# Patient Record
Sex: Female | Born: 1935
Health system: Southern US, Community
[De-identification: ages and names within clinical notes are randomized; demographics above are authoritative.]

## PROBLEM LIST (undated history)

## (undated) DIAGNOSIS — E785 Hyperlipidemia, unspecified: Secondary | ICD-10-CM

## (undated) DIAGNOSIS — N329 Bladder disorder, unspecified: Secondary | ICD-10-CM

## (undated) DIAGNOSIS — F329 Major depressive disorder, single episode, unspecified: Secondary | ICD-10-CM

## (undated) DIAGNOSIS — D649 Anemia, unspecified: Secondary | ICD-10-CM

## (undated) DIAGNOSIS — F418 Other specified anxiety disorders: Secondary | ICD-10-CM

## (undated) DIAGNOSIS — G252 Other specified forms of tremor: Secondary | ICD-10-CM

## (undated) DIAGNOSIS — I8390 Asymptomatic varicose veins of unspecified lower extremity: Secondary | ICD-10-CM

## (undated) DIAGNOSIS — I2699 Other pulmonary embolism without acute cor pulmonale: Secondary | ICD-10-CM

## (undated) DIAGNOSIS — M109 Gout, unspecified: Secondary | ICD-10-CM

## (undated) DIAGNOSIS — Z860100 Personal history of colon polyps, unspecified: Secondary | ICD-10-CM

## (undated) DIAGNOSIS — R0600 Dyspnea, unspecified: Secondary | ICD-10-CM

## (undated) DIAGNOSIS — R079 Chest pain, unspecified: Secondary | ICD-10-CM

## (undated) DIAGNOSIS — E039 Hypothyroidism, unspecified: Secondary | ICD-10-CM

## (undated) DIAGNOSIS — K648 Other hemorrhoids: Secondary | ICD-10-CM

## (undated) DIAGNOSIS — H3530 Unspecified macular degeneration: Secondary | ICD-10-CM

## (undated) DIAGNOSIS — F411 Generalized anxiety disorder: Secondary | ICD-10-CM

## (undated) DIAGNOSIS — R609 Edema, unspecified: Secondary | ICD-10-CM

## (undated) DIAGNOSIS — G250 Essential tremor: Secondary | ICD-10-CM

## (undated) DIAGNOSIS — K219 Gastro-esophageal reflux disease without esophagitis: Secondary | ICD-10-CM

## (undated) DIAGNOSIS — I5022 Chronic systolic (congestive) heart failure: Secondary | ICD-10-CM

## (undated) DIAGNOSIS — K5792 Diverticulitis of intestine, part unspecified, without perforation or abscess without bleeding: Secondary | ICD-10-CM

## (undated) DIAGNOSIS — M1990 Unspecified osteoarthritis, unspecified site: Secondary | ICD-10-CM

## (undated) DIAGNOSIS — F3289 Other specified depressive episodes: Secondary | ICD-10-CM

## (undated) DIAGNOSIS — Z8711 Personal history of peptic ulcer disease: Secondary | ICD-10-CM

## (undated) DIAGNOSIS — F419 Anxiety disorder, unspecified: Secondary | ICD-10-CM

## (undated) DIAGNOSIS — I10 Essential (primary) hypertension: Secondary | ICD-10-CM

## (undated) DIAGNOSIS — I82409 Acute embolism and thrombosis of unspecified deep veins of unspecified lower extremity: Secondary | ICD-10-CM

## (undated) DIAGNOSIS — R7309 Other abnormal glucose: Secondary | ICD-10-CM

## (undated) HISTORY — DX: Other hemorrhoids: K64.8

## (undated) HISTORY — DX: Hyperlipidemia, unspecified: E78.5

## (undated) HISTORY — DX: Other specified depressive episodes: F32.89

## (undated) HISTORY — DX: Other specified forms of tremor: G25.0

## (undated) HISTORY — DX: Generalized anxiety disorder: F41.1

## (undated) HISTORY — DX: Major depressive disorder, single episode, unspecified: F32.9

## (undated) HISTORY — PX: CHOLECYSTECTOMY: SHX55

## (undated) HISTORY — DX: Diverticulitis of intestine, part unspecified, without perforation or abscess without bleeding: K57.92

## (undated) HISTORY — DX: Gastro-esophageal reflux disease without esophagitis: K21.9

## (undated) HISTORY — DX: Chronic systolic (congestive) heart failure: I50.22

## (undated) HISTORY — PX: KNEE ARTHROSCOPY: SHX127

## (undated) HISTORY — PX: NASAL SEPTUM SURGERY: SHX37

## (undated) HISTORY — DX: Unspecified osteoarthritis, unspecified site: M19.90

## (undated) HISTORY — PX: SHOULDER SURGERY: SHX246

## (undated) HISTORY — DX: Other pulmonary embolism without acute cor pulmonale: I26.99

## (undated) HISTORY — DX: Gout, unspecified: M10.9

## (undated) HISTORY — DX: Essential (primary) hypertension: I10

## (undated) HISTORY — DX: Edema, unspecified: R60.9

## (undated) HISTORY — DX: Asymptomatic varicose veins of unspecified lower extremity: I83.90

## (undated) HISTORY — PX: EYE SURGERY: SHX253

## (undated) HISTORY — PX: ABDOMINAL HYSTERECTOMY: SHX81

## (undated) HISTORY — DX: Personal history of colon polyps, unspecified: Z86.0100

## (undated) HISTORY — DX: Chest pain, unspecified: R07.9

## (undated) HISTORY — DX: Hypothyroidism, unspecified: E03.9

## (undated) HISTORY — DX: Essential tremor: G25.2

## (undated) HISTORY — DX: Personal history of peptic ulcer disease: Z87.11

## (undated) HISTORY — DX: Acute embolism and thrombosis of unspecified deep veins of unspecified lower extremity: I82.409

## (undated) HISTORY — DX: Personal history of colonic polyps: Z86.010

## (undated) HISTORY — DX: Anxiety disorder, unspecified: F41.9

## (undated) HISTORY — DX: Anemia, unspecified: D64.9

## (undated) HISTORY — DX: Other abnormal glucose: R73.09

## (undated) HISTORY — DX: Bladder disorder, unspecified: N32.9

## (undated) HISTORY — DX: Unspecified macular degeneration: H35.30

## (undated) HISTORY — DX: Other specified anxiety disorders: F41.8

## (undated) HISTORY — DX: Dyspnea, unspecified: R06.00

---

## 1999-05-24 ENCOUNTER — Encounter: Payer: Self-pay | Admitting: *Deleted

## 1999-05-24 ENCOUNTER — Emergency Department (HOSPITAL_COMMUNITY): Admission: EM | Admit: 1999-05-24 | Discharge: 1999-05-24 | Payer: Self-pay | Admitting: *Deleted

## 2001-02-11 ENCOUNTER — Other Ambulatory Visit: Admission: RE | Admit: 2001-02-11 | Discharge: 2001-02-11 | Payer: Self-pay | Admitting: Obstetrics and Gynecology

## 2003-01-19 ENCOUNTER — Encounter (INDEPENDENT_AMBULATORY_CARE_PROVIDER_SITE_OTHER): Payer: Self-pay | Admitting: Gastroenterology

## 2003-11-12 ENCOUNTER — Encounter: Payer: Self-pay | Admitting: Internal Medicine

## 2004-03-11 ENCOUNTER — Encounter: Admission: RE | Admit: 2004-03-11 | Discharge: 2004-03-11 | Payer: Self-pay | Admitting: Orthopedic Surgery

## 2004-09-01 ENCOUNTER — Ambulatory Visit: Payer: Self-pay | Admitting: Internal Medicine

## 2004-09-21 ENCOUNTER — Ambulatory Visit: Payer: Self-pay | Admitting: Internal Medicine

## 2004-10-10 ENCOUNTER — Ambulatory Visit: Payer: Self-pay | Admitting: Internal Medicine

## 2004-11-14 ENCOUNTER — Ambulatory Visit: Payer: Self-pay | Admitting: Internal Medicine

## 2004-12-01 ENCOUNTER — Other Ambulatory Visit: Admission: RE | Admit: 2004-12-01 | Discharge: 2004-12-01 | Payer: Self-pay | Admitting: Obstetrics and Gynecology

## 2004-12-11 ENCOUNTER — Ambulatory Visit: Payer: Self-pay | Admitting: Internal Medicine

## 2004-12-12 ENCOUNTER — Ambulatory Visit: Payer: Self-pay

## 2005-01-02 ENCOUNTER — Ambulatory Visit: Payer: Self-pay | Admitting: Internal Medicine

## 2005-01-09 ENCOUNTER — Ambulatory Visit: Payer: Self-pay | Admitting: Internal Medicine

## 2005-04-03 ENCOUNTER — Ambulatory Visit: Payer: Self-pay | Admitting: Internal Medicine

## 2005-04-06 ENCOUNTER — Ambulatory Visit: Payer: Self-pay | Admitting: Internal Medicine

## 2005-05-18 ENCOUNTER — Encounter: Admission: RE | Admit: 2005-05-18 | Discharge: 2005-06-28 | Payer: Self-pay | Admitting: Orthopedic Surgery

## 2005-06-27 ENCOUNTER — Ambulatory Visit: Payer: Self-pay | Admitting: Internal Medicine

## 2005-08-30 ENCOUNTER — Ambulatory Visit: Payer: Self-pay | Admitting: Internal Medicine

## 2006-02-25 ENCOUNTER — Ambulatory Visit: Payer: Self-pay | Admitting: Internal Medicine

## 2006-05-09 ENCOUNTER — Ambulatory Visit: Payer: Self-pay | Admitting: Internal Medicine

## 2006-05-31 ENCOUNTER — Ambulatory Visit: Payer: Self-pay | Admitting: Internal Medicine

## 2006-08-08 ENCOUNTER — Ambulatory Visit: Payer: Self-pay | Admitting: Internal Medicine

## 2006-08-29 ENCOUNTER — Ambulatory Visit: Payer: Self-pay | Admitting: Internal Medicine

## 2007-01-14 ENCOUNTER — Ambulatory Visit: Payer: Self-pay | Admitting: Internal Medicine

## 2007-06-02 ENCOUNTER — Encounter: Payer: Self-pay | Admitting: Internal Medicine

## 2007-06-02 DIAGNOSIS — Z860100 Personal history of colon polyps, unspecified: Secondary | ICD-10-CM | POA: Insufficient documentation

## 2007-06-02 DIAGNOSIS — F418 Other specified anxiety disorders: Secondary | ICD-10-CM

## 2007-06-02 DIAGNOSIS — F4323 Adjustment disorder with mixed anxiety and depressed mood: Secondary | ICD-10-CM

## 2007-06-02 DIAGNOSIS — Z8719 Personal history of other diseases of the digestive system: Secondary | ICD-10-CM

## 2007-06-02 DIAGNOSIS — K219 Gastro-esophageal reflux disease without esophagitis: Secondary | ICD-10-CM

## 2007-06-02 DIAGNOSIS — K5730 Diverticulosis of large intestine without perforation or abscess without bleeding: Secondary | ICD-10-CM | POA: Insufficient documentation

## 2007-06-02 DIAGNOSIS — E079 Disorder of thyroid, unspecified: Secondary | ICD-10-CM | POA: Insufficient documentation

## 2007-06-02 DIAGNOSIS — E785 Hyperlipidemia, unspecified: Secondary | ICD-10-CM | POA: Insufficient documentation

## 2007-06-02 DIAGNOSIS — I10 Essential (primary) hypertension: Secondary | ICD-10-CM

## 2007-06-02 DIAGNOSIS — E039 Hypothyroidism, unspecified: Secondary | ICD-10-CM

## 2007-06-02 DIAGNOSIS — Z8711 Personal history of peptic ulcer disease: Secondary | ICD-10-CM

## 2007-06-02 DIAGNOSIS — E782 Mixed hyperlipidemia: Secondary | ICD-10-CM

## 2007-06-02 DIAGNOSIS — M1990 Unspecified osteoarthritis, unspecified site: Secondary | ICD-10-CM | POA: Insufficient documentation

## 2007-06-02 HISTORY — DX: Other specified anxiety disorders: F41.8

## 2007-08-06 ENCOUNTER — Ambulatory Visit: Payer: Self-pay | Admitting: Internal Medicine

## 2007-08-06 LAB — CONVERTED CEMR LAB
ALT: 14 units/L (ref 0–35)
AST: 27 units/L (ref 0–37)
Albumin: 4.1 g/dL (ref 3.5–5.2)
Basophils Absolute: 0 10*3/uL (ref 0.0–0.1)
Cholesterol: 199 mg/dL (ref 0–200)
Eosinophils Absolute: 0.3 10*3/uL (ref 0.0–0.6)
Eosinophils Relative: 4.4 % (ref 0.0–5.0)
GFR calc Af Amer: 63 mL/min
GFR calc non Af Amer: 52 mL/min
Glucose, Bld: 110 mg/dL — ABNORMAL HIGH (ref 70–99)
HCT: 37.5 % (ref 36.0–46.0)
HDL: 42.7 mg/dL (ref >39.0)
Hgb A1c MFr Bld: 6 % (ref 4.6–6.0)
LDL Cholesterol: 129 mg/dL — ABNORMAL HIGH (ref 0–99)
Lymphocytes Relative: 28.6 % (ref 12.0–46.0)
MCHC: 35 g/dL (ref 30.0–36.0)
MCV: 92.3 fL (ref 78.0–100.0)
Neutro Abs: 4.1 10*3/uL (ref 1.4–7.7)
Neutrophils Relative %: 59.7 % (ref 43.0–77.0)
Platelets: 215 10*3/uL (ref 150–400)
Potassium: 4.8 meq/L (ref 3.5–5.1)
RBC: 4.06 M/uL (ref 3.87–5.11)
Sodium: 144 meq/L (ref 135–145)
TSH: 1.79 microintl units/mL (ref 0.35–5.50)
Total Bilirubin: 0.9 mg/dL (ref 0.3–1.2)
Total CHOL/HDL Ratio: 4.7
Triglycerides: 137 mg/dL (ref 0–149)

## 2007-09-11 ENCOUNTER — Emergency Department (HOSPITAL_COMMUNITY): Admission: EM | Admit: 2007-09-11 | Discharge: 2007-09-11 | Payer: Self-pay | Admitting: Family Medicine

## 2007-12-03 ENCOUNTER — Encounter: Payer: Self-pay | Admitting: Internal Medicine

## 2008-04-01 ENCOUNTER — Ambulatory Visit: Payer: Self-pay | Admitting: Internal Medicine

## 2008-04-01 DIAGNOSIS — M109 Gout, unspecified: Secondary | ICD-10-CM

## 2008-04-02 ENCOUNTER — Ambulatory Visit: Payer: Self-pay | Admitting: Internal Medicine

## 2008-04-28 ENCOUNTER — Telehealth: Payer: Self-pay | Admitting: Internal Medicine

## 2008-04-29 ENCOUNTER — Ambulatory Visit: Payer: Self-pay | Admitting: Internal Medicine

## 2008-04-29 LAB — CONVERTED CEMR LAB
ALT: 14 units/L (ref 0–35)
BUN: 16 mg/dL (ref 6–23)
CRP, High Sensitivity: 4 (ref 0.00–5.00)
Chloride: 103 meq/L (ref 96–112)
Cholesterol: 194 mg/dL (ref 0–200)
Glucose, Bld: 109 mg/dL — ABNORMAL HIGH (ref 70–99)
LDL Cholesterol: 124 mg/dL — ABNORMAL HIGH (ref 0–99)
Potassium: 4 meq/L (ref 3.5–5.1)
TSH: 2.39 microintl units/mL (ref 0.35–5.50)
Triglycerides: 118 mg/dL (ref 0–149)
Uric Acid, Serum: 7.3 mg/dL — ABNORMAL HIGH (ref 2.4–7.0)
VLDL: 24 mg/dL (ref 0–40)

## 2008-05-02 ENCOUNTER — Encounter: Payer: Self-pay | Admitting: Internal Medicine

## 2008-05-25 ENCOUNTER — Ambulatory Visit: Payer: Self-pay | Admitting: Internal Medicine

## 2008-07-29 ENCOUNTER — Ambulatory Visit: Payer: Self-pay | Admitting: Internal Medicine

## 2008-08-05 ENCOUNTER — Encounter: Payer: Self-pay | Admitting: Internal Medicine

## 2008-09-16 ENCOUNTER — Telehealth: Payer: Self-pay | Admitting: Internal Medicine

## 2008-09-16 ENCOUNTER — Ambulatory Visit: Payer: Self-pay | Admitting: Internal Medicine

## 2008-09-16 DIAGNOSIS — J018 Other acute sinusitis: Secondary | ICD-10-CM

## 2008-09-30 ENCOUNTER — Encounter: Payer: Self-pay | Admitting: Internal Medicine

## 2008-10-18 ENCOUNTER — Telehealth: Payer: Self-pay | Admitting: Internal Medicine

## 2008-10-26 ENCOUNTER — Ambulatory Visit: Payer: Self-pay | Admitting: Internal Medicine

## 2008-10-26 LAB — CONVERTED CEMR LAB
ALT: 15 units/L (ref 0–35)
LDL Cholesterol: 37 mg/dL (ref 0–99)
Total CHOL/HDL Ratio: 2
Triglycerides: 71 mg/dL (ref 0–149)
Uric Acid, Serum: 5.3 mg/dL (ref 2.4–7.0)
VLDL: 14 mg/dL (ref 0–40)

## 2008-10-29 ENCOUNTER — Ambulatory Visit: Payer: Self-pay | Admitting: Internal Medicine

## 2008-10-29 DIAGNOSIS — G250 Essential tremor: Secondary | ICD-10-CM

## 2008-10-29 DIAGNOSIS — G252 Other specified forms of tremor: Secondary | ICD-10-CM

## 2008-11-09 ENCOUNTER — Telehealth (INDEPENDENT_AMBULATORY_CARE_PROVIDER_SITE_OTHER): Payer: Self-pay | Admitting: *Deleted

## 2008-11-11 ENCOUNTER — Telehealth: Payer: Self-pay | Admitting: Internal Medicine

## 2008-11-29 ENCOUNTER — Ambulatory Visit: Payer: Self-pay | Admitting: Gastroenterology

## 2008-12-24 ENCOUNTER — Ambulatory Visit: Payer: Self-pay | Admitting: Gastroenterology

## 2008-12-24 ENCOUNTER — Encounter: Payer: Self-pay | Admitting: Gastroenterology

## 2008-12-27 ENCOUNTER — Encounter: Payer: Self-pay | Admitting: Gastroenterology

## 2009-02-03 ENCOUNTER — Encounter: Payer: Self-pay | Admitting: Gastroenterology

## 2009-02-04 ENCOUNTER — Encounter: Payer: Self-pay | Admitting: Gastroenterology

## 2009-02-28 ENCOUNTER — Ambulatory Visit: Payer: Self-pay | Admitting: Internal Medicine

## 2009-02-28 DIAGNOSIS — I8310 Varicose veins of unspecified lower extremity with inflammation: Secondary | ICD-10-CM | POA: Insufficient documentation

## 2009-02-28 DIAGNOSIS — E119 Type 2 diabetes mellitus without complications: Secondary | ICD-10-CM

## 2009-02-28 LAB — CONVERTED CEMR LAB
BUN: 15 mg/dL (ref 6–23)
Creatinine, Ser: 1 mg/dL (ref 0.4–1.2)
GFR calc non Af Amer: 57.43 mL/min (ref >60)
Hgb A1c MFr Bld: 6.1 % (ref 4.6–6.5)
TSH: 1.25 microintl units/mL (ref 0.35–5.50)

## 2009-03-01 ENCOUNTER — Encounter: Payer: Self-pay | Admitting: Internal Medicine

## 2009-03-09 ENCOUNTER — Ambulatory Visit: Payer: Self-pay | Admitting: Vascular Surgery

## 2009-04-28 ENCOUNTER — Ambulatory Visit: Payer: Self-pay | Admitting: Internal Medicine

## 2009-04-28 DIAGNOSIS — N951 Menopausal and female climacteric states: Secondary | ICD-10-CM

## 2009-04-28 DIAGNOSIS — H5310 Unspecified subjective visual disturbances: Secondary | ICD-10-CM | POA: Insufficient documentation

## 2009-05-10 ENCOUNTER — Ambulatory Visit: Payer: Self-pay

## 2009-05-10 ENCOUNTER — Encounter: Payer: Self-pay | Admitting: Internal Medicine

## 2009-05-24 DIAGNOSIS — G2581 Restless legs syndrome: Secondary | ICD-10-CM | POA: Insufficient documentation

## 2009-05-25 ENCOUNTER — Ambulatory Visit: Payer: Self-pay | Admitting: Cardiology

## 2009-05-25 ENCOUNTER — Encounter: Payer: Self-pay | Admitting: Cardiology

## 2009-05-31 ENCOUNTER — Telehealth (INDEPENDENT_AMBULATORY_CARE_PROVIDER_SITE_OTHER): Payer: Self-pay

## 2009-06-01 ENCOUNTER — Ambulatory Visit: Payer: Self-pay

## 2009-06-02 ENCOUNTER — Ambulatory Visit: Payer: Self-pay

## 2009-06-09 ENCOUNTER — Ambulatory Visit: Payer: Self-pay | Admitting: Internal Medicine

## 2009-06-09 DIAGNOSIS — K648 Other hemorrhoids: Secondary | ICD-10-CM | POA: Insufficient documentation

## 2009-06-23 ENCOUNTER — Ambulatory Visit: Payer: Self-pay | Admitting: Pulmonary Disease

## 2009-07-12 ENCOUNTER — Encounter: Payer: Self-pay | Admitting: Pulmonary Disease

## 2009-07-12 ENCOUNTER — Ambulatory Visit (HOSPITAL_BASED_OUTPATIENT_CLINIC_OR_DEPARTMENT_OTHER): Admission: RE | Admit: 2009-07-12 | Discharge: 2009-07-12 | Payer: Self-pay | Admitting: Pulmonary Disease

## 2009-07-15 ENCOUNTER — Ambulatory Visit: Payer: Self-pay | Admitting: Pulmonary Disease

## 2009-07-19 ENCOUNTER — Ambulatory Visit: Payer: Self-pay | Admitting: Pulmonary Disease

## 2009-07-26 ENCOUNTER — Inpatient Hospital Stay (HOSPITAL_COMMUNITY): Admission: EM | Admit: 2009-07-26 | Discharge: 2009-07-30 | Payer: Self-pay | Admitting: Internal Medicine

## 2009-07-26 ENCOUNTER — Encounter: Payer: Self-pay | Admitting: Emergency Medicine

## 2009-07-26 ENCOUNTER — Ambulatory Visit: Payer: Self-pay | Admitting: Internal Medicine

## 2009-07-26 ENCOUNTER — Ambulatory Visit (HOSPITAL_BASED_OUTPATIENT_CLINIC_OR_DEPARTMENT_OTHER): Admission: RE | Admit: 2009-07-26 | Discharge: 2009-07-26 | Payer: Self-pay | Admitting: Internal Medicine

## 2009-07-26 ENCOUNTER — Ambulatory Visit: Payer: Self-pay | Admitting: Radiology

## 2009-07-26 DIAGNOSIS — R109 Unspecified abdominal pain: Secondary | ICD-10-CM | POA: Insufficient documentation

## 2009-07-26 LAB — CONVERTED CEMR LAB
Bilirubin Urine: NEGATIVE
Nitrite: NEGATIVE
Protein, U semiquant: >=300
Specific Gravity, Urine: 1.015
WBC Urine, dipstick: NEGATIVE
pH: 7

## 2009-07-28 ENCOUNTER — Ambulatory Visit: Payer: Self-pay | Admitting: Vascular Surgery

## 2009-07-28 ENCOUNTER — Encounter (INDEPENDENT_AMBULATORY_CARE_PROVIDER_SITE_OTHER): Payer: Self-pay | Admitting: Internal Medicine

## 2009-07-28 ENCOUNTER — Encounter: Payer: Self-pay | Admitting: Internal Medicine

## 2009-07-31 ENCOUNTER — Telehealth: Payer: Self-pay | Admitting: Internal Medicine

## 2009-07-31 DIAGNOSIS — I2699 Other pulmonary embolism without acute cor pulmonale: Secondary | ICD-10-CM

## 2009-08-01 ENCOUNTER — Ambulatory Visit: Payer: Self-pay | Admitting: Internal Medicine

## 2009-08-01 DIAGNOSIS — M25569 Pain in unspecified knee: Secondary | ICD-10-CM | POA: Insufficient documentation

## 2009-08-01 LAB — CONVERTED CEMR LAB: INR: 2.7 — ABNORMAL HIGH (ref 0.0–1.5)

## 2009-08-02 ENCOUNTER — Ambulatory Visit: Payer: Self-pay | Admitting: Cardiology

## 2009-08-02 ENCOUNTER — Telehealth: Payer: Self-pay | Admitting: Internal Medicine

## 2009-08-02 LAB — CONVERTED CEMR LAB: POC INR: 4.8

## 2009-08-05 ENCOUNTER — Ambulatory Visit: Payer: Self-pay | Admitting: Pulmonary Disease

## 2009-08-05 DIAGNOSIS — I2699 Other pulmonary embolism without acute cor pulmonale: Secondary | ICD-10-CM | POA: Insufficient documentation

## 2009-08-08 ENCOUNTER — Ambulatory Visit: Payer: Self-pay | Admitting: Cardiology

## 2009-08-08 LAB — CONVERTED CEMR LAB: POC INR: 4.4

## 2009-08-15 ENCOUNTER — Ambulatory Visit: Payer: Self-pay | Admitting: Internal Medicine

## 2009-08-15 LAB — CONVERTED CEMR LAB: POC INR: 5

## 2009-08-22 ENCOUNTER — Ambulatory Visit: Payer: Self-pay | Admitting: Internal Medicine

## 2009-08-29 ENCOUNTER — Ambulatory Visit: Payer: Self-pay | Admitting: Cardiovascular Disease

## 2009-09-01 ENCOUNTER — Telehealth: Payer: Self-pay | Admitting: Internal Medicine

## 2009-09-01 ENCOUNTER — Ambulatory Visit: Payer: Self-pay | Admitting: Cardiovascular Disease

## 2009-09-12 ENCOUNTER — Ambulatory Visit: Payer: Self-pay | Admitting: Cardiology

## 2009-09-12 LAB — CONVERTED CEMR LAB: POC INR: 2.3

## 2009-10-04 ENCOUNTER — Ambulatory Visit: Payer: Self-pay | Admitting: Internal Medicine

## 2009-10-04 DIAGNOSIS — J209 Acute bronchitis, unspecified: Secondary | ICD-10-CM

## 2009-10-05 ENCOUNTER — Encounter: Payer: Self-pay | Admitting: Internal Medicine

## 2009-10-10 ENCOUNTER — Ambulatory Visit: Payer: Self-pay | Admitting: Cardiology

## 2009-10-11 ENCOUNTER — Telehealth: Payer: Self-pay | Admitting: Internal Medicine

## 2009-10-13 ENCOUNTER — Ambulatory Visit: Payer: Self-pay | Admitting: Internal Medicine

## 2009-10-13 ENCOUNTER — Ambulatory Visit: Payer: Self-pay | Admitting: Diagnostic Radiology

## 2009-10-13 ENCOUNTER — Ambulatory Visit (HOSPITAL_BASED_OUTPATIENT_CLINIC_OR_DEPARTMENT_OTHER): Admission: RE | Admit: 2009-10-13 | Discharge: 2009-10-13 | Payer: Self-pay | Admitting: Internal Medicine

## 2009-10-19 ENCOUNTER — Telehealth: Payer: Self-pay | Admitting: Internal Medicine

## 2009-11-09 ENCOUNTER — Ambulatory Visit: Payer: Self-pay | Admitting: Cardiology

## 2009-11-14 ENCOUNTER — Ambulatory Visit: Payer: Self-pay | Admitting: Diagnostic Radiology

## 2009-11-14 ENCOUNTER — Ambulatory Visit: Payer: Self-pay | Admitting: Internal Medicine

## 2009-11-14 ENCOUNTER — Ambulatory Visit (HOSPITAL_BASED_OUTPATIENT_CLINIC_OR_DEPARTMENT_OTHER): Admission: RE | Admit: 2009-11-14 | Discharge: 2009-11-14 | Payer: Self-pay | Admitting: Internal Medicine

## 2009-11-14 DIAGNOSIS — S2020XA Contusion of thorax, unspecified, initial encounter: Secondary | ICD-10-CM | POA: Insufficient documentation

## 2009-11-15 ENCOUNTER — Telehealth: Payer: Self-pay | Admitting: Internal Medicine

## 2009-11-15 ENCOUNTER — Encounter: Payer: Self-pay | Admitting: Internal Medicine

## 2009-12-01 ENCOUNTER — Telehealth: Payer: Self-pay | Admitting: Internal Medicine

## 2009-12-07 ENCOUNTER — Ambulatory Visit: Payer: Self-pay | Admitting: Cardiology

## 2009-12-08 ENCOUNTER — Ambulatory Visit: Payer: Self-pay | Admitting: Diagnostic Radiology

## 2009-12-08 ENCOUNTER — Ambulatory Visit: Payer: Self-pay | Admitting: Internal Medicine

## 2009-12-08 ENCOUNTER — Ambulatory Visit (HOSPITAL_BASED_OUTPATIENT_CLINIC_OR_DEPARTMENT_OTHER): Admission: RE | Admit: 2009-12-08 | Discharge: 2009-12-08 | Payer: Self-pay | Admitting: Internal Medicine

## 2009-12-08 DIAGNOSIS — R4589 Other symptoms and signs involving emotional state: Secondary | ICD-10-CM

## 2009-12-08 DIAGNOSIS — R197 Diarrhea, unspecified: Secondary | ICD-10-CM

## 2009-12-09 ENCOUNTER — Telehealth (INDEPENDENT_AMBULATORY_CARE_PROVIDER_SITE_OTHER): Payer: Self-pay | Admitting: *Deleted

## 2009-12-12 ENCOUNTER — Ambulatory Visit: Payer: Self-pay | Admitting: Cardiology

## 2009-12-12 LAB — CONVERTED CEMR LAB: POC INR: 1.8

## 2009-12-14 ENCOUNTER — Encounter: Payer: Self-pay | Admitting: Internal Medicine

## 2009-12-16 ENCOUNTER — Telehealth: Payer: Self-pay | Admitting: Internal Medicine

## 2009-12-20 ENCOUNTER — Encounter: Payer: Self-pay | Admitting: Internal Medicine

## 2009-12-20 ENCOUNTER — Ambulatory Visit: Payer: Self-pay | Admitting: Psychiatry

## 2009-12-21 ENCOUNTER — Ambulatory Visit: Payer: Self-pay | Admitting: Cardiology

## 2009-12-21 LAB — CONVERTED CEMR LAB: POC INR: 1.7

## 2010-01-04 ENCOUNTER — Ambulatory Visit: Payer: Self-pay | Admitting: Internal Medicine

## 2010-01-19 ENCOUNTER — Ambulatory Visit: Payer: Self-pay | Admitting: Internal Medicine

## 2010-01-19 DIAGNOSIS — G629 Polyneuropathy, unspecified: Secondary | ICD-10-CM

## 2010-01-19 LAB — CONVERTED CEMR LAB
AST: 27 units/L (ref 0–37)
Albumin: 4.1 g/dL (ref 3.5–5.2)
BUN: 19 mg/dL (ref 6–23)
Calcium: 9.8 mg/dL (ref 8.4–10.5)
Creatinine, Ser: 0.92 mg/dL (ref 0.40–1.20)
Glucose, Bld: 87 mg/dL (ref 70–99)
HCT: 33.8 % — ABNORMAL LOW (ref 36.0–46.0)
Hemoglobin: 11.1 g/dL — ABNORMAL LOW (ref 12.0–15.0)
Hgb A1c MFr Bld: 5.9 % (ref 4.6–6.1)
MCHC: 32.8 g/dL (ref 30.0–36.0)
MCV: 96.6 fL (ref 78.0–100.0)
RBC: 3.5 M/uL — ABNORMAL LOW (ref 3.87–5.11)
RDW: 14.9 % (ref 11.5–15.5)
TSH: 1.697 microintl units/mL (ref 0.350–4.500)
Total Bilirubin: 0.4 mg/dL (ref 0.3–1.2)
Vitamin B-12: 884 pg/mL (ref 211–911)

## 2010-01-20 ENCOUNTER — Telehealth: Payer: Self-pay | Admitting: Internal Medicine

## 2010-01-24 ENCOUNTER — Telehealth: Payer: Self-pay | Admitting: Internal Medicine

## 2010-01-25 ENCOUNTER — Ambulatory Visit: Payer: Self-pay | Admitting: Internal Medicine

## 2010-01-25 ENCOUNTER — Telehealth: Payer: Self-pay | Admitting: Internal Medicine

## 2010-01-25 LAB — CONVERTED CEMR LAB: POC INR: 1.4

## 2010-01-31 ENCOUNTER — Ambulatory Visit: Payer: Self-pay | Admitting: Internal Medicine

## 2010-01-31 LAB — CONVERTED CEMR LAB
OCCULT 1: NEGATIVE
OCCULT 4: NEGATIVE

## 2010-02-03 ENCOUNTER — Encounter: Payer: Self-pay | Admitting: Internal Medicine

## 2010-02-09 ENCOUNTER — Ambulatory Visit: Payer: Self-pay | Admitting: Pulmonary Disease

## 2010-02-09 ENCOUNTER — Telehealth: Payer: Self-pay | Admitting: Internal Medicine

## 2010-02-10 ENCOUNTER — Ambulatory Visit: Payer: Self-pay | Admitting: Internal Medicine

## 2010-02-24 ENCOUNTER — Ambulatory Visit: Payer: Self-pay | Admitting: Internal Medicine

## 2010-02-24 LAB — CONVERTED CEMR LAB: POC INR: 1.6

## 2010-03-10 ENCOUNTER — Ambulatory Visit: Payer: Self-pay | Admitting: Cardiology

## 2010-03-24 ENCOUNTER — Ambulatory Visit: Payer: Self-pay | Admitting: Cardiology

## 2010-03-24 LAB — CONVERTED CEMR LAB: POC INR: 1.8

## 2010-04-06 ENCOUNTER — Encounter: Payer: Self-pay | Admitting: Internal Medicine

## 2010-04-07 ENCOUNTER — Ambulatory Visit: Payer: Self-pay | Admitting: Cardiology

## 2010-04-07 LAB — CONVERTED CEMR LAB: POC INR: 2.2

## 2010-05-01 ENCOUNTER — Ambulatory Visit: Payer: Self-pay | Admitting: Cardiology

## 2010-05-01 LAB — CONVERTED CEMR LAB: POC INR: 3.2

## 2010-05-03 ENCOUNTER — Encounter: Payer: Self-pay | Admitting: Internal Medicine

## 2010-05-09 ENCOUNTER — Encounter: Payer: Self-pay | Admitting: Internal Medicine

## 2010-05-09 DIAGNOSIS — I739 Peripheral vascular disease, unspecified: Secondary | ICD-10-CM

## 2010-05-10 ENCOUNTER — Ambulatory Visit: Payer: Self-pay

## 2010-05-10 ENCOUNTER — Encounter: Payer: Self-pay | Admitting: Internal Medicine

## 2010-05-19 ENCOUNTER — Ambulatory Visit: Payer: Self-pay | Admitting: Cardiology

## 2010-06-02 ENCOUNTER — Ambulatory Visit: Payer: Self-pay | Admitting: Cardiology

## 2010-06-02 LAB — CONVERTED CEMR LAB: POC INR: 5.4

## 2010-06-09 ENCOUNTER — Ambulatory Visit: Payer: Self-pay | Admitting: Cardiology

## 2010-06-09 LAB — CONVERTED CEMR LAB: POC INR: 2.1

## 2010-06-21 ENCOUNTER — Encounter: Payer: Self-pay | Admitting: Internal Medicine

## 2010-06-23 ENCOUNTER — Ambulatory Visit: Payer: Self-pay | Admitting: Internal Medicine

## 2010-06-23 LAB — CONVERTED CEMR LAB: POC INR: 2.9

## 2010-07-18 ENCOUNTER — Ambulatory Visit: Payer: Self-pay | Admitting: Internal Medicine

## 2010-07-21 ENCOUNTER — Encounter: Payer: Self-pay | Admitting: Cardiology

## 2010-07-21 ENCOUNTER — Ambulatory Visit: Payer: Self-pay | Admitting: Internal Medicine

## 2010-07-24 LAB — CONVERTED CEMR LAB
BUN: 17 mg/dL
CO2: 26 meq/L
Calcium: 9.5 mg/dL
Chloride: 103 meq/L
Creatinine, Ser: 1.02 mg/dL
Glucose, Bld: 98 mg/dL
Hgb A1c MFr Bld: 6 % — ABNORMAL HIGH
Potassium: 4.2 meq/L
Sodium: 142 meq/L
TSH: 1.451 u[IU]/mL

## 2010-07-31 LAB — CONVERTED CEMR LAB: Pap Smear: NORMAL

## 2010-09-20 ENCOUNTER — Encounter: Payer: Self-pay | Admitting: Internal Medicine

## 2010-10-19 ENCOUNTER — Ambulatory Visit (HOSPITAL_BASED_OUTPATIENT_CLINIC_OR_DEPARTMENT_OTHER)
Admission: RE | Admit: 2010-10-19 | Discharge: 2010-10-19 | Payer: Self-pay | Source: Home / Self Care | Admitting: Internal Medicine

## 2010-10-19 ENCOUNTER — Encounter: Payer: Self-pay | Admitting: Internal Medicine

## 2010-10-19 ENCOUNTER — Telehealth: Payer: Self-pay | Admitting: Internal Medicine

## 2010-10-19 ENCOUNTER — Ambulatory Visit: Payer: Self-pay | Admitting: Internal Medicine

## 2010-10-19 DIAGNOSIS — H8110 Benign paroxysmal vertigo, unspecified ear: Secondary | ICD-10-CM

## 2010-10-19 DIAGNOSIS — D649 Anemia, unspecified: Secondary | ICD-10-CM

## 2010-10-19 DIAGNOSIS — J019 Acute sinusitis, unspecified: Secondary | ICD-10-CM

## 2010-10-19 LAB — CONVERTED CEMR LAB
BUN: 21 mg/dL (ref 6–23)
Calcium: 10.6 mg/dL — ABNORMAL HIGH (ref 8.4–10.5)
Creatinine, Ser: 1.02 mg/dL (ref 0.40–1.20)
HCT: 40.8 % (ref 36.0–46.0)
Hgb A1c MFr Bld: 5.8 % — ABNORMAL HIGH (ref <5.7)
MCV: 95.8 fL (ref 78.0–100.0)
Platelets: 249 10*3/uL (ref 150–400)
RDW: 13.1 % (ref 11.5–15.5)
TSH: 1.987 microintl units/mL (ref 0.350–4.500)

## 2010-10-20 ENCOUNTER — Ambulatory Visit: Payer: Self-pay | Admitting: Internal Medicine

## 2010-10-20 ENCOUNTER — Telehealth: Payer: Self-pay | Admitting: Internal Medicine

## 2010-10-20 ENCOUNTER — Encounter: Payer: Self-pay | Admitting: Family Medicine

## 2010-10-27 ENCOUNTER — Ambulatory Visit: Payer: Self-pay | Admitting: Family Medicine

## 2010-10-28 ENCOUNTER — Ambulatory Visit (HOSPITAL_BASED_OUTPATIENT_CLINIC_OR_DEPARTMENT_OTHER)
Admission: RE | Admit: 2010-10-28 | Discharge: 2010-10-28 | Payer: Self-pay | Source: Home / Self Care | Attending: Family Medicine | Admitting: Family Medicine

## 2010-11-07 ENCOUNTER — Ambulatory Visit
Admission: RE | Admit: 2010-11-07 | Discharge: 2010-11-07 | Payer: Self-pay | Source: Home / Self Care | Attending: Family | Admitting: Family

## 2010-11-07 DIAGNOSIS — N390 Urinary tract infection, site not specified: Secondary | ICD-10-CM | POA: Insufficient documentation

## 2010-11-07 DIAGNOSIS — N329 Bladder disorder, unspecified: Secondary | ICD-10-CM | POA: Insufficient documentation

## 2010-11-07 LAB — CONVERTED CEMR LAB
Bilirubin Urine: NEGATIVE
Glucose, Urine, Semiquant: NEGATIVE
Ketones, urine, test strip: NEGATIVE
Specific Gravity, Urine: 1.01
pH: 6.5

## 2010-11-20 ENCOUNTER — Encounter
Admission: RE | Admit: 2010-11-20 | Discharge: 2010-12-12 | Payer: Self-pay | Source: Home / Self Care | Attending: Internal Medicine | Admitting: Internal Medicine

## 2010-11-29 ENCOUNTER — Telehealth: Payer: Self-pay | Admitting: Internal Medicine

## 2010-11-30 ENCOUNTER — Encounter: Payer: Self-pay | Admitting: Internal Medicine

## 2010-12-12 NOTE — Letter (Signed)
Summary: [facility]  [facility]   Imported By: Lanelle Bal 07/05/2010 10:12:28  _____________________________________________________________________  External Attachment:    Type:   Image     Comment:   External Document

## 2010-12-12 NOTE — Letter (Signed)
Summary: [facility]  [facility]   Imported By: Lanelle Bal 06/09/2010 13:27:47  _____________________________________________________________________  External Attachment:    Type:   Image     Comment:   External Document

## 2010-12-12 NOTE — Assessment & Plan Note (Signed)
Summary: 6 month follow up-jwr   Visit Type:  Follow-up Copy to:  Dr. Artist Pais Primary Provider/Referring Provider:  [name] DO  CC:  Pt here for 6 month follow up.  History of Present Illness: [name], ex smoker  for FU of PE 9/10 & restless legs. PSG >> RERAs are significant but does not meet AHI criteria for intervention. Significant PLM arousal index of 22/h , this corelates with her daytime h/s/o restless legs.  August 05, 2009  Admitted for RLL PE after air trip to [place]. Now on coumadin. Ropinirole helped. RT lower chest pain is impoved. C/o LLE nagging sensation, no swelling, duplex neg. Breathing OK.  18 lb wt gain over last few yr, 2 cups coffee daily.  2010-03-08 3:08 PM  daughter died of aneurysm December 19, 2022, on alprazolam Saw neurologist due to tremors - on primidone c/o dizzy - wonders if due to effexor Has increased energy now Hypercoagulable wu reviewed >> prot C, prot S neg,homocysteine nml, factor V neg, lupus anticoag POS   Current Medications (verified): 1)  Prevacid 30 Mg  Cpdr (Lansoprazole) .... Take 1 Tablet By Mouth Once A Day 2)  Furosemide 40 Mg  Tabs (Furosemide) .... Take 1 Tablet By Mouth Once A Day As Directed 3)  Synthroid 50 Mcg  Tabs (Levothyroxine Sodium) .... Take 1 Tablet By Mouth Once A Day 4)  Toprol Xl 50 Mg  Tb24 (Metoprolol Succinate) .... Take 1 Tablet By Mouth Once A Day 5)  Cozaar 100 Mg  Tabs (Losartan Potassium) .... Take 1 Tablet By Mouth Once A Day 6)  Astelin 137 Mcg/spray  Soln (Azelastine Hcl) .... One Spray Each Nostril Once Daily As Needed 7)  Simvastatin 40 Mg Tabs (Simvastatin) .... One By Mouth Qhs 8)  Uloric 40 Mg Tabs (Febuxostat) .... One By Mouth Once Daily 9)  Effexor Xr 75 Mg Xr24h-Cap (Venlafaxine Hcl) .... Take 1 Capsule By Mouth Once A Day 10)  Calcium Carbonate-Vitamin D 600-400 Mg-Unit  Tabs (Calcium Carbonate-Vitamin D) .... Take 1 Tablet By Mouth Once A Day 11)  Colchicine 0.6 Mg Tabs (Colchicine) .... As  Needed 12)  Vitamin D 2.000 Units .... Once Daily 13)  Pataday 0.2 % Soln (Olopatadine Hcl) .... One Drop Each Eye Once Daily 14)  Mucinex Dm Maximum Strength 60-1200 Mg Xr12h-Tab (Dextromethorphan-Guaifenesin) .... Take 1 Tablet By Mouth Two Times A Day As Needed 15)  Senokot S 8.6-50 Mg Tabs (Sennosides-Docusate Sodium) .... Take 1-2 Tablets By Mouth Two Times A Day As Needed 16)  Warfarin Sodium 5 Mg Tabs (Warfarin Sodium) .... Use As Directed By Anticoagulation Clinic 17)  Dulera 200-5 Mcg/act Aero (Mometasone Furo-Formoterol Fum) .... 2 Puffs Two Times A Day As Needed 18)  Alprazolam 0.25 Mg Tabs (Alprazolam) .... One Tab By Mouth Bid As Needed 19)  Gabapentin 100 Mg Caps (Gabapentin) .... One To Two Caps One Daily At Bedtime 20)  Primidone 50 Mg Tabs (Primidone) .... Take 1 Tablet By Mouth Two Times A Day  Allergies (verified): No Known Drug Allergies  Past History:  Past Medical History: Last updated: 01/19/2010 Current Problems:  HYPERTENSION (ICD-401.9) HYPOTHYROIDISM (ICD-244.9)   HYPERLIPIDEMIA (ICD-272.4) ? H/O CAD (cath in 2001)     HOT FLASHES (ICD-627.2)  DYSPNEA ON EXERTION (ICD-786.09)  UNSPECIFIED SUBJECTIVE VISUAL DISTURBANCE (ICD-368.10) VARICOSE VEINS LOWER EXTREMITIES W/INFLAMMATION (ICD-454.1) TREMOR, ESSENTIAL (ICD-333.1) RHINOSINUSITIS, ACUTE (ICD-461.8) GOUT (ICD-274.9) OSTEOARTHRITIS, HX OF (ICD-V13.5) HX, PERSONAL, PEPTIC ULCER DISEASE (ICD-V12.71) DIVERTICULOSIS, COLON (ICD-562.10) ANXIETY (ICD-300.00) COLONIC POLYPS, HX OF (  ICD-V12.72) GERD (ICD-530.81) DIVERTICULITIS, HX OF (ICD-V12.79) DEPRESSION (ICD-311) DIABETES MELLITUS, TYPE II, BORDERLINE (ICD-790.29) RESTLESS LEG SYNDROME (ICD-333.94) hyperplastic colon polyp, 2004; another colon polyp removed 2009 by Dr. in [place] unclear pathology Pulomonary Embolism- 07/2009  Social History: Last updated: 01/19/2010 Single Widowed     Alcohol use-yes     Tobacco Use - Former, started age  36, quit 1990, max 1 PPD     Review of Systems  The patient denies anorexia, fever, weight loss, weight gain, vision loss, decreased hearing, hoarseness, chest pain, syncope, dyspnea on exertion, peripheral edema, prolonged cough, headaches, hemoptysis, abdominal pain, melena, hematochezia, severe indigestion/heartburn, hematuria, muscle weakness, difficulty walking, depression, unusual weight change, and abnormal bleeding.    Vital Signs:  Patient profile:   77 year old female O2 Sat:      94 % on Room air Temp:     98.2 degrees F oral Pulse rate:   76 / minute BP sitting:   140 / 70  (left arm) Cuff size:   large  Vitals Entered By: Zackery Barefoot CMA (February 09, 2010 2:52 PM)  O2 Flow:  Room air CC: Pt here for 6 month follow up Comments Medications reviewed with patient Verified contact number and pharmacy with patient Zackery Barefoot CMA  February 09, 2010 2:53 PM    Physical Exam  Additional Exam:  Gen. Pleasant, well-nourished, in no distress ENT - no lesions, no post nasal drip Neck: No JVD, no thyromegaly, no carotid bruits Lungs: no use of accessory muscles, no dullness to percussion, clear without rales or rhonchi  Cardiovascular: Rhythm regular, heart sounds  normal, no murmurs or gallops, no peripheral edema Musculoskeletal: No deformities, no cyanosis or clubbing      Impression & Recommendations:  Problem # 1:  PULMONARY EMBOLISM (ICD-415.1)  Unclear what the optimal duration of anticoagulation is for her. Seemed idiopathic at onset except for plane ride from [place] , POS lupus anticoagulant noted. Wil ct coumadin for 12 months & then reassess. one approach would be tot ake her off coumadin x 1 month & chk D-dimer & rechk lupus anticoagulant. Her updated medication list for this problem includes:    Warfarin Sodium 5 Mg Tabs (Warfarin sodium) ..... Use as directed by anticoagulation clinic  Orders: Est. Patient Level III (82956)  Problem # 2:  RESTLESS LEG  SYNDROME (ICD-333.94)  ct requip  Orders: Est. Patient Level III (21308)  Problem # 3:  STRESS REACTION, ACUTE, WITH EMOTIONAL DISTURBANCE (ICD-308.0)  Wonders if effexor could be causing dizziness. have asked her to stop this & d/w Dr Artist Pais.  Orders: Est. Patient Level III (65784)  Medications Added to Medication List This Visit: 1)  Prevacid 30 Mg Cpdr (Lansoprazole) .... Take 1 tablet by mouth once a day 2)  Dulera 200-5 Mcg/act Aero (Mometasone furo-formoterol fum) .... 2 puffs two times a day as needed 3)  Primidone 50 Mg Tabs (Primidone) .... Take 1 tablet by mouth two times a day  Patient Instructions: 1)  Copy sent to: Dr Artist Pais. 2)  Stay on coumadin another 6months 3)  Please schedule a follow-up appointment in 6 months to reassess

## 2010-12-12 NOTE — Medication Information (Signed)
Summary: rov/tm  Anticoagulant Therapy  Managed by: Bethena Midget, RN, BSN Referring MD: Dr. Velta Addison PCP: [name] DO Supervising MD: Graciela Husbands MD, Viviann Spare Indication 1: Pulmonary Embolism Lab Used: [facility]  Site: [address] INR POC 1.6 INR RANGE 2-3  Dietary changes: no    Health status changes: no    Bleeding/hemorrhagic complications: no    Recent/future hospitalizations: no    Any changes in medication regimen? no    Recent/future dental: no  Any missed doses?: no       Is patient compliant with meds? yes       Allergies: No Known Drug Allergies  Anticoagulation Management History:      The patient is taking warfarin and comes in today for a routine follow up visit.  Positive risk factors for bleeding include an age of 74 years or older.  The bleeding index is 'intermediate risk'.  Positive CHADS2 values include History of HTN and Age > 84 years old.  Her last INR was 2.7.  Anticoagulation responsible provider: Graciela Husbands MD, Viviann Spare.  INR POC: 1.6.  Cuvette Lot#: [id number].  Exp: 03/2011.    Anticoagulation Management Assessment/Plan:      The patient's current anticoagulation dose is Warfarin sodium 5 mg tabs: Use as directed by Anticoagulation Clinic.  The target INR is 2.0-3.0.  The next INR is due 03/10/2010.  Anticoagulation instructions were given to patient.  Results were reviewed/authorized by Bethena Midget, RN, BSN.  She was notified by Bethena Midget, RN, BSN.         Prior Anticoagulation Instructions: INR 1.3 Today take 1 1/2 pills then change dose to 1/2 pill everyday except 1 pill on Mondays, Wednesdays and Fridays. Recheck in 2 weeks.  Current Anticoagulation Instructions: INR 1.6 Today take 7.5mg s then change dose to 5mg s daily except 2.5mg s on T, T& Sun. Recheck in 2 weeks.

## 2010-12-12 NOTE — Medication Information (Signed)
Summary: rov/tm  Anticoagulant Therapy  Managed by: Bethena Midget, RN Referring MD: Dr. Velta Addison PCP: [name] DO Supervising MD: Juanda Chance MD,  [place] Indication 1: Pulmonary Embolism Lab Used: [facility] [place] Site: [address] INR POC 4.0 INR RANGE 2-3  Dietary changes: yes       Details: Has been unable to eat regular meals lately due to family circumstances.  Health status changes: no    Bleeding/hemorrhagic complications: no    Recent/future hospitalizations: no    Any changes in medication regimen? no    Recent/future dental: no  Any missed doses?: no       Is patient compliant with meds? yes       Allergies: No Known Drug Allergies  Anticoagulation Management History:      The patient is taking warfarin and comes in today for a routine follow up visit.  Positive risk factors for bleeding include an age of 30 years or older.  The bleeding index is 'intermediate risk'.  Positive CHADS2 values include History of HTN and Age > 46 years old.  Her last INR was 2.7.  Anticoagulation responsible provider: Juanda Chance MD, Smitty Cords.  INR POC: 4.0.  Cuvette Lot#: [id number].  Exp: 02/2011.    Anticoagulation Management Assessment/Plan:      The patient's current anticoagulation dose is Warfarin sodium 5 mg tabs: Use as directed by Anticoagulation Clinic.  The target INR is 2.0-3.0.  The next INR is due 12/21/2009.  Anticoagulation instructions were given to patient.  Results were reviewed/authorized by Bethena Midget, RN.  She was notified by Lew Dawes, PharmD Candidate.         Prior Anticoagulation Instructions: INR 2.5 Continue 1/2 pill everyday except 1 pill on Mondays. Recheck in 4 weeks.   Current Anticoagulation Instructions: INR 4.0  Skip today's dose then continue current dose of 0.5 tablet daily except 1 tablet on Mondays. Recheck in 2 weeks.

## 2010-12-12 NOTE — Progress Notes (Signed)
Summary: order for stool sample card  Phone Note From Other Clinic   Caller: [facility] Call For: Christina Mckinney  Summary of Call: she came to the lab with stool sample card.  they need an order to process please fax them one  Initial call taken by: Roselle Locus,  January 24, 2010 2:46 PM  Follow-up for Phone Call        order printed ant faxed to [facility] Follow-up by: Glendell Docker CMA,  January 24, 2010 3:43 PM

## 2010-12-12 NOTE — Op Note (Signed)
Summary: Office Procedure Consent  Office Procedure Consent   Imported By: Darius Bump 10/20/2010 16:39:03  _____________________________________________________________________  External Attachment:    Type:   Image     Comment:   External Document

## 2010-12-12 NOTE — Medication Information (Signed)
Summary: rov/tm  Anticoagulant Therapy  Managed by: Bethena Midget, RN Referring MD: Dr. Velta Addison PCP: [name] DO Supervising MD: Shirlee Latch MD, Durant Scibilia Indication 1: Pulmonary Embolism Lab Used: [facility] [place] Site: [address] INR POC 1.8 INR RANGE 2-3  Dietary changes: yes       Details: Did eat extra leafy green vegetables  Health status changes: no    Bleeding/hemorrhagic complications: no    Recent/future hospitalizations: no    Any changes in medication regimen? yes       Details: Flagyl  TID until 12/19/09   Recent/future dental: no  Any missed doses?: yes     Details: Did skip Saturdays dose per our instruction  Is patient compliant with meds? yes       Allergies: No Known Drug Allergies  Anticoagulation Management History:      The patient is taking warfarin and comes in today for a routine follow up visit.  Positive risk factors for bleeding include an age of 75 years or older.  The bleeding index is 'intermediate risk'.  Positive CHADS2 values include History of HTN and Age > 75 years old.  Her last INR was 2.7.  Anticoagulation responsible provider: Shirlee Latch MD, Caia Lofaro.  INR POC: 1.8.  Cuvette Lot#: [id number].  Exp: 02//2012.    Anticoagulation Management Assessment/Plan:      The patient's current anticoagulation dose is Warfarin sodium 5 mg tabs: Use as directed by Anticoagulation Clinic.  The target INR is 2.0-3.0.  The next INR is due 12/21/2009.  Anticoagulation instructions were given to patient.  Results were reviewed/authorized by Bethena Midget, RN.  She was notified by Bethena Midget, RN, BSN.         Prior Anticoagulation Instructions: INR 4.0  Skip today's dose then continue current dose of 0.5 tablet daily except 1 tablet on Mondays. Recheck in 2 weeks.  Current Anticoagulation Instructions: INR 1.8 Today take 1/2 pill then take 1/2 pill everyday until next visit due to Antibiotic. Recheck on 12/21/09 at 145pm

## 2010-12-12 NOTE — Medication Information (Signed)
Summary: rov/tm  Anticoagulant Therapy  Managed by: Weston Brass, PharmD Referring MD: Dr. Velta Addison PCP: [name] DO Supervising MD: Antoine Poche MD, Fayrene Fearing Indication 1: Pulmonary Embolism Lab Used: [facility] [place] Site: [address] INR POC 1.8 INR RANGE 2-3  Dietary changes: no    Health status changes: no    Bleeding/hemorrhagic complications: no    Recent/future hospitalizations: no    Any changes in medication regimen? no    Recent/future dental: no  Any missed doses?: no       Is patient compliant with meds? yes       Allergies: No Known Drug Allergies  Anticoagulation Management History:      The patient is taking warfarin and comes in today for a routine follow up visit.  Positive risk factors for bleeding include an age of 22 years or older.  The bleeding index is 'intermediate risk'.  Positive CHADS2 values include History of HTN and Age > 33 years old.  Her last INR was 2.7.  Anticoagulation responsible provider: Antoine Poche MD, Fayrene Fearing.  INR POC: 1.8.  Cuvette Lot#: [id number].  Exp: 06/2011.    Anticoagulation Management Assessment/Plan:      The patient's current anticoagulation dose is Warfarin sodium 5 mg tabs: Use as directed by Anticoagulation Clinic.  The target INR is 2.0-3.0.  The next INR is due 04/07/2010.  Anticoagulation instructions were given to patient.  Results were reviewed/authorized by Weston Brass, PharmD.  She was notified by Weston Brass PharmD.         Prior Anticoagulation Instructions: INR 1.7 Today take 7.5mg s then change dose to 5mg s daily except 2.5mg s on Tuesdays and Thursdays . Recheck in 2 weeks.   Current Anticoagulation Instructions: INR 1.8  Take 1 1/2 tablets today then increase dose to 1 tablet every day except 1/2 tablet on Thursday

## 2010-12-12 NOTE — Progress Notes (Signed)
Summary: Medication Reaction  Phone Note Call from Patient Call back at [facility] Phone [phone] Call back at Work Phone [phone]   Caller: Patient Call For: [name] DO Summary of Call: patient was seen by Dr Vassie Loll today at the [facility] location, and after her visit,  I was approached by her with complaints of dizziness, and being off balance. She states she spoke with Dr Vassie Loll regarding her concerns and she was advised to follow up with Dr Artist Pais. Patient states she believes the  Effexor might be contributing to her being off balance and walking around like the head was in a fog. She was informed that I would create a phone note to inform Dr Artist Pais of her current symptoms. In the meantime she states she sill go ahead and stop taking the Effexor and continue with the Lorazepam. Of not she states her neurologist has her taking prednisone for her shakes which have helped her . Initial call taken by: Glendell Docker CMA,  February 09, 2010 4:52 PM  Follow-up for Phone Call         I would advise against abruptly stopping effexor.  take lower dose.  see rx.    prednisone can raise her blood sugar.  have her come for BS check tomorrow Follow-up by: [name] DO,  February 09, 2010 5:26 PM  Additional Follow-up for Phone Call Additional follow up Details #1::        patient advised per Dr Artist Pais instructions, she state she does not have that feeling that she has yesterday, today, She states she has a friend who has a glucometer and she will check her blood sugar and call back with the reading Additional Follow-up by: Glendell Docker CMA,  February 10, 2010 8:05 AM    Additional Follow-up for Phone Call Additional follow up Details #2::    received a flag from Bethena Midget, RN stating patient was seen in the [facility]madin Clinic today, and her blood sugar was 101 Follow-up by: Glendell Docker CMA,  February 10, 2010 2:36 PM  Additional Follow-up for Phone Call Additional follow up Details #3:: Details for  Additional Follow-up Action Taken: noted  New/Updated Medications: EFFEXOR XR 37.5 MG XR24H-CAP (VENLAFAXINE HCL) one by mouth once daily Prescriptions: EFFEXOR XR 37.5 MG XR24H-CAP (VENLAFAXINE HCL) one by mouth once daily  #30 x 0   Entered and Authorized by:   [name] DO   Signed by:   [name] DO on 02/09/2010   Method used:   Electronically to        [facility]       [address]       [place], [place]  [address]       Ph: [phone]       Fax: [phone]   RxID:   [id number]

## 2010-12-12 NOTE — Assessment & Plan Note (Signed)
Summary: black spot on back of leg hurts/mhf   Vital Signs:  Patient profile:   77 year old female Height:      61 inches O2 Sat:      97 % on Room air Temp:     97.8 degrees F oral Pulse rate:   95 / minute BP sitting:   130 / 70  (right arm)  Vitals Entered By: Lucious Groves (November 14, 2009 3:43 PM)  O2 Flow:  Room air CC: Pt here for evaluation of black spot on Left side of her back and evaluate left knee pain. Pt would like the "vessel area" on left knee looked at./kb   Primary Care Provider:  Dondra Spry DO  CC:  Pt here for evaluation of black spot on Left side of her back and evaluate left knee pain. Pt would like the "vessel area" on left knee looked at./kb.  History of Present Illness: 77 y/o with hx of PE c/o left leg/knee pain.   she recently returned from cruise to [place] she has been taking coumadin as directed her knee hurt like this before her PE she has hx of gout - no redness or significant swelling  she also has small bruise left flank.  bumped against something.   Current Medications (verified): 1)  Prevacid 30 Mg  Cpdr (Lansoprazole) .... Take 1 Tablet By Mouth Two Times A Day 2)  Furosemide 40 Mg  Tabs (Furosemide) .... Take 1 Tablet By Mouth Once A Day As Directed 3)  Synthroid 50 Mcg  Tabs (Levothyroxine Sodium) .... Take 1 Tablet By Mouth Once A Day 4)  Toprol Xl 50 Mg  Tb24 (Metoprolol Succinate) .... Take 1 Tablet By Mouth Once A Day 5)  Cozaar 100 Mg  Tabs (Losartan Potassium) .... Take 1 Tablet By Mouth Once A Day 6)  Astelin 137 Mcg/spray  Soln (Azelastine Hcl) .... One Spray Each Nostril Once Daily As Needed 7)  Zinc 50 Mg  Caps (Zinc) .... Take 1 Tablet By Mouth Once A Day 8)  Simvastatin 40 Mg Tabs (Simvastatin) .... One By Mouth Qhs 9)  Uloric 40 Mg Tabs (Febuxostat) .... One By Mouth Once Daily 10)  Effexor Xr 75 Mg Xr24h-Cap (Venlafaxine Hcl) .... Take 1 Capsule By Mouth Once A Day 11)  Vitamin B-12 Cr 1500 Mcg Cr-Tabs (Cyanocobalamin)  .... Take 1 Tablet By Mouth Once A Day 12)  Citalopram Hydrobromide 20 Mg Tabs (Citalopram Hydrobromide) .... Take 1 Tablet By Mouth Once A Day 13)  Calcium Carbonate-Vitamin D 600-400 Mg-Unit  Tabs (Calcium Carbonate-Vitamin D) .... Take 1 Tablet By Mouth Once A Day 14)  Colchicine 0.6 Mg Tabs (Colchicine) .... As Needed 15)  Vitamin D 2.000 Units .... Once Daily 16)  Pataday 0.2 % Soln (Olopatadine Hcl) .... One Drop Each Eye Once Daily 17)  Ropinirole Hcl 1 Mg Tabs (Ropinirole Hcl) .... One At Bedtime 18)  Mucinex Dm Maximum Strength 60-1200 Mg Xr12h-Tab (Dextromethorphan-Guaifenesin) .... Take 1 Tablet By Mouth Two Times A Day As Needed 19)  Senokot S 8.6-50 Mg Tabs (Sennosides-Docusate Sodium) .... Take 1-2 Tablets By Mouth Two Times A Day As Needed 20)  Warfarin Sodium 5 Mg Tabs (Warfarin Sodium) .... Use As Directed By Anticoagulation Clinic 21)  Avelox 400 Mg Tabs (Moxifloxacin Hcl) .... One By Mouth Once Daily 22)  Dulera 200-5 Mcg/act Aero (Mometasone Furo-Formoterol Fum) .... 2 Puffs Two Times A Day  Allergies (verified): No Known Drug Allergies  Physical Exam  General:  alert, well-developed, and well-nourished.   Lungs:  normal respiratory effort and normal breath sounds.   Heart:  normal rate, regular rhythm, and no gallop.   Neurologic:  cranial nerves II-XII intact and gait normal.   Skin:  small bruise left flank Psych:  normally interactive and slightly anxious.     Impression & Recommendations:  Problem # 1:  KNEE PAIN, LEFT (ICD-719.46) Pt with left knee pain.  I suspect knee arthritis.  she has been taking OTC advil as needed.  she is afraid of DVT.  I doubt.  she has be adequately anticoagulated.  check u/s.  check x ray.  refer to Dr. Cleophas Dunker for further eval and tx.  Orders: T-Knee Left 2 view ([id number]) Ultrasound (Ultrasound) Orthopedic Referral (Ortho)  Problem # 2:  CONTUSION OF UNSPECIFIED PART OF TRUNK (ICD-922.9) Pt with small bruise on left flank.   observe  Complete Medication List: 1)  Prevacid 30 Mg Cpdr (Lansoprazole) .... Take 1 tablet by mouth two times a day 2)  Furosemide 40 Mg Tabs (Furosemide) .... Take 1 tablet by mouth once a day as directed 3)  Synthroid 50 Mcg Tabs (Levothyroxine sodium) .... Take 1 tablet by mouth once a day 4)  Toprol Xl 50 Mg Tb24 (Metoprolol succinate) .... Take 1 tablet by mouth once a day 5)  Cozaar 100 Mg Tabs (Losartan potassium) .... Take 1 tablet by mouth once a day 6)  Astelin 137 Mcg/spray Soln (Azelastine hcl) .... One spray each nostril once daily as needed 7)  Zinc 50 Mg Caps (Zinc) .... Take 1 tablet by mouth once a day 8)  Simvastatin 40 Mg Tabs (Simvastatin) .... One by mouth qhs 9)  Uloric 40 Mg Tabs (Febuxostat) .... One by mouth once daily 10)  Effexor Xr 75 Mg Xr24h-cap (Venlafaxine hcl) .... Take 1 capsule by mouth once a day 11)  Vitamin B-12 Cr 1500 Mcg Cr-tabs (Cyanocobalamin) .... Take 1 tablet by mouth once a day 12)  Citalopram Hydrobromide 20 Mg Tabs (Citalopram hydrobromide) .... Take 1 tablet by mouth once a day 13)  Calcium Carbonate-vitamin D 600-400 Mg-unit Tabs (Calcium carbonate-vitamin d) .... Take 1 tablet by mouth once a day 14)  Colchicine 0.6 Mg Tabs (Colchicine) .... As needed 15)  Vitamin D 2.000 Units  .... Once daily 16)  Pataday 0.2 % Soln (Olopatadine hcl) .... One drop each eye once daily 17)  Ropinirole Hcl 1 Mg Tabs (Ropinirole hcl) .... One at bedtime 18)  Mucinex Dm Maximum Strength 60-1200 Mg Xr12h-tab (Dextromethorphan-guaifenesin) .... Take 1 tablet by mouth two times a day as needed 19)  Senokot S 8.6-50 Mg Tabs (Sennosides-docusate sodium) .... Take 1-2 tablets by mouth two times a day as needed 20)  Warfarin Sodium 5 Mg Tabs (Warfarin sodium) .... Use as directed by anticoagulation clinic 21)  Avelox 400 Mg Tabs (Moxifloxacin hcl) .... One by mouth once daily 22)  Dulera 200-5 Mcg/act Aero (Mometasone furo-formoterol fum) .... 2 puffs two times a  day  Patient Instructions: 1)  Please schedule a follow-up appointment in 2 months.

## 2010-12-12 NOTE — Medication Information (Signed)
Summary: rov/sp  Anticoagulant Therapy  Managed by: Inactive Referring MD: Dr. Velta Addison PCP: [name] DO Supervising MD: Tenny Craw MD, Gunnar Fusi Indication 1: Pulmonary Embolism Lab Used: [facility] [place] Site: [address] INR POC 1.0 INR RANGE 2-3          Comments: Patient was instructed to discontinue taking coumadin by Dr. Shawnee Knapp, vascular specialist at [facility].  Mrs. Fordham discontinued coumadin on Sept 2nd.    Allergies: No Known Drug Allergies  Anticoagulation Management History:      The patient is taking warfarin and comes in today for a routine follow up visit.  Positive risk factors for bleeding include an age of 3 years or older.  The bleeding index is 'intermediate risk'.  Positive CHADS2 values include History of HTN and Age > 54 years old.  Her last INR was 2.7.  Anticoagulation responsible [place]ante [place]: Tenny Craw MD, Gunnar Fusi.  INR POC: 1.0.  Cuvette Lot#: [id number].  Exp: 09/2011.    Anticoagulation Management Assessment/Plan:      The target INR is 2.0-3.0.  The next INR is due 07/21/2010.  Anticoagulation instructions were given to patient.  Results were reviewed/authorized by Inactive.  She was notified by Eda Keys.         Prior Anticoagulation Instructions: INR 2.9  Continue 1 tablet daily except 1/2 tablet Sun, Tue and Thu.  Return to clinic in 4 weeks.

## 2010-12-12 NOTE — Medication Information (Signed)
Summary: rov/sp  Anticoagulant Therapy  Managed by: Weston Brass, PharmD Referring MD: Dr. Velta Addison PCP: [name] DO Supervising MD: Myrtis Ser MD, Tinnie Gens Indication 1: Pulmonary Embolism Lab Used: [facility]  Site: [address] INR POC 2.2 INR RANGE 2-3  Dietary changes: no    Health status changes: yes       Details: having shoulder issues but going to PT   Bleeding/hemorrhagic complications: no    Recent/future hospitalizations: no    Any changes in medication regimen? yes       Details: on Vicodin and valium  Recent/future dental: no  Any missed doses?: no       Is patient compliant with meds? yes       Allergies: No Known Drug Allergies  Anticoagulation Management History:      The patient is taking warfarin and comes in today for a routine follow up visit.  Positive risk factors for bleeding include an age of 91 years or older.  The bleeding index is 'intermediate risk'.  Positive CHADS2 values include History of HTN and Age > 48 years old.  Her last INR was 2.7.  Anticoagulation responsible provider: Myrtis Ser MD, Tinnie Gens.  INR POC: 2.2.  Cuvette Lot#: [id number].  Exp: 06/2011.    Anticoagulation Management Assessment/Plan:      The patient's current anticoagulation dose is Warfarin sodium 5 mg tabs: Use as directed by Anticoagulation Clinic.  The target INR is 2.0-3.0.  The next INR is due 04/28/2010.  Anticoagulation instructions were given to patient.  Results were reviewed/authorized by Weston Brass, PharmD.  She was notified by Weston Brass PharmD.         Prior Anticoagulation Instructions: INR 1.8  Take 1 1/2 tablets today then increase dose to 1 tablet every day except 1/2 tablet on Thursday   Current Anticoagulation Instructions: INR 2.2  Continue same dose of 1 tablet every day except 1/2 tablet on Thursday

## 2010-12-12 NOTE — Medication Information (Signed)
Summary: rov/tm  Anticoagulant Therapy  Managed by: Bethena Midget, RN, BSN Referring MD: Dr. Velta Addison PCP: [name] DO Supervising MD: Tenny Craw MD, Gunnar Fusi Indication 1: Pulmonary Embolism Lab Used: [facility] [place] Site: [address] INR POC 1.3 INR RANGE 2-3  Dietary changes: no    Health status changes: yes       Details: Feeling dizziness  Bleeding/hemorrhagic complications: no    Recent/future hospitalizations: no    Any changes in medication regimen? yes       Details: Gabapentin added  Recent/future dental: no  Any missed doses?: yes     Details: missed 2.5mg s on Wednesday.   Is patient compliant with meds? yes      Comments: Glucose 101 in office  Allergies: No Known Drug Allergies  Anticoagulation Management History:      The patient is taking warfarin and comes in today for a routine follow up visit.  Positive risk factors for bleeding include an age of 65 years or older.  The bleeding index is 'intermediate risk'.  Positive CHADS2 values include History of HTN and Age > 89 years old.  Her last INR was 2.7.  Anticoagulation responsible provider: Tenny Craw MD, Gunnar Fusi.  INR POC: 1.3.  Cuvette Lot#: [id number].  Exp: 03/2011.    Anticoagulation Management Assessment/Plan:      The patient's current anticoagulation dose is Warfarin sodium 5 mg tabs: Use as directed by Anticoagulation Clinic.  The target INR is 2.0-3.0.  The next INR is due 02/24/2010.  Anticoagulation instructions were given to patient.  Results were reviewed/authorized by Bethena Midget, RN, BSN.  She was notified by Bethena Midget, RN, BSN.         Prior Anticoagulation Instructions: INR 1.4 Today take 1 pill then change dose to 1/2 pill everyday except 1 pill on Mondays and Wednesdays. Recheck in 2 weeks.   Current Anticoagulation Instructions: INR 1.3 Today take 1 1/2 pills then change dose to 1/2 pill everyday except 1 pill on Mondays, Wednesdays and Fridays. Recheck in 2 weeks.

## 2010-12-12 NOTE — Medication Information (Signed)
Summary: rov/eh  Anticoagulant Therapy  Managed by: Bethena Midget, RN Referring MD: Dr. Velta Addison PCP: [name] DO Supervising MD: Jens Som MD, Arlys John Indication 1: Pulmonary Embolism Lab Used: [facility] [place] Site: [address] INR POC 1.7 INR RANGE 2-3  Dietary changes: no    Health status changes: no    Bleeding/hemorrhagic complications: no    Recent/future hospitalizations: no    Any changes in medication regimen? no    Recent/future dental: no  Any missed doses?: yes     Details: missed a dose since last week.   Is patient compliant with meds? yes       Allergies: No Known Drug Allergies  Anticoagulation Management History:      The patient is taking warfarin and comes in today for a routine follow up visit.  Positive risk factors for bleeding include an age of 75 years or older.  The bleeding index is 'intermediate risk'.  Positive CHADS2 values include History of HTN and Age > 75 years old.  Her last INR was 2.7.  Anticoagulation responsible provider: Jens Som MD, Arlys John.  INR POC: 1.7.  Cuvette Lot#: [id number].  Exp: 02/2011.    Anticoagulation Management Assessment/Plan:      The patient's current anticoagulation dose is Warfarin sodium 5 mg tabs: Use as directed by Anticoagulation Clinic.  The target INR is 2.0-3.0.  The next INR is due 01/04/2010.  Anticoagulation instructions were given to patient.  Results were reviewed/authorized by Bethena Midget, RN.  She was notified by Bethena Midget, RN, BSN.         Prior Anticoagulation Instructions: INR 1.8 Today take 1/2 pill then take 1/2 pill everyday until next visit due to Antibiotic. Recheck on 12/21/09 at 145pm   Current Anticoagulation Instructions: INR 1.7 Today take 1 pill then change dose back to 1/2 pill everyday except 1 pill on Mondays. Recheck in 2 weeks.

## 2010-12-12 NOTE — Progress Notes (Signed)
  Phone Note Outgoing Call   Summary of Call: call pt - stool negative for C. Diff Initial call taken by: [name] DO,  December 16, 2009 10:20 AM  Follow-up for Phone Call        informed her that C- Diff is negative Follow-up by: Michaelle Copas,  December 16, 2009 11:35 AM

## 2010-12-12 NOTE — Progress Notes (Signed)
Summary: Hemoccult Cards  Phone Note Other Incoming   Caller: [facility] Summary of Call: They received a stool card for fecal occult and they do not use stoll cards.  What should they do   [phone] Initial call taken by: Roselle Locus,  January 25, 2010 11:08 AM  Follow-up for Phone Call        send cards to [facility] lab Follow-up by: [name] DO,  January 26, 2010 1:06 PM  Additional Follow-up for Phone Call Additional follow up Details #1::        Spoke with Tresa Endo at [facility], she forwarded the call to Minerva Areola in [place], who then transferred me to  [place], who then transferred me to [place] who was informed per Dr Artist Pais instructions. Address information provided Additional Follow-up by: Glendell Docker CMA,  January 26, 2010 3:02 PM

## 2010-12-12 NOTE — Progress Notes (Signed)
Summary: Ultrasound Results  Phone Note Outgoing Call   Summary of Call: call pt - lower ext u/s is negative for blood clot Initial call taken by: [name] DO,  November 15, 2009 10:04 AM  Follow-up for Phone Call        attempted to contact patient at [phone], no answer, detailed voice message left informing patient per Dr Artist Pais instructions Follow-up by: Glendell Docker CMA,  November 15, 2009 5:06 PM

## 2010-12-12 NOTE — Medication Information (Signed)
Summary: rov/ewj  Anticoagulant Therapy  Managed by: Weston Brass, PharmD Referring MD: Dr. Velta Addison PCP: [name] DO Supervising MD: Tenny Craw MD, Gunnar Fusi Indication 1: Pulmonary Embolism Lab Used: [facility] [place] Site: [address] INR POC 2.9 INR RANGE 2-3  Dietary changes: no    Health status changes: no    Bleeding/hemorrhagic complications: no    Recent/future hospitalizations: no    Any changes in medication regimen? no    Recent/future dental: no  Any missed doses?: no       Is patient compliant with meds? yes      Comments: Pt. complains of dizziness.  Agrees to notify her PCP.  Allergies: No Known Drug Allergies  Anticoagulation Management History:      The patient is taking warfarin and comes in today for a routine follow up visit.  Positive risk factors for bleeding include an age of 75 years or older.  The bleeding index is 'intermediate risk'.  Positive CHADS2 values include History of HTN and Age > 65 years old.  Her last INR was 2.7.  Anticoagulation responsible provider: Tenny Craw MD, Gunnar Fusi.  INR POC: 2.9.  Cuvette Lot#: [id number].  Exp: 08/2011.    Anticoagulation Management Assessment/Plan:      The patient's current anticoagulation dose is Warfarin sodium 5 mg tabs: Use as directed by Anticoagulation Clinic.  The target INR is 2.0-3.0.  The next INR is due 07/21/2010.  Anticoagulation instructions were given to patient.  Results were reviewed/authorized by Weston Brass, PharmD.  She was notified by Liana Gerold, PharmD Candidate.         Prior Anticoagulation Instructions: INR 2.1  Continue on same dosage 1 tablet daily except 1/2 tablet on Sundays, Tuesdays, and Thursdays.  Recheck in 2 weeks.   Current Anticoagulation Instructions: INR 2.9  Continue 1 tablet daily except 1/2 tablet Sun, Tue and Thu.  Return to clinic in 4 weeks.

## 2010-12-12 NOTE — Assessment & Plan Note (Signed)
Summary: congestion & gas- jr   Vital Signs:  Patient profile:   77 year old female O2 Sat:      98 % on Room air Temp:     97.9 degrees F oral Pulse rate:   83 / minute Pulse rhythm:   regular Resp:     18 per minute BP sitting:   160 / 70  (right arm) Cuff size:   large  Vitals Entered By: Glendell Docker CMA (December 08, 2009 4:47 PM)  O2 Flow:  Room air  Primary Care Provider:  [name] DO  CC:  Chest Congestion.  History of Present Illness: 77 y/o white female c/o chest congestion her previous episode of bronchitis got better but then symptoms got worse while she was visiting her daughter in hospital who died from cerebral aneurysm.   she notes wheezing,productive cough green in color, no temp, sinus drainage  Mucinex taken with some relief  pt also c/o loose stools.  stools are foul smelling. c/o bad gas  . she was treated with avelox in December 2010 for recurrent bronchitis.  Allergies (verified): No Known Drug Allergies  Past History:  Past Medical History: Current Problems:  HYPERTENSION (ICD-401.9) HYPOTHYROIDISM (ICD-244.9)   HYPERLIPIDEMIA (ICD-272.4) ? H/O CAD (cath in 2001)    HOT FLASHES (ICD-627.2) DYSPNEA ON EXERTION (ICD-786.09)  UNSPECIFIED SUBJECTIVE VISUAL DISTURBANCE (ICD-368.10) VARICOSE VEINS LOWER EXTREMITIES W/INFLAMMATION (ICD-454.1) TREMOR, ESSENTIAL (ICD-333.1) RHINOSINUSITIS, ACUTE (ICD-461.8) GOUT (ICD-274.9) OSTEOARTHRITIS, HX OF (ICD-V13.5) HX, PERSONAL, PEPTIC ULCER DISEASE (ICD-V12.71) DIVERTICULOSIS, COLON (ICD-562.10) ANXIETY (ICD-300.00) COLONIC POLYPS, HX OF (ICD-V12.72) GERD (ICD-530.81) DIVERTICULITIS, HX OF (ICD-V12.79) DEPRESSION (ICD-311) DIABETES MELLITUS, TYPE II, BORDERLINE (ICD-790.29) RESTLESS LEG SYNDROME (ICD-333.94) hyperplastic colon polyp, 2004; another colon polyp removed 2009 by Dr. in [place] unclear pathology Pulomonary Embolism- 07/2009  Past Surgical History: Cholecystectomy    Hysterectomy Deviated Septum    History of shoulder surgery        Family History: sister with stomach cancer, treated with surgery   Father died at age 18 with MI       Daughter recently died of cerebral aneurysm  Social History: Single Widowed    Alcohol use-yes     Tobacco Use - Former, started age 26, quit 1990, max 1 PPD    Review of Systems  The patient denies fever, chest pain, and abdominal pain.    Physical Exam  General:  alert, well-developed, and well-nourished.   Ears:  R ear normal and L ear normal.   Mouth:  pharynx pink and moist.   Neck:  No deformities, masses, or tenderness noted. Lungs:  normal respiratory effort.  slight coarse breath sounds left base Heart:  normal rate, regular rhythm, and no gallop.   Abdomen:  mild left sided abdominal tenderness Extremities:  No lower extremity edema  Psych:  tearful and slightly anxious.     Impression & Recommendations:  Problem # 1:  BRONCHITIS, ACUTE (ICD-466.0) Pt with intermittent cough.  cxr negative for acute airspace dz.  I suspect cough from post nasal gtt.  continue mucinex.  Patient advised to call office if symptoms persist or worsen.  The following medications were removed from the medication list:    Avelox 400 Mg Tabs (Moxifloxacin hcl) ..... One by mouth once daily Her updated medication list for this problem includes:    Mucinex Dm Maximum Strength 60-1200 Mg Xr12h-tab (Dextromethorphan-guaifenesin) .Marland Kitchen... Take 1 tablet by mouth two times a day as needed    Dulera 200-5 Mcg/act Aero (Mometasone furo-formoterol  fum) ..... 2 puffs two times a day    Metronidazole 500 Mg Tabs (Metronidazole) ..... One by mouth three times a day  Orders: T-2 View CXR, Same Day (71020.5TC)  Problem # 2:  DIARRHEA (ICD-787.91) start empiric flagyl.  pt advised to notify coumadin clinic of starting flagyl  Orders: T-Culture, C-Diff Toxin A/B ([id number])  Problem # 3:  PE (ICD-415.19) Notify coumadin  clinic of start of warfarin.  Her updated medication list for this problem includes:    Warfarin Sodium 5 Mg Tabs (Warfarin sodium) ..... Use as directed by anticoagulation clinic  Problem # 4:  STRESS REACTION, ACUTE, WITH EMOTIONAL DISTURBANCE (ICD-308.0)  Her daughter recently died from cerebral aneurysm.  significant grief rxn.  consoled pt.   use alprazolam as needed.   refer for grief counseling.  Orders: Misc. Referral (Misc. Ref)  Problem # 5:  HYPERTENSION (ICD-401.9) likely worse with stress rxn.  monitor at home.  we discussed relaxation techniques Her updated medication list for this problem includes:    Furosemide 40 Mg Tabs (Furosemide) .Marland Kitchen... Take 1 tablet by mouth once a day as directed    Toprol Xl 50 Mg Tb24 (Metoprolol succinate) .Marland Kitchen... Take 1 tablet by mouth once a day    Cozaar 100 Mg Tabs (Losartan potassium) .Marland Kitchen... Take 1 tablet by mouth once a day  BP today: 160/70 Prior BP: 130/70 (11/14/2009)  Labs Reviewed: K+: 4.1 (02/28/2009) Creat: : 1.0 (02/28/2009)   Chol: 103 (10/26/2008)   HDL: 52.0 (10/26/2008)   LDL: 37 (10/26/2008)   TG: 71 (10/26/2008)  Complete Medication List: 1)  Prevacid 30 Mg Cpdr (Lansoprazole) .... Take 1 tablet by mouth two times a day 2)  Furosemide 40 Mg Tabs (Furosemide) .... Take 1 tablet by mouth once a day as directed 3)  Synthroid 50 Mcg Tabs (Levothyroxine sodium) .... Take 1 tablet by mouth once a day 4)  Toprol Xl 50 Mg Tb24 (Metoprolol succinate) .... Take 1 tablet by mouth once a day 5)  Cozaar 100 Mg Tabs (Losartan potassium) .... Take 1 tablet by mouth once a day 6)  Astelin 137 Mcg/spray Soln (Azelastine hcl) .... One spray each nostril once daily as needed 7)  Zinc 50 Mg Caps (Zinc) .... Take 1 tablet by mouth once a day 8)  Simvastatin 40 Mg Tabs (Simvastatin) .... One by mouth qhs 9)  Uloric 40 Mg Tabs (Febuxostat) .... One by mouth once daily 10)  Effexor Xr 75 Mg Xr24h-cap (Venlafaxine hcl) .... Take 1 capsule by  mouth once a day 11)  Vitamin B-12 Cr 1500 Mcg Cr-tabs (Cyanocobalamin) .... Take 1 tablet by mouth once a day 12)  Citalopram Hydrobromide 20 Mg Tabs (Citalopram hydrobromide) .... Take 1 tablet by mouth once a day 13)  Calcium Carbonate-vitamin D 600-400 Mg-unit Tabs (Calcium carbonate-vitamin d) .... Take 1 tablet by mouth once a day 14)  Colchicine 0.6 Mg Tabs (Colchicine) .... As needed 15)  Vitamin D 2.000 Units  .... Once daily 16)  Pataday 0.2 % Soln (Olopatadine hcl) .... One drop each eye once daily 17)  Ropinirole Hcl 1 Mg Tabs (Ropinirole hcl) .... One at bedtime 18)  Mucinex Dm Maximum Strength 60-1200 Mg Xr12h-tab (Dextromethorphan-guaifenesin) .... Take 1 tablet by mouth two times a day as needed 19)  Senokot S 8.6-50 Mg Tabs (Sennosides-docusate sodium) .... Take 1-2 tablets by mouth two times a day as needed 20)  Warfarin Sodium 5 Mg Tabs (Warfarin sodium) .... Use as directed by anticoagulation clinic 21)  Dulera 200-5 Mcg/act Aero (Mometasone furo-formoterol fum) .... 2 puffs two times a day 22)  Alprazolam 0.25 Mg Tabs (Alprazolam) .... One tab by mouth three times a day as needed 23)  Metronidazole 500 Mg Tabs (Metronidazole) .... One by mouth three times a day  Patient Instructions: 1)  Call coumadin clinic and notify them re:  start of antibiotics - Flagyl 2)  Please schedule a follow-up appointment in 2 weeks. 3)  Call our office if your symptoms do not  improve or gets worse. Prescriptions: METRONIDAZOLE 500 MG TABS (METRONIDAZOLE) one by mouth three times a day  #30 x 0   Entered and Authorized by:   [name] DO   Signed by:   [name] DO on 12/08/2009   Method used:   Electronically to        [facility] (retail)       [address]       [place], [place]  [address]       Ph: [phone]       Fax: [phone]   RxID:   [id number]   Current Allergies (reviewed today): No known allergies

## 2010-12-12 NOTE — Progress Notes (Signed)
Summary: Issues for this patient   Phone Note Call from Patient   Caller: Patient Summary of Call: Christina Mckinney states that she is at [facility] with her daughter that is in intensive care there. Da is complaining she has chest congestion and yellow and green mucus and her nerves are really bad due to her daughter being in intensive care and not sure if she is going to make it.  She would like for you to call her something in for her cough and congestion and also call her something in for her nerves. She states her hands are just shaking really bad and she is really upset not knowing if her daughter will make it or not. please call this cell number that she has with her at the hospital where she is staying with her daughter. #[phone] Initial call taken by: Michaelle Copas,  December 01, 2009 2:28 PM  Follow-up for Phone Call        Darlene, please call in Rx for alprazolam.   call pt - I am sorry, but  I can not rx antibiotics w/o seeing and examining pt Follow-up by: [name] DO,  December 01, 2009 5:25 PM  Additional Follow-up for Phone Call Additional follow up Details #1::        attempted to contact patient at [phone], "significant other" states patient is not available she is at the hospital with her daughter. Per patient verbal ok and phone message he was informed per Dr Artist Pais instructions that patient will need office visit for antibiotics and anxiety med phone to pharmacy.Phone Call Completed, Rx Called In,  Additional Follow-up by: Glendell Docker CMA,  December 01, 2009 5:41 PM    New/Updated Medications: ALPRAZOLAM 0.25 MG TABS (ALPRAZOLAM) one tab by mouth three times a day as needed Prescriptions: ALPRAZOLAM 0.25 MG TABS (ALPRAZOLAM) one tab by mouth three times a day as needed  #30 x 0   Entered and Authorized by:   [name] DO   Signed by:   [name] DO on 12/01/2009   Method used:   Historical   RxID:   [id number]

## 2010-12-12 NOTE — Consult Note (Signed)
Summary: [facility]  [facility]   Imported By: Lanelle Bal 01/02/2010 10:44:26  _____________________________________________________________________  External Attachment:    Type:   Image     Comment:   External Document

## 2010-12-12 NOTE — Medication Information (Signed)
Summary: rov/sp  Anticoagulant Therapy  Managed by: Eda Keys, PharmD Referring MD: Dr. Velta Addison PCP: [name] DO Supervising MD: Shirlee Latch MD, Freida Busman Indication 1: Pulmonary Embolism Lab Used: [facility] Point of Care [place] Site: [address] INR POC 3.2 INR RANGE 2-3  Dietary changes: no    Health status changes: no    Bleeding/hemorrhagic complications: no    Recent/future hospitalizations: no    Any changes in medication regimen? no    Recent/future dental: no  Any missed doses?: no       Is patient compliant with meds? yes       Current Medications (verified): 1)  Prevacid 30 Mg  Cpdr (Lansoprazole) .... Take 1 Tablet By Mouth Once A Day 2)  Furosemide 40 Mg  Tabs (Furosemide) .... Take 1 Tablet By Mouth Once A Day As Directed 3)  Synthroid 50 Mcg  Tabs (Levothyroxine Sodium) .... Take 1 Tablet By Mouth Once A Day 4)  Toprol Xl 50 Mg  Tb24 (Metoprolol Succinate) .... Take 1 Tablet By Mouth Once A Day 5)  Cozaar 100 Mg  Tabs (Losartan Potassium) .... Take 1 Tablet By Mouth Once A Day 6)  Astelin 137 Mcg/spray  Soln (Azelastine Hcl) .... One Spray Each Nostril Once Daily As Needed 7)  Simvastatin 40 Mg Tabs (Simvastatin) .... One By Mouth Qhs 8)  Uloric 40 Mg Tabs (Febuxostat) .... One By Mouth Once Daily 9)  Effexor Xr 37.5 Mg Xr24h-Cap (Venlafaxine Hcl) .... One By Mouth Once Daily 10)  Calcium Carbonate-Vitamin D 600-400 Mg-Unit  Tabs (Calcium Carbonate-Vitamin D) .... Take 1 Tablet By Mouth Once A Day 11)  Colchicine 0.6 Mg Tabs (Colchicine) .... As Needed 12)  Vitamin D 2.000 Units .... Once Daily 13)  Pataday 0.2 % Soln (Olopatadine Hcl) .... One Drop Each Eye Once Daily 14)  Mucinex Dm Maximum Strength 60-1200 Mg Xr12h-Tab (Dextromethorphan-Guaifenesin) .... Take 1 Tablet By Mouth Two Times A Day As Needed 15)  Senokot S 8.6-50 Mg Tabs (Sennosides-Docusate Sodium) .... Take 1-2 Tablets By Mouth Two Times A Day As Needed 16)  Warfarin Sodium 5 Mg  Tabs (Warfarin Sodium) .... Use As Directed By Anticoagulation Clinic 17)  Dulera 200-5 Mcg/act Aero (Mometasone Furo-Formoterol Fum) .... 2 Puffs Two Times A Day As Needed 18)  Alprazolam 0.25 Mg Tabs (Alprazolam) .... One Tab By Mouth Bid As Needed 19)  Gabapentin 100 Mg Caps (Gabapentin) .... One To Two Caps One Daily At Bedtime 20)  Primidone 50 Mg Tabs (Primidone) .... Take 1 Tablet By Mouth Two Times A Day  Allergies: No Known Drug Allergies  Anticoagulation Management History:      The patient is taking warfarin and comes in today for a routine follow up visit.  Positive risk factors for bleeding include an age of 75 years or older.  The bleeding index is 'intermediate risk'.  Positive CHADS2 values include History of HTN and Age > 66 years old.  Her last INR was 2.7.  Anticoagulation responsible provider: Shirlee Latch MD, Joy Haegele.  INR POC: 3.2.  Cuvette Lot#: [id number].  Exp: 07/2011.    Anticoagulation Management Assessment/Plan:      The patient's current anticoagulation dose is Warfarin sodium 5 mg tabs: Use as directed by Anticoagulation Clinic.  The target INR is 2.0-3.0.  The next INR is due 05/22/2010.  Anticoagulation instructions were given to patient.  Results were reviewed/authorized by Eda Keys, PharmD.  She was notified by Eda Keys.  Prior Anticoagulation Instructions: INR 2.2  Continue same dose of 1 tablet every day except 1/2 tablet on Thursday   Current Anticoagulation Instructions: INR 3.2  Take 1/2 tablet today.  Then return to normal dosing schedule of 1/2 tablet on Thursday and 1 tablet all other days.  Return to clinic in 3 weeks.

## 2010-12-12 NOTE — Progress Notes (Signed)
Summary: Xray Results  Phone Note Outgoing Call   Summary of Call: call pt - x ray shows osteoarthritis / degenerative joint disease Initial call taken by: [name] DO,  October 19, 2010 5:40 PM  Follow-up for Phone Call        call placed to patient at [phone], she has been advised per Dr Artist Pais instructions Follow-up by: Glendell Docker CMA,  October 20, 2010 9:20 AM

## 2010-12-12 NOTE — Progress Notes (Signed)
Summary: med change  Phone Note Call from Patient Call back at [facility] Phone [phone] Call back at Work Phone [phone]   Caller: Patient Reason for Call: Talk to Nurse Summary of Call: PCP put her on METRONIDAZOLE 500 MG TABS (METRONIDAZOLE) one by mouth three times a day and XANAX Initial call taken by: Migdalia Dk,  December 09, 2009 11:41 AM  Follow-up for Phone Call        Spoke with pt. she is aware to skip coumadin Saturday due to start of Flagyl. Follow up scheduled for Monday.  Follow-up by: Bethena Midget, RN, BSN,  December 09, 2009 4:01 PM

## 2010-12-12 NOTE — Medication Information (Signed)
Summary: rov/ln  Anticoagulant Therapy  Managed by: Cloyde Reams, RN, BSN Referring MD: Dr. Velta Addison PCP: [name] DO Supervising MD: Jens Som MD, Arlys John Indication 1: Pulmonary Embolism Lab Used: [facility] [place] Site: [address] INR POC 2.1 INR RANGE 2-3   Health status changes: no    Bleeding/hemorrhagic complications: no    Recent/future hospitalizations: no    Any changes in medication regimen? no    Recent/future dental: no  Any missed doses?: no       Is patient compliant with meds? yes       Allergies: No Known Drug Allergies  Anticoagulation Management History:      The patient is taking warfarin and comes in today for a routine follow up visit.  Positive risk factors for bleeding include an age of 75 years or older.  The bleeding index is 'intermediate risk'.  Positive CHADS2 values include History of HTN and Age > 35 years old.  Her last INR was 2.7.  Anticoagulation responsible provider: Jens Som MD, Arlys John.  INR POC: 2.1.  Cuvette Lot#: [id number].  Exp: 08/2011.    Anticoagulation Management Assessment/Plan:      The patient's current anticoagulation dose is Warfarin sodium 5 mg tabs: Use as directed by Anticoagulation Clinic.  The target INR is 2.0-3.0.  The next INR is due 06/23/2010.  Anticoagulation instructions were given to patient.  Results were reviewed/authorized by Cloyde Reams, RN, BSN.  She was notified by Cloyde Reams RN.         Prior Anticoagulation Instructions: INR 5.4  Hold coumadin today and tomorrow.  Then change to 1 tab daily except for 1/2 tab on Sunday, Tuesday, and Thursday.  Re-check in 1 week.  Current Anticoagulation Instructions: INR 2.1  Continue on same dosage 1 tablet daily except 1/2 tablet on Sundays, Tuesdays, and Thursdays.  Recheck in 2 weeks.

## 2010-12-12 NOTE — Progress Notes (Signed)
Summary: Lab Results  Phone Note Outgoing Call   Summary of Call: call pt - blood test shows mild anemia.  thyroid, kidney function, b12 and electrolytes are normal.   A1c is stable.   I advise pt complete regular hemoccult cards within 1 week. Initial call taken by: [name] DO,  January 20, 2010 8:10 AM  Follow-up for Phone Call        patient advised per Dr Artist Pais instructions, stool cards mailed to patient  Follow-up by: Glendell Docker CMA,  January 20, 2010 9:12 AM

## 2010-12-12 NOTE — Assessment & Plan Note (Signed)
Summary: GOUT EAR ACHE/MHF   Vital Signs:  Patient profile:   77 year old female Temp:     98.1 degrees F oral Pulse rate:   76 / minute Pulse rhythm:   regular Resp:     18 per minute BP sitting:   128 / 60  (left arm) Cuff size:   large  Vitals Entered By: Mervin Kung CMA (January 19, 2010 4:16 PM) CC: room 3  Swelling in right jaw and ear area X 1 week. Pt states she fell on her face around that time.   Primary Care Provider:  DThomos Lemons DO  CC:  room 3  Swelling in right jaw and ear area X 1 week. Pt states she fell on her face around that time.Marland Kitchen  History of Present Illness: 77 y/o white female with multiple complaints.  bilateral lower ext pain.  not sure if gout related.  no redness or swelling.   c/o burning sensation.  symptoms worse at night.    right ear lobe irritated.  hole for ear ring blocked.  mild redness.  no drainage  Anxiety - still stressed over daughter's death.  "I'm trying to keep family together"  htn - stable  Allergies (verified): No Known Drug Allergies  Past History:  Past Medical History: Current Problems:  HYPERTENSION (ICD-401.9) HYPOTHYROIDISM (ICD-244.9)   HYPERLIPIDEMIA (ICD-272.4) ? H/O CAD (cath in 2001)     HOT FLASHES (ICD-627.2)  DYSPNEA ON EXERTION (ICD-786.09)  UNSPECIFIED SUBJECTIVE VISUAL DISTURBANCE (ICD-368.10) VARICOSE VEINS LOWER EXTREMITIES W/INFLAMMATION (ICD-454.1) TREMOR, ESSENTIAL (ICD-333.1) RHINOSINUSITIS, ACUTE (ICD-461.8) GOUT (ICD-274.9) OSTEOARTHRITIS, HX OF (ICD-V13.5) HX, PERSONAL, PEPTIC ULCER DISEASE (ICD-V12.71) DIVERTICULOSIS, COLON (ICD-562.10) ANXIETY (ICD-300.00) COLONIC POLYPS, HX OF (ICD-V12.72) GERD (ICD-530.81) DIVERTICULITIS, HX OF (ICD-V12.79) DEPRESSION (ICD-311) DIABETES MELLITUS, TYPE II, BORDERLINE (ICD-790.29) RESTLESS LEG SYNDROME (ICD-333.94) hyperplastic colon polyp, 2004; another colon polyp removed 2009 by Dr. in [place] unclear pathology Pulomonary Embolism-  07/2009  Past Surgical History: Cholecystectomy  Hysterectomy Deviated Septum     History of shoulder surgery         Family History: sister with stomach cancer, treated with surgery   Father died at age 31 with MI       Daughter recently died of cerebral aneurysm    Social History: Single Widowed     Alcohol use-yes     Tobacco Use - Former, started age 57, quit 1990, max 1 PPD     Review of Systems       she c/o loose stools  Physical Exam  General:  alert, well-developed, and well-nourished.   Head:  normocephalic and atraumatic.   Ears:  L ear normal.  right ear lobe slightly red and swollen,  no drainage Mouth:  pharynx pink and moist.   Neck:  supple and no masses.   Lungs:  normal respiratory effort and normal breath sounds.   Heart:  normal rate, regular rhythm, and no gallop.   Abdomen:  soft, non-tender, and normal bowel sounds.   Msk:  varicose veins of lower ext - left greater than right Extremities:  trace left pedal edema and 1+ right pedal edema.   Neurologic:  decreased sensation to vibration - lower ext Psych:  normally interactive and good eye contact.     Impression & Recommendations:  Problem # 1:  NEUROPATHY (ICD-355.9) Pt with burning sensation of the lower ext .   use gabapentin as directed.  I doubt symptoms related to gout. Orders: T-Vitamin B12 ([id number])  Problem # 2:  STRESS REACTION, ACUTE, WITH EMOTIONAL DISTURBANCE (ICD-308.0) Assessment: Improved continue citalopram.  use alprazolam as needed  Problem # 3:  DIARRHEA (ICD-787.91) C Diff toxin negative.  use fiber supplement and probiotic.  Problem # 4:  HYPOTHYROIDISM (ICD-244.9) repeat TSH Her updated medication list for this problem includes:    Synthroid 50 Mcg Tabs (Levothyroxine sodium) .Marland Kitchen... Take 1 tablet by mouth once a day  Orders: T-TSH ([id number])  Labs Reviewed: TSH: 1.25 (02/28/2009)    HgBA1c: 6.1 (02/28/2009) Chol: 103 (10/26/2008)   HDL: 52.0  (10/26/2008)   LDL: 37 (10/26/2008)   TG: 71 (10/26/2008)  Complete Medication List: 1)  Prevacid 30 Mg Cpdr (Lansoprazole) .... Take 1 tablet by mouth two times a day 2)  Furosemide 40 Mg Tabs (Furosemide) .... Take 1 tablet by mouth once a day as directed 3)  Synthroid 50 Mcg Tabs (Levothyroxine sodium) .... Take 1 tablet by mouth once a day 4)  Toprol Xl 50 Mg Tb24 (Metoprolol succinate) .... Take 1 tablet by mouth once a day 5)  Cozaar 100 Mg Tabs (Losartan potassium) .... Take 1 tablet by mouth once a day 6)  Astelin 137 Mcg/spray Soln (Azelastine hcl) .... One spray each nostril once daily as needed 7)  Zinc 50 Mg Caps (Zinc) .... Take 1 tablet by mouth once a day 8)  Simvastatin 40 Mg Tabs (Simvastatin) .... One by mouth qhs 9)  Uloric 40 Mg Tabs (Febuxostat) .... One by mouth once daily 10)  Effexor Xr 75 Mg Xr24h-cap (Venlafaxine hcl) .... Take 1 capsule by mouth once a day 11)  Vitamin B-12 Cr 1500 Mcg Cr-tabs (Cyanocobalamin) .... Take 1 tablet by mouth once a day 12)  Citalopram Hydrobromide 20 Mg Tabs (Citalopram hydrobromide) .... Take 1 tablet by mouth once a day 13)  Calcium Carbonate-vitamin D 600-400 Mg-unit Tabs (Calcium carbonate-vitamin d) .... Take 1 tablet by mouth once a day 14)  Colchicine 0.6 Mg Tabs (Colchicine) .... As needed 15)  Vitamin D 2.000 Units  .... Once daily 16)  Pataday 0.2 % Soln (Olopatadine hcl) .... One drop each eye once daily 17)  Mucinex Dm Maximum Strength 60-1200 Mg Xr12h-tab (Dextromethorphan-guaifenesin) .... Take 1 tablet by mouth two times a day as needed 18)  Senokot S 8.6-50 Mg Tabs (Sennosides-docusate sodium) .... Take 1-2 tablets by mouth two times a day as needed 19)  Warfarin Sodium 5 Mg Tabs (Warfarin sodium) .... Use as directed by anticoagulation clinic 20)  Dulera 200-5 Mcg/act Aero (Mometasone furo-formoterol fum) .... 2 puffs two times a day 21)  Alprazolam 0.25 Mg Tabs (Alprazolam) .... One tab by mouth bid as needed 22)   Gabapentin 100 Mg Caps (Gabapentin) .... One to two caps one daily at bedtime  Other Orders: T-Basic Metabolic Panel [phone]) T-Hepatic Function [phone]) T-CBC No Diff ([id number]) T- Hemoglobin A1C ([id number])  Patient Instructions: 1)  Use citrucelle powder over the counter twice daily 2)  Take Accu Flora  once daily 3)  Please schedule a follow-up appointment in 2 months. Prescriptions: GABAPENTIN 100 MG CAPS (GABAPENTIN) one to two caps one daily at bedtime  #60 x 3   Entered and Authorized by:   [name] DO   Signed by:   [name] DO on 01/19/2010   Method used:   Electronically to        [facility] (retail[facility]       [address]       [place]  [place], [place]  [address]       Ph: [phone]       Fax: [phone]   RxID:   [id number] ALPRAZOLAM 0.25 MG TABS (ALPRAZOLAM) one tab by mouth bid as needed  #60 x 3   Entered and Authorized by:   [name] DO   Signed by:   [name] DO on 01/19/2010   Method used:   Print then Give to Patient   RxID:   [id number]   Current Allergies (reviewed today): No known allergies

## 2010-12-12 NOTE — Progress Notes (Signed)
Summary: Lab Results  Phone Note Outgoing Call   Summary of Call: call pt - calcium level slightly elevated.  stop caltrate.  ok to continue vit d supplement cbc, TSH, uric acid level - normal A1c 5.8 (improved) Initial call taken by: [name] DO,  October 20, 2010 1:13 PM  Follow-up for Phone Call        called placed to patient at [phone], no answer. A detailed voice message was left informing patient per Dr Artist Pais instructions. Message was left for patient to call back if any questions Follow-up by: Glendell Docker CMA,  October 20, 2010 2:16 PM

## 2010-12-12 NOTE — Miscellaneous (Signed)
Summary: Orders Update  Clinical Lists Changes  Problems: Added new problem of CAROTID ARTERY DISEASE (ICD-433.10) Orders: Added new Test order of Carotid Duplex (Carotid Duplex) - Signed 

## 2010-12-12 NOTE — Medication Information (Signed)
Summary: rov/eac  Anticoagulant Therapy  Managed by: Bethena Midget, RN, BSN Referring MD: Dr. Velta Addison PCP: [name] DO Supervising MD: Antoine Poche MD, Fayrene Fearing Indication 1: Pulmonary Embolism Lab Used: [facility] [place] Site: [address] INR POC 4.3 INR RANGE 2-3  Dietary changes: no    Health status changes: no    Bleeding/hemorrhagic complications: no    Recent/future hospitalizations: no    Any changes in medication regimen? no    Recent/future dental: no  Any missed doses?: no       Is patient compliant with meds? yes       Current Medications (verified): 1)  Prevacid 30 Mg  Cpdr (Lansoprazole) .... Take 1 Tablet By Mouth Once A Day 2)  Furosemide 40 Mg  Tabs (Furosemide) .... Take 1 Tablet By Mouth Once A Day As Directed 3)  Synthroid 50 Mcg  Tabs (Levothyroxine Sodium) .... Take 1 Tablet By Mouth Once A Day 4)  Toprol Xl 50 Mg  Tb24 (Metoprolol Succinate) .... Take 1 Tablet By Mouth Once A Day 5)  Cozaar 100 Mg  Tabs (Losartan Potassium) .... Take 1 Tablet By Mouth Once A Day 6)  Astelin 137 Mcg/spray  Soln (Azelastine Hcl) .... One Spray Each Nostril Once Daily As Needed 7)  Simvastatin 40 Mg Tabs (Simvastatin) .... One By Mouth Qhs 8)  Calcium Carbonate-Vitamin D 600-400 Mg-Unit  Tabs (Calcium Carbonate-Vitamin D) .... Take 1 Tablet By Mouth Once A Day 9)  Colchicine 0.6 Mg Tabs (Colchicine) .... As Needed 10)  Vitamin D 2.000 Units .... Once Daily 11)  Pataday 0.2 % Soln (Olopatadine Hcl) .... One Drop Each Eye Once Daily 12)  Mucinex Dm Maximum Strength 60-1200 Mg Xr12h-Tab (Dextromethorphan-Guaifenesin) .... Take 1 Tablet By Mouth Two Times A Day As Needed 13)  Senokot S 8.6-50 Mg Tabs (Sennosides-Docusate Sodium) .... Take 1-2 Tablets By Mouth Two Times A Day As Needed 14)  Warfarin Sodium 5 Mg Tabs (Warfarin Sodium) .... Use As Directed By Anticoagulation Clinic 15)  Alprazolam 0.25 Mg Tabs (Alprazolam) .... One Tab By Mouth Bid As  Needed 16)  Gabapentin 100 Mg Caps (Gabapentin) .... One To Two Caps One Daily At Bedtime 17)  Primidone 50 Mg Tabs (Primidone) .... Take 1/2 Tablet By Mouth Two Times A Day  Allergies (verified): No Known Drug Allergies  Anticoagulation Management History:      The patient is taking warfarin and comes in today for a routine follow up visit.  Positive risk factors for bleeding include an age of 39 years or older.  The bleeding index is 'intermediate risk'.  Positive CHADS2 values include History of HTN and Age > 64 years old.  Her last INR was 2.7.  Anticoagulation responsible provider: Antoine Poche MD, Fayrene Fearing.  INR POC: 4.3.  Cuvette Lot#: [id number].  Exp: 07/2011.    Anticoagulation Management Assessment/Plan:      The patient's current anticoagulation dose is Warfarin sodium 5 mg tabs: Use as directed by Anticoagulation Clinic.  The target INR is 2.0-3.0.  The next INR is due 06/02/2010.  Anticoagulation instructions were given to patient.  Results were reviewed/authorized by Bethena Midget, RN, BSN.  She was notified by Bethena Midget, RN, BSN.         Prior Anticoagulation Instructions: INR 3.2  Take 1/2 tablet today.  Then return to normal dosing schedule of 1/2 tablet on Thursday and 1 tablet all other days.  Return to clinic in 3 weeks.    Current Anticoagulation  Instructions: INR 4.3 Skip today's dose then change dose to 1 pill everyday except 1/2 on Sundays and Thursdays. Recheck in 2  weeks.

## 2010-12-12 NOTE — Letter (Signed)
   [place] at [facility] [address] [place], [place]  [address]  [place] Phone: [phone]      February 03, 2010   MALENE BLAYDES [address] [place], [place] [address]  RE:  LAB RESULTS  Dear  Ms. Matranga,  The following is an interpretation of your most recent lab tests.  Please take note of any instructions provided or changes to medications that have resulted from your lab work.  Hemoccult Test for blood in stool:  negative          Sincerely Yours,    Dr. [name]

## 2010-12-12 NOTE — Letter (Signed)
Summary: [facility]  [facility]   Imported By: Lanelle Bal 05/30/2010 10:01:24  _____________________________________________________________________  External Attachment:    Type:   Image     Comment:   External Document

## 2010-12-12 NOTE — Medication Information (Signed)
Summary: rov/tm  Anticoagulant Therapy  Managed by: Bethena Midget, RN, BSN Referring MD: Dr. Velta Addison PCP: [name] DO Supervising MD: Juanda Chance MD, Katianne Barre Indication 1: Pulmonary Embolism Lab Used: [facility] [place] Site: [address] INR POC 1.7 INR RANGE 2-3  Dietary changes: no    Health status changes: no    Bleeding/hemorrhagic complications: no    Recent/future hospitalizations: no    Any changes in medication regimen? no    Recent/future dental: no  Any missed doses?: no       Is patient compliant with meds? yes       Allergies: No Known Drug Allergies  Anticoagulation Management History:      The patient is taking warfarin and comes in today for a routine follow up visit.  Positive risk factors for bleeding include an age of 75 years or older.  The bleeding index is 'intermediate risk'.  Positive CHADS2 values include History of HTN and Age > 65 years old.  Her last INR was 2.7.  Anticoagulation responsible provider: Juanda Chance MD, Smitty Cords.  INR POC: 1.7.  Cuvette Lot#: [id number].  Exp: 04/2011.    Anticoagulation Management Assessment/Plan:      The patient's current anticoagulation dose is Warfarin sodium 5 mg tabs: Use as directed by Anticoagulation Clinic.  The target INR is 2.0-3.0.  The next INR is due 03/24/2010.  Anticoagulation instructions were given to patient.  Results were reviewed/authorized by Bethena Midget, RN, BSN.  She was notified by Bethena Midget, RN, BSN.         Prior Anticoagulation Instructions: INR 1.6 Today take 7.5mg s then change dose to 5mg s daily except 2.5mg s on T, T& Sun. Recheck in 2 weeks.   Current Anticoagulation Instructions: INR 1.7 Today take 7.5mg s then change dose to 5mg s daily except 2.5mg s on Tuesdays and Thursdays . Recheck in 2 weeks.

## 2010-12-12 NOTE — Medication Information (Signed)
Summary: rov/tm  Anticoagulant Therapy  Managed by: Bethena Midget, RN, BSN Referring MD: Dr. Velta Addison PCP: Algis Downs [name] DO Supervising MD: Daleen Squibb MD, Maisie Fus Indication 1: Pulmonary Embolism Lab Used: [facility] [place] Site: [address] INR POC 5.4 INR RANGE 2-3  Dietary changes: no    Health status changes: no    Bleeding/hemorrhagic complications: yes       Details: bruising on L arm   Recent/future hospitalizations: no    Any changes in medication regimen? no    Recent/future dental: no  Any missed doses?: no       Is patient compliant with meds? yes       Allergies: No Known Drug Allergies  Anticoagulation Management History:      The patient is taking warfarin and comes in today for a routine follow up visit.  Positive risk factors for bleeding include an age of 75 years or older.  The bleeding index is 'intermediate risk'.  Positive CHADS2 values include History of HTN and Age > 18 years old.  Her last INR was 2.7.  Anticoagulation responsible provider: Daleen Squibb MD, Maisie Fus.  INR POC: 5.4.  Cuvette Lot#: [id number].  Exp: 08/2011.    Anticoagulation Management Assessment/Plan:      The patient's current anticoagulation dose is Warfarin sodium 5 mg tabs: Use as directed by Anticoagulation Clinic.  The target INR is 2.0-3.0.  The next INR is due 06/09/2010.  Anticoagulation instructions were given to patient.  Results were reviewed/authorized by Bethena Midget, RN, BSN.  She was notified by Dillard Cannon.         Prior Anticoagulation Instructions: INR 4.3 Skip today's dose then change dose to 1 pill everyday except 1/2 on Sundays and Thursdays. Recheck in 2  weeks.   Current Anticoagulation Instructions: INR 5.4  Hold coumadin today and tomorrow.  Then change to 1 tab daily except for 1/2 tab on Sunday, Tuesday, and Thursday.  Re-check in 1 week.

## 2010-12-12 NOTE — Assessment & Plan Note (Signed)
Summary: follow up-lb   Vital Signs:  Patient profile:   77 year old female Height:      61 inches Weight:      172 pounds BMI:     32.62 O2 Sat:      97 % on Room air Temp:     98.4 degrees F oral Pulse rate:   81 / minute Pulse rhythm:   regular Resp:     18 per minute BP sitting:   116 / 60  (right arm) Cuff size:   regular  Vitals Entered By: Glendell Docker CMA (July 18, 2010 3:29 PM)  O2 Flow:  Room air CC: follow-up visit Is Patient Diabetic? No Pain Assessment Patient in pain? yes      Comments c/o night sweats, evaluation of left leg, ted hose ordered, rx for Xanax   Primary Care Provider:  Dondra Spry DO  CC:  follow-up visit.  History of Present Illness:  Hypertension Follow-Up      This is a 77 year old woman who presents for Hypertension follow-up.  The patient denies lightheadedness and headaches.  The patient denies the following associated symptoms: chest pain.  Compliance with medications (by patient report) has been near 100%.  The patient reports that dietary compliance has been fair.  she has lost some weight and is feeling better overall  Hx of PE - seen by vasular specialist re:  hx of PE and varicose veins  hot flashes - she could not tolerate effexor  Preventive Screening-Counseling & Management  Alcohol-Tobacco     Smoking Status: quit  Allergies (verified): No Known Drug Allergies  Past History:  Past Medical History: Current Problems:  HYPERTENSION (ICD-401.9) HYPOTHYROIDISM (ICD-244.9)   HYPERLIPIDEMIA (ICD-272.4) ? H/O CAD (cath in 2001)     HOT FLASHES (ICD-627.2)  DYSPNEA ON EXERTION (ICD-786.09)  UNSPECIFIED SUBJECTIVE VISUAL DISTURBANCE (ICD-368.10) VARICOSE VEINS LOWER EXTREMITIES W/INFLAMMATION (ICD-454.1) TREMOR, ESSENTIAL (ICD-333.1) RHINOSINUSITIS, ACUTE (ICD-461.8) GOUT (ICD-274.9) OSTEOARTHRITIS, HX OF (ICD-V13.5)  HX, PERSONAL, PEPTIC ULCER DISEASE (ICD-V12.71) DIVERTICULOSIS, COLON  (ICD-562.10) ANXIETY (ICD-300.00) COLONIC POLYPS, HX OF (ICD-V12.72) GERD (ICD-530.81) DIVERTICULITIS, HX OF (ICD-V12.79) DEPRESSION (ICD-311) DIABETES MELLITUS, TYPE II, BORDERLINE (ICD-790.29) RESTLESS LEG SYNDROME (ICD-333.94) hyperplastic colon polyp, 2004; another colon polyp removed 2009 by Dr. in [place] unclear pathology Pulomonary Embolism- 07/2009  Past Surgical History: Cholecystectomy  Hysterectomy Deviated Septum     History of shoulder surgery          Family History: sister with stomach cancer, treated with surgery   Father died at age 40 with MI       Daughter recently died of cerebral aneurysm     Social History: Single Widowed     Alcohol use-yes     Tobacco Use - Former, started age 12, quit 1990, max 1 PPD      Physical Exam  General:  alert, well-developed, and well-nourished.   Head:  normocephalic and atraumatic.   Neck:  supple and no masses.  no carotid bruits.   Lungs:  normal respiratory effort, normal breath sounds, no crackles, and no wheezes.   Heart:  normal rate, regular rhythm, no murmur, and no gallop.   Extremities:  trace left pedal edema and pedal edema.     Impression & Recommendations:  Problem # 1:  HOT FLASHES (ICD-627.2) she could not tolerate effexor.  It caused vertigo and dizziness use gabapentin  Problem # 2:  HYPERTENSION (ICD-401.9) Assessment: Unchanged  Her updated medication list for this problem includes:  Furosemide 40 Mg Tabs (Furosemide) .Marland Kitchen... Take 1 tablet by mouth once a day as directed    Toprol Xl 50 Mg Tb24 (Metoprolol succinate) .Marland Kitchen... Take 1 tablet by mouth once a day    Cozaar 100 Mg Tabs (Losartan potassium) .Marland Kitchen... Take 1 tablet by mouth once a day  Orders: T-Basic Metabolic Panel [id number])  BP today: 116/60 Prior BP: 140/70 (02/09/2010)  Labs Reviewed: K+: 4.9 (01/19/2010) Creat: : 0.92 (01/19/2010)   Chol: 103 (10/26/2008)   HDL: 52.0 (10/26/2008)   LDL: 37 (10/26/2008)   TG: 71  (10/26/2008)  Problem # 3:  PULMONARY EMBOLISM (ICD-415.1) Assessment: Unchanged pt seen by vascular specialist at [facility].  no change in plan  The following medications were removed from the medication list:    Warfarin Sodium 5 Mg Tabs (Warfarin sodium) ..... Use as directed by anticoagulation clinic  Problem # 4:  VARICOSE VEINS LOWER EXTREMITIES W/INFLAMMATION (ICD-454.1) seen by vascualr specialist - Dr Shawnee Knapp at [facility].  use compression stockings as directed  Complete Medication List: 1)  Prevacid 30 Mg Cpdr (Lansoprazole) .... Take 1 tablet by mouth once a day 2)  Furosemide 40 Mg Tabs (Furosemide) .... Take 1 tablet by mouth once a day as directed 3)  Synthroid 50 Mcg Tabs (Levothyroxine sodium) .... Take 1 tablet by mouth once a day 4)  Toprol Xl 50 Mg Tb24 (Metoprolol succinate) .... Take 1 tablet by mouth once a day 5)  Cozaar 100 Mg Tabs (Losartan potassium) .... Take 1 tablet by mouth once a day 6)  Astelin 137 Mcg/spray Soln (Azelastine hcl) .... One spray each nostril once daily as needed 7)  Simvastatin 40 Mg Tabs (Simvastatin) .... One by mouth qhs 8)  Calcium Carbonate-vitamin D 600-400 Mg-unit Tabs (Calcium carbonate-vitamin d) .... Take 1 tablet by mouth once a day 9)  Colchicine 0.6 Mg Tabs (Colchicine) .... As needed 10)  Vitamin D 2.000 Units  .... Once daily 11)  Pataday 0.2 % Soln (Olopatadine hcl) .... One drop each eye once daily 12)  Mucinex Dm Maximum Strength 60-1200 Mg Xr12h-tab (Dextromethorphan-guaifenesin) .... Take 1 tablet by mouth two times a day as needed 13)  Alprazolam 0.25 Mg Tabs (Alprazolam) .... One tab by mouth bid as needed 14)  Gabapentin 300 Mg Caps (Gabapentin) .... One by mouth at bedtime as needed 15)  Primidone 50 Mg Tabs (Primidone) .... Take 1/2 tablet by mouth two times a day 16)  Triamcinolone Acetonide 0.1 % Crea (Triamcinolone acetonide) .... Apply two times a day as directed  Other Orders: T- Hemoglobin A1C  ([id number]) T-TSH ([id number]) Influenza Vaccine MCR (57846) Flu Vaccine 28yrs + MEDICARE PATIENTS (N6295)  Patient Instructions: 1)  Please schedule a follow-up appointment in 3 months. Prescriptions: SIMVASTATIN 40 MG TABS (SIMVASTATIN) one by mouth qhs  #90 x 3   Entered and Authorized by:   [name] DO   Signed by:   [name] DO on 07/18/2010   Method used:   Faxed to ...       [facility] education [facility])       [address] [address]       [place], [place]  [address]       Ph: [phone]       Fax: [phone]   RxID:   [id number] COZAAR 100 MG  TABS (LOSARTAN POTASSIUM) Take 1 tablet by mouth once a day  #90 x 3   Entered and Authorized by:   [name] DO   Signed by:  Dondra Spry DO on 07/18/2010   Method used:   Faxed to ...       [facility])       [address] [address]       [place], [place]  [address]       Ph: [phone]       Fax: [phone]   RxID:   [id number] TOPROL XL 50 MG  TB24 (METOPROLOL SUCCINATE) Take 1 tablet by mouth once a day  #90 x 3   Entered and Authorized by:   [name] DO   Signed by:   [name] DO on 07/18/2010   Method used:   Faxed to ...       [facility])       [address] [address]       [place], [place]  [address]       Ph: [phone]       Fax: [phone]   RxID:   [id number] SYNTHROID 50 MCG  TABS (LEVOTHYROXINE SODIUM) Take 1 tablet by mouth once a day  #90 x 3   Entered and Authorized by:   [name] DO   Signed by:   [name] DO on 07/18/2010   Method used:   Faxed to ...       [facility] education [facility])       [address] [address]       [place], [place]  [address]       Ph: [phone]       Fax: [phone]   RxID:   [id number] FUROSEMIDE 40 MG  TABS (FUROSEMIDE) Take 1 tablet by mouth once a day as directed  #90 x 3   Entered and Authorized by:   [name] DO   Signed by:   [name] DO on 07/18/2010   Method used:   Faxed to ...       [facility])        [address] [address]       [place], [place]  [address]       Ph: [phone]       Fax: [phone]   RxID:   [id number] PREVACID 30 MG  CPDR (LANSOPRAZOLE) Take 1 tablet by mouth once a day  #90 x 3   Entered and Authorized by:   [name] DO   Signed by:   [name] DO on 07/18/2010   Method used:   Faxed to ...       [facility])       [address] [address]       [place], [place]  [address]       Ph: [phone]       Fax: [phone]   RxID:   [id number] TRIAMCINOLONE ACETONIDE 0.1 % CREA (TRIAMCINOLONE ACETONIDE) apply two times a day as directed  #15 grams x 0   Entered and Authorized by:   [name] DO   Signed by:   [name] DO on 07/18/2010   Method used:   Electronically to        [facility] (retail)       [address]       [place], [place]  [address]       Ph: [phone]       Fax: [phone]   RxID:   [id number] ALPRAZOLAM 0.25 MG TABS (ALPRAZOLAM) one tab by mouth bid as needed  #  60 x 3   Entered and Authorized by:   [name] DO   Signed by:   [name] DO on 07/18/2010   Method used:   Print then Give to Patient   RxID:   [id number] GABAPENTIN 300 MG CAPS (GABAPENTIN) one by mouth at bedtime as needed  #30 x 3   Entered and Authorized by:   [name] DO   Signed by:   [name] DO on 07/18/2010   Method used:   Electronically to        [facility] (retail)       [address]       [place], [place]  [address]       Ph: [phone]       Fax: [phone]   RxID:   [id number]   Current Allergies (reviewed today): No known allergies     Immunizations Administered:  Influenza Vaccine # 1:    Vaccine Type: Fluvax MCR    Site: left deltoid    Mfr: [employer]Smith[employer]    Dose: 0.5 ml    Route: IM    Given by: Glendell Docker CMA    Exp. Date: 05/12/2011    Lot #: [id number]    VIS given: 06/06/10 version given July 18, 2010.  Flu  Vaccine Consent Questions:    Do you have a history of severe allergic reactions to this vaccine? no    Any prior history of allergic reactions to egg and/or gelatin? no    Do you have a sensitivity to the preservative Thimersol? no    Do you have a past history of Guillan-Barre Syndrome? no    Do you currently have an acute febrile illness? no    Have you ever had a severe reaction to latex? no    Vaccine information given and explained to patient? yes    Are you currently pregnant? no

## 2010-12-12 NOTE — Medication Information (Signed)
Summary: rov/eac  Anticoagulant Therapy  Managed by: Bethena Midget, RN, BSN Referring MD: Dr. Velta Addison PCP: [name] DO Supervising MD: Johney Frame MD, Fayrene Fearing Indication 1: Pulmonary Embolism Lab Used: [facility] [place] Site: [address] INR POC 1.4 INR RANGE 2-3  Dietary changes: no    Health status changes: no    Bleeding/hemorrhagic complications: no    Recent/future hospitalizations: no    Any changes in medication regimen? yes       Details: Neurotin added by Dr. Artist Pais and Ropinirole was stopped. Vitamin B12 also discontinued.   Recent/future dental: no  Any missed doses?: no       Is patient compliant with meds? yes       Allergies: No Known Drug Allergies  Anticoagulation Management History:      The patient is taking warfarin and comes in today for a routine follow up visit.  Positive risk factors for bleeding include an age of 106 years or older.  The bleeding index is 'intermediate risk'.  Positive CHADS2 values include History of HTN and Age > 16 years old.  Her last INR was 2.7.  Anticoagulation responsible provider: Persephanie Laatsch MD, Fayrene Fearing.  INR POC: 1.4.  Cuvette Lot#: [id number].  Exp: 03/2011.    Anticoagulation Management Assessment/Plan:      The patient's current anticoagulation dose is Warfarin sodium 5 mg tabs: Use as directed by Anticoagulation Clinic.  The target INR is 2.0-3.0.  The next INR is due 02/10/2010.  Anticoagulation instructions were given to patient.  Results were reviewed/authorized by Bethena Midget, RN, BSN.  She was notified by Bethena Midget, RN, BSN.         Prior Anticoagulation Instructions: INR 2.9  Continue current dosing schedule of 1 tablet on Monday and 1/2 tablet all other days.  Return to clinic in 3 weeks. +  Current Anticoagulation Instructions: INR 1.4 Today take 1 pill then change dose to 1/2 pill everyday except 1 pill on Mondays and Wednesdays. Recheck in 2 weeks.

## 2010-12-12 NOTE — Consult Note (Signed)
Summary: [facility]  [facility]   Imported By: Lanelle Bal 11/28/2009 12:21:24  _____________________________________________________________________  External Attachment:    Type:   Image     Comment:   External Document

## 2010-12-12 NOTE — Letter (Signed)
Summary: [facility] -Vascular & Endovascular Surgery  [facility] -Vascular & Endovascular Surgery   Imported By: Maryln Gottron 10/10/2010 12:51:45  _____________________________________________________________________  External Attachment:    Type:   Image     Comment:   External Document

## 2010-12-12 NOTE — Medication Information (Signed)
Summary: rov/tm  Anticoagulant Therapy  Managed by: Eda Keys, PharmD Referring MD: Dr. Velta Addison PCP: [name] DO Supervising MD: Ladona Ridgel MD, Sharlot Gowda Indication 1: Pulmonary Embolism Lab Used: [facility] [place] Site: [address] INR POC 2.9 INR RANGE 2-3  Dietary changes: yes       Details: Patient is dieting  Health status changes: no    Bleeding/hemorrhagic complications: no    Recent/future hospitalizations: no    Any changes in medication regimen? no    Recent/future dental: no  Any missed doses?: no       Is patient compliant with meds? yes       Allergies: No Known Drug Allergies  Anticoagulation Management History:      The patient is taking warfarin and comes in today for a routine follow up visit.  Positive risk factors for bleeding include an age of 75 years or older.  The bleeding index is 'intermediate risk'.  Positive CHADS2 values include History of HTN and Age > 2 years old.  Her last INR was 2.7.  Anticoagulation responsible provider: Ladona Ridgel MD, Sharlot Gowda.  INR POC: 2.9.  Cuvette Lot#: [id number].  Exp: 02/2011.    Anticoagulation Management Assessment/Plan:      The patient's current anticoagulation dose is Warfarin sodium 5 mg tabs: Use as directed by Anticoagulation Clinic.  The target INR is 2.0-3.0.  The next INR is due 01/25/2010.  Anticoagulation instructions were given to patient.  Results were reviewed/authorized by Eda Keys, PharmD.  She was notified by Eda Keys.         Prior Anticoagulation Instructions: INR 1.7 Today take 1 pill then change dose back to 1/2 pill everyday except 1 pill on Mondays. Recheck in 2 weeks.   Current Anticoagulation Instructions: INR 2.9  Continue current dosing schedule of 1 tablet on Monday and 1/2 tablet all other days.  Return to clinic in 3 weeks. +

## 2010-12-13 ENCOUNTER — Ambulatory Visit (INDEPENDENT_AMBULATORY_CARE_PROVIDER_SITE_OTHER): Payer: Medicare Other | Admitting: Family

## 2010-12-13 ENCOUNTER — Encounter: Payer: Self-pay | Admitting: Family

## 2010-12-13 DIAGNOSIS — J019 Acute sinusitis, unspecified: Secondary | ICD-10-CM

## 2010-12-14 ENCOUNTER — Ambulatory Visit: Payer: Medicare Other | Attending: Internal Medicine | Admitting: Physical Therapy

## 2010-12-14 DIAGNOSIS — IMO0001 Reserved for inherently not codable concepts without codable children: Secondary | ICD-10-CM | POA: Insufficient documentation

## 2010-12-14 DIAGNOSIS — H8110 Benign paroxysmal vertigo, unspecified ear: Secondary | ICD-10-CM | POA: Insufficient documentation

## 2010-12-14 NOTE — Assessment & Plan Note (Signed)
Summary: NP/LP   Vital Signs:  Patient profile:   77 year old female Height:      61 inches (154.94 cm) Weight:      175.2 pounds (79.64 kg) BMI:     33.22 Temp:     98.2 degrees F (36.78 degrees C) oral Pulse rate:   66 / minute BP sitting:   120 / 65  (right arm)  Vitals Entered By: Baxter Hire) (October 20, 2010 2:30 PM) CC: right knee pain Pain Assessment Patient in pain? yes     Location: right knee Intensity: 4 Type: throbbing Nutritional Status BMI of > 30 = obese  Does patient need assistance? Functional Status Self care Ambulation Normal   Primary Care Provider:  DThomos Lemons DO  CC:  right knee pain.  History of Present Illness: 77 y/o F here for R > L knee pain  Patient reports > 1 year history of bilateral knee pain.   Thinks she may have first noticed this in August 2010 when walking a lot in [place] but also reports having had cortisone injection into right knee several years ago. No known injury No catching, locking, giving out. Had doppler ultrasound 1 month ago that was negative for DVTs. Pain worse over past week and especially last night Pain is primarily medial joint line of R > L knee. Hurts to press on this area. Ibuprofen has helped some. Likes to dance for exercise. Given an rx for voltaren gel but has not tried yet.  Problems Prior to Update: 1)  Benign Positional Vertigo  (ICD-386.11) 2)  Anemia  (ICD-285.9) 3)  Knee Pain, Right  (ICD-719.46) 4)  Carotid Artery Disease  (ICD-433.10) 5)  Neuropathy  (ICD-355.9) 6)  Stress Reaction, Acute, With Emotional Disturbance  (ICD-308.0) 7)  Diarrhea  (ICD-787.91) 8)  Contusion of Unspecified Part of Trunk  (ICD-922.9) 9)  Bronchitis, Acute  (ICD-466.0) 10)  Pulmonary Embolism  (ICD-415.1) 11)  Knee Pain, Left  (ICD-719.46) 12)  Pe  (ICD-415.19) 13)  Flank Pain, Right  (ICD-789.09) 14)  Internal Hemorrhoids  (ICD-455.0) 15)  Hypertension  (ICD-401.9) 16)  Hypothyroidism   (ICD-244.9) 17)  Hyperlipidemia  (ICD-272.4) 18)  Hot Flashes  (ICD-627.2) 19)  Unspecified Subjective Visual Disturbance  (ICD-368.10) 20)  Varicose Veins Lower Extremities W/inflammation  (ICD-454.1) 21)  Tremor, Essential  (ICD-333.1) 22)  Rhinosinusitis, Acute  (ICD-461.8) 23)  Gout  (ICD-274.9) 24)  Osteoarthritis, Hx of  (ICD-V13.5) 25)  Hx, Personal, Peptic Ulcer Disease  (ICD-V12.71) 26)  Diverticulosis, Colon  (ICD-562.10) 27)  Anxiety  (ICD-300.00) 28)  Colonic Polyps, Hx of  (ICD-V12.72) 29)  Gerd  (ICD-530.81) 30)  Diverticulitis, Hx of  (ICD-V12.79) 31)  Depression  (ICD-311) 32)  Diabetes Mellitus, Type II, Borderline  (ICD-790.29) 33)  Restless Leg Syndrome  (ICD-333.94)  Medications Prior to Update: 1)  Prevacid 30 Mg  Cpdr (Lansoprazole) .... Take 1 Tablet By Mouth Once A Day 2)  Furosemide 40 Mg  Tabs (Furosemide) .... Take 1 Tablet By Mouth Once A Day As Directed 3)  Synthroid 50 Mcg  Tabs (Levothyroxine Sodium) .... Take 1 Tablet By Mouth Once A Day 4)  Toprol Xl 50 Mg  Tb24 (Metoprolol Succinate) .... Take 1 Tablet By Mouth Once A Day 5)  Cozaar 100 Mg  Tabs (Losartan Potassium) .... Take 1 Tablet By Mouth Once A Day 6)  Astelin 137 Mcg/spray  Soln (Azelastine Hcl) .... One Spray Each Nostril Once Daily As Needed 7)  Simvastatin  40 Mg Tabs (Simvastatin) .... One By Mouth Qhs 8)  Calcium Carbonate-Vitamin D 600-400 Mg-Unit  Tabs (Calcium Carbonate-Vitamin D) .... Take 1 Tablet By Mouth Once A Day 9)  Vitamin D 2000 Unit Tabs (Cholecalciferol) .... Take 1 Tablet By Mouth Once A Day 10)  Pataday 0.2 % Soln (Olopatadine Hcl) .... One Drop Each Eye Once Daily 11)  Mucinex Dm Maximum Strength 60-1200 Mg Xr12h-Tab (Dextromethorphan-Guaifenesin) .... Take 1 Tablet By Mouth Two Times A Day As Needed 12)  Alprazolam 0.25 Mg Tabs (Alprazolam) .... Take 1 Tablet By Mouth Two Times A Day As Needed 13)  Gabapentin 300 Mg Caps (Gabapentin) .... One By Mouth At Bedtime As  Needed 14)  Triamcinolone Acetonide 0.1 % Crea (Triamcinolone Acetonide) .... Apply Two Times A Day As Directed 15)  Uloric 40 Mg Tabs (Febuxostat) .... One By Mouth Qd 16)  Voltaren 1 % Gel (Diclofenac Sodium) .... Apply 2 Grams Qid 17)  Cefuroxime Axetil 500 Mg Tabs (Cefuroxime Axetil) .... One By Mouth Two Times A Day  Allergies (verified): No Known Drug Allergies  Past History:  Family History: Last updated: 07/18/2010 sister with stomach cancer, treated with surgery   Father died at age 71 with MI       Daughter recently died of cerebral aneurysm     Social History: Last updated: 07/18/2010 Single Widowed     Alcohol use-yes     Tobacco Use - Former, started age 82, quit 1990, max 1 PPD      Physical Exam  General:  alert, well-developed, and well-nourished.   Msk:  R knee: Mild swelling.  No gross deformity, bruising otherwise. Mod TTP medial joint line.  No lateral joint line, pes TTP or other TTP about knee. ROM 0-110 degrees. Neg ant/post drawers, Neg lachmanns.  Neg mcmurrays, apleys, patellar grind. NVI distally.  L knee: Mild swelling.  No gross deformity, bruising otherwise. Mild TTP medial joint line.  No lateral joint line, pes TTP or other TTP about knee. ROM 0-110 degrees. Neg ant/post drawers, Neg lachmanns.  Neg mcmurrays, apleys, patellar grind. NVI distally.   Impression & Recommendations:  Problem # 1:  KNEE PAIN, RIGHT (ICD-719.46) Assessment Deteriorated  Reviewed patient's x-rays of right knee (non weight-bearing) which show moderate medial DJD with patellofemoral component as well (no sunrise views though and symptoms more indicative of medial compartment DJD).  Try cortisone injections as she has done well with these before.  Use tylenol and voltaren gel for pain.  Discussed to only use ibuprofen if voltaren gel is not working but not to use both at the same time.  Icing or heat, whichever helps more.  Glucosamine sulfate shown to help for  mod-severe DJD.  see instructions for further.  After informed written consent patient was seated on the exam table and bilateral knees were prepped with alcohol swabs.  Utilizing an anterolateral approach for right and anteromedial approach for left knee, these were each injected with 3:1 marcaine:depomedrol.  Patient tolerated the procedures well without any immediate complications.   Orders: Joint Aspirate / Injection, Large (20610)  Problem # 2:  KNEE PAIN, LEFT (NFA-213.08) Assessment: New Left knee pain not as bad as right and most likely DJD as well.  No injury.  Proceed with injection and measures noted in #1 above for this.  After informed written consent patient was seated on the exam table and bilateral knees were prepped with alcohol swabs.  Utilizing an anterolateral approach for right and anteromedial approach for  left knee, these were each injected with 3:1 marcaine:depomedrol.  Patient tolerated the procedures well without any immediate complications.   Complete Medication List: 1)  Prevacid 30 Mg Cpdr (Lansoprazole) .... Take 1 tablet by mouth once a day 2)  Furosemide 40 Mg Tabs (Furosemide) .... Take 1 tablet by mouth once a day as directed 3)  Synthroid 50 Mcg Tabs (Levothyroxine sodium) .... Take 1 tablet by mouth once a day 4)  Toprol Xl 50 Mg Tb24 (Metoprolol succinate) .... Take 1 tablet by mouth once a day 5)  Cozaar 100 Mg Tabs (Losartan potassium) .... Take 1 tablet by mouth once a day 6)  Astelin 137 Mcg/spray Soln (Azelastine hcl) .... One spray each nostril once daily as needed 7)  Simvastatin 40 Mg Tabs (Simvastatin) .... One by mouth qhs 8)  Calcium Carbonate-vitamin D 600-400 Mg-unit Tabs (Calcium carbonate-vitamin d) .... Take 1 tablet by mouth once a day 9)  Vitamin D 2000 Unit Tabs (Cholecalciferol) .... Take 1 tablet by mouth once a day 10)  Pataday 0.2 % Soln (Olopatadine hcl) .... One drop each eye once daily 11)  Mucinex Dm Maximum Strength 60-1200 Mg  Xr12h-tab (Dextromethorphan-guaifenesin) .... Take 1 tablet by mouth two times a day as needed 12)  Alprazolam 0.25 Mg Tabs (Alprazolam) .... Take 1 tablet by mouth two times a day as needed 13)  Gabapentin 300 Mg Caps (Gabapentin) .... One by mouth at bedtime as needed 14)  Triamcinolone Acetonide 0.1 % Crea (Triamcinolone acetonide) .... Apply two times a day as directed 15)  Uloric 40 Mg Tabs (Febuxostat) .... One by mouth qd 16)  Voltaren 1 % Gel (Diclofenac sodium) .... Apply 2 grams qid 17)  Cefuroxime Axetil 500 Mg Tabs (Cefuroxime axetil) .... One by mouth two times a day  Patient Instructions: 1)  You have moderate arthritis of your knees. 2)  You were given cortisone injections into both knees. 3)  Continue to use the voltaren gel as needed. 4)  Tylenol and ibuprofen as needed also. 5)  Glucosamine sulfate 1500mg  daily has been shown to help pain in arthritis also. 6)  It's important that you continue to stay active. 7)  Water aerobics and low resistance cycling would be good ideas. 8)  Consider physical therapy for muscle strength/ROM exercises 9)  Heat or ice as needed to help with pain. 10)  Wear supportive shoes with good arch support (tennis shoes). 11)  Follow up with me as needed.   Orders Added: 1)  New Patient Level III [04540] 2)  Joint Aspirate / Injection, Large [20610]

## 2010-12-14 NOTE — Assessment & Plan Note (Signed)
Summary: 3 month fu/dt   Vital Signs:  Patient profile:   77 year old female Height:      61 inches Weight:      174.75 pounds BMI:     33.14 O2 Sat:      98 % on Room air Temp:     98.0 degrees F oral Pulse rate:   68 / minute Resp:     20 per minute BP sitting:   100 / 60  (right arm) Cuff size:   large  Vitals Entered By: Glendell Docker CMA (October 19, 2010 3:31 PM)  O2 Flow:  Room air CC: 3 Month Is Patient Diabetic? No Pain Assessment Patient in pain? yes     Location: knees Intensity: 7 Type: Throbbing Onset of pain  Intermittent Comments discuss the propanolol 60mg , she states she is having "foggy brain", bilateral intermittent throbbing knee pain, seen in [place] about one month ago, and did not have any blood clots,    Primary Care Avalynne Diver:  Dondra Spry DO  CC:  3 Month.  History of Present Illness: 77 y/o white female for f/u pt seen by specialist re:  tremor  propranolol stopped due to fatigue,  " feels foggy"  htn - stable  PE - anticoagulated.  no bleeding  also c/o sinus congestion concerned she has sinue congestion intermittent cough  c/o right knee pain - no redness, mild swelling pt thinks pain from gout  Preventive Screening-Counseling & Management  Alcohol-Tobacco     Smoking Status: quit  Allergies (verified): No Known Drug Allergies  Past History:  Past Medical History: Current Problems:  HYPERTENSION (ICD-401.9) HYPOTHYROIDISM (ICD-244.9)   HYPERLIPIDEMIA (ICD-272.4)  ? H/O CAD (cath in 2001)     HOT FLASHES (ICD-627.2)  DYSPNEA ON EXERTION (ICD-786.09)  UNSPECIFIED SUBJECTIVE VISUAL DISTURBANCE (ICD-368.10) VARICOSE VEINS LOWER EXTREMITIES W/INFLAMMATION (ICD-454.1) TREMOR, ESSENTIAL (ICD-333.1) RHINOSINUSITIS, ACUTE (ICD-461.8) GOUT (ICD-274.9) OSTEOARTHRITIS, HX OF (ICD-V13.5)  HX, PERSONAL, PEPTIC ULCER DISEASE (ICD-V12.71) DIVERTICULOSIS, COLON (ICD-562.10) ANXIETY (ICD-300.00) COLONIC POLYPS, HX OF  (ICD-V12.72) GERD (ICD-530.81) DIVERTICULITIS, HX OF (ICD-V12.79) DEPRESSION (ICD-311) DIABETES MELLITUS, TYPE II, BORDERLINE (ICD-790.29) RESTLESS LEG SYNDROME (ICD-333.94) hyperplastic colon polyp, 2004; another colon polyp removed 2009 by Dr. in [place] unclear pathology Pulomonary Embolism- 07/2009  Family History: sister with stomach cancer, treated with surgery   Father died at age 76 with MI       Daughter recently died of cerebral aneurysm      Social History: Single Widowed     Alcohol use-yes     Tobacco Use - Former, started age 7, quit 1990, max 1 PPD       Review of Systems  The patient denies fever and dyspnea on exertion.         intermittent dizziness, worse with head movements  Physical Exam  General:  alert, well-developed, and well-nourished.   Eyes:  pupils equal, pupils round, and pupils reactive to light.   Ears:  R ear normal and L ear normal.   Neck:  supple and no masses.  no carotid bruits.   Lungs:  normal respiratory effort, normal breath sounds, no crackles, and no wheezes.   Heart:  normal rate, regular rhythm, no murmur, and no gallop.   Msk:  right knee - no redness,  no significant effusion,  mild crepitus Extremities:  No lower extremity edema  Neurologic:  cranial nerves II-XII intact and gait normal.   Psych:  normally interactive, good eye contact, not anxious appearing, and not  depressed appearing.     Impression & Recommendations:  Problem # 1:  KNEE PAIN, RIGHT (ICD-719.46)  probable DJD.  refer  to ortho for co Orders: T-Basic Metabolic Panel [phone]) T-Uric Acid (Blood) [phone]) T-Knee Right 2 view (73560TC) Orthopedic Referral (Ortho)  Her updated medication list for this problem includes:    Vicodin 5-500 Mg Tabs (Hydrocodone-acetaminophen) .Marland Kitchen... 1 tab by mouth three times a day as needed severe pain  Problem # 2:  HYPOTHYROIDISM (ICD-244.9) Assessment: Deteriorated  Her updated medication list for  this problem includes:    Synthroid 50 Mcg Tabs (Levothyroxine sodium) .Marland Kitchen... Take 1 tablet by mouth once a day  Orders: T-TSH ([id number])  Problem # 3:  BENIGN POSITIONAL VERTIGO (ICD-386.11)  Orders: Physical Therapy Referral (PT)  Problem # 4:  HYPERTENSION (ICD-401.9) Assessment: Unchanged  Her updated medication list for this problem includes:    Furosemide 40 Mg Tabs (Furosemide) .Marland Kitchen... Take 1 tablet by mouth once a day as directed    Toprol Xl 50 Mg Tb24 (Metoprolol succinate) .Marland Kitchen... Take 1 tablet by mouth once a day    Cozaar 100 Mg Tabs (Losartan potassium) .Marland Kitchen... Take 1 tablet by mouth once a day  BP today: 100/60 Prior BP: 116/60 (07/18/2010)  Labs Reviewed: K+: 4.2 (07/21/2010) Creat: : 1.02 (07/21/2010)   Chol: 103 (10/26/2008)   HDL: 52.0 (10/26/2008)   LDL: 37 (10/26/2008)   TG: 71 (10/26/2008)  Problem # 5:  SINUSITIS, ACUTE (ICD-461.9)  Her updated medication list for this problem includes:    Astelin 137 Mcg/spray Soln (Azelastine hcl) ..... One spray each nostril once daily as needed    Mucinex Dm Maximum Strength 60-1200 Mg Xr12h-tab (Dextromethorphan-guaifenesin) .Marland Kitchen... Take 1 tablet by mouth two times a day as needed    Cefuroxime Axetil 500 Mg Tabs (Cefuroxime axetil) ..... One by mouth two times a day  Instructed on treatment. Call if symptoms persist or worsen.   Complete Medication List: 1)  Prevacid 30 Mg Cpdr (Lansoprazole) .... Take 1 tablet by mouth once a day 2)  Furosemide 40 Mg Tabs (Furosemide) .... Take 1 tablet by mouth once a day as directed 3)  Synthroid 50 Mcg Tabs (Levothyroxine sodium) .... Take 1 tablet by mouth once a day 4)  Toprol Xl 50 Mg Tb24 (Metoprolol succinate) .... Take 1 tablet by mouth once a day 5)  Cozaar 100 Mg Tabs (Losartan potassium) .... Take 1 tablet by mouth once a day 6)  Astelin 137 Mcg/spray Soln (Azelastine hcl) .... One spray each nostril once daily as needed 7)  Simvastatin 40 Mg Tabs (Simvastatin) .... One by  mouth qhs 8)  Calcium Carbonate-vitamin D 600-400 Mg-unit Tabs (Calcium carbonate-vitamin d) .... Take 1 tablet by mouth once a day 9)  Vitamin D 2000 Unit Tabs (Cholecalciferol) .... Take 1 tablet by mouth once a day 10)  Pataday 0.2 % Soln (Olopatadine hcl) .... One drop each eye once daily 11)  Mucinex Dm Maximum Strength 60-1200 Mg Xr12h-tab (Dextromethorphan-guaifenesin) .... Take 1 tablet by mouth two times a day as needed 12)  Alprazolam 0.25 Mg Tabs (Alprazolam) .... Take 1 tablet by mouth two times a day as needed 13)  Gabapentin 300 Mg Caps (Gabapentin) .... One by mouth at bedtime as needed 14)  Triamcinolone Acetonide 0.1 % Crea (Triamcinolone acetonide) .... Apply two times a day as directed 15)  Uloric 40 Mg Tabs (Febuxostat) .... One by mouth qd 16)  Voltaren 1 % Gel (Diclofenac sodium) .... Apply 2 grams qid  17)  Cefuroxime Axetil 500 Mg Tabs (Cefuroxime axetil) .... One by mouth two times a day 18)  Vicodin 5-500 Mg Tabs (Hydrocodone-acetaminophen) .Marland Kitchen.. 1 tab by mouth three times a day as needed severe pain  Other Orders: T- Hemoglobin A1C ([id number]) T-CBC No Diff ([id number])  Patient Instructions: 1)  Please schedule a follow-up appointment in 4 months. Prescriptions: CEFUROXIME AXETIL 500 MG TABS (CEFUROXIME AXETIL) one by mouth two times a day  #14 x 0   Entered and Authorized by:   [name] DO   Signed by:   [name] DO on 10/19/2010   Method used:   Print then Give to Patient   RxID:   [id number] VOLTAREN 1 % GEL (DICLOFENAC SODIUM) apply 2 grams qid  #2 pk x 3   Entered and Authorized by:   [name] DO   Signed by:   [name] DO on 10/19/2010   Method used:   Print then Give to Patient   RxID:   [id number] ULORIC 40 MG TABS (FEBUXOSTAT) one by mouth qd  #90 x 3   Entered and Authorized by:   [name] DO   Signed by:   [name] DO on 10/19/2010   Method used:   Print then Give to Patient   RxID:    [id number]    Orders Added: 1)  T-Basic Metabolic Panel [[id number]] 2)  T-Uric Acid (Blood) [[id number]] 3)  T- Hemoglobin A1C [83036-23375] 4)  T-TSH [[id number]] 5)  T-CBC No Diff [85027-10000] 6)  T-Knee Right 2 view [73560TC] 7)  Orthopedic Referral [Ortho] 8)  Physical Therapy Referral [PT] 9)  Est. Patient Level IV [29518]    Current Allergies (reviewed today): No known allergies     Preventive Care Screening  Mammogram:    Date:  07/31/2010    Results:  normal   Pap Smear:    Date:  07/31/2010    Results:  normal

## 2010-12-14 NOTE — Assessment & Plan Note (Signed)
Summary: UTI/MHF--rm 4   Vital Signs:  Patient profile:   77 year old female Height:      61 inches Weight:      176 pounds BMI:     33.38 O2 Sat:      96 % Temp:     97.5 degrees F oral Pulse rate:   90 / minute Pulse rhythm:   regular Resp:     18 per minute BP sitting:   122 / 64  (right arm) Cuff size:   large  Vitals Entered By: Mervin Kung CMA [facility]) (November 07, 2010 1:39 PM) Is Patient Diabetic? Yes Pain Assessment Patient in pain? no      Comments Pt states it feels like something is getting ready to fall out. Also still having productive cough and chest congestion. Pt has completed Cerfuoxime. Nicki Guadalajara Fergerson CMA [facility])  November 07, 2010 1:50 PM    Primary Care Provider:  Dondra Spry DO   History of Present Illness: Christina Mckinney is a 30 year odl female who presents today with chief complaint of urinary frequency.    1)Urinary Frequency- Feels like "it is going to fall out."  + straining to urinate.  Then able to empty her bladder.  Denies fever.  Denies dyuria.  + bilateral low back pain.    2) Sinus congestion-  was seen on 12/7 and treated with 7 day treatment with cefuroxime.  Notes post-nasal drip.  bringing up yellow phlegm.    3) Hot flashes/ Diaphoresis-  Using gabapentin which is working well for her.      Allergies (verified): No Known Drug Allergies  Past History:  Past Medical History: Last updated: 10/19/2010 Current Problems:  HYPERTENSION (ICD-401.9) HYPOTHYROIDISM (ICD-244.9)   HYPERLIPIDEMIA (ICD-272.4)  ? H/O CAD (cath in 2001)     HOT FLASHES (ICD-627.2)  DYSPNEA ON EXERTION (ICD-786.09)  UNSPECIFIED SUBJECTIVE VISUAL DISTURBANCE (ICD-368.10) VARICOSE VEINS LOWER EXTREMITIES W/INFLAMMATION (ICD-454.1) TREMOR, ESSENTIAL (ICD-333.1) RHINOSINUSITIS, ACUTE (ICD-461.8) GOUT (ICD-274.9) OSTEOARTHRITIS, HX OF (ICD-V13.5)  HX, PERSONAL, PEPTIC ULCER DISEASE (ICD-V12.71) DIVERTICULOSIS, COLON (ICD-562.10) ANXIETY  (ICD-300.00) COLONIC POLYPS, HX OF (ICD-V12.72) GERD (ICD-530.81) DIVERTICULITIS, HX OF (ICD-V12.79) DEPRESSION (ICD-311) DIABETES MELLITUS, TYPE II, BORDERLINE (ICD-790.29) RESTLESS LEG SYNDROME (ICD-333.94) hyperplastic colon polyp, 2004; another colon polyp removed 2009 by Dr. in [place] unclear pathology Pulomonary Embolism- 07/2009  Past Surgical History: Last updated: 07/18/2010 Cholecystectomy  Hysterectomy Deviated Septum     History of shoulder surgery          Review of Systems       see HPI  Physical Exam  General:  Well-developed,well-nourished,in no acute distress; alert,appropriate and cooperative throughout examination Ears:  External ear exam shows no significant lesions or deformities.  Otoscopic examination reveals clear canals, tympanic membranes are intact bilaterally without bulging, retraction, inflammation or discharge. Hearing is grossly normal bilaterally. Lungs:  Normal respiratory effort, chest expands symmetrically. Lungs are clear to auscultation, no crackles or wheezes. Heart:  Normal rate and regular rhythm. S1 and S2 normal without gallop, murmur, click, rub or other extra sounds. Genitalia:  Uterus is surgically absent. + Bladder prolapse is noted on exam with valsalva (grade 1-2)normal introitus, no external lesions, and no vaginal discharge.   Psych:  Cognition and judgment appear intact. Alert and cooperative with normal attention span and concentration. No apparent delusions, illusions, hallucinations   Impression & Recommendations:  Problem # 1:  UTI (ICD-599.0) Assessment New Will plan to treat with levaquin for UTI.  This will also have good  sinus coverage given her complaint of ongoing sinus drainage.  Send urine for culture The following medications were removed from the medication list:    Cefuroxime Axetil 500 Mg Tabs (Cefuroxime axetil) ..... One by mouth two times a day Her updated medication list for this problem includes:     Levaquin 500 Mg Tabs (Levofloxacin) ..... One tablet by mouth daily for 7 days  Orders: T-Culture, Urine (04540-98119) UA Dipstick w/o Micro (manual) (14782) Specimen Handling (99000) Prescription Created Electronically [id number])  Problem # 2:  BLADDER PROLAPSE (ICD-596.9) Assessment: New Offered referral to urology, she declines at this time.  Recommended Kegel exercises several times a day.  I recommended that she schedule a follow up appointment with her GYN  Complete Medication List: 1)  Prevacid 30 Mg Cpdr (Lansoprazole) .... Take 1 tablet by mouth once a day 2)  Furosemide 40 Mg Tabs (Furosemide) .... Take 1 tablet by mouth once a day as directed 3)  Synthroid 50 Mcg Tabs (Levothyroxine sodium) .... Take 1 tablet by mouth once a day 4)  Toprol Xl 50 Mg Tb24 (Metoprolol succinate) .... Take 1 tablet by mouth once a day 5)  Cozaar 100 Mg Tabs (Losartan potassium) .... Take 1 tablet by mouth once a day 6)  Astelin 137 Mcg/spray Soln (Azelastine hcl) .... One spray each nostril once daily as needed 7)  Simvastatin 40 Mg Tabs (Simvastatin) .... One by mouth qhs 8)  Calcium Carbonate-vitamin D 600-400 Mg-unit Tabs (Calcium carbonate-vitamin d) .... Take 1 tablet by mouth once a day 9)  Vitamin D 2000 Unit Tabs (Cholecalciferol) .... Take 1 tablet by mouth once a day 10)  Pataday 0.2 % Soln (Olopatadine hcl) .... One drop each eye once daily 11)  Mucinex Dm Maximum Strength 60-1200 Mg Xr12h-tab (Dextromethorphan-guaifenesin) .... Take 1 tablet by mouth two times a day as needed 12)  Alprazolam 0.25 Mg Tabs (Alprazolam) .... Take 1 tablet by mouth two times a day as needed 13)  Gabapentin 300 Mg Caps (Gabapentin) .... One by mouth at bedtime as needed 14)  Triamcinolone Acetonide 0.1 % Crea (Triamcinolone acetonide) .... Apply two times a day as directed 15)  Uloric 40 Mg Tabs (Febuxostat) .... One by mouth qd 16)  Voltaren 1 % Gel (Diclofenac sodium) .... Apply 2 grams qid 17)  Vicodin  5-500 Mg Tabs (Hydrocodone-acetaminophen) .Marland Kitchen.. 1 tab by mouth three times a day as needed severe pain 18)  Levaquin 500 Mg Tabs (Levofloxacin) .... One tablet by mouth daily for 7 days  Patient Instructions: 1)  Call if your symptoms worsen, or if they do not improve. 2)  Arrange a follow up appointment with Dr. Artist Pais in April. Prescriptions: LEVAQUIN 500 MG TABS (LEVOFLOXACIN) one tablet by mouth daily for 7 days  #7 x 0   Entered and Authorized by:   Lemont Fillers FNP   Signed by:   Lemont Fillers FNP on 11/07/2010   Method used:   Electronically to        [facility] (retail)       [address]       [place], [place]  [address]       Ph: [phone]       Fax: [phone]   RxID:   [id number]    Orders Added: 1)  T-Culture, Urine [[id number]] 2)  UA Dipstick w/o Micro (manual) [81002] 3)  Specimen Handling [99000] 4)  Prescription Created Electronically [phone]  5)  Est. Patient Level III [16109]    Current Allergies (reviewed today): No known allergies       Vital Signs:  Patient Profile:   77 year old female Height:     61 inches (154.94 cm) Weight:      176 pounds BMI:     33.38 O2 Sat:      96 % O2 treatment:    Room Air Temp:     97.5 degrees F oral Pulse rate:   90 / minute Pulse rhythm:   regular Resp:     18 per minute BP sitting:   122 / 64 Cuff size:   large                Laboratory Results   Urine Tests   Date/Time Reported: Mervin Kung CMA (AAMA)  November 07, 2010 2:05 PM   Routine Urinalysis   Color: orange Appearance: Cloudy Glucose: negative   (Normal Range: Negative) Bilirubin: negative   (Normal Range: Negative) Ketone: negative   (Normal Range: Negative) Spec. Gravity: 1.010   (Normal Range: 1.003-1.035) Blood: large   (Normal Range: Negative) pH: 6.5   (Normal Range: 5.0-8.0) Protein: 100   (Normal Range: Negative) Urobilinogen: 0.2   (Normal Range: 0-1) Nitrite:  positive   (Normal Range: Negative) Leukocyte Esterace: large   (Normal Range: Negative)    Comments: Urine cultured. Nicki Guadalajara Fergerson CMA [facility])  November 07, 2010 2:05 PM

## 2010-12-14 NOTE — Progress Notes (Signed)
Summary: Voltaren Gel Back Order  Phone Note From Pharmacy Call back at [phone] ext [phone]   Caller: [facility] Summary of Call: fax  recieved for request fo new rx for Voltaren Gel %1, fax states it is on backorder with the manufacturer until 12/11/2010  Initial call taken by: Glendell Docker CMA,  November 29, 2010 2:26 PM  Follow-up for Phone Call        ok to resend rx Follow-up by: [name] DO,  November 30, 2010 6:14 PM  Additional Follow-up for Phone Call Additional follow up Details #1::        rx re-sent, note to pharmacy to dispense when medication becomes available Additional Follow-up by: Glendell Docker CMA,  December 01, 2010 9:34 AM    Prescriptions: VOLTAREN 1 % GEL (DICLOFENAC SODIUM) apply 2 grams qid  #2 pk x 3   Entered by:   Glendell Docker CMA   Authorized by:   [name] DO   Signed by:   Glendell Docker CMA on 12/01/2010   Method used:   Faxed to ...       [facility] [employer] [facility])       [address] [address]       [place], [place]  [address]       Ph: [phone]       Fax: [phone]   RxID:   [id number]

## 2010-12-14 NOTE — Assessment & Plan Note (Signed)
Summary: PAIN AND FLUID ON KNEE/LP   Vital Signs:  Patient profile:   77 year old female Height:      61 inches (154.94 cm) Weight:      177.0 pounds (80.45 kg) BMI:     33.56  Vitals Entered By: Kathi Simpers [facility]) (October 27, 2010 10:49 AM) CC: pain and fluid on right knee Pain Assessment Patient in pain? yes     Location: right knee Intensity: 8 Type: throbbing Nutritional Status BMI of > 30 = obese  Does patient need assistance? Functional Status Self care Ambulation Normal   Primary Care Provider:  DThomos Lemons DO  CC:  pain and fluid on right knee.  History of Present Illness: 77 y/o F here for R knee pain  Patient was last here 1 week ago with complaints of R>L knee pain X-rays showed moderate medial DJD Given injections into both knees - left one is much better Reports was doing some home exercises and pain has seemed to actually worsen medial right knee Now reports some catching in right knee and worsening of her pain despite injection. Again, no known injury Pain worse at nighttime. Taken ibuprofen but not voltaren gel yet. No true locking or giving out. Had doppler ultrasound 1 month ago that was negative for DVTs. Likes to dance for exercise.  Habits & Providers  Alcohol-Tobacco-Diet     Alcohol drinks/day: occassionally     Tobacco Status: quit  Problems Prior to Update: 1)  Knee Pain, Left  (ICD-719.46) 2)  Benign Positional Vertigo  (ICD-386.11) 3)  Anemia  (ICD-285.9) 4)  Knee Pain, Right  (ICD-719.46) 5)  Carotid Artery Disease  (ICD-433.10) 6)  Neuropathy  (ICD-355.9) 7)  Stress Reaction, Acute, With Emotional Disturbance  (ICD-308.0) 8)  Diarrhea  (ICD-787.91) 9)  Contusion of Unspecified Part of Trunk  (ICD-922.9) 10)  Bronchitis, Acute  (ICD-466.0) 11)  Pulmonary Embolism  (ICD-415.1) 12)  Knee Pain, Left  (ICD-719.46) 13)  Pe  (ICD-415.19) 14)  Flank Pain, Right  (ICD-789.09) 15)  Internal Hemorrhoids  (ICD-455.0) 16)   Hypertension  (ICD-401.9) 17)  Hypothyroidism  (ICD-244.9) 18)  Hyperlipidemia  (ICD-272.4) 19)  Hot Flashes  (ICD-627.2) 20)  Unspecified Subjective Visual Disturbance  (ICD-368.10) 21)  Varicose Veins Lower Extremities W/inflammation  (ICD-454.1) 22)  Tremor, Essential  (ICD-333.1) 23)  Rhinosinusitis, Acute  (ICD-461.8) 24)  Gout  (ICD-274.9) 25)  Osteoarthritis, Hx of  (ICD-V13.5) 26)  Hx, Personal, Peptic Ulcer Disease  (ICD-V12.71) 27)  Diverticulosis, Colon  (ICD-562.10) 28)  Anxiety  (ICD-300.00) 29)  Colonic Polyps, Hx of  (ICD-V12.72) 30)  Gerd  (ICD-530.81) 31)  Diverticulitis, Hx of  (ICD-V12.79) 32)  Depression  (ICD-311) 33)  Diabetes Mellitus, Type II, Borderline  (ICD-790.29) 34)  Restless Leg Syndrome  (ICD-333.94)  Medications Prior to Update: 1)  Prevacid 30 Mg  Cpdr (Lansoprazole) .... Take 1 Tablet By Mouth Once A Day 2)  Furosemide 40 Mg  Tabs (Furosemide) .... Take 1 Tablet By Mouth Once A Day As Directed 3)  Synthroid 50 Mcg  Tabs (Levothyroxine Sodium) .... Take 1 Tablet By Mouth Once A Day 4)  Toprol Xl 50 Mg  Tb24 (Metoprolol Succinate) .... Take 1 Tablet By Mouth Once A Day 5)  Cozaar 100 Mg  Tabs (Losartan Potassium) .... Take 1 Tablet By Mouth Once A Day 6)  Astelin 137 Mcg/spray  Soln (Azelastine Hcl) .... One Spray Each Nostril Once Daily As Needed 7)  Simvastatin 40 Mg Tabs (Simvastatin) .Marland KitchenMarland KitchenMarland Kitchen  One By Mouth Qhs 8)  Calcium Carbonate-Vitamin D 600-400 Mg-Unit  Tabs (Calcium Carbonate-Vitamin D) .... Take 1 Tablet By Mouth Once A Day 9)  Vitamin D 2000 Unit Tabs (Cholecalciferol) .... Take 1 Tablet By Mouth Once A Day 10)  Pataday 0.2 % Soln (Olopatadine Hcl) .... One Drop Each Eye Once Daily 11)  Mucinex Dm Maximum Strength 60-1200 Mg Xr12h-Tab (Dextromethorphan-Guaifenesin) .... Take 1 Tablet By Mouth Two Times A Day As Needed 12)  Alprazolam 0.25 Mg Tabs (Alprazolam) .... Take 1 Tablet By Mouth Two Times A Day As Needed 13)  Gabapentin 300 Mg Caps  (Gabapentin) .... One By Mouth At Bedtime As Needed 14)  Triamcinolone Acetonide 0.1 % Crea (Triamcinolone Acetonide) .... Apply Two Times A Day As Directed 15)  Uloric 40 Mg Tabs (Febuxostat) .... One By Mouth Qd 16)  Voltaren 1 % Gel (Diclofenac Sodium) .... Apply 2 Grams Qid 17)  Cefuroxime Axetil 500 Mg Tabs (Cefuroxime Axetil) .... One By Mouth Two Times A Day  Allergies (verified): No Known Drug Allergies  Physical Exam  General:  alert, well-developed, and well-nourished.   Msk:  R knee: Mild swelling.  No gross deformity, bruising otherwise. Mod TTP medial joint line.  No lateral joint line, pes TTP or other TTP about knee. ROM 0-110 degrees. Neg ant/post drawers, Neg lachmanns.   No click but pain medially with mcmurrays and reproduces her pain. NVI distally.   Impression & Recommendations:  Problem # 1:  KNEE PAIN, RIGHT (ICD-719.46) Assessment Deteriorated Patient does have moderate medial DJD.  Would expect injection to have helped her as she does not have end stage DJD.  Her reports of catching and persistent pain despite injection suggest she may have degenerative medial meniscal tear.  Will proceed with MRI to further assess.  We discussed that if one is present, would refer her to ortho to consider arthroscopy.  However, with her at least moderate DJD it is difficult to project how much improvement she would get with partial meniscectomy with her underlying DJD.  Given rx for vicodin (no driving on this) for severe pain.  Will call her next week with MRI result.  Her updated medication list for this problem includes:    Vicodin 5-500 Mg Tabs (Hydrocodone-acetaminophen) .Marland Kitchen... 1 tab by mouth three times a day as needed severe pain  Orders: MRI without Contrast (MRI w/o Contrast)  Complete Medication List: 1)  Prevacid 30 Mg Cpdr (Lansoprazole) .... Take 1 tablet by mouth once a day 2)  Furosemide 40 Mg Tabs (Furosemide) .... Take 1 tablet by mouth once a day as  directed 3)  Synthroid 50 Mcg Tabs (Levothyroxine sodium) .... Take 1 tablet by mouth once a day 4)  Toprol Xl 50 Mg Tb24 (Metoprolol succinate) .... Take 1 tablet by mouth once a day 5)  Cozaar 100 Mg Tabs (Losartan potassium) .... Take 1 tablet by mouth once a day 6)  Astelin 137 Mcg/spray Soln (Azelastine hcl) .... One spray each nostril once daily as needed 7)  Simvastatin 40 Mg Tabs (Simvastatin) .... One by mouth qhs 8)  Calcium Carbonate-vitamin D 600-400 Mg-unit Tabs (Calcium carbonate-vitamin d) .... Take 1 tablet by mouth once a day 9)  Vitamin D 2000 Unit Tabs (Cholecalciferol) .... Take 1 tablet by mouth once a day 10)  Pataday 0.2 % Soln (Olopatadine hcl) .... One drop each eye once daily 11)  Mucinex Dm Maximum Strength 60-1200 Mg Xr12h-tab (Dextromethorphan-guaifenesin) .... Take 1 tablet by mouth two times a  day as needed 12)  Alprazolam 0.25 Mg Tabs (Alprazolam) .... Take 1 tablet by mouth two times a day as needed 13)  Gabapentin 300 Mg Caps (Gabapentin) .... One by mouth at bedtime as needed 14)  Triamcinolone Acetonide 0.1 % Crea (Triamcinolone acetonide) .... Apply two times a day as directed 15)  Uloric 40 Mg Tabs (Febuxostat) .... One by mouth qd 16)  Voltaren 1 % Gel (Diclofenac sodium) .... Apply 2 grams qid 17)  Cefuroxime Axetil 500 Mg Tabs (Cefuroxime axetil) .... One by mouth two times a day 18)  Vicodin 5-500 Mg Tabs (Hydrocodone-acetaminophen) .Marland Kitchen.. 1 tab by mouth three times a day as needed severe pain Prescriptions: VICODIN 5-500 MG TABS (HYDROCODONE-ACETAMINOPHEN) 1 tab by mouth three times a day as needed severe pain  #30 x 0   Entered and Authorized by:   Norton Blizzard MD   Signed by:   Norton Blizzard MD on 10/27/2010   Method used:   Print then Give to Patient   RxID:   [id number]    Orders Added: 1)  MRI without Contrast [MRI w/o Contrast] 2)  Est. Patient Level III [06269]

## 2010-12-18 ENCOUNTER — Encounter: Payer: Self-pay | Admitting: Internal Medicine

## 2010-12-18 ENCOUNTER — Ambulatory Visit: Payer: Medicare Other | Admitting: Physical Therapy

## 2010-12-20 NOTE — Assessment & Plan Note (Signed)
Summary: sinus infection/ss--rm 4   Vital Signs:  Patient profile:   77 year old female Height:      61 inches Weight:      172 pounds BMI:     32.62 O2 Sat:      97 % on Room air Temp:     97.8 degrees F oral Pulse rate:   88 / minute Pulse rhythm:   regular Resp:     18 per minute BP sitting:   118 / 70  (right arm) Cuff size:   large  Vitals Entered By: Mervin Kung CMA (AAMA) (December 13, 2010 3:10 PM)  O2 Flow:  Room air CC: Pt states she has post nasal drip and productive cough with green mucus. Is Patient Diabetic? Yes Pain Assessment Patient in pain? no      Comments Pt needs refill on Astelin to mail order and local rx for Voltaren. Pt completed Levaquin and was taken off Calcium. Christina Mckinney CMA Christina Mckinney)  December 13, 2010 3:20 PM    Primary Care Provider:  Dondra Spry DO  CC:  Pt states she has post nasal drip and productive cough with green mucus..  History of Present Illness: Ms.  Mckinney is a 77 year old female who presents today with chief complaint of sinus drainage.   Symptoms started over 1 month ago, has turned green 1 week ago.  Has associated cough. Denies associated fever. Has tried mucinex without relief.  Dr. Judith Blonder- neurology gave her rx for propranolol 60mg  by mouth two times a day but she did not start because she was scared of mixing with her other blood pressure medications.   Allergies (verified): No Known Drug Allergies  Past History:  Past Medical History: Last updated: 10/19/2010 Current Problems:  HYPERTENSION (ICD-401.9) HYPOTHYROIDISM (ICD-244.9)   HYPERLIPIDEMIA (ICD-272.4)  ? H/O CAD (cath in 2001)     HOT FLASHES (ICD-627.2)  DYSPNEA ON EXERTION (ICD-786.09)  UNSPECIFIED SUBJECTIVE VISUAL DISTURBANCE (ICD-368.10) VARICOSE VEINS LOWER EXTREMITIES W/INFLAMMATION (ICD-454.1) TREMOR, ESSENTIAL (ICD-333.1) RHINOSINUSITIS, ACUTE (ICD-461.8) GOUT (ICD-274.9) OSTEOARTHRITIS, HX OF (ICD-V13.5)  HX, PERSONAL, PEPTIC  ULCER DISEASE (ICD-V12.71) DIVERTICULOSIS, COLON (ICD-562.10) ANXIETY (ICD-300.00) COLONIC POLYPS, HX OF (ICD-V12.72) GERD (ICD-530.81) DIVERTICULITIS, HX OF (ICD-V12.79) DEPRESSION (ICD-311) DIABETES MELLITUS, TYPE II, BORDERLINE (ICD-790.29) RESTLESS LEG SYNDROME (ICD-333.94) hyperplastic colon polyp, 2004; another colon polyp removed 2009 by Dr. in [place] unclear pathology Pulomonary Embolism- 07/2009  Past Surgical History: Last updated: 07/18/2010 Cholecystectomy  Hysterectomy Deviated Septum     History of shoulder surgery          Review of Systems       see HPI  Physical Exam  General:  Well-developed,well-nourished,in no acute distress; alert,appropriate and cooperative throughout examination Head:  + tenderness to palpation of right cheek.  normocephalic.   Eyes:  No corneal or conjunctival inflammation noted. EOMI. Perrla. Funduscopic exam benign, without hemorrhages, exudates or papilledema. Vision grossly normal. Mouth:  Oral mucosa and oropharynx without lesions or exudates.  Teeth in good repair. Neck:  No deformities, masses, or tenderness noted. Lungs:  Normal respiratory effort, chest expands symmetrically. Lungs are clear to auscultation, no crackles or wheezes. Heart:  Normal rate and regular rhythm. S1 and S2 normal without gallop, murmur, click, rub or other extra sounds. Psych:  Noted to have some difficulty remembering names.   Impression & Recommendations:  Problem # 1:  SINUSITIS, ACUTE (ICD-461.9) Assessment New  Will plan to treat with amoxicillin.  Pt advised to call for f/u as  noted in patient sign out.  The following medications were removed from the medication list:    Levaquin 500 Mg Tabs (Levofloxacin) ..... One tablet by mouth daily for 7 days Her updated medication list for this problem includes:    Astelin 137 Mcg/spray Soln (Azelastine hcl) ..... One spray each nostril once daily as needed    Mucinex Dm Maximum Strength 60-1200  Mg Xr12h-tab (Dextromethorphan-guaifenesin) .Marland Kitchen... Take 1 tablet by mouth two times a day as needed    Amoxicillin 500 Mg Caps (Amoxicillin) .Marland Kitchen... 2 caps by mouth three times a day for 10 days  Orders: Prescription Created Electronically [phone]  Complete Medication List: 1)  Prevacid 30 Mg Cpdr (Lansoprazole) .... Take 1 tablet by mouth once a day 2)  Furosemide 40 Mg Tabs (Furosemide) .... Take 1 tablet by mouth once a day as directed 3)  Synthroid 50 Mcg Tabs (Levothyroxine sodium) .... Take 1 tablet by mouth once a day 4)  Toprol Xl 50 Mg Tb24 (Metoprolol succinate) .... Take 1 tablet by mouth once a day 5)  Cozaar 100 Mg Tabs (Losartan potassium) .... Take 1 tablet by mouth once a day 6)  Astelin 137 Mcg/spray Soln (Azelastine hcl) .... One spray each nostril once daily as needed 7)  Simvastatin 40 Mg Tabs (Simvastatin) .... One by mouth qhs 8)  Calcium Carbonate-vitamin D 600-400 Mg-unit Tabs (Calcium carbonate-vitamin d) .... Take 1 tablet by mouth once a day 9)  Vitamin D 2000 Unit Tabs (Cholecalciferol) .... Take 1 tablet by mouth once a day 10)  Pataday 0.2 % Soln (Olopatadine hcl) .... One drop each eye once daily 11)  Mucinex Dm Maximum Strength 60-1200 Mg Xr12h-tab (Dextromethorphan-guaifenesin) .... Take 1 tablet by mouth two times a day as needed 12)  Alprazolam 0.25 Mg Tabs (Alprazolam) .... Take 1 tablet by mouth two times a day as needed 13)  Gabapentin 300 Mg Caps (Gabapentin) .... One by mouth at bedtime as needed 14)  Triamcinolone Acetonide 0.1 % Crea (Triamcinolone acetonide) .... Apply two times a day as directed 15)  Uloric 40 Mg Tabs (Febuxostat) .... One by mouth qd 16)  Voltaren 1 % Gel (Diclofenac sodium) .... Apply 2 grams qid 17)  Vicodin 5-500 Mg Tabs (Hydrocodone-acetaminophen) .Marland Kitchen.. 1 tab by mouth three times a day as needed severe pain 18)  Glucosamine-chondroitin 500-250 Mg Caps (Glucosamine-chondroitin) .... Take 1 capsule by mouth two times a day. 19)   Amoxicillin 500 Mg Caps (Amoxicillin) .... 2 caps by mouth three times a day for 10 days  Patient Instructions: 1)  Call if you develop fever over 101, increasing sinus pressure, pain with eye movement, increased facial tenderness of swelling, or if you develop visual changes. 2)  Call if your symptoms are not improved in 48-72 hours. Prescriptions: AMOXICILLIN 500 MG CAPS (AMOXICILLIN) 2 caps by mouth three times a day for 10 days  #60 x 0   Entered and Authorized by:   Lemont Fillers FNP   Signed by:   Lemont Fillers FNP on 12/13/2010   Method used:   Electronically to        [facility] (retail)       [address]       [place], [place]  [address]       Ph: [phone]       Fax: [phone]   RxID:   [id number] VOLTAREN 1 % GEL (DICLOFENAC SODIUM) apply 2 grams  qid  #2 pk x 0   Entered and Authorized by:   Lemont Fillers FNP   Signed by:   Lemont Fillers FNP on 12/13/2010   Method used:   Electronically to        [facility] (retail)       [address]       [place], [place]  [address]       Ph: [phone]       Fax: [phone]   RxID:   [id number] ASTELIN 137 MCG/SPRAY  SOLN (AZELASTINE HCL) one spray each nostril once daily as needed  #3 x 0   Entered and Authorized by:   Lemont Fillers FNP   Signed by:   Lemont Fillers FNP on 12/13/2010   Method used:   Faxed to ...       [facility])       [address] [address]       [place], [place]  [address]       Ph: [phone]       Fax: [phone]   RxID:   [id number]    Orders Added: 1)  Est. Patient Level III [50093] 2)  Prescription Created Electronically [phone]    Current Allergies (reviewed today): No known allergies

## 2010-12-21 ENCOUNTER — Encounter: Payer: Self-pay | Admitting: Physical Therapy

## 2010-12-27 ENCOUNTER — Encounter: Payer: Self-pay | Admitting: Internal Medicine

## 2010-12-28 NOTE — Miscellaneous (Signed)
Summary: PT Initial Summary/[facility]  PT Initial [facility]   Imported By: Lanelle Bal 12/19/2010 14:14:13  _____________________________________________________________________  External Attachment:    Type:   Image     Comment:   External Document

## 2011-01-09 NOTE — Miscellaneous (Signed)
Summary: Discharge Summary for PT/[facility]  Discharge Summary for PT/[facility]   Imported By: Maryln Gottron 01/04/2011 12:24:55  _____________________________________________________________________  External Attachment:    Type:   Image     Comment:   External Document

## 2011-01-18 NOTE — Letter (Signed)
Summary: [facility]  [facility]   Imported By: Maryln Gottron 01/10/2011 15:57:58  _____________________________________________________________________  External Attachment:    Type:   Image     Comment:   External Document

## 2011-02-16 LAB — BASIC METABOLIC PANEL WITH GFR
BUN: 20 mg/dL (ref 6–23)
CO2: 28 meq/L (ref 19–32)
Calcium: 8.9 mg/dL (ref 8.4–10.5)
Chloride: 93 meq/L — ABNORMAL LOW (ref 96–112)
Creatinine, Ser: 1.35 mg/dL — ABNORMAL HIGH (ref 0.4–1.2)
GFR calc non Af Amer: 38 mL/min — ABNORMAL LOW
Glucose, Bld: 112 mg/dL — ABNORMAL HIGH (ref 70–99)
Potassium: 3.9 meq/L (ref 3.5–5.1)
Sodium: 131 meq/L — ABNORMAL LOW (ref 135–145)

## 2011-02-16 LAB — ANTIPHOSPHOLIPID SYNDROME EVAL, BLD
Anticardiolipin IgG: <10 GPL U/mL — ABNORMAL LOW (ref <10)
Anticardiolipin IgM: <10 MPL U/mL — ABNORMAL LOW (ref <10)
Antiphosphatidylserine IgA: <20 APS U/mL (ref <20.0)
PTTLA 4:1 Mix: 61.1 secs — ABNORMAL HIGH (ref 36.3–48.8)
PTTLA Confirmation: 17.5 secs — ABNORMAL HIGH (ref <8.0)

## 2011-02-16 LAB — DIFFERENTIAL
Eosinophils Relative: 0 % (ref 0–5)
Lymphocytes Relative: 10 % — ABNORMAL LOW (ref 12–46)
Monocytes Absolute: 1 10*3/uL (ref 0.1–1.0)
Monocytes Relative: 8 % (ref 3–12)
Neutro Abs: 10.3 10*3/uL — ABNORMAL HIGH (ref 1.7–7.7)

## 2011-02-16 LAB — CBC
HCT: 32 % — ABNORMAL LOW (ref 36.0–46.0)
HCT: 32.2 % — ABNORMAL LOW (ref 36.0–46.0)
HCT: 32.4 % — ABNORMAL LOW (ref 36.0–46.0)
HCT: 34.4 % — ABNORMAL LOW (ref 36.0–46.0)
Hemoglobin: 11.1 g/dL — ABNORMAL LOW (ref 12.0–15.0)
Hemoglobin: 11.2 g/dL — ABNORMAL LOW (ref 12.0–15.0)
Hemoglobin: 11.2 g/dL — ABNORMAL LOW (ref 12.0–15.0)
Hemoglobin: 12.1 g/dL (ref 12.0–15.0)
MCHC: 34.3 g/dL (ref 30.0–36.0)
MCHC: 35.3 g/dL (ref 30.0–36.0)
MCV: 96.7 fL (ref 78.0–100.0)
MCV: 96.9 fL (ref 78.0–100.0)
Platelets: 189 10*3/uL (ref 150–400)
Platelets: 239 10*3/uL (ref 150–400)
RBC: 3.33 MIL/uL — ABNORMAL LOW (ref 3.87–5.11)
RBC: 3.34 MIL/uL — ABNORMAL LOW (ref 3.87–5.11)
RBC: 3.38 MIL/uL — ABNORMAL LOW (ref 3.87–5.11)
RBC: 3.36 MIL/uL — ABNORMAL LOW (ref 3.87–5.11)
RBC: 3.62 MIL/uL — ABNORMAL LOW (ref 3.87–5.11)
RDW: 13.1 % (ref 11.5–15.5)
WBC: 10 10*3/uL (ref 4.0–10.5)
WBC: 7.4 10*3/uL (ref 4.0–10.5)
WBC: 8.4 10*3/uL (ref 4.0–10.5)

## 2011-02-16 LAB — FACTOR 5 LEIDEN

## 2011-02-16 LAB — COMPREHENSIVE METABOLIC PANEL
ALT: 23 U/L (ref 0–35)
AST: 54 U/L — ABNORMAL HIGH (ref 0–37)
Albumin: 3.5 g/dL (ref 3.5–5.2)
Alkaline Phosphatase: 95 U/L (ref 39–117)
Chloride: 102 mEq/L (ref 96–112)
Creatinine, Ser: 1.03 mg/dL (ref 0.4–1.2)
GFR calc Af Amer: >60 mL/min (ref >60)
Potassium: 4.2 mEq/L (ref 3.5–5.1)
Sodium: 139 mEq/L (ref 135–145)
Total Bilirubin: 1.1 mg/dL (ref 0.3–1.2)

## 2011-02-16 LAB — BASIC METABOLIC PANEL
CO2: 32 mEq/L (ref 19–32)
Calcium: 9.9 mg/dL (ref 8.4–10.5)
GFR calc Af Amer: >60 mL/min (ref >60)
GFR calc non Af Amer: 54 mL/min — ABNORMAL LOW (ref >60)
Potassium: 4.5 mEq/L (ref 3.5–5.1)
Sodium: 139 mEq/L (ref 135–145)

## 2011-02-16 LAB — PROTIME-INR
INR: 1.3 (ref 0.00–1.49)
INR: 1.7 — ABNORMAL HIGH (ref 0.00–1.49)
Prothrombin Time: 15.9 seconds — ABNORMAL HIGH (ref 11.6–15.2)
Prothrombin Time: 14.5 seconds (ref 11.6–15.2)
Prothrombin Time: 15.3 seconds — ABNORMAL HIGH (ref 11.6–15.2)

## 2011-02-16 LAB — PROTEIN C, TOTAL: Protein C, Total: 90 % (ref 70–140)

## 2011-02-16 LAB — LIPID PANEL
Cholesterol: 128 mg/dL (ref 0–200)
LDL Cholesterol: 52 mg/dL (ref 0–99)
Total CHOL/HDL Ratio: 2.2 RATIO

## 2011-02-16 LAB — CARDIAC PANEL(CRET KIN+CKTOT+MB+TROPI): Relative Index: 2.3 (ref 0.0–2.5)

## 2011-02-16 LAB — PROTEIN S, TOTAL: Protein S Ag, Total: 119 % (ref 70–140)

## 2011-03-27 NOTE — Assessment & Plan Note (Signed)
OFFICE VISIT   Christina Mckinney, Christina Mckinney  DOB:  1932/05/03                                       03/09/2009  [id number]#:[id number]   The patient presents today for evaluation of venous pathology and lower  extremity discomfort.  She is a very pleasant 77 year old female who  reports discomfort in both calves.  She reports that this is worse at  night while in bed.  She has been treated for restless leg syndrome in  the past with not much response.  She does not have any history of DVT  or varicose veins.  She did have a hematoma in her left thigh after a  fall from a ladder in 2000 but no venous pathology.  She does not have  any history of arterial insufficiency.   PAST MEDICAL HISTORY:  Significant for depression, diverticulitis,  gastroesophageal reflux disease, hyperlipidemia, hypertension,  hypothyroidism, anxiety and non-insulin-dependent diabetes.   SOCIAL HISTORY:  She does not smoke having quit in 1990 and has several  alcohol drinks per week.   ALLERGIES:  She has no known drug allergies.   MEDICATIONS:  Her med list is attached to her chart.   PHYSICAL EXAMINATION:  A well-developed, well-nourished white female  appearing her stated age of 42.  Blood pressure is 132/72, pulse 76,  respirations 18.  Her radial pulses are 2+ bilaterally.  She has a 2+  right posterior tibial and 2+ left dorsalis pedis pulse.  She does not  have any evidence of venous varicosities nor any swelling.  She does  have scattered spider vein telangiectasia bilaterally.   She underwent a screening venous duplex by myself and this showed normal  caliber saphenous vein bilaterally with no evidence of reflux.  I  discussed this with the patient.  I explained that I do not see any  etiology either arterial or venous to explain her discomfort.  I  explained that I would not recommend any further specific evaluation of  this since she does not appear to have any arterial or venous  pathology.  She is concerned regarding the appearance of her spider vein  telangiectasia.  I did discuss the option of sclerotherapy for  improvement of this.  She is quite interested in pursuing this.  I  discussed the procedure and expected out of pocket expense and she  wishes to proceed with this.  She will be in contact with Clementeen Hoof, RN  who will be performing the sclerotherapy.   Larina Earthly, M.D.  Electronically Signed   TFE/MEDQ  D:  03/09/2009  T:  03/10/2009  Job:  [id number]   cc:   Barbette Hair. Artist Pais, DO

## 2011-03-27 NOTE — Procedures (Signed)
NAME:  Christina Mckinney, Christina Mckinney NO.:  [id number]   MEDICAL RECORD NO.:  [id number]          PATIENT TYPE:  OUT   LOCATION:  SLEEP CENTER                 FACILITY:  [facility]   PHYSICIAN:  Oretha Milch, MD      DATE OF BIRTH:  Jul 29, 1932   DATE OF STUDY:  07/12/2009                            NOCTURNAL POLYSOMNOGRAM   REFERRING PHYSICIAN:   INDICATION FOR THE STUDY:  Excessive daytime fatigue, restless sleep,  nonrefreshing sleep, frequent napping in this 77 year old woman.  At the  time of the study, she weighed 175 pounds with a height of 5 feet 1  inch, BMI of 33, neck size is 15 inches, Epworth sleepiness score 9.   This overnight polysomnogram was performed with a sleep technologist in  attendance.  EEG, EOG, EMG, EKG and respiratory parameters were  recorded.  Sleep stages arousals, limb movement, and respiratory data  were scored according to criteria laid out by the American Academy of  Sleep Medicine.   SLEEP ARCHITECTURE:  Lights out was at 9:51 p.m., lights on was at 5:04  a.m.  Total sleep time was 349 minutes with a sleep period of 400  minutes and a sleep maintenance efficiency of 92%.  Sleep latency was 33  minutes.  REM sleep was not noted.  Sleep stages of the percentage of  total sleep time was N1 6.4%, N2 89%, and N3 5%.  Supine sleep accounted  for 57.5 minutes.   AROUSAL DATA:  There were total of 367 arousals with an arousal index of  63 events per hour, of these 222 were spontaneous, 134 were associated  with limb movements, and the rest were associated with respiratory  events.   RESPIRATORY DATA:  There were total of 20 obstructive apneas, 2 central  apneas, 0 mixed apneas and 1 hypopnea with an apnea/hypopnea index of  3.9 events per hour, 72 respiratory effort-related arousals were noted  with an RDI of 16.3 events per hour.  Longest apnea was 24 seconds and  longest hypopnea was 18 seconds.   LIMB MOVEMENT DATA:  The periodic limb movement  index was 70 events per  hour.  The PLM arousal index was 22 events per hour.   OXYGEN SATURATION DATA:  The desaturation index was 1.2 events per hour.  The lowest desaturation was 92%.   CARDIAC DATA:  No arrhythmias were noted.  The low heart rate was 31  beats per minute and the high heart rate was 111 beats per minute.   DISCUSSION:  Severely fragmented sleep due to arousals, mostly  spontaneous and associated with limb movements.  Large number of  respiratory effort-related arousals without oxygen desaturation.   IMPRESSION:  1. The main issue here seems to be significant periodic limb movement      causing arousals.  2. Mild sleep disordered breathing in the form off respiratory effort-      related arousals.  I did not believe that this is causing her      symptoms.  No intervention was performed.  3. No evidence of cardiac arrhythmias or behavioral disturbance during      sleep.  4. Lack of REM sleep, may be effect of medications.   RECOMMENDATIONS:  1. Treatment of periodic limb movements during sleep would include      checking iron levels and use of dopamine agonist such as ropinirole      or pramipexole, note that she is already on Klonopin.  2. The degree of sleep disorder breathing is not sufficient to warrant      treatment at this time.  3. Weight loss can be encouraged.      Oretha Milch, MD  Electronically Signed     RVA/MEDQ  D:  07/15/2009 10:40:21  T:  07/16/2009 05:00:04  Job:  [id number]   cc:   Barbette Hair. [place], DO  [address] [place], [place] [address]

## 2011-03-27 NOTE — Assessment & Plan Note (Signed)
OFFICE VISIT   FALLEN, CRISOSTOMO  DOB:  08/10/1932                                       03/09/2009  [id number]#:[id number]   Patient presents today regarding concern for discomfort in her lower  extremity.  DICTATION ENDS HERE.   Larina Earthly, M.D.   TFE/MEDQ  D:  03/09/2009  T:  03/10/2009  Job:  [id number]   cc:   Barbette Hair. Artist Pais, DO

## 2011-04-04 ENCOUNTER — Ambulatory Visit (INDEPENDENT_AMBULATORY_CARE_PROVIDER_SITE_OTHER): Payer: Medicare Other | Admitting: Gastroenterology

## 2011-04-04 ENCOUNTER — Encounter: Payer: Self-pay | Admitting: Gastroenterology

## 2011-04-04 VITALS — BP 132/72 | HR 68 | Ht 65.0 in | Wt 174.0 lb

## 2011-04-04 DIAGNOSIS — Z86010 Personal history of colonic polyps: Secondary | ICD-10-CM

## 2011-04-04 DIAGNOSIS — K219 Gastro-esophageal reflux disease without esophagitis: Secondary | ICD-10-CM

## 2011-04-04 MED ORDER — PEG-KCL-NACL-NASULF-NA ASC-C 100 G PO SOLR: 1.0000 | ORAL | Status: DC

## 2011-04-04 NOTE — Patient Instructions (Signed)
Please start taking citrucel (orange flavored) powder fiber supplement.  This may cause some bloating at first but that usually goes away. Begin with a small spoonful and work your way up to a large, heaping spoonful daily over a week. You will be set up for a colonoscopy for tubulovillous adenoma removed in 2009 by Dr. Jacqulyn Bath in [facility]. You will be set up for an upper endoscopy. A copy of this information will be made available to Dr. Artist Pais.

## 2011-04-04 NOTE — Progress Notes (Signed)
Review of pertinent gastrointestinal problems: 1. GERD: EGD 2010 by Dr. Christella Hartigan found hiatal hernia, mild esophagitis, gastritis which was H. pylori negative on biopsies 2. history of tubular adenomas: Colonoscopy in [place] 2009 I believe that this procedure was incomplete due to tortuous colon, an 8 mm polyp was removed biopsies showed this was a tubulovillous adenoma. Usual interval of surveillance would be 3 years.Marland Kitchen     HPI: This is a 77 year old woman.  Who has had months of pyrosis.  Symptoms are related to certain foods.  She takes prilosec in AM, sometimes before she eats but not usually.  She has a lot of gas problems.  She can have fecal incontinence.  She is absolutely convinced that she has something wrong her upper GI tract. I tried to explain to her that she had an EGD performed by me about 2 years ago that showed minor inflammation and a hiatal hernia only.   I recommended that we simply change the way she is taking her proton pump inhibitor but she insists on having another upper endoscopy.      Physical Exam: BP 132/72  Pulse 68  Ht 5\' 5"  (1.651 m)  Wt 174 lb (78.926 kg)  BMI 28.96 kg/m2 Constitutional: generally well-appearing Psychiatric: alert and oriented x3 Abdomen: soft, nontender, nondistended, no obvious ascites, no peritoneal signs, normal bowel sounds    Assessment and plan: 77 y.o. female with Villous adenoma 2009, chronic GERD  We will proceed with colonoscopy at her cyst convenience and she is insisting on upper endoscopy at the same time. I tried to explain to her that I think it is necessary however she insists.

## 2011-04-10 ENCOUNTER — Encounter: Payer: Medicare Other | Admitting: Gastroenterology

## 2011-04-13 ENCOUNTER — Ambulatory Visit (AMBULATORY_SURGERY_CENTER): Payer: Medicare Other | Admitting: Gastroenterology

## 2011-04-13 ENCOUNTER — Encounter: Payer: Self-pay | Admitting: Gastroenterology

## 2011-04-13 DIAGNOSIS — R1013 Epigastric pain: Secondary | ICD-10-CM

## 2011-04-13 DIAGNOSIS — K219 Gastro-esophageal reflux disease without esophagitis: Secondary | ICD-10-CM

## 2011-04-13 DIAGNOSIS — R198 Other specified symptoms and signs involving the digestive system and abdomen: Secondary | ICD-10-CM

## 2011-04-13 DIAGNOSIS — K3189 Other diseases of stomach and duodenum: Secondary | ICD-10-CM

## 2011-04-13 DIAGNOSIS — Z86010 Personal history of colonic polyps: Secondary | ICD-10-CM

## 2011-04-13 DIAGNOSIS — K5730 Diverticulosis of large intestine without perforation or abscess without bleeding: Secondary | ICD-10-CM

## 2011-04-13 DIAGNOSIS — K2970 Gastritis, unspecified, without bleeding: Secondary | ICD-10-CM

## 2011-04-13 DIAGNOSIS — K2940 Chronic atrophic gastritis without bleeding: Secondary | ICD-10-CM

## 2011-04-13 MED ORDER — DISPOSABLE ENEMA 19-7 GM/118ML RE ENEM: 2.0000 | ENEMA | Freq: Once | RECTAL | Status: DC

## 2011-04-13 MED ORDER — SODIUM CHLORIDE 0.9 % IV SOLN: 500.0000 mL | INTRAVENOUS | Status: DC

## 2011-04-13 NOTE — Patient Instructions (Signed)
See  Blue and green discharge instruction sheets.

## 2011-04-13 NOTE — Progress Notes (Signed)
Patient stating she has a strong history of constipation, followed all directions, still with stool presnt. Informed Dr. Christella Hartigan, 2 fleets enemas given.Resulted with yellow liquid no solid stool present all liquid.

## 2011-04-16 ENCOUNTER — Telehealth: Payer: Self-pay | Admitting: *Deleted

## 2011-04-16 NOTE — Telephone Encounter (Signed)

## 2011-04-17 ENCOUNTER — Other Ambulatory Visit: Payer: Self-pay

## 2011-04-17 MED ORDER — LANSOPRAZOLE 30 MG PO CPDR: 30.0000 mg | DELAYED_RELEASE_CAPSULE | Freq: Two times a day (BID) | ORAL | Status: DC

## 2011-04-17 NOTE — Telephone Encounter (Signed)
Pt aware that med has been sent. 

## 2011-04-18 NOTE — Telephone Encounter (Signed)
rx was faxed to the pharmacy. 

## 2011-05-09 ENCOUNTER — Other Ambulatory Visit: Payer: Self-pay | Admitting: *Deleted

## 2011-05-09 ENCOUNTER — Telehealth: Payer: Self-pay | Admitting: Internal Medicine

## 2011-05-09 MED ORDER — ALPRAZOLAM 0.25 MG PO TABS: 0.2500 mg | ORAL_TABLET | Freq: Two times a day (BID) | ORAL | Status: DC | PRN

## 2011-05-09 NOTE — Telephone Encounter (Signed)
Patient call requesting a rx refill for  Xanax , she stated that she has an increase in anxiety and would like to know if her dose could be increase. Patient was informed that Dr Artist Pais was not in the office and it has been 6 months since she was last seen and her request may require an office visit. She has verbalized understanding and agrees. She has asked that I check with the covering provider for refills.

## 2011-05-09 NOTE — Telephone Encounter (Signed)
So I would be willing to cover a refill at same dosing but not an increase in dosing unless she is seen to document her vitals and what stressors have increased. If she wants a refill with same amount then OK to refill with same sig, same quantity without additional refills

## 2011-05-09 NOTE — Telephone Encounter (Signed)
Rx called to pharmacy for current prescribed 0.25 bid NR. Call place to patient at [phone], she was informed rx called to pharmacy

## 2011-05-09 NOTE — Telephone Encounter (Signed)
Pt states that she is out of xanax and would like refillsent to [facility] on [address]

## 2011-05-09 NOTE — Telephone Encounter (Signed)
Patient informed rx sent to pharmacy

## 2011-05-31 ENCOUNTER — Encounter: Payer: Self-pay | Admitting: Internal Medicine

## 2011-06-01 ENCOUNTER — Ambulatory Visit (INDEPENDENT_AMBULATORY_CARE_PROVIDER_SITE_OTHER): Payer: Medicare Other | Admitting: Internal Medicine

## 2011-06-01 ENCOUNTER — Encounter: Payer: Self-pay | Admitting: Internal Medicine

## 2011-06-01 DIAGNOSIS — R7309 Other abnormal glucose: Secondary | ICD-10-CM

## 2011-06-01 DIAGNOSIS — E785 Hyperlipidemia, unspecified: Secondary | ICD-10-CM

## 2011-06-01 DIAGNOSIS — E039 Hypothyroidism, unspecified: Secondary | ICD-10-CM

## 2011-06-01 DIAGNOSIS — E119 Type 2 diabetes mellitus without complications: Secondary | ICD-10-CM

## 2011-06-01 DIAGNOSIS — F3289 Other specified depressive episodes: Secondary | ICD-10-CM

## 2011-06-01 DIAGNOSIS — F329 Major depressive disorder, single episode, unspecified: Secondary | ICD-10-CM

## 2011-06-01 LAB — CBC WITH DIFFERENTIAL/PLATELET
Basophils Absolute: 0 10*3/uL (ref 0.0–0.1)
Basophils Relative: 0 % (ref 0–1)
HCT: 38.2 % (ref 36.0–46.0)
Lymphocytes Relative: 24 % (ref 12–46)
MCHC: 33 g/dL (ref 30.0–36.0)
Monocytes Absolute: 0.5 10*3/uL (ref 0.1–1.0)
Neutro Abs: 5.1 10*3/uL (ref 1.7–7.7)
Neutrophils Relative %: 67 % (ref 43–77)
RDW: 14.2 % (ref 11.5–15.5)
WBC: 7.5 10*3/uL (ref 4.0–10.5)

## 2011-06-01 LAB — LIPID PANEL
HDL: 47 mg/dL (ref >39)
LDL Cholesterol: 112 mg/dL — ABNORMAL HIGH (ref 0–99)
Total CHOL/HDL Ratio: 4.4 Ratio
Triglycerides: 244 mg/dL — ABNORMAL HIGH (ref <150)

## 2011-06-01 LAB — BASIC METABOLIC PANEL
BUN: 17 mg/dL (ref 6–23)
Calcium: 10.1 mg/dL (ref 8.4–10.5)
Glucose, Bld: 104 mg/dL — ABNORMAL HIGH (ref 70–99)
Potassium: 4.6 mEq/L (ref 3.5–5.3)
Sodium: 140 mEq/L (ref 135–145)

## 2011-06-01 LAB — HEPATIC FUNCTION PANEL
AST: 23 U/L (ref 0–37)
Albumin: 4.5 g/dL (ref 3.5–5.2)
Total Bilirubin: 0.6 mg/dL (ref 0.3–1.2)
Total Protein: 7.4 g/dL (ref 6.0–8.3)

## 2011-06-01 LAB — TSH: TSH: 2.205 u[IU]/mL (ref 0.350–4.500)

## 2011-06-01 MED ORDER — CITALOPRAM HYDROBROMIDE 20 MG PO TABS: 20.0000 mg | ORAL_TABLET | Freq: Every day | ORAL | Status: DC

## 2011-06-02 LAB — HEMOGLOBIN A1C: Hgb A1c MFr Bld: 6.1 % — ABNORMAL HIGH (ref <5.7)

## 2011-06-02 NOTE — Assessment & Plan Note (Signed)
suboptimal control with possible contribution to insomnia. Begin Celexa 20 mg daily. Followup in 4 weeks or sooner if necessary

## 2011-06-02 NOTE — Progress Notes (Signed)
  Subjective:    Patient ID: CYNTHYA YAM, female    DOB: 1932/07/17, 77 y.o.   MRN: [id number]  HPI Patient presents to clinic for evaluation of multiple medical problems. Next insomnia and corresponding depressed mood. No SI. Continues to be bothered by her daughters passing. History of hyperlipidemia tolerating statin therapy without myalgias or abnormal LFTs. History of hypothyroidism on stable dose of Synthroid. Takes Xanax p.r.n. For anxiety which appears to be stable. Also has history of diabetes felt to be mild and not currently requiring medication. No other complaint.  Reviewed past medical history, medications and allergies  Review of Systems see history of present illness     Objective:   Physical Exam    Physical Exam  Vitals reviewed. Constitutional:  appears well-developed and well-nourished. No distress.  HENT:  Head: Normocephalic and atraumatic.  Nose: Nose normal.  Mouth/Throat: Oropharynx is clear and moist. No oropharyngeal exudate.  Eyes: Conjunctivae and EOM are normal. Pupils are equal, round, and reactive to light. Right eye exhibits no discharge. Left eye exhibits no discharge. No scleral icterus.  Neck: Neck supple. No thyromegaly present.  No carotid bruits Cardiovascular: Normal rate, regular rhythm and normal heart sounds.  Exam reveals no gallop and no friction rub.   No murmur heard. Pulmonary/Chest: Effort normal and breath sounds normal. No respiratory distress.  has no wheezes.  has no rales.  Lymphadenopathy:   no cervical adenopathy.  Neurological:  is alert.  Skin: Skin is warm and dry.  not diaphoretic.  Psychiatric: normal mood and affect.      Assessment & Plan:

## 2011-06-02 NOTE — Assessment & Plan Note (Signed)
Mild. Obtain CBC, Chem-7, A1c.

## 2011-06-02 NOTE — Assessment & Plan Note (Signed)
Stable. Obtain TSH

## 2011-06-02 NOTE — Assessment & Plan Note (Signed)
Stable. Obtain fasting lipid profile and liver function test

## 2011-06-04 ENCOUNTER — Ambulatory Visit: Payer: Medicare Other | Admitting: Gastroenterology

## 2011-06-14 ENCOUNTER — Telehealth: Payer: Self-pay | Admitting: Internal Medicine

## 2011-06-14 MED ORDER — LANSOPRAZOLE 30 MG PO CPDR: 30.0000 mg | DELAYED_RELEASE_CAPSULE | Freq: Two times a day (BID) | ORAL | Status: DC

## 2011-06-14 NOTE — Telephone Encounter (Signed)
[facility] fax [phone].  New prescription: lansoprazole dr 30mg  capsule. 90 day supply. 4 refills

## 2011-06-14 NOTE — Telephone Encounter (Signed)
Patient also called in stating that she is out of lansoprazole and would like a 30 day supply sent to [facility] on [address]

## 2011-06-14 NOTE — Telephone Encounter (Signed)
Pt states she did not get supply from authorization in June that went to [facility]. Refill sent to [facility] per 04/17/11 authorization of Dr Christella Hartigan. #180 x 3 refills. 30 day supply also sent to [facility]. Left message on machine that rxs were completed and to call if any questions.

## 2011-06-20 ENCOUNTER — Telehealth: Payer: Self-pay | Admitting: *Deleted

## 2011-06-20 NOTE — Telephone Encounter (Signed)
Pt called stating she was contacted by [facility] and told she needs refills on all her medications. Pt is not able to look at her bottles and tell which medications she needs. Pt also states she was prescribed Inderal by her specialist for tremors but has been afraid to start the medication as she is on 2 other blood pressure medications. Advised pt to contact prescribing provider and discuss her concerns. Notified pt that I will contact [facility] re: refills.

## 2011-06-21 MED ORDER — SIMVASTATIN 40 MG PO TABS: 40.0000 mg | ORAL_TABLET | Freq: Every day | ORAL | Status: DC

## 2011-06-21 MED ORDER — METOPROLOL SUCCINATE ER 50 MG PO TB24: 50.0000 mg | ORAL_TABLET | Freq: Every day | ORAL | Status: DC

## 2011-06-21 MED ORDER — FUROSEMIDE 40 MG PO TABS: 40.0000 mg | ORAL_TABLET | Freq: Every day | ORAL | Status: DC

## 2011-06-21 MED ORDER — LOSARTAN POTASSIUM 100 MG PO TABS: 100.0000 mg | ORAL_TABLET | Freq: Every day | ORAL | Status: DC

## 2011-06-21 NOTE — Telephone Encounter (Signed)
Spoke with Shanda Bumps at [facility] and verified that pt needs new rxs for: Furosemide, Metoprolol, Simvastatin and Losartan. Refills sent to [facility] for 90 day supply and 1 refill. Left detailed message re: refill status and to call if any questions.

## 2011-07-06 ENCOUNTER — Ambulatory Visit (INDEPENDENT_AMBULATORY_CARE_PROVIDER_SITE_OTHER): Payer: Medicare Other | Admitting: Internal Medicine

## 2011-07-06 ENCOUNTER — Ambulatory Visit: Payer: Medicare Other | Admitting: Internal Medicine

## 2011-07-06 DIAGNOSIS — F329 Major depressive disorder, single episode, unspecified: Secondary | ICD-10-CM

## 2011-07-06 DIAGNOSIS — F5104 Psychophysiologic insomnia: Secondary | ICD-10-CM

## 2011-07-06 DIAGNOSIS — G252 Other specified forms of tremor: Secondary | ICD-10-CM

## 2011-07-06 DIAGNOSIS — G250 Essential tremor: Secondary | ICD-10-CM

## 2011-07-06 DIAGNOSIS — G4700 Insomnia, unspecified: Secondary | ICD-10-CM

## 2011-07-06 DIAGNOSIS — M1990 Unspecified osteoarthritis, unspecified site: Secondary | ICD-10-CM

## 2011-07-06 MED ORDER — CLONAZEPAM 0.5 MG PO TABS: 0.5000 mg | ORAL_TABLET | Freq: Two times a day (BID) | ORAL | Status: DC | PRN

## 2011-07-06 MED ORDER — HYDROCODONE-ACETAMINOPHEN 5-500 MG PO TABS: 1.0000 | ORAL_TABLET | Freq: Three times a day (TID) | ORAL | Status: AC | PRN

## 2011-07-06 NOTE — Assessment & Plan Note (Signed)
She has been using alprazolam on occasion for insomnia.  She reports it is not effective.   We already reviewed sleep hygiene in the past.   Trial of clonazepam.  Stop gabapentin as taking both meds may increase risk of excessive sedation.

## 2011-07-06 NOTE — Assessment & Plan Note (Signed)
Pt has OA at multiple sites.  She wanted to try tramadol but I advised against this due to potential interaction with citalopram.  Continue vicodin as needed.  Pt advised to use NSAIDs sparingly.  Lab Results  Component Value Date   CREATININE 1.12* 06/01/2011

## 2011-07-06 NOTE — Assessment & Plan Note (Signed)
Improved on citalopram.  Continue for 6 months to 1 yr.  When she is ready to stop medication, we discussed need for slow taper.

## 2011-07-06 NOTE — Progress Notes (Signed)
Subjective:    Patient ID: Christina Mckinney, female    DOB: 06-05-1932, 77 y.o.   MRN: [id number]  HPI  77 y/o with multiple complaints.  Pt struggling with chronic insomnia and hot flashed.  She has been using .25 of alprazolam for sleep but it is not effective.    She also complains of chronic benign tremor.  She was seen by neurologist in [facility] who prescribed inderal but she has not started.  She is already on metoprolol and she was started on citalopram by Dr. Rodena Medin for depressive symptoms.  Depressive symptoms better since starting citalopram.  She denies adverse effects.     Review of Systems Chronic osteoarthritis symptoms.  She take ibuprofen or vicodin as needed for severe symptoms    Past Medical History  Diagnosis Date  . Depressive disorder, not elsewhere classified   . Diverticulitis   . GERD (gastroesophageal reflux disease)   . Other and unspecified hyperlipidemia   . Unspecified essential hypertension   . Other abnormal glucose   . Unspecified hypothyroidism   . Personal history of colonic polyps   . Anxiety state, unspecified   . Personal history of peptic ulcer disease   . Osteoarthritis   . Gout, unspecified   . Essential and other specified forms of tremor   . Internal hemorrhoids without mention of complication   . Anemia, unspecified   . Unspecified disorder of bladder   . Pulmonary embolism     History   Social History  . Marital Status: Widowed    Spouse Name: N/A    Number of Children: 3  . Years of Education: N/A   Occupational History  . Retired     Social History Main Topics  . Smoking status: Former Games developer  . Smokeless tobacco: Never Used  . Alcohol Use: Yes     occasional wine   . Drug Use: No  . Sexually Active: Not on file   Other Topics Concern  . Not on file   Social History Narrative  . No narrative on file    Past Surgical History  Procedure Date  . Cholecystectomy   . Abdominal hysterectomy   . Nasal septum surgery     . Shoulder surgery   . Knee arthroscopy     right     Family History  Problem Relation Age of Onset  . Colon cancer Neg Hx     Not on File  Current Outpatient Prescriptions on File Prior to Visit  Medication Sig Dispense Refill  . azelastine (ASTELIN) 137 MCG/SPRAY nasal spray 1 spray by Nasal route daily as needed. Use in each nostril as directed       . Cholecalciferol (VITAMIN D) 2000 UNITS tablet Take 2,000 Units by mouth daily.        . citalopram (CELEXA) 20 MG tablet Take 1 tablet (20 mg total) by mouth daily.  90 tablet  6  . co-enzyme Q-10 30 MG capsule Take 30 mg by mouth 3 (three) times daily.        Marland Kitchen Dextromethorphan-Guaifenesin (MUCINEX DM MAXIMUM STRENGTH) 60-1200 MG per 12 hr tablet Take 1 tablet by mouth every 12 (twelve) hours as needed.        . diclofenac sodium (VOLTAREN) 1 % GEL Apply 2 grams qid.       . febuxostat (ULORIC) 40 MG tablet Take 40 mg by mouth daily.        . furosemide (LASIX) 40 MG tablet Take 1 tablet (40  mg total) by mouth daily. As directed.  90 tablet  1  . Glucosamine-Chondroitin 500-250 MG CAPS Take 1 capsule by mouth 2 (two) times daily.        . lansoprazole (PREVACID) 30 MG capsule Take 1 capsule (30 mg total) by mouth 2 (two) times daily.  60 capsule  0  . levothyroxine (SYNTHROID, LEVOTHROID) 50 MCG tablet Take 50 mcg by mouth daily.        Marland Kitchen losartan (COZAAR) 100 MG tablet Take 1 tablet (100 mg total) by mouth daily.  90 tablet  1  . metoprolol (TOPROL-XL) 50 MG 24 hr tablet Take 1 tablet (50 mg total) by mouth daily.  90 tablet  1  . Olopatadine HCl (PATADAY) 0.2 % SOLN Apply 1 drop to eye daily. Each eye.       . simvastatin (ZOCOR) 40 MG tablet Take 1 tablet (40 mg total) by mouth at bedtime.  90 tablet  1   Current Facility-Administered Medications on File Prior to Visit  Medication Dose Route Frequency Provider Last Rate Last Dose  . 0.9 %  sodium chloride infusion  500 mL Intravenous Continuous Rob Bunting, MD        BP  120/74  Temp(Src) 98.2 F (36.8 C) (Oral)  Wt 170 lb (77.111 kg)    Objective:   Physical Exam   Constitutional: Appears well-developed and well-nourished. No distress.  Head: Normocephalic and atraumatic.  Eyes: Conjunctivae are normal. Pupils are equal, round, and reactive to light.  Neck: Normal range of motion. Neck supple. No thyromegaly present. No carotid bruit Cardiovascular: Normal rate, regular rhythm and normal heart sounds.  Exam reveals no gallop and no friction rub.   No murmur heard. Pulmonary/Chest: Effort normal and breath sounds normal.  No wheezes. No rales.  Neurological: Alert. No cranial nerve deficit.  Skin: Skin is warm and dry.  Psychiatric: Normal mood and affect. Behavior is normal.       Assessment & Plan:

## 2011-07-06 NOTE — Assessment & Plan Note (Signed)
Pt advised not to start inderal considering depressive symptoms.  Reassess at next OV.

## 2011-09-10 ENCOUNTER — Ambulatory Visit (INDEPENDENT_AMBULATORY_CARE_PROVIDER_SITE_OTHER): Payer: Medicare Other | Admitting: Internal Medicine

## 2011-09-10 ENCOUNTER — Encounter: Payer: Self-pay | Admitting: Internal Medicine

## 2011-09-10 VITALS — BP 124/60 | HR 76 | Temp 98.3°F

## 2011-09-10 DIAGNOSIS — J329 Chronic sinusitis, unspecified: Secondary | ICD-10-CM | POA: Insufficient documentation

## 2011-09-10 DIAGNOSIS — I10 Essential (primary) hypertension: Secondary | ICD-10-CM

## 2011-09-10 DIAGNOSIS — F329 Major depressive disorder, single episode, unspecified: Secondary | ICD-10-CM

## 2011-09-10 MED ORDER — CITALOPRAM HYDROBROMIDE 20 MG PO TABS: 20.0000 mg | ORAL_TABLET | Freq: Every day | ORAL | Status: DC

## 2011-09-10 MED ORDER — HYDROCODONE-ACETAMINOPHEN 5-500 MG PO TABS: 1.0000 | ORAL_TABLET | Freq: Two times a day (BID) | ORAL | Status: AC | PRN

## 2011-09-10 MED ORDER — LEVOTHYROXINE SODIUM 50 MCG PO TABS: 50.0000 ug | ORAL_TABLET | Freq: Every day | ORAL | Status: DC

## 2011-09-10 MED ORDER — CLONAZEPAM 0.5 MG PO TABS: 0.5000 mg | ORAL_TABLET | Freq: Two times a day (BID) | ORAL | Status: DC | PRN

## 2011-09-10 NOTE — Assessment & Plan Note (Signed)
Improved.   Better sleep quality with clonazepam.  We discussed using clonazepam as needed.  We discussed habit forming nature of BZDs.

## 2011-09-10 NOTE — Progress Notes (Signed)
Subjective:    Patient ID: Christina Mckinney, female    DOB: [birth year], 77 y.o.   MRN: [id number]  HPI 77 y/o female for depression follow up.   Since adding clonazepam pt notes depression is improved. She is sleeping much better.  Her leg pains at night also improved.     She complains of chronic post nasal gtt.  Her phlegm is yellow and green.   She has noticed this over last 1 month.  She denies fevers or chills.  She notes some fatigue.  Mild facial pain.  Htn - stable  Review of Systems    She complains intermittent shoulder pain.  She has had shoulder replacement.  Past Medical History  Diagnosis Date  . Depressive disorder, not elsewhere classified   . Diverticulitis   . GERD (gastroesophageal reflux disease)   . Other and unspecified hyperlipidemia   . Unspecified essential hypertension   . Other abnormal glucose   . Unspecified hypothyroidism   . Personal history of colonic polyps   . Anxiety state, unspecified   . Personal history of peptic ulcer disease   . Osteoarthritis   . Gout, unspecified   . Essential and other specified forms of tremor   . Internal hemorrhoids without mention of complication   . Anemia, unspecified   . Unspecified disorder of bladder   . Pulmonary embolism     History   Social History  . Marital Status: Widowed    Spouse Name: N/A    Number of Children: 3  . Years of Education: N/A   Occupational History  . Retired     Social History Main Topics  . Smoking status: Former Games developer  . Smokeless tobacco: Never Used  . Alcohol Use: Yes     occasional wine   . Drug Use: No  . Sexually Active: Not on file   Other Topics Concern  . Not on file   Social History Narrative  . No narrative on file    Past Surgical History  Procedure Date  . Cholecystectomy   . Abdominal hysterectomy   . Nasal septum surgery   . Shoulder surgery   . Knee arthroscopy     right     Family History  Problem Relation Age of Onset  . Colon cancer  Neg Hx     Not on File  Current Outpatient Prescriptions on File Prior to Visit  Medication Sig Dispense Refill  . azelastine (ASTELIN) 137 MCG/SPRAY nasal spray 1 spray by Nasal route daily as needed. Use in each nostril as directed       . Cholecalciferol (VITAMIN D) 2000 UNITS tablet Take 2,000 Units by mouth daily.        Marland Kitchen co-enzyme Q-10 30 MG capsule Take 30 mg by mouth 3 (three) times daily.        Marland Kitchen Dextromethorphan-Guaifenesin (MUCINEX DM MAXIMUM STRENGTH) 60-1200 MG per 12 hr tablet Take 1 tablet by mouth every 12 (twelve) hours as needed.        . diclofenac sodium (VOLTAREN) 1 % GEL Apply 2 grams qid.       . febuxostat (ULORIC) 40 MG tablet Take 40 mg by mouth daily.        . furosemide (LASIX) 40 MG tablet Take 1 tablet (40 mg total) by mouth daily. As directed.  90 tablet  1  . Glucosamine-Chondroitin 500-250 MG CAPS Take 1 capsule by mouth 2 (two) times daily.        . lansoprazole (  PREVACID) 30 MG capsule Take 1 capsule (30 mg total) by mouth 2 (two) times daily.  60 capsule  0  . losartan (COZAAR) 100 MG tablet Take 1 tablet (100 mg total) by mouth daily.  90 tablet  1  . metoprolol (TOPROL-XL) 50 MG 24 hr tablet Take 1 tablet (50 mg total) by mouth daily.  90 tablet  1  . Olopatadine HCl (PATADAY) 0.2 % SOLN Apply 1 drop to eye daily. Each eye.       . simvastatin (ZOCOR) 40 MG tablet Take 1 tablet (40 mg total) by mouth at bedtime.  90 tablet  1  . DISCONTD: levothyroxine (SYNTHROID, LEVOTHROID) 50 MCG tablet Take 50 mcg by mouth daily.         Current Facility-Administered Medications on File Prior to Visit  Medication Dose Route Frequency Provider Last Rate Last Dose  . DISCONTD: 0.9 %  sodium chloride infusion  500 mL Intravenous Continuous Rob Bunting, MD        BP 124/60  Pulse 76  Temp(Src) 98.3 F (36.8 C) (Oral)    Objective:   Physical Exam   Constitutional: Appears well-developed and well-nourished. No distress.  Ear:  TMs are  normal Mouth/Throat: Oropharynx is clear and moist.  Eyes: Conjunctivae are normal. Pupils are equal, round, and reactive to light.  Neck: Normal range of motion. Neck supple. No thyromegaly present. No carotid bruit Cardiovascular: Normal rate, regular rhythm and normal heart sounds.  Exam reveals no gallop and no friction rub.   No murmur heard. Pulmonary/Chest: Effort normal and breath sounds normal.  No wheezes. No rales.  Abdominal: Soft. Bowel sounds are normal. No mass. There is no tenderness.  Neurological: Alert. No cranial nerve deficit.  Skin: Skin is warm and dry.  Psychiatric: Normal mood and affect. Behavior is normal.        Assessment & Plan:

## 2011-09-10 NOTE — Assessment & Plan Note (Signed)
Pt complains of chronic purulent nasal drainage x 1 month.  Obtain CT of Sinuses.  We will decide on abx and length of tx based upon CT results.

## 2011-09-10 NOTE — Assessment & Plan Note (Signed)
Stable.  Continue current medication regimen.  BP: 124/60 mmHg

## 2011-09-14 ENCOUNTER — Ambulatory Visit (INDEPENDENT_AMBULATORY_CARE_PROVIDER_SITE_OTHER)
Admission: RE | Admit: 2011-09-14 | Discharge: 2011-09-14 | Disposition: A | Discharge status: Home / Self Care | Payer: Medicare Other | Source: Ambulatory Visit | Attending: Internal Medicine | Admitting: Internal Medicine

## 2011-09-14 DIAGNOSIS — J329 Chronic sinusitis, unspecified: Secondary | ICD-10-CM

## 2011-10-12 ENCOUNTER — Encounter: Payer: Self-pay | Admitting: Internal Medicine

## 2011-10-12 ENCOUNTER — Ambulatory Visit (INDEPENDENT_AMBULATORY_CARE_PROVIDER_SITE_OTHER): Payer: Medicare Other | Admitting: Internal Medicine

## 2011-10-12 VITALS — BP 132/66 | Temp 97.9°F | Wt 178.0 lb

## 2011-10-12 DIAGNOSIS — G252 Other specified forms of tremor: Secondary | ICD-10-CM

## 2011-10-12 DIAGNOSIS — F5104 Psychophysiologic insomnia: Secondary | ICD-10-CM

## 2011-10-12 DIAGNOSIS — R300 Dysuria: Secondary | ICD-10-CM

## 2011-10-12 DIAGNOSIS — G250 Essential tremor: Secondary | ICD-10-CM

## 2011-10-12 DIAGNOSIS — R05 Cough: Secondary | ICD-10-CM

## 2011-10-12 DIAGNOSIS — J449 Chronic obstructive pulmonary disease, unspecified: Secondary | ICD-10-CM | POA: Insufficient documentation

## 2011-10-12 DIAGNOSIS — N390 Urinary tract infection, site not specified: Secondary | ICD-10-CM | POA: Insufficient documentation

## 2011-10-12 DIAGNOSIS — G4700 Insomnia, unspecified: Secondary | ICD-10-CM

## 2011-10-12 LAB — POCT URINALYSIS DIPSTICK
Bilirubin, UA: NEGATIVE
Blood, UA: NEGATIVE
Ketones, UA: NEGATIVE
Protein, UA: NEGATIVE
Spec Grav, UA: 1.015
pH, UA: 5.5

## 2011-10-12 MED ORDER — GABAPENTIN 300 MG PO CAPS: 300.0000 mg | ORAL_CAPSULE | Freq: Every day | ORAL | Status: DC

## 2011-10-12 MED ORDER — CEFUROXIME AXETIL 500 MG PO TABS: 500.0000 mg | ORAL_TABLET | Freq: Two times a day (BID) | ORAL | Status: AC

## 2011-10-12 MED ORDER — CITALOPRAM HYDROBROMIDE 20 MG PO TABS: 20.0000 mg | ORAL_TABLET | Freq: Every day | ORAL | Status: DC

## 2011-10-12 NOTE — Patient Instructions (Signed)
If are feeling drowsy during the day, do not take mucinex DM.  Use only plain mucinex for cough. Stop metoprolol when you start propranolol.

## 2011-10-12 NOTE — Assessment & Plan Note (Signed)
Cefuroxime should also cover UTI.  Send urine for culture.

## 2011-10-12 NOTE — Assessment & Plan Note (Signed)
Improved with clonazepam but patient is experiencing some daytime somnolence. Reduce dose to .25 mg at bedtime. Her somnolence may also be due to use of Mucinex DM.

## 2011-10-12 NOTE — Assessment & Plan Note (Signed)
Patient experiencing persistent symptoms.  She was previously seen by neurologist about this. She would like to switch beta blockers from metoprolol to propranolol.  We discussed possibility of worsening pulmonary symptoms with nonselective beta blocker.  She will try switching medication after current episode of bronchitis resolved.

## 2011-10-12 NOTE — Progress Notes (Signed)
Subjective:    Patient ID: Christina Mckinney, female    DOB: [birth year], 77 y.o.   MRN: [id number]  Cough This is a chronic problem. The current episode started more than 1 month ago. The problem has been unchanged. The problem occurs every few hours. The cough is productive of purulent sputum. Associated symptoms include a sore throat. Pertinent negatives include no chest pain, chills, fever, hemoptysis or shortness of breath. Risk factors for lung disease include smoking/tobacco exposure.   Her CT of her sinuses were negative for chronic sinus disease.   Review of Systems  Constitutional: Negative for fever and chills.  HENT: Positive for sore throat.   Respiratory: Positive for cough. Negative for hemoptysis and shortness of breath.   Cardiovascular: Negative for chest pain.      She feels she is sleeping too much Pt also complains her tremors are getting worse Occasional wheeze at home She also complains of strong odor to her urine  Past Medical History  Diagnosis Date  . Depressive disorder, not elsewhere classified   . Diverticulitis   . GERD (gastroesophageal reflux disease)   . Other and unspecified hyperlipidemia   . Unspecified essential hypertension   . Other abnormal glucose   . Unspecified hypothyroidism   . Personal history of colonic polyps   . Anxiety state, unspecified   . Personal history of peptic ulcer disease   . Osteoarthritis   . Gout, unspecified   . Essential and other specified forms of tremor   . Internal hemorrhoids without mention of complication   . Anemia, unspecified   . Unspecified disorder of bladder   . Pulmonary embolism     History   Social History  . Marital Status: Widowed    Spouse Name: N/A    Number of Children: 3  . Years of Education: N/A   Occupational History  . Retired     Social History Main Topics  . Smoking status: Former Games developer  . Smokeless tobacco: Never Used  . Alcohol Use: Yes     occasional wine   . Drug  Use: No  . Sexually Active: Not on file   Other Topics Concern  . Not on file   Social History Narrative  . No narrative on file    Past Surgical History  Procedure Date  . Cholecystectomy   . Abdominal hysterectomy   . Nasal septum surgery   . Shoulder surgery   . Knee arthroscopy     right     Family History  Problem Relation Age of Onset  . Colon cancer Neg Hx     Not on File  Current Outpatient Prescriptions on File Prior to Visit  Medication Sig Dispense Refill  . azelastine (ASTELIN) 137 MCG/SPRAY nasal spray 1 spray by Nasal route daily as needed. Use in each nostril as directed       . Cholecalciferol (VITAMIN D) 2000 UNITS tablet Take 2,000 Units by mouth daily.        . citalopram (CELEXA) 20 MG tablet Take 1 tablet (20 mg total) by mouth daily.  90 tablet  1  . clonazePAM (KLONOPIN) 0.5 MG tablet Take 1 tablet (0.5 mg total) by mouth 2 (two) times daily as needed.  30 tablet  3  . co-enzyme Q-10 30 MG capsule Take 30 mg by mouth 3 (three) times daily.        Marland Kitchen Dextromethorphan-Guaifenesin (MUCINEX DM MAXIMUM STRENGTH) 60-1200 MG per 12 hr tablet Take 1 tablet by mouth  every 12 (twelve) hours as needed.        . diclofenac sodium (VOLTAREN) 1 % GEL Apply 2 grams qid.       . febuxostat (ULORIC) 40 MG tablet Take 40 mg by mouth daily.        . furosemide (LASIX) 40 MG tablet Take 1 tablet (40 mg total) by mouth daily. As directed.  90 tablet  1  . Glucosamine-Chondroitin 500-250 MG CAPS Take 1 capsule by mouth 2 (two) times daily.        . lansoprazole (PREVACID) 30 MG capsule Take 1 capsule (30 mg total) by mouth 2 (two) times daily.  60 capsule  0  . levothyroxine (SYNTHROID, LEVOTHROID) 50 MCG tablet Take 1 tablet (50 mcg total) by mouth daily.  90 tablet  1  . losartan (COZAAR) 100 MG tablet Take 1 tablet (100 mg total) by mouth daily.  90 tablet  1  . metoprolol (TOPROL-XL) 50 MG 24 hr tablet Take 1 tablet (50 mg total) by mouth daily.  90 tablet  1  .  Olopatadine HCl (PATADAY) 0.2 % SOLN Apply 1 drop to eye daily. Each eye.       . simvastatin (ZOCOR) 40 MG tablet Take 1 tablet (40 mg total) by mouth at bedtime.  90 tablet  1    BP 132/66  Temp(Src) 97.9 F (36.6 C) (Oral)  Wt 178 lb (80.74 kg)    Objective:   Physical Exam  Constitutional: She appears well-developed and well-nourished.  HENT:  Head: Normocephalic and atraumatic.  Right Ear: External ear normal.  Left Ear: External ear normal.  Neck: Normal range of motion. Neck supple.  Cardiovascular: Normal rate, regular rhythm and normal heart sounds.   Pulmonary/Chest: Effort normal. She has no wheezes.       Mild coarse breath sounds left mid lung field  Skin: Skin is warm and dry.  Psychiatric: She has a normal mood and affect. Her behavior is normal.       Assessment & Plan:

## 2011-10-12 NOTE — Assessment & Plan Note (Signed)
77 year old female with history of tobacco use complains of chronic cough. Her CT of her sinuses were negative for chronic sinus disease. Her symptoms may be due to chronic bronchitis. Treat with Ceftin 500 mg twice a day x10 days. If persistent symptoms pursue spirometry and inhaled corticosteroids.

## 2011-10-14 LAB — URINE CULTURE

## 2011-11-09 ENCOUNTER — Ambulatory Visit (INDEPENDENT_AMBULATORY_CARE_PROVIDER_SITE_OTHER): Payer: Medicare Other | Admitting: Internal Medicine

## 2011-11-09 ENCOUNTER — Encounter: Payer: Self-pay | Admitting: Internal Medicine

## 2011-11-09 VITALS — BP 132/74 | HR 72

## 2011-11-09 DIAGNOSIS — R05 Cough: Secondary | ICD-10-CM

## 2011-11-09 DIAGNOSIS — G250 Essential tremor: Secondary | ICD-10-CM

## 2011-11-09 DIAGNOSIS — M25569 Pain in unspecified knee: Secondary | ICD-10-CM

## 2011-11-09 DIAGNOSIS — M25561 Pain in right knee: Secondary | ICD-10-CM

## 2011-11-09 MED ORDER — PROPRANOLOL HCL 60 MG PO TABS: 60.0000 mg | ORAL_TABLET | Freq: Two times a day (BID) | ORAL | Status: DC

## 2011-11-09 MED ORDER — LEVOFLOXACIN 500 MG PO TABS: 500.0000 mg | ORAL_TABLET | Freq: Every day | ORAL | Status: AC

## 2011-11-09 MED ORDER — DARIFENACIN HYDROBROMIDE ER 15 MG PO TB24: 15.0000 mg | ORAL_TABLET | Freq: Every day | ORAL | Status: DC

## 2011-11-09 NOTE — Assessment & Plan Note (Signed)
The patient still having refractory cough with purulent sputum despite treatment with Ceftin. Treat with Levaquin 500 mg once daily x10 days.Marland Kitchen

## 2011-11-09 NOTE — Progress Notes (Signed)
Subjective:    Patient ID: Christina Mckinney, female    DOB: 04-30-1932, 77 y.o.   MRN: [id number]  HPI 77 year old white female for followup. Patient previously seen for chronic productive cough. She was prescribed Ceftin 500 mg twice daily. Despite taking antibiotics patient has persistent symptoms.  She is still coughing up discolored phlegm.  She denies fever or shortness of breath.  She also complains of right knee pain. She twisted her knee while getting out of a chair approximately 10 days ago. She has previous history of right knee injury in the past which required arthroscopy with debridement. Right knee pain worse with ambulation. She senses a clicking sensation.  Benign tremor - improved since starting propranolol.  Review of Systems Negative for joint redness,  negative for fever  Past Medical History  Diagnosis Date  . Depressive disorder, not elsewhere classified   . Diverticulitis   . GERD (gastroesophageal reflux disease)   . Other and unspecified hyperlipidemia   . Unspecified essential hypertension   . Other abnormal glucose   . Unspecified hypothyroidism   . Personal history of colonic polyps   . Anxiety state, unspecified   . Personal history of peptic ulcer disease   . Osteoarthritis   . Gout, unspecified   . Essential and other specified forms of tremor   . Internal hemorrhoids without mention of complication   . Anemia, unspecified   . Unspecified disorder of bladder   . Pulmonary embolism     History   Social History  . Marital Status: Widowed    Spouse Name: N/A    Number of Children: 3  . Years of Education: N/A   Occupational History  . Retired     Social History Main Topics  . Smoking status: Former Games developer  . Smokeless tobacco: Never Used  . Alcohol Use: Yes     occasional wine   . Drug Use: No  . Sexually Active: Not on file   Other Topics Concern  . Not on file   Social History Narrative  . No narrative on file    Past Surgical  History  Procedure Date  . Cholecystectomy   . Abdominal hysterectomy   . Nasal septum surgery   . Shoulder surgery   . Knee arthroscopy     right     Family History  Problem Relation Age of Onset  . Colon cancer Neg Hx     Not on File  Current Outpatient Prescriptions on File Prior to Visit  Medication Sig Dispense Refill  . azelastine (ASTELIN) 137 MCG/SPRAY nasal spray 1 spray by Nasal route daily as needed. Use in each nostril as directed       . Cholecalciferol (VITAMIN D) 2000 UNITS tablet Take 2,000 Units by mouth daily.        . citalopram (CELEXA) 20 MG tablet Take 1 tablet (20 mg total) by mouth daily.  90 tablet  1  . clonazePAM (KLONOPIN) 0.5 MG tablet Take 0.5 tablets (0.25 mg total) by mouth 2 (two) times daily as needed.  30 tablet  3  . co-enzyme Q-10 30 MG capsule Take 30 mg by mouth 3 (three) times daily.        . cyclobenzaprine (FLEXERIL) 10 MG tablet Take 10 mg by mouth 3 (three) times daily as needed.        . diclofenac sodium (VOLTAREN) 1 % GEL Apply 2 grams qid.       . febuxostat (ULORIC) 40 MG tablet Take  40 mg by mouth daily.        . furosemide (LASIX) 40 MG tablet Take 1 tablet (40 mg total) by mouth daily. As directed.  90 tablet  1  . gabapentin (NEURONTIN) 300 MG capsule Take 1 capsule (300 mg total) by mouth daily.  90 capsule  1  . Glucosamine-Chondroitin 500-250 MG CAPS Take 1 capsule by mouth 2 (two) times daily.        Marland Kitchen HYDROcodone-acetaminophen (VICODIN) 5-500 MG per tablet Take 1 tablet by mouth every 6 (six) hours as needed.        . lansoprazole (PREVACID) 30 MG capsule Take 1 capsule (30 mg total) by mouth 2 (two) times daily.  60 capsule  0  . levothyroxine (SYNTHROID, LEVOTHROID) 50 MCG tablet Take 1 tablet (50 mcg total) by mouth daily.  90 tablet  1  . losartan (COZAAR) 100 MG tablet Take 1 tablet (100 mg total) by mouth daily.  90 tablet  1  . Olopatadine HCl (PATADAY) 0.2 % SOLN Apply 1 drop to eye daily. Each eye.       .  simvastatin (ZOCOR) 40 MG tablet Take 1 tablet (40 mg total) by mouth at bedtime.  90 tablet  1    BP 132/74  Pulse 72      Objective:   Physical Exam  Constitutional: She is oriented to person, place, and time. She appears well-developed and well-nourished.  HENT:  Head: Normocephalic and atraumatic.  Right Ear: External ear normal.  Left Ear: External ear normal.  Cardiovascular: Normal rate, regular rhythm and normal heart sounds.   Pulmonary/Chest: Effort normal and breath sounds normal. No respiratory distress. She has no wheezes.  Musculoskeletal:       Possible medial collateral ligament laxity, clicking noise with knee extension  Neurological: She is alert and oriented to person, place, and time.  Skin: Skin is warm and dry.  Psychiatric: She has a normal mood and affect. Her behavior is normal.          Assessment & Plan:

## 2011-11-09 NOTE — Assessment & Plan Note (Signed)
Improved with propranolol 

## 2011-11-09 NOTE — Assessment & Plan Note (Signed)
77 year old female twisted her right knee 10 days ago. I suspect ligamentous injury.  She spoke with her orthopedist who suggested MRI of knee.  She already arranged follow up with her orthopedist at [facility] - Dr. Elliot Dally.

## 2011-11-09 NOTE — Patient Instructions (Signed)
Hold citalopram while you are taking levaquin (antibiotic)

## 2011-11-13 LAB — HM DEXA SCAN: HM Dexa Scan: NORMAL

## 2011-11-15 ENCOUNTER — Ambulatory Visit
Admission: RE | Admit: 2011-11-15 | Discharge: 2011-11-15 | Disposition: A | Discharge status: Home / Self Care | Payer: Medicare Other | Source: Ambulatory Visit | Attending: Internal Medicine | Admitting: Internal Medicine

## 2011-11-15 DIAGNOSIS — M659 Synovitis and tenosynovitis, unspecified: Secondary | ICD-10-CM | POA: Diagnosis not present

## 2011-11-15 DIAGNOSIS — M25569 Pain in unspecified knee: Secondary | ICD-10-CM | POA: Diagnosis not present

## 2011-11-15 DIAGNOSIS — M25561 Pain in right knee: Secondary | ICD-10-CM

## 2011-11-15 DIAGNOSIS — S83419A Sprain of medial collateral ligament of unspecified knee, initial encounter: Secondary | ICD-10-CM | POA: Diagnosis not present

## 2011-11-19 DIAGNOSIS — Z961 Presence of intraocular lens: Secondary | ICD-10-CM | POA: Diagnosis not present

## 2011-11-20 ENCOUNTER — Telehealth: Payer: Self-pay | Admitting: *Deleted

## 2011-11-20 NOTE — Telephone Encounter (Signed)
Gave pt MRI results, pt is wanting something for knee pain called in to [facility] [address].  And she would also like a refill on her clonazepam called into [facility].  Please advise

## 2011-11-20 NOTE — Telephone Encounter (Signed)
Pt is calling for MRI results of knee.

## 2011-11-21 MED ORDER — CLONAZEPAM 0.5 MG PO TABS: 0.2500 mg | ORAL_TABLET | Freq: Two times a day (BID) | ORAL | Status: DC | PRN

## 2011-11-21 NOTE — Telephone Encounter (Signed)
Hydrocodone called in [facility], clonazepam faxed to [facility]

## 2011-11-21 NOTE — Telephone Encounter (Signed)
Pt. Notified.

## 2011-11-21 NOTE — Telephone Encounter (Signed)
Ok for rx for clonazepam to [facility].  3 month supply with 1 refill.  Please call in vicodin 5/500 generic  # 30 one po bid prn knee pain.  No refills

## 2011-11-28 DIAGNOSIS — IMO0002 Reserved for concepts with insufficient information to code with codable children: Secondary | ICD-10-CM | POA: Diagnosis not present

## 2011-12-11 ENCOUNTER — Telehealth: Payer: Self-pay | Admitting: Internal Medicine

## 2011-12-11 NOTE — Telephone Encounter (Signed)
[facility] new [facility]. Thanks.

## 2011-12-12 MED ORDER — CLONAZEPAM 0.5 MG PO TABS: 0.2500 mg | ORAL_TABLET | Freq: Two times a day (BID) | ORAL | Status: DC | PRN

## 2011-12-12 NOTE — Telephone Encounter (Signed)
We gave her a written rx for 90 tabs at last OV.  Why is she requesting new rx

## 2011-12-12 NOTE — Telephone Encounter (Signed)
Pt states that [facility] did not receive rx and she does not have it.  Ok per Dr Artist Pais to call [facility] to [facility].  #90 with no refills, rx called in, pt aware

## 2011-12-31 ENCOUNTER — Encounter: Payer: Self-pay | Admitting: Internal Medicine

## 2011-12-31 ENCOUNTER — Ambulatory Visit (INDEPENDENT_AMBULATORY_CARE_PROVIDER_SITE_OTHER): Payer: Medicare Other | Admitting: Internal Medicine

## 2011-12-31 VITALS — BP 108/74 | HR 67 | Temp 98.1°F | Wt 171.0 lb

## 2011-12-31 DIAGNOSIS — I10 Essential (primary) hypertension: Secondary | ICD-10-CM | POA: Diagnosis not present

## 2011-12-31 DIAGNOSIS — G252 Other specified forms of tremor: Secondary | ICD-10-CM

## 2011-12-31 DIAGNOSIS — G250 Essential tremor: Secondary | ICD-10-CM

## 2011-12-31 DIAGNOSIS — R05 Cough: Secondary | ICD-10-CM

## 2011-12-31 MED ORDER — FLUTICASONE PROPIONATE 50 MCG/ACT NA SUSP: 2.0000 | Freq: Every day | NASAL | Status: DC

## 2011-12-31 NOTE — Assessment & Plan Note (Signed)
Chronic persistent cough in former smoker. Question chronic bronchitis. Obtain CT of chest without contrast.  If negative for signs of chronic infection we discussed performing spirometry at next office visit.  Trial of nasal steroids for now.

## 2011-12-31 NOTE — Patient Instructions (Addendum)
Please complete the following lab tests before your next follow up appointment: BMET - 401.9 A1c - 790.29 TSH - 244.9 Urinalysis - 791.9

## 2011-12-31 NOTE — Assessment & Plan Note (Signed)
Improved with switch to propanolol.

## 2011-12-31 NOTE — Progress Notes (Signed)
Subjective:    Patient ID: Christina Mckinney, female    DOB: [birth year], 78 y.o.   MRN: [id number]  HPI  78 year old white female for followup. Patient previously seen for chronic cough. She was treated with levoquin 500 mg once daily x10 days. Patient reports her cough improved for one month then symptoms recurred within the last several weeks. Her cough is productive of thick discolored sputum. She denies any sinus congestion. Her previous CT of sinuses in 09/10/2011 is negative for chronic sinus disease.  Chronic knee pain-she was seen by her orthopedic physician at [facility]. She received cortisone injection and is planning on series of hyaluronic acid injections.  Review of Systems Negative for fever,  Negative for shortness of breath  Past Medical History  Diagnosis Date  . Depressive disorder, not elsewhere classified   . Diverticulitis   . GERD (gastroesophageal reflux disease)   . Other and unspecified hyperlipidemia   . Unspecified essential hypertension   . Other abnormal glucose   . Unspecified hypothyroidism   . Personal history of colonic polyps   . Anxiety state, unspecified   . Personal history of peptic ulcer disease   . Osteoarthritis   . Gout, unspecified   . Essential and other specified forms of tremor   . Internal hemorrhoids without mention of complication   . Anemia, unspecified   . Unspecified disorder of bladder   . Pulmonary embolism     History   Social History  . Marital Status: Widowed    Spouse Name: N/A    Number of Children: 3  . Years of Education: N/A   Occupational History  . Retired     Social History Main Topics  . Smoking status: Former Games developer  . Smokeless tobacco: Never Used  . Alcohol Use: Yes     occasional wine   . Drug Use: No  . Sexually Active: Not on file   Other Topics Concern  . Not on file   Social History Narrative  . No narrative on file    Past Surgical History  Procedure Date  . Cholecystectomy   .  Abdominal hysterectomy   . Nasal septum surgery   . Shoulder surgery   . Knee arthroscopy     right     Family History  Problem Relation Age of Onset  . Colon cancer Neg Hx     Not on File  Current Outpatient Prescriptions on File Prior to Visit  Medication Sig Dispense Refill  . azelastine (ASTELIN) 137 MCG/SPRAY nasal spray 1 spray by Nasal route daily as needed. Use in each nostril as directed       . Cholecalciferol (VITAMIN D) 2000 UNITS tablet Take 2,000 Units by mouth daily.        . citalopram (CELEXA) 20 MG tablet Take 1 tablet (20 mg total) by mouth daily.  90 tablet  1  . clonazePAM (KLONOPIN) 0.5 MG tablet Take 0.5 tablets (0.25 mg total) by mouth 2 (two) times daily as needed.  90 tablet  0  . co-enzyme Q-10 30 MG capsule Take 30 mg by mouth 3 (three) times daily.        . cyclobenzaprine (FLEXERIL) 10 MG tablet Take 10 mg by mouth 3 (three) times daily as needed.        . darifenacin (ENABLEX) 15 MG 24 hr tablet Take 1 tablet (15 mg total) by mouth daily.  90 tablet  1  . diclofenac sodium (VOLTAREN) 1 % GEL Apply 2  grams qid.       . febuxostat (ULORIC) 40 MG tablet Take 40 mg by mouth daily.        . furosemide (LASIX) 40 MG tablet Take 1 tablet (40 mg total) by mouth daily. As directed.  90 tablet  1  . gabapentin (NEURONTIN) 300 MG capsule Take 1 capsule (300 mg total) by mouth daily.  90 capsule  1  . Glucosamine-Chondroitin 500-250 MG CAPS Take 1 capsule by mouth 2 (two) times daily.        Marland Kitchen HYDROcodone-acetaminophen (VICODIN) 5-500 MG per tablet Take 1 tablet by mouth every 6 (six) hours as needed.        . lansoprazole (PREVACID) 30 MG capsule Take 1 capsule (30 mg total) by mouth 2 (two) times daily.  60 capsule  0  . levothyroxine (SYNTHROID, LEVOTHROID) 50 MCG tablet Take 1 tablet (50 mcg total) by mouth daily.  90 tablet  1  . losartan (COZAAR) 100 MG tablet Take 1 tablet (100 mg total) by mouth daily.  90 tablet  1  . Olopatadine HCl (PATADAY) 0.2 % SOLN  Apply 1 drop to eye daily. Each eye.       . propranolol (INDERAL) 60 MG tablet Take 1 tablet (60 mg total) by mouth 2 (two) times daily.  60 tablet  3  . simvastatin (ZOCOR) 40 MG tablet Take 1 tablet (40 mg total) by mouth at bedtime.  90 tablet  1    BP 108/74  Pulse 67  Temp(Src) 98.1 F (36.7 C) (Oral)  Wt 171 lb (77.565 kg)  SpO2 98%     Objective:   Physical Exam  Constitutional: She appears well-developed and well-nourished.  HENT:  Right Ear: External ear normal.  Left Ear: External ear normal.  Mouth/Throat: Oropharynx is clear and moist.  Neck: Neck supple.  Cardiovascular: Normal rate, regular rhythm and normal heart sounds.   Pulmonary/Chest: Effort normal and breath sounds normal. She has no wheezes. She has no rales.  Musculoskeletal: She exhibits no edema.  Lymphadenopathy:    She has no cervical adenopathy.  Skin: Skin is warm and dry.      Assessment & Plan:

## 2011-12-31 NOTE — Assessment & Plan Note (Signed)
Well controlled.  Monitor BMET before next OV. BP: 108/74 mmHg  Lab Results  Component Value Date   CREATININE 1.12* 06/01/2011

## 2012-01-03 ENCOUNTER — Other Ambulatory Visit: Payer: Medicare Other

## 2012-01-04 ENCOUNTER — Ambulatory Visit (INDEPENDENT_AMBULATORY_CARE_PROVIDER_SITE_OTHER)
Admission: RE | Admit: 2012-01-04 | Discharge: 2012-01-04 | Disposition: A | Discharge status: Home / Self Care | Payer: Medicare Other | Source: Ambulatory Visit | Attending: Internal Medicine | Admitting: Internal Medicine

## 2012-01-04 DIAGNOSIS — R0602 Shortness of breath: Secondary | ICD-10-CM | POA: Diagnosis not present

## 2012-01-04 DIAGNOSIS — R05 Cough: Secondary | ICD-10-CM | POA: Diagnosis not present

## 2012-01-04 DIAGNOSIS — J438 Other emphysema: Secondary | ICD-10-CM | POA: Diagnosis not present

## 2012-01-07 ENCOUNTER — Other Ambulatory Visit: Payer: Self-pay | Admitting: Internal Medicine

## 2012-01-08 ENCOUNTER — Other Ambulatory Visit: Payer: Self-pay | Admitting: Internal Medicine

## 2012-01-09 DIAGNOSIS — M1710 Unilateral primary osteoarthritis, unspecified knee: Secondary | ICD-10-CM | POA: Diagnosis not present

## 2012-01-10 ENCOUNTER — Other Ambulatory Visit: Payer: Self-pay | Admitting: Internal Medicine

## 2012-01-14 ENCOUNTER — Ambulatory Visit (INDEPENDENT_AMBULATORY_CARE_PROVIDER_SITE_OTHER): Payer: Medicare Other | Admitting: Internal Medicine

## 2012-01-14 ENCOUNTER — Other Ambulatory Visit (INDEPENDENT_AMBULATORY_CARE_PROVIDER_SITE_OTHER): Payer: Medicare Other

## 2012-01-14 ENCOUNTER — Other Ambulatory Visit: Payer: Self-pay | Admitting: Internal Medicine

## 2012-01-14 DIAGNOSIS — I10 Essential (primary) hypertension: Secondary | ICD-10-CM

## 2012-01-14 DIAGNOSIS — R7309 Other abnormal glucose: Secondary | ICD-10-CM

## 2012-01-14 DIAGNOSIS — E785 Hyperlipidemia, unspecified: Secondary | ICD-10-CM | POA: Diagnosis not present

## 2012-01-14 DIAGNOSIS — J438 Other emphysema: Secondary | ICD-10-CM

## 2012-01-14 DIAGNOSIS — D649 Anemia, unspecified: Secondary | ICD-10-CM

## 2012-01-14 LAB — BASIC METABOLIC PANEL
BUN: 21 mg/dL (ref 6–23)
Calcium: 10.1 mg/dL (ref 8.4–10.5)
Creatinine, Ser: 1.2 mg/dL (ref 0.4–1.2)
GFR: 47.08 mL/min — ABNORMAL LOW (ref >60.00)
Glucose, Bld: 116 mg/dL — ABNORMAL HIGH (ref 70–99)
Potassium: 4 mEq/L (ref 3.5–5.1)

## 2012-01-14 LAB — URINALYSIS
Hgb urine dipstick: NEGATIVE
Total Protein, Urine: NEGATIVE
Urine Glucose: NEGATIVE
Urobilinogen, UA: 0.2 (ref 0.0–1.0)

## 2012-01-14 LAB — PULMONARY FUNCTION TEST

## 2012-01-14 LAB — TSH: TSH: 1.59 u[IU]/mL (ref 0.35–5.50)

## 2012-01-14 NOTE — Progress Notes (Signed)
PFT done today. 

## 2012-01-17 DIAGNOSIS — M25819 Other specified joint disorders, unspecified shoulder: Secondary | ICD-10-CM | POA: Diagnosis not present

## 2012-01-17 DIAGNOSIS — IMO0002 Reserved for concepts with insufficient information to code with codable children: Secondary | ICD-10-CM | POA: Diagnosis not present

## 2012-01-18 ENCOUNTER — Other Ambulatory Visit: Payer: Self-pay | Admitting: Internal Medicine

## 2012-01-18 NOTE — Telephone Encounter (Signed)
Pt need refill on clonazepam 0.5mg  call into [facility] [address]

## 2012-01-21 ENCOUNTER — Other Ambulatory Visit: Payer: Medicare Other

## 2012-01-21 ENCOUNTER — Encounter: Payer: Self-pay | Admitting: Internal Medicine

## 2012-01-21 ENCOUNTER — Ambulatory Visit (INDEPENDENT_AMBULATORY_CARE_PROVIDER_SITE_OTHER): Payer: Medicare Other | Admitting: Internal Medicine

## 2012-01-21 VITALS — BP 112/74 | Temp 98.8°F | Wt 174.0 lb

## 2012-01-21 DIAGNOSIS — G252 Other specified forms of tremor: Secondary | ICD-10-CM | POA: Diagnosis not present

## 2012-01-21 DIAGNOSIS — T148XXA Other injury of unspecified body region, initial encounter: Secondary | ICD-10-CM

## 2012-01-21 DIAGNOSIS — J449 Chronic obstructive pulmonary disease, unspecified: Secondary | ICD-10-CM

## 2012-01-21 DIAGNOSIS — G250 Essential tremor: Secondary | ICD-10-CM | POA: Diagnosis not present

## 2012-01-21 MED ORDER — CLONAZEPAM 0.5 MG PO TABS: 0.2500 mg | ORAL_TABLET | Freq: Two times a day (BID) | ORAL | Status: DC | PRN

## 2012-01-21 MED ORDER — METOPROLOL SUCCINATE ER 50 MG PO TB24: 50.0000 mg | ORAL_TABLET | Freq: Every day | ORAL | Status: DC

## 2012-01-21 MED ORDER — TIOTROPIUM BROMIDE MONOHYDRATE 18 MCG IN CAPS: 18.0000 ug | ORAL_CAPSULE | Freq: Every day | RESPIRATORY_TRACT | Status: DC

## 2012-01-21 MED ORDER — BECLOMETHASONE DIPROPIONATE 80 MCG/ACT IN AERS: 2.0000 | INHALATION_SPRAY | Freq: Two times a day (BID) | RESPIRATORY_TRACT | Status: DC

## 2012-01-21 NOTE — Telephone Encounter (Signed)
Ok to refill x 2  

## 2012-01-21 NOTE — Assessment & Plan Note (Signed)
78 year-old status post fall and subsequent hematoma to her right chin. Her symptoms are resolving. I suggest patient use warm compress over the next one week.  Patient advised to call office if symptoms persist or worsen.

## 2012-01-21 NOTE — Telephone Encounter (Signed)
#  30 called in.  

## 2012-01-21 NOTE — Patient Instructions (Signed)
Use warm compress on your chin Use your inhalers as directed

## 2012-01-21 NOTE — Progress Notes (Signed)
Subjective:    Patient ID: Christina Mckinney, female    DOB: 05/30/1932, 78 y.o.   MRN: [id number]  HPI  A 78 year old white female for evaluation after fall. She reports slipping on her stairs on Friday and fell forward bruising her chin and her lower extremities. She had a larger hematoma over the weekend which has improved in size. She still has persistent bruise of right lower chin. No significant pain.  Interval history-the patient had pulmonary function testing due to complaints of chronic cough and dyspnea. She was a remote smoker. PFT showed mild obstructive airway disease especially in small airways. She had insignificant response to bronchodilator. She had air-trapping with increased residual volume. Diffusion mildly reduced. Her recent CT scan of chest showed mild changes of emphysema.  Benign tremor-patient did not notice significant difference switching from metoprolol to atenolol. Patient reports restart metoprolol on her own.  Review of Systems    negative for chest pain,  Chronic hoarseness  Past Medical History  Diagnosis Date  . Depressive disorder, not elsewhere classified   . Diverticulitis   . GERD (gastroesophageal reflux disease)   . Other and unspecified hyperlipidemia   . Unspecified essential hypertension   . Other abnormal glucose   . Unspecified hypothyroidism   . Personal history of colonic polyps   . Anxiety state, unspecified   . Personal history of peptic ulcer disease   . Osteoarthritis   . Gout, unspecified   . Essential and other specified forms of tremor   . Internal hemorrhoids without mention of complication   . Anemia, unspecified   . Unspecified disorder of bladder   . Pulmonary embolism     History   Social History  . Marital Status: Widowed    Spouse Name: N/A    Number of Children: 3  . Years of Education: N/A   Occupational History  . Retired     Social History Main Topics  . Smoking status: Former Games developer  . Smokeless tobacco:  Never Used  . Alcohol Use: Yes     occasional wine   . Drug Use: No  . Sexually Active: Not on file   Other Topics Concern  . Not on file   Social History Narrative  . No narrative on file    Past Surgical History  Procedure Date  . Cholecystectomy   . Abdominal hysterectomy   . Nasal septum surgery   . Shoulder surgery   . Knee arthroscopy     right     Family History  Problem Relation Age of Onset  . Colon cancer Neg Hx     Not on File  Current Outpatient Prescriptions on File Prior to Visit  Medication Sig Dispense Refill  . azelastine (ASTELIN) 137 MCG/SPRAY nasal spray 1 spray by Nasal route daily as needed. Use in each nostril as directed       . Cholecalciferol (VITAMIN D) 2000 UNITS tablet Take 2,000 Units by mouth daily.        . citalopram (CELEXA) 20 MG tablet Take 1 tablet (20 mg total) by mouth daily.  90 tablet  1  . clonazePAM (KLONOPIN) 0.5 MG tablet Take 0.5 tablets (0.25 mg total) by mouth 2 (two) times daily as needed.  30 tablet  2  . co-enzyme Q-10 30 MG capsule Take 30 mg by mouth 3 (three) times daily.        . cyclobenzaprine (FLEXERIL) 10 MG tablet Take 10 mg by mouth 3 (three) times daily as  needed.        . darifenacin (ENABLEX) 15 MG 24 hr tablet Take 1 tablet (15 mg total) by mouth daily.  90 tablet  1  . diclofenac sodium (VOLTAREN) 1 % GEL Apply 2 grams qid.       . febuxostat (ULORIC) 40 MG tablet Take 40 mg by mouth daily.        . fluticasone (FLONASE) 50 MCG/ACT nasal spray Place 2 sprays into the nose daily.  16 g  3  . furosemide (LASIX) 40 MG tablet Take 1 tablet (40 mg total) by mouth daily. As directed.  90 tablet  1  . gabapentin (NEURONTIN) 300 MG capsule Take 1 capsule (300 mg total) by mouth daily.  90 capsule  1  . Glucosamine-Chondroitin 500-250 MG CAPS Take 1 capsule by mouth 2 (two) times daily.        Marland Kitchen HYDROcodone-acetaminophen (VICODIN) 5-500 MG per tablet Take 1 tablet by mouth every 6 (six) hours as needed.        .  lansoprazole (PREVACID) 30 MG capsule Take 1 capsule (30 mg total) by mouth 2 (two) times daily.  60 capsule  0  . levothyroxine (SYNTHROID, LEVOTHROID) 50 MCG tablet Take 1 tablet (50 mcg total) by mouth daily.  90 tablet  1  . losartan (COZAAR) 100 MG tablet Take 1 tablet (100 mg total) by mouth daily.  90 tablet  1  . Olopatadine HCl (PATADAY) 0.2 % SOLN Apply 1 drop to eye daily. Each eye.       . propranolol (INDERAL) 60 MG tablet Take 1 tablet (60 mg total) by mouth 2 (two) times daily.  60 tablet  3  . simvastatin (ZOCOR) 40 MG tablet Take 1 tablet (40 mg total) by mouth at bedtime.  90 tablet  1    BP 112/74  Temp(Src) 98.8 F (37.1 C) (Oral)  Wt 174 lb (78.926 kg)     Objective:   Physical Exam  Constitutional: She is oriented to person, place, and time. She appears well-developed and well-nourished.  HENT:  Head: Normocephalic and atraumatic.       4-5 oblong bruise of right lower chin  Cardiovascular: Normal rate, regular rhythm and normal heart sounds.   Pulmonary/Chest: Effort normal and breath sounds normal. She has no wheezes. She has no rales.  Musculoskeletal: She exhibits no edema.  Neurological: She is alert and oriented to person, place, and time.       Minimal resting tremor  Skin: Skin is warm and dry.  Psychiatric: She has a normal mood and affect. Her behavior is normal.          Assessment & Plan:

## 2012-01-21 NOTE — Assessment & Plan Note (Addendum)
Patient initially thought there was some improvement with switching from metoprolol to propranolol. However, over time she has not noticed an appreciable difference. Patient has resumed metoprolol 50 mg once daily.

## 2012-01-21 NOTE — Assessment & Plan Note (Signed)
PFTs shows mild obstructive airway disease-small airway. Insignificant response to bronchodilator. Air-trapping with increased residual on him. Diffusion mildly reduced.  Previous CT of the chest suggestive of mild emphysema. I suggest patient start Qvar 2 puffs twice daily and spirva daily.  Reassess in 2 months.

## 2012-01-23 DIAGNOSIS — M1710 Unilateral primary osteoarthritis, unspecified knee: Secondary | ICD-10-CM | POA: Diagnosis not present

## 2012-01-23 DIAGNOSIS — IMO0002 Reserved for concepts with insufficient information to code with codable children: Secondary | ICD-10-CM | POA: Diagnosis not present

## 2012-01-28 ENCOUNTER — Ambulatory Visit: Payer: Medicare Other | Admitting: Internal Medicine

## 2012-02-04 ENCOUNTER — Encounter: Payer: Self-pay | Admitting: Internal Medicine

## 2012-02-04 ENCOUNTER — Ambulatory Visit (INDEPENDENT_AMBULATORY_CARE_PROVIDER_SITE_OTHER): Payer: Medicare Other | Admitting: Internal Medicine

## 2012-02-04 VITALS — BP 110/62 | Temp 98.1°F | Wt 175.0 lb

## 2012-02-04 DIAGNOSIS — J449 Chronic obstructive pulmonary disease, unspecified: Secondary | ICD-10-CM

## 2012-02-04 DIAGNOSIS — J40 Bronchitis, not specified as acute or chronic: Secondary | ICD-10-CM | POA: Diagnosis not present

## 2012-02-04 MED ORDER — TIOTROPIUM BROMIDE MONOHYDRATE 18 MCG IN CAPS: 18.0000 ug | ORAL_CAPSULE | Freq: Every day | RESPIRATORY_TRACT | Status: DC

## 2012-02-04 MED ORDER — DOXYCYCLINE HYCLATE 100 MG PO TABS: 100.0000 mg | ORAL_TABLET | Freq: Two times a day (BID) | ORAL | Status: AC

## 2012-02-04 NOTE — Progress Notes (Signed)
Subjective:    Patient ID: Christina Mckinney, female    DOB: 02/24/1932, 78 y.o.   MRN: [id number]  HPI  78 year old white female with history of COPD complains of new onset cough.  Her symptoms started on Saturday and progressed to laryngitis. She is coughing up yellowish/brownish discolored sputum. She denies fever or chills.  Review of Systems Negative for shortness of breath Positive for mild sore throat  Past Medical History  Diagnosis Date  . Depressive disorder, not elsewhere classified   . Diverticulitis   . GERD (gastroesophageal reflux disease)   . Other and unspecified hyperlipidemia   . Unspecified essential hypertension   . Other abnormal glucose   . Unspecified hypothyroidism   . Personal history of colonic polyps   . Anxiety state, unspecified   . Personal history of peptic ulcer disease   . Osteoarthritis   . Gout, unspecified   . Essential and other specified forms of tremor   . Internal hemorrhoids without mention of complication   . Anemia, unspecified   . Unspecified disorder of bladder   . Pulmonary embolism     History   Social History  . Marital Status: Widowed    Spouse Name: N/A    Number of Children: 3  . Years of Education: N/A   Occupational History  . Retired     Social History Main Topics  . Smoking status: Former Games developer  . Smokeless tobacco: Never Used  . Alcohol Use: Yes     occasional wine   . Drug Use: No  . Sexually Active: Not on file   Other Topics Concern  . Not on file   Social History Narrative  . No narrative on file    Past Surgical History  Procedure Date  . Cholecystectomy   . Abdominal hysterectomy   . Nasal septum surgery   . Shoulder surgery   . Knee arthroscopy     right     Family History  Problem Relation Age of Onset  . Colon cancer Neg Hx     Not on File  Current Outpatient Prescriptions on File Prior to Visit  Medication Sig Dispense Refill  . azelastine (ASTELIN) 137 MCG/SPRAY nasal spray 1  spray by Nasal route daily as needed. Use in each nostril as directed       . beclomethasone (QVAR) 80 MCG/ACT inhaler Inhale 2 puffs into the lungs 2 (two) times daily.  1 Inhaler  1  . Cholecalciferol (VITAMIN D) 2000 UNITS tablet Take 2,000 Units by mouth daily.        . citalopram (CELEXA) 20 MG tablet Take 1 tablet (20 mg total) by mouth daily.  90 tablet  1  . clonazePAM (KLONOPIN) 0.5 MG tablet Take 0.5 tablets (0.25 mg total) by mouth 2 (two) times daily as needed.  30 tablet  2  . co-enzyme Q-10 30 MG capsule Take 30 mg by mouth 3 (three) times daily.        . cyclobenzaprine (FLEXERIL) 10 MG tablet Take 10 mg by mouth 3 (three) times daily as needed.        . darifenacin (ENABLEX) 15 MG 24 hr tablet Take 1 tablet (15 mg total) by mouth daily.  90 tablet  1  . diclofenac sodium (VOLTAREN) 1 % GEL Apply 2 grams qid.       . febuxostat (ULORIC) 40 MG tablet Take 40 mg by mouth daily.        . fluticasone (FLONASE) 50 MCG/ACT nasal  spray Place 2 sprays into the nose daily.  16 g  3  . furosemide (LASIX) 40 MG tablet Take 1 tablet (40 mg total) by mouth daily. As directed.  90 tablet  1  . gabapentin (NEURONTIN) 300 MG capsule Take 1 capsule (300 mg total) by mouth daily.  90 capsule  1  . Glucosamine-Chondroitin 500-250 MG CAPS Take 1 capsule by mouth 2 (two) times daily.        Marland Kitchen HYDROcodone-acetaminophen (VICODIN) 5-500 MG per tablet Take 1 tablet by mouth every 6 (six) hours as needed.        . lansoprazole (PREVACID) 30 MG capsule Take 1 capsule (30 mg total) by mouth 2 (two) times daily.  60 capsule  0  . levothyroxine (SYNTHROID, LEVOTHROID) 50 MCG tablet Take 1 tablet (50 mcg total) by mouth daily.  90 tablet  1  . losartan (COZAAR) 100 MG tablet Take 1 tablet (100 mg total) by mouth daily.  90 tablet  1  . metoprolol succinate (TOPROL-XL) 50 MG 24 hr tablet Take 1 tablet (50 mg total) by mouth daily. Take with or immediately following a meal.  90 tablet  3  . Olopatadine HCl  (PATADAY) 0.2 % SOLN Apply 1 drop to eye daily. Each eye.       . simvastatin (ZOCOR) 40 MG tablet Take 1 tablet (40 mg total) by mouth at bedtime.  90 tablet  1    BP 110/62  Temp(Src) 98.1 F (36.7 C) (Oral)  Wt 175 lb (79.379 kg)       Objective:   Physical Exam  Constitutional: She is oriented to person, place, and time. She appears well-developed and well-nourished.       Mild oropharyngeal erythema  HENT:  Head: Normocephalic and atraumatic.  Right Ear: External ear normal.  Left Ear: External ear normal.  Cardiovascular: Normal rate, regular rhythm and normal heart sounds.   Pulmonary/Chest: Effort normal.       Slight coarse breath sounds at right base  Musculoskeletal: She exhibits no edema.  Neurological: She is alert and oriented to person, place, and time.  Psychiatric: She has a normal mood and affect. Her behavior is normal.          Assessment & Plan:

## 2012-02-04 NOTE — Patient Instructions (Signed)
Please call our office if your symptoms do not improve or gets worse.  

## 2012-02-04 NOTE — Assessment & Plan Note (Signed)
78 year old female with mild COPD exacerbation. Treat with doxycycline 100 mg twice a day x10 days. Continue Qvar maintenance inhaler. Sample of Spiriva provided.  Patient advised to call office if symptoms persist or worsen.

## 2012-02-06 ENCOUNTER — Ambulatory Visit: Payer: Medicare Other | Admitting: Internal Medicine

## 2012-02-19 ENCOUNTER — Other Ambulatory Visit: Payer: Self-pay | Admitting: *Deleted

## 2012-02-19 MED ORDER — BECLOMETHASONE DIPROPIONATE 80 MCG/ACT IN AERS: 2.0000 | INHALATION_SPRAY | Freq: Two times a day (BID) | RESPIRATORY_TRACT | Status: DC

## 2012-02-19 MED ORDER — TIOTROPIUM BROMIDE MONOHYDRATE 18 MCG IN CAPS: 18.0000 ug | ORAL_CAPSULE | Freq: Every day | RESPIRATORY_TRACT | Status: DC

## 2012-02-19 MED ORDER — FLUTICASONE PROPIONATE 50 MCG/ACT NA SUSP: 2.0000 | Freq: Every day | NASAL | Status: DC

## 2012-03-24 ENCOUNTER — Ambulatory Visit (INDEPENDENT_AMBULATORY_CARE_PROVIDER_SITE_OTHER): Payer: Medicare Other | Admitting: Internal Medicine

## 2012-03-24 ENCOUNTER — Encounter: Payer: Self-pay | Admitting: Internal Medicine

## 2012-03-24 VITALS — BP 132/68 | HR 80 | Temp 98.2°F | Wt 176.0 lb

## 2012-03-24 DIAGNOSIS — G250 Essential tremor: Secondary | ICD-10-CM | POA: Diagnosis not present

## 2012-03-24 DIAGNOSIS — G4700 Insomnia, unspecified: Secondary | ICD-10-CM

## 2012-03-24 DIAGNOSIS — G252 Other specified forms of tremor: Secondary | ICD-10-CM | POA: Diagnosis not present

## 2012-03-24 DIAGNOSIS — J449 Chronic obstructive pulmonary disease, unspecified: Secondary | ICD-10-CM

## 2012-03-24 DIAGNOSIS — I8310 Varicose veins of unspecified lower extremity with inflammation: Secondary | ICD-10-CM

## 2012-03-24 DIAGNOSIS — F5104 Psychophysiologic insomnia: Secondary | ICD-10-CM

## 2012-03-24 MED ORDER — CLONAZEPAM 0.5 MG PO TABS: 0.2500 mg | ORAL_TABLET | Freq: Every evening | ORAL | Status: DC | PRN

## 2012-03-24 MED ORDER — FEBUXOSTAT 40 MG PO TABS: 40.0000 mg | ORAL_TABLET | Freq: Every day | ORAL | Status: DC

## 2012-03-24 MED ORDER — DICLOFENAC SODIUM 1 % TD GEL: 1.0000 "application " | Freq: Four times a day (QID) | TRANSDERMAL | Status: DC

## 2012-03-24 MED ORDER — GABAPENTIN 300 MG PO CAPS: 300.0000 mg | ORAL_CAPSULE | Freq: Every day | ORAL | Status: DC

## 2012-03-24 NOTE — Assessment & Plan Note (Signed)
Improved with maintenance inhalers. Continue Qvar and Spiriva.

## 2012-03-24 NOTE — Progress Notes (Signed)
Subjective:    Patient ID: Christina Mckinney, female    DOB: 1932-01-27, 78 y.o.   MRN: [id number]  HPI  78 year old white female for followup regarding COPD. Since starting her maintenance inhalers patient reports her breathing has improved. She has less dyspnea with exertion.  Patient complains of worsening essential tremor. Her propranolol was switched back to metoprolol due to concern for exacerbation of her COPD.  Patient reports suffering mild fall since her previous visit. She occasionally takes clonazepam during the day for anxiety symptoms.  Review of Systems Complains of chronic intermittent burning sensation in her lower extremities. Negative for shortness of breath or chest pain  Past Medical History  Diagnosis Date  . Depressive disorder, not elsewhere classified   . Diverticulitis   . GERD (gastroesophageal reflux disease)   . Other and unspecified hyperlipidemia   . Unspecified essential hypertension   . Other abnormal glucose   . Unspecified hypothyroidism   . Personal history of colonic polyps   . Anxiety state, unspecified   . Personal history of peptic ulcer disease   . Osteoarthritis   . Gout, unspecified   . Essential and other specified forms of tremor   . Internal hemorrhoids without mention of complication   . Anemia, unspecified   . Unspecified disorder of bladder   . Pulmonary embolism     History   Social History  . Marital Status: Widowed    Spouse Name: N/A    Number of Children: 3  . Years of Education: N/A   Occupational History  . Retired     Social History Main Topics  . Smoking status: Former Games developer  . Smokeless tobacco: Never Used  . Alcohol Use: Yes     occasional wine   . Drug Use: No  . Sexually Active: Not on file   Other Topics Concern  . Not on file   Social History Narrative  . No narrative on file    Past Surgical History  Procedure Date  . Cholecystectomy   . Abdominal hysterectomy   . Nasal septum surgery   .  Shoulder surgery   . Knee arthroscopy     right     Family History  Problem Relation Age of Onset  . Colon cancer Neg Hx     Not on File  Current Outpatient Prescriptions on File Prior to Visit  Medication Sig Dispense Refill  . azelastine (ASTELIN) 137 MCG/SPRAY nasal spray 1 spray by Nasal route daily as needed. Use in each nostril as directed       . beclomethasone (QVAR) 80 MCG/ACT inhaler Inhale 2 puffs into the lungs 2 (two) times daily.  1 Inhaler  3  . Cholecalciferol (VITAMIN D) 2000 UNITS tablet Take 2,000 Units by mouth daily.        . citalopram (CELEXA) 20 MG tablet Take 1 tablet (20 mg total) by mouth daily.  90 tablet  1  . co-enzyme Q-10 30 MG capsule Take 30 mg by mouth 3 (three) times daily.        Marland Kitchen darifenacin (ENABLEX) 15 MG 24 hr tablet Take 1 tablet (15 mg total) by mouth daily.  90 tablet  1  . fluticasone (FLONASE) 50 MCG/ACT nasal spray Place 2 sprays into the nose daily.  16 g  3  . furosemide (LASIX) 40 MG tablet Take 1 tablet (40 mg total) by mouth daily. As directed.  90 tablet  1  . Glucosamine-Chondroitin 500-250 MG CAPS Take 1 capsule by  mouth 2 (two) times daily.        Marland Kitchen HYDROcodone-acetaminophen (VICODIN) 5-500 MG per tablet Take 1 tablet by mouth every 6 (six) hours as needed.        . lansoprazole (PREVACID) 30 MG capsule Take 1 capsule (30 mg total) by mouth 2 (two) times daily.  60 capsule  0  . levothyroxine (SYNTHROID, LEVOTHROID) 50 MCG tablet Take 1 tablet (50 mcg total) by mouth daily.  90 tablet  1  . losartan (COZAAR) 100 MG tablet Take 1 tablet (100 mg total) by mouth daily.  90 tablet  1  . metoprolol succinate (TOPROL-XL) 50 MG 24 hr tablet Take 1 tablet (50 mg total) by mouth daily. Take with or immediately following a meal.  90 tablet  3  . Olopatadine HCl (PATADAY) 0.2 % SOLN Apply 1 drop to eye daily. Each eye.       . simvastatin (ZOCOR) 40 MG tablet Take 1 tablet (40 mg total) by mouth at bedtime.  90 tablet  1  . tiotropium  (SPIRIVA HANDIHALER) 18 MCG inhalation capsule Place 1 capsule (18 mcg total) into inhaler and inhale daily.  30 capsule  3  . DISCONTD: clonazePAM (KLONOPIN) 0.5 MG tablet Take 0.5 tablets (0.25 mg total) by mouth 2 (two) times daily as needed.  30 tablet  2  . DISCONTD: diclofenac sodium (VOLTAREN) 1 % GEL Apply 2 grams qid.       Marland Kitchen DISCONTD: febuxostat (ULORIC) 40 MG tablet Take 40 mg by mouth daily.        Marland Kitchen DISCONTD: gabapentin (NEURONTIN) 300 MG capsule Take 1 capsule (300 mg total) by mouth daily.  90 capsule  1  . clonazePAM (KLONOPIN) 0.5 MG tablet Take 1 tablet (0.5 mg total) by mouth 2 (two) times daily as needed for anxiety.  60 tablet  2  . DISCONTD: propranolol (INDERAL) 60 MG tablet Take 1 tablet (60 mg total) by mouth 2 (two) times daily.  60 tablet  3    BP 132/68  Pulse 80  Temp(Src) 98.2 F (36.8 C) (Oral)  Wt 176 lb (79.833 kg)       Objective:   Physical Exam  Constitutional: She is oriented to person, place, and time. She appears well-developed and well-nourished.  HENT:  Head: Normocephalic and atraumatic.  Cardiovascular: Normal rate, regular rhythm and normal heart sounds.   No murmur heard.      Lower extremity varicosities left greater than right  Pulmonary/Chest: Effort normal and breath sounds normal. She has no wheezes.  Musculoskeletal: She exhibits no edema.  Neurological: She is alert and oriented to person, place, and time.       Resting tremor  Psychiatric: She has a normal mood and affect. Her behavior is normal.          Assessment & Plan:

## 2012-03-24 NOTE — Assessment & Plan Note (Signed)
Patient advised to use clonazepam only at bedtime. Daytime doses contributing/exacerbating fall risk.

## 2012-03-24 NOTE — Assessment & Plan Note (Signed)
Patient could not tolerate using compression hose. She is to consider vascular referral with Dr. Arbie Cookey.

## 2012-03-24 NOTE — Assessment & Plan Note (Signed)
Slightly worse since switching from propranolol to metoprolol. I suggest patient continue with the metoprolol as nonselective beta blocker may exacerbate her obstructive airway disease.

## 2012-03-24 NOTE — Patient Instructions (Signed)
Please complete the following lab tests before your next follow up appointment: BMET - 401.9 TSH - 244.9 

## 2012-03-25 MED ORDER — CITALOPRAM HYDROBROMIDE 20 MG PO TABS: 20.0000 mg | ORAL_TABLET | Freq: Every day | ORAL | Status: DC

## 2012-03-25 NOTE — Progress Notes (Signed)
Addended by: Alfred Levins D on: 03/25/2012 07:39 AM   Modules accepted: Orders

## 2012-03-28 ENCOUNTER — Telehealth: Payer: Self-pay

## 2012-03-28 DIAGNOSIS — I8310 Varicose veins of unspecified lower extremity with inflammation: Secondary | ICD-10-CM

## 2012-03-28 NOTE — Telephone Encounter (Signed)
Patient called Triage line indication she called Dr.Early (as recommended by Dr.Yoo) to schedule an appointment. Patient was told we need to send over records before they will set her up for an appointment.  Patient gave fax number to Dr.Early,Todd [phone]

## 2012-03-28 NOTE — Telephone Encounter (Signed)
Please place order for vascular consult re: lower extremity varicose veins

## 2012-03-28 NOTE — Telephone Encounter (Signed)
I called Dr.Early's office to verify what records are needed in order for patient to schedule appointment and I was told patient had not been seen in a long time and she needs a new referral    Please advise

## 2012-04-01 ENCOUNTER — Other Ambulatory Visit: Payer: Self-pay

## 2012-04-01 DIAGNOSIS — I83893 Varicose veins of bilateral lower extremities with other complications: Secondary | ICD-10-CM

## 2012-04-11 NOTE — Telephone Encounter (Signed)
Was order placed?

## 2012-04-11 NOTE — Telephone Encounter (Signed)
Yes, order was placed on 03/28/12.  [facility] will contact pt directly

## 2012-04-15 ENCOUNTER — Ambulatory Visit (INDEPENDENT_AMBULATORY_CARE_PROVIDER_SITE_OTHER): Payer: Medicare Other | Admitting: Internal Medicine

## 2012-04-15 ENCOUNTER — Telehealth: Payer: Self-pay

## 2012-04-15 VITALS — BP 148/82 | HR 73 | Temp 97.9°F | Wt 175.0 lb

## 2012-04-15 DIAGNOSIS — R1013 Epigastric pain: Secondary | ICD-10-CM | POA: Diagnosis not present

## 2012-04-15 DIAGNOSIS — R0602 Shortness of breath: Secondary | ICD-10-CM | POA: Insufficient documentation

## 2012-04-15 DIAGNOSIS — R112 Nausea with vomiting, unspecified: Secondary | ICD-10-CM

## 2012-04-15 DIAGNOSIS — M549 Dorsalgia, unspecified: Secondary | ICD-10-CM

## 2012-04-15 LAB — HEPATIC FUNCTION PANEL
ALT: 11 U/L (ref 0–35)
AST: 26 U/L (ref 0–37)
Alkaline Phosphatase: 73 U/L (ref 39–117)
Bilirubin, Direct: 0.1 mg/dL (ref 0.0–0.3)
Total Protein: 7.7 g/dL (ref 6.0–8.3)

## 2012-04-15 LAB — BASIC METABOLIC PANEL
CO2: 30 mEq/L (ref 19–32)
Glucose, Bld: 96 mg/dL (ref 70–99)
Potassium: 3.9 mEq/L (ref 3.5–5.1)
Sodium: 144 mEq/L (ref 135–145)

## 2012-04-15 LAB — POCT URINALYSIS DIPSTICK
Glucose, UA: NEGATIVE
Ketones, UA: NEGATIVE
Leukocytes, UA: NEGATIVE
Protein, UA: NEGATIVE
Urobilinogen, UA: 0.2

## 2012-04-15 LAB — CBC WITH DIFFERENTIAL/PLATELET
Eosinophils Relative: 1.9 % (ref 0.0–5.0)
HCT: 37.9 % (ref 36.0–46.0)
Monocytes Relative: 5.7 % (ref 3.0–12.0)
Neutrophils Relative %: 70.2 % (ref 43.0–77.0)
Platelets: 211 10*3/uL (ref 150.0–400.0)
RBC: 3.87 Mil/uL (ref 3.87–5.11)
WBC: 8.8 10*3/uL (ref 4.5–10.5)

## 2012-04-15 MED ORDER — LOSARTAN POTASSIUM 100 MG PO TABS: 100.0000 mg | ORAL_TABLET | Freq: Every day | ORAL | Status: DC

## 2012-04-15 MED ORDER — ONDANSETRON HCL 4 MG PO TABS: 4.0000 mg | ORAL_TABLET | Freq: Three times a day (TID) | ORAL | Status: AC | PRN

## 2012-04-15 NOTE — Telephone Encounter (Signed)
Pt c/o of right side pain, n & v, belching, sweating, not being able to get food down.  Appt made with Dr. Artist Pais at 3:30 today.

## 2012-04-15 NOTE — Assessment & Plan Note (Signed)
Patient having intermittent shortness of breath. She describes right lower chest discomfort she had similar symptoms when she had pulmonary embolism. EKG is normal. Check stat d-dimer. If positive consider repeat CT of chest. Her O2 sat and her pulse are normal.

## 2012-04-15 NOTE — Assessment & Plan Note (Addendum)
78 year old with symptoms of nausea and vomiting of unclear etiology. I doubt her symptoms are cardiac related. Question gastroenteritis. Trial of Zofran 4 mg by mouth 3 times a day as needed. The patient had EGD in June, 2012. It showed gastritis. Biopsies were negative for H. pylori. Obtain lipase and LFTs-rule out pancreatitis

## 2012-04-15 NOTE — Patient Instructions (Signed)
Please call our office if your symptoms do not improve or gets worse.  

## 2012-04-15 NOTE — Progress Notes (Signed)
Subjective:    Patient ID: Christina Mckinney, female    DOB: 1932-02-12, 78 y.o.   MRN: [id number]  HPI  78 year old white female with history of hypertension, COPD and history of pulmonary embolism presents with epigastric pain with nausea and vomiting. Her symptoms started last night. She attributed symptoms to eating eggs at local fast food restaurant. She has sense of fullness in the epigastric area despite not eating anything today. She also complains of pain in her right flank. She feels like she needs to burp but is unable. She has previous history of cholecystectomy.  She has also been feeling out of breath.  At previous visits patient complained of feeling groggy/sleepy. She decided to taper off clonazepam and citalopram. Her somnolence has significantly improved.   Review of Systems Shortness of breath, no cough, intermittent dysphagia  Past Medical History  Diagnosis Date  . Chronic depression   . Diverticulitis   . GERD (gastroesophageal reflux disease)   . Other and unspecified hyperlipidemia   . Uncontrolled hypertension as indication for native nephrectomy   . Other abnormal glucose   . Unspecified hypothyroidism   . Personal history of colonic polyps   . Anxiety state, unspecified   . Personal history of peptic ulcer disease   . Osteoarthritis   . Gout, unspecified   . Essential and other specified forms of tremor   . Internal hemorrhoids without mention of complication   . Anemia, unspecified   . Unspecified disorder of bladder   . Pulmonary embolism     History   Social History  . Marital Status: Widowed    Spouse Name: N/A    Number of Children: 3  . Years of Education: N/A   Occupational History  . Retired     Social History Main Topics  . Smoking status: Former Games developer  . Smokeless tobacco: Never Used  . Alcohol Use: Yes     occasional wine   . Drug Use: No  . Sexually Active: Not on file   Other Topics Concern  . Not on file   Social History  Narrative  . No narrative on file    Past Surgical History  Procedure Date  . Cholecystectomy   . Abdominal hysterectomy   . Nasal septum surgery   . Shoulder surgery   . Knee arthroscopy     right     Family History  Problem Relation Age of Onset  . Colon cancer Neg Hx     Not on File  Current Outpatient Prescriptions on File Prior to Visit  Medication Sig Dispense Refill  . azelastine (ASTELIN) 137 MCG/SPRAY nasal spray 1 spray by Nasal route daily as needed. Use in each nostril as directed       . beclomethasone (QVAR) 80 MCG/ACT inhaler Inhale 2 puffs into the lungs 2 (two) times daily.  1 Inhaler  3  . Cholecalciferol (VITAMIN D) 2000 UNITS tablet Take 2,000 Units by mouth daily.        Marland Kitchen co-enzyme Q-10 30 MG capsule Take 30 mg by mouth 3 (three) times daily.        Marland Kitchen darifenacin (ENABLEX) 15 MG 24 hr tablet Take 1 tablet (15 mg total) by mouth daily.  90 tablet  1  . diclofenac sodium (VOLTAREN) 1 % GEL Apply 1 application topically 4 (four) times daily. Apply 2 grams qid.  2 Tube  2  . febuxostat (ULORIC) 40 MG tablet Take 1 tablet (40 mg total) by mouth daily.  90 tablet  1  . fluticasone (FLONASE) 50 MCG/ACT nasal spray Place 2 sprays into the nose daily.  16 g  3  . furosemide (LASIX) 40 MG tablet Take 1 tablet (40 mg total) by mouth daily. As directed.  90 tablet  1  . gabapentin (NEURONTIN) 300 MG capsule Take 1 capsule (300 mg total) by mouth daily.  90 capsule  1  . Glucosamine-Chondroitin 500-250 MG CAPS Take 1 capsule by mouth 2 (two) times daily.        Marland Kitchen HYDROcodone-acetaminophen (VICODIN) 5-500 MG per tablet Take 1 tablet by mouth every 6 (six) hours as needed.        . lansoprazole (PREVACID) 30 MG capsule Take 1 capsule (30 mg total) by mouth 2 (two) times daily.  60 capsule  0  . levothyroxine (SYNTHROID, LEVOTHROID) 50 MCG tablet Take 1 tablet (50 mcg total) by mouth daily.  90 tablet  1  . losartan (COZAAR) 100 MG tablet Take 1 tablet (100 mg total) by  mouth daily.  90 tablet  1  . metoprolol succinate (TOPROL-XL) 50 MG 24 hr tablet Take 1 tablet (50 mg total) by mouth daily. Take with or immediately following a meal.  90 tablet  3  . Olopatadine HCl (PATADAY) 0.2 % SOLN Apply 1 drop to eye daily. Each eye.       . simvastatin (ZOCOR) 40 MG tablet Take 1 tablet (40 mg total) by mouth at bedtime.  90 tablet  1  . tiotropium (SPIRIVA HANDIHALER) 18 MCG inhalation capsule Place 1 capsule (18 mcg total) into inhaler and inhale daily.  30 capsule  3  . DISCONTD: propranolol (INDERAL) 60 MG tablet Take 1 tablet (60 mg total) by mouth 2 (two) times daily.  60 tablet  3    BP 148/82  Pulse 73  Temp(Src) 97.9 F (36.6 C) (Oral)  Wt 175 lb (79.379 kg)  SpO2 97%  EKG shows normal sinus rhythm at 63 beats per minute. Left atrial enlargement.    Objective:   Physical Exam  Constitutional: She is oriented to [place], place, and time. She appears well-developed and well-nourished. No distress.  HENT:  Head: Normocephalic and atraumatic.  Right Ear: External ear normal.  Left Ear: External ear normal.  Mouth/Throat: Oropharynx is clear and moist.  Neck: Neck supple.  Cardiovascular: Normal rate, regular rhythm and normal heart sounds.   No murmur heard. Pulmonary/Chest: Effort normal and breath sounds normal. She has no wheezes. She has no rales.  Abdominal: Soft. Bowel sounds are normal. She exhibits no mass. There is no rebound and no guarding.       Epigastric tenderness  Musculoskeletal: She exhibits no edema.  Lymphadenopathy:    She has no cervical adenopathy.  Neurological: She is alert and oriented to [place]ck[place], place, and time.  Skin: Skin is warm and dry. She is not diaphoretic.  Psychiatric:       Slightly anxious          Assessment & Plan:

## 2012-04-16 ENCOUNTER — Other Ambulatory Visit: Payer: Self-pay | Admitting: Internal Medicine

## 2012-04-16 ENCOUNTER — Ambulatory Visit (INDEPENDENT_AMBULATORY_CARE_PROVIDER_SITE_OTHER)
Admission: RE | Admit: 2012-04-16 | Discharge: 2012-04-16 | Disposition: A | Discharge status: Home / Self Care | Payer: Medicare Other | Source: Ambulatory Visit | Attending: Internal Medicine | Admitting: Internal Medicine

## 2012-04-16 DIAGNOSIS — I2699 Other pulmonary embolism without acute cor pulmonale: Secondary | ICD-10-CM

## 2012-04-16 DIAGNOSIS — R0602 Shortness of breath: Secondary | ICD-10-CM | POA: Diagnosis not present

## 2012-04-16 DIAGNOSIS — R918 Other nonspecific abnormal finding of lung field: Secondary | ICD-10-CM | POA: Diagnosis not present

## 2012-04-16 DIAGNOSIS — J984 Other disorders of lung: Secondary | ICD-10-CM | POA: Diagnosis not present

## 2012-04-16 MED ORDER — IOHEXOL 300 MG/ML  SOLN
80.0000 mL | Freq: Once | INTRAMUSCULAR | Status: AC | PRN
  Administered 2012-04-16: 80 mL via INTRAVENOUS

## 2012-04-17 ENCOUNTER — Other Ambulatory Visit: Payer: Self-pay | Admitting: Cardiology

## 2012-04-17 DIAGNOSIS — I6529 Occlusion and stenosis of unspecified carotid artery: Secondary | ICD-10-CM

## 2012-04-18 ENCOUNTER — Ambulatory Visit (INDEPENDENT_AMBULATORY_CARE_PROVIDER_SITE_OTHER): Payer: Medicare Other | Admitting: Internal Medicine

## 2012-04-18 ENCOUNTER — Encounter: Payer: Self-pay | Admitting: Internal Medicine

## 2012-04-18 VITALS — BP 126/66 | Temp 98.4°F | Wt 175.0 lb

## 2012-04-18 DIAGNOSIS — F411 Generalized anxiety disorder: Secondary | ICD-10-CM

## 2012-04-18 DIAGNOSIS — R911 Solitary pulmonary nodule: Secondary | ICD-10-CM | POA: Diagnosis not present

## 2012-04-18 DIAGNOSIS — K5900 Constipation, unspecified: Secondary | ICD-10-CM | POA: Diagnosis not present

## 2012-04-18 DIAGNOSIS — R0602 Shortness of breath: Secondary | ICD-10-CM

## 2012-04-18 MED ORDER — PEG 3350-KCL-NA BICARB-NACL 420 G PO SOLR: 1000.0000 mL | Freq: Two times a day (BID) | ORAL | Status: AC

## 2012-04-18 MED ORDER — ALPRAZOLAM 0.5 MG PO TBDP: 0.5000 mg | ORAL_TABLET | Freq: Every evening | ORAL | Status: AC | PRN

## 2012-04-18 NOTE — Assessment & Plan Note (Signed)
Small 4 mm nodules in right lung field.  Plan to repeat CT of Chest in 1 year.

## 2012-04-18 NOTE — Assessment & Plan Note (Signed)
Improved.  CT of chest negative for PE.  Continue COPD maintenance inhalers.

## 2012-04-18 NOTE — Patient Instructions (Signed)
Use sitz bath daily for 3-5 days Start metamucil supplement once you have had a good bowel movement Increase your fluid intake.

## 2012-04-18 NOTE — Progress Notes (Signed)
Subjective:    Patient ID: Christina Mckinney, female    DOB: October 29, 1932, 78 y.o.   MRN: [id number]  HPI  78 year old white female recently seen for nausea vomiting, and right chest wall pain or shortness of breath for followup. Fortunately her CT of chest was negative for pulmonary embolism. Small 4 mm pulmonary nodules are noted in her right lung field.  Her nausea and vomiting has resolved. She is struggling with constipation.  Review of Systems Negative for shortness of breath  Past Medical History  Diagnosis Date  . Depressive disorder, not elsewhere classified   . Diverticulitis   . GERD (gastroesophageal reflux disease)   . Other and unspecified hyperlipidemia   . Unspecified essential hypertension   . Other abnormal glucose   . Unspecified hypothyroidism   . Personal history of colonic polyps   . Anxiety state, unspecified   . Personal history of peptic ulcer disease   . Osteoarthritis   . Gout, unspecified   . Essential and other specified forms of tremor   . Internal hemorrhoids without mention of complication   . Anemia, unspecified   . Unspecified disorder of bladder   . Pulmonary embolism     History   Social History  . Marital Status: Widowed    Spouse Name: N/A    Number of Children: 3  . Years of Education: N/A   Occupational History  . Retired     Social History Main Topics  . Smoking status: Former Games developer  . Smokeless tobacco: Never Used  . Alcohol Use: Yes     occasional wine   . Drug Use: No  . Sexually Active: Not on file   Other Topics Concern  . Not on file   Social History Narrative  . No narrative on file    Past Surgical History  Procedure Date  . Cholecystectomy   . Abdominal hysterectomy   . Nasal septum surgery   . Shoulder surgery   . Knee arthroscopy     right     Family History  Problem Relation Age of Onset  . Colon cancer Neg Hx     Not on File  Current Outpatient Prescriptions on File Prior to Visit    Medication Sig Dispense Refill  . azelastine (ASTELIN) 137 MCG/SPRAY nasal spray 1 spray by Nasal route daily as needed. Use in each nostril as directed       . beclomethasone (QVAR) 80 MCG/ACT inhaler Inhale 2 puffs into the lungs 2 (two) times daily.  1 Inhaler  3  . Cholecalciferol (VITAMIN D) 2000 UNITS tablet Take 2,000 Units by mouth daily.        Marland Kitchen co-enzyme Q-10 30 MG capsule Take 30 mg by mouth 3 (three) times daily.        Marland Kitchen darifenacin (ENABLEX) 15 MG 24 hr tablet Take 1 tablet (15 mg total) by mouth daily.  90 tablet  1  . diclofenac sodium (VOLTAREN) 1 % GEL Apply 1 application topically 4 (four) times daily. Apply 2 grams qid.  2 Tube  2  . febuxostat (ULORIC) 40 MG tablet Take 1 tablet (40 mg total) by mouth daily.  90 tablet  1  . fluticasone (FLONASE) 50 MCG/ACT nasal spray Place 2 sprays into the nose daily.  16 g  3  . furosemide (LASIX) 40 MG tablet Take 1 tablet (40 mg total) by mouth daily. As directed.  90 tablet  1  . gabapentin (NEURONTIN) 300 MG capsule Take 1 capsule (  300 mg total) by mouth daily.  90 capsule  1  . Glucosamine-Chondroitin 500-250 MG CAPS Take 1 capsule by mouth 2 (two) times daily.        Marland Kitchen HYDROcodone-acetaminophen (VICODIN) 5-500 MG per tablet Take 1 tablet by mouth every 6 (six) hours as needed.        . lansoprazole (PREVACID) 30 MG capsule Take 1 capsule (30 mg total) by mouth 2 (two) times daily.  60 capsule  0  . levothyroxine (SYNTHROID, LEVOTHROID) 50 MCG tablet Take 1 tablet (50 mcg total) by mouth daily.  90 tablet  1  . losartan (COZAAR) 100 MG tablet Take 1 tablet (100 mg total) by mouth daily.  90 tablet  1  . metoprolol succinate (TOPROL-XL) 50 MG 24 hr tablet Take 1 tablet (50 mg total) by mouth daily. Take with or immediately following a meal.  90 tablet  3  . Olopatadine HCl (PATADAY) 0.2 % SOLN Apply 1 drop to eye daily. Each eye.       . ondansetron (ZOFRAN) 4 MG tablet Take 1 tablet (4 mg total) by mouth every 8 (eight) hours as  needed for nausea.  30 tablet  0  . simvastatin (ZOCOR) 40 MG tablet Take 1 tablet (40 mg total) by mouth at bedtime.  90 tablet  1  . tiotropium (SPIRIVA HANDIHALER) 18 MCG inhalation capsule Place 1 capsule (18 mcg total) into inhaler and inhale daily.  30 capsule  3  . DISCONTD: propranolol (INDERAL) 60 MG tablet Take 1 tablet (60 mg total) by mouth 2 (two) times daily.  60 tablet  3    BP 126/66  Temp(Src) 98.4 F (36.9 C) (Oral)  Wt 175 lb (79.379 kg)       Objective:   Physical Exam  Constitutional: She is oriented to person, place, and time. She appears well-developed and well-nourished.  Cardiovascular: Normal rate, regular rhythm and normal heart sounds.   No murmur heard. Pulmonary/Chest: Effort normal and breath sounds normal. She has no wheezes.  Musculoskeletal: She exhibits no edema.  Neurological: She is alert and oriented to person, place, and time.  Psychiatric: She has a normal mood and affect. Her behavior is normal.          Assessment & Plan:

## 2012-04-18 NOTE — Assessment & Plan Note (Signed)
Use alprazolam as needed (sparingly). 

## 2012-04-18 NOTE — Assessment & Plan Note (Signed)
Use lower dose of golytely as needed.

## 2012-04-21 ENCOUNTER — Encounter (INDEPENDENT_AMBULATORY_CARE_PROVIDER_SITE_OTHER): Payer: Medicare Other

## 2012-04-21 DIAGNOSIS — R42 Dizziness and giddiness: Secondary | ICD-10-CM

## 2012-04-21 DIAGNOSIS — I6529 Occlusion and stenosis of unspecified carotid artery: Secondary | ICD-10-CM

## 2012-04-24 ENCOUNTER — Telehealth: Payer: Self-pay | Admitting: Internal Medicine

## 2012-04-24 NOTE — Telephone Encounter (Signed)
[facility] Called and needs clarification on Golytely . Pls call.

## 2012-04-25 NOTE — Telephone Encounter (Signed)
Left message to call back  

## 2012-04-29 ENCOUNTER — Ambulatory Visit (INDEPENDENT_AMBULATORY_CARE_PROVIDER_SITE_OTHER): Payer: Medicare Other | Admitting: Internal Medicine

## 2012-04-29 ENCOUNTER — Encounter: Payer: Self-pay | Admitting: Internal Medicine

## 2012-04-29 VITALS — BP 132/72 | Wt 175.0 lb

## 2012-04-29 DIAGNOSIS — I6529 Occlusion and stenosis of unspecified carotid artery: Secondary | ICD-10-CM | POA: Diagnosis not present

## 2012-04-29 MED ORDER — ATORVASTATIN CALCIUM 40 MG PO TABS: 40.0000 mg | ORAL_TABLET | Freq: Every day | ORAL | Status: DC

## 2012-04-29 NOTE — Progress Notes (Signed)
Subjective:    Patient ID: Christina Mckinney, female    DOB: 12/13/1931, 78 y.o.   MRN: [id number]  HPI  78 year old white female with history of COPD, hypertension and carotid disease for followup. Patient recently had repeat carotid artery Doppler. It showed worsening stenosis. She has a 40-59% in RICA. and 60-79% in LICA.  She has not always been compliant with her simvastatin.  She takes daily aspirin.  She does not have history of TIA or stroke.  Review of Systems Negative for chest pain  Past Medical History  Diagnosis Date  . Depressive disorder, not elsewhere classified   . Diverticulitis   . GERD (gastroesophageal reflux disease)   . Other and unspecified hyperlipidemia   . Unspecified essential hypertension   . Other abnormal glucose   . Unspecified hypothyroidism   . Personal history of colonic polyps   . Anxiety state, unspecified   . Personal history of peptic ulcer disease   . Osteoarthritis   . Gout, unspecified   . Essential and other specified forms of tremor   . Internal hemorrhoids without mention of complication   . Anemia, unspecified   . Unspecified disorder of bladder   . Pulmonary embolism     History   Social History  . Marital Status: Widowed    Spouse Name: N/A    Number of Children: 3  . Years of Education: N/A   Occupational History  . Retired     Social History Main Topics  . Smoking status: Former Games developer  . Smokeless tobacco: Never Used  . Alcohol Use: Yes     occasional wine   . Drug Use: No  . Sexually Active: Not on file   Other Topics Concern  . Not on file   Social History Narrative  . No narrative on file    Past Surgical History  Procedure Date  . Cholecystectomy   . Abdominal hysterectomy   . Nasal septum surgery   . Shoulder surgery   . Knee arthroscopy     right     Family History  Problem Relation Age of Onset  . Colon cancer Neg Hx     Not on File  Current Outpatient Prescriptions on File Prior to  Visit  Medication Sig Dispense Refill  . ALPRAZolam (NIRAVAM) 0.5 MG dissolvable tablet Take 1 tablet (0.5 mg total) by mouth at bedtime as needed.  30 tablet  1  . azelastine (ASTELIN) 137 MCG/SPRAY nasal spray 1 spray by Nasal route daily as needed. Use in each nostril as directed       . beclomethasone (QVAR) 80 MCG/ACT inhaler Inhale 2 puffs into the lungs 2 (two) times daily.  1 Inhaler  3  . Cholecalciferol (VITAMIN D) 2000 UNITS tablet Take 2,000 Units by mouth daily.        Marland Kitchen co-enzyme Q-10 30 MG capsule Take 30 mg by mouth 3 (three) times daily.        Marland Kitchen darifenacin (ENABLEX) 15 MG 24 hr tablet Take 1 tablet (15 mg total) by mouth daily.  90 tablet  1  . diclofenac sodium (VOLTAREN) 1 % GEL Apply 1 application topically 4 (four) times daily. Apply 2 grams qid.  2 Tube  2  . febuxostat (ULORIC) 40 MG tablet Take 1 tablet (40 mg total) by mouth daily.  90 tablet  1  . fluticasone (FLONASE) 50 MCG/ACT nasal spray Place 2 sprays into the nose daily.  16 g  3  . furosemide (LASIX)  40 MG tablet Take 1 tablet (40 mg total) by mouth daily. As directed.  90 tablet  1  . gabapentin (NEURONTIN) 300 MG capsule Take 1 capsule (300 mg total) by mouth daily.  90 capsule  1  . Glucosamine-Chondroitin 500-250 MG CAPS Take 1 capsule by mouth 2 (two) times daily.        Marland Kitchen HYDROcodone-acetaminophen (VICODIN) 5-500 MG per tablet Take 1 tablet by mouth every 6 (six) hours as needed.        . lansoprazole (PREVACID) 30 MG capsule Take 1 capsule (30 mg total) by mouth 2 (two) times daily.  60 capsule  0  . levothyroxine (SYNTHROID, LEVOTHROID) 50 MCG tablet Take 1 tablet (50 mcg total) by mouth daily.  90 tablet  1  . losartan (COZAAR) 100 MG tablet Take 1 tablet (100 mg total) by mouth daily.  90 tablet  1  . metoprolol succinate (TOPROL-XL) 50 MG 24 hr tablet Take 1 tablet (50 mg total) by mouth daily. Take with or immediately following a meal.  90 tablet  3  . Olopatadine HCl (PATADAY) 0.2 % SOLN Apply 1 drop  to eye daily. Each eye.       . tiotropium (SPIRIVA HANDIHALER) 18 MCG inhalation capsule Place 1 capsule (18 mcg total) into inhaler and inhale daily.  30 capsule  3  . atorvastatin (LIPITOR) 40 MG tablet Take 1 tablet (40 mg total) by mouth daily.  30 tablet  0  . DISCONTD: propranolol (INDERAL) 60 MG tablet Take 1 tablet (60 mg total) by mouth 2 (two) times daily.  60 tablet  3    BP 132/72  Wt 175 lb (79.379 kg)       Objective:   Physical Exam  Constitutional: She appears well-developed and well-nourished.  Neck:       No carotid bruit  Cardiovascular: Normal rate, regular rhythm and normal heart sounds.   Pulmonary/Chest: Effort normal and breath sounds normal. She has no wheezes.  Skin: Skin is warm and dry.      Assessment & Plan:

## 2012-04-29 NOTE — Patient Instructions (Addendum)
Please complete the following lab tests before your next follow up appointment: Fasting lipid panel, LFTs- 272.4 

## 2012-04-29 NOTE — Assessment & Plan Note (Signed)
78 year old white female with worsening carotid artery stenosis. Switch simvastatin to atorvastatin.  Goal LDL < 70.  Continue daily aspirin.  Repeat FLP and LFTs in 2 months.    Repeat carotid doppler in 6 months.

## 2012-05-06 ENCOUNTER — Encounter: Payer: Self-pay | Admitting: Vascular Surgery

## 2012-05-07 ENCOUNTER — Encounter: Payer: Self-pay | Admitting: Vascular Surgery

## 2012-05-07 ENCOUNTER — Ambulatory Visit (INDEPENDENT_AMBULATORY_CARE_PROVIDER_SITE_OTHER): Payer: Medicare Other | Admitting: *Deleted

## 2012-05-07 ENCOUNTER — Ambulatory Visit (INDEPENDENT_AMBULATORY_CARE_PROVIDER_SITE_OTHER): Payer: Medicare Other | Admitting: Vascular Surgery

## 2012-05-07 VITALS — BP 111/75 | HR 67 | Temp 98.5°F | Ht 61.0 in | Wt 176.0 lb

## 2012-05-07 DIAGNOSIS — I83893 Varicose veins of bilateral lower extremities with other complications: Secondary | ICD-10-CM

## 2012-05-07 DIAGNOSIS — I8310 Varicose veins of unspecified lower extremity with inflammation: Secondary | ICD-10-CM

## 2012-05-07 NOTE — Progress Notes (Signed)
[facility]ist of [facility]  Patient name: Christina Mckinney MRN: [id number] DOB: 04-21-1932 Sex: female  REASON FOR CONSULT: Perclose veins. Referred by Dr. Artist Pais  HPI: Christina Mckinney is a 78 y.o. female he states that she has had a long history of varicose veins. Her symptoms have been more significant on the left side where it she experiences aching pain burning and cramps. He symptoms are aggravated by standing and relieved somewhat with elevation. Of note she had a DVT in her left lower extremity and a pulmonary embolus back in September of 2010 and was on Coumadin for some time. She is no longer on Coumadin.  She feels her symptoms in her left leg especially have gradually gotten worse and she wishes to consider further treatment. She states her mother had problems with varicose veins. She's never had any bleeding problems associated with her varicosities. She has had some bilateral lower extremity swelling.  Past Medical History  Diagnosis Date  . Depressive disorder, not elsewhere classified   . Diverticulitis   . GERD (gastroesophageal reflux disease)   . Other and unspecified hyperlipidemia   . Unspecified essential hypertension   . Other abnormal glucose   . Unspecified hypothyroidism   . Personal history of colonic polyps   . Anxiety state, unspecified   . Personal history of peptic ulcer disease   . Osteoarthritis   . Gout, unspecified   . Essential and other specified forms of tremor   . Internal hemorrhoids without mention of complication   . Anemia, unspecified   . Unspecified disorder of bladder   . Pulmonary embolism   . DVT (deep venous thrombosis)     Family History  Problem Relation Age of Onset  . Colon cancer Neg Hx   . Other Mother     varicose veins  . Heart disease Father   . Hyperlipidemia Father   . Heart attack Father   . Diabetes Daughter   . Heart disease Daughter   . Hyperlipidemia Daughter   . Hypertension Daughter     SOCIAL  HISTORY: History  Substance Use Topics  . Smoking status: Former Smoker    Types: Cigarettes    Quit date: 11/12/1988  . Smokeless tobacco: Never Used  . Alcohol Use: 2.0 oz/week    4 drink(s) per week     occasional wine     No Known Allergies  Current Outpatient Prescriptions  Medication Sig Dispense Refill  . ALPRAZolam (NIRAVAM) 0.5 MG dissolvable tablet Take 1 tablet (0.5 mg total) by mouth at bedtime as needed.  30 tablet  1  . aspirin 81 MG tablet Take 1 tablet (81 mg total) by mouth daily.  30 tablet    . atorvastatin (LIPITOR) 40 MG tablet Take 1 tablet (40 mg total) by mouth daily.  30 tablet  0  . azelastine (ASTELIN) 137 MCG/SPRAY nasal spray 1 spray by Nasal route daily as needed. Use in each nostril as directed       . beclomethasone (QVAR) 80 MCG/ACT inhaler Inhale 2 puffs into the lungs 2 (two) times daily.  1 Inhaler  3  . Cholecalciferol (VITAMIN D) 2000 UNITS tablet Take 2,000 Units by mouth daily.        Marland Kitchen co-enzyme Q-10 30 MG capsule Take 30 mg by mouth 3 (three) times daily.        Marland Kitchen darifenacin (ENABLEX) 15 MG 24 hr tablet Take 1 tablet (15 mg total) by mouth daily.  90 tablet  1  . diclofenac sodium (VOLTAREN) 1 % GEL Apply 1 application topically 4 (four) times daily. Apply 2 grams qid.  2 Tube  2  . febuxostat (ULORIC) 40 MG tablet Take 1 tablet (40 mg total) by mouth daily.  90 tablet  1  . fluticasone (FLONASE) 50 MCG/ACT nasal spray Place 2 sprays into the nose daily.  16 g  3  . furosemide (LASIX) 40 MG tablet Take 1 tablet (40 mg total) by mouth daily. As directed.  90 tablet  1  . gabapentin (NEURONTIN) 300 MG capsule Take 1 capsule (300 mg total) by mouth daily.  90 capsule  1  . Glucosamine-Chondroitin 500-250 MG CAPS Take 1 capsule by mouth 2 (two) times daily.        Marland Kitchen HYDROcodone-acetaminophen (VICODIN) 5-500 MG per tablet Take 1 tablet by mouth every 6 (six) hours as needed.        . lansoprazole (PREVACID) 30 MG capsule Take 1 capsule (30 mg  total) by mouth 2 (two) times daily.  60 capsule  0  . levothyroxine (SYNTHROID, LEVOTHROID) 50 MCG tablet Take 1 tablet (50 mcg total) by mouth daily.  90 tablet  1  . losartan (COZAAR) 100 MG tablet Take 1 tablet (100 mg total) by mouth daily.  90 tablet  1  . metoprolol succinate (TOPROL-XL) 50 MG 24 hr tablet Take 1 tablet (50 mg total) by mouth daily. Take with or immediately following a meal.  90 tablet  3  . Olopatadine HCl (PATADAY) 0.2 % SOLN Apply 1 drop to eye daily. Each eye.       . tiotropium (SPIRIVA HANDIHALER) 18 MCG inhalation capsule Place 1 capsule (18 mcg total) into inhaler and inhale daily.  30 capsule  3  . DISCONTD: propranolol (INDERAL) 60 MG tablet Take 1 tablet (60 mg total) by mouth 2 (two) times daily.  60 tablet  3    REVIEW OF SYSTEMS: Arly.Keller ] denotes positive finding; [  ] denotes negative finding  CARDIOVASCULAR:  [ ]  chest pain   [ ]  chest pressure   [ ]  palpitations   [ ]  orthopnea   [ ]  dyspnea on exertion   [ ]  claudication   [ ]  rest pain   [ ]  DVT   [ ]  phlebitis PULMONARY:   [ ]  productive cough   Arly.Keller ] asthma   Arly.Keller ] wheezing NEUROLOGIC:   Arly.Keller ] weakness  [ ]  paresthesias  [ ]  aphasia  [ ]  amaurosis  Arly.Keller ] dizziness HEMATOLOGIC:   [ ]  bleeding problems   [ ]  clotting disorders MUSCULOSKELETAL:  [ ]  joint pain   [ ]  joint swelling [ ]  leg swelling GASTROINTESTINAL: [ ]   blood in stool  [ ]   hematemesis GENITOURINARY:  [ ]   dysuria  [ ]   hematuria PSYCHIATRIC:  Arly.Keller ] history of major depression INTEGUMENTARY:  [ ]  rashes  [ ]  ulcers CONSTITUTIONAL:  [ ]  fever   [ ]  chills  PHYSICAL EXAM: Filed Vitals:   05/07/12 1240  BP: 111/75  Pulse: 67  Temp: 98.5 F (36.9 C)  TempSrc: Oral  Height: 5\' 1"  (1.549 m)  Weight: 176 lb (79.833 kg)  SpO2: 98%   Body mass index is 33.25 kg/(m^2). GENERAL: The patient is a well-nourished female, in no acute distress. The vital signs are documented above. CARDIOVASCULAR: There is a regular rate and rhythm without  significant murmur appreciated. I do not detect carotid bruits. She has palpable dorsalis  pedis and posterior tibial pulses bilaterally. He has mild bilateral lower extremity swelling. PULMONARY: There is good air exchange bilaterally without wheezing or rales. ABDOMEN: Soft and non-tender with normal pitched bowel sounds.  MUSCULOSKELETAL: There are no major deformities or cyanosis. NEUROLOGIC: No focal weakness or paresthesias are detected. SKIN: She has telangiectasias and spider veins bilaterally. She has a larger cluster of varicose veins on the anterior lateral aspect of her left leg. PSYCHIATRIC: The patient has a normal affect.  DATA:  Lab Results  Component Value Date   WBC 8.8 04/15/2012   HGB 12.6 04/15/2012   HCT 37.9 04/15/2012   MCV 97.8 04/15/2012   PLT 211.0 04/15/2012   Lab Results  Component Value Date   CREATININE 1.1 04/15/2012    Lab Results  Component Value Date   HGBA1C 5.8 01/14/2012   I have independently interpreted her venous duplex scan today. This shows no evidence of DVT in either lower extremity. She has reflux in her left rate her saphenous vein in the proximal thigh. She also has some reflux in the right common femoral vein and superficial femoral vein in addition to the left common femoral vein.  I have reviewed her records from Dr. Olegario Messier office. She has a history of COPD which is been stable and her current medications.  MEDICAL ISSUES:  VARICOSE VEINS: This patient has symptomatic their close veins of both lower extremities but more significantly on the left side. I've explained she does have reflux in her left greater saphenous vein in the proximal thigh. This certainly could be can treating to the elevated venous pressure associated with her varicose veins on the anterior lateral left leg. We have discussed the importance of intermittent leg elevation and the importance of wearing compression stockings. She does have a pair of pantyhose style compression  stockings but does not know the exact gradient. She states that they are fairly tight. She will check to see what gradient she has been I have written her a prescription for a thigh high compression stocking with a 20-30 mm of mercury pressure gradient. I've also discussed the importance of avoiding prolonged sitting and standing. I'll have her follow up in 3 months with Dr. Arbie Cookey or Dr. Hart Rochester to consider laser ablation of her left greater saphenous vein if her symptoms have not improved.  BILATERAL CAROTID STENOSES: This patient does have a 40-59% right carotid stenosis and a 60-79% left carotid stenosis by previous duplex scan as per Dr. Olegario Messier notes. Given that she is asymptomatic I explained we would not consider left carotid endarterectomy unless the stenosis progressed to greater than 80%. She is on aspirin. Dr. Artist Pais has been getting her routine carotid duplex scans and following this.   Tersea Aulds S [facility] [place] [facility]: [phone]

## 2012-05-19 ENCOUNTER — Encounter: Payer: Medicare Other | Admitting: Surgery

## 2012-05-21 NOTE — Procedures (Unsigned)
LOWER EXTREMITY VENOUS REFLUX EXAM  INDICATION:  Varicose veins with inflammation.  EXAM:  Using color-flow imaging and pulse Doppler spectral analysis, the bilateral common femoral, femoral, popliteal, posterior tibial, great and small saphenous veins were evaluated.  There is evidence suggesting deep venous Reflux of >577milliseconds noted in the bilateral common femoral and right superficial femoral veins.  The bilateral saphenofemoral junctions demonstrate Reflux of >562milliseconds.  The right great saphenous vein is competent.  The left non-tortuous great saphenous vein demonstrates Reflux of >557milliseconds.  The bilateral proximal small saphenous vein demonstrate competency.  GSV Diameter (used if found to be incompetent only)                                                            Right Left Proximal Greater Saphenous Vein                            cm 0.57 cm Proximal-to-mid-thigh                                      cm cm Mid thigh                                                  cm 0.49 cm Mid-distal thigh                                           cm cm Distal thigh                                               cm 0.36 cm Knee                                                       cm 0.22 cm  IMPRESSION:  Left great saphenous vein and bilateral deep venous system Reflux is noted, as described above.  ___________________________________________ Di Kindle. Edilia Bo, M.D.  CH/MEDQ  D:  05/09/2012  T:  05/09/2012  Job:  [id number]

## 2012-05-23 ENCOUNTER — Other Ambulatory Visit: Payer: Self-pay | Admitting: Internal Medicine

## 2012-06-12 ENCOUNTER — Telehealth: Payer: Self-pay | Admitting: Internal Medicine

## 2012-06-12 MED ORDER — LEVOTHYROXINE SODIUM 50 MCG PO TABS: 50.0000 ug | ORAL_TABLET | Freq: Every day | ORAL | Status: DC

## 2012-06-12 NOTE — Telephone Encounter (Signed)
rx sent in electronically 

## 2012-06-12 NOTE — Telephone Encounter (Signed)
Pt stats that she is out of her thyroid medication for a few days now and is feeling funny and is requesting to get a refill on    levothyroxine (SYNTHROID, LEVOTHROID) 50 MCG tablet    ASAP, please call into [facility] [address]

## 2012-06-16 ENCOUNTER — Telehealth: Payer: Self-pay | Admitting: Internal Medicine

## 2012-06-16 NOTE — Telephone Encounter (Signed)
Patient also states that she need a [facility]ill of [facility]

## 2012-06-16 NOTE — Telephone Encounter (Signed)
Patient called stating that she need a years refills of her levothyroxine 5mg , furosemid, gabapentin [facility]. Please assist.

## 2012-06-17 NOTE — Telephone Encounter (Signed)
Ok for refills.  Refill alprazolam 0.25 mg one po bid prn.  #60 with 5 refills. Or see if they will fill # 180 with 1 RF

## 2012-06-17 NOTE — Telephone Encounter (Signed)
Rx called in as instructed. It was for #180 with 1refill

## 2012-06-23 ENCOUNTER — Other Ambulatory Visit (INDEPENDENT_AMBULATORY_CARE_PROVIDER_SITE_OTHER): Payer: Medicare Other

## 2012-06-23 DIAGNOSIS — E785 Hyperlipidemia, unspecified: Secondary | ICD-10-CM

## 2012-06-23 LAB — LIPID PANEL
Cholesterol: 142 mg/dL (ref 0–200)
LDL Cholesterol: 70 mg/dL (ref 0–99)
Triglycerides: 88 mg/dL (ref 0.0–149.0)
VLDL: 17.6 mg/dL (ref 0.0–40.0)

## 2012-06-23 LAB — HEPATIC FUNCTION PANEL
Albumin: 4 g/dL (ref 3.5–5.2)
Alkaline Phosphatase: 67 U/L (ref 39–117)
Total Protein: 6.9 g/dL (ref 6.0–8.3)

## 2012-06-26 ENCOUNTER — Telehealth: Payer: Self-pay | Admitting: Internal Medicine

## 2012-06-26 MED ORDER — FUROSEMIDE 40 MG PO TABS: 40.0000 mg | ORAL_TABLET | Freq: Every day | ORAL | Status: DC

## 2012-06-26 NOTE — Telephone Encounter (Signed)
Pt requesting refill on furosemide (LASIX) 40 MG tablet   [facility] [address]

## 2012-06-26 NOTE — Telephone Encounter (Signed)
rx sent in electronically 

## 2012-06-30 ENCOUNTER — Ambulatory Visit (INDEPENDENT_AMBULATORY_CARE_PROVIDER_SITE_OTHER): Payer: Medicare Other | Admitting: Internal Medicine

## 2012-06-30 ENCOUNTER — Encounter: Payer: Self-pay | Admitting: Internal Medicine

## 2012-06-30 VITALS — BP 132/70 | HR 76 | Temp 98.6°F

## 2012-06-30 DIAGNOSIS — F411 Generalized anxiety disorder: Secondary | ICD-10-CM

## 2012-06-30 DIAGNOSIS — E785 Hyperlipidemia, unspecified: Secondary | ICD-10-CM | POA: Diagnosis not present

## 2012-06-30 DIAGNOSIS — G2581 Restless legs syndrome: Secondary | ICD-10-CM

## 2012-06-30 MED ORDER — ROSUVASTATIN CALCIUM 10 MG PO TABS: 10.0000 mg | ORAL_TABLET | Freq: Every day | ORAL | Status: DC

## 2012-06-30 MED ORDER — ROPINIROLE HCL 1 MG PO TABS: 1.0000 mg | ORAL_TABLET | Freq: Every day | ORAL | Status: DC

## 2012-06-30 NOTE — Assessment & Plan Note (Signed)
Patient discontinued citalopram.  She notes increase in life stressors.  She is short fused.  Consider restart SSRI.

## 2012-06-30 NOTE — Assessment & Plan Note (Signed)
Patient restarted Requip 1 mg at bedtime.

## 2012-06-30 NOTE — Progress Notes (Signed)
Subjective:    Patient ID: Christina Mckinney, female    DOB: May 12, 1932, 78 y.o.   MRN: [id number]  HPI  78 year old white female with history of carotid artery stenosis and hyperlipidemia for followup. At previous visit patient was switched from simvastatin to atorvastatin. Patient has not been able to continue Lipitor due to increase in muscle aches especially around her hips and low back.  Patient also has history of restless leg syndrome. She had old prescription of Requip 1 mg by mouth each bedtime. Patient reports restarting this medication. However current medication has expired.  Review of Systems No chest Mckinney  Past Medical History  Diagnosis Date  . Depressive disorder, not elsewhere classified   . Diverticulitis   . GERD (gastroesophageal reflux disease)   . Other and unspecified hyperlipidemia   . Unspecified essential hypertension   . Other abnormal glucose   . Unspecified hypothyroidism   . Personal history of colonic polyps   . Anxiety state, unspecified   . Personal history of peptic ulcer disease   . Osteoarthritis   . Gout, unspecified   . Essential and other specified forms of tremor   . Internal hemorrhoids without mention of complication   . Anemia, unspecified   . Unspecified disorder of bladder   . Pulmonary embolism   . DVT (deep venous thrombosis)     History   Social History  . Marital Status: Widowed    Spouse Name: N/A    Number of Children: 3  . Years of Education: N/A   Occupational History  . Retired     Social History Main Topics  . Smoking status: Former Smoker    Types: Cigarettes    Quit date: 11/12/1988  . Smokeless tobacco: Never Used  . Alcohol Use: 2.0 oz/week    4 drink(s) per week     occasional wine   . Drug Use: No  . Sexually Active: Not on file   Other Topics Concern  . Not on file   Social History Narrative  . No narrative on file    Past Surgical History  Procedure Date  . Cholecystectomy   . Abdominal  hysterectomy   . Nasal septum surgery   . Shoulder surgery   . Knee arthroscopy     right   . Eye surgery     Family History  Problem Relation Age of Onset  . Colon cancer Neg Hx   . Other Mother     varicose veins  . Heart disease Father   . Hyperlipidemia Father   . Heart attack Father   . Diabetes Daughter   . Heart disease Daughter   . Hyperlipidemia Daughter   . Hypertension Daughter     No Known Allergies  Current Outpatient Prescriptions on File Prior to Visit  Medication Sig Dispense Refill  . aspirin 81 MG tablet Take 1 tablet (81 mg total) by mouth daily.  30 tablet    . azelastine (ASTELIN) 137 MCG/SPRAY nasal spray 1 spray by Nasal route daily as needed. Use in each nostril as directed       . beclomethasone (QVAR) 80 MCG/ACT inhaler Inhale 2 puffs into the lungs 2 (two) times daily.  1 Inhaler  3  . Cholecalciferol (VITAMIN D) 2000 UNITS tablet Take 2,000 Units by mouth daily.        Marland Kitchen co-enzyme Q-10 30 MG capsule Take 30 mg by mouth 3 (three) times daily.        Marland Kitchen darifenacin (ENABLEX)  15 MG 24 hr tablet Take 1 tablet (15 mg total) by mouth daily.  90 tablet  1  . diclofenac sodium (VOLTAREN) 1 % GEL Apply 1 application topically 4 (four) times daily. Apply 2 grams qid.  2 Tube  2  . febuxostat (ULORIC) 40 MG tablet Take 1 tablet (40 mg total) by mouth daily.  90 tablet  1  . fluticasone (FLONASE) 50 MCG/ACT nasal spray Place 2 sprays into the nose daily.  16 g  3  . furosemide (LASIX) 40 MG tablet Take 1 tablet (40 mg total) by mouth daily. As directed.  90 tablet  1  . gabapentin (NEURONTIN) 300 MG capsule Take 1 capsule (300 mg total) by mouth daily.  90 capsule  1  . Glucosamine-Chondroitin 500-250 MG CAPS Take 1 capsule by mouth 2 (two) times daily.        Marland Kitchen HYDROcodone-acetaminophen (VICODIN) 5-500 MG per tablet Take 1 tablet by mouth every 6 (six) hours as needed.        . lansoprazole (PREVACID) 30 MG capsule Take 1 capsule (30 mg total) by mouth 2 (two)  times daily.  60 capsule  0  . levothyroxine (SYNTHROID, LEVOTHROID) 50 MCG tablet Take 1 tablet (50 mcg total) by mouth daily.  90 tablet  1  . losartan (COZAAR) 100 MG tablet Take 1 tablet (100 mg total) by mouth daily.  90 tablet  1  . metoprolol succinate (TOPROL-XL) 50 MG 24 hr tablet Take 1 tablet (50 mg total) by mouth daily. Take with or immediately following a meal.  90 tablet  3  . Olopatadine HCl (PATADAY) 0.2 % SOLN Apply 1 drop to eye daily. Each eye.       . tiotropium (SPIRIVA HANDIHALER) 18 MCG inhalation capsule Place 1 capsule (18 mcg total) into inhaler and inhale daily.  30 capsule  3  . rOPINIRole (REQUIP) 1 MG tablet Take 1 tablet (1 mg total) by mouth at bedtime.  90 tablet  1  . rosuvastatin (CRESTOR) 10 MG tablet Take 1 tablet (10 mg total) by mouth daily.  90 tablet  1  . DISCONTD: propranolol (INDERAL) 60 MG tablet Take 1 tablet (60 mg total) by mouth 2 (two) times daily.  60 tablet  3    BP 132/70  Pulse 76  Temp 98.6 F (37 C) (Oral)       Objective:   Physical Exam  Constitutional: She is oriented to person, place, and time. She appears well-developed and well-nourished.  Cardiovascular: Normal rate, regular rhythm and normal heart sounds.   Pulmonary/Chest: Effort normal and breath sounds normal. She has no wheezes.  Musculoskeletal: She exhibits no edema.  Neurological: She is alert and oriented to person, place, and time.  Psychiatric: She has a normal mood and affect. Her behavior is normal.          Assessment & Plan:

## 2012-06-30 NOTE — Patient Instructions (Addendum)
Please complete the following lab tests before your next follow up appointment: FLP, LFTs, CPK - 272.4

## 2012-06-30 NOTE — Assessment & Plan Note (Signed)
LDL improved with diet, exercise and atorvastatin. However patient discontinued atorvastatin due to musculoskeletal complaints. Switch to Crestor 10 mg once daily.  LFTs, CPK and lipid panel before next office visit.

## 2012-07-16 ENCOUNTER — Other Ambulatory Visit: Payer: Self-pay | Admitting: Internal Medicine

## 2012-07-22 ENCOUNTER — Telehealth: Payer: Self-pay | Admitting: Family Medicine

## 2012-07-22 NOTE — Telephone Encounter (Signed)
I received a voice mail from [facility] stating that this pt's rx for Enalbex is denied. Per Mardella Layman at [employer], Enablex 15mg  is not covered. Please advise.

## 2012-07-22 NOTE — Telephone Encounter (Signed)
Please call pt or her ins co and find out which overactive bladder medication is preferred

## 2012-07-23 MED ORDER — OXYBUTYNIN CHLORIDE ER 10 MG PO TB24: 10.0000 mg | ORAL_TABLET | Freq: Every day | ORAL | Status: DC

## 2012-07-23 NOTE — Telephone Encounter (Signed)
rx sent in electronically 

## 2012-07-23 NOTE — Telephone Encounter (Signed)
Cindy, please change rx to oxybutynin chloride ER 10 mg #90.  One tab po qd RF x 1

## 2012-07-23 NOTE — Telephone Encounter (Signed)
Dr. Riki Altes,  I checked on her formulary online. Below are the lowest tier meds that are in the same class as Enablex.   Tier 1 OXYBUTYNIN CHLORIDE ER 24HR TAB (5MG ) - limit of 90 per 90 OXYBUTYNIN CHLORIDE ER 24HR TAB (10 or 15mg ) - limit 180 per 90 OXYBUTYNIN CHLORIDE tabs - limit 360 per 90   Tier 2 TAMSULOSIN HCL - 180 per 90  ALFUZOSIN HCL ER - limit 90 per 90 FINASTERIDE 5mg  - limit 90 per 90 FLAVOXATE HCL - no QTY limit listed

## 2012-07-23 NOTE — Telephone Encounter (Signed)
Cindy, don't call pt yet. I may be able to go online and get the preferred med for you - will let you know today. Thanks!

## 2012-07-30 ENCOUNTER — Encounter: Payer: Self-pay | Admitting: Internal Medicine

## 2012-07-30 ENCOUNTER — Ambulatory Visit (INDEPENDENT_AMBULATORY_CARE_PROVIDER_SITE_OTHER): Payer: Medicare Other | Admitting: Internal Medicine

## 2012-07-30 VITALS — BP 124/68 | Temp 98.2°F

## 2012-07-30 DIAGNOSIS — M7072 Other bursitis of hip, left hip: Secondary | ICD-10-CM

## 2012-07-30 DIAGNOSIS — M76899 Other specified enthesopathies of unspecified lower limb, excluding foot: Secondary | ICD-10-CM

## 2012-07-30 MED ORDER — METHOCARBAMOL 500 MG PO TABS: 500.0000 mg | ORAL_TABLET | Freq: Two times a day (BID) | ORAL | Status: DC | PRN

## 2012-07-30 NOTE — Patient Instructions (Signed)
Use cool compress to left hip as directed 2-3 times per day

## 2012-07-31 ENCOUNTER — Telehealth: Payer: Self-pay | Admitting: Internal Medicine

## 2012-07-31 DIAGNOSIS — M7072 Other bursitis of hip, left hip: Secondary | ICD-10-CM | POA: Insufficient documentation

## 2012-07-31 NOTE — Assessment & Plan Note (Addendum)
78 y/o female with signs and symptoms of left hip bursitis.  Area prepped with betadine and alcohol pad.  Left hip bursa injected with 1:1 mixture of 40 mg of depomedrol and 1% lidocaine.  Sterile technique maintained.  No complications.  Patient advised to rest and apply cool compress 2-3 times per day.  Reassess in 2 weeks.  Patient advised to call office if symptoms persist or worsen.

## 2012-07-31 NOTE — Progress Notes (Signed)
Subjective:    Patient ID: Christina Mckinney, female    DOB: 07-Sep-1932, 78 y.o.   MRN: [id number]  HPI  78 y/o white female complains of left hip pain for last 10 days.  No hx of injury or trauma.  She attributed her symptoms to Crestor and stopped her medication.  No change in symptoms.  It is painful especially when she lays on her left side.  Review of Systems No fever or chills  Past Medical History  Diagnosis Date  . Depressive disorder, not elsewhere classified   . Diverticulitis   . GERD (gastroesophageal reflux disease)   . Other and unspecified hyperlipidemia   . Unspecified essential hypertension   . Other abnormal glucose   . Unspecified hypothyroidism   . Personal history of colonic polyps   . Anxiety state, unspecified   . Personal history of peptic ulcer disease   . Osteoarthritis   . Gout, unspecified   . Essential and other specified forms of tremor   . Internal hemorrhoids without mention of complication   . Anemia, unspecified   . Unspecified disorder of bladder   . Pulmonary embolism   . DVT (deep venous thrombosis)     History   Social History  . Marital Status: Widowed    Spouse Name: N/A    Number of Children: 3  . Years of Education: N/A   Occupational History  . Retired     Social History Main Topics  . Smoking status: Former Smoker    Types: Cigarettes    Quit date: 11/12/1988  . Smokeless tobacco: Never Used  . Alcohol Use: 2.0 oz/week    4 drink(s) per week     occasional wine   . Drug Use: No  . Sexually Active: Not on file   Other Topics Concern  . Not on file   Social History Narrative  . No narrative on file    Past Surgical History  Procedure Date  . Cholecystectomy   . Abdominal hysterectomy   . Nasal septum surgery   . Shoulder surgery   . Knee arthroscopy     right   . Eye surgery     Family History  Problem Relation Age of Onset  . Colon cancer Neg Hx   . Other Mother     varicose veins  . Heart disease  Father   . Hyperlipidemia Father   . Heart attack Father   . Diabetes Daughter   . Heart disease Daughter   . Hyperlipidemia Daughter   . Hypertension Daughter     No Known Allergies  Current Outpatient Prescriptions on File Prior to Visit  Medication Sig Dispense Refill  . aspirin 81 MG tablet Take 1 tablet (81 mg total) by mouth daily.  30 tablet    . azelastine (ASTELIN) 137 MCG/SPRAY nasal spray 1 spray by Nasal route daily as needed. Use in each nostril as directed       . Cholecalciferol (VITAMIN D) 2000 UNITS tablet Take 2,000 Units by mouth daily.        . citalopram (CELEXA) 20 MG tablet TAKE 1 TABLET (20MG  TOTAL ) BY MOUTH DAILY  90 tablet  0  . co-enzyme Q-10 30 MG capsule Take 30 mg by mouth 3 (three) times daily.        . diclofenac sodium (VOLTAREN) 1 % GEL Apply 1 application topically 4 (four) times daily. Apply 2 grams qid.  2 Tube  2  . ENABLEX 15 MG 24  hr tablet TAKE 1 TABLET BY MOUTH DAILY  90 tablet  0  . febuxostat (ULORIC) 40 MG tablet Take 1 tablet (40 mg total) by mouth daily.  90 tablet  1  . fluticasone (FLONASE) 50 MCG/ACT nasal spray USE 2 SPRAYS NASALLY DAILY  16 g  5  . furosemide (LASIX) 40 MG tablet Take 1 tablet (40 mg total) by mouth daily. As directed.  90 tablet  1  . gabapentin (NEURONTIN) 300 MG capsule Take 1 capsule (300 mg total) by mouth daily.  90 capsule  1  . Glucosamine-Chondroitin 500-250 MG CAPS Take 1 capsule by mouth 2 (two) times daily.        Marland Kitchen HYDROcodone-acetaminophen (VICODIN) 5-500 MG per tablet Take 1 tablet by mouth every 6 (six) hours as needed.        . lansoprazole (PREVACID) 30 MG capsule Take 1 capsule (30 mg total) by mouth 2 (two) times daily.  60 capsule  0  . levothyroxine (SYNTHROID, LEVOTHROID) 50 MCG tablet Take 1 tablet (50 mcg total) by mouth daily.  90 tablet  1  . losartan (COZAAR) 100 MG tablet Take 1 tablet (100 mg total) by mouth daily.  90 tablet  1  . metoprolol succinate (TOPROL-XL) 50 MG 24 hr tablet Take 1  tablet (50 mg total) by mouth daily. Take with or immediately following a meal.  90 tablet  3  . Olopatadine HCl (PATADAY) 0.2 % SOLN Apply 1 drop to eye daily. Each eye.       Marland Kitchen oxybutynin (DITROPAN XL) 10 MG 24 hr tablet Take 1 tablet (10 mg total) by mouth daily.  90 tablet  1  . QVAR 80 MCG/ACT inhaler INHALE 2 PUFFS INTO THE LUNGS 2 TIMES A DAY  3 g  2  . rOPINIRole (REQUIP) 1 MG tablet Take 1 tablet (1 mg total) by mouth at bedtime.  90 tablet  1  . tiotropium (SPIRIVA HANDIHALER) 18 MCG inhalation capsule Place 1 capsule (18 mcg total) into inhaler and inhale daily.  30 capsule  3  . rosuvastatin (CRESTOR) 10 MG tablet Take 1 tablet (10 mg total) by mouth daily.  90 tablet  1  . DISCONTD: propranolol (INDERAL) 60 MG tablet Take 1 tablet (60 mg total) by mouth 2 (two) times daily.  60 tablet  3    BP 124/68  Temp 98.2 F (36.8 C) (Oral)       Objective:   Physical Exam  Constitutional: She is oriented to person, place, and time. She appears well-developed and well-nourished.  Cardiovascular: Normal rate, regular rhythm and normal heart sounds.   Pulmonary/Chest: Effort normal and breath sounds normal. She has no wheezes.  Musculoskeletal:       Left hip tenderness near trochanter.  Neurological: She is alert and oriented to person, place, and time. No cranial nerve deficit.          Assessment & Plan:

## 2012-07-31 NOTE — Telephone Encounter (Signed)
Caller: Christina Mckinney/Patient; Patient Name: Christina Mckinney; PCP: Artist Pais, Doe-Hyun [name]) (Adults only); Best Callback Phone Number: [phone]. Caller reports she was seen in the office yesterday for pain in her left hip. She was given an injection and MD was to call in a muscle relaxant. She checked with [facility] and [facility] and Rx was not received by either pharmacy. RN reviewed EPIC record and Robaxin was sent to [facility] in [place]. Caller states she does not use [facility] and is not sure why Rx was sent there. She would like medication called to [facility] on [address] [phone].  From EPIC Chart: Methocarbamol (ROBAXIN) 500 MG tablet 30 tablet 0 07/30/2012    Take 1 tablet (500 mg total) by mouth 2 (two) times daily as needed. - Oral   Rx to Kimmer, RPh at [facility]. Caller advised of same.  RN unable to cancel Rx for [facility], no phone # listed in chart.

## 2012-08-04 ENCOUNTER — Encounter: Payer: Self-pay | Admitting: Vascular Surgery

## 2012-08-05 ENCOUNTER — Encounter: Payer: Self-pay | Admitting: Vascular Surgery

## 2012-08-05 ENCOUNTER — Ambulatory Visit (INDEPENDENT_AMBULATORY_CARE_PROVIDER_SITE_OTHER): Payer: Medicare Other | Admitting: Vascular Surgery

## 2012-08-05 VITALS — BP 141/70 | HR 83 | Resp 18 | Ht 61.0 in | Wt 171.8 lb

## 2012-08-05 DIAGNOSIS — I83893 Varicose veins of bilateral lower extremities with other complications: Secondary | ICD-10-CM | POA: Diagnosis not present

## 2012-08-05 NOTE — Progress Notes (Signed)
Problems with Activities of Daily Living Secondary to Leg Pain  1. Mrs. Oglesby states she has difficulty exercising and dancing due to leg pain.  2. Mrs. Lahr has difficulty with cooking, cleaning, or shopping (any activities that require prolonged standing) due to leg pain.     Failure of  Conservative Therapy:  1. Worn 20-30 mm Hg thigh high compression hose >3 months with no relief of symptoms.  2. Frequently elevates legs-no relief of symptoms  3. Taken Ibuprofen 600 Mg TID with no relief of symptoms.  The patient presents today for continued discussion regarding her left leg venous hypertension. She does have pain associated with prolonged standing this is both in her medial thigh and also over her distal lateral calf. She does have marked telangiectasia there quite, and over this area of pain. Prior formal venous duplex was reviewed from June 2013 showing reflux in her left great saphenous vein. I reimaged this area with SonoSite ultrasound as well. I do feel that she has failed conservative treatment and have recommended laser ablation of her great saphenous vein and also sclerotherapy of the telangiectasia over her lateral calf. She understands procedures outpatient under local anesthesia and the slight risk of DVT associated with this. She does have a history of DVT in the past is not currently on Coumadin.

## 2012-08-06 ENCOUNTER — Other Ambulatory Visit: Payer: Self-pay | Admitting: *Deleted

## 2012-08-06 DIAGNOSIS — I83893 Varicose veins of bilateral lower extremities with other complications: Secondary | ICD-10-CM

## 2012-08-11 ENCOUNTER — Ambulatory Visit (INDEPENDENT_AMBULATORY_CARE_PROVIDER_SITE_OTHER): Payer: Medicare Other | Admitting: Internal Medicine

## 2012-08-11 ENCOUNTER — Encounter: Payer: Self-pay | Admitting: Internal Medicine

## 2012-08-11 VITALS — BP 140/92 | Temp 97.9°F | Wt 171.0 lb

## 2012-08-11 DIAGNOSIS — M7072 Other bursitis of hip, left hip: Secondary | ICD-10-CM

## 2012-08-11 DIAGNOSIS — M76899 Other specified enthesopathies of unspecified lower limb, excluding foot: Secondary | ICD-10-CM | POA: Diagnosis not present

## 2012-08-11 DIAGNOSIS — K3189 Other diseases of stomach and duodenum: Secondary | ICD-10-CM

## 2012-08-11 DIAGNOSIS — R1013 Epigastric pain: Secondary | ICD-10-CM

## 2012-08-11 DIAGNOSIS — E785 Hyperlipidemia, unspecified: Secondary | ICD-10-CM | POA: Diagnosis not present

## 2012-08-11 NOTE — Assessment & Plan Note (Signed)
Patient intolerant to atorvastatin and now Crestor due to musculoskeletal complaints. Patient would like to try over-the-counter red yeast rice. Repeat fasting lipid panel and LFTs in 2 months. If LDL is suboptimal we discussed trying Livalo.

## 2012-08-11 NOTE — Assessment & Plan Note (Signed)
78 year old white female with signs and symptoms of intermittent dyspepsia. She is symptomatic despite taking Prevacid 30 mg once daily. She may have component of gastroparesis. Patient advised to try to eat smaller meals for now.

## 2012-08-11 NOTE — Patient Instructions (Addendum)
Please complete the following lab tests before your next follow up appointment: FLP, LFTs - 272.4 BMET - 401.9 Eat smaller meals

## 2012-08-11 NOTE — Assessment & Plan Note (Signed)
Improved after depomedrol injection.  However patient attributes improvement with discontinuing statin.  I agreed to let patient to try using over-the-counter red yeast rice for the next 2 months.

## 2012-08-11 NOTE — Progress Notes (Signed)
Subjective:    Patient ID: Christina Mckinney, female    DOB: Aug 10, 1932, 78 y.o.   MRN: [id number]  HPI  78 year old white female for follow up regarding left hip pain. She was previously seen and diagnosed with left hip bursitis. Her left hip trochanteric bursa was injected with Depo-Medrol. Patient reports a week later, her symptoms improved. However she attributes improvement to discontinuing Crestor.  She would like to try using over-the-counter red yeast rice for next several months.  Patient also complains of burping sour taste intermittently. She has history of GERD. She is taking her Prevacid on a regular basis. Her symptoms are worse with eating large meals. She has had prior cholecystectomy.  Review of Systems No muscle weakness  Past Medical History  Diagnosis Date  . Depressive disorder, not elsewhere classified   . Diverticulitis   . GERD (gastroesophageal reflux disease)   . Other and unspecified hyperlipidemia   . Unspecified essential hypertension   . Other abnormal glucose   . Unspecified hypothyroidism   . Personal history of colonic polyps   . Anxiety state, unspecified   . Personal history of peptic ulcer disease   . Osteoarthritis   . Gout, unspecified   . Essential and other specified forms of tremor   . Internal hemorrhoids without mention of complication   . Anemia, unspecified   . Unspecified disorder of bladder   . Pulmonary embolism   . DVT (deep venous thrombosis)   . Varicose veins     History   Social History  . Marital Status: Widowed    Spouse Name: N/A    Number of Children: 3  . Years of Education: N/A   Occupational History  . Retired     Social History Main Topics  . Smoking status: Former Smoker    Types: Cigarettes    Quit date: 11/12/1988  . Smokeless tobacco: Never Used  . Alcohol Use: 2.0 oz/week    4 drink(s) per week     occasional wine   . Drug Use: No  . Sexually Active: Not on file   Other Topics Concern  . Not on  file   Social History Narrative  . No narrative on file    Past Surgical History  Procedure Date  . Cholecystectomy   . Abdominal hysterectomy   . Nasal septum surgery   . Shoulder surgery   . Knee arthroscopy     right   . Eye surgery     Family History  Problem Relation Age of Onset  . Colon cancer Neg Hx   . Other Mother     varicose veins  . Heart disease Father   . Hyperlipidemia Father   . Heart attack Father   . Diabetes Daughter   . Heart disease Daughter   . Hyperlipidemia Daughter   . Hypertension Daughter     No Known Allergies  Current Outpatient Prescriptions on File Prior to Visit  Medication Sig Dispense Refill  . aspirin 81 MG tablet Take 1 tablet (81 mg total) by mouth daily.  30 tablet    . azelastine (ASTELIN) 137 MCG/SPRAY nasal spray 1 spray by Nasal route daily as needed. Use in each nostril as directed       . Cholecalciferol (VITAMIN D) 2000 UNITS tablet Take 2,000 Units by mouth daily.        Marland Kitchen co-enzyme Q-10 30 MG capsule Take 30 mg by mouth 3 (three) times daily.        Marland Kitchen  diclofenac sodium (VOLTAREN) 1 % GEL Apply 1 application topically 4 (four) times daily. Apply 2 grams qid.  2 Tube  2  . ENABLEX 15 MG 24 hr tablet TAKE 1 TABLET BY MOUTH DAILY  90 tablet  0  . febuxostat (ULORIC) 40 MG tablet Take 1 tablet (40 mg total) by mouth daily.  90 tablet  1  . fluticasone (FLONASE) 50 MCG/ACT nasal spray USE 2 SPRAYS NASALLY DAILY  16 g  5  . furosemide (LASIX) 40 MG tablet Take 1 tablet (40 mg total) by mouth daily. As directed.  90 tablet  1  . gabapentin (NEURONTIN) 300 MG capsule Take 1 capsule (300 mg total) by mouth daily.  90 capsule  1  . Glucosamine-Chondroitin 500-250 MG CAPS Take 1 capsule by mouth 2 (two) times daily.        Marland Kitchen HYDROcodone-acetaminophen (VICODIN) 5-500 MG per tablet Take 1 tablet by mouth every 6 (six) hours as needed.        . lansoprazole (PREVACID) 30 MG capsule Take 1 capsule (30 mg total) by mouth 2 (two) times  daily.  60 capsule  0  . levothyroxine (SYNTHROID, LEVOTHROID) 50 MCG tablet Take 1 tablet (50 mcg total) by mouth daily.  90 tablet  1  . losartan (COZAAR) 100 MG tablet Take 1 tablet (100 mg total) by mouth daily.  90 tablet  1  . methocarbamol (ROBAXIN) 500 MG tablet Take 1 tablet (500 mg total) by mouth 2 (two) times daily as needed.  30 tablet  0  . metoprolol succinate (TOPROL-XL) 50 MG 24 hr tablet Take 1 tablet (50 mg total) by mouth daily. Take with or immediately following a meal.  90 tablet  3  . Olopatadine HCl (PATADAY) 0.2 % SOLN Apply 1 drop to eye daily. Each eye.       Marland Kitchen oxybutynin (DITROPAN XL) 10 MG 24 hr tablet Take 1 tablet (10 mg total) by mouth daily.  90 tablet  1  . QVAR 80 MCG/ACT inhaler INHALE 2 PUFFS INTO THE LUNGS 2 TIMES A DAY  3 g  2  . tiotropium (SPIRIVA HANDIHALER) 18 MCG inhalation capsule Place 1 capsule (18 mcg total) into inhaler and inhale daily.  30 capsule  3  . DISCONTD: propranolol (INDERAL) 60 MG tablet Take 1 tablet (60 mg total) by mouth 2 (two) times daily.  60 tablet  3    BP 140/92  Temp 97.9 F (36.6 C) (Oral)  Wt 171 lb (77.565 kg)        Objective:   Physical Exam  Constitutional: She appears well-developed and well-nourished.  Cardiovascular: Normal rate, regular rhythm and normal heart sounds.   Pulmonary/Chest: Effort normal and breath sounds normal. She has no wheezes.  Abdominal: Soft. Bowel sounds are normal. She exhibits no mass.  Musculoskeletal:       No left hip tenderness          Assessment & Plan:

## 2012-08-12 ENCOUNTER — Ambulatory Visit: Payer: Medicare Other | Admitting: Internal Medicine

## 2012-08-18 DIAGNOSIS — Z1231 Encounter for screening mammogram for malignant neoplasm of breast: Secondary | ICD-10-CM | POA: Diagnosis not present

## 2012-08-26 ENCOUNTER — Telehealth: Payer: Self-pay | Admitting: Internal Medicine

## 2012-08-26 NOTE — Telephone Encounter (Signed)
Patient called stating that she would like to have a carotid doppler done at [facility]. Please advise/assist.

## 2012-08-27 NOTE — Telephone Encounter (Signed)
Pt thought she had to go every 3 months , but q 6 months

## 2012-08-27 NOTE — Telephone Encounter (Signed)
She had one in June, 2013.  She will be due for flu scan in Dec, 2013.  Is there a reason why she wants it done sooner?

## 2012-09-10 ENCOUNTER — Encounter: Payer: Self-pay | Admitting: Vascular Surgery

## 2012-09-11 ENCOUNTER — Ambulatory Visit (INDEPENDENT_AMBULATORY_CARE_PROVIDER_SITE_OTHER): Payer: Medicare Other | Admitting: Vascular Surgery

## 2012-09-11 ENCOUNTER — Encounter: Payer: Self-pay | Admitting: Vascular Surgery

## 2012-09-11 VITALS — BP 128/76 | HR 73 | Resp 16 | Ht 61.0 in | Wt 171.0 lb

## 2012-09-11 DIAGNOSIS — I83893 Varicose veins of bilateral lower extremities with other complications: Secondary | ICD-10-CM | POA: Diagnosis not present

## 2012-09-11 HISTORY — PX: ENDOVENOUS ABLATION SAPHENOUS VEIN W/ LASER: SUR449

## 2012-09-11 NOTE — Progress Notes (Signed)
Laser Ablation Procedure      Date: 09/11/2012    Christina Mckinney DOB:1932-01-23  Consent signed: Yes  Surgeon:T.F. Koran Seabrook  Procedure: Laser Ablation: left Greater Saphenous Vein  BP 128/76  Pulse 73  Resp 16  Ht 5\' 1"  (1.549 m)  Wt 171 lb (77.565 kg)  BMI 32.31 kg/m2  Start time: 10:35AM   End time: 11:50AM  Tumescent Anesthesia: 425 cc 0.9% NaCl with 50 cc Lidocaine HCL with 1% Epi and 15 cc 8.4% NaHCO3  Local Anesthesia: 2 cc Lidocaine HCL and NaHCO3 (ratio 2:1)  Continuous Mode: 15 Watts Total Energy 2301 Joules Total Time2:33   Sclerotherapy: 0.3 %Sotradecol. Patient received a total of 3 cc  Left leg    Patient tolerated procedure well: Yes    Description of Procedure:  After marking the course of the saphenous vein and the secondary varicosities in the standing position, the patient was placed on the operating table in the supine position, and the left leg was prepped and draped in sterile fashion. Local anesthetic was administered, and under ultrasound guidance the saphenous vein was accessed with a micro needle and guide wire; then the micro puncture sheath was placed. A guide wire was inserted to the saphenofemoral junction, followed by a 5 french sheath.  The position of the sheath and then the laser fiber below the junction was confirmed using the ultrasound and visualization of the aiming beam.  Tumescent anesthesia was administered along the course of the saphenous vein using ultrasound guidance. Protective laser glasses were placed on the patient, and the laser was fired at at 15 watt continuous mode.  For a total of 2301 joules.  A steri strip was applied to the puncture site.   Sclerotherapy was performed to 4 perforator vessels using 3  cc .3% Sotradecol foam via a 30 gauge needle.  ABD pads and thigh high compression stockings were applied.  Ace wrap bandages were applied  at the top of the saphenofemoral junction.  Blood loss was less than 15 cc.  The patient  ambulated out of the operating room having tolerated the procedure well.

## 2012-09-12 ENCOUNTER — Encounter: Payer: Self-pay | Admitting: Vascular Surgery

## 2012-09-16 ENCOUNTER — Telehealth: Payer: Self-pay | Admitting: *Deleted

## 2012-09-16 NOTE — Telephone Encounter (Signed)
09/16/2012  Time: 8:42 AM   Patient Name: Christina Mckinney  Patient of: T.F. Early  Procedure:Laser Ablation left  Greater saphenous vein and sclerotherapy left leg  09-11-2012  Reached patient at home and checked  Her status  Yes    Comments/Actions Taken: [name] states she is having no problems with pain or swelling.  She states she is up and walking around the house this morning. Reviewed post procedural instructions and reminded her of post laser ablation duplex and follow up with Dr. Arbie Cookey on 09-18-2012.    @SIGNATURE @

## 2012-09-17 ENCOUNTER — Encounter: Payer: Self-pay | Admitting: Vascular Surgery

## 2012-09-18 ENCOUNTER — Encounter: Payer: Self-pay | Admitting: Vascular Surgery

## 2012-09-18 ENCOUNTER — Ambulatory Visit (INDEPENDENT_AMBULATORY_CARE_PROVIDER_SITE_OTHER): Payer: Medicare Other | Admitting: Vascular Surgery

## 2012-09-18 ENCOUNTER — Encounter (INDEPENDENT_AMBULATORY_CARE_PROVIDER_SITE_OTHER): Payer: Medicare Other | Admitting: *Deleted

## 2012-09-18 VITALS — BP 139/85 | HR 74 | Resp 18 | Ht 61.0 in | Wt 171.0 lb

## 2012-09-18 DIAGNOSIS — Z48812 Encounter for surgical aftercare following surgery on the circulatory system: Secondary | ICD-10-CM | POA: Diagnosis not present

## 2012-09-18 DIAGNOSIS — Z96619 Presence of unspecified artificial shoulder joint: Secondary | ICD-10-CM | POA: Diagnosis not present

## 2012-09-18 DIAGNOSIS — IMO0002 Reserved for concepts with insufficient information to code with codable children: Secondary | ICD-10-CM | POA: Diagnosis not present

## 2012-09-18 DIAGNOSIS — I83893 Varicose veins of bilateral lower extremities with other complications: Secondary | ICD-10-CM | POA: Diagnosis not present

## 2012-09-18 DIAGNOSIS — M19019 Primary osteoarthritis, unspecified shoulder: Secondary | ICD-10-CM | POA: Diagnosis not present

## 2012-09-18 DIAGNOSIS — M1710 Unilateral primary osteoarthritis, unspecified knee: Secondary | ICD-10-CM | POA: Diagnosis not present

## 2012-09-18 NOTE — Progress Notes (Signed)
Patient has today for followup of her left great saphenous vein laser ablation and sclerotherapy of the telangiectasia over her distal calf and ankle. She had mild discomfort associated with this. She has been compliant with her compression garments.  Physical exam she does have significant bruising on the medial aspect of her thigh and does have a palpable great saphenous vein running to the course of her thigh. This is superficial in this location. The sclerotherapy sites bed with typical Matilde Pottenger followup appearance.  Venous duplex today reveals closure of her great saphenous vein from the insertion site up to just below the saphenofemoral junction. She does have no evidence of DVT.  Quite pleased with her results as is the patient. She will continue her stocking for one additional week and then discontinued use. We will see her again on an as-needed basis

## 2012-09-21 ENCOUNTER — Other Ambulatory Visit: Payer: Self-pay | Admitting: Internal Medicine

## 2012-09-24 ENCOUNTER — Ambulatory Visit: Payer: Medicare Other | Admitting: Internal Medicine

## 2012-09-26 ENCOUNTER — Other Ambulatory Visit: Payer: Self-pay | Admitting: Internal Medicine

## 2012-10-01 ENCOUNTER — Encounter (HOSPITAL_COMMUNITY): Payer: Self-pay

## 2012-10-01 ENCOUNTER — Telehealth: Payer: Self-pay | Admitting: Internal Medicine

## 2012-10-01 ENCOUNTER — Emergency Department (HOSPITAL_COMMUNITY): Payer: Medicare Other

## 2012-10-01 ENCOUNTER — Emergency Department (HOSPITAL_COMMUNITY)
Admission: EM | Admit: 2012-10-01 | Discharge: 2012-10-02 | Disposition: A | Discharge status: Home / Self Care | Payer: Medicare Other | Attending: Emergency Medicine | Admitting: Emergency Medicine

## 2012-10-01 DIAGNOSIS — F3289 Other specified depressive episodes: Secondary | ICD-10-CM | POA: Insufficient documentation

## 2012-10-01 DIAGNOSIS — E785 Hyperlipidemia, unspecified: Secondary | ICD-10-CM | POA: Insufficient documentation

## 2012-10-01 DIAGNOSIS — I82409 Acute embolism and thrombosis of unspecified deep veins of unspecified lower extremity: Secondary | ICD-10-CM | POA: Diagnosis not present

## 2012-10-01 DIAGNOSIS — R112 Nausea with vomiting, unspecified: Secondary | ICD-10-CM | POA: Diagnosis not present

## 2012-10-01 DIAGNOSIS — Z8679 Personal history of other diseases of the circulatory system: Secondary | ICD-10-CM | POA: Diagnosis not present

## 2012-10-01 DIAGNOSIS — Z862 Personal history of diseases of the blood and blood-forming organs and certain disorders involving the immune mechanism: Secondary | ICD-10-CM | POA: Diagnosis not present

## 2012-10-01 DIAGNOSIS — F329 Major depressive disorder, single episode, unspecified: Secondary | ICD-10-CM | POA: Diagnosis not present

## 2012-10-01 DIAGNOSIS — Y929 Unspecified place or not applicable: Secondary | ICD-10-CM | POA: Insufficient documentation

## 2012-10-01 DIAGNOSIS — S39012A Strain of muscle, fascia and tendon of lower back, initial encounter: Secondary | ICD-10-CM

## 2012-10-01 DIAGNOSIS — R748 Abnormal levels of other serum enzymes: Secondary | ICD-10-CM | POA: Diagnosis not present

## 2012-10-01 DIAGNOSIS — Z7982 Long term (current) use of aspirin: Secondary | ICD-10-CM | POA: Diagnosis not present

## 2012-10-01 DIAGNOSIS — Z8639 Personal history of other endocrine, nutritional and metabolic disease: Secondary | ICD-10-CM | POA: Insufficient documentation

## 2012-10-01 DIAGNOSIS — IMO0002 Reserved for concepts with insufficient information to code with codable children: Secondary | ICD-10-CM | POA: Diagnosis not present

## 2012-10-01 DIAGNOSIS — F411 Generalized anxiety disorder: Secondary | ICD-10-CM | POA: Insufficient documentation

## 2012-10-01 DIAGNOSIS — Z87891 Personal history of nicotine dependence: Secondary | ICD-10-CM | POA: Insufficient documentation

## 2012-10-01 DIAGNOSIS — I10 Essential (primary) hypertension: Secondary | ICD-10-CM | POA: Insufficient documentation

## 2012-10-01 DIAGNOSIS — X58XXXA Exposure to other specified factors, initial encounter: Secondary | ICD-10-CM | POA: Insufficient documentation

## 2012-10-01 DIAGNOSIS — Z79899 Other long term (current) drug therapy: Secondary | ICD-10-CM | POA: Insufficient documentation

## 2012-10-01 DIAGNOSIS — Z860100 Personal history of colon polyps, unspecified: Secondary | ICD-10-CM | POA: Insufficient documentation

## 2012-10-01 DIAGNOSIS — Z8719 Personal history of other diseases of the digestive system: Secondary | ICD-10-CM | POA: Diagnosis not present

## 2012-10-01 DIAGNOSIS — Z8711 Personal history of peptic ulcer disease: Secondary | ICD-10-CM | POA: Insufficient documentation

## 2012-10-01 DIAGNOSIS — E039 Hypothyroidism, unspecified: Secondary | ICD-10-CM | POA: Diagnosis not present

## 2012-10-01 DIAGNOSIS — K5900 Constipation, unspecified: Secondary | ICD-10-CM | POA: Diagnosis not present

## 2012-10-01 DIAGNOSIS — I2699 Other pulmonary embolism without acute cor pulmonale: Secondary | ICD-10-CM | POA: Diagnosis not present

## 2012-10-01 DIAGNOSIS — Y939 Activity, unspecified: Secondary | ICD-10-CM | POA: Insufficient documentation

## 2012-10-01 DIAGNOSIS — R1011 Right upper quadrant pain: Secondary | ICD-10-CM | POA: Diagnosis not present

## 2012-10-01 DIAGNOSIS — N329 Bladder disorder, unspecified: Secondary | ICD-10-CM | POA: Diagnosis not present

## 2012-10-01 DIAGNOSIS — K219 Gastro-esophageal reflux disease without esophagitis: Secondary | ICD-10-CM | POA: Insufficient documentation

## 2012-10-01 LAB — URINALYSIS, ROUTINE W REFLEX MICROSCOPIC
Bilirubin Urine: NEGATIVE
Ketones, ur: NEGATIVE mg/dL
Nitrite: NEGATIVE
Urobilinogen, UA: 0.2 mg/dL (ref 0.0–1.0)

## 2012-10-01 LAB — COMPREHENSIVE METABOLIC PANEL
AST: 30 U/L (ref 0–37)
Albumin: 4 g/dL (ref 3.5–5.2)
Calcium: 9.9 mg/dL (ref 8.4–10.5)
Chloride: 101 mEq/L (ref 96–112)
Creatinine, Ser: 0.99 mg/dL (ref 0.50–1.10)
Total Bilirubin: 0.4 mg/dL (ref 0.3–1.2)

## 2012-10-01 LAB — CBC WITH DIFFERENTIAL/PLATELET
Basophils Absolute: 0 10*3/uL (ref 0.0–0.1)
Eosinophils Relative: 2 % (ref 0–5)
HCT: 36.4 % (ref 36.0–46.0)
Hemoglobin: 12.2 g/dL (ref 12.0–15.0)
Lymphs Abs: 2 10*3/uL (ref 0.7–4.0)
MCH: 31.3 pg (ref 26.0–34.0)
MCHC: 33.5 g/dL (ref 30.0–36.0)
Neutro Abs: 4.4 10*3/uL (ref 1.7–7.7)
RBC: 3.9 MIL/uL (ref 3.87–5.11)

## 2012-10-01 LAB — LIPASE, BLOOD: Lipase: 60 U/L — ABNORMAL HIGH (ref 11–59)

## 2012-10-01 MED ORDER — IOHEXOL 300 MG/ML  SOLN
100.0000 mL | Freq: Once | INTRAMUSCULAR | Status: AC | PRN
  Administered 2012-10-01: 100 mL via INTRAVENOUS

## 2012-10-01 MED ORDER — SODIUM CHLORIDE 0.9 % IV BOLUS (SEPSIS)
500.0000 mL | Freq: Once | INTRAVENOUS | Status: AC
  Administered 2012-10-01: 500 mL via INTRAVENOUS

## 2012-10-01 MED ORDER — MORPHINE SULFATE 4 MG/ML IJ SOLN
4.0000 mg | Freq: Once | INTRAMUSCULAR | Status: AC
  Administered 2012-10-01: 4 mg via INTRAVENOUS
  Filled 2012-10-01: qty 1

## 2012-10-01 MED ORDER — ONDANSETRON HCL 4 MG/2ML IJ SOLN
4.0000 mg | Freq: Once | INTRAMUSCULAR | Status: AC
  Administered 2012-10-01: 4 mg via INTRAVENOUS
  Filled 2012-10-01: qty 2

## 2012-10-01 NOTE — ED Notes (Signed)
Patient reports that she has not had a normal BM in a week. Patient has been taking Citracal and MOM x 2 days. Patient states she had some watery stool, but no BM. Patient also reports that she has a foul urine odor as well.

## 2012-10-01 NOTE — ED Provider Notes (Signed)
History  This chart was scribed for [name], MD by Bennett Scrape, ED Scribe. This patient was seen in room WA22/WA22 and the patient's care was started at 7:46 PM.  CSN: [id number]  Arrival date & time 10/01/12  1706   First MD Initiated Contact with Patient 10/01/12 1946      Chief Complaint  Patient presents with  . Abdominal Pain  . Flank Pain     Patient is a 78 y.o. female presenting with abdominal pain. The history is provided by the patient. No language interpreter was used.  Abdominal Pain The primary symptoms of the illness include abdominal pain, nausea and vomiting. The primary symptoms of the illness do not include fever, shortness of breath, diarrhea, hematochezia or dysuria. The current episode started more than 2 days ago. The onset of the illness was gradual. The problem has been gradually worsening.  The abdominal pain radiates to the right flank. The abdominal pain is relieved by nothing. The abdominal pain is exacerbated by certain positions.   The patient has had a change in bowel habit (Pt reports constipation). Additional symptoms associated with the illness include constipation. Symptoms associated with the illness do not include chills or hematuria.    Christina Mckinney is a 78 y.o. female who presents to the Emergency Department complaining of one week of gradual onset, gradually worsening, constant abdominal pain that radiates to the back and right flank that she attributes to constipation with associated nausea and emesis. The right flank pain and abdominal pain are worse with pressure. She reports one episode of small hard stool this morning but denies passing gas. Pt reports that she has problems with "narrow" stools since her bowels were "twisted" during her hysterectomy 26 years ago. She reports taking magnesium citrate and philip's tablets with no improvement. She states that she was on Coumadin for a year for a PE but is now on one baby ASA daily. She  denies fever, hematuria and dysuria as associated symptoms. She also has a h/o GERD, anxiety and anemia. She is an occasional alcohol user and is a former smoker.  Past Medical History  Diagnosis Date  . Depressive disorder, not elsewhere classified   . Diverticulitis   . GERD (gastroesophageal reflux disease)   . Other and unspecified hyperlipidemia   . Unspecified essential hypertension   . Other abnormal glucose   . Unspecified hypothyroidism   . Personal history of colonic polyps   . Anxiety state, unspecified   . Personal history of peptic ulcer disease   . Osteoarthritis   . Gout, unspecified   . Essential and other specified forms of tremor   . Internal hemorrhoids without mention of complication   . Anemia, unspecified   . Unspecified disorder of bladder   . Pulmonary embolism   . DVT (deep venous thrombosis)   . Varicose veins     Past Surgical History  Procedure Date  . Cholecystectomy   . Abdominal hysterectomy   . Nasal septum surgery   . Shoulder surgery   . Knee arthroscopy     right   . Eye surgery   . Endovenous ablation saphenous vein w/ laser 09-11-2012    left greater saphenous vein   by Gretta Began MD    Family History  Problem Relation Age of Onset  . Colon cancer Neg Hx   . Other Mother     varicose veins  . Heart disease Father   . Hyperlipidemia Father   . Heart  attack Father   . Diabetes Daughter   . Heart disease Daughter   . Hyperlipidemia Daughter   . Hypertension Daughter     History  Substance Use Topics  . Smoking status: Former Smoker    Types: Cigarettes    Quit date: 11/12/1988  . Smokeless tobacco: Never Used  . Alcohol Use: 2.0 oz/week    4 drink(s) per week     Comment: occasional wine     No OB history provided.  Review of Systems  Constitutional: Negative for fever and chills.  Respiratory: Negative for shortness of breath.   Cardiovascular: Negative for chest pain.  Gastrointestinal: Positive for nausea,  vomiting, abdominal pain and constipation. Negative for diarrhea, blood in stool and hematochezia.  Genitourinary: Positive for flank pain. Negative for dysuria and hematuria.  All other systems reviewed and are negative.    Allergies  Crestor and Lipitor  Home Medications   Current Outpatient Rx  Name  Route  Sig  Dispense  Refill  . ASPIRIN 81 MG PO TABS   Oral   Take 1 tablet (81 mg total) by mouth daily.   30 tablet      . AZELASTINE HCL 137 MCG/SPRAY NA SOLN   Nasal   Place 1 spray into the nose daily as needed. Use in each nostril as directed         . BECLOMETHASONE DIPROPIONATE 80 MCG/ACT IN AERS   Inhalation   Inhale 1 puff into the lungs 2 (two) times daily.         Marland Kitchen VITAMIN D 2000 UNITS PO TABS   Oral   Take 2,000 Units by mouth daily.           Marland Kitchen COENZYME Q10 30 MG PO CAPS   Oral   Take 30 mg by mouth 3 (three) times daily.           Marland Kitchen DARIFENACIN HYDROBROMIDE ER 15 MG PO TB24   Oral   Take 15 mg by mouth daily.         Marland Kitchen DICLOFENAC SODIUM 1 % TD GEL   Topical   Apply 1 application topically 4 (four) times daily. Apply 2 grams qid.   2 Tube   2   . FEBUXOSTAT 40 MG PO TABS   Oral   Take 40 mg by mouth daily.         Marland Kitchen FLUTICASONE PROPIONATE 50 MCG/ACT NA SUSP   Nasal   Place 2 sprays into the nose daily. Allergies         . FUROSEMIDE 40 MG PO TABS   Oral   Take 40 mg by mouth daily. As directed.         Marland Kitchen GABAPENTIN 300 MG PO CAPS   Oral   Take 1 capsule (300 mg total) by mouth daily.   90 capsule   1   . GLUCOSAMINE-CHONDROITIN 500-250 MG PO CAPS   Oral   Take 1 capsule by mouth at bedtime.          Marland Kitchen LANSOPRAZOLE 30 MG PO CPDR   Oral   Take 1 capsule (30 mg total) by mouth 2 (two) times daily.   60 capsule   0   . LEVOTHYROXINE SODIUM 50 MCG PO TABS   Oral   Take 1 tablet (50 mcg total) by mouth daily.   90 tablet   1   . LOSARTAN POTASSIUM 100 MG PO TABS   Oral   Take 100 mg by  mouth daily.           Marland Kitchen METHOCARBAMOL 500 MG PO TABS   Oral   Take 500 mg by mouth 2 (two) times daily as needed. Muscle spasms         . METOPROLOL SUCCINATE ER 50 MG PO TB24   Oral   Take 50 mg by mouth daily. Take with or immediately following a meal.         . RED YEAST RICE 600 MG PO CAPS   Oral   Take 1 capsule by mouth daily.         Marland Kitchen DOCUSATE SODIUM 100 MG PO CAPS   Oral   Take 1 capsule (100 mg total) by mouth every 12 (twelve) hours.   60 capsule   0   . HYDROCODONE-ACETAMINOPHEN 5-325 MG PO TABS   Oral   Take 2 tablets by mouth every 4 (four) hours as needed for pain.   10 tablet   0   . IBUPROFEN 200 MG PO TABS   Oral   Take 2 tablets (400 mg total) by mouth every 8 (eight) hours as needed for pain.   30 tablet   0   . METHOCARBAMOL 500 MG PO TABS   Oral   Take 1 tablet (500 mg total) by mouth 2 (two) times daily as needed (pain).   20 tablet   0     Triage Vitals: BP 129/98  Pulse 82  Temp 98.1 F (36.7 C) (Oral)  Resp 16  SpO2 98%  Physical Exam  Nursing note and vitals reviewed. Constitutional: She is oriented to person, place, and time. She appears well-developed and well-nourished. No distress.  HENT:  Head: Normocephalic and atraumatic.  Mouth/Throat: Oropharynx is clear and moist.  Eyes: Conjunctivae normal and EOM are normal. Pupils are equal, round, and reactive to light.  Neck: Normal range of motion. Neck supple. No tracheal deviation present.  Cardiovascular: Normal rate, regular rhythm and normal heart sounds.  Exam reveals no gallop and no friction rub.   No murmur heard. Pulmonary/Chest: Effort normal and breath sounds normal. No respiratory distress.  Abdominal: Soft. She exhibits no mass. There is tenderness (lateral RUQ). There is no rebound and no guarding.  Musculoskeletal: Normal range of motion. She exhibits no edema (no calf swelling) and no tenderness (no calf tenderness).       No flank tenderness to percussion   Neurological: She is  alert and oriented to person, place, and time.  Skin: Skin is warm and dry.  Psychiatric: She has a normal mood and affect. Her behavior is normal.    ED Course  Procedures (including critical care time)  DIAGNOSTIC STUDIES: Oxygen Saturation is 98% on room air, normal by my interpretation.    COORDINATION OF CARE: 8:09 PM- Discussed treatment plan which includes CBC, UA and CT of abdomen with pt at bedside and pt agreed to plan.  8:15 PM- Ordered 4 mg morphine injection, 4 mg injection of Zofran and 500 mL of Bolus.  Labs Reviewed  URINALYSIS, ROUTINE W REFLEX MICROSCOPIC - Abnormal; Notable for the following:    Leukocytes, UA TRACE (*)     All other components within normal limits  LIPASE, BLOOD - Abnormal; Notable for the following:    Lipase 60 (*)     All other components within normal limits  COMPREHENSIVE METABOLIC PANEL - Abnormal; Notable for the following:    GFR calc non Af Amer 53 (*)  GFR calc Af Amer 62 (*)     All other components within normal limits  URINE MICROSCOPIC-ADD ON  CBC WITH DIFFERENTIAL   Ct Abdomen Pelvis W Contrast  10/02/2012  *RADIOLOGY REPORT*  Clinical Data: Right flank and right upper quadrant pain  CT ABDOMEN AND PELVIS WITH CONTRAST  Technique:  Multidetector CT imaging of the abdomen and pelvis was performed following the standard protocol during bolus administration of intravenous contrast.  Contrast: OMNIPAQUE IOHEXOL 300 MG/ML  SOLN  Comparison: 09/01/2009  Findings: Mild linear opacities at the lung bases.  Small hiatal hernia.  Normal heart size.  No pleural or pericardial effusion.  Unremarkable liver, spleen, pancreas, adrenal glands.  Status post cholecystectomy.  There is mild central intrahepatic and extrahepatic biliary ductal dilatation with smooth tapering to the level of the ampulla.  The renal cortex mildly atrophied.  Symmetric renal enhancement. No hydronephrosis or hydroureter.  No bowel obstruction.  Colonic  diverticulosis.  Appendix not identified.  No right lower quadrant inflammation.  No free intraperitoneal air or fluid.  No lymphadenopathy.  Retroaortic left renal vein.  There is scattered atherosclerotic calcification of the aorta and its branches. No aneurysmal dilatation.  Thin-walled bladder.  Absent uterus.  No adnexal mass.  Multilevel degenerative changes of the imaged spine.  Avulsion of the right greater trochanter is not acute appearing however new from 2010.  IMPRESSION: Low attenuation of the liver is nonspecific post contrast however can be seen with hepatic steatosis.  There is mild biliary ductal dilatation to the level of the ampulla.  Correlate with LFTs and ERCP if warranted.  No CT evidence for pancreatitis despite the elevated lipase.  No hydronephrosis.   Original Report Authenticated By: Jearld Lesch, M.D.      1. Back strain   2. Elevated lipase       MDM  I personally performed the services described in this documentation, which was scribed in my presence. The recorded information has been reviewed and is accurate.   Pt pain is reproduced completely with palpation of lower thoracic paraspinal muscles and worse when she turns torso. Abd is soft, NT, ND, with no rebound or guarding. Will treat for back strain and have pt f/u with her GI md for lab and CT irregularities. Pt agrees with plan.   [name], MD 10/02/12 [phone]

## 2012-10-01 NOTE — Telephone Encounter (Signed)
Patient Information:  Caller Name: Mariany  Phone: [phone]  Patient: Christina, Mckinney  Gender: Female  DOB: 12/11/1931  Age: 78 Years  PCP: Artist Pais Doe-Hyun [name]) (Adults only)   Symptoms  Reason For Call & Symptoms: no bm x 3 days, took a laxative and has only had a small amount of water  Reviewed Health History In EMR: Yes  Reviewed Medications In EMR: Yes  Reviewed Allergies In EMR: Yes  Date of Onset of Symptoms: 08/31/2012  Treatments Tried: Magnesium Citrate on 11/19  Treatments Tried Worked: No  Guideline(s) Used:  Constipation  Abdominal Pain - Female  Disposition Per Guideline:   Go to ED Now (or to Office with PCP Approval)  Reason For Disposition Reached:   Constant abdominal pain lasting > 2 hours  Advice Given:  Fluids:  Sip clear fluids only (e.g., water, flat soft drinks or 1/2 strength fruit juice) until the pain has been gone for over 2 hours. Then slowly return to a regular diet.  Call Back If:  You become worse.  Call Back If:  You become worse.  Office Follow Up:  Does the office need to follow up with this patient?: No  Instructions For The Office: N/A  RN Overrode Recommendation:  Go To ED  Sent to ED, too late in day to make appt.  RN Note:  Ongoing abdominal pain with abd. swelling.  Sent to ED for evaluation.

## 2012-10-02 MED ORDER — METHOCARBAMOL 500 MG PO TABS: 500.0000 mg | ORAL_TABLET | Freq: Two times a day (BID) | ORAL | Status: DC | PRN

## 2012-10-02 MED ORDER — HYDROCODONE-ACETAMINOPHEN 5-325 MG PO TABS: 2.0000 | ORAL_TABLET | ORAL | Status: DC | PRN

## 2012-10-02 MED ORDER — IBUPROFEN 200 MG PO TABS: 400.0000 mg | ORAL_TABLET | Freq: Three times a day (TID) | ORAL | Status: DC | PRN

## 2012-10-02 MED ORDER — DOCUSATE SODIUM 100 MG PO CAPS: 100.0000 mg | ORAL_CAPSULE | Freq: Two times a day (BID) | ORAL | Status: DC

## 2012-10-02 NOTE — Telephone Encounter (Signed)
Pt sent ot ED by [facility]. Closed encounter.

## 2012-10-03 ENCOUNTER — Encounter: Payer: Self-pay | Admitting: Internal Medicine

## 2012-10-03 ENCOUNTER — Ambulatory Visit (INDEPENDENT_AMBULATORY_CARE_PROVIDER_SITE_OTHER): Payer: Medicare Other | Admitting: Internal Medicine

## 2012-10-03 ENCOUNTER — Encounter: Payer: Self-pay | Admitting: Gastroenterology

## 2012-10-03 VITALS — BP 152/80 | HR 80 | Temp 98.0°F | Wt 171.0 lb

## 2012-10-03 DIAGNOSIS — R112 Nausea with vomiting, unspecified: Secondary | ICD-10-CM | POA: Diagnosis not present

## 2012-10-03 DIAGNOSIS — I10 Essential (primary) hypertension: Secondary | ICD-10-CM

## 2012-10-03 DIAGNOSIS — R109 Unspecified abdominal pain: Secondary | ICD-10-CM | POA: Diagnosis not present

## 2012-10-03 MED ORDER — LUBIPROSTONE 8 MCG PO CAPS: 8.0000 ug | ORAL_CAPSULE | Freq: Two times a day (BID) | ORAL | Status: DC

## 2012-10-03 NOTE — Progress Notes (Signed)
Subjective:    Patient ID: Christina Mckinney, female    DOB: [birth year], 78 y.o.   MRN: [id number]  HPI  78 year old white female with history of hypertension, pulmonary embolism, COPD and restless leg syndrome for emergency room followup. She was seen on 10/01/2012 secondary to right-sided back/flank and abdominal discomfort. Patient reports she has been trying to exercise recently. She is unclear whether she may have strained her flank muscles or thoracolumbar spine. Symptoms seem to be worse when she is trying to go from sitting to laying position. Patient also reports constipation and abdominal bloating. She thinks part of her abdominal discomfort is secondary to gas pains.  She denies any fever or chills. Her ER workup included CT of abdomen and pelvis. CT was unremarkable. She has low attenuation of the liver which is nonspecific and can be seen in hepatic steatosis. She has mild biliary ductal dilatation the level of the ampulla. Her LFTs were unremarkable.  Lab Results  Component Value Date   ALT 11 10/01/2012   AST 30 10/01/2012   ALKPHOS 81 10/01/2012   BILITOT 0.4 10/01/2012     Review of Systems Negative for fever or chills,  Mild nausea (chronic), no vomiting Negative for shortness of breath    Past Medical History  Diagnosis Date  . Depressive disorder, not elsewhere classified   . Diverticulitis   . GERD (gastroesophageal reflux disease)   . Other and unspecified hyperlipidemia   . Unspecified essential hypertension   . Other abnormal glucose   . Unspecified hypothyroidism   . Personal history of colonic polyps   . Anxiety state, unspecified   . Personal history of peptic ulcer disease   . Osteoarthritis   . Gout, unspecified   . Essential and other specified forms of tremor   . Internal hemorrhoids without mention of complication   . Anemia, unspecified   . Unspecified disorder of bladder   . Pulmonary embolism   . DVT (deep venous thrombosis)   . Varicose veins      History   Social History  . Marital Status: Widowed    Spouse Name: N/A    Number of Children: 3  . Years of Education: N/A   Occupational History  . Retired     Social History Main Topics  . Smoking status: Former Smoker    Types: Cigarettes    Quit date: 11/12/1988  . Smokeless tobacco: Never Used  . Alcohol Use: 2.0 oz/week    4 drink(s) per week     Comment: occasional wine   . Drug Use: No  . Sexually Active: Not on file   Other Topics Concern  . Not on file   Social History Narrative  . No narrative on file    Past Surgical History  Procedure Date  . Cholecystectomy   . Abdominal hysterectomy   . Nasal septum surgery   . Shoulder surgery   . Knee arthroscopy     right   . Eye surgery   . Endovenous ablation saphenous vein w/ laser 09-11-2012    left greater saphenous vein   by Gretta Began MD    Family History  Problem Relation Age of Onset  . Colon cancer Neg Hx   . Other Mother     varicose veins  . Heart disease Father   . Hyperlipidemia Father   . Heart attack Father   . Diabetes Daughter   . Heart disease Daughter   . Hyperlipidemia Daughter   . Hypertension  Daughter     Allergies  Allergen Reactions  . Crestor (Rosuvastatin)     Left hip pain  . Lipitor (Atorvastatin)     Pain in hips    Current Outpatient Prescriptions on File Prior to Visit  Medication Sig Dispense Refill  . aspirin 81 MG tablet Take 1 tablet (81 mg total) by mouth daily.  30 tablet    . azelastine (ASTELIN) 137 MCG/SPRAY nasal spray Place 1 spray into the nose daily as needed. Use in each nostril as directed      . beclomethasone (QVAR) 80 MCG/ACT inhaler Inhale 1 puff into the lungs 2 (two) times daily.      . Cholecalciferol (VITAMIN D) 2000 UNITS tablet Take 2,000 Units by mouth daily.        Marland Kitchen co-enzyme Q-10 30 MG capsule Take 30 mg by mouth 3 (three) times daily.        Marland Kitchen darifenacin (ENABLEX) 15 MG 24 hr tablet Take 15 mg by mouth daily.      .  diclofenac sodium (VOLTAREN) 1 % GEL Apply 1 application topically 4 (four) times daily. Apply 2 grams qid.  2 Tube  2  . docusate sodium (COLACE) 100 MG capsule Take 1 capsule (100 mg total) by mouth every 12 (twelve) hours.  60 capsule  0  . febuxostat (ULORIC) 40 MG tablet Take 40 mg by mouth daily.      . fluticasone (FLONASE) 50 MCG/ACT nasal spray Place 2 sprays into the nose daily. Allergies      . furosemide (LASIX) 40 MG tablet Take 40 mg by mouth daily. As directed.      . gabapentin (NEURONTIN) 300 MG capsule Take 1 capsule (300 mg total) by mouth daily.  90 capsule  1  . Glucosamine-Chondroitin 500-250 MG CAPS Take 1 capsule by mouth at bedtime.       Marland Kitchen HYDROcodone-acetaminophen (NORCO/VICODIN) 5-325 MG per tablet Take 2 tablets by mouth every 4 (four) hours as needed for pain.  10 tablet  0  . ibuprofen (ADVIL,MOTRIN) 200 MG tablet Take 2 tablets (400 mg total) by mouth every 8 (eight) hours as needed for pain.  30 tablet  0  . lansoprazole (PREVACID) 30 MG capsule Take 1 capsule (30 mg total) by mouth 2 (two) times daily.  60 capsule  0  . levothyroxine (SYNTHROID, LEVOTHROID) 50 MCG tablet Take 1 tablet (50 mcg total) by mouth daily.  90 tablet  1  . losartan (COZAAR) 100 MG tablet Take 100 mg by mouth daily.      . methocarbamol (ROBAXIN) 500 MG tablet Take 1 tablet (500 mg total) by mouth 2 (two) times daily as needed (pain).  20 tablet  0  . metoprolol succinate (TOPROL-XL) 50 MG 24 hr tablet Take 50 mg by mouth daily. Take with or immediately following a meal.      . Red Yeast Rice 600 MG CAPS Take 1 capsule by mouth daily.      . [DISCONTINUED] propranolol (INDERAL) 60 MG tablet Take 1 tablet (60 mg total) by mouth 2 (two) times daily.  60 tablet  3    BP 152/80  Pulse 80  Temp 98 F (36.7 C) (Oral)  Wt 171 lb (77.565 kg)    Objective:   Physical Exam  Constitutional: She is oriented to person, place, and time. She appears well-developed and well-nourished. No  distress.  HENT:  Head: Normocephalic and atraumatic.  Eyes: EOM are normal. Pupils are equal, round, and  reactive to light.  Cardiovascular: Normal rate, regular rhythm and normal heart sounds.   Pulmonary/Chest: Effort normal. She has no wheezes.  Abdominal: Soft. Bowel sounds are normal.       Mild right sided abdominal tenderness  Musculoskeletal: She exhibits no edema.       T11-12 rotated right  Neurological: She is alert and oriented to person, place, and time. No cranial nerve deficit.  Skin: Skin is warm and dry.  Psychiatric: She has a normal mood and affect. Her behavior is normal.          Assessment & Plan:

## 2012-10-03 NOTE — Assessment & Plan Note (Signed)
BP is sporadically elevated.  She took BP medications late this AM.  BP may also be elevated due to pain response.  Continue same medication regimen.  Reassess in 1 month.

## 2012-10-03 NOTE — Assessment & Plan Note (Signed)
78 year old white female was seen in ER for right flank pain. Her symptoms provoked by recent physical activity. Patient has somatic dysfunction at lower thoracic levels T11-12 and right side. Utilizedmyofascial release and HVLA to thoracolumbar spine. Patient tolerated well with immediate improvement. I also suspect her abdominal symptoms may be secondary to gas pains. She has issues with chronic constipation. She was given samples Amitiza 8 mcg to take twice daily.

## 2012-10-08 ENCOUNTER — Ambulatory Visit: Payer: Medicare Other | Admitting: Internal Medicine

## 2012-10-14 ENCOUNTER — Telehealth: Payer: Self-pay | Admitting: Internal Medicine

## 2012-10-14 ENCOUNTER — Telehealth: Payer: Self-pay | Admitting: Family Medicine

## 2012-10-14 MED ORDER — ALLOPURINOL 100 MG PO TABS: 100.0000 mg | ORAL_TABLET | Freq: Every day | ORAL | Status: DC

## 2012-10-14 NOTE — Telephone Encounter (Signed)
rx sent in electronically 

## 2012-10-14 NOTE — Telephone Encounter (Signed)
See previous message re: change to allopurinol.

## 2012-10-14 NOTE — Telephone Encounter (Signed)
Caller: Christina Mckinney/Patient; Phone: [phone]; Reason for Call: Patient calling about insurance company prior authorization for gout medication.  [employer] insists on MD call to approve uloric.  States prior Berkley Harvey was denied.  Would like PA done for [facility] "personally by Dr.  Artist Pais to call [phone], " and they will be happy to approve it.  Info to office for staff review/callback.   May reach patient [phone].

## 2012-10-14 NOTE — Telephone Encounter (Signed)
Pt's prior auth for Uloric was denied. Must try Allopurinol first, 300mg  daily. Please advise.

## 2012-10-14 NOTE — Telephone Encounter (Signed)
Change to allopurinol 100 mg - take 2 tabs once daily  #180 RF x1

## 2012-10-14 NOTE — Telephone Encounter (Signed)
Dr. Artist Pais,  I don't think you calling the insurance will help. I spoke with [employer] last week and today - until pt tries 300mg  Allopurinol daily, it will be denied.

## 2012-10-24 ENCOUNTER — Telehealth: Payer: Self-pay | Admitting: Internal Medicine

## 2012-10-24 NOTE — Telephone Encounter (Signed)
Jeanice Lim, this will need a prior authorization

## 2012-10-24 NOTE — Telephone Encounter (Signed)
Pt can only use uloric if she has adverse effect from allopurinol.  Please reassure pt that her gout will likely be equally well controlled on allopurinol.

## 2012-10-24 NOTE — Telephone Encounter (Signed)
Patient calls about pre-authorization for Uloric 40 mg.  RN pulled record to explain to patient the (2) previous notes.  She stated with Dr. Artist Pais she has tried Allopurinol in 2005 and one other.  When Uloric came out she requested to try it and it was effective.  Prior to this her gout has been so bad at times that was in a wheelchair.  RN told patient she would share this information with office.

## 2012-10-27 ENCOUNTER — Encounter (INDEPENDENT_AMBULATORY_CARE_PROVIDER_SITE_OTHER): Payer: Medicare Other

## 2012-10-27 DIAGNOSIS — I6529 Occlusion and stenosis of unspecified carotid artery: Secondary | ICD-10-CM | POA: Diagnosis not present

## 2012-10-28 DIAGNOSIS — Z01419 Encounter for gynecological examination (general) (routine) without abnormal findings: Secondary | ICD-10-CM | POA: Diagnosis not present

## 2012-10-29 NOTE — Telephone Encounter (Signed)
PA in progress. 

## 2012-10-31 ENCOUNTER — Ambulatory Visit: Payer: Medicare Other | Admitting: Gastroenterology

## 2012-11-03 ENCOUNTER — Other Ambulatory Visit (INDEPENDENT_AMBULATORY_CARE_PROVIDER_SITE_OTHER): Payer: Medicare Other

## 2012-11-03 DIAGNOSIS — I10 Essential (primary) hypertension: Secondary | ICD-10-CM | POA: Diagnosis not present

## 2012-11-03 DIAGNOSIS — E785 Hyperlipidemia, unspecified: Secondary | ICD-10-CM

## 2012-11-03 LAB — HEPATIC FUNCTION PANEL
Bilirubin, Direct: 0.1 mg/dL (ref 0.0–0.3)
Total Bilirubin: 0.6 mg/dL (ref 0.3–1.2)

## 2012-11-03 LAB — LIPID PANEL
HDL: 49.8 mg/dL (ref >39.00)
LDL Cholesterol: 114 mg/dL — ABNORMAL HIGH (ref 0–99)
Total CHOL/HDL Ratio: 4
Triglycerides: 133 mg/dL (ref 0.0–149.0)

## 2012-11-03 LAB — BASIC METABOLIC PANEL
Calcium: 9.6 mg/dL (ref 8.4–10.5)
Creatinine, Ser: 1.2 mg/dL (ref 0.4–1.2)

## 2012-11-10 ENCOUNTER — Encounter: Payer: Self-pay | Admitting: Internal Medicine

## 2012-11-10 ENCOUNTER — Ambulatory Visit (INDEPENDENT_AMBULATORY_CARE_PROVIDER_SITE_OTHER): Payer: Medicare Other | Admitting: Internal Medicine

## 2012-11-10 ENCOUNTER — Other Ambulatory Visit: Payer: Self-pay | Admitting: Internal Medicine

## 2012-11-10 VITALS — BP 130/80 | HR 84 | Temp 98.1°F

## 2012-11-10 DIAGNOSIS — G589 Mononeuropathy, unspecified: Secondary | ICD-10-CM | POA: Diagnosis not present

## 2012-11-10 DIAGNOSIS — I10 Essential (primary) hypertension: Secondary | ICD-10-CM | POA: Diagnosis not present

## 2012-11-10 DIAGNOSIS — M109 Gout, unspecified: Secondary | ICD-10-CM

## 2012-11-10 DIAGNOSIS — E785 Hyperlipidemia, unspecified: Secondary | ICD-10-CM | POA: Diagnosis not present

## 2012-11-10 MED ORDER — GABAPENTIN 300 MG PO CAPS: 300.0000 mg | ORAL_CAPSULE | Freq: Every day | ORAL | Status: DC

## 2012-11-10 MED ORDER — METOPROLOL SUCCINATE ER 50 MG PO TB24: 50.0000 mg | ORAL_TABLET | Freq: Every day | ORAL | Status: DC

## 2012-11-10 MED ORDER — ALPRAZOLAM 0.25 MG PO TABS: 0.2500 mg | ORAL_TABLET | Freq: Two times a day (BID) | ORAL | Status: DC | PRN

## 2012-11-10 MED ORDER — FUROSEMIDE 40 MG PO TABS: 40.0000 mg | ORAL_TABLET | Freq: Every day | ORAL | Status: DC

## 2012-11-10 MED ORDER — LEVOTHYROXINE SODIUM 50 MCG PO TABS: 50.0000 ug | ORAL_TABLET | Freq: Every day | ORAL | Status: DC

## 2012-11-10 MED ORDER — HYDROCODONE-ACETAMINOPHEN 5-325 MG PO TABS: 2.0000 | ORAL_TABLET | Freq: Four times a day (QID) | ORAL | Status: DC | PRN

## 2012-11-10 MED ORDER — METHOCARBAMOL 500 MG PO TABS: 500.0000 mg | ORAL_TABLET | Freq: Two times a day (BID) | ORAL | Status: DC | PRN

## 2012-11-10 NOTE — Assessment & Plan Note (Signed)
Patient has intermittent neuropathic pain especially lateral aspect of left lower leg.  Continue gabapentin 300 mg at bedtime.

## 2012-11-10 NOTE — Assessment & Plan Note (Signed)
Uloric switched to allopurinol due to cost reasons. She thinks she may have had a flare in her left toe. Monitor uric acid levels.

## 2012-11-10 NOTE — Assessment & Plan Note (Signed)
Stable.  No change in medication regimen.  BP: 130/80 mmHg  Lab Results  Component Value Date   CREATININE 1.2 11/03/2012

## 2012-11-10 NOTE — Progress Notes (Signed)
Subjective:    Patient ID: Christina Mckinney, female    DOB: Aug 09, 1932, 78 y.o.   MRN: [id number]  HPI  78 year old white female with history of hypertension and hyperlipidemia for followup. Patient reports back/thoracic pain improved.  Since previous visit patient thinks she may have had mild gout flare. She reports pain in her fourth digit of left foot. She was switched from Uloric to allopurinol due to cost reasons.   Patient had therapy varicose vein therapy in her left leg. Her varicosities have improved. However, she still has intermittent pains lateral aspect of left lower extremity.  Hyperlipidemia-patient reports Crestor caused bilateral hip pain. Symptoms worse on her left side. Since discontinuing Crestor, her leg symptoms are tolerable/improved.  Review of Systems Negative for chest pain or shortness of breath  Past Medical History  Diagnosis Date  . Depressive disorder, not elsewhere classified   . Diverticulitis   . GERD (gastroesophageal reflux disease)   . Other and unspecified hyperlipidemia   . Unspecified essential hypertension   . Other abnormal glucose   . Unspecified hypothyroidism   . Personal history of colonic polyps   . Anxiety state, unspecified   . Personal history of peptic ulcer disease   . Osteoarthritis   . Gout, unspecified   . Essential and other specified forms of tremor   . Internal hemorrhoids without mention of complication   . Anemia, unspecified   . Unspecified disorder of bladder   . Pulmonary embolism   . DVT (deep venous thrombosis)   . Varicose veins     History   Social History  . Marital Status: Widowed    Spouse Name: N/A    Number of Children: 3  . Years of Education: N/A   Occupational History  . Retired     Social History Main Topics  . Smoking status: Former Smoker    Types: Cigarettes    Quit date: 11/12/1988  . Smokeless tobacco: Never Used  . Alcohol Use: 2.0 oz/week    4 drink(s) per week     Comment:  occasional wine   . Drug Use: No  . Sexually Active: Not on file   Other Topics Concern  . Not on file   Social History Narrative  . No narrative on file    Past Surgical History  Procedure Date  . Cholecystectomy   . Abdominal hysterectomy   . Nasal septum surgery   . Shoulder surgery   . Knee arthroscopy     right   . Eye surgery   . Endovenous ablation saphenous vein w/ laser 09-11-2012    left greater saphenous vein   by Gretta Began MD    Family History  Problem Relation Age of Onset  . Colon cancer Neg Hx   . Other Mother     varicose veins  . Heart disease Father   . Hyperlipidemia Father   . Heart attack Father   . Diabetes Daughter   . Heart disease Daughter   . Hyperlipidemia Daughter   . Hypertension Daughter     Allergies  Allergen Reactions  . Crestor (Rosuvastatin)     Left hip pain  . Lipitor (Atorvastatin)     Pain in hips    Current Outpatient Prescriptions on File Prior to Visit  Medication Sig Dispense Refill  . allopurinol (ZYLOPRIM) 100 MG tablet Take 1 tablet (100 mg total) by mouth daily.  180 tablet  1  . aspirin 81 MG tablet Take 1 tablet (81 mg  total) by mouth daily.  30 tablet    . azelastine (ASTELIN) 137 MCG/SPRAY nasal spray Place 1 spray into the nose daily as needed. Use in each nostril as directed      . beclomethasone (QVAR) 80 MCG/ACT inhaler Inhale 1 puff into the lungs 2 (two) times daily.      . Cholecalciferol (VITAMIN D) 2000 UNITS tablet Take 2,000 Units by mouth daily.        Marland Kitchen co-enzyme Q-10 30 MG capsule Take 30 mg by mouth 3 (three) times daily.        Marland Kitchen darifenacin (ENABLEX) 15 MG 24 hr tablet Take 15 mg by mouth daily.      . diclofenac sodium (VOLTAREN) 1 % GEL Apply 1 application topically 4 (four) times daily. Apply 2 grams qid.  2 Tube  2  . docusate sodium (COLACE) 100 MG capsule Take 1 capsule (100 mg total) by mouth every 12 (twelve) hours.  60 capsule  0  . fluticasone (FLONASE) 50 MCG/ACT nasal spray Place  2 sprays into the nose daily. Allergies      . furosemide (LASIX) 40 MG tablet Take 1 tablet (40 mg total) by mouth daily. As directed.  90 tablet  1  . gabapentin (NEURONTIN) 300 MG capsule Take 1 capsule (300 mg total) by mouth daily.  90 capsule  1  . Glucosamine-Chondroitin 500-250 MG CAPS Take 1 capsule by mouth at bedtime.       Marland Kitchen ibuprofen (ADVIL,MOTRIN) 200 MG tablet Take 2 tablets (400 mg total) by mouth every 8 (eight) hours as needed for pain.  30 tablet  0  . lansoprazole (PREVACID) 30 MG capsule Take 1 capsule (30 mg total) by mouth 2 (two) times daily.  60 capsule  0  . levothyroxine (SYNTHROID, LEVOTHROID) 50 MCG tablet Take 1 tablet (50 mcg total) by mouth daily.  90 tablet  1  . losartan (COZAAR) 100 MG tablet Take 100 mg by mouth daily.      Marland Kitchen lubiprostone (AMITIZA) 8 MCG capsule Take 1 capsule (8 mcg total) by mouth 2 (two) times daily with a meal.  60 capsule  5  . [DISCONTINUED] propranolol (INDERAL) 60 MG tablet Take 1 tablet (60 mg total) by mouth 2 (two) times daily.  60 tablet  3    BP 130/80  Pulse 84  Temp 98.1 F (36.7 C) (Oral)       Objective:   Physical Exam  Constitutional: She is oriented to person, place, and time. She appears well-developed and well-nourished.  Cardiovascular: Normal rate and regular rhythm.   Pulmonary/Chest: Effort normal and breath sounds normal. She has no wheezes.  Musculoskeletal: She exhibits no edema.  Neurological: She is alert and oriented to person, place, and time.  Skin: Skin is warm and dry.  Psychiatric: She has a normal mood and affect. Her behavior is normal.          Assessment & Plan:

## 2012-11-10 NOTE — Assessment & Plan Note (Signed)
Crestor discontinued secondary potential musculoskeletal side effects. Patient tolerated simvastatin in the past. However she would like to wait 3-6 months before we be try statin therapy.

## 2012-11-13 ENCOUNTER — Other Ambulatory Visit: Payer: Self-pay | Admitting: *Deleted

## 2012-11-13 NOTE — Progress Notes (Signed)
Pt informed and will take 2 allopurinol

## 2012-11-21 ENCOUNTER — Telehealth: Payer: Self-pay | Admitting: Internal Medicine

## 2012-11-21 NOTE — Telephone Encounter (Signed)
Christina Mckinney,  [name]'s ULORIC 40mg  once daily was finally approved by [employer] (Prior Auth). It is approved for 1 year. Thanks!

## 2012-12-08 ENCOUNTER — Encounter: Payer: Self-pay | Admitting: Gastroenterology

## 2012-12-08 ENCOUNTER — Ambulatory Visit (INDEPENDENT_AMBULATORY_CARE_PROVIDER_SITE_OTHER): Payer: Medicare Other | Admitting: Gastroenterology

## 2012-12-08 VITALS — BP 110/62 | HR 80

## 2012-12-08 DIAGNOSIS — K5900 Constipation, unspecified: Secondary | ICD-10-CM

## 2012-12-08 DIAGNOSIS — K219 Gastro-esophageal reflux disease without esophagitis: Secondary | ICD-10-CM

## 2012-12-08 NOTE — Patient Instructions (Addendum)
Try to take the PPI (antiacid) twice daily.  The evening dose should be 20-30 min before dinner. Continue amitiza twice daily, but add daily fiber supplement (metamucil, citrucel, flax seed) to help your bowels. Call Dr. Christella Hartigan to report on your symptoms in 4-5 weeks after making the above changes.

## 2012-12-08 NOTE — Progress Notes (Signed)
Review of pertinent gastrointestinal problems:  1. GERD: EGD 2010 by Dr. Christella Hartigan found hiatal hernia, mild esophagitis, gastritis which was H. pylori negative on biopsies. Repeat EGD 6/12 at patients insistence: moderate gastritis, HH, H pylori neg.  Was recommended to double her PPI 2. history of tubular adenomas: Colonoscopy in [place] 2009 I believe that this procedure was incomplete due to tortuous colon, an 8 mm polyp was removed biopsies showed this was a tubulovillous adenoma. Usual interval of surveillance would be 3 years.  Colonoscopy 04/2011 found no polyps, + left diverticulosis; recommended no surveillance due to age   HPI: This is a  79 year old woman whom I last saw a year and half ago at the time of upper and lower endoscopies.  In past 1-2 years since EGD/colonoscopy in 2012.  Went to ER with back pains, constipation.  She underwent CT scan which I reviewed.  She also has had rolling sounds in belly.  She had burning in her chest, GERD like. She vomited in late night last night..  She takes PPI twice daily but forgot her evening dose last night.    Normally she takes PPI 30 min before breakfast.  Often forgets the PM PPI dose.  Her constipation improved some. She was put on amitiza, this has helped but not completely.  Milk of magnesia helps better, faster.    Past Medical History  Diagnosis Date  . Depressive disorder, not elsewhere classified   . Diverticulitis   . GERD (gastroesophageal reflux disease)   . Other and unspecified hyperlipidemia   . Unspecified essential hypertension   . Other abnormal glucose   . Unspecified hypothyroidism   . Personal history of colonic polyps   . Anxiety state, unspecified   . Personal history of peptic ulcer disease   . Osteoarthritis   . Gout, unspecified   . Essential and other specified forms of tremor   . Internal hemorrhoids without mention of complication   . Anemia, unspecified   . Unspecified disorder of bladder     . Pulmonary embolism   . DVT (deep venous thrombosis)   . Varicose veins     Past Surgical History  Procedure Date  . Cholecystectomy   . Abdominal hysterectomy   . Nasal septum surgery   . Shoulder surgery   . Knee arthroscopy     right   . Eye surgery   . Endovenous ablation saphenous vein w/ laser 09-11-2012    left greater saphenous vein   by Gretta Began MD    Current Outpatient Prescriptions  Medication Sig Dispense Refill  . allopurinol (ZYLOPRIM) 100 MG tablet Take 200 mg by mouth daily.      Marland Kitchen ALPRAZolam (XANAX) 0.25 MG tablet Take 1 tablet (0.25 mg total) by mouth 2 (two) times daily as needed.  60 tablet  5  . aspirin 81 MG tablet Take 1 tablet (81 mg total) by mouth daily.  30 tablet    . azelastine (ASTELIN) 137 MCG/SPRAY nasal spray Place 1 spray into the nose daily as needed. Use in each nostril as directed      . beclomethasone (QVAR) 80 MCG/ACT inhaler Inhale 1 puff into the lungs 2 (two) times daily.      . Cholecalciferol (VITAMIN D) 2000 UNITS tablet Take 2,000 Units by mouth daily.        Marland Kitchen co-enzyme Q-10 30 MG capsule Take 30 mg by mouth 3 (three) times daily.        Marland Kitchen darifenacin (ENABLEX) 15  MG 24 hr tablet Take 15 mg by mouth daily.      . diclofenac sodium (VOLTAREN) 1 % GEL Apply 1 application topically 4 (four) times daily. Apply 2 grams qid.  2 Tube  2  . docusate sodium (COLACE) 100 MG capsule Take 1 capsule (100 mg total) by mouth every 12 (twelve) hours.  60 capsule  0  . fluticasone (FLONASE) 50 MCG/ACT nasal spray Place 2 sprays into the nose daily. Allergies      . furosemide (LASIX) 40 MG tablet Take 1 tablet (40 mg total) by mouth daily. As directed.  90 tablet  1  . gabapentin (NEURONTIN) 300 MG capsule Take 1 capsule (300 mg total) by mouth daily.  90 capsule  1  . Glucosamine-Chondroitin 500-250 MG CAPS Take 1 capsule by mouth at bedtime.       Marland Kitchen HYDROcodone-acetaminophen (NORCO/VICODIN) 5-325 MG per tablet Take 2 tablets by mouth every 6  (six) hours as needed for pain.  90 tablet  0  . ibuprofen (ADVIL,MOTRIN) 200 MG tablet Take 2 tablets (400 mg total) by mouth every 8 (eight) hours as needed for pain.  30 tablet  0  . lansoprazole (PREVACID) 30 MG capsule Take 1 capsule (30 mg total) by mouth 2 (two) times daily.  60 capsule  0  . levothyroxine (SYNTHROID, LEVOTHROID) 50 MCG tablet Take 1 tablet (50 mcg total) by mouth daily.  90 tablet  1  . losartan (COZAAR) 100 MG tablet Take 100 mg by mouth daily.      Marland Kitchen lubiprostone (AMITIZA) 8 MCG capsule Take 1 capsule (8 mcg total) by mouth 2 (two) times daily with a meal.  60 capsule  5  . methocarbamol (ROBAXIN) 500 MG tablet Take 1 tablet (500 mg total) by mouth 2 (two) times daily as needed (pain).  30 tablet  1  . metoprolol succinate (TOPROL-XL) 50 MG 24 hr tablet Take 1 tablet (50 mg total) by mouth daily. Take with or immediately following a meal.  90 tablet  1  . rOPINIRole (REQUIP) 1 MG tablet TAKE 1 TABLET AT BEDTIME  90 tablet  0  . [DISCONTINUED] propranolol (INDERAL) 60 MG tablet Take 1 tablet (60 mg total) by mouth 2 (two) times daily.  60 tablet  3    Allergies as of 12/08/2012 - Review Complete 12/08/2012  Allergen Reaction Noted  . Crestor (rosuvastatin)  08/11/2012  . Lipitor (atorvastatin)  10/01/2012    Family History  Problem Relation Age of Onset  . Colon cancer Neg Hx   . Other Mother     varicose veins  . Heart disease Father   . Hyperlipidemia Father   . Heart attack Father   . Diabetes Daughter   . Heart disease Daughter   . Hyperlipidemia Daughter   . Hypertension Daughter     History   Social History  . Marital Status: Widowed    Spouse Name: N/A    Number of Children: 3  . Years of Education: N/A   Occupational History  . Retired     Social History Main Topics  . Smoking status: Former Smoker    Types: Cigarettes    Quit date: 11/12/1988  . Smokeless tobacco: Never Used  . Alcohol Use: 2.0 oz/week    4 drink(s) per week      Comment: occasional wine   . Drug Use: No  . Sexually Active: Not on file   Other Topics Concern  . Not on file  Social History Narrative  . No narrative on file      Physical Exam: BP 110/62  Pulse 80 Constitutional: generally well-appearing Psychiatric: alert and oriented x3 Abdomen: soft, nontender, nondistended, no obvious ascites, no peritoneal signs, normal bowel sounds     Assessment and plan: 79 y.o. female with GERD, chronic constipation  She is not taking her proton pump inhibitors at the current time in relation to meals and I recommended she change that. I made the same recommendation to her about 2 years ago at the time of upper endoscopies. She is going to add fiber supplement to her twice daily and Amitiza dosing. She will call to report on her symptoms and 4-5 weeks and sooner if needed.

## 2012-12-09 ENCOUNTER — Telehealth: Payer: Self-pay | Admitting: Internal Medicine

## 2012-12-09 MED ORDER — LUBIPROSTONE 8 MCG PO CAPS: 8.0000 ug | ORAL_CAPSULE | Freq: Two times a day (BID) | ORAL | Status: DC

## 2012-12-09 NOTE — Telephone Encounter (Signed)
rx sent in electronically 

## 2012-12-09 NOTE — Telephone Encounter (Signed)
Prescription request for 90 day supply  amitiza caps. Qty 90 days supply. Refills 4

## 2012-12-14 ENCOUNTER — Other Ambulatory Visit: Payer: Self-pay | Admitting: Internal Medicine

## 2013-01-05 ENCOUNTER — Ambulatory Visit (INDEPENDENT_AMBULATORY_CARE_PROVIDER_SITE_OTHER): Payer: Medicare Other | Admitting: Family Medicine

## 2013-01-05 ENCOUNTER — Telehealth: Payer: Self-pay | Admitting: Internal Medicine

## 2013-01-05 ENCOUNTER — Encounter: Payer: Self-pay | Admitting: Family Medicine

## 2013-01-05 VITALS — BP 124/70 | HR 93 | Temp 98.5°F

## 2013-01-05 DIAGNOSIS — K219 Gastro-esophageal reflux disease without esophagitis: Secondary | ICD-10-CM | POA: Diagnosis not present

## 2013-01-05 MED ORDER — LANSOPRAZOLE 30 MG PO CPDR: 30.0000 mg | DELAYED_RELEASE_CAPSULE | Freq: Two times a day (BID) | ORAL | Status: DC

## 2013-01-05 MED ORDER — PREDNISONE 20 MG PO TABS: ORAL_TABLET | ORAL | Status: DC

## 2013-01-05 MED ORDER — AZITHROMYCIN 250 MG PO TABS: ORAL_TABLET | ORAL | Status: DC

## 2013-01-05 NOTE — Telephone Encounter (Signed)
Patient Information:  Caller Name: Lawrence  Phone: [phone]  Patient: Christina Mckinney, Christina Mckinney  Gender: Female  DOB: Oct 01, 1932  Age: 79 Years  PCP: Artist Pais Doe-Hyun [name]) (Adults only)  Office Follow Up:  Does the office need to follow up with this patient?: No  Instructions For The Office: N/A  RN Note:  All emergent symptoms ruled our per Cough protocol. Next available appt scheduled w/ Dr Selena Batten at 1415 on 2-24.  Dr Artist Pais is not available.  Pt verbalized understanding.  Symptoms  Reason For Call & Symptoms: ER CALL. Cough w/ Green to brown sputum  Reviewed Health History In EMR: N/A  Reviewed Medications In EMR: N/A  Reviewed Allergies In EMR: N/A  Reviewed Surgeries / Procedures: N/A  Date of Onset of Symptoms: 12/29/2012  Treatments Tried: Mucines DM  Treatments Tried Worked: No  Guideline(s) Used:  Cough  Disposition Per Guideline:   See Today in Office  Reason For Disposition Reached:   Sinus pain persists after using nasal washes and pain medicine > 24 hours  Advice Given:  N/A  Appointment Scheduled:  01/05/2013 14:15:42 Appointment Scheduled Provider:  Kriste Basque (Family Practice)

## 2013-01-05 NOTE — Progress Notes (Signed)
Chief Complaint  Patient presents with  . Cough    wheezing, and sob, mucus brownish and green     HPI:  Acute visit for cough and congestion: -started: started about 1 week ago feeling worse -symptoms:nasal congestion, sore throat, cough, hoarse voice, body aches, wheezing, mild SOB with increased sputum production -denies:fever, NVD, tooth pain -has tried: musinex helps some -sick contacts: friend had the flu -Hx of: PNA, on qvar chronically for ? Asthma per her report - reports can take zpak  GERD: -wants refill on PPI -has been taking bid for a long time - almost out -symptoms controlled on medication  ROS: See pertinent positives and negatives per HPI.  Past Medical History  Diagnosis Date  . Depressive disorder, not elsewhere classified   . Diverticulitis   . GERD (gastroesophageal reflux disease)   . Other and unspecified hyperlipidemia   . Unspecified essential hypertension   . Other abnormal glucose   . Unspecified hypothyroidism   . Personal history of colonic polyps   . Anxiety state, unspecified   . Personal history of peptic ulcer disease   . Osteoarthritis   . Gout, unspecified   . Essential and other specified forms of tremor   . Internal hemorrhoids without mention of complication   . Anemia, unspecified   . Unspecified disorder of bladder   . Pulmonary embolism   . DVT (deep venous thrombosis)   . Varicose veins     Family History  Problem Relation Age of Onset  . Colon cancer Neg Hx   . Other Mother     varicose veins  . Heart disease Father   . Hyperlipidemia Father   . Heart attack Father   . Diabetes Daughter   . Heart disease Daughter   . Hyperlipidemia Daughter   . Hypertension Daughter     History   Social History  . Marital Status: Widowed    Spouse Name: N/A    Number of Children: 3  . Years of Education: N/A   Occupational History  . Retired     Social History Main Topics  . Smoking status: Former Smoker    Types:  Cigarettes    Quit date: 11/12/1988  . Smokeless tobacco: Never Used  . Alcohol Use: 2.0 oz/week    4 drink(s) per week     Comment: occasional wine   . Drug Use: No  . Sexually Active: None   Other Topics Concern  . None   Social History Narrative  . None    Current outpatient prescriptions:allopurinol (ZYLOPRIM) 100 MG tablet, Take 200 mg by mouth daily., Disp: , Rfl: ;  ALPRAZolam (XANAX) 0.25 MG tablet, Take 1 tablet (0.25 mg total) by mouth 2 (two) times daily as needed., Disp: 60 tablet, Rfl: 5;  aspirin 81 MG tablet, Take 1 tablet (81 mg total) by mouth daily., Disp: 30 tablet, Rfl:  azelastine (ASTELIN) 137 MCG/SPRAY nasal spray, Place 1 spray into the nose daily as needed. Use in each nostril as directed, Disp: , Rfl: ;  beclomethasone (QVAR) 80 MCG/ACT inhaler, Inhale 1 puff into the lungs 2 (two) times daily., Disp: , Rfl: ;  Cholecalciferol (VITAMIN D) 2000 UNITS tablet, Take 2,000 Units by mouth daily.  , Disp: , Rfl: ;  co-enzyme Q-10 30 MG capsule, Take 30 mg by mouth 3 (three) times daily.  , Disp: , Rfl:  darifenacin (ENABLEX) 15 MG 24 hr tablet, Take 15 mg by mouth daily., Disp: , Rfl: ;  diclofenac sodium (  VOLTAREN) 1 % GEL, Apply 1 application topically 4 (four) times daily. Apply 2 grams qid., Disp: 2 Tube, Rfl: 2;  docusate sodium (COLACE) 100 MG capsule, Take 1 capsule (100 mg total) by mouth every 12 (twelve) hours., Disp: 60 capsule, Rfl: 0 fluticasone (FLONASE) 50 MCG/ACT nasal spray, Place 2 sprays into the nose daily. Allergies, Disp: , Rfl: ;  furosemide (LASIX) 40 MG tablet, Take 1 tablet (40 mg total) by mouth daily. As directed., Disp: 90 tablet, Rfl: 1;  gabapentin (NEURONTIN) 300 MG capsule, Take 1 capsule (300 mg total) by mouth daily., Disp: 90 capsule, Rfl: 1;  Glucosamine-Chondroitin 500-250 MG CAPS, Take 1 capsule by mouth at bedtime. , Disp: , Rfl:  HYDROcodone-acetaminophen (NORCO/VICODIN) 5-325 MG per tablet, Take 2 tablets by mouth every 6 (six) hours as  needed for pain., Disp: 90 tablet, Rfl: 0;  ibuprofen (ADVIL,MOTRIN) 200 MG tablet, Take 2 tablets (400 mg total) by mouth every 8 (eight) hours as needed for pain., Disp: 30 tablet, Rfl: 0;  lansoprazole (PREVACID) 30 MG capsule, Take 1 capsule (30 mg total) by mouth 2 (two) times daily., Disp: 60 capsule, Rfl: 0 levothyroxine (SYNTHROID, LEVOTHROID) 50 MCG tablet, Take 1 tablet (50 mcg total) by mouth daily., Disp: 90 tablet, Rfl: 1;  losartan (COZAAR) 100 MG tablet, Take 100 mg by mouth daily., Disp: , Rfl: ;  lubiprostone (AMITIZA) 8 MCG capsule, Take 1 capsule (8 mcg total) by mouth 2 (two) times daily with a meal., Disp: 180 capsule, Rfl: 1 methocarbamol (ROBAXIN) 500 MG tablet, Take 1 tablet (500 mg total) by mouth 2 (two) times daily as needed (pain)., Disp: 30 tablet, Rfl: 1;  metoprolol succinate (TOPROL-XL) 50 MG 24 hr tablet, Take 1 tablet (50 mg total) by mouth daily. Take with or immediately following a meal., Disp: 90 tablet, Rfl: 1;  oxybutynin (DITROPAN-XL) 10 MG 24 hr tablet, TAKE 1 TABLET DAILY, Disp: 90 tablet, Rfl: 0 rOPINIRole (REQUIP) 1 MG tablet, TAKE 1 TABLET AT BEDTIME, Disp: 90 tablet, Rfl: 0;  azithromycin (ZITHROMAX) 250 MG tablet, 2 tabs on the first day then one tab daily, Disp: 6 tablet, Rfl: 0;  predniSONE (DELTASONE) 20 MG tablet, 40mg  (2 tabs) daily for 5 days, Disp: 10 tablet, Rfl: 0;  [DISCONTINUED] propranolol (INDERAL) 60 MG tablet, Take 1 tablet (60 mg total) by mouth 2 (two) times daily., Disp: 60 tablet, Rfl: 3  EXAM:  Filed Vitals:   01/05/13 1427  BP: 124/70  Pulse: 93  Temp: 98.5 F (36.9 C)    Body mass index is 0.00 kg/(m^2).  GENERAL: vitals reviewed and listed above, alert, oriented, appears well hydrated and in no acute distress  HEENT: atraumatic, conjunttiva clear, no obvious abnormalities on inspection of external nose and ears, normal appearance of ear canals and TMs, clear nasal congestion, mild post oropharyngeal erythema with PND, no  tonsillar edema or exudate, no sinus TTP  NECK: no obvious masses on inspection  LUNGS: scattered exp wheezing with prolonged exp  CV: HRRR, no peripheral edema  MS: moves all extremities without noticeable abnormality  PSYCH: pleasant and cooperative, no obvious depression or anxiety  ASSESSMENT AND PLAN:  Discussed the following assessment and plan:  Acute bronchitis - Plan: predniSONE (DELTASONE) 20 MG tablet, azithromycin (ZITHROMAX) 250 MG tablet  GERD (gastroesophageal reflux disease) - Plan: lansoprazole (PREVACID) 30 MG capsule  -will tx as acute bronchitis per orders. Risks with abx and oral pred discussed. Return precautions discussed. -Patient advised to return or notify a doctor immediately  if symptoms worsen or persist or new concerns arise.  There are no Patient Instructions on file for this visit.   Kriste BasqueKIM, Ricca Melgarejo R.

## 2013-01-13 ENCOUNTER — Encounter: Payer: Self-pay | Admitting: Internal Medicine

## 2013-01-13 ENCOUNTER — Encounter: Payer: Self-pay | Admitting: Family Medicine

## 2013-01-13 ENCOUNTER — Ambulatory Visit (INDEPENDENT_AMBULATORY_CARE_PROVIDER_SITE_OTHER): Payer: Medicare Other | Admitting: Internal Medicine

## 2013-01-13 ENCOUNTER — Telehealth: Payer: Self-pay | Admitting: Internal Medicine

## 2013-01-13 VITALS — BP 138/70 | HR 75 | Temp 98.1°F

## 2013-01-13 DIAGNOSIS — K219 Gastro-esophageal reflux disease without esophagitis: Secondary | ICD-10-CM | POA: Diagnosis not present

## 2013-01-13 DIAGNOSIS — J40 Bronchitis, not specified as acute or chronic: Secondary | ICD-10-CM | POA: Insufficient documentation

## 2013-01-13 MED ORDER — LANSOPRAZOLE 30 MG PO CPDR: 30.0000 mg | DELAYED_RELEASE_CAPSULE | Freq: Two times a day (BID) | ORAL | Status: DC

## 2013-01-13 MED ORDER — LEVOFLOXACIN 500 MG PO TABS: 500.0000 mg | ORAL_TABLET | Freq: Every day | ORAL | Status: DC

## 2013-01-13 NOTE — Assessment & Plan Note (Addendum)
Patient was seen February 24 th  for acute bronchitis. Treatment failure with azithromycin and prednisone. Treat with Levaquin 500 mg once daily for 10 days. Patient given samples of Dulera. Patient understands to discontinue Qvar while taking Dulera -200/5.

## 2013-01-13 NOTE — Progress Notes (Signed)
error    This encounter was created in error - please disregard.

## 2013-01-13 NOTE — Telephone Encounter (Signed)
[facility] Triage Call Report Triage Record Num: [id number] Operator: Merlinda Frederick Patient Name: Christina Mckinney Call Date & Time: 01/12/2013 4:33:04PM Patient Phone: [phone] PCP: [name] Patient Gender: Female PCP Fax : [phone] Patient DOB: 11-Jul-1932 [name] Reason for Call: Caller: [name]/Patient; PCP: [name] [name]) (Adults only); CB#: [id number]; Call regarding Cough/Congestion; Onset 12/29/12, pt was seen in office on 01/05/13 and dx with Bronchitis, prescribed Zpack and Prednisone on 01/09/13. Afebrile, cough persists with occasional wheezing, take Spiriva as prescribed. Triage per Cough-Adult Guideline, see in 24 hr disposition for "new onset or worsening cough and asthma with increasing frequency of flare ups since last scheduled appt." Appt scheduled in [facility] for 01/13/13 at 1400 with Dr. Charlton Amor appt with Dr. Artist Pais and pt saw Dr. Selena Batten last). Advised care advice and call back parameters per guideline, verbalized understanding. Protocol(s) Used: Cough - Adult Recommended Outcome per Protocol: See [name] within 24 hours Reason for Outcome: New onset or worsening cough AND asthma with increasing frequency of flare-ups since last scheduled appointment Care Advice: ~ Call [name] if symptoms worsen or new symptoms develop. ~ Avoid any activity that produces symptoms until evaluated by [name]. Call EMS 911 if sudden worsening of breathing problems, struggling to breathe, high pitched noise when breathing in (stridor), unable to speak, grasping at throat, or panic/anxiety because of breathing problems. ~ ~ Use prescribed rescue medication (inhaler, nebulizer) as directed. 01/12/2013 4:49:19PM Page 1 of 1 [email or web address]

## 2013-01-13 NOTE — Progress Notes (Signed)
Subjective:    Patient ID: Christina Mckinney, female    DOB: 14-Jul-1932, 79 y.o.   MRN: [id number]  HPI  79 year old white female was seen on February 24th by Dr. Selena Batten with symptoms of acute bronchitis. She was treated with course of azithromycin and prednisone. Patient completed both medications and is still experiencing persistent cough and wheezing.  Review of Systems Negative for fever or shortness of breath  Past Medical History  Diagnosis Date  . Depressive disorder, not elsewhere classified   . Diverticulitis   . GERD (gastroesophageal reflux disease)   . Other and unspecified hyperlipidemia   . Unspecified essential hypertension   . Other abnormal glucose   . Unspecified hypothyroidism   . Personal history of colonic polyps   . Anxiety state, unspecified   . Personal history of peptic ulcer disease   . Osteoarthritis   . Gout, unspecified   . Essential and other specified forms of tremor   . Internal hemorrhoids without mention of complication   . Anemia, unspecified   . Unspecified disorder of bladder   . Pulmonary embolism   . DVT (deep venous thrombosis)   . Varicose veins     History   Social History  . Marital Status: Widowed    Spouse Name: N/A    Number of Children: 3  . Years of Education: N/A   Occupational History  . Retired     Social History Main Topics  . Smoking status: Former Smoker    Types: Cigarettes    Quit date: 11/12/1988  . Smokeless tobacco: Never Used  . Alcohol Use: 2.0 oz/week    4 drink(s) per week     Comment: occasional wine   . Drug Use: No  . Sexually Active: Not on file   Other Topics Concern  . Not on file   Social History Narrative  . No narrative on file    Past Surgical History  Procedure Laterality Date  . Cholecystectomy    . Abdominal hysterectomy    . Nasal septum surgery    . Shoulder surgery    . Knee arthroscopy      right   . Eye surgery    . Endovenous ablation saphenous vein w/ laser  09-11-2012     left greater saphenous vein   by Gretta Began MD    Family History  Problem Relation Age of Onset  . Colon cancer Neg Hx   . Other Mother     varicose veins  . Heart disease Father   . Hyperlipidemia Father   . Heart attack Father   . Diabetes Daughter   . Heart disease Daughter   . Hyperlipidemia Daughter   . Hypertension Daughter     Allergies  Allergen Reactions  . Crestor (Rosuvastatin)     Left hip pain  . Lipitor (Atorvastatin)     Pain in hips  . Red Dye     Current Outpatient Prescriptions on File Prior to Visit  Medication Sig Dispense Refill  . allopurinol (ZYLOPRIM) 100 MG tablet Take 200 mg by mouth daily.      Marland Kitchen ALPRAZolam (XANAX) 0.25 MG tablet Take 1 tablet (0.25 mg total) by mouth 2 (two) times daily as needed.  60 tablet  5  . aspirin 81 MG tablet Take 1 tablet (81 mg total) by mouth daily.  30 tablet    . azelastine (ASTELIN) 137 MCG/SPRAY nasal spray Place 1 spray into the nose daily as needed. Use in each  nostril as directed      . beclomethasone (QVAR) 80 MCG/ACT inhaler Inhale 1 puff into the lungs 2 (two) times daily.      . Cholecalciferol (VITAMIN D) 2000 UNITS tablet Take 2,000 Units by mouth daily.        Marland Kitchen co-enzyme Q-10 30 MG capsule Take 30 mg by mouth 3 (three) times daily.        Marland Kitchen darifenacin (ENABLEX) 15 MG 24 hr tablet Take 15 mg by mouth daily.      . diclofenac sodium (VOLTAREN) 1 % GEL Apply 1 application topically 4 (four) times daily. Apply 2 grams qid.  2 Tube  2  . docusate sodium (COLACE) 100 MG capsule Take 1 capsule (100 mg total) by mouth every 12 (twelve) hours.  60 capsule  0  . fluticasone (FLONASE) 50 MCG/ACT nasal spray Place 2 sprays into the nose daily. Allergies      . furosemide (LASIX) 40 MG tablet Take 1 tablet (40 mg total) by mouth daily. As directed.  90 tablet  1  . gabapentin (NEURONTIN) 300 MG capsule Take 1 capsule (300 mg total) by mouth daily.  90 capsule  1  . Glucosamine-Chondroitin 500-250 MG CAPS Take 1  capsule by mouth at bedtime.       Marland Kitchen HYDROcodone-acetaminophen (NORCO/VICODIN) 5-325 MG per tablet Take 2 tablets by mouth every 6 (six) hours as needed for pain.  90 tablet  0  . ibuprofen (ADVIL,MOTRIN) 200 MG tablet Take 2 tablets (400 mg total) by mouth every 8 (eight) hours as needed for pain.  30 tablet  0  . levothyroxine (SYNTHROID, LEVOTHROID) 50 MCG tablet Take 1 tablet (50 mcg total) by mouth daily.  90 tablet  1  . losartan (COZAAR) 100 MG tablet Take 100 mg by mouth daily.      Marland Kitchen lubiprostone (AMITIZA) 8 MCG capsule Take 1 capsule (8 mcg total) by mouth 2 (two) times daily with a meal.  180 capsule  1  . methocarbamol (ROBAXIN) 500 MG tablet Take 1 tablet (500 mg total) by mouth 2 (two) times daily as needed (pain).  30 tablet  1  . metoprolol succinate (TOPROL-XL) 50 MG 24 hr tablet Take 1 tablet (50 mg total) by mouth daily. Take with or immediately following a meal.  90 tablet  1  . oxybutynin (DITROPAN-XL) 10 MG 24 hr tablet TAKE 1 TABLET DAILY  90 tablet  0  . rOPINIRole (REQUIP) 1 MG tablet TAKE 1 TABLET AT BEDTIME  90 tablet  0  . [DISCONTINUED] propranolol (INDERAL) 60 MG tablet Take 1 tablet (60 mg total) by mouth 2 (two) times daily.  60 tablet  3   No current facility-administered medications on file prior to visit.    BP 138/70  Pulse 75  Temp(Src) 98.1 F (36.7 C) (Oral)  SpO2 97%       Objective:   Physical Exam  Constitutional: She is oriented to person, place, and time. She appears well-developed and well-nourished.  Cardiovascular: Normal rate, regular rhythm and normal heart sounds.   Pulmonary/Chest: Effort normal. She has wheezes.  Bilateral expiratory wheeze  Musculoskeletal: She exhibits no edema.  Neurological: She is alert and oriented to person, place, and time. No cranial nerve deficit.  Skin: Skin is warm and dry.  Psychiatric: She has a normal mood and affect. Her behavior is normal.          Assessment & Plan:

## 2013-01-14 ENCOUNTER — Telehealth: Payer: Self-pay | Admitting: Internal Medicine

## 2013-01-14 NOTE — Telephone Encounter (Signed)
[facility] Triage Call Report Triage Record Num: [id number] Operator: Lesli Albee Patient Name: Christina Mckinney Call Date & Time: 01/13/2013 6:54:12PM Patient Phone: [phone] PCP: [name] Patient Gender: Female PCP Fax : [phone] Patient DOB: 07-Jun-1932 [name] Reason for Call: Caller: [name]; PCP: [name] [name]) (Adults only); CB#: [phone]; Call regarding Medication Issue; Medication(s): Levaquin; [name] /pharmacist from [address] has the pt at the pharmacy saying that she saw the Dr. at the office today and had medication ordered. Pharmacist doesn't have the order. RN checked EPIC and saw that abx was ordered but sent to [facility]. RN gave the pharmacist the order: Levaquin 500mg  po QD x 10 days since pt needs to start it tonight. Protocol(s) Used: Medication Questions - Adult Recommended Outcome per Protocol: Speak with Provider or Pharmacist within 24 hours Reason for Outcome: Requests refill of prescribed medication with valid refills; lack of medications does not put patient at clinical risk Care Advice: ~ 03/

## 2013-01-27 ENCOUNTER — Ambulatory Visit (INDEPENDENT_AMBULATORY_CARE_PROVIDER_SITE_OTHER): Payer: Medicare Other | Admitting: Internal Medicine

## 2013-01-27 ENCOUNTER — Encounter: Payer: Self-pay | Admitting: Internal Medicine

## 2013-01-27 VITALS — BP 124/84 | HR 64 | Temp 98.2°F

## 2013-01-27 DIAGNOSIS — J40 Bronchitis, not specified as acute or chronic: Secondary | ICD-10-CM

## 2013-01-27 DIAGNOSIS — K219 Gastro-esophageal reflux disease without esophagitis: Secondary | ICD-10-CM | POA: Diagnosis not present

## 2013-01-27 NOTE — Assessment & Plan Note (Signed)
Resolved with 10 day course of Levaquin. Patient advised to restart Qvar. She understands to rinse her mouth after inhaler use. She is having occasional hoarseness which may be related to inhaled corticosteroid use.

## 2013-01-27 NOTE — Progress Notes (Signed)
Subjective:    Patient ID: Christina Mckinney, female    DOB: 1932-11-12, 79 y.o.   MRN: [id number]  HPI  79 year old white female previously seen for bronchitis for followup.  Patient reports coughing and wheezing resolved with finishing course of Levaquin. She did not have any side effects from Levaquin.  She is still having intermittent issues with epigastric pain. She was seen by Dr. Christella Hartigan in 12/08/2012. He did not feel a repeat EGD was necessary. She often forgets to take her evening dose of Prevacid. Patient somewhat concerned that she had episode where there was some blood with vomiting.  She is concerned she may have ulcers.  Review of Systems Negative for dysphagia.  No weight chanees  Past Medical History  Diagnosis Date  . Depressive disorder, not elsewhere classified   . Diverticulitis   . GERD (gastroesophageal reflux disease)   . Other and unspecified hyperlipidemia   . Unspecified essential hypertension   . Other abnormal glucose   . Unspecified hypothyroidism   . Personal history of colonic polyps   . Anxiety state, unspecified   . Personal history of peptic ulcer disease   . Osteoarthritis   . Gout, unspecified   . Essential and other specified forms of tremor   . Internal hemorrhoids without mention of complication   . Anemia, unspecified   . Unspecified disorder of bladder   . Pulmonary embolism   . DVT (deep venous thrombosis)   . Varicose veins     History   Social History  . Marital Status: Widowed    Spouse Name: N/A    Number of Children: 3  . Years of Education: N/A   Occupational History  . Retired     Social History Main Topics  . Smoking status: Former Smoker    Types: Cigarettes    Quit date: 11/12/1988  . Smokeless tobacco: Never Used  . Alcohol Use: 2.0 oz/week    4 drink(s) per week     Comment: occasional wine   . Drug Use: No  . Sexually Active: Not on file   Other Topics Concern  . Not on file   Social History Narrative  .  No narrative on file    Past Surgical History  Procedure Laterality Date  . Cholecystectomy    . Abdominal hysterectomy    . Nasal septum surgery    . Shoulder surgery    . Knee arthroscopy      right   . Eye surgery    . Endovenous ablation saphenous vein w/ laser  09-11-2012    left greater saphenous vein   by Gretta Began MD    Family History  Problem Relation Age of Onset  . Colon cancer Neg Hx   . Other Mother     varicose veins  . Heart disease Father   . Hyperlipidemia Father   . Heart attack Father   . Diabetes Daughter   . Heart disease Daughter   . Hyperlipidemia Daughter   . Hypertension Daughter     Allergies  Allergen Reactions  . Crestor (Rosuvastatin)     Left hip pain  . Lipitor (Atorvastatin)     Pain in hips  . Red Dye     Current Outpatient Prescriptions on File Prior to Visit  Medication Sig Dispense Refill  . allopurinol (ZYLOPRIM) 100 MG tablet Take 200 mg by mouth daily.      Marland Kitchen ALPRAZolam (XANAX) 0.25 MG tablet Take 1 tablet (0.25 mg total) by  mouth 2 (two) times daily as needed.  60 tablet  5  . aspirin 81 MG tablet Take 1 tablet (81 mg total) by mouth daily.  30 tablet    . azelastine (ASTELIN) 137 MCG/SPRAY nasal spray Place 1 spray into the nose daily as needed. Use in each nostril as directed      . beclomethasone (QVAR) 80 MCG/ACT inhaler Inhale 1 puff into the lungs 2 (two) times daily.      . Cholecalciferol (VITAMIN D) 2000 UNITS tablet Take 2,000 Units by mouth daily.        Marland Kitchen co-enzyme Q-10 30 MG capsule Take 30 mg by mouth 3 (three) times daily.        Marland Kitchen darifenacin (ENABLEX) 15 MG 24 hr tablet Take 15 mg by mouth daily.      . diclofenac sodium (VOLTAREN) 1 % GEL Apply 1 application topically 4 (four) times daily. Apply 2 grams qid.  2 Tube  2  . docusate sodium (COLACE) 100 MG capsule Take 1 capsule (100 mg total) by mouth every 12 (twelve) hours.  60 capsule  0  . fluticasone (FLONASE) 50 MCG/ACT nasal spray Place 2 sprays into  the nose daily. Allergies      . furosemide (LASIX) 40 MG tablet Take 1 tablet (40 mg total) by mouth daily. As directed.  90 tablet  1  . gabapentin (NEURONTIN) 300 MG capsule Take 1 capsule (300 mg total) by mouth daily.  90 capsule  1  . Glucosamine-Chondroitin 500-250 MG CAPS Take 1 capsule by mouth at bedtime.       Marland Kitchen HYDROcodone-acetaminophen (NORCO/VICODIN) 5-325 MG per tablet Take 2 tablets by mouth every 6 (six) hours as needed for pain.  90 tablet  0  . ibuprofen (ADVIL,MOTRIN) 200 MG tablet Take 2 tablets (400 mg total) by mouth every 8 (eight) hours as needed for pain.  30 tablet  0  . lansoprazole (PREVACID) 30 MG capsule Take 1 capsule (30 mg total) by mouth 2 (two) times daily.  180 capsule  1  . levothyroxine (SYNTHROID, LEVOTHROID) 50 MCG tablet Take 1 tablet (50 mcg total) by mouth daily.  90 tablet  1  . losartan (COZAAR) 100 MG tablet Take 100 mg by mouth daily.      Marland Kitchen lubiprostone (AMITIZA) 8 MCG capsule Take 1 capsule (8 mcg total) by mouth 2 (two) times daily with a meal.  180 capsule  1  . methocarbamol (ROBAXIN) 500 MG tablet Take 1 tablet (500 mg total) by mouth 2 (two) times daily as needed (pain).  30 tablet  1  . metoprolol succinate (TOPROL-XL) 50 MG 24 hr tablet Take 1 tablet (50 mg total) by mouth daily. Take with or immediately following a meal.  90 tablet  1  . oxybutynin (DITROPAN-XL) 10 MG 24 hr tablet TAKE 1 TABLET DAILY  90 tablet  0  . rOPINIRole (REQUIP) 1 MG tablet TAKE 1 TABLET AT BEDTIME  90 tablet  0  . [DISCONTINUED] propranolol (INDERAL) 60 MG tablet Take 1 tablet (60 mg total) by mouth 2 (two) times daily.  60 tablet  3   No current facility-administered medications on file prior to visit.    BP 124/84  Pulse 64  Temp(Src) 98.2 F (36.8 C) (Oral)       Objective:   Physical Exam  Constitutional: She is oriented to person, place, and time. She appears well-developed and well-nourished.  HENT:  Head: Normocephalic and atraumatic.   Mouth/Throat: Oropharynx is  clear and moist.  Cardiovascular: Normal rate, regular rhythm and normal heart sounds.   No murmur heard. Pulmonary/Chest: Effort normal and breath sounds normal. She has no wheezes.  Neurological: She is alert and oriented to person, place, and time. No cranial nerve deficit.  Psychiatric: She has a normal mood and affect. Her behavior is normal.          Assessment & Plan:

## 2013-01-27 NOTE — Assessment & Plan Note (Signed)
Patient having intermittent left hip pain. Left hip bursa was injected in the past without any improvement. Patient advised to followup with orthopedic specialists at [facility]-Dr. Daphine Deutscher.

## 2013-01-27 NOTE — Assessment & Plan Note (Addendum)
Patient seen by Christella Hartigan 2 months ago. She Completed EGD 2 years ago. It was negative for H. Pylori. Patient concerned that she may have ulcers due to small amount of blood after vomiting. Check barium swallow.  Continue PPI twice daily.

## 2013-01-28 ENCOUNTER — Other Ambulatory Visit: Payer: Self-pay | Admitting: Internal Medicine

## 2013-02-03 ENCOUNTER — Ambulatory Visit (HOSPITAL_COMMUNITY)
Admission: RE | Admit: 2013-02-03 | Discharge: 2013-02-03 | Disposition: A | Discharge status: Home / Self Care | Payer: Medicare Other | Source: Ambulatory Visit | Attending: Internal Medicine | Admitting: Internal Medicine

## 2013-02-03 DIAGNOSIS — R1310 Dysphagia, unspecified: Secondary | ICD-10-CM | POA: Insufficient documentation

## 2013-02-03 DIAGNOSIS — K224 Dyskinesia of esophagus: Secondary | ICD-10-CM | POA: Diagnosis not present

## 2013-02-03 DIAGNOSIS — K449 Diaphragmatic hernia without obstruction or gangrene: Secondary | ICD-10-CM | POA: Diagnosis not present

## 2013-02-03 DIAGNOSIS — G8929 Other chronic pain: Secondary | ICD-10-CM

## 2013-02-04 ENCOUNTER — Telehealth: Payer: Self-pay | Admitting: Internal Medicine

## 2013-02-04 ENCOUNTER — Encounter: Payer: Self-pay | Admitting: Internal Medicine

## 2013-02-04 ENCOUNTER — Ambulatory Visit (INDEPENDENT_AMBULATORY_CARE_PROVIDER_SITE_OTHER): Payer: Medicare Other | Admitting: Internal Medicine

## 2013-02-04 VITALS — BP 132/68 | HR 68 | Temp 97.8°F

## 2013-02-04 DIAGNOSIS — H8110 Benign paroxysmal vertigo, unspecified ear: Secondary | ICD-10-CM

## 2013-02-04 MED ORDER — DIAZEPAM 2 MG PO TABS: 2.0000 mg | ORAL_TABLET | Freq: Three times a day (TID) | ORAL | Status: DC | PRN

## 2013-02-04 NOTE — Assessment & Plan Note (Signed)
Patient experiencing exacerbation of benign positional vertigo. Performed Dix-Hallpike maneuver with improvement in her symptoms. Reviewed home exercises. Use diazepam 2 mg tid prn for acute symptoms.

## 2013-02-04 NOTE — Telephone Encounter (Signed)
FYI/ appt today

## 2013-02-04 NOTE — Patient Instructions (Signed)
Perform vestibular exercises as directed

## 2013-02-04 NOTE — Progress Notes (Signed)
Subjective:    Patient ID: Christina Mckinney, female    DOB: 11-24-1931, 79 y.o.   MRN: [id number]  HPI  79 year old white female with history of hypertension, anxiety and restless leg syndrome complains of intermittent vertigo. She has had similar symptoms in the past. Her symptoms triggered by getting out of bed in the morning and turning her head to the right. The symptoms also worse with looking up or down. She has associated nausea.  There is question of possible tear discharge. She denies ear pain or fever.  Review of Systems Negative for headaches  Past Medical History  Diagnosis Date  . Depressive disorder, not elsewhere classified   . Diverticulitis   . GERD (gastroesophageal reflux disease)   . Other and unspecified hyperlipidemia   . Unspecified essential hypertension   . Other abnormal glucose   . Unspecified hypothyroidism   . Personal history of colonic polyps   . Anxiety state, unspecified   . Personal history of peptic ulcer disease   . Osteoarthritis   . Gout, unspecified   . Essential and other specified forms of tremor   . Internal hemorrhoids without mention of complication   . Anemia, unspecified   . Unspecified disorder of bladder   . Pulmonary embolism   . DVT (deep venous thrombosis)   . Varicose veins     History   Social History  . Marital Status: Widowed    Spouse Name: N/A    Number of Children: 3  . Years of Education: N/A   Occupational History  . Retired     Social History Main Topics  . Smoking status: Former Smoker    Types: Cigarettes    Quit date: 11/12/1988  . Smokeless tobacco: Never Used  . Alcohol Use: 2.0 oz/week    4 drink(s) per week     Comment: occasional wine   . Drug Use: No  . Sexually Active: Not on file   Other Topics Concern  . Not on file   Social History Narrative  . No narrative on file    Past Surgical History  Procedure Laterality Date  . Cholecystectomy    . Abdominal hysterectomy    . Nasal  septum surgery    . Shoulder surgery    . Knee arthroscopy      right   . Eye surgery    . Endovenous ablation saphenous vein w/ laser  09-11-2012    left greater saphenous vein   by Gretta Began MD    Family History  Problem Relation Age of Onset  . Colon cancer Neg Hx   . Other Mother     varicose veins  . Heart disease Father   . Hyperlipidemia Father   . Heart attack Father   . Diabetes Daughter   . Heart disease Daughter   . Hyperlipidemia Daughter   . Hypertension Daughter     Allergies  Allergen Reactions  . Crestor (Rosuvastatin)     Left hip pain  . Lipitor (Atorvastatin)     Pain in hips  . Red Dye     Current Outpatient Prescriptions on File Prior to Visit  Medication Sig Dispense Refill  . allopurinol (ZYLOPRIM) 100 MG tablet Take 200 mg by mouth daily.      Marland Kitchen ALPRAZolam (XANAX) 0.25 MG tablet Take 1 tablet (0.25 mg total) by mouth 2 (two) times daily as needed.  60 tablet  5  . aspirin 81 MG tablet Take 1 tablet (81 mg total)  by mouth daily.  30 tablet    . azelastine (ASTELIN) 137 MCG/SPRAY nasal spray Place 1 spray into the nose daily as needed. Use in each nostril as directed      . beclomethasone (QVAR) 80 MCG/ACT inhaler Inhale 1 puff into the lungs 2 (two) times daily.      . Cholecalciferol (VITAMIN D) 2000 UNITS tablet Take 2,000 Units by mouth daily.        Marland Kitchen co-enzyme Q-10 30 MG capsule Take 30 mg by mouth 3 (three) times daily.        Marland Kitchen darifenacin (ENABLEX) 15 MG 24 hr tablet Take 15 mg by mouth daily.      . diclofenac sodium (VOLTAREN) 1 % GEL Apply 1 application topically 4 (four) times daily. Apply 2 grams qid.  2 Tube  2  . docusate sodium (COLACE) 100 MG capsule Take 1 capsule (100 mg total) by mouth every 12 (twelve) hours.  60 capsule  0  . fluticasone (FLONASE) 50 MCG/ACT nasal spray Place 2 sprays into the nose daily. Allergies      . furosemide (LASIX) 40 MG tablet Take 1 tablet (40 mg total) by mouth daily. As directed.  90 tablet  1  .  gabapentin (NEURONTIN) 300 MG capsule Take 1 capsule (300 mg total) by mouth daily.  90 capsule  1  . Glucosamine-Chondroitin 500-250 MG CAPS Take 1 capsule by mouth at bedtime.       Marland Kitchen HYDROcodone-acetaminophen (NORCO/VICODIN) 5-325 MG per tablet Take 2 tablets by mouth every 6 (six) hours as needed for pain.  90 tablet  0  . ibuprofen (ADVIL,MOTRIN) 200 MG tablet Take 2 tablets (400 mg total) by mouth every 8 (eight) hours as needed for pain.  30 tablet  0  . lansoprazole (PREVACID) 30 MG capsule Take 1 capsule (30 mg total) by mouth 2 (two) times daily.  180 capsule  1  . levothyroxine (SYNTHROID, LEVOTHROID) 50 MCG tablet Take 1 tablet (50 mcg total) by mouth daily.  90 tablet  1  . losartan (COZAAR) 100 MG tablet Take 100 mg by mouth daily.      Marland Kitchen lubiprostone (AMITIZA) 8 MCG capsule Take 1 capsule (8 mcg total) by mouth 2 (two) times daily with a meal.  180 capsule  1  . methocarbamol (ROBAXIN) 500 MG tablet Take 1 tablet (500 mg total) by mouth 2 (two) times daily as needed (pain).  30 tablet  1  . metoprolol succinate (TOPROL-XL) 50 MG 24 hr tablet Take 1 tablet (50 mg total) by mouth daily. Take with or immediately following a meal.  90 tablet  1  . rOPINIRole (REQUIP) 1 MG tablet TAKE 1 TABLET AT BEDTIME  90 tablet  0  . [DISCONTINUED] propranolol (INDERAL) 60 MG tablet Take 1 tablet (60 mg total) by mouth 2 (two) times daily.  60 tablet  3   No current facility-administered medications on file prior to visit.    BP 132/68  Pulse 68  Temp(Src) 97.8 F (36.6 C) (Oral)  SpO2 96%       Objective:   Physical Exam  Constitutional: She is oriented to person, place, and time. She appears well-developed and well-nourished.  HENT:  Head: Normocephalic and atraumatic.  Right Ear: External ear normal.  Left Ear: External ear normal.  Mouth/Throat: Oropharynx is clear and moist.  Eyes: Conjunctivae and EOM are normal. Pupils are equal, round, and reactive to light.  No nystagmus   Cardiovascular: Normal rate, regular rhythm  and normal heart sounds.   Pulmonary/Chest: Effort normal and breath sounds normal.  Neurological: She is alert and oriented to person, place, and time. No cranial nerve deficit.  Psychiatric: She has a normal mood and affect. Her behavior is normal.          Assessment & Plan:

## 2013-02-04 NOTE — Telephone Encounter (Signed)
Patient Information:  Caller Name: Gerald  Phone: [phone]  Patient: Christina Mckinney, Christina Mckinney  Gender: Female  DOB: [birth year]  Age: 79 Years  PCP: Artist Pais Doe-Hyun [name]) (Adults only)  Office Follow Up:  Does the office need to follow up with this patient?: No  Instructions For The Office: N/A  RN Note:  Requests appointment in the office.  Symptoms  Reason For Call & Symptoms: Started with Vertigo on 01/29/13. Concerned she is going fall. She has been having increased trouble hearing and had drainage from L ear on 02/01/13.  Endoscopy on 02/03/13 d/t frequent vomiting. Stomach symtoms better for past week. Voiding QS. She has had episodes of Vertigo in the past- last time was 2 years ago. Still hoarse and occasional cough from recent Bronchitis. Refusing ER-niece helping with walking and going for appointments.    Reviewed Health History In EMR: Yes  Reviewed Medications In EMR: Yes  Reviewed Allergies In EMR: Yes  Reviewed Surgeries / Procedures: Yes  Date of Onset of Symptoms: 01/29/2013  Guideline(s) Used:  Dizziness  Disposition Per Guideline:   Go to ED Now (or to Office with PCP Approval)  Reason For Disposition Reached:   Severe dizziness (e.g., unable to stand, requires support to walk, feels like passing out now)  Advice Given: Assistance with walking  Drink Fluids:  Drink several glasses of fruit juice, other clear fluids, or water. This will improve hydration and blood glucose. If you have a fever or have had heat exposure, make sure the fluids are cold.  Stand Up Slowly:  In the mornings, sit up for a few minutes before you stand up. That will help your blood flow make the adjustment.  If you have to stand up for long periods of time, contract and relax your leg muscles to help pump the blood back to the heart.  Sit down or lie down if you feel dizzy.  Call Back If:  Still feel dizzy after 2 hours of rest and fluids  Passes out (faints)  You become worse.  Patient Will  Follow Care Advice:  YES  Appointment Scheduled:  02/04/2013 14:00:00 Appointment Scheduled Provider:  Artist Pais, Doe-Hyun [name]) (Adults only)

## 2013-02-05 ENCOUNTER — Other Ambulatory Visit: Payer: Self-pay | Admitting: Internal Medicine

## 2013-02-06 ENCOUNTER — Other Ambulatory Visit: Payer: Self-pay | Admitting: Internal Medicine

## 2013-03-04 ENCOUNTER — Ambulatory Visit: Payer: Medicare Other | Admitting: Internal Medicine

## 2013-03-12 ENCOUNTER — Ambulatory Visit (INDEPENDENT_AMBULATORY_CARE_PROVIDER_SITE_OTHER): Payer: Medicare Other | Admitting: Internal Medicine

## 2013-03-12 VITALS — BP 124/60 | Temp 98.0°F

## 2013-03-12 DIAGNOSIS — H8110 Benign paroxysmal vertigo, unspecified ear: Secondary | ICD-10-CM

## 2013-03-12 DIAGNOSIS — M629 Disorder of muscle, unspecified: Secondary | ICD-10-CM | POA: Diagnosis not present

## 2013-03-12 DIAGNOSIS — IMO0002 Reserved for concepts with insufficient information to code with codable children: Secondary | ICD-10-CM

## 2013-03-12 DIAGNOSIS — M7632 Iliotibial band syndrome, left leg: Secondary | ICD-10-CM | POA: Insufficient documentation

## 2013-03-12 MED ORDER — PREDNISONE 10 MG PO TABS: ORAL_TABLET | ORAL | Status: DC

## 2013-03-12 NOTE — Assessment & Plan Note (Signed)
Patient experiencing persistent symptoms. Refer to vestibular rehabilitation. Diazepam not helping. She is using over-the-counter meclizine.

## 2013-03-12 NOTE — Assessment & Plan Note (Signed)
Patient complains of pain along her iliotibial band on left side. She reports her symptoms started after using statin medication. I reviewed stretching exercises. Trial of short course of prednisone.

## 2013-03-12 NOTE — Progress Notes (Signed)
Subjective:    Patient ID: Christina Mckinney, female    DOB: 1932/02/24, 79 y.o.   MRN: [id number]  HPI  79 year old white female with history of hypertension, anxiety restless leg syndrome for followup regarding vertigo. She continues to have symptoms that are positional. Her symptoms are worse with laying down and turning her head.  She had some success with vestibular rehabilitation in the past. Diazepam is minimally effective.  Patient also complains of left hip pain. She plans to followup with her orthopedic physician. She also has intermittent left-sided back pain. She received cortisone injection in the past for possible hip bursitis. There was no improvement.   Review of Systems Negative for chest pain or shortness of breath  Past Medical History  Diagnosis Date  . Depressive disorder, not elsewhere classified   . Diverticulitis   . GERD (gastroesophageal reflux disease)   . Other and unspecified hyperlipidemia   . Unspecified essential hypertension   . Other abnormal glucose   . Unspecified hypothyroidism   . Personal history of colonic polyps   . Anxiety state, unspecified   . Personal history of peptic ulcer disease   . Osteoarthritis   . Gout, unspecified   . Essential and other specified forms of tremor   . Internal hemorrhoids without mention of complication   . Anemia, unspecified   . Unspecified disorder of bladder   . Pulmonary embolism   . DVT (deep venous thrombosis)   . Varicose veins     History   Social History  . Marital Status: Widowed    Spouse Name: N/A    Number of Children: 3  . Years of Education: N/A   Occupational History  . Retired     Social History Main Topics  . Smoking status: Former Smoker    Types: Cigarettes    Quit date: 11/12/1988  . Smokeless tobacco: Never Used  . Alcohol Use: 2.0 oz/week    4 drink(s) per week     Comment: occasional wine   . Drug Use: No  . Sexually Active: Not on file   Other Topics Concern  . Not  on file   Social History Narrative  . No narrative on file    Past Surgical History  Procedure Laterality Date  . Cholecystectomy    . Abdominal hysterectomy    . Nasal septum surgery    . Shoulder surgery    . Knee arthroscopy      right   . Eye surgery    . Endovenous ablation saphenous vein w/ laser  09-11-2012    left greater saphenous vein   by Gretta Began MD    Family History  Problem Relation Age of Onset  . Colon cancer Neg Hx   . Other Mother     varicose veins  . Heart disease Father   . Hyperlipidemia Father   . Heart attack Father   . Diabetes Daughter   . Heart disease Daughter   . Hyperlipidemia Daughter   . Hypertension Daughter     Allergies  Allergen Reactions  . Crestor (Rosuvastatin)     Left hip pain  . Lipitor (Atorvastatin)     Pain in hips  . Red Dye     Current Outpatient Prescriptions on File Prior to Visit  Medication Sig Dispense Refill  . allopurinol (ZYLOPRIM) 100 MG tablet Take 200 mg by mouth daily.      Marland Kitchen ALPRAZolam (XANAX) 0.25 MG tablet Take 1 tablet (0.25 mg total) by  mouth 2 (two) times daily as needed.  60 tablet  5  . aspirin 81 MG tablet Take 1 tablet (81 mg total) by mouth daily.  30 tablet    . azelastine (ASTELIN) 137 MCG/SPRAY nasal spray Place 1 spray into the nose daily as needed. Use in each nostril as directed      . Cholecalciferol (VITAMIN D) 2000 UNITS tablet Take 2,000 Units by mouth daily.        Marland Kitchen co-enzyme Q-10 30 MG capsule Take 30 mg by mouth 3 (three) times daily.        Marland Kitchen darifenacin (ENABLEX) 15 MG 24 hr tablet Take 15 mg by mouth daily.      . diazepam (VALIUM) 2 MG tablet Take 1 tablet (2 mg total) by mouth every 8 (eight) hours as needed (vertigo).  30 tablet  0  . diclofenac sodium (VOLTAREN) 1 % GEL Apply 1 application topically 4 (four) times daily. Apply 2 grams qid.  2 Tube  2  . docusate sodium (COLACE) 100 MG capsule Take 1 capsule (100 mg total) by mouth every 12 (twelve) hours.  60 capsule  0   . fluticasone (FLONASE) 50 MCG/ACT nasal spray Place 2 sprays into the nose daily. Allergies      . furosemide (LASIX) 40 MG tablet Take 1 tablet (40 mg total) by mouth daily. As directed.  90 tablet  1  . gabapentin (NEURONTIN) 300 MG capsule Take 1 capsule (300 mg total) by mouth daily.  90 capsule  1  . Glucosamine-Chondroitin 500-250 MG CAPS Take 1 capsule by mouth at bedtime.       Marland Kitchen HYDROcodone-acetaminophen (NORCO/VICODIN) 5-325 MG per tablet Take 2 tablets by mouth every 6 (six) hours as needed for pain.  90 tablet  0  . ibuprofen (ADVIL,MOTRIN) 200 MG tablet Take 2 tablets (400 mg total) by mouth every 8 (eight) hours as needed for pain.  30 tablet  0  . lansoprazole (PREVACID) 30 MG capsule Take 1 capsule (30 mg total) by mouth 2 (two) times daily.  180 capsule  1  . levothyroxine (SYNTHROID, LEVOTHROID) 50 MCG tablet Take 1 tablet (50 mcg total) by mouth daily.  90 tablet  1  . losartan (COZAAR) 100 MG tablet TAKE 1 TABLET DAILY  90 tablet  0  . lubiprostone (AMITIZA) 8 MCG capsule Take 1 capsule (8 mcg total) by mouth 2 (two) times daily with a meal.  180 capsule  1  . methocarbamol (ROBAXIN) 500 MG tablet Take 1 tablet (500 mg total) by mouth 2 (two) times daily as needed (pain).  30 tablet  1  . metoprolol succinate (TOPROL-XL) 50 MG 24 hr tablet Take 1 tablet (50 mg total) by mouth daily. Take with or immediately following a meal.  90 tablet  1  . QVAR 80 MCG/ACT inhaler INHALE 2 PUFFS INTO THE LUNGS 2 TIMES A DAY  3 g  1  . rOPINIRole (REQUIP) 1 MG tablet TAKE 1 TABLET AT BEDTIME  90 tablet  0  . [DISCONTINUED] propranolol (INDERAL) 60 MG tablet Take 1 tablet (60 mg total) by mouth 2 (two) times daily.  60 tablet  3   No current facility-administered medications on file prior to visit.    BP 124/60  Temp(Src) 98 F (36.7 C) (Oral)       Objective:   Physical Exam  Constitutional: She appears well-developed and well-nourished.  HENT:  Head: Normocephalic and atraumatic.   Right Ear: External ear normal.  Left Ear: External ear normal.  Mouth/Throat: Oropharynx is clear and moist.  Eyes: EOM are normal. Pupils are equal, round, and reactive to light.  Cardiovascular: Normal rate, regular rhythm and normal heart sounds.   Pulmonary/Chest: Effort normal and breath sounds normal. She has no wheezes.  Musculoskeletal:  Tenderness along left iliotibial band,  No pain with internal or external rotation of left hip  Psychiatric: She has a normal mood and affect. Her behavior is normal.          Assessment & Plan:

## 2013-03-16 ENCOUNTER — Telehealth: Payer: Self-pay | Admitting: Internal Medicine

## 2013-03-16 DIAGNOSIS — H8110 Benign paroxysmal vertigo, unspecified ear: Secondary | ICD-10-CM

## 2013-03-16 NOTE — Telephone Encounter (Signed)
Pt called and stated that she would like a referral to go see Dr. [name] (fax:[phone]). She would like to see him in regards to her Vertigo. Please assist.

## 2013-03-16 NOTE — Telephone Encounter (Signed)
Left message for pt to call back  °

## 2013-03-16 NOTE — Telephone Encounter (Signed)
Ok for referral. I am assuming he is ENT

## 2013-03-17 NOTE — Telephone Encounter (Signed)
Referral order placed.

## 2013-03-17 NOTE — Telephone Encounter (Signed)
Pt called back. States [name] is at [facility] at [place] [employer]? Ph#: [phone]; fax#: [phone].

## 2013-03-17 NOTE — Telephone Encounter (Signed)
Left message for pt to call back  °

## 2013-03-21 ENCOUNTER — Other Ambulatory Visit: Payer: Self-pay | Admitting: Internal Medicine

## 2013-03-24 DIAGNOSIS — R42 Dizziness and giddiness: Secondary | ICD-10-CM | POA: Diagnosis not present

## 2013-03-25 ENCOUNTER — Other Ambulatory Visit: Payer: Self-pay | Admitting: Internal Medicine

## 2013-04-01 DIAGNOSIS — R42 Dizziness and giddiness: Secondary | ICD-10-CM | POA: Diagnosis not present

## 2013-04-01 DIAGNOSIS — I69998 Other sequelae following unspecified cerebrovascular disease: Secondary | ICD-10-CM | POA: Diagnosis not present

## 2013-04-08 DIAGNOSIS — R42 Dizziness and giddiness: Secondary | ICD-10-CM | POA: Diagnosis not present

## 2013-04-08 DIAGNOSIS — I69998 Other sequelae following unspecified cerebrovascular disease: Secondary | ICD-10-CM | POA: Diagnosis not present

## 2013-04-09 ENCOUNTER — Other Ambulatory Visit: Payer: Self-pay | Admitting: Internal Medicine

## 2013-04-13 ENCOUNTER — Encounter: Payer: Self-pay | Admitting: Internal Medicine

## 2013-04-13 ENCOUNTER — Ambulatory Visit (INDEPENDENT_AMBULATORY_CARE_PROVIDER_SITE_OTHER): Payer: Medicare Other | Admitting: Internal Medicine

## 2013-04-13 VITALS — BP 112/62 | HR 76 | Temp 98.1°F

## 2013-04-13 DIAGNOSIS — R079 Chest pain, unspecified: Secondary | ICD-10-CM | POA: Diagnosis not present

## 2013-04-13 DIAGNOSIS — F411 Generalized anxiety disorder: Secondary | ICD-10-CM

## 2013-04-13 DIAGNOSIS — I83893 Varicose veins of bilateral lower extremities with other complications: Secondary | ICD-10-CM

## 2013-04-13 DIAGNOSIS — H8110 Benign paroxysmal vertigo, unspecified ear: Secondary | ICD-10-CM

## 2013-04-13 MED ORDER — ALPRAZOLAM 0.25 MG PO TABS: 0.2500 mg | ORAL_TABLET | Freq: Three times a day (TID) | ORAL | Status: DC | PRN

## 2013-04-13 NOTE — Assessment & Plan Note (Addendum)
Patient reports lower substernal chest pain previous ago while working on her back porch. Refer to cardiology for further evaluation. No change in EKG.  Patient certainly has numerous risk factors.  Continue aaily aspirin and Cozaar. Unfortunately patient is statin intolerant.

## 2013-04-13 NOTE — Patient Instructions (Signed)
Follow up with your vascular specialist re: left leg swelling Our office will contact you re: cardiology referral

## 2013-04-13 NOTE — Progress Notes (Signed)
Subjective:    Patient ID: Christina Mckinney, female    DOB: November 23, 1931, 79 y.o.   MRN: [id number]  HPI  79 year old white female previously seen for benign positional vertigo for followup. Patient seen by ENT-Dr. Excell Seltzer. She has had additional treatments for BPV with some improvement.  She is history of anxiety issues/stress disorder. Patient reports her grandson recently moved in with her due to his own social issues. This is exacerbating her stress.  Patient describes episode of chest pain 3 weeks ago. Patient tends to work around the house when she is stressed. She describes working on her back deck when she leaned forward and experienced substernal chest pain. Symptoms lasted several hours and resolved. She reports taking a nitroglycerin tablet and an aspirin.  Review of Systems No current chest pains,  No shortness of breath    Past Medical History  Diagnosis Date  . Depressive disorder, not elsewhere classified   . Diverticulitis   . GERD (gastroesophageal reflux disease)   . Other and unspecified hyperlipidemia   . Unspecified essential hypertension   . Other abnormal glucose   . Unspecified hypothyroidism   . Personal history of colonic polyps   . Anxiety state, unspecified   . Personal history of peptic ulcer disease   . Osteoarthritis   . Gout, unspecified   . Essential and other specified forms of tremor   . Internal hemorrhoids without mention of complication   . Anemia, unspecified   . Unspecified disorder of bladder   . Pulmonary embolism   . DVT (deep venous thrombosis)   . Varicose veins     History   Social History  . Marital Status: Widowed    Spouse Name: N/A    Number of Children: 3  . Years of Education: N/A   Occupational History  . Retired     Social History Main Topics  . Smoking status: Former Smoker    Types: Cigarettes    Quit date: 11/12/1988  . Smokeless tobacco: Never Used  . Alcohol Use: 2.0 oz/week    4 drink(s) per week     Comment:  occasional wine   . Drug Use: No  . Sexually Active: Not on file   Other Topics Concern  . Not on file   Social History Narrative  . No narrative on file    Past Surgical History  Procedure Laterality Date  . Cholecystectomy    . Abdominal hysterectomy    . Nasal septum surgery    . Shoulder surgery    . Knee arthroscopy      right   . Eye surgery    . Endovenous ablation saphenous vein w/ laser  09-11-2012    left greater saphenous vein   by Gretta Began MD    Family History  Problem Relation Age of Onset  . Colon cancer Neg Hx   . Other Mother     varicose veins  . Heart disease Father   . Hyperlipidemia Father   . Heart attack Father   . Diabetes Daughter   . Heart disease Daughter   . Hyperlipidemia Daughter   . Hypertension Daughter     Allergies  Allergen Reactions  . Crestor (Rosuvastatin)     Left hip pain  . Lipitor (Atorvastatin)     Pain in hips  . Red Dye     Current Outpatient Prescriptions on File Prior to Visit  Medication Sig Dispense Refill  . aspirin 81 MG tablet Take 1 tablet (81  mg total) by mouth daily.  30 tablet    . azelastine (ASTELIN) 137 MCG/SPRAY nasal spray Place 1 spray into the nose daily as needed. Use in each nostril as directed      . Cholecalciferol (VITAMIN D) 2000 UNITS tablet Take 2,000 Units by mouth daily.        Marland Kitchen co-enzyme Q-10 30 MG capsule Take 30 mg by mouth 3 (three) times daily.        Marland Kitchen darifenacin (ENABLEX) 15 MG 24 hr tablet Take 15 mg by mouth daily.      . diazepam (VALIUM) 2 MG tablet Take 1 tablet (2 mg total) by mouth every 8 (eight) hours as needed (vertigo).  30 tablet  0  . diclofenac sodium (VOLTAREN) 1 % GEL Apply 1 application topically 4 (four) times daily. Apply 2 grams qid.  2 Tube  2  . docusate sodium (COLACE) 100 MG capsule Take 1 capsule (100 mg total) by mouth every 12 (twelve) hours.  60 capsule  0  . fluticasone (FLONASE) 50 MCG/ACT nasal spray Place 2 sprays into the nose daily. Allergies       . furosemide (LASIX) 40 MG tablet TAKE 1 TABLET DAILY AS DIRECTED  90 tablet  0  . gabapentin (NEURONTIN) 300 MG capsule TAKE 1 CAPSULE DAILY  90 capsule  0  . Glucosamine-Chondroitin 500-250 MG CAPS Take 1 capsule by mouth at bedtime.       Marland Kitchen HYDROcodone-acetaminophen (NORCO/VICODIN) 5-325 MG per tablet Take 2 tablets by mouth every 6 (six) hours as needed for pain.  90 tablet  0  . ibuprofen (ADVIL,MOTRIN) 200 MG tablet Take 2 tablets (400 mg total) by mouth every 8 (eight) hours as needed for pain.  30 tablet  0  . lansoprazole (PREVACID) 30 MG capsule Take 1 capsule (30 mg total) by mouth 2 (two) times daily.  180 capsule  1  . levothyroxine (SYNTHROID, LEVOTHROID) 50 MCG tablet TAKE 1 TABLET DAILY  90 tablet  0  . losartan (COZAAR) 100 MG tablet TAKE 1 TABLET DAILY  90 tablet  0  . lubiprostone (AMITIZA) 8 MCG capsule Take 1 capsule (8 mcg total) by mouth 2 (two) times daily with a meal.  180 capsule  1  . methocarbamol (ROBAXIN) 500 MG tablet Take 1 tablet (500 mg total) by mouth 2 (two) times daily as needed (pain).  30 tablet  1  . metoprolol succinate (TOPROL-XL) 50 MG 24 hr tablet Take 1 tablet (50 mg total) by mouth daily. Take with or immediately following a meal.  90 tablet  1  . QVAR 80 MCG/ACT inhaler INHALE 2 PUFFS INTO THE LUNGS 2 TIMES A DAY  3 g  1  . rOPINIRole (REQUIP) 1 MG tablet TAKE 1 TABLET AT BEDTIME  90 tablet  0  . [DISCONTINUED] propranolol (INDERAL) 60 MG tablet Take 1 tablet (60 mg total) by mouth 2 (two) times daily.  60 tablet  3   No current facility-administered medications on file prior to visit.    BP 112/62  Pulse 76  Temp(Src) 98.1 F (36.7 C) (Oral)  EKG shows normal sinus rhythm at 72 beats per minute. Occasional PAC.  Objective:   Physical Exam  Constitutional: She is oriented to person, place, and time. She appears well-developed and well-nourished.  HENT:  Head: Normocephalic and atraumatic.  Cardiovascular: Normal rate, regular rhythm and  normal heart sounds.  Exam reveals no gallop.   Pulmonary/Chest: Effort normal and breath sounds normal. She  has no wheezes.  Abdominal: Soft. Bowel sounds are normal. There is no tenderness.  Musculoskeletal:  +1 left lower extremity edema. Left medial thigh tenderness (site of previous vein treatment for varicose veins).  Negative calf tenderness  Neurological: She is alert and oriented to person, place, and time. No cranial nerve deficit.  Skin: Skin is warm and dry.  Psychiatric: She has a normal mood and affect. Her behavior is normal.          Assessment & Plan:

## 2013-04-13 NOTE — Assessment & Plan Note (Signed)
Patient has experienced increased life stressors. She could not tolerate citalopram while in the past. Increase alprazolam to 0.25 mg 3 times a day. Patient understands risk of developing tolerance to benzodiazepines.

## 2013-04-13 NOTE — Assessment & Plan Note (Signed)
Patient has a left lower extremity swelling and tenderness at site of previous treatment for varicose veins. Patient advised to followup with her vascular specialist. Defer venous Doppler to vascular specialist.

## 2013-04-13 NOTE — Assessment & Plan Note (Signed)
Patient seen by ENT-Dr. Excell Seltzer. Continue vestibular rehabilitation.

## 2013-04-14 ENCOUNTER — Ambulatory Visit (INDEPENDENT_AMBULATORY_CARE_PROVIDER_SITE_OTHER): Payer: Medicare Other | Admitting: *Deleted

## 2013-04-14 ENCOUNTER — Other Ambulatory Visit: Payer: Self-pay | Admitting: *Deleted

## 2013-04-14 ENCOUNTER — Encounter (INDEPENDENT_AMBULATORY_CARE_PROVIDER_SITE_OTHER): Payer: Medicare Other | Admitting: *Deleted

## 2013-04-14 ENCOUNTER — Encounter: Payer: Self-pay | Admitting: Vascular Surgery

## 2013-04-14 DIAGNOSIS — I83893 Varicose veins of bilateral lower extremities with other complications: Secondary | ICD-10-CM

## 2013-04-14 DIAGNOSIS — I658 Occlusion and stenosis of other precerebral arteries: Secondary | ICD-10-CM | POA: Diagnosis not present

## 2013-04-14 DIAGNOSIS — I6529 Occlusion and stenosis of unspecified carotid artery: Secondary | ICD-10-CM | POA: Diagnosis not present

## 2013-04-14 DIAGNOSIS — M79609 Pain in unspecified limb: Secondary | ICD-10-CM

## 2013-04-14 DIAGNOSIS — M79605 Pain in left leg: Secondary | ICD-10-CM

## 2013-04-14 NOTE — Progress Notes (Signed)
[name] is s/p endovenous laser ablation left greater saphenous vein on 09-11-2013.  [name] is a work in today.  She is c/o bilateral leg pain with mild swelling top of left foot.  Reviewed venous reflux exam from today with Dr. Arbie Cookey.   Reviewed bilateral reflux exam with [name] which revealed no DVT bilaterally and the left greater saphenous vein is closed with no significant venous reflux bilaterally.  Reassured [name] that no DVT is present and encouraged her to elevate her feet when sitting, wear her thigh high 20-30 mm HG compression stockings, use an ice compress prn, and take Ibuprofen 600 mg as needed for pain. [name] appears reassured and relieved. Requested that [name] call [facility] if symptoms worsened or if she had further questions or concerns.

## 2013-04-15 DIAGNOSIS — I69998 Other sequelae following unspecified cerebrovascular disease: Secondary | ICD-10-CM | POA: Diagnosis not present

## 2013-04-15 DIAGNOSIS — R42 Dizziness and giddiness: Secondary | ICD-10-CM | POA: Diagnosis not present

## 2013-04-16 ENCOUNTER — Encounter: Payer: Self-pay | Admitting: Internal Medicine

## 2013-04-18 ENCOUNTER — Other Ambulatory Visit: Payer: Self-pay | Admitting: Internal Medicine

## 2013-04-20 DIAGNOSIS — I69998 Other sequelae following unspecified cerebrovascular disease: Secondary | ICD-10-CM | POA: Diagnosis not present

## 2013-04-20 DIAGNOSIS — R42 Dizziness and giddiness: Secondary | ICD-10-CM | POA: Diagnosis not present

## 2013-04-27 DIAGNOSIS — I69998 Other sequelae following unspecified cerebrovascular disease: Secondary | ICD-10-CM | POA: Diagnosis not present

## 2013-04-28 ENCOUNTER — Encounter (INDEPENDENT_AMBULATORY_CARE_PROVIDER_SITE_OTHER): Payer: Medicare Other

## 2013-04-28 DIAGNOSIS — I6529 Occlusion and stenosis of unspecified carotid artery: Secondary | ICD-10-CM

## 2013-04-29 ENCOUNTER — Ambulatory Visit: Payer: Medicare Other | Admitting: Internal Medicine

## 2013-04-30 DIAGNOSIS — R42 Dizziness and giddiness: Secondary | ICD-10-CM | POA: Diagnosis not present

## 2013-04-30 DIAGNOSIS — I69998 Other sequelae following unspecified cerebrovascular disease: Secondary | ICD-10-CM | POA: Diagnosis not present

## 2013-05-02 ENCOUNTER — Other Ambulatory Visit: Payer: Self-pay | Admitting: Internal Medicine

## 2013-05-04 ENCOUNTER — Other Ambulatory Visit: Payer: Self-pay | Admitting: Internal Medicine

## 2013-05-07 DIAGNOSIS — M19019 Primary osteoarthritis, unspecified shoulder: Secondary | ICD-10-CM | POA: Diagnosis not present

## 2013-05-07 DIAGNOSIS — M1710 Unilateral primary osteoarthritis, unspecified knee: Secondary | ICD-10-CM | POA: Diagnosis not present

## 2013-05-07 DIAGNOSIS — IMO0002 Reserved for concepts with insufficient information to code with codable children: Secondary | ICD-10-CM | POA: Diagnosis not present

## 2013-05-07 DIAGNOSIS — Z96619 Presence of unspecified artificial shoulder joint: Secondary | ICD-10-CM | POA: Diagnosis not present

## 2013-05-08 ENCOUNTER — Telehealth: Payer: Self-pay | Admitting: Internal Medicine

## 2013-05-08 NOTE — Telephone Encounter (Signed)
Yes, we can perform these injections at [facility]

## 2013-05-08 NOTE — Telephone Encounter (Signed)
Pt has to have knee replacement this winter.  Until then, pt needs synvisg inj in knee cap. To keep from having to travel to [facility], (DR Daphine Deutscher), pt would like to know if you could do these injections. (in both knees)  Pls advise.

## 2013-05-08 NOTE — Telephone Encounter (Signed)
lmom/kh 

## 2013-05-08 NOTE — Telephone Encounter (Signed)
Can you call pt and schedule appt with Dr Artist Pais

## 2013-05-11 ENCOUNTER — Telehealth: Payer: Self-pay | Admitting: Internal Medicine

## 2013-05-11 DIAGNOSIS — I69998 Other sequelae following unspecified cerebrovascular disease: Secondary | ICD-10-CM | POA: Diagnosis not present

## 2013-05-11 DIAGNOSIS — R42 Dizziness and giddiness: Secondary | ICD-10-CM | POA: Diagnosis not present

## 2013-05-11 NOTE — Telephone Encounter (Signed)
Per Dr Lovell Sheehan she needs to go see her orthopedist since Dr Artist Pais is out for 4 wks.  Pt verbalized understanding and had no questions

## 2013-05-11 NOTE — Telephone Encounter (Signed)
PT called and stated that she would like to receive her Simvisq injections here at the office, rather than driving back to her orthopedic in [place]. Please assist.

## 2013-05-13 DIAGNOSIS — R42 Dizziness and giddiness: Secondary | ICD-10-CM | POA: Diagnosis not present

## 2013-05-13 DIAGNOSIS — I69998 Other sequelae following unspecified cerebrovascular disease: Secondary | ICD-10-CM | POA: Diagnosis not present

## 2013-05-18 DIAGNOSIS — R42 Dizziness and giddiness: Secondary | ICD-10-CM | POA: Diagnosis not present

## 2013-05-18 DIAGNOSIS — I69998 Other sequelae following unspecified cerebrovascular disease: Secondary | ICD-10-CM | POA: Diagnosis not present

## 2013-05-20 DIAGNOSIS — I69998 Other sequelae following unspecified cerebrovascular disease: Secondary | ICD-10-CM | POA: Diagnosis not present

## 2013-05-20 DIAGNOSIS — R42 Dizziness and giddiness: Secondary | ICD-10-CM | POA: Diagnosis not present

## 2013-05-26 ENCOUNTER — Other Ambulatory Visit: Payer: Self-pay | Admitting: Internal Medicine

## 2013-05-26 DIAGNOSIS — I69998 Other sequelae following unspecified cerebrovascular disease: Secondary | ICD-10-CM | POA: Diagnosis not present

## 2013-05-26 DIAGNOSIS — R42 Dizziness and giddiness: Secondary | ICD-10-CM | POA: Diagnosis not present

## 2013-05-28 DIAGNOSIS — R42 Dizziness and giddiness: Secondary | ICD-10-CM | POA: Diagnosis not present

## 2013-05-28 DIAGNOSIS — I69998 Other sequelae following unspecified cerebrovascular disease: Secondary | ICD-10-CM | POA: Diagnosis not present

## 2013-05-29 ENCOUNTER — Other Ambulatory Visit: Payer: Self-pay | Admitting: Internal Medicine

## 2013-06-01 ENCOUNTER — Telehealth: Payer: Self-pay | Admitting: *Deleted

## 2013-06-01 NOTE — Telephone Encounter (Signed)
Ok to refill x 3 

## 2013-06-01 NOTE — Telephone Encounter (Signed)
Pt needs a refill on alprazolam 0.25 mg 1 tab bid #60.  Last refill on was 05/04/13 [facility]

## 2013-06-02 ENCOUNTER — Other Ambulatory Visit: Payer: Self-pay | Admitting: Internal Medicine

## 2013-06-02 MED ORDER — ALPRAZOLAM 0.25 MG PO TABS: 0.2500 mg | ORAL_TABLET | Freq: Three times a day (TID) | ORAL | Status: DC | PRN

## 2013-06-02 NOTE — Telephone Encounter (Signed)
rx called into pharmacy

## 2013-06-03 DIAGNOSIS — IMO0002 Reserved for concepts with insufficient information to code with codable children: Secondary | ICD-10-CM | POA: Diagnosis not present

## 2013-06-03 DIAGNOSIS — M1710 Unilateral primary osteoarthritis, unspecified knee: Secondary | ICD-10-CM | POA: Diagnosis not present

## 2013-06-05 ENCOUNTER — Other Ambulatory Visit: Payer: Self-pay | Admitting: Internal Medicine

## 2013-06-08 DIAGNOSIS — Z961 Presence of intraocular lens: Secondary | ICD-10-CM | POA: Diagnosis not present

## 2013-06-08 DIAGNOSIS — H1010 Acute atopic conjunctivitis, unspecified eye: Secondary | ICD-10-CM | POA: Diagnosis not present

## 2013-06-09 DIAGNOSIS — R42 Dizziness and giddiness: Secondary | ICD-10-CM | POA: Diagnosis not present

## 2013-06-10 DIAGNOSIS — IMO0002 Reserved for concepts with insufficient information to code with codable children: Secondary | ICD-10-CM | POA: Diagnosis not present

## 2013-06-10 DIAGNOSIS — M1710 Unilateral primary osteoarthritis, unspecified knee: Secondary | ICD-10-CM | POA: Diagnosis not present

## 2013-06-11 ENCOUNTER — Encounter: Payer: Self-pay | Admitting: Cardiology

## 2013-06-11 ENCOUNTER — Ambulatory Visit (INDEPENDENT_AMBULATORY_CARE_PROVIDER_SITE_OTHER): Payer: Medicare Other | Admitting: Cardiology

## 2013-06-11 VITALS — BP 126/64 | HR 76 | Ht 61.0 in | Wt 166.8 lb

## 2013-06-11 DIAGNOSIS — I6529 Occlusion and stenosis of unspecified carotid artery: Secondary | ICD-10-CM

## 2013-06-11 DIAGNOSIS — R079 Chest pain, unspecified: Secondary | ICD-10-CM | POA: Diagnosis not present

## 2013-06-11 DIAGNOSIS — E785 Hyperlipidemia, unspecified: Secondary | ICD-10-CM | POA: Diagnosis not present

## 2013-06-11 DIAGNOSIS — I10 Essential (primary) hypertension: Secondary | ICD-10-CM

## 2013-06-11 DIAGNOSIS — I658 Occlusion and stenosis of other precerebral arteries: Secondary | ICD-10-CM

## 2013-06-11 DIAGNOSIS — I6523 Occlusion and stenosis of bilateral carotid arteries: Secondary | ICD-10-CM

## 2013-06-11 NOTE — Progress Notes (Signed)
[name] Date of Birth: 11-09-1932 Medical Record #[id number]  History of Present Illness: Christina Mckinney is seen at the request of Dr. Artist Pais for evaluation of chest pain. She is a very pleasant 79 year old white female with history of hypertension, hyperlipidemia, and carotid arterial disease. Recently she was cleaning some outside furniture. She developed a sudden onset of severe mid substernal chest pain. This did not radiate. It was not associated with shortness of breath. She went inside and took an aspirin and nitroglycerin and her pain resolved. She thinks altogether it lasted only 2-3 minutes. She's had no recurrent chest pain. She did have an episode last night where she felt like she might black out. From a cardiac standpoint she has done well. In 2001 she had an exercise stress test and was told that this was abnormal. She subsequently underwent a cardiac catheterization at [facility] and this was apparently normal. She had a stress Myoview study in July of 2010 here which showed ST changes on ECG but a normal Myoview study. She reports that she is going to need a total knee replacement this winter.  Current Outpatient Prescriptions on File Prior to Visit  Medication Sig Dispense Refill  . ALPRAZolam (XANAX) 0.25 MG tablet TAKE ONE TABLET BY MOUTH TWICE DAILY AS NEEDED  60 tablet  3  . aspirin 81 MG tablet Take 1 tablet (81 mg total) by mouth daily.  30 tablet    . azelastine (ASTELIN) 137 MCG/SPRAY nasal spray Place 1 spray into the nose daily as needed. Use in each nostril as directed      . Cholecalciferol (VITAMIN D) 2000 UNITS tablet Take 2,000 Units by mouth daily.        Marland Kitchen co-enzyme Q-10 30 MG capsule Take 30 mg by mouth 3 (three) times daily.        Marland Kitchen darifenacin (ENABLEX) 15 MG 24 hr tablet Take 15 mg by mouth daily.      . diazepam (VALIUM) 2 MG tablet Take 1 tablet (2 mg total) by mouth every 8 (eight) hours as needed (vertigo).  30 tablet  0  . diclofenac  sodium (VOLTAREN) 1 % GEL Apply 1 application topically 4 (four) times daily. Apply 2 grams qid.  2 Tube  2  . docusate sodium (COLACE) 100 MG capsule Take 1 capsule (100 mg total) by mouth every 12 (twelve) hours.  60 capsule  0  . fluticasone (FLONASE) 50 MCG/ACT nasal spray Place 2 sprays into the nose daily. Allergies      . furosemide (LASIX) 40 MG tablet TAKE 1 TABLET DAILY AS DIRECTED  90 tablet  0  . gabapentin (NEURONTIN) 300 MG capsule TAKE 1 CAPSULE DAILY  90 capsule  3  . Glucosamine-Chondroitin 500-250 MG CAPS Take 1 capsule by mouth at bedtime.       Marland Kitchen HYDROcodone-acetaminophen (NORCO/VICODIN) 5-325 MG per tablet Take 2 tablets by mouth every 6 (six) hours as needed for pain.  90 tablet  0  . ibuprofen (ADVIL,MOTRIN) 200 MG tablet Take 2 tablets (400 mg total) by mouth every 8 (eight) hours as needed for pain.  30 tablet  0  . lansoprazole (PREVACID) 30 MG capsule TAKE 1 CAPSULE TWICE A DAY  180 capsule  0  . levothyroxine (SYNTHROID, LEVOTHROID) 50 MCG tablet TAKE 1 TABLET DAILY  90 tablet  1  . losartan (COZAAR) 100 MG tablet TAKE 1 TABLET DAILY  90 tablet  1  . lubiprostone (AMITIZA) 8 MCG capsule  Take 1 capsule (8 mcg total) by mouth 2 (two) times daily with a meal.  180 capsule  1  . methocarbamol (ROBAXIN) 500 MG tablet Take 1 tablet (500 mg total) by mouth 2 (two) times daily as needed (pain).  30 tablet  1  . metoprolol succinate (TOPROL-XL) 50 MG 24 hr tablet Take 1 tablet (50 mg total) by mouth daily. Take with or immediately following a meal.  90 tablet  1  . QVAR 80 MCG/ACT inhaler INHALE 2 PUFFS INTO THE LUNGS 2 TIMES A DAY  3 g  1  . rOPINIRole (REQUIP) 1 MG tablet TAKE 1 TABLET AT BEDTIME  90 tablet  0  . ULORIC 40 MG tablet TAKE 1 TABLET DAILY  90 tablet  1  . [DISCONTINUED] propranolol (INDERAL) 60 MG tablet Take 1 tablet (60 mg total) by mouth 2 (two) times daily.  60 tablet  3   No current facility-administered medications on file prior to visit.    Allergies    Allergen Reactions  . Crestor (Rosuvastatin)     Left hip pain  . Lipitor (Atorvastatin)     Pain in hips  . Red Dye     Past Medical History  Diagnosis Date  . Depressive disorder, not elsewhere classified   . Diverticulitis   . GERD (gastroesophageal reflux disease)   . Other and unspecified hyperlipidemia   . Unspecified essential hypertension   . Other abnormal glucose   . Unspecified hypothyroidism   . Personal history of colonic polyps   . Anxiety state, unspecified   . Personal history of peptic ulcer disease   . Osteoarthritis   . Gout, unspecified   . Essential and other specified forms of tremor   . Internal hemorrhoids without mention of complication   . Anemia, unspecified   . Unspecified disorder of bladder   . Pulmonary embolism   . DVT (deep venous thrombosis)   . Varicose veins     Past Surgical History  Procedure Laterality Date  . Cholecystectomy    . Abdominal hysterectomy    . Nasal septum surgery    . Shoulder surgery    . Knee arthroscopy      right   . Eye surgery    . Endovenous ablation saphenous vein w/ laser  09-11-2012    left greater saphenous vein   by Gretta Began MD    History  Smoking status  . Former Smoker  . Types: Cigarettes  . Quit date: 11/12/1988  Smokeless tobacco  . Never Used    History  Alcohol Use  . 2.0 oz/week  . 4 drink(s) per week    Comment: occasional wine     Family History  Problem Relation Age of Onset  . Colon cancer Neg Hx   . Other Mother     varicose veins  . Heart disease Father   . Hyperlipidemia Father   . Heart attack Father   . Diabetes Daughter   . Heart disease Daughter   . Hyperlipidemia Daughter   . Hypertension Daughter     Review of Systems: The review of systems is positive for chronic arthritis. She is currently receiving injections in her knee. She has been under a great deal of family and emotional stress.  All other systems were reviewed and are negative.  Physical  Exam: BP 126/64  Pulse 76  Ht 5\' 1"  (1.549 m)  Wt 166 lb 12.8 oz (75.66 kg)  BMI 31.53 kg/m2  SpO2 97%  She is a pleasant white female in no acute distress. HEENT: Normocephalic, atraumatic. Pupils equal round and reactive to light and accommodation. Extraocular movements are full. Sclera are clear. Oropharynx is clear. Neck is supple without JVD, adenopathy, thyromegaly, or bruits. Lungs: Clear Cardiovascular: Regular rate and rhythm, normal S1 and S2, no gallop, murmur, or click. Abdomen: Soft and nontender. No masses or hepatosplenomegaly. Bowel sounds are positive. Extremities: Arthritic changes in her hands, knees, and feet. No edema. Pulses are 2+. Skin: Warm and dry Neuro: Alert and oriented x3. Cranial nerves II through XII are intact. LABORATORY DATA: Recent ECG was normal. Lab Results  Component Value Date   WBC 7.0 10/01/2012   HGB 12.2 10/01/2012   HCT 36.4 10/01/2012   PLT 214 10/01/2012   GLUCOSE 97 11/03/2012   CHOL 190 11/03/2012   TRIG 133.0 11/03/2012   HDL 49.80 11/03/2012   LDLCALC 114* 11/03/2012   ALT 12 11/03/2012   AST 25 11/03/2012   NA 140 11/03/2012   K 4.2 11/03/2012   CL 105 11/03/2012   CREATININE 1.2 11/03/2012   BUN 21 11/03/2012   CO2 28 11/03/2012   TSH 1.59 01/14/2012   INR 1.0 07/21/2010   HGBA1C 5.8 01/14/2012   Recent carotid Dopplers demonstrated moderate bilateral carotid disease.  Assessment / Plan: 1. Chest pain. Patient does have multiple cardiac risk factors including hypertension, hyperlipidemia, and carotid arterial disease. Her last evaluation was 4 years ago. We will obtain a Lexiscan Myoview study.  2. Hypertension-controlled  3. Hyperlipidemia. Patient is intolerant to statin therapy.  4. Carotid arterial disease.

## 2013-06-11 NOTE — Patient Instructions (Signed)
We will schedule you for a nuclear stress test  Continue your current therapy   

## 2013-06-15 ENCOUNTER — Encounter: Payer: Self-pay | Admitting: Internal Medicine

## 2013-06-15 ENCOUNTER — Ambulatory Visit (INDEPENDENT_AMBULATORY_CARE_PROVIDER_SITE_OTHER): Payer: Medicare Other | Admitting: Internal Medicine

## 2013-06-15 VITALS — BP 130/80 | HR 98 | Temp 97.7°F | Resp 20 | Wt 166.0 lb

## 2013-06-15 DIAGNOSIS — L259 Unspecified contact dermatitis, unspecified cause: Secondary | ICD-10-CM | POA: Diagnosis not present

## 2013-06-15 DIAGNOSIS — F411 Generalized anxiety disorder: Secondary | ICD-10-CM

## 2013-06-15 DIAGNOSIS — R079 Chest pain, unspecified: Secondary | ICD-10-CM | POA: Diagnosis not present

## 2013-06-15 DIAGNOSIS — L309 Dermatitis, unspecified: Secondary | ICD-10-CM

## 2013-06-15 DIAGNOSIS — L989 Disorder of the skin and subcutaneous tissue, unspecified: Secondary | ICD-10-CM | POA: Diagnosis not present

## 2013-06-15 MED ORDER — TRIAMCINOLONE ACETONIDE 0.1 % EX CREA: TOPICAL_CREAM | Freq: Two times a day (BID) | CUTANEOUS | Status: DC

## 2013-06-15 NOTE — Progress Notes (Signed)
Subjective:    Patient ID: Christina Mckinney, female    DOB: 10-19-1932, 79 y.o.   MRN: [id number]  HPI  79 year old white female with history of anxiety/depression, chest pain for followup. Since previous visit patient was seen by cardiology. Chemical stress test as planned. Patient reports chest pain has improved.  She continues to have anxiety issues. She reports stressors from interpersonal relationships with her family.  Patient complains of several small bumps on left forearm. Patient also requests referral to dermatology for possible "sunspots" of lower extremities.  She is planning for right knee replacement.  Review of Systems Negative for chest pain     Past Medical History  Diagnosis Date  . Depressive disorder, not elsewhere classified   . Diverticulitis   . GERD (gastroesophageal reflux disease)   . Other and unspecified hyperlipidemia   . Unspecified essential hypertension   . Other abnormal glucose   . Unspecified hypothyroidism   . Personal history of colonic polyps   . Anxiety state, unspecified   . Personal history of peptic ulcer disease   . Osteoarthritis   . Gout, unspecified   . Essential and other specified forms of tremor   . Internal hemorrhoids without mention of complication   . Anemia, unspecified   . Unspecified disorder of bladder   . Pulmonary embolism   . DVT (deep venous thrombosis)   . Varicose veins     History   Social History  . Marital Status: Widowed    Spouse Name: N/A    Number of Children: 3  . Years of Education: N/A   Occupational History  . Retired     Social History Main Topics  . Smoking status: Former Smoker    Types: Cigarettes    Quit date: 11/12/1988  . Smokeless tobacco: Never Used  . Alcohol Use: 2.0 oz/week    4 drink(s) per week     Comment: occasional wine   . Drug Use: No  . Sexually Active: Not on file   Other Topics Concern  . Not on file   Social History Narrative  . No narrative on file     Past Surgical History  Procedure Laterality Date  . Cholecystectomy    . Abdominal hysterectomy    . Nasal septum surgery    . Shoulder surgery    . Knee arthroscopy      right   . Eye surgery    . Endovenous ablation saphenous vein w/ laser  09-11-2012    left greater saphenous vein   by Gretta Began MD    Family History  Problem Relation Age of Onset  . Colon cancer Neg Hx   . Other Mother     varicose veins  . Heart disease Father   . Hyperlipidemia Father   . Heart attack Father   . Diabetes Daughter   . Heart disease Daughter   . Hyperlipidemia Daughter   . Hypertension Daughter     Allergies  Allergen Reactions  . Crestor (Rosuvastatin)     Left hip pain  . Lipitor (Atorvastatin)     Pain in hips  . Red Dye     Current Outpatient Prescriptions on File Prior to Visit  Medication Sig Dispense Refill  . ALPRAZolam (XANAX) 0.25 MG tablet TAKE ONE TABLET BY MOUTH TWICE DAILY AS NEEDED  60 tablet  3  . aspirin 81 MG tablet Take 1 tablet (81 mg total) by mouth daily.  30 tablet    . azelastine (  ASTELIN) 137 MCG/SPRAY nasal spray Place 1 spray into the nose daily as needed. Use in each nostril as directed      . Cholecalciferol (VITAMIN D) 2000 UNITS tablet Take 2,000 Units by mouth daily.        Marland Kitchen co-enzyme Q-10 30 MG capsule Take 30 mg by mouth 3 (three) times daily.        Marland Kitchen darifenacin (ENABLEX) 15 MG 24 hr tablet Take 15 mg by mouth daily.      . diazepam (VALIUM) 2 MG tablet Take 1 tablet (2 mg total) by mouth every 8 (eight) hours as needed (vertigo).  30 tablet  0  . diclofenac sodium (VOLTAREN) 1 % GEL Apply 1 application topically 4 (four) times daily. Apply 2 grams qid.  2 Tube  2  . docusate sodium (COLACE) 100 MG capsule Take 1 capsule (100 mg total) by mouth every 12 (twelve) hours.  60 capsule  0  . fluticasone (FLONASE) 50 MCG/ACT nasal spray Place 2 sprays into the nose daily. Allergies      . furosemide (LASIX) 40 MG tablet TAKE 1 TABLET DAILY AS  DIRECTED  90 tablet  0  . gabapentin (NEURONTIN) 300 MG capsule TAKE 1 CAPSULE DAILY  90 capsule  3  . Glucosamine-Chondroitin 500-250 MG CAPS Take 1 capsule by mouth at bedtime.       Marland Kitchen HYDROcodone-acetaminophen (NORCO/VICODIN) 5-325 MG per tablet Take 2 tablets by mouth every 6 (six) hours as needed for pain.  90 tablet  0  . ibuprofen (ADVIL,MOTRIN) 200 MG tablet Take 2 tablets (400 mg total) by mouth every 8 (eight) hours as needed for pain.  30 tablet  0  . lansoprazole (PREVACID) 30 MG capsule TAKE 1 CAPSULE TWICE A DAY  180 capsule  0  . levothyroxine (SYNTHROID, LEVOTHROID) 50 MCG tablet TAKE 1 TABLET DAILY  90 tablet  1  . losartan (COZAAR) 100 MG tablet TAKE 1 TABLET DAILY  90 tablet  1  . lubiprostone (AMITIZA) 8 MCG capsule Take 1 capsule (8 mcg total) by mouth 2 (two) times daily with a meal.  180 capsule  1  . methocarbamol (ROBAXIN) 500 MG tablet Take 1 tablet (500 mg total) by mouth 2 (two) times daily as needed (pain).  30 tablet  1  . metoprolol succinate (TOPROL-XL) 50 MG 24 hr tablet Take 1 tablet (50 mg total) by mouth daily. Take with or immediately following a meal.  90 tablet  1  . QVAR 80 MCG/ACT inhaler INHALE 2 PUFFS INTO THE LUNGS 2 TIMES A DAY  3 g  1  . rOPINIRole (REQUIP) 1 MG tablet TAKE 1 TABLET AT BEDTIME  90 tablet  0  . ULORIC 40 MG tablet TAKE 1 TABLET DAILY  90 tablet  1  . [DISCONTINUED] propranolol (INDERAL) 60 MG tablet Take 1 tablet (60 mg total) by mouth 2 (two) times daily.  60 tablet  3   No current facility-administered medications on file prior to visit.    BP 130/80  Pulse 98  Temp(Src) 97.7 F (36.5 C) (Oral)  Resp 20  Wt 166 lb (75.297 kg)  BMI 31.38 kg/m2  SpO2 95%    Objective:   Physical Exam  Constitutional: She is oriented to person, place, and time. She appears well-developed and well-nourished.  Cardiovascular: Normal rate, regular rhythm and normal heart sounds.   Pulmonary/Chest: Effort normal and breath sounds normal. She  has no wheezes.  Neurological: She is alert and oriented to  person, place, and time. No cranial nerve deficit.  Skin:  Slight roughness of left forearm.  2-3 nickel sized hyperpigmented areas right lower ext  Psychiatric: She has a normal mood and affect. Her behavior is normal.          Assessment & Plan:

## 2013-06-15 NOTE — Assessment & Plan Note (Signed)
Patient seen by cardiology. Awaiting results of chemical stress test. Continue risk factor management. Unfortunately patient unable to tolerate statin therapy.

## 2013-06-15 NOTE — Assessment & Plan Note (Signed)
Patient has scaly area on left forearm. Trial of triamcinolone cream.  She likely has lentigo senilis on right lower ext.  She requests referral to dermatology - (Refer to Dr. Karlyn Agee)

## 2013-06-15 NOTE — Assessment & Plan Note (Signed)
We reviewed stress mgt and deep breathing techniques.  Use alprazolam for acute / panic symptoms.

## 2013-06-16 DIAGNOSIS — I69998 Other sequelae following unspecified cerebrovascular disease: Secondary | ICD-10-CM | POA: Diagnosis not present

## 2013-06-17 DIAGNOSIS — M1710 Unilateral primary osteoarthritis, unspecified knee: Secondary | ICD-10-CM | POA: Diagnosis not present

## 2013-06-17 DIAGNOSIS — IMO0002 Reserved for concepts with insufficient information to code with codable children: Secondary | ICD-10-CM | POA: Diagnosis not present

## 2013-06-18 ENCOUNTER — Ambulatory Visit (HOSPITAL_COMMUNITY): Payer: Medicare Other | Attending: Cardiology | Admitting: Radiology

## 2013-06-18 VITALS — BP 144/68 | HR 62 | Ht 61.0 in | Wt 163.0 lb

## 2013-06-18 DIAGNOSIS — R42 Dizziness and giddiness: Secondary | ICD-10-CM | POA: Diagnosis not present

## 2013-06-18 DIAGNOSIS — R5381 Other malaise: Secondary | ICD-10-CM | POA: Diagnosis not present

## 2013-06-18 DIAGNOSIS — R5383 Other fatigue: Secondary | ICD-10-CM | POA: Insufficient documentation

## 2013-06-18 DIAGNOSIS — R55 Syncope and collapse: Secondary | ICD-10-CM | POA: Insufficient documentation

## 2013-06-18 DIAGNOSIS — I779 Disorder of arteries and arterioles, unspecified: Secondary | ICD-10-CM | POA: Diagnosis not present

## 2013-06-18 DIAGNOSIS — R079 Chest pain, unspecified: Secondary | ICD-10-CM | POA: Diagnosis not present

## 2013-06-18 DIAGNOSIS — R0989 Other specified symptoms and signs involving the circulatory and respiratory systems: Secondary | ICD-10-CM | POA: Insufficient documentation

## 2013-06-18 DIAGNOSIS — E785 Hyperlipidemia, unspecified: Secondary | ICD-10-CM | POA: Insufficient documentation

## 2013-06-18 DIAGNOSIS — Z87891 Personal history of nicotine dependence: Secondary | ICD-10-CM | POA: Diagnosis not present

## 2013-06-18 DIAGNOSIS — I6523 Occlusion and stenosis of bilateral carotid arteries: Secondary | ICD-10-CM

## 2013-06-18 DIAGNOSIS — R0609 Other forms of dyspnea: Secondary | ICD-10-CM | POA: Diagnosis not present

## 2013-06-18 DIAGNOSIS — Z01810 Encounter for preprocedural cardiovascular examination: Secondary | ICD-10-CM | POA: Insufficient documentation

## 2013-06-18 DIAGNOSIS — R0602 Shortness of breath: Secondary | ICD-10-CM

## 2013-06-18 DIAGNOSIS — Z8249 Family history of ischemic heart disease and other diseases of the circulatory system: Secondary | ICD-10-CM | POA: Diagnosis not present

## 2013-06-18 DIAGNOSIS — I10 Essential (primary) hypertension: Secondary | ICD-10-CM | POA: Diagnosis not present

## 2013-06-18 DIAGNOSIS — J45909 Unspecified asthma, uncomplicated: Secondary | ICD-10-CM | POA: Diagnosis not present

## 2013-06-18 MED ORDER — TECHNETIUM TC 99M SESTAMIBI GENERIC - CARDIOLITE
33.0000 | Freq: Once | INTRAVENOUS | Status: AC | PRN
  Administered 2013-06-18: 33 via INTRAVENOUS

## 2013-06-18 MED ORDER — TECHNETIUM TC 99M SESTAMIBI GENERIC - CARDIOLITE
11.0000 | Freq: Once | INTRAVENOUS | Status: AC | PRN
  Administered 2013-06-18: 11 via INTRAVENOUS

## 2013-06-18 MED ORDER — REGADENOSON 0.4 MG/5ML IV SOLN
0.4000 mg | Freq: Once | INTRAVENOUS | Status: AC
  Administered 2013-06-18: 0.4 mg via INTRAVENOUS

## 2013-06-18 NOTE — Progress Notes (Signed)
[facility] SITE 3 NUCLEAR MED [address] [place], [place] [address] [phone]    Cardiology Nuclear Med Study  Christina Mckinney is a 79 y.o. female     MRN : [id number]     DOB: 10/10/1932  Procedure Date: 06/18/2013  Nuclear Med Background Indication for Stress Test:  Evaluation for Ischemia and Surgical Clearance- Pending TKR - Dr. Salvatore Marvel, [facility]  History: Asthma, '01 Heart Catheterization-Normal at [facility];04-2009 Echo: EF= 55-60%; 06-02-09 Myocardial Perfusion Study-normal, EF=86% Cardiac Risk Factors: Carotid Disease, Family History - CAD, History of Smoking, Hypertension and Lipids  Symptoms: Chest pain with/without exertion (last occurrence 04-2013),  Dizziness, DOE, Fatigue, Fatigue with Exertion, Light-Headedness and Near Syncope   Nuclear Pre-Procedure Caffeine/Decaff Intake:  None NPO After: 9:30pm   Lungs:  clear O2 Sat: 97% on room air. IV 0.9% NS with Angio Cath:  22g  IV Site: R Antecubital  IV Started by:  Bonnita Levan, RN  Chest Size (in):  36 Cup Size: D  Height: 5\' 1"  (1.549 m)  Weight:  163 lb (73.936 kg)  BMI:  Body mass index is 30.81 kg/(m^2). Tech Comments:  Toprol held x 12 hrs    Nuclear Med Study 1 or 2 day study: 1 day  Stress Test Type:  Lexiscan  Reading MD: Cassell Clement, MD  Order Authorizing Provider:  Peter Swaziland, MD  Resting Radionuclide: Technetium 74m Sestamibi  Resting Radionuclide Dose: 10.8 mCi   Stress Radionuclide:  Technetium 23m Sestamibi  Stress Radionuclide Dose: 33.0 mCi           Stress Protocol Rest HR: 62 Stress HR: 89  Rest BP: 144/68 Stress BP: 154/61  Exercise Time (min): n/a METS: n/a   Predicted Max HR: 141 bpm % Max HR: 63.12 bpm Rate Pressure Product: 21308   Dose of Adenosine (mg):  n/a Dose of Lexiscan: 0.4 mg  Dose of Atropine (mg): n/a Dose of Dobutamine: n/a mcg/kg/min (at max HR)  Stress Test Technologist: Irean Hong, RN  Nuclear Technologist:  Domenic Polite, CNMT       Rest Procedure:  Myocardial perfusion imaging was performed at rest 45 minutes following the intravenous administration of Technetium 90m Sestamibi. Rest ECG: NSR - Normal EKG  Stress Procedure:  The patient received IV Lexiscan 0.4 mg over 15-seconds.  Technetium 45m Sestamibi injected at 30-seconds. The patient complained of chest tightness, headache,funny sensation all over. Quantitative spect images were obtained after a 45 minute delay. Stress ECG: No significant change from baseline ECG  QPS Raw Data Images:  Normal; no motion artifact; normal heart/lung ratio. Stress Images:  Normal homogeneous uptake in all areas of the myocardium. Rest Images:  Normal homogeneous uptake in all areas of the myocardium. Subtraction (SDS):  No evidence of ischemia. Transient Ischemic Dilatation (Normal <1.22):  n/a Lung/Heart Ratio (Normal <0.45):  0.35  Quantitative Gated Spect Images QGS EDV:  48 ml QGS ESV:  8 ml  Impression Exercise Capacity:  Lexiscan with no exercise. BP Response:  Normal blood pressure response. Clinical Symptoms:  Mild chest pain/dyspnea. ECG Impression:  No significant ST segment change suggestive of ischemia. Comparison with Prior Nuclear Study: No significant change from previous study  Overall Impression:  Normal stress nuclear study.  LV Ejection Fraction: 84%.  LV Wall Motion:  NL LV Function; NL Wall Motion   [employer]

## 2013-06-20 ENCOUNTER — Other Ambulatory Visit: Payer: Self-pay | Admitting: Internal Medicine

## 2013-06-24 DIAGNOSIS — R42 Dizziness and giddiness: Secondary | ICD-10-CM | POA: Diagnosis not present

## 2013-06-24 DIAGNOSIS — I69998 Other sequelae following unspecified cerebrovascular disease: Secondary | ICD-10-CM | POA: Diagnosis not present

## 2013-07-01 DIAGNOSIS — I69998 Other sequelae following unspecified cerebrovascular disease: Secondary | ICD-10-CM | POA: Diagnosis not present

## 2013-07-10 ENCOUNTER — Telehealth: Payer: Self-pay | Admitting: Internal Medicine

## 2013-07-10 MED ORDER — METOPROLOL SUCCINATE ER 50 MG PO TB24: 50.0000 mg | ORAL_TABLET | Freq: Every day | ORAL | Status: DC

## 2013-07-10 NOTE — Telephone Encounter (Signed)
rx sent in electronically 

## 2013-07-10 NOTE — Telephone Encounter (Signed)
PT called to request a 6 month refill of her metoprolol succinate (TOPROL-XL) 50 MG 24 hr tablet be called into [facility]. She would also like a 1 month supply sent to [facility] [address] to last her until her mail order arrives. Please assist.

## 2013-07-19 ENCOUNTER — Other Ambulatory Visit: Payer: Self-pay | Admitting: Internal Medicine

## 2013-07-21 DIAGNOSIS — L821 Other seborrheic keratosis: Secondary | ICD-10-CM | POA: Diagnosis not present

## 2013-07-21 DIAGNOSIS — D485 Neoplasm of uncertain behavior of skin: Secondary | ICD-10-CM | POA: Diagnosis not present

## 2013-07-21 DIAGNOSIS — L819 Disorder of pigmentation, unspecified: Secondary | ICD-10-CM | POA: Diagnosis not present

## 2013-07-21 DIAGNOSIS — D1801 Hemangioma of skin and subcutaneous tissue: Secondary | ICD-10-CM | POA: Diagnosis not present

## 2013-08-25 ENCOUNTER — Encounter: Payer: Self-pay | Admitting: Internal Medicine

## 2013-08-25 ENCOUNTER — Ambulatory Visit (INDEPENDENT_AMBULATORY_CARE_PROVIDER_SITE_OTHER): Payer: Medicare Other | Admitting: Internal Medicine

## 2013-08-25 VITALS — BP 122/64 | Temp 97.9°F | Ht 61.0 in | Wt 166.0 lb

## 2013-08-25 DIAGNOSIS — Z86711 Personal history of pulmonary embolism: Secondary | ICD-10-CM

## 2013-08-25 DIAGNOSIS — R350 Frequency of micturition: Secondary | ICD-10-CM | POA: Diagnosis not present

## 2013-08-25 DIAGNOSIS — R5381 Other malaise: Secondary | ICD-10-CM | POA: Diagnosis not present

## 2013-08-25 DIAGNOSIS — R0789 Other chest pain: Secondary | ICD-10-CM | POA: Diagnosis not present

## 2013-08-25 DIAGNOSIS — E039 Hypothyroidism, unspecified: Secondary | ICD-10-CM | POA: Diagnosis not present

## 2013-08-25 DIAGNOSIS — I10 Essential (primary) hypertension: Secondary | ICD-10-CM

## 2013-08-25 DIAGNOSIS — R079 Chest pain, unspecified: Secondary | ICD-10-CM

## 2013-08-25 LAB — POCT URINALYSIS DIPSTICK
Blood, UA: NEGATIVE
Glucose, UA: NEGATIVE
Nitrite, UA: NEGATIVE
Protein, UA: NEGATIVE
Spec Grav, UA: 1.015
Urobilinogen, UA: 0.2

## 2013-08-25 MED ORDER — CEFUROXIME AXETIL 250 MG PO TABS: 250.0000 mg | ORAL_TABLET | Freq: Two times a day (BID) | ORAL | Status: DC

## 2013-08-25 NOTE — Assessment & Plan Note (Signed)
Patient's blood pressure is low normal. It is 122/64 sitting. It decreases to 110/60 with standing. Patient advised decreased losartan to 50 mg once daily. Check electrolytes and kidney function.

## 2013-08-25 NOTE — Patient Instructions (Signed)
Use alprazolam 0.25 mg at bedtime as needed for sleep for 1-2 weeks

## 2013-08-25 NOTE — Progress Notes (Signed)
Subjective:    Patient ID: Christina Mckinney, female    DOB: January 03, 1932, 79 y.o.   MRN: [id number]  HPI  79 year old white female with history of hypertension, restless leg syndrome and pulmonary embolism complains of significant malaise and fatigue over the last 77 weeks. She denies any exertional chest pain but describes an unusual sensation near left upper chest. "It feels there's water in my chest". She has mild shortness of breath. She has intermittent cough. It is nonproductive.  She is also suffered from insomnia and night sweats. She reports "sense of doom"  Patient complains of left hip discomfort. She has reduced this to taking cholesterol medication in the past.  She was seen by orthopedic specialists at [facility]. Right knee replacement planned for 09/15/2013  Review of Systems Intermittent cough, urinary frequency, no fever, night sweats    Past Medical History  Diagnosis Date  . Depressive disorder, not elsewhere classified   . Diverticulitis   . GERD (gastroesophageal reflux disease)   . Other and unspecified hyperlipidemia   . Unspecified essential hypertension   . Other abnormal glucose   . Unspecified hypothyroidism   . Personal history of colonic polyps   . Anxiety state, unspecified   . Personal history of peptic ulcer disease   . Osteoarthritis   . Gout, unspecified   . Essential and other specified forms of tremor   . Internal hemorrhoids without mention of complication   . Anemia, unspecified   . Unspecified disorder of bladder   . Pulmonary embolism   . DVT (deep venous thrombosis)   . Varicose veins     History   Social History  . Marital Status: Widowed    Spouse Name: N/A    Number of Children: 3  . Years of Education: N/A   Occupational History  . Retired     Social History Main Topics  . Smoking status: Former Smoker    Types: Cigarettes    Quit date: 11/12/1988  . Smokeless tobacco: Never Used  . Alcohol Use: 2.0 oz/week     4 drink(s) per week     Comment: occasional wine   . Drug Use: No  . Sexual Activity: Not on file   Other Topics Concern  . Not on file   Social History Narrative  . No narrative on file    Past Surgical History  Procedure Laterality Date  . Cholecystectomy    . Abdominal hysterectomy    . Nasal septum surgery    . Shoulder surgery    . Knee arthroscopy      right   . Eye surgery    . Endovenous ablation saphenous vein w/ laser  09-11-2012    left greater saphenous vein   by Gretta Began MD    Family History  Problem Relation Age of Onset  . Colon cancer Neg Hx   . Other Mother     varicose veins  . Heart disease Father   . Hyperlipidemia Father   . Heart attack Father   . Diabetes Daughter   . Heart disease Daughter   . Hyperlipidemia Daughter   . Hypertension Daughter     Allergies  Allergen Reactions  . Crestor [Rosuvastatin]     Left hip pain  . Lipitor [Atorvastatin]     Pain in hips  . Red Dye     Current Outpatient Prescriptions on File Prior to Visit  Medication Sig Dispense Refill  . ALPRAZolam (XANAX) 0.25 MG tablet TAKE  ONE TABLET BY MOUTH TWICE DAILY AS NEEDED  60 tablet  3  . aspirin 81 MG tablet Take 1 tablet (81 mg total) by mouth daily.  30 tablet    . azelastine (ASTELIN) 137 MCG/SPRAY nasal spray Place 1 spray into the nose daily as needed. Use in each nostril as directed      . Cholecalciferol (VITAMIN D) 2000 UNITS tablet Take 2,000 Units by mouth daily.        Marland Kitchen co-enzyme Q-10 30 MG capsule Take 30 mg by mouth 3 (three) times daily.        Marland Kitchen darifenacin (ENABLEX) 15 MG 24 hr tablet Take 15 mg by mouth daily.      . diazepam (VALIUM) 2 MG tablet Take 1 tablet (2 mg total) by mouth every 8 (eight) hours as needed (vertigo).  30 tablet  0  . diclofenac sodium (VOLTAREN) 1 % GEL Apply 1 application topically 4 (four) times daily. Apply 2 grams qid.  2 Tube  2  . docusate sodium (COLACE) 100 MG capsule Take 1 capsule (100 mg total) by mouth  every 12 (twelve) hours.  60 capsule  0  . fluticasone (FLONASE) 50 MCG/ACT nasal spray Place 2 sprays into the nose daily. Allergies      . furosemide (LASIX) 40 MG tablet TAKE 1 TABLET DAILY AS DIRECTED  90 tablet  3  . gabapentin (NEURONTIN) 300 MG capsule TAKE 1 CAPSULE DAILY  90 capsule  3  . Glucosamine-Chondroitin 500-250 MG CAPS Take 1 capsule by mouth at bedtime.       Marland Kitchen HYDROcodone-acetaminophen (NORCO/VICODIN) 5-325 MG per tablet Take 2 tablets by mouth every 6 (six) hours as needed for pain.  90 tablet  0  . ibuprofen (ADVIL,MOTRIN) 200 MG tablet Take 2 tablets (400 mg total) by mouth every 8 (eight) hours as needed for pain.  30 tablet  0  . lansoprazole (PREVACID) 30 MG capsule TAKE 1 CAPSULE TWICE A DAY  180 capsule  0  . levothyroxine (SYNTHROID, LEVOTHROID) 50 MCG tablet TAKE 1 TABLET DAILY  90 tablet  1  . losartan (COZAAR) 100 MG tablet TAKE 1 TABLET DAILY  90 tablet  1  . lubiprostone (AMITIZA) 8 MCG capsule Take 1 capsule (8 mcg total) by mouth 2 (two) times daily with a meal.  180 capsule  1  . methocarbamol (ROBAXIN) 500 MG tablet Take 1 tablet (500 mg total) by mouth 2 (two) times daily as needed (pain).  30 tablet  1  . metoprolol succinate (TOPROL-XL) 50 MG 24 hr tablet Take 1 tablet (50 mg total) by mouth daily. Take with or immediately following a meal.  90 tablet  1  . QVAR 80 MCG/ACT inhaler INHALE 2 PUFFS INTO THE LUNGS TWICE A DAY  3 g  5  . rOPINIRole (REQUIP) 1 MG tablet TAKE 1 TABLET AT BEDTIME  90 tablet  0  . triamcinolone cream (KENALOG) 0.1 % Apply topically 2 (two) times daily.  30 g  0  . ULORIC 40 MG tablet TAKE 1 TABLET DAILY  90 tablet  1  . [DISCONTINUED] propranolol (INDERAL) 60 MG tablet Take 1 tablet (60 mg total) by mouth 2 (two) times daily.  60 tablet  3   No current facility-administered medications on file prior to visit.    BP 122/64  Temp(Src) 97.9 F (36.6 C) (Oral)  Ht 5\' 1"  (1.549 m)  Wt 166 lb (75.297 kg)  BMI 31.38  kg/m2    Objective:  Physical Exam  Constitutional: She appears well-developed and well-nourished.  HENT:  Head: Normocephalic and atraumatic.  Right Ear: External ear normal.  Left Ear: External ear normal.  Mouth/Throat: Oropharynx is clear and moist.  Neck: Neck supple.  Cardiovascular: Normal rate, regular rhythm and normal heart sounds.   No murmur heard. Pulmonary/Chest: Effort normal and breath sounds normal. She has no wheezes.  No dullness to percussion  Musculoskeletal:  Trace lower extremity edema bilaterally. No calf tenderness bilaterally  Lymphadenopathy:    She has no cervical adenopathy.  Skin: Skin is warm and dry.  Psychiatric: She has a normal mood and affect. Her behavior is normal.          Assessment & Plan:

## 2013-08-25 NOTE — Assessment & Plan Note (Signed)
Patient has atypical left upper chest discomfort. She has history of pulmonary embolism. Check d-dimer. If positive repeat CT of chest PE protocol.

## 2013-08-25 NOTE — Assessment & Plan Note (Signed)
79 year old with urinary frequency. UA positive for leukocytes. Question UTI. Empiric treatment with cefuroxime 250 mg twice daily for 7 days. Send urine culture.

## 2013-08-26 LAB — HEPATIC FUNCTION PANEL
ALT: 10 U/L (ref 0–35)
AST: 26 U/L (ref 0–37)
Albumin: 4.4 g/dL (ref 3.5–5.2)
Alkaline Phosphatase: 74 U/L (ref 39–117)
Bilirubin, Direct: 0.1 mg/dL (ref 0.0–0.3)
Total Protein: 7.4 g/dL (ref 6.0–8.3)

## 2013-08-26 LAB — CBC WITH DIFFERENTIAL/PLATELET
Basophils Relative: 0.7 % (ref 0.0–3.0)
Eosinophils Relative: 2.4 % (ref 0.0–5.0)
Hemoglobin: 12.8 g/dL (ref 12.0–15.0)
Lymphocytes Relative: 20.1 % (ref 12.0–46.0)
Monocytes Relative: 5 % (ref 3.0–12.0)
Neutro Abs: 6.1 10*3/uL (ref 1.4–7.7)
Neutrophils Relative %: 71.8 % (ref 43.0–77.0)
RBC: 3.99 Mil/uL (ref 3.87–5.11)
WBC: 8.6 10*3/uL (ref 4.5–10.5)

## 2013-08-26 LAB — BASIC METABOLIC PANEL
CO2: 28 mEq/L (ref 19–32)
Calcium: 10.2 mg/dL (ref 8.4–10.5)
Creatinine, Ser: 1.2 mg/dL (ref 0.4–1.2)
Sodium: 140 mEq/L (ref 135–145)

## 2013-08-26 LAB — D-DIMER, QUANTITATIVE: D-Dimer, Quant: 0.72 ug/mL-FEU — ABNORMAL HIGH (ref 0.00–0.48)

## 2013-09-01 ENCOUNTER — Ambulatory Visit: Payer: Medicare Other | Admitting: Internal Medicine

## 2013-09-06 ENCOUNTER — Other Ambulatory Visit: Payer: Self-pay | Admitting: Internal Medicine

## 2013-09-07 DIAGNOSIS — I2699 Other pulmonary embolism without acute cor pulmonale: Secondary | ICD-10-CM | POA: Diagnosis not present

## 2013-09-07 DIAGNOSIS — R7989 Other specified abnormal findings of blood chemistry: Secondary | ICD-10-CM | POA: Diagnosis not present

## 2013-09-07 DIAGNOSIS — E039 Hypothyroidism, unspecified: Secondary | ICD-10-CM | POA: Diagnosis not present

## 2013-09-07 DIAGNOSIS — I10 Essential (primary) hypertension: Secondary | ICD-10-CM | POA: Diagnosis not present

## 2013-09-08 ENCOUNTER — Ambulatory Visit: Payer: Medicare Other | Admitting: Internal Medicine

## 2013-09-09 ENCOUNTER — Encounter: Payer: Self-pay | Admitting: Internal Medicine

## 2013-09-09 ENCOUNTER — Other Ambulatory Visit: Payer: Self-pay | Admitting: Internal Medicine

## 2013-09-09 ENCOUNTER — Ambulatory Visit (INDEPENDENT_AMBULATORY_CARE_PROVIDER_SITE_OTHER)
Admission: RE | Admit: 2013-09-09 | Discharge: 2013-09-09 | Disposition: A | Discharge status: Home / Self Care | Payer: Medicare Other | Source: Ambulatory Visit | Attending: Internal Medicine | Admitting: Internal Medicine

## 2013-09-09 ENCOUNTER — Ambulatory Visit (INDEPENDENT_AMBULATORY_CARE_PROVIDER_SITE_OTHER): Payer: Medicare Other | Admitting: Internal Medicine

## 2013-09-09 VITALS — BP 124/72 | HR 72 | Temp 98.5°F | Ht 61.0 in | Wt 162.0 lb

## 2013-09-09 DIAGNOSIS — M1990 Unspecified osteoarthritis, unspecified site: Secondary | ICD-10-CM

## 2013-09-09 DIAGNOSIS — R0600 Dyspnea, unspecified: Secondary | ICD-10-CM

## 2013-09-09 DIAGNOSIS — I2699 Other pulmonary embolism without acute cor pulmonale: Secondary | ICD-10-CM

## 2013-09-09 DIAGNOSIS — G2581 Restless legs syndrome: Secondary | ICD-10-CM

## 2013-09-09 DIAGNOSIS — R0609 Other forms of dyspnea: Secondary | ICD-10-CM

## 2013-09-09 DIAGNOSIS — R079 Chest pain, unspecified: Secondary | ICD-10-CM

## 2013-09-09 DIAGNOSIS — I10 Essential (primary) hypertension: Secondary | ICD-10-CM

## 2013-09-09 DIAGNOSIS — IMO0002 Reserved for concepts with insufficient information to code with codable children: Secondary | ICD-10-CM

## 2013-09-09 DIAGNOSIS — M79609 Pain in unspecified limb: Secondary | ICD-10-CM

## 2013-09-09 DIAGNOSIS — J449 Chronic obstructive pulmonary disease, unspecified: Secondary | ICD-10-CM | POA: Diagnosis not present

## 2013-09-09 DIAGNOSIS — Z01818 Encounter for other preprocedural examination: Secondary | ICD-10-CM | POA: Diagnosis not present

## 2013-09-09 DIAGNOSIS — H8110 Benign paroxysmal vertigo, unspecified ear: Secondary | ICD-10-CM

## 2013-09-09 DIAGNOSIS — M79604 Pain in right leg: Secondary | ICD-10-CM

## 2013-09-09 MED ORDER — DIAZEPAM 2 MG PO TABS: 2.0000 mg | ORAL_TABLET | Freq: Three times a day (TID) | ORAL | Status: DC | PRN

## 2013-09-09 MED ORDER — LUBIPROSTONE 8 MCG PO CAPS: 8.0000 ug | ORAL_CAPSULE | Freq: Two times a day (BID) | ORAL | Status: DC

## 2013-09-09 MED ORDER — METHOCARBAMOL 500 MG PO TABS: 500.0000 mg | ORAL_TABLET | Freq: Two times a day (BID) | ORAL | Status: DC | PRN

## 2013-09-09 MED ORDER — LOSARTAN POTASSIUM 50 MG PO TABS: 50.0000 mg | ORAL_TABLET | Freq: Every day | ORAL | Status: DC

## 2013-09-09 MED ORDER — ROPINIROLE HCL 1 MG PO TABS: 1.0000 mg | ORAL_TABLET | Freq: Every day | ORAL | Status: DC

## 2013-09-09 MED ORDER — HYDROCODONE-ACETAMINOPHEN 5-325 MG PO TABS: 2.0000 | ORAL_TABLET | Freq: Four times a day (QID) | ORAL | Status: DC | PRN

## 2013-09-09 NOTE — Assessment & Plan Note (Signed)
Patient has history of pulmonary embolism. Considering her plans for right knee replacement, I will discuss potentially extending her anticoagulation after knee surgery with her orthopedic specialist.

## 2013-09-09 NOTE — Progress Notes (Signed)
Subjective:    Patient ID: Christina Mckinney, female    DOB: March 10, 1932, 79 y.o.   MRN: [id number]  HPI  79 year old white female with history of pulmonary embolism, hypertension and osteoarthritis of her knees for followup. Patient seen on 1014/2014. She reported unusual sensation in her chest. She had mildly elevated d-dimer. Since then patient reports her symptoms have improved/resolved. She denies any chest pain or shortness of breath. Patient found to have incidental UTI was treated with antibiotics. Her night sweats have subsequently resolved.  She is scheduled for right knee replacement at [facility] by Dr. Daphine Deutscher in one week. She reports she had screening EKG which was reported normal by patient.  Patient former smoker. She quit in 1990. She has greater than 30 pack year history. She has chronic dyspnea especially when climbing hills.  Hypertension-blood pressure stable with lowering losartan dose to 50 mg.  RLS - stable Review of Systems Negative for chest pain, negative for dizziness    Past Medical History  Diagnosis Date  . Depressive disorder, not elsewhere classified   . Diverticulitis   . GERD (gastroesophageal reflux disease)   . Other and unspecified hyperlipidemia   . Unspecified essential hypertension   . Other abnormal glucose   . Unspecified hypothyroidism   . Personal history of colonic polyps   . Anxiety state, unspecified   . Personal history of peptic ulcer disease   . Osteoarthritis   . Gout, unspecified   . Essential and other specified forms of tremor   . Internal hemorrhoids without mention of complication   . Anemia, unspecified   . Unspecified disorder of bladder   . Pulmonary embolism   . DVT (deep venous thrombosis)   . Varicose veins     History   Social History  . Marital Status: Widowed    Spouse Name: N/A    Number of Children: 3  . Years of Education: N/A   Occupational History  . Retired     Social History Main Topics   . Smoking status: Former Smoker    Types: Cigarettes    Quit date: 11/12/1988  . Smokeless tobacco: Never Used  . Alcohol Use: 2.0 oz/week    4 drink(s) per week     Comment: occasional wine   . Drug Use: No  . Sexual Activity: Not on file   Other Topics Concern  . Not on file   Social History Narrative  . No narrative on file    Past Surgical History  Procedure Laterality Date  . Cholecystectomy    . Abdominal hysterectomy    . Nasal septum surgery    . Shoulder surgery    . Knee arthroscopy      right   . Eye surgery    . Endovenous ablation saphenous vein w/ laser  09-11-2012    left greater saphenous vein   by Gretta Began MD    Family History  Problem Relation Age of Onset  . Colon cancer Neg Hx   . Other Mother     varicose veins  . Heart disease Father   . Hyperlipidemia Father   . Heart attack Father   . Diabetes Daughter   . Heart disease Daughter   . Hyperlipidemia Daughter   . Hypertension Daughter     Allergies  Allergen Reactions  . Crestor [Rosuvastatin]     Left hip pain  . Lipitor [Atorvastatin]     Pain in hips  . Red Dye  Current Outpatient Prescriptions on File Prior to Visit  Medication Sig Dispense Refill  . ALPRAZolam (XANAX) 0.25 MG tablet TAKE ONE TABLET BY MOUTH TWICE DAILY AS NEEDED  60 tablet  3  . aspirin 81 MG tablet Take 1 tablet (81 mg total) by mouth daily.  30 tablet    . azelastine (ASTELIN) 137 MCG/SPRAY nasal spray Place 1 spray into the nose daily as needed. Use in each nostril as directed      . Cholecalciferol (VITAMIN D) 2000 UNITS tablet Take 2,000 Units by mouth daily.        Marland Kitchen co-enzyme Q-10 30 MG capsule Take 30 mg by mouth 3 (three) times daily.        Marland Kitchen darifenacin (ENABLEX) 15 MG 24 hr tablet Take 15 mg by mouth daily.      . diclofenac sodium (VOLTAREN) 1 % GEL Apply 1 application topically 4 (four) times daily. Apply 2 grams qid.  2 Tube  2  . docusate sodium (COLACE) 100 MG capsule Take 1 capsule (100  mg total) by mouth every 12 (twelve) hours.  60 capsule  0  . fluticasone (FLONASE) 50 MCG/ACT nasal spray Place 2 sprays into the nose daily. Allergies      . furosemide (LASIX) 40 MG tablet TAKE 1 TABLET DAILY AS DIRECTED  90 tablet  3  . gabapentin (NEURONTIN) 300 MG capsule TAKE 1 CAPSULE DAILY  90 capsule  3  . Glucosamine-Chondroitin 500-250 MG CAPS Take 1 capsule by mouth at bedtime.       Marland Kitchen ibuprofen (ADVIL,MOTRIN) 200 MG tablet Take 2 tablets (400 mg total) by mouth every 8 (eight) hours as needed for pain.  30 tablet  0  . lansoprazole (PREVACID) 30 MG capsule TAKE 1 CAPSULE TWICE A DAY  180 capsule  0  . levothyroxine (SYNTHROID, LEVOTHROID) 50 MCG tablet TAKE 1 TABLET DAILY  90 tablet  1  . metoprolol succinate (TOPROL-XL) 50 MG 24 hr tablet Take 1 tablet (50 mg total) by mouth daily. Take with or immediately following a meal.  90 tablet  1  . QVAR 80 MCG/ACT inhaler INHALE 2 PUFFS INTO THE LUNGS TWICE A DAY  3 g  5  . triamcinolone cream (KENALOG) 0.1 % Apply topically 2 (two) times daily.  30 g  0  . ULORIC 40 MG tablet TAKE 1 TABLET DAILY  90 tablet  1  . [DISCONTINUED] propranolol (INDERAL) 60 MG tablet Take 1 tablet (60 mg total) by mouth 2 (two) times daily.  60 tablet  3   No current facility-administered medications on file prior to visit.    BP 124/72  Pulse 72  Temp(Src) 98.5 F (36.9 C) (Oral)  Ht 5\' 1"  (1.549 m)  Wt 162 lb (73.483 kg)  BMI 30.63 kg/m2  SpO2 94%    Objective:   Physical Exam  Constitutional: She appears well-developed and well-nourished. No distress.  HENT:  Head: Normocephalic and atraumatic.  Right Ear: External ear normal.  Left Ear: External ear normal.  Neck: Neck supple.  Cardiovascular: Normal rate, regular rhythm and normal heart sounds.   No murmur heard. Pulmonary/Chest: Effort normal and breath sounds normal. She has no wheezes.  Abdominal: Soft. Bowel sounds are normal. There is no tenderness.  Musculoskeletal:  Trace lower  extremity edema, no calf tenderness  Lymphadenopathy:    She has no cervical adenopathy.  Neurological: She is alert. No cranial nerve deficit. Coordination normal.  Skin: Skin is warm and dry.  Psychiatric:  She has a normal mood and affect. Her behavior is normal.          Assessment & Plan:

## 2013-09-09 NOTE — Assessment & Plan Note (Signed)
Stable. Maintain same dose of Requip

## 2013-09-09 NOTE — Patient Instructions (Signed)
Our office will contact you re: chest xray and lower extremity venous doppler results.

## 2013-09-09 NOTE — Assessment & Plan Note (Signed)
I suspect patient's left upper atypical chest discomfort secondary to undiagnosed COPD. Her d-dimer was slightly elevated. I doubt she has new pulmonary embolism. She has upcoming right knee replacement. Obtain lower extremity venous Dopplers to rule out DVT. Obtain chest x-ray. Considering mild renal insufficiency, if she becomes more symptomatic consider obtaining VQ scan.

## 2013-09-09 NOTE — Assessment & Plan Note (Signed)
Mild dizziness improved with lower dose of losartan. Continue 50 mg.  BP: 124/72 mmHg

## 2013-09-10 ENCOUNTER — Other Ambulatory Visit: Payer: Self-pay | Admitting: *Deleted

## 2013-09-10 MED ORDER — METHOCARBAMOL 500 MG PO TABS: 500.0000 mg | ORAL_TABLET | Freq: Two times a day (BID) | ORAL | Status: DC | PRN

## 2013-09-11 ENCOUNTER — Ambulatory Visit (HOSPITAL_COMMUNITY): Payer: Medicare Other | Attending: Internal Medicine

## 2013-09-11 ENCOUNTER — Other Ambulatory Visit: Payer: Self-pay | Admitting: Internal Medicine

## 2013-09-11 DIAGNOSIS — R791 Abnormal coagulation profile: Secondary | ICD-10-CM | POA: Diagnosis not present

## 2013-09-11 DIAGNOSIS — J4489 Other specified chronic obstructive pulmonary disease: Secondary | ICD-10-CM | POA: Insufficient documentation

## 2013-09-11 DIAGNOSIS — R609 Edema, unspecified: Secondary | ICD-10-CM | POA: Insufficient documentation

## 2013-09-11 DIAGNOSIS — Z86718 Personal history of other venous thrombosis and embolism: Secondary | ICD-10-CM | POA: Diagnosis not present

## 2013-09-11 DIAGNOSIS — E119 Type 2 diabetes mellitus without complications: Secondary | ICD-10-CM | POA: Diagnosis not present

## 2013-09-11 DIAGNOSIS — R0602 Shortness of breath: Secondary | ICD-10-CM

## 2013-09-11 DIAGNOSIS — Z86711 Personal history of pulmonary embolism: Secondary | ICD-10-CM | POA: Diagnosis not present

## 2013-09-11 DIAGNOSIS — E785 Hyperlipidemia, unspecified: Secondary | ICD-10-CM | POA: Diagnosis not present

## 2013-09-11 DIAGNOSIS — I10 Essential (primary) hypertension: Secondary | ICD-10-CM | POA: Insufficient documentation

## 2013-09-11 DIAGNOSIS — M79604 Pain in right leg: Secondary | ICD-10-CM

## 2013-09-11 DIAGNOSIS — J449 Chronic obstructive pulmonary disease, unspecified: Secondary | ICD-10-CM | POA: Diagnosis not present

## 2013-09-11 DIAGNOSIS — Z87891 Personal history of nicotine dependence: Secondary | ICD-10-CM | POA: Diagnosis not present

## 2013-09-12 HISTORY — PX: TOTAL KNEE ARTHROPLASTY: SHX125

## 2013-09-15 ENCOUNTER — Telehealth: Payer: Self-pay

## 2013-09-15 ENCOUNTER — Ambulatory Visit: Payer: Medicare Other | Admitting: Internal Medicine

## 2013-09-15 DIAGNOSIS — I9589 Other hypotension: Secondary | ICD-10-CM | POA: Diagnosis not present

## 2013-09-15 DIAGNOSIS — I129 Hypertensive chronic kidney disease with stage 1 through stage 4 chronic kidney disease, or unspecified chronic kidney disease: Secondary | ICD-10-CM | POA: Diagnosis not present

## 2013-09-15 DIAGNOSIS — Z86718 Personal history of other venous thrombosis and embolism: Secondary | ICD-10-CM | POA: Diagnosis not present

## 2013-09-15 DIAGNOSIS — Z96659 Presence of unspecified artificial knee joint: Secondary | ICD-10-CM | POA: Diagnosis not present

## 2013-09-15 DIAGNOSIS — Z86711 Personal history of pulmonary embolism: Secondary | ICD-10-CM | POA: Diagnosis not present

## 2013-09-15 DIAGNOSIS — IMO0002 Reserved for concepts with insufficient information to code with codable children: Secondary | ICD-10-CM | POA: Diagnosis not present

## 2013-09-15 DIAGNOSIS — I10 Essential (primary) hypertension: Secondary | ICD-10-CM | POA: Diagnosis not present

## 2013-09-15 DIAGNOSIS — N183 Chronic kidney disease, stage 3 unspecified: Secondary | ICD-10-CM | POA: Diagnosis not present

## 2013-09-15 DIAGNOSIS — I2510 Atherosclerotic heart disease of native coronary artery without angina pectoris: Secondary | ICD-10-CM | POA: Diagnosis not present

## 2013-09-15 DIAGNOSIS — J449 Chronic obstructive pulmonary disease, unspecified: Secondary | ICD-10-CM | POA: Diagnosis not present

## 2013-09-15 DIAGNOSIS — Z87891 Personal history of nicotine dependence: Secondary | ICD-10-CM | POA: Diagnosis not present

## 2013-09-15 DIAGNOSIS — I2699 Other pulmonary embolism without acute cor pulmonale: Secondary | ICD-10-CM | POA: Diagnosis not present

## 2013-09-15 DIAGNOSIS — K5900 Constipation, unspecified: Secondary | ICD-10-CM | POA: Diagnosis not present

## 2013-09-15 DIAGNOSIS — F411 Generalized anxiety disorder: Secondary | ICD-10-CM | POA: Diagnosis present

## 2013-09-15 DIAGNOSIS — D62 Acute posthemorrhagic anemia: Secondary | ICD-10-CM | POA: Diagnosis not present

## 2013-09-15 DIAGNOSIS — G2581 Restless legs syndrome: Secondary | ICD-10-CM | POA: Diagnosis present

## 2013-09-15 DIAGNOSIS — J438 Other emphysema: Secondary | ICD-10-CM | POA: Diagnosis not present

## 2013-09-15 DIAGNOSIS — M109 Gout, unspecified: Secondary | ICD-10-CM | POA: Diagnosis present

## 2013-09-15 DIAGNOSIS — E785 Hyperlipidemia, unspecified: Secondary | ICD-10-CM | POA: Diagnosis present

## 2013-09-15 DIAGNOSIS — R141 Gas pain: Secondary | ICD-10-CM | POA: Diagnosis not present

## 2013-09-15 DIAGNOSIS — Z7901 Long term (current) use of anticoagulants: Secondary | ICD-10-CM | POA: Diagnosis not present

## 2013-09-15 DIAGNOSIS — Z471 Aftercare following joint replacement surgery: Secondary | ICD-10-CM | POA: Diagnosis not present

## 2013-09-15 DIAGNOSIS — K219 Gastro-esophageal reflux disease without esophagitis: Secondary | ICD-10-CM | POA: Diagnosis not present

## 2013-09-15 DIAGNOSIS — Z5189 Encounter for other specified aftercare: Secondary | ICD-10-CM | POA: Diagnosis not present

## 2013-09-15 DIAGNOSIS — G8918 Other acute postprocedural pain: Secondary | ICD-10-CM | POA: Diagnosis not present

## 2013-09-15 DIAGNOSIS — M1710 Unilateral primary osteoarthritis, unspecified knee: Secondary | ICD-10-CM | POA: Diagnosis present

## 2013-09-15 DIAGNOSIS — M24569 Contracture, unspecified knee: Secondary | ICD-10-CM | POA: Diagnosis present

## 2013-09-15 DIAGNOSIS — R1110 Vomiting, unspecified: Secondary | ICD-10-CM | POA: Diagnosis not present

## 2013-09-15 DIAGNOSIS — E039 Hypothyroidism, unspecified: Secondary | ICD-10-CM | POA: Diagnosis not present

## 2013-09-15 DIAGNOSIS — J4489 Other specified chronic obstructive pulmonary disease: Secondary | ICD-10-CM | POA: Diagnosis not present

## 2013-09-15 DIAGNOSIS — I959 Hypotension, unspecified: Secondary | ICD-10-CM | POA: Diagnosis not present

## 2013-09-15 DIAGNOSIS — I6529 Occlusion and stenosis of unspecified carotid artery: Secondary | ICD-10-CM | POA: Diagnosis present

## 2013-09-15 DIAGNOSIS — S8990XA Unspecified injury of unspecified lower leg, initial encounter: Secondary | ICD-10-CM | POA: Diagnosis not present

## 2013-09-15 NOTE — Telephone Encounter (Signed)
Left message for patient about results of test. Instructed patient to call back if she had any questions or concerns.

## 2013-09-15 NOTE — Telephone Encounter (Signed)
Message copied by Eulis Manly on Tue Sep 15, 2013 11:17 AM ------      Message from: [name] R      Created: Mon Sep 14, 2013  4:57 PM       Call pt - LE venous doppler negative for blood clots ------

## 2013-09-17 ENCOUNTER — Telehealth: Payer: Self-pay

## 2013-09-17 NOTE — Telephone Encounter (Signed)
Patient called received message from [facility] in medical records patient needs clearance for knee surgery.Dr.Jordan already cleared you for knee surgery 06/19/13 when you had a normal myoview.Patient stated she is in [facility] hospital at present, had complete knee replacement 09/15/13 and is doing well.Advised to keep appointment 12/23/13 with Dr.Jordan and call earlier if needed.

## 2013-09-21 DIAGNOSIS — J438 Other emphysema: Secondary | ICD-10-CM | POA: Diagnosis not present

## 2013-09-21 DIAGNOSIS — K219 Gastro-esophageal reflux disease without esophagitis: Secondary | ICD-10-CM | POA: Diagnosis not present

## 2013-09-21 DIAGNOSIS — S8990XA Unspecified injury of unspecified lower leg, initial encounter: Secondary | ICD-10-CM | POA: Diagnosis not present

## 2013-09-21 DIAGNOSIS — Z5189 Encounter for other specified aftercare: Secondary | ICD-10-CM | POA: Diagnosis not present

## 2013-09-21 DIAGNOSIS — D62 Acute posthemorrhagic anemia: Secondary | ICD-10-CM | POA: Diagnosis not present

## 2013-09-21 DIAGNOSIS — M1710 Unilateral primary osteoarthritis, unspecified knee: Secondary | ICD-10-CM | POA: Diagnosis not present

## 2013-09-21 DIAGNOSIS — R279 Unspecified lack of coordination: Secondary | ICD-10-CM | POA: Diagnosis not present

## 2013-09-21 DIAGNOSIS — Z96659 Presence of unspecified artificial knee joint: Secondary | ICD-10-CM | POA: Diagnosis not present

## 2013-09-21 DIAGNOSIS — K5900 Constipation, unspecified: Secondary | ICD-10-CM | POA: Diagnosis not present

## 2013-09-21 DIAGNOSIS — Z471 Aftercare following joint replacement surgery: Secondary | ICD-10-CM | POA: Diagnosis not present

## 2013-09-21 DIAGNOSIS — Z86718 Personal history of other venous thrombosis and embolism: Secondary | ICD-10-CM | POA: Diagnosis not present

## 2013-09-21 DIAGNOSIS — J449 Chronic obstructive pulmonary disease, unspecified: Secondary | ICD-10-CM | POA: Diagnosis not present

## 2013-09-21 DIAGNOSIS — E039 Hypothyroidism, unspecified: Secondary | ICD-10-CM | POA: Diagnosis not present

## 2013-09-21 DIAGNOSIS — I2510 Atherosclerotic heart disease of native coronary artery without angina pectoris: Secondary | ICD-10-CM | POA: Diagnosis not present

## 2013-09-21 DIAGNOSIS — M25469 Effusion, unspecified knee: Secondary | ICD-10-CM | POA: Diagnosis not present

## 2013-09-21 DIAGNOSIS — I10 Essential (primary) hypertension: Secondary | ICD-10-CM | POA: Diagnosis not present

## 2013-09-21 DIAGNOSIS — R110 Nausea: Secondary | ICD-10-CM | POA: Diagnosis not present

## 2013-09-22 DIAGNOSIS — Z96659 Presence of unspecified artificial knee joint: Secondary | ICD-10-CM | POA: Diagnosis not present

## 2013-09-22 DIAGNOSIS — R279 Unspecified lack of coordination: Secondary | ICD-10-CM | POA: Diagnosis not present

## 2013-09-22 DIAGNOSIS — K5900 Constipation, unspecified: Secondary | ICD-10-CM | POA: Diagnosis not present

## 2013-09-22 DIAGNOSIS — I2510 Atherosclerotic heart disease of native coronary artery without angina pectoris: Secondary | ICD-10-CM | POA: Diagnosis not present

## 2013-10-01 DIAGNOSIS — M1710 Unilateral primary osteoarthritis, unspecified knee: Secondary | ICD-10-CM | POA: Diagnosis not present

## 2013-10-01 DIAGNOSIS — Z471 Aftercare following joint replacement surgery: Secondary | ICD-10-CM | POA: Diagnosis not present

## 2013-10-01 DIAGNOSIS — Z96659 Presence of unspecified artificial knee joint: Secondary | ICD-10-CM | POA: Diagnosis not present

## 2013-10-27 ENCOUNTER — Other Ambulatory Visit: Payer: Self-pay | Admitting: Internal Medicine

## 2013-11-06 ENCOUNTER — Telehealth: Payer: Self-pay | Admitting: Internal Medicine

## 2013-11-06 NOTE — Telephone Encounter (Signed)
Patient Information:  Caller Name: Denita  Phone: [phone]  Patient: Christina Mckinney, Christina Mckinney  Gender: Female  DOB: Feb 02, 1932  Age: 79 Years  PCP: Artist Pais Doe-Hyun [name]) (Adults only)  Office Follow Up:  Does the office need to follow up with this patient?: No  Instructions For The Office: N/A   Symptoms  Reason For Call & Symptoms: Pt calling for antibiotic for cough and congestion. States she can barely get up. Explained that antibiotic could not be called in but I was happy to triage pt's sx and get her to the appropriate point of care. Pt declined. Triage offered again and declined. Pt will call back with any other needs.  Reviewed Health History In EMR: N/A  Reviewed Medications In EMR: N/A  Reviewed Allergies In EMR: N/A  Reviewed Surgeries / Procedures: N/A  Date of Onset of Symptoms: 09/25/2013  Guideline(s) Used:  Cough  No Protocol Available - Sick Adult  Disposition Per Guideline:   See Today or Tomorrow in Office  Reason For Disposition Reached:   Nursing judgment  Advice Given:  N/A  Patient Refused Recommendation:  Patient Will Follow Up With Office Later  Pt upset that antibiotic will not be called in w/out triage or seeing pt.

## 2013-11-07 DIAGNOSIS — J329 Chronic sinusitis, unspecified: Secondary | ICD-10-CM | POA: Diagnosis not present

## 2013-11-23 ENCOUNTER — Encounter: Payer: Self-pay | Admitting: Internal Medicine

## 2013-11-23 ENCOUNTER — Ambulatory Visit (INDEPENDENT_AMBULATORY_CARE_PROVIDER_SITE_OTHER): Payer: Medicare Other | Admitting: Internal Medicine

## 2013-11-23 VITALS — BP 112/54 | Temp 98.4°F | Ht 61.0 in | Wt 158.0 lb

## 2013-11-23 DIAGNOSIS — I10 Essential (primary) hypertension: Secondary | ICD-10-CM

## 2013-11-23 DIAGNOSIS — E039 Hypothyroidism, unspecified: Secondary | ICD-10-CM | POA: Diagnosis not present

## 2013-11-23 DIAGNOSIS — Z79899 Other long term (current) drug therapy: Secondary | ICD-10-CM | POA: Diagnosis not present

## 2013-11-23 DIAGNOSIS — F411 Generalized anxiety disorder: Secondary | ICD-10-CM

## 2013-11-23 DIAGNOSIS — D649 Anemia, unspecified: Secondary | ICD-10-CM

## 2013-11-23 LAB — CBC WITH DIFFERENTIAL/PLATELET
BASOS ABS: 0 10*3/uL (ref 0.0–0.1)
BASOS PCT: 0.5 % (ref 0.0–3.0)
EOS PCT: 1.2 % (ref 0.0–5.0)
Eosinophils Absolute: 0.1 10*3/uL (ref 0.0–0.7)
HCT: 36.1 % (ref 36.0–46.0)
Hemoglobin: 12.1 g/dL (ref 12.0–15.0)
LYMPHS ABS: 1.9 10*3/uL (ref 0.7–4.0)
LYMPHS PCT: 25.6 % (ref 12.0–46.0)
MCHC: 33.4 g/dL (ref 30.0–36.0)
MCV: 89 fl (ref 78.0–100.0)
Monocytes Absolute: 0.5 10*3/uL (ref 0.1–1.0)
Monocytes Relative: 5.9 % (ref 3.0–12.0)
Neutro Abs: 5.1 10*3/uL (ref 1.4–7.7)
Neutrophils Relative %: 66.8 % (ref 43.0–77.0)
Platelets: 263 10*3/uL (ref 150.0–400.0)
RBC: 4.06 Mil/uL (ref 3.87–5.11)
RDW: 14.5 % (ref 11.5–14.6)
WBC: 7.6 10*3/uL (ref 4.5–10.5)

## 2013-11-23 LAB — BASIC METABOLIC PANEL
BUN: 17 mg/dL (ref 6–23)
CALCIUM: 10.1 mg/dL (ref 8.4–10.5)
CO2: 31 mEq/L (ref 19–32)
Chloride: 101 mEq/L (ref 96–112)
Creatinine, Ser: 1.2 mg/dL (ref 0.4–1.2)
GFR: 48.27 mL/min — AB (ref >60.00)
GLUCOSE: 105 mg/dL — AB (ref 70–99)
Potassium: 4.1 mEq/L (ref 3.5–5.1)
SODIUM: 138 meq/L (ref 135–145)

## 2013-11-23 MED ORDER — HYDROCODONE-ACETAMINOPHEN 5-325 MG PO TABS: 1.0000 | ORAL_TABLET | Freq: Four times a day (QID) | ORAL | Status: DC | PRN

## 2013-11-23 MED ORDER — METOPROLOL SUCCINATE ER 50 MG PO TB24: 50.0000 mg | ORAL_TABLET | Freq: Every day | ORAL | Status: DC

## 2013-11-23 MED ORDER — ALPRAZOLAM 0.25 MG PO TABS: 0.2500 mg | ORAL_TABLET | Freq: Two times a day (BID) | ORAL | Status: DC | PRN

## 2013-11-23 MED ORDER — LEVOTHYROXINE SODIUM 50 MCG PO TABS
50.0000 ug | ORAL_TABLET | Freq: Every day | ORAL | Status: DC
Start: 2013-11-23 — End: 2014-05-19

## 2013-11-23 NOTE — Progress Notes (Signed)
Subjective:    Patient ID: Christina Mckinney, female    DOB: Apr 04, 1932, 80 y.o.   MRN: [id number]  HPI  80 year old white female with history of hypertension, pulmonary embolism and end-stage osteoarthritis of right knee for followup. Interval medical history-patient underwent right knee replacement on 09/15/2013 at [facility]. She was discharged to rehabilitation center ([facility]) and did well postoperatively. She denies any postoperative complications. She was on warfarin for 6 weeks after surgery. She did not have any bleeding complications.  Patient not aware of significant blood loss during knee replacement. She denies any unusual fatigue or shortness of breath. Patient was able to lose significant amount of weight since previous visit.  Chronic anxiety disorder-stable. She takes alprazolam 0.25 mg twice a day as needed.  Hypertension - patient reports transient hypotension postoperatively. Her losartan dose was reduced to 50 mg.   Review of Systems Negative for chest pain or shortness of breath    Past Medical History  Diagnosis Date  . Depressive disorder, not elsewhere classified   . Diverticulitis   . GERD (gastroesophageal reflux disease)   . Other and unspecified hyperlipidemia   . Unspecified essential hypertension   . Other abnormal glucose   . Unspecified hypothyroidism   . Personal history of colonic polyps   . Anxiety state, unspecified   . Personal history of peptic ulcer disease   . Osteoarthritis   . Gout, unspecified   . Essential and other specified forms of tremor   . Internal hemorrhoids without mention of complication   . Anemia, unspecified   . Unspecified disorder of bladder   . Pulmonary embolism   . DVT (deep venous thrombosis)   . Varicose veins     History   Social History  . Marital Status: Widowed    Spouse Name: N/A    Number of Children: 3  . Years of Education: N/A   Occupational History  . Retired     Social History Main  Topics  . Smoking status: Former Smoker    Types: Cigarettes    Quit date: 11/12/1988  . Smokeless tobacco: Never Used  . Alcohol Use: 2.0 oz/week    4 drink(s) per week     Comment: occasional wine   . Drug Use: No  . Sexual Activity: Not on file   Other Topics Concern  . Not on file   Social History Narrative  . No narrative on file    Past Surgical History  Procedure Laterality Date  . Cholecystectomy    . Abdominal hysterectomy    . Nasal septum surgery    . Shoulder surgery    . Knee arthroscopy      right   . Eye surgery    . Endovenous ablation saphenous vein w/ laser  09-11-2012    left greater saphenous vein   by Curt Jews MD  . Total knee arthroplasty  09/2013    Right    Family History  Problem Relation Age of Onset  . Colon cancer Neg Hx   . Other Mother     varicose veins  . Heart disease Father   . Hyperlipidemia Father   . Heart attack Father   . Diabetes Daughter   . Heart disease Daughter   . Hyperlipidemia Daughter   . Hypertension Daughter     Allergies  Allergen Reactions  . Crestor [Rosuvastatin]     Left hip pain  . Lipitor [Atorvastatin]     Pain in hips  .  Red Dye     Current Outpatient Prescriptions on File Prior to Visit  Medication Sig Dispense Refill  . aspirin 81 MG tablet Take 1 tablet (81 mg total) by mouth daily.  30 tablet    . azelastine (ASTELIN) 137 MCG/SPRAY nasal spray Place 1 spray into the nose daily as needed. Use in each nostril as directed      . Cholecalciferol (VITAMIN D) 2000 UNITS tablet Take 2,000 Units by mouth daily.        Marland Kitchen co-enzyme Q-10 30 MG capsule Take 30 mg by mouth 3 (three) times daily.        Marland Kitchen darifenacin (ENABLEX) 15 MG 24 hr tablet Take 15 mg by mouth daily.      . diazepam (VALIUM) 2 MG tablet Take 1 tablet (2 mg total) by mouth every 8 (eight) hours as needed (vertigo).  30 tablet  0  . diclofenac sodium (VOLTAREN) 1 % GEL Apply 1 application topically 4 (four) times daily. Apply 2 grams  qid.  2 Tube  2  . docusate sodium (COLACE) 100 MG capsule Take 1 capsule (100 mg total) by mouth every 12 (twelve) hours.  60 capsule  0  . fluticasone (FLONASE) 50 MCG/ACT nasal spray Place 2 sprays into the nose daily. Allergies      . furosemide (LASIX) 40 MG tablet TAKE 1 TABLET DAILY AS DIRECTED  90 tablet  3  . gabapentin (NEURONTIN) 300 MG capsule TAKE 1 CAPSULE DAILY  90 capsule  3  . Glucosamine-Chondroitin 500-250 MG CAPS Take 1 capsule by mouth at bedtime.       . lansoprazole (PREVACID) 30 MG capsule TAKE 1 CAPSULE TWICE A DAY  180 capsule  0  . losartan (COZAAR) 50 MG tablet Take 1 tablet (50 mg total) by mouth daily.  90 tablet  1  . lubiprostone (AMITIZA) 8 MCG capsule Take 1 capsule (8 mcg total) by mouth 2 (two) times daily with a meal.  180 capsule  1  . methocarbamol (ROBAXIN) 500 MG tablet Take 1 tablet (500 mg total) by mouth 2 (two) times daily as needed (pain).  90 tablet  0  . QVAR 80 MCG/ACT inhaler INHALE 2 PUFFS INTO THE LUNGS TWICE A DAY  3 g  5  . rOPINIRole (REQUIP) 1 MG tablet Take 1 tablet (1 mg total) by mouth at bedtime.  90 tablet  1  . triamcinolone cream (KENALOG) 0.1 % Apply topically 2 (two) times daily.  30 g  0  . ULORIC 40 MG tablet TAKE 1 TABLET DAILY  90 tablet  1  . [DISCONTINUED] propranolol (INDERAL) 60 MG tablet Take 1 tablet (60 mg total) by mouth 2 (two) times daily.  60 tablet  3   No current facility-administered medications on file prior to visit.    BP 112/54  Temp(Src) 98.4 F (36.9 C) (Oral)  Ht 5\' 1"  (1.549 m)  Wt 158 lb (71.668 kg)  BMI 29.87 kg/m2    Objective:   Physical Exam  Constitutional: She is oriented to person, place, and time. She appears well-developed and well-nourished. No distress.  HENT:  Head: Normocephalic and atraumatic.  Neck: Neck supple.  Cardiovascular: Normal rate, regular rhythm and normal heart sounds.   No murmur heard. Pulmonary/Chest: Effort normal and breath sounds normal. She has no wheezes.    Musculoskeletal: She exhibits no edema.  Lymphadenopathy:    She has no cervical adenopathy.  Neurological: She is alert and oriented to person, place, and  time. No cranial nerve deficit.  Psychiatric: She has a normal mood and affect. Her behavior is normal.       Assessment & Plan:

## 2013-11-23 NOTE — Assessment & Plan Note (Signed)
Monitor thyroid function tests. Adjust levothyroxine dose accordingly. Lab Results  Component Value Date   TSH 1.95 08/25/2013

## 2013-11-23 NOTE — Assessment & Plan Note (Signed)
No change.  Continue same dose of alprazolam as needed.

## 2013-11-23 NOTE — Progress Notes (Signed)
[facility] report did not print

## 2013-11-23 NOTE — Assessment & Plan Note (Addendum)
Patient is status post total right knee replacement in November of 2014. Obtain CBC to monitor for post op anemia.

## 2013-11-23 NOTE — Assessment & Plan Note (Addendum)
BP is stable.  Monitor electrolytes and kidney function. BP: 112/54 mmHg  Patient reports experiencing transient hypotension after her right knee replacement in December of 2014.

## 2013-11-27 DIAGNOSIS — J329 Chronic sinusitis, unspecified: Secondary | ICD-10-CM | POA: Diagnosis not present

## 2013-12-01 ENCOUNTER — Telehealth: Payer: Self-pay | Admitting: Internal Medicine

## 2013-12-01 ENCOUNTER — Telehealth: Payer: Self-pay | Admitting: *Deleted

## 2013-12-01 NOTE — Telephone Encounter (Signed)
Med list updated

## 2013-12-01 NOTE — Telephone Encounter (Signed)
New message           Pt would like for nurse to give her call. Pt would not release any other information.

## 2013-12-14 DIAGNOSIS — M76899 Other specified enthesopathies of unspecified lower limb, excluding foot: Secondary | ICD-10-CM | POA: Diagnosis not present

## 2013-12-14 DIAGNOSIS — M169 Osteoarthritis of hip, unspecified: Secondary | ICD-10-CM | POA: Diagnosis not present

## 2013-12-14 DIAGNOSIS — M25559 Pain in unspecified hip: Secondary | ICD-10-CM | POA: Diagnosis not present

## 2013-12-14 DIAGNOSIS — M1990 Unspecified osteoarthritis, unspecified site: Secondary | ICD-10-CM | POA: Diagnosis not present

## 2013-12-14 DIAGNOSIS — Z96659 Presence of unspecified artificial knee joint: Secondary | ICD-10-CM | POA: Diagnosis not present

## 2013-12-14 DIAGNOSIS — Z471 Aftercare following joint replacement surgery: Secondary | ICD-10-CM | POA: Diagnosis not present

## 2013-12-14 DIAGNOSIS — M5137 Other intervertebral disc degeneration, lumbosacral region: Secondary | ICD-10-CM | POA: Diagnosis not present

## 2013-12-14 DIAGNOSIS — M1610 Unilateral primary osteoarthritis, unspecified hip: Secondary | ICD-10-CM | POA: Diagnosis not present

## 2013-12-14 DIAGNOSIS — M25469 Effusion, unspecified knee: Secondary | ICD-10-CM | POA: Diagnosis not present

## 2013-12-14 DIAGNOSIS — M7989 Other specified soft tissue disorders: Secondary | ICD-10-CM | POA: Diagnosis not present

## 2013-12-14 DIAGNOSIS — M948X9 Other specified disorders of cartilage, unspecified sites: Secondary | ICD-10-CM | POA: Diagnosis not present

## 2013-12-15 ENCOUNTER — Telehealth: Payer: Self-pay | Admitting: Internal Medicine

## 2013-12-15 NOTE — Telephone Encounter (Signed)
Relevant patient education mailed to patient.  

## 2013-12-16 ENCOUNTER — Encounter: Payer: Self-pay | Admitting: Internal Medicine

## 2013-12-16 ENCOUNTER — Encounter (HOSPITAL_COMMUNITY): Payer: Self-pay | Admitting: Internal Medicine

## 2013-12-23 ENCOUNTER — Ambulatory Visit: Payer: Medicare Other | Admitting: Cardiology

## 2013-12-23 DIAGNOSIS — J329 Chronic sinusitis, unspecified: Secondary | ICD-10-CM | POA: Diagnosis not present

## 2013-12-23 DIAGNOSIS — J310 Chronic rhinitis: Secondary | ICD-10-CM | POA: Diagnosis not present

## 2014-01-13 ENCOUNTER — Ambulatory Visit (HOSPITAL_COMMUNITY): Payer: Medicare Other | Attending: Internal Medicine

## 2014-01-13 ENCOUNTER — Other Ambulatory Visit (HOSPITAL_COMMUNITY): Payer: Self-pay | Admitting: Cardiology

## 2014-01-13 DIAGNOSIS — I6529 Occlusion and stenosis of unspecified carotid artery: Secondary | ICD-10-CM | POA: Insufficient documentation

## 2014-01-30 ENCOUNTER — Other Ambulatory Visit: Payer: Self-pay | Admitting: Internal Medicine

## 2014-02-02 ENCOUNTER — Other Ambulatory Visit: Payer: Self-pay | Admitting: Internal Medicine

## 2014-02-04 ENCOUNTER — Encounter: Payer: Self-pay | Admitting: Cardiology

## 2014-02-04 ENCOUNTER — Ambulatory Visit (INDEPENDENT_AMBULATORY_CARE_PROVIDER_SITE_OTHER): Payer: Medicare Other | Admitting: Cardiology

## 2014-02-04 VITALS — BP 118/64 | HR 79 | Ht 61.0 in | Wt 157.0 lb

## 2014-02-04 DIAGNOSIS — I10 Essential (primary) hypertension: Secondary | ICD-10-CM | POA: Diagnosis not present

## 2014-02-04 DIAGNOSIS — R079 Chest pain, unspecified: Secondary | ICD-10-CM

## 2014-02-04 DIAGNOSIS — I6529 Occlusion and stenosis of unspecified carotid artery: Secondary | ICD-10-CM | POA: Diagnosis not present

## 2014-02-04 DIAGNOSIS — E785 Hyperlipidemia, unspecified: Secondary | ICD-10-CM

## 2014-02-04 NOTE — Patient Instructions (Signed)
Continue your current therapy  I will see you in one year   

## 2014-02-04 NOTE — Progress Notes (Signed)
Christina Mckinney Date of Birth: 1932/11/09 Medical Record #[id number]  History of Present Illness: Christina Mckinney is seen for follow up. She has a history of hypertension, hyperlipidemia, and carotid arterial disease. She was evaluated last year for chest pain and had a normal myoview study. From a cardiac standpoint she has done well. She denies any recurrent chest pain.  She did have a cardiac catheterization at [facility] in 2001 and this was apparently normal. She did have successful TKR in Nov.  Current Outpatient Prescriptions on File Prior to Visit  Medication Sig Dispense Refill  . ALPRAZolam (XANAX) 0.25 MG tablet Take 1 tablet (0.25 mg total) by mouth 2 (two) times daily as needed for anxiety.  60 tablet  3  . aspirin 81 MG tablet Take 1 tablet (81 mg total) by mouth daily.  30 tablet    . azelastine (ASTELIN) 137 MCG/SPRAY nasal spray Place 1 spray into the nose daily as needed. Use in each nostril as directed      . Cholecalciferol (VITAMIN D) 2000 UNITS tablet Take 2,000 Units by mouth daily.        Marland Kitchen co-enzyme Q-10 30 MG capsule Take 30 mg by mouth daily.       . diazepam (VALIUM) 2 MG tablet Take 1 tablet (2 mg total) by mouth Mckinney 8 (eight) hours as needed (vertigo).  30 tablet  0  . fluticasone (FLONASE) 50 MCG/ACT nasal spray Place 2 sprays into the nose as needed. Allergies      . furosemide (LASIX) 40 MG tablet TAKE 1 TABLET DAILY AS DIRECTED  90 tablet  3  . gabapentin (NEURONTIN) 300 MG capsule TAKE 1 CAPSULE DAILY  90 capsule  3  . Glucosamine-Chondroitin 500-250 MG CAPS Take 1 capsule by mouth as needed.       Marland Kitchen HYDROcodone-acetaminophen (NORCO/VICODIN) 5-325 MG per tablet Take 1 tablet by mouth Mckinney 6 (six) hours as needed.  90 tablet  0  . lansoprazole (PREVACID) 30 MG capsule TAKE 1 CAPSULE TWICE A DAY  180 capsule  0  . levothyroxine (SYNTHROID, LEVOTHROID) 50 MCG tablet Take 1 tablet (50 mcg total) by mouth daily before breakfast.  90 tablet  1  .  losartan (COZAAR) 50 MG tablet TAKE 1 TABLET DAILY  90 tablet  0  . methocarbamol (ROBAXIN) 500 MG tablet Take 1 tablet (500 mg total) by mouth 2 (two) times daily as needed (pain).  90 tablet  0  . metoprolol succinate (TOPROL-XL) 50 MG 24 hr tablet Take 1 tablet (50 mg total) by mouth daily. Take with or immediately following a meal.  90 tablet  1  . rOPINIRole (REQUIP) 1 MG tablet TAKE 1 TABLET AT BEDTIME  90 tablet  0  . ULORIC 40 MG tablet TAKE 1 TABLET DAILY  90 tablet  3  . [DISCONTINUED] propranolol (INDERAL) 60 MG tablet Take 1 tablet (60 mg total) by mouth 2 (two) times daily.  60 tablet  3   No current facility-administered medications on file prior to visit.    Allergies  Allergen Reactions  . Crestor [Rosuvastatin]     Left hip pain  . Lipitor [Atorvastatin]     Pain in hips  . Red Dye     Past Medical History  Diagnosis Date  . Depressive disorder, not elsewhere classified   . Diverticulitis   . GERD (gastroesophageal reflux disease)   . Other and unspecified hyperlipidemia   . Unspecified essential hypertension   .  Other abnormal glucose   . Unspecified hypothyroidism   . Personal history of colonic polyps   . Anxiety state, unspecified   . Personal history of peptic ulcer disease   . Osteoarthritis   . Gout, unspecified   . Essential and other specified forms of tremor   . Internal hemorrhoids without mention of complication   . Anemia, unspecified   . Unspecified disorder of bladder   . Pulmonary embolism   . DVT (deep venous thrombosis)   . Varicose veins   . Chest pain     Past Surgical History  Procedure Laterality Date  . Cholecystectomy    . Abdominal hysterectomy    . Nasal septum surgery    . Shoulder surgery    . Knee arthroscopy      right   . Eye surgery    . Endovenous ablation saphenous vein w/ laser  09-11-2012    left greater saphenous vein   by Curt Jews MD  . Total knee arthroplasty  09/2013    Right    History  Smoking  status  . Former Smoker  . Types: Cigarettes  . Quit date: 11/12/1988  Smokeless tobacco  . Never Used    History  Alcohol Use  . 2.0 oz/week  . 4 drink(s) per week    Comment: occasional wine     Family History  Problem Relation Age of Onset  . Colon cancer Neg Hx   . Other Mother     varicose veins  . Heart disease Father   . Hyperlipidemia Father   . Heart attack Father   . Diabetes Daughter   . Heart disease Daughter   . Hyperlipidemia Daughter   . Hypertension Daughter     Review of Systems: The review of systems is positive for chronic arthritis. All other systems were reviewed and are negative.  Physical Exam: BP 118/64  Pulse 79  Ht 5\' 1"  (1.549 m)  Wt 157 lb (71.215 kg)  BMI 29.68 kg/m2  SpO2 94% She is a pleasant white female in no acute distress. HEENT: Normocephalic, atraumatic. Pupils equal round and reactive to light and accommodation. Extraocular movements are full. Sclera are clear. Oropharynx is clear. Neck is supple without JVD, adenopathy, thyromegaly, or bruits. Lungs: Clear Cardiovascular: Regular rate and rhythm, normal S1 and S2, no gallop, murmur, or click. Abdomen: Soft and nontender. No masses or hepatosplenomegaly. Bowel sounds are positive. Extremities: Arthritic changes in her hands, knees, and feet. No edema. Pulses are 2+. Skin: Warm and dry Neuro: Alert and oriented x3. Cranial nerves II through XII are intact.  LABORATORY DATA: Recent ECG was normal. Lab Results  Component Value Date   WBC 7.6 11/23/2013   HGB 12.1 11/23/2013   HCT 36.1 11/23/2013   PLT 263.0 11/23/2013   GLUCOSE 105* 11/23/2013   CHOL 190 11/03/2012   TRIG 133.0 11/03/2012   HDL 49.80 11/03/2012   LDLCALC 114* 11/03/2012   ALT 10 08/25/2013   AST 26 08/25/2013   NA 138 11/23/2013   K 4.1 11/23/2013   CL 101 11/23/2013   CREATININE 1.2 11/23/2013   BUN 17 11/23/2013   CO2 31 11/23/2013   TSH 1.95 08/25/2013   INR 1.0 07/21/2010   HGBA1C 5.8 01/14/2012   Recent  carotid Dopplers demonstrated moderate bilateral carotid disease-stable.  Assessment / Plan: 1. Chest pain. Resolved. Patient does have multiple cardiac risk factors including hypertension, hyperlipidemia, and carotid arterial disease. Myoview study in August was normal.  2. Hypertension-controlled  3. Hyperlipidemia. Patient is intolerant to statin therapy. Consider PSCK 9 therapy when it is available.  4. Carotid arterial disease. Stable. Follow up in one year.

## 2014-02-22 ENCOUNTER — Encounter: Payer: Self-pay | Admitting: Internal Medicine

## 2014-02-22 ENCOUNTER — Ambulatory Visit (INDEPENDENT_AMBULATORY_CARE_PROVIDER_SITE_OTHER): Payer: Medicare Other | Admitting: Internal Medicine

## 2014-02-22 VITALS — BP 122/62 | HR 72 | Temp 98.3°F | Ht 61.0 in | Wt 157.0 lb

## 2014-02-22 DIAGNOSIS — H8110 Benign paroxysmal vertigo, unspecified ear: Secondary | ICD-10-CM

## 2014-02-22 DIAGNOSIS — E039 Hypothyroidism, unspecified: Secondary | ICD-10-CM

## 2014-02-22 DIAGNOSIS — G2581 Restless legs syndrome: Secondary | ICD-10-CM

## 2014-02-22 DIAGNOSIS — I6529 Occlusion and stenosis of unspecified carotid artery: Secondary | ICD-10-CM | POA: Diagnosis not present

## 2014-02-22 DIAGNOSIS — M109 Gout, unspecified: Secondary | ICD-10-CM | POA: Diagnosis not present

## 2014-02-22 DIAGNOSIS — N189 Chronic kidney disease, unspecified: Secondary | ICD-10-CM | POA: Diagnosis not present

## 2014-02-22 DIAGNOSIS — I10 Essential (primary) hypertension: Secondary | ICD-10-CM | POA: Diagnosis not present

## 2014-02-22 LAB — BASIC METABOLIC PANEL
BUN: 16 mg/dL (ref 6–23)
CALCIUM: 10 mg/dL (ref 8.4–10.5)
CHLORIDE: 100 meq/L (ref 96–112)
CO2: 29 meq/L (ref 19–32)
CREATININE: 1 mg/dL (ref 0.4–1.2)
GFR: 59.42 mL/min — ABNORMAL LOW (ref >60.00)
GLUCOSE: 86 mg/dL (ref 70–99)
Potassium: 4.1 mEq/L (ref 3.5–5.1)
Sodium: 139 mEq/L (ref 135–145)

## 2014-02-22 LAB — TSH: TSH: 1.96 u[IU]/mL (ref 0.35–5.50)

## 2014-02-22 LAB — URIC ACID: Uric Acid, Serum: 4.9 mg/dL (ref 2.4–7.0)

## 2014-02-22 MED ORDER — DIAZEPAM 2 MG PO TABS: 2.0000 mg | ORAL_TABLET | Freq: Three times a day (TID) | ORAL | Status: DC | PRN

## 2014-02-22 MED ORDER — CYCLOSPORINE 0.05 % OP EMUL: 1.0000 [drp] | Freq: Two times a day (BID) | OPHTHALMIC | Status: DC

## 2014-02-22 MED ORDER — TRAMADOL HCL 50 MG PO TABS: 50.0000 mg | ORAL_TABLET | Freq: Every day | ORAL | Status: DC | PRN

## 2014-02-22 NOTE — Assessment & Plan Note (Signed)
I doubt her lower extremity symptoms secondary to gout. Continue Uloric. Monitor uric acid level.

## 2014-02-22 NOTE — Assessment & Plan Note (Signed)
Her blood pressure stable. Her last serum creatinine showed slight increased to 1.2. I stressed importance of avoiding dehydration and over-the-counter NSAIDs. She has been using OTC NSAIDs for musculoskeletal pain. Switch to tramadol 50 mg and acetaminophen 325 mg once daily as needed. BP: 122/62 mmHg

## 2014-02-22 NOTE — Progress Notes (Signed)
Subjective:    Patient ID: Christina Mckinney, female    DOB: Apr 16, 1932, 80 y.o.   MRN: [id number]  HPI  80 year old white female with history of hypertension, hypothyroidism, mild renal insufficiency and history of PE for followup. Interval medical history-patient was seen by cardiologist regarding followup for chronic carotid artery stenosis. Her followup scan is stable. She is statin intolerant.    Restless leg syndrome - patient reports persistent symptoms despite using gabapentin and Requip 1 mg.  Patient reports her symptoms are worse at night. Sometimes her skin on her feet appear red/flushed.  Her feet also feel cold.  Vertigo - she is experiencing intermittent symptoms.  She does not want to go back to Vestibular rehabilitation.  Some improvement with taking low dose Valium.  Review of Systems Chronic insomnia, negative for chest pain or shortness of breath.    Past Medical History  Diagnosis Date  . Depressive disorder, not elsewhere classified   . Diverticulitis   . GERD (gastroesophageal reflux disease)   . Other and unspecified hyperlipidemia   . Unspecified essential hypertension   . Other abnormal glucose   . Unspecified hypothyroidism   . Personal history of colonic polyps   . Anxiety state, unspecified   . Personal history of peptic ulcer disease   . Osteoarthritis   . Gout, unspecified   . Essential and other specified forms of tremor   . Internal hemorrhoids without mention of complication   . Anemia, unspecified   . Unspecified disorder of bladder   . Pulmonary embolism   . DVT (deep venous thrombosis)   . Varicose veins   . Chest pain     History   Social History  . Marital Status: Widowed    Spouse Name: N/A    Number of Children: 3  . Years of Education: N/A   Occupational History  . Retired     Social History Main Topics  . Smoking status: Former Smoker    Types: Cigarettes    Quit date: 11/12/1988  . Smokeless tobacco: Never Used  .  Alcohol Use: 2.0 oz/week    4 drink(s) per week     Comment: occasional wine   . Drug Use: No  . Sexual Activity: Not on file   Other Topics Concern  . Not on file   Social History Narrative  . No narrative on file    Past Surgical History  Procedure Laterality Date  . Cholecystectomy    . Abdominal hysterectomy    . Nasal septum surgery    . Shoulder surgery    . Knee arthroscopy      right   . Eye surgery    . Endovenous ablation saphenous vein w/ laser  09-11-2012    left greater saphenous vein   by Curt Jews MD  . Total knee arthroplasty  09/2013    Right    Family History  Problem Relation Age of Onset  . Colon cancer Neg Hx   . Other Mother     varicose veins  . Heart disease Father   . Hyperlipidemia Father   . Heart attack Father   . Diabetes Daughter   . Heart disease Daughter   . Hyperlipidemia Daughter   . Hypertension Daughter     Allergies  Allergen Reactions  . Crestor [Rosuvastatin]     Left hip pain  . Lipitor [Atorvastatin]     Pain in hips  . Red Dye     Current Outpatient Prescriptions  on File Prior to Visit  Medication Sig Dispense Refill  . ALPRAZolam (XANAX) 0.25 MG tablet Take 1 tablet (0.25 mg total) by mouth 2 (two) times daily as needed for anxiety.  60 tablet  3  . aspirin 81 MG tablet Take 1 tablet (81 mg total) by mouth daily.  30 tablet    . azelastine (ASTELIN) 137 MCG/SPRAY nasal spray Place 1 spray into the nose daily as needed. Use in each nostril as directed      . Cholecalciferol (VITAMIN D) 2000 UNITS tablet Take 2,000 Units by mouth daily.        Marland Kitchen co-enzyme Q-10 30 MG capsule Take 30 mg by mouth daily.       . diazepam (VALIUM) 2 MG tablet Take 1 tablet (2 mg total) by mouth every 8 (eight) hours as needed (vertigo).  30 tablet  0  . diclofenac sodium (VOLTAREN) 1 % GEL Apply 1 application topically as needed.      . fluticasone (FLONASE) 50 MCG/ACT nasal spray Place 2 sprays into the nose as needed. Allergies      .  furosemide (LASIX) 40 MG tablet TAKE 1 TABLET DAILY AS DIRECTED  90 tablet  3  . gabapentin (NEURONTIN) 300 MG capsule TAKE 1 CAPSULE DAILY  90 capsule  3  . Glucosamine-Chondroitin 500-250 MG CAPS Take 1 capsule by mouth as needed.       Marland Kitchen HYDROcodone-acetaminophen (NORCO/VICODIN) 5-325 MG per tablet Take 1 tablet by mouth every 6 (six) hours as needed.  90 tablet  0  . lansoprazole (PREVACID) 30 MG capsule TAKE 1 CAPSULE TWICE A DAY  180 capsule  0  . levothyroxine (SYNTHROID, LEVOTHROID) 50 MCG tablet Take 1 tablet (50 mcg total) by mouth daily before breakfast.  90 tablet  1  . losartan (COZAAR) 50 MG tablet TAKE 1 TABLET DAILY  90 tablet  0  . methocarbamol (ROBAXIN) 500 MG tablet Take 1 tablet (500 mg total) by mouth 2 (two) times daily as needed (pain).  90 tablet  0  . metoprolol succinate (TOPROL-XL) 50 MG 24 hr tablet Take 1 tablet (50 mg total) by mouth daily. Take with or immediately following a meal.  90 tablet  1  . rOPINIRole (REQUIP) 1 MG tablet TAKE 1 TABLET AT BEDTIME  90 tablet  0  . ULORIC 40 MG tablet TAKE 1 TABLET DAILY  90 tablet  3  . [DISCONTINUED] propranolol (INDERAL) 60 MG tablet Take 1 tablet (60 mg total) by mouth 2 (two) times daily.  60 tablet  3   No current facility-administered medications on file prior to visit.    BP 122/62  Pulse 72  Temp(Src) 98.3 F (36.8 C) (Oral)  Ht 5\' 1"  (1.549 m)  Wt 157 lb (71.215 kg)  BMI 29.68 kg/m2    Objective:   Physical Exam  Constitutional: She is oriented to person, place, and time. She appears well-developed and well-nourished.  HENT:  Head: Normocephalic and atraumatic.  Eyes: EOM are normal. Pupils are equal, round, and reactive to light.  Cardiovascular: Normal rate, regular rhythm, normal heart sounds and intact distal pulses.   Pulmonary/Chest: Effort normal and breath sounds normal. She has no wheezes.  Musculoskeletal:  Trace lower extremity edema  Neurological: She is alert and oriented to person,  place, and time. No cranial nerve deficit.  Skin: Skin is warm and dry.  Skin of feet cool to touch.  Psychiatric: She has a normal mood and affect. Her behavior is  normal.          Assessment & Plan:

## 2014-02-22 NOTE — Assessment & Plan Note (Signed)
Patient having persistent intermittent symptoms. Patient advised to continue home exercises. She declines to go back to vestibular rehabilitation. Use diazepam as needed.

## 2014-02-22 NOTE — Progress Notes (Signed)
Pre visit review using our clinic review tool, if applicable. No additional management support is needed unless otherwise documented below in the visit note. 

## 2014-02-22 NOTE — Patient Instructions (Signed)
Avoid dehydration and OTC NSAIDs

## 2014-02-22 NOTE — Assessment & Plan Note (Signed)
Discontinue gabapentin. Continue Requip. Patient experiencing abdominal temperature sensation. Question dysautonomia. Patient to try alternative therapy-acupuncture.

## 2014-02-22 NOTE — Assessment & Plan Note (Signed)
Followed by cardiology-Dr. Martinique. Her carotid Dopplers showed stable disease.  She is intolerant to statins.

## 2014-03-25 DIAGNOSIS — M25469 Effusion, unspecified knee: Secondary | ICD-10-CM | POA: Diagnosis not present

## 2014-03-25 DIAGNOSIS — M76899 Other specified enthesopathies of unspecified lower limb, excluding foot: Secondary | ICD-10-CM | POA: Diagnosis not present

## 2014-03-25 DIAGNOSIS — Z96659 Presence of unspecified artificial knee joint: Secondary | ICD-10-CM | POA: Diagnosis not present

## 2014-03-25 DIAGNOSIS — Z471 Aftercare following joint replacement surgery: Secondary | ICD-10-CM | POA: Diagnosis not present

## 2014-03-25 DIAGNOSIS — M1710 Unilateral primary osteoarthritis, unspecified knee: Secondary | ICD-10-CM | POA: Diagnosis not present

## 2014-03-31 ENCOUNTER — Other Ambulatory Visit: Payer: Self-pay | Admitting: Internal Medicine

## 2014-04-14 ENCOUNTER — Other Ambulatory Visit: Payer: Self-pay | Admitting: Internal Medicine

## 2014-04-14 DIAGNOSIS — Z01419 Encounter for gynecological examination (general) (routine) without abnormal findings: Secondary | ICD-10-CM | POA: Diagnosis not present

## 2014-04-14 DIAGNOSIS — M949 Disorder of cartilage, unspecified: Secondary | ICD-10-CM | POA: Diagnosis not present

## 2014-04-14 DIAGNOSIS — N959 Unspecified menopausal and perimenopausal disorder: Secondary | ICD-10-CM | POA: Diagnosis not present

## 2014-04-14 DIAGNOSIS — Z1231 Encounter for screening mammogram for malignant neoplasm of breast: Secondary | ICD-10-CM | POA: Diagnosis not present

## 2014-04-14 DIAGNOSIS — M899 Disorder of bone, unspecified: Secondary | ICD-10-CM | POA: Diagnosis not present

## 2014-04-16 ENCOUNTER — Other Ambulatory Visit: Payer: Self-pay | Admitting: Internal Medicine

## 2014-04-21 ENCOUNTER — Other Ambulatory Visit: Payer: Self-pay | Admitting: Internal Medicine

## 2014-05-04 DIAGNOSIS — H1030 Unspecified acute conjunctivitis, unspecified eye: Secondary | ICD-10-CM | POA: Diagnosis not present

## 2014-05-11 ENCOUNTER — Other Ambulatory Visit: Payer: Self-pay | Admitting: Internal Medicine

## 2014-05-19 ENCOUNTER — Other Ambulatory Visit: Payer: Self-pay | Admitting: Internal Medicine

## 2014-05-24 ENCOUNTER — Encounter: Payer: Self-pay | Admitting: Internal Medicine

## 2014-05-24 ENCOUNTER — Ambulatory Visit (INDEPENDENT_AMBULATORY_CARE_PROVIDER_SITE_OTHER): Payer: Medicare Other | Admitting: Internal Medicine

## 2014-05-24 VITALS — BP 130/62 | HR 75 | Temp 98.0°F | Ht 61.0 in | Wt 159.0 lb

## 2014-05-24 DIAGNOSIS — I6529 Occlusion and stenosis of unspecified carotid artery: Secondary | ICD-10-CM

## 2014-05-24 DIAGNOSIS — M150 Primary generalized (osteo)arthritis: Secondary | ICD-10-CM

## 2014-05-24 DIAGNOSIS — J449 Chronic obstructive pulmonary disease, unspecified: Secondary | ICD-10-CM

## 2014-05-24 DIAGNOSIS — I10 Essential (primary) hypertension: Secondary | ICD-10-CM

## 2014-05-24 DIAGNOSIS — J209 Acute bronchitis, unspecified: Secondary | ICD-10-CM

## 2014-05-24 DIAGNOSIS — J208 Acute bronchitis due to other specified organisms: Secondary | ICD-10-CM

## 2014-05-24 DIAGNOSIS — M159 Polyosteoarthritis, unspecified: Secondary | ICD-10-CM

## 2014-05-24 MED ORDER — LEVOFLOXACIN 500 MG PO TABS: 500.0000 mg | ORAL_TABLET | Freq: Every day | ORAL | Status: DC

## 2014-05-24 MED ORDER — TRAMADOL HCL 50 MG PO TABS: 50.0000 mg | ORAL_TABLET | Freq: Every day | ORAL | Status: DC | PRN

## 2014-05-24 MED ORDER — PREDNISONE 10 MG PO TABS: ORAL_TABLET | ORAL | Status: DC

## 2014-05-24 NOTE — Progress Notes (Signed)
Subjective:    Patient ID: Christina Mckinney, female    DOB: 12-11-1931, 80 y.o.   MRN: [id number]  HPI  80 year old white female with history of hypertension, COPD, and chronic osteoarthritic arthritic pain complains of worsening cough x 1 week.  Her symptoms started with sinus congestion then progressed into her chest. She now has productive cough off yellowish/green sputum. She denies any fever but reports mild dyspnea.  Chronic osteoarthritis-patient reports good response to tramadol 50 mg. She is avoiding NSAIDs as directed.  She is concerned that she is not urinating as she normally does.   Review of Systems Negative for fever or chills, productive cough, mild dyspnea    Past Medical History  Diagnosis Date  . Depressive disorder, not elsewhere classified   . Diverticulitis   . GERD (gastroesophageal reflux disease)   . Other and unspecified hyperlipidemia   . Unspecified essential hypertension   . Other abnormal glucose   . Unspecified hypothyroidism   . Personal history of colonic polyps   . Anxiety state, unspecified   . Personal history of peptic ulcer disease   . Osteoarthritis   . Gout, unspecified   . Essential and other specified forms of tremor   . Internal hemorrhoids without mention of complication   . Anemia, unspecified   . Unspecified disorder of bladder   . Pulmonary embolism   . DVT (deep venous thrombosis)   . Varicose veins   . Chest pain     History   Social History  . Marital Status: Widowed    Spouse Name: N/A    Number of Children: 3  . Years of Education: N/A   Occupational History  . Retired     Social History Main Topics  . Smoking status: Former Smoker    Types: Cigarettes    Quit date: 11/12/1988  . Smokeless tobacco: Never Used  . Alcohol Use: 2.0 oz/week    4 drink(s) per week     Comment: occasional wine   . Drug Use: No  . Sexual Activity: Not on file   Other Topics Concern  . Not on file   Social History Narrative  .  No narrative on file    Past Surgical History  Procedure Laterality Date  . Cholecystectomy    . Abdominal hysterectomy    . Nasal septum surgery    . Shoulder surgery    . Knee arthroscopy      right   . Eye surgery    . Endovenous ablation saphenous vein w/ laser  09-11-2012    left greater saphenous vein   by Curt Jews MD  . Total knee arthroplasty  09/2013    Right    Family History  Problem Relation Age of Onset  . Colon cancer Neg Hx   . Other Mother     varicose veins  . Heart disease Father   . Hyperlipidemia Father   . Heart attack Father   . Diabetes Daughter   . Heart disease Daughter   . Hyperlipidemia Daughter   . Hypertension Daughter     Allergies  Allergen Reactions  . Crestor [Rosuvastatin]     Left hip pain  . Lipitor [Atorvastatin]     Pain in hips  . Red Dye     Current Outpatient Prescriptions on File Prior to Visit  Medication Sig Dispense Refill  . ALPRAZolam (XANAX) 0.25 MG tablet TAKE 1 TABLET BY MOUTH TWICE A DAY AS NEEDED FOR ANXIETY  60  tablet  3  . aspirin 81 MG tablet Take 1 tablet (81 mg total) by mouth daily.  30 tablet    . azelastine (ASTELIN) 137 MCG/SPRAY nasal spray Place 1 spray into the nose daily as needed. Use in each nostril as directed      . Cholecalciferol (VITAMIN D) 2000 UNITS tablet Take 2,000 Units by mouth daily.        Marland Kitchen co-enzyme Q-10 30 MG capsule Take 30 mg by mouth daily.       . cycloSPORINE (RESTASIS) 0.05 % ophthalmic emulsion Place 1 drop into both eyes 2 (two) times daily.  0.4 mL  3  . diazepam (VALIUM) 2 MG tablet Take 1 tablet (2 mg total) by mouth every 8 (eight) hours as needed (vertigo).  60 tablet  3  . diclofenac sodium (VOLTAREN) 1 % GEL Apply 1 application topically as needed.      . fluticasone (FLONASE) 50 MCG/ACT nasal spray Place 2 sprays into the nose as needed. Allergies      . furosemide (LASIX) 40 MG tablet TAKE 1 TABLET DAILY AS DIRECTED  90 tablet  2  . gabapentin (NEURONTIN) 300 MG  capsule TAKE 1 CAPSULE DAILY  90 capsule  2  . Glucosamine-Chondroitin 500-250 MG CAPS Take 1 capsule by mouth as needed.       Marland Kitchen HYDROcodone-acetaminophen (NORCO/VICODIN) 5-325 MG per tablet Take 1 tablet by mouth every 6 (six) hours as needed.  90 tablet  0  . lansoprazole (PREVACID) 30 MG capsule TAKE 1 CAPSULE TWICE A DAY  180 capsule  0  . levothyroxine (SYNTHROID, LEVOTHROID) 50 MCG tablet TAKE 1 TABLET DAILY BEFORE BREAKFAST  90 tablet  0  . losartan (COZAAR) 50 MG tablet TAKE 1 TABLET DAILY  90 tablet  1  . methocarbamol (ROBAXIN) 500 MG tablet Take 1 tablet (500 mg total) by mouth 2 (two) times daily as needed (pain).  90 tablet  0  . metoprolol succinate (TOPROL-XL) 50 MG 24 hr tablet TAKE 1 TABLET DAILY. TAKE WITH OR IMMEDIATELY FOLLOWING A MEAL  90 tablet  1  . rOPINIRole (REQUIP) 1 MG tablet TAKE 1 TABLET AT BEDTIME  90 tablet  1  . traMADol (ULTRAM) 50 MG tablet Take 1 tablet (50 mg total) by mouth daily as needed for moderate pain.  30 tablet  2  . ULORIC 40 MG tablet TAKE 1 TABLET DAILY  90 tablet  3  . [DISCONTINUED] propranolol (INDERAL) 60 MG tablet Take 1 tablet (60 mg total) by mouth 2 (two) times daily.  60 tablet  3   No current facility-administered medications on file prior to visit.    BP 130/62  Pulse 75  Temp(Src) 98 F (36.7 C) (Oral)  Ht 5\' 1"  (1.549 m)  Wt 159 lb (72.122 kg)  BMI 30.06 kg/m2  SpO2 91%    Objective:   Physical Exam  Constitutional: She is oriented to person, place, and time. She appears well-developed and well-nourished. No distress.  HENT:  Head: Normocephalic and atraumatic.  Right Ear: External ear normal.  Left Ear: External ear normal.  Mouth/Throat: Oropharynx is clear and moist.  Neck: Neck supple.  No neck tenderness  Cardiovascular: Normal rate, regular rhythm and normal heart sounds.   No murmur heard. Pulmonary/Chest: Effort normal.  Diffuse expiratory wheeze  Musculoskeletal: She exhibits no edema.  Lymphadenopathy:      She has no cervical adenopathy.  Neurological: She is alert and oriented to person, place, and time. No  cranial nerve deficit.  Psychiatric: She has a normal mood and affect. Her behavior is normal.       Assessment & Plan:

## 2014-05-24 NOTE — Assessment & Plan Note (Signed)
80 year old white female presents with signs and symptoms of acute bronchitis. Treat with Levaquin 500 milligrams once daily for 10 days. Also take prednisone taper as directed.  Patient advised to call office if symptoms persist or worsen.

## 2014-05-24 NOTE — Progress Notes (Signed)
Pre visit review using our clinic review tool, if applicable. No additional management support is needed unless otherwise documented below in the visit note. 

## 2014-05-24 NOTE — Assessment & Plan Note (Signed)
Discuss restarting maintenance inhalers at next office visit.

## 2014-05-24 NOTE — Assessment & Plan Note (Signed)
Avoid NSAIDs as she experienced bump in her Cr in the recent past.  Continue tramadol.  Monitor BMET.

## 2014-05-24 NOTE — Patient Instructions (Signed)
Please contact our office if your symptoms do not improve or gets worse.  

## 2014-05-25 LAB — BASIC METABOLIC PANEL
BUN: 19 mg/dL (ref 6–23)
CHLORIDE: 99 meq/L (ref 96–112)
CO2: 28 meq/L (ref 19–32)
CREATININE: 1.3 mg/dL — AB (ref 0.4–1.2)
Calcium: 10.9 mg/dL — ABNORMAL HIGH (ref 8.4–10.5)
GFR: 42.99 mL/min — ABNORMAL LOW (ref >60.00)
GLUCOSE: 101 mg/dL — AB (ref 70–99)
Potassium: 5.1 mEq/L (ref 3.5–5.1)
Sodium: 139 mEq/L (ref 135–145)

## 2014-05-31 DIAGNOSIS — Z961 Presence of intraocular lens: Secondary | ICD-10-CM | POA: Diagnosis not present

## 2014-05-31 DIAGNOSIS — H04129 Dry eye syndrome of unspecified lacrimal gland: Secondary | ICD-10-CM | POA: Diagnosis not present

## 2014-06-01 DIAGNOSIS — I69998 Other sequelae following unspecified cerebrovascular disease: Secondary | ICD-10-CM | POA: Diagnosis not present

## 2014-06-01 DIAGNOSIS — R42 Dizziness and giddiness: Secondary | ICD-10-CM | POA: Diagnosis not present

## 2014-06-08 ENCOUNTER — Other Ambulatory Visit: Payer: Self-pay | Admitting: *Deleted

## 2014-06-08 DIAGNOSIS — I69998 Other sequelae following unspecified cerebrovascular disease: Secondary | ICD-10-CM | POA: Diagnosis not present

## 2014-06-08 MED ORDER — OMEPRAZOLE 20 MG PO CPDR: 20.0000 mg | DELAYED_RELEASE_CAPSULE | Freq: Every day | ORAL | Status: DC

## 2014-06-12 ENCOUNTER — Other Ambulatory Visit: Payer: Self-pay | Admitting: Internal Medicine

## 2014-06-12 ENCOUNTER — Encounter: Payer: Self-pay | Admitting: Family Medicine

## 2014-06-12 ENCOUNTER — Ambulatory Visit (INDEPENDENT_AMBULATORY_CARE_PROVIDER_SITE_OTHER): Payer: Medicare Other | Admitting: Family Medicine

## 2014-06-12 DIAGNOSIS — J209 Acute bronchitis, unspecified: Secondary | ICD-10-CM

## 2014-06-12 DIAGNOSIS — K5900 Constipation, unspecified: Secondary | ICD-10-CM

## 2014-06-12 DIAGNOSIS — R3911 Hesitancy of micturition: Secondary | ICD-10-CM

## 2014-06-12 DIAGNOSIS — E039 Hypothyroidism, unspecified: Secondary | ICD-10-CM

## 2014-06-12 DIAGNOSIS — R609 Edema, unspecified: Secondary | ICD-10-CM | POA: Diagnosis not present

## 2014-06-12 DIAGNOSIS — R319 Hematuria, unspecified: Secondary | ICD-10-CM | POA: Diagnosis not present

## 2014-06-12 DIAGNOSIS — J208 Acute bronchitis due to other specified organisms: Secondary | ICD-10-CM

## 2014-06-12 DIAGNOSIS — R350 Frequency of micturition: Secondary | ICD-10-CM

## 2014-06-12 DIAGNOSIS — I10 Essential (primary) hypertension: Secondary | ICD-10-CM

## 2014-06-12 DIAGNOSIS — I6529 Occlusion and stenosis of unspecified carotid artery: Secondary | ICD-10-CM | POA: Diagnosis not present

## 2014-06-12 HISTORY — DX: Edema, unspecified: R60.9

## 2014-06-12 LAB — POCT URINALYSIS DIPSTICK
Bilirubin, UA: NEGATIVE
Glucose, UA: NEGATIVE
Ketones, UA: NEGATIVE
Leukocytes, UA: NEGATIVE
Nitrite, UA: NEGATIVE
PROTEIN UA: NEGATIVE
SPEC GRAV UA: 1.01
Urobilinogen, UA: 0.2
pH, UA: 7.5

## 2014-06-12 MED ORDER — NITROFURANTOIN MONOHYD MACRO 100 MG PO CAPS: 100.0000 mg | ORAL_CAPSULE | Freq: Two times a day (BID) | ORAL | Status: DC

## 2014-06-12 NOTE — Patient Instructions (Signed)
When edema is up keep sodium below 1500 mg a day  Elevate feet above your heart at least twice a day with swelling Very infrequently you can take an extra dose of Lasix Hydrate well  Start a probiotic daily such as Digestive Advantage, Probiotic or Align or a generic Kegel Exercise   Edema Edema is an abnormal buildup of fluids in your bodytissues. Edema is somewhatdependent on gravity to pull the fluid to the lowest place in your body. That makes the condition more common in the legs and thighs (lower extremities). Painless swelling of the feet and ankles is common and becomes more likely as you get older. It is also common in looser tissues, like around your eyes.  When the affected area is squeezed, the fluid may move out of that spot and leave a dent for a few moments. This dent is called pitting.  CAUSES  There are many possible causes of edema. Eating too much salt and being on your feet or sitting for a long time can cause edema in your legs and ankles. Hot weather may make edema worse. Common medical causes of edema include:  Heart failure.  Liver disease.  Kidney disease.  Weak blood vessels in your legs.  Cancer.  An injury.  Pregnancy.  Some medications.  Obesity. SYMPTOMS  Edema is usually painless.Your skin may look swollen or shiny.  DIAGNOSIS  Your health care provider may be able to diagnose edema by asking about your medical history and doing a physical exam. You may need to have tests such as X-rays, an electrocardiogram, or blood tests to check for medical conditions that may cause edema.  TREATMENT  Edema treatment depends on the cause. If you have heart, liver, or kidney disease, you need the treatment appropriate for these conditions. General treatment may include:  Elevation of the affected body part above the level of your heart.  Compression of the affected body part. Pressure from elastic bandages or support stockings squeezes the tissues and  forces fluid back into the blood vessels. This keeps fluid from entering the tissues.  Restriction of fluid and salt intake.  Use of a water pill (diuretic). These medications are appropriate only for some types of edema. They pull fluid out of your body and make you urinate more often. This gets rid of fluid and reduces swelling, but diuretics can have side effects. Only use diuretics as directed by your health care provider. HOME CARE INSTRUCTIONS   Keep the affected body part above the level of your heart when you are lying down.   Do not sit still or stand for prolonged periods.   Do not put anything directly under your knees when lying down.  Do not wear constricting clothing or garters on your upper legs.   Exercise your legs to work the fluid back into your blood vessels. This may help the swelling go down.   Wear elastic bandages or support stockings to reduce ankle swelling as directed by your health care provider.   Eat a low-salt diet to reduce fluid if your health care provider recommends it.   Only take medicines as directed by your health care provider. SEEK MEDICAL CARE IF:   Your edema is not responding to treatment.  You have heart, liver, or kidney disease and notice symptoms of edema.  You have edema in your legs that does not improve after elevating them.   You have sudden and unexplained weight gain. SEEK IMMEDIATE MEDICAL CARE IF:  You develop shortness of breath or chest pain.   You cannot breathe when you lie down.  You develop pain, redness, or warmth in the swollen areas.   You have heart, liver, or kidney disease and suddenly get edema.  You have a fever and your symptoms suddenly get worse. MAKE SURE YOU:   Understand these instructions.  Will watch your condition.  Will get help right away if you are not doing well or get worse. Document Released: 10/29/2005 Document Revised: 03/15/2014 Document Reviewed: 08/21/2013 [facility]  Patient Information 2015 [place], [place]. This information is not intended to replace advice given to you by your health care provider. Make sure you discuss any questions you have with your health care provider.

## 2014-06-12 NOTE — Assessment & Plan Note (Signed)
Well controlled, no changes to meds. Encouraged heart healthy diet such as the DASH diet and exercise as tolerated.  °

## 2014-06-12 NOTE — Progress Notes (Signed)
Patient ID: Christina Mckinney, female   DOB: 1932-02-02, 80 y.o.   MRN: [id number] IO DIEUJUSTE [id number] [birth year] 06/12/2014      Progress Note-Follow Up  Subjective  Chief Complaint  Chief Complaint  Patient presents with  . Edema    HPI  Patient is a 80 year old female in today for routine medical care. Recovering from bronchitis after being treated with steroids and antibiotics, is feeling better. Does still have some congestion and and. No f/c/ha. Is noting some urinary hesitancy but denies any dysuria, frequency etc. Denies CP/palp/SOB/HA/congestion/fevers/GI c/o. Taking meds as prescribed  Past Medical History  Diagnosis Date  . Depressive disorder, not elsewhere classified   . Diverticulitis   . GERD (gastroesophageal reflux disease)   . Other and unspecified hyperlipidemia   . Unspecified essential hypertension   . Other abnormal glucose   . Unspecified hypothyroidism   . Personal history of colonic polyps   . Anxiety state, unspecified   . Personal history of peptic ulcer disease   . Osteoarthritis   . Gout, unspecified   . Essential and other specified forms of tremor   . Internal hemorrhoids without mention of complication   . Anemia, unspecified   . Unspecified disorder of bladder   . Pulmonary embolism   . DVT (deep venous thrombosis)   . Varicose veins   . Chest pain   . Edema 06/12/2014    Past Surgical History  Procedure Laterality Date  . Cholecystectomy    . Abdominal hysterectomy    . Nasal septum surgery    . Shoulder surgery    . Knee arthroscopy      right   . Eye surgery    . Endovenous ablation saphenous vein w/ laser  09-11-2012    left greater saphenous vein   by Curt Jews MD  . Total knee arthroplasty  09/2013    Right    Family History  Problem Relation Age of Onset  . Colon cancer Neg Hx   . Other Mother     varicose veins  . Heart disease Father   . Hyperlipidemia Father   . Heart attack Father   . Diabetes Daughter   .  Heart disease Daughter   . Hyperlipidemia Daughter   . Hypertension Daughter     History   Social History  . Marital Status: Widowed    Spouse Name: N/A    Number of Children: 3  . Years of Education: N/A   Occupational History  . Retired     Social History Main Topics  . Smoking status: Former Smoker    Types: Cigarettes    Quit date: 11/12/1988  . Smokeless tobacco: Never Used  . Alcohol Use: 2.0 oz/week    4 drink(s) per week     Comment: occasional wine   . Drug Use: No  . Sexual Activity: Not on file   Other Topics Concern  . Not on file   Social History Narrative  . No narrative on file    Current Outpatient Prescriptions on File Prior to Visit  Medication Sig Dispense Refill  . ALPRAZolam (XANAX) 0.25 MG tablet TAKE 1 TABLET BY MOUTH TWICE A DAY AS NEEDED FOR ANXIETY  60 tablet  3  . aspirin 81 MG tablet Take 1 tablet (81 mg total) by mouth daily.  30 tablet    . azelastine (ASTELIN) 137 MCG/SPRAY nasal spray Place 1 spray into the nose daily as needed. Use in each nostril as  directed      . Cholecalciferol (VITAMIN D) 2000 UNITS tablet Take 2,000 Units by mouth daily.        Marland Kitchen co-enzyme Q-10 30 MG capsule Take 30 mg by mouth daily.       . cycloSPORINE (RESTASIS) 0.05 % ophthalmic emulsion Place 1 drop into both eyes 2 (two) times daily.  0.4 mL  3  . diazepam (VALIUM) 2 MG tablet Take 1 tablet (2 mg total) by mouth every 8 (eight) hours as needed (vertigo).  60 tablet  3  . diclofenac sodium (VOLTAREN) 1 % GEL Apply 1 application topically as needed.      . fluticasone (FLONASE) 50 MCG/ACT nasal spray Place 2 sprays into the nose as needed. Allergies      . furosemide (LASIX) 40 MG tablet TAKE 1 TABLET DAILY AS DIRECTED  90 tablet  2  . Glucosamine-Chondroitin 500-250 MG CAPS Take 1 capsule by mouth as needed.       . lansoprazole (PREVACID) 30 MG capsule TAKE 1 CAPSULE TWICE A DAY  180 capsule  0  . levothyroxine (SYNTHROID, LEVOTHROID) 50 MCG tablet TAKE 1  TABLET DAILY BEFORE BREAKFAST  90 tablet  0  . losartan (COZAAR) 50 MG tablet TAKE 1 TABLET DAILY  90 tablet  1  . methocarbamol (ROBAXIN) 500 MG tablet Take 1 tablet (500 mg total) by mouth 2 (two) times daily as needed (pain).  90 tablet  0  . metoprolol succinate (TOPROL-XL) 50 MG 24 hr tablet TAKE 1 TABLET DAILY. TAKE WITH OR IMMEDIATELY FOLLOWING A MEAL  90 tablet  1  . omeprazole (PRILOSEC) 20 MG capsule Take 1 capsule (20 mg total) by mouth daily.  90 capsule  3  . predniSONE (DELTASONE) 10 MG tablet 4 tablets once daily for 4 days, 3 tablets once daily for 4 days, 2 tablets once daily for 4 days, then 1 tablet for 4 days  40 tablet  0  . rOPINIRole (REQUIP) 1 MG tablet TAKE 1 TABLET AT BEDTIME  90 tablet  1  . ULORIC 40 MG tablet TAKE 1 TABLET DAILY  90 tablet  3  . gabapentin (NEURONTIN) 300 MG capsule TAKE 1 CAPSULE DAILY  90 capsule  2  . HYDROcodone-acetaminophen (NORCO/VICODIN) 5-325 MG per tablet Take 1 tablet by mouth every 6 (six) hours as needed.  90 tablet  0  . levofloxacin (LEVAQUIN) 500 MG tablet Take 1 tablet (500 mg total) by mouth daily.  10 tablet  0  . traMADol (ULTRAM) 50 MG tablet Take 1 tablet (50 mg total) by mouth daily as needed for moderate pain.  90 tablet  1  . [DISCONTINUED] propranolol (INDERAL) 60 MG tablet Take 1 tablet (60 mg total) by mouth 2 (two) times daily.  60 tablet  3   No current facility-administered medications on file prior to visit.    Allergies  Allergen Reactions  . Crestor [Rosuvastatin]     Left hip pain  . Lipitor [Atorvastatin]     Pain in hips  . Red Dye     Review of Systems  Review of Systems  Constitutional: Negative for fever and malaise/fatigue.  HENT: Positive for congestion.   Eyes: Negative for discharge.  Respiratory: Positive for cough and shortness of breath.   Cardiovascular: Positive for leg swelling. Negative for chest pain and palpitations.  Gastrointestinal: Negative for nausea, abdominal pain and diarrhea.   Genitourinary: Negative for dysuria.  Musculoskeletal: Negative for falls.  Skin: Negative for rash.  Neurological: Negative for loss of consciousness and headaches.  Endo/Heme/Allergies: Negative for polydipsia.  Psychiatric/Behavioral: Negative for depression and suicidal ideas. The patient is not nervous/anxious and does not have insomnia.     Objective  There were no vitals taken for this visit.  Physical Exam  Physical Exam  Constitutional: She is oriented to person, place, and time and well-developed, well-nourished, and in no distress. No distress.  HENT:  Head: Normocephalic and atraumatic.  Eyes: Conjunctivae are normal.  Neck: Neck supple. No thyromegaly present.  Cardiovascular: Normal rate, regular rhythm and normal heart sounds.   No murmur heard. Pulmonary/Chest: Effort normal and breath sounds normal. She has no wheezes.  Abdominal: She exhibits no distension and no mass.  Musculoskeletal: She exhibits edema.  Lymphadenopathy:    She has no cervical adenopathy.  Neurological: She is alert and oriented to person, place, and time.  Skin: Skin is warm and dry. No rash noted. She is not diaphoretic.  Psychiatric: Memory, affect and judgment normal.    Lab Results  Component Value Date   TSH 1.96 02/22/2014   Lab Results  Component Value Date   WBC 7.6 11/23/2013   HGB 12.1 11/23/2013   HCT 36.1 11/23/2013   MCV 89.0 11/23/2013   PLT 263.0 11/23/2013   Lab Results  Component Value Date   CREATININE 1.3* 05/24/2014   BUN 19 05/24/2014   NA 139 05/24/2014   K 5.1 05/24/2014   CL 99 05/24/2014   CO2 28 05/24/2014   Lab Results  Component Value Date   ALT 10 08/25/2013   AST 26 08/25/2013   ALKPHOS 74 08/25/2013   BILITOT 0.8 08/25/2013   Lab Results  Component Value Date   CHOL 190 11/03/2012   Lab Results  Component Value Date   HDL 49.80 11/03/2012   Lab Results  Component Value Date   LDLCALC 114* 11/03/2012   Lab Results  Component Value Date    TRIG 133.0 11/03/2012   Lab Results  Component Value Date   CHOLHDL 4 11/03/2012     Assessment & Plan  HYPERTENSION Well controlled, no changes to meds. Encouraged heart healthy diet such as the DASH diet and exercise as tolerated.   Acute bronchitis Improving with treatment with steroids and antibiotics, no concerns today  HYPOTHYROIDISM On Levothyroxine, continue to monitor  Constipation Encouraged increased hydration and fiber in diet. Daily probiotics. If bowels not moving can use MOM 2 tbls po in 4 oz of warm prune juice by mouth every 2-3 days. If no results then repeat in 4 hours with  Dulcolax suppository pr, may repeat again in 4 more hours as needed. Seek care if symptoms worsen. Consider daily Miralax and/or Dulcolax if symptoms persist.   Urinary frequency Given trial of macrobid  Edema Encouraged to elevate feet above heart, minimize sodium and try compression hose

## 2014-06-14 ENCOUNTER — Telehealth: Payer: Self-pay | Admitting: Internal Medicine

## 2014-06-14 DIAGNOSIS — R3911 Hesitancy of micturition: Secondary | ICD-10-CM

## 2014-06-14 LAB — URINE CULTURE

## 2014-06-14 MED ORDER — NITROFURANTOIN MONOHYD MACRO 100 MG PO CAPS: 100.0000 mg | ORAL_CAPSULE | Freq: Two times a day (BID) | ORAL | Status: DC

## 2014-06-14 NOTE — Telephone Encounter (Signed)
Dr Charlett Blake saw patient on Saturday  [facility]  [facility] on [address].

## 2014-06-14 NOTE — Assessment & Plan Note (Signed)
Improving with treatment with steroids and antibiotics, no concerns today

## 2014-06-14 NOTE — Assessment & Plan Note (Signed)
Given trial of macrobid

## 2014-06-14 NOTE — Assessment & Plan Note (Signed)
On Levothyroxine, continue to monitor 

## 2014-06-14 NOTE — Telephone Encounter (Signed)
[facility] [see at [facility]] pt request to local [facility]; Rx sent & pt masde aware by [facility] [[facility]

## 2014-06-14 NOTE — Assessment & Plan Note (Signed)
Encouraged increased hydration and fiber in diet. Daily probiotics. If bowels not moving can use MOM 2 tbls po in 4 oz of warm prune juice by mouth every 2-3 days. If no results then repeat in 4 hours with  Dulcolax suppository pr, may repeat again in 4 more hours as needed. Seek care if symptoms worsen. Consider daily Miralax and/or Dulcolax if symptoms persist.  

## 2014-06-14 NOTE — Assessment & Plan Note (Signed)
Encouraged to elevate feet above heart, minimize sodium and try compression hose

## 2014-07-01 DIAGNOSIS — R42 Dizziness and giddiness: Secondary | ICD-10-CM | POA: Diagnosis not present

## 2014-07-01 DIAGNOSIS — I69998 Other sequelae following unspecified cerebrovascular disease: Secondary | ICD-10-CM | POA: Diagnosis not present

## 2014-07-05 DIAGNOSIS — I69998 Other sequelae following unspecified cerebrovascular disease: Secondary | ICD-10-CM | POA: Diagnosis not present

## 2014-07-05 DIAGNOSIS — R42 Dizziness and giddiness: Secondary | ICD-10-CM | POA: Diagnosis not present

## 2014-07-12 ENCOUNTER — Encounter: Payer: Self-pay | Admitting: Internal Medicine

## 2014-07-12 ENCOUNTER — Ambulatory Visit (INDEPENDENT_AMBULATORY_CARE_PROVIDER_SITE_OTHER): Payer: Medicare Other | Admitting: Internal Medicine

## 2014-07-12 VITALS — BP 158/72 | HR 83 | Temp 97.9°F | Ht 61.0 in

## 2014-07-12 DIAGNOSIS — I10 Essential (primary) hypertension: Secondary | ICD-10-CM | POA: Diagnosis not present

## 2014-07-12 DIAGNOSIS — Z23 Encounter for immunization: Secondary | ICD-10-CM | POA: Diagnosis not present

## 2014-07-12 DIAGNOSIS — I6529 Occlusion and stenosis of unspecified carotid artery: Secondary | ICD-10-CM

## 2014-07-12 DIAGNOSIS — G589 Mononeuropathy, unspecified: Secondary | ICD-10-CM

## 2014-07-12 DIAGNOSIS — M25552 Pain in left hip: Secondary | ICD-10-CM

## 2014-07-12 DIAGNOSIS — M25559 Pain in unspecified hip: Secondary | ICD-10-CM | POA: Diagnosis not present

## 2014-07-12 MED ORDER — GABAPENTIN 300 MG PO CAPS: 300.0000 mg | ORAL_CAPSULE | Freq: Two times a day (BID) | ORAL | Status: DC

## 2014-07-12 MED ORDER — HYDROCODONE-ACETAMINOPHEN 5-325 MG PO TABS: 1.0000 | ORAL_TABLET | Freq: Two times a day (BID) | ORAL | Status: DC | PRN

## 2014-07-12 NOTE — Progress Notes (Signed)
Pre visit review using our clinic review tool, if applicable. No additional management support is needed unless otherwise documented below in the visit note. 

## 2014-07-12 NOTE — Patient Instructions (Signed)
Monitor your blood pressure at home as directed with automated blood pressure cuff. Bring your log or automated cuff to your next follow up appointment

## 2014-07-12 NOTE — Assessment & Plan Note (Signed)
Her leg pains getting worse. Increase gabapentin to 300 mg twice daily.

## 2014-07-12 NOTE — Assessment & Plan Note (Signed)
She has left hip and left knee pain.  She has upcoming appt with orthopedic specialist.  Patient advised to use vicodin sparingly until her evaluation.

## 2014-07-12 NOTE — Progress Notes (Signed)
Subjective:    Patient ID: Christina Mckinney, female    DOB: [birth year], 80 y.o.   MRN: [id number]  HPI  80 year old white female with history of hypertension, hypothyroidism and history of pulmonary embolism for routine followup. Interval medical history-patient was seen by Dr. Charlett Blake 06/12/2014.  Patient reports she was treated for a urinary tract infection.  She also experienced improvement with prednisone taper for resolving bronchitis.  Hypertension - good medication compliance.  Increased life stressors.  Her brother and nephew passed away recently.  Review of Systems    left hip and left knee pain.  She is scheduled to followup with her orthopedic physician in [place]. Negative for chest pain Her lower leg neuropathic pain getting worse at times  Past Medical History  Diagnosis Date  . Depressive disorder, not elsewhere classified   . Diverticulitis   . GERD (gastroesophageal reflux disease)   . Other and unspecified hyperlipidemia   . Unspecified essential hypertension   . Other abnormal glucose   . Unspecified hypothyroidism   . Personal history of colonic polyps   . Anxiety state, unspecified   . Personal history of peptic ulcer disease   . Osteoarthritis   . Gout, unspecified   . Essential and other specified forms of tremor   . Internal hemorrhoids without mention of complication   . Anemia, unspecified   . Unspecified disorder of bladder   . Pulmonary embolism   . DVT (deep venous thrombosis)   . Varicose veins   . Chest pain   . Edema 06/12/2014    History   Social History  . Marital Status: Widowed    Spouse Name: N/A    Number of Children: 3  . Years of Education: N/A   Occupational History  . Retired     Social History Main Topics  . Smoking status: Former Smoker    Types: Cigarettes    Quit date: 11/12/1988  . Smokeless tobacco: Never Used  . Alcohol Use: 2.0 oz/week    4 drink(s) per week     Comment: occasional wine   . Drug Use: No  .  Sexual Activity: Not on file   Other Topics Concern  . Not on file   Social History Narrative  . No narrative on file    Past Surgical History  Procedure Laterality Date  . Cholecystectomy    . Abdominal hysterectomy    . Nasal septum surgery    . Shoulder surgery    . Knee arthroscopy      right   . Eye surgery    . Endovenous ablation saphenous vein w/ laser  09-11-2012    left greater saphenous vein   by Curt Jews MD  . Total knee arthroplasty  09/2013    Right    Family History  Problem Relation Age of Onset  . Colon cancer Neg Hx   . Other Mother     varicose veins  . Heart disease Father   . Hyperlipidemia Father   . Heart attack Father   . Diabetes Daughter   . Heart disease Daughter   . Hyperlipidemia Daughter   . Hypertension Daughter     Allergies  Allergen Reactions  . Crestor [Rosuvastatin]     Left hip pain  . Lipitor [Atorvastatin]     Pain in hips  . Red Dye     Current Outpatient Prescriptions on File Prior to Visit  Medication Sig Dispense Refill  . ALPRAZolam (XANAX) 0.25 MG tablet  TAKE 1 TABLET BY MOUTH TWICE A DAY AS NEEDED FOR ANXIETY  60 tablet  3  . aspirin 81 MG tablet Take 1 tablet (81 mg total) by mouth daily.  30 tablet    . azelastine (ASTELIN) 137 MCG/SPRAY nasal spray Place 1 spray into the nose daily as needed. Use in each nostril as directed      . Cholecalciferol (VITAMIN D) 2000 UNITS tablet Take 2,000 Units by mouth daily.        Marland Kitchen co-enzyme Q-10 30 MG capsule Take 30 mg by mouth daily.       . cycloSPORINE (RESTASIS) 0.05 % ophthalmic emulsion Place 1 drop into both eyes 2 (two) times daily.  0.4 mL  3  . diazepam (VALIUM) 2 MG tablet Take 1 tablet (2 mg total) by mouth every 8 (eight) hours as needed (vertigo).  60 tablet  3  . diclofenac sodium (VOLTAREN) 1 % GEL Apply 1 application topically as needed.      . fluticasone (FLONASE) 50 MCG/ACT nasal spray Place 2 sprays into the nose as needed. Allergies      . furosemide  (LASIX) 40 MG tablet TAKE 1 TABLET DAILY AS DIRECTED  90 tablet  2  . Glucosamine-Chondroitin 500-250 MG CAPS Take 1 capsule by mouth as needed.       . lansoprazole (PREVACID) 30 MG capsule TAKE 1 CAPSULE TWICE A DAY  180 capsule  0  . levothyroxine (SYNTHROID, LEVOTHROID) 50 MCG tablet TAKE 1 TABLET DAILY BEFORE BREAKFAST  90 tablet  0  . losartan (COZAAR) 50 MG tablet TAKE 1 TABLET DAILY  90 tablet  1  . metoprolol succinate (TOPROL-XL) 50 MG 24 hr tablet TAKE 1 TABLET DAILY. TAKE WITH OR IMMEDIATELY FOLLOWING A MEAL  90 tablet  1  . nitrofurantoin, macrocrystal-monohydrate, (MACROBID) 100 MG capsule Take 1 capsule (100 mg total) by mouth 2 (two) times daily.  10 capsule  0  . omeprazole (PRILOSEC) 20 MG capsule Take 1 capsule (20 mg total) by mouth daily.  90 capsule  3  . rOPINIRole (REQUIP) 1 MG tablet TAKE 1 TABLET AT BEDTIME  90 tablet  1  . traMADol (ULTRAM) 50 MG tablet Take 1 tablet (50 mg total) by mouth daily as needed for moderate pain.  90 tablet  1  . ULORIC 40 MG tablet TAKE 1 TABLET DAILY  90 tablet  3  . [DISCONTINUED] propranolol (INDERAL) 60 MG tablet Take 1 tablet (60 mg total) by mouth 2 (two) times daily.  60 tablet  3   No current facility-administered medications on file prior to visit.    BP 158/72  Pulse 83  Temp(Src) 97.9 F (36.6 C) (Oral)  Ht 5\' 1"  (1.549 m)  SpO2 95%    Objective:   Physical Exam  Constitutional: She is oriented to person, place, and time. She appears well-developed and well-nourished. No distress.  Cardiovascular: Normal rate, regular rhythm and normal heart sounds.   Pulmonary/Chest: Effort normal and breath sounds normal. She has no wheezes.  Musculoskeletal: She exhibits no edema.  Neurological: She is alert and oriented to person, place, and time. No cranial nerve deficit.  Psychiatric: She has a normal mood and affect. Her behavior is normal.          Assessment & Plan:

## 2014-07-12 NOTE — Assessment & Plan Note (Signed)
Patient's blood pressure sporadically elevated today. Patient unsure whether she took her antihypertensives today. She was advised to monitor blood pressure at home. We will review her blood pressure log and make a decision about medication adjustments at next office visit. BP: 158/72 mmHg

## 2014-07-22 DIAGNOSIS — M1710 Unilateral primary osteoarthritis, unspecified knee: Secondary | ICD-10-CM | POA: Diagnosis not present

## 2014-07-28 DIAGNOSIS — R42 Dizziness and giddiness: Secondary | ICD-10-CM | POA: Diagnosis not present

## 2014-07-28 DIAGNOSIS — I69998 Other sequelae following unspecified cerebrovascular disease: Secondary | ICD-10-CM | POA: Diagnosis not present

## 2014-07-29 DIAGNOSIS — M76899 Other specified enthesopathies of unspecified lower limb, excluding foot: Secondary | ICD-10-CM | POA: Diagnosis not present

## 2014-07-29 DIAGNOSIS — M1710 Unilateral primary osteoarthritis, unspecified knee: Secondary | ICD-10-CM | POA: Diagnosis not present

## 2014-07-30 ENCOUNTER — Other Ambulatory Visit: Payer: Self-pay | Admitting: Internal Medicine

## 2014-08-05 DIAGNOSIS — M1710 Unilateral primary osteoarthritis, unspecified knee: Secondary | ICD-10-CM | POA: Diagnosis not present

## 2014-08-06 ENCOUNTER — Telehealth: Payer: Self-pay | Admitting: Internal Medicine

## 2014-08-06 NOTE — Telephone Encounter (Signed)
Pt needs re-fill on HYDROcodone-acetaminophen (NORCO/VICODIN) 5-325 MG per tablet ° °

## 2014-08-09 ENCOUNTER — Ambulatory Visit: Payer: Medicare Other | Admitting: Internal Medicine

## 2014-08-09 NOTE — Telephone Encounter (Signed)
RF x 1

## 2014-08-10 ENCOUNTER — Encounter: Payer: Self-pay | Admitting: Gastroenterology

## 2014-08-10 NOTE — Telephone Encounter (Signed)
ok 

## 2014-08-10 NOTE — Telephone Encounter (Signed)
Can you sign for this one to?

## 2014-08-11 MED ORDER — HYDROCODONE-ACETAMINOPHEN 5-325 MG PO TABS: 1.0000 | ORAL_TABLET | Freq: Two times a day (BID) | ORAL | Status: DC | PRN

## 2014-08-11 NOTE — Telephone Encounter (Signed)
rx up front for p/u, unable to leave message on home, left message on cell phone to p/u

## 2014-08-14 ENCOUNTER — Other Ambulatory Visit: Payer: Self-pay | Admitting: Internal Medicine

## 2014-08-31 ENCOUNTER — Other Ambulatory Visit: Payer: Self-pay | Admitting: *Deleted

## 2014-08-31 ENCOUNTER — Ambulatory Visit: Payer: Medicare Other | Admitting: Internal Medicine

## 2014-08-31 MED ORDER — FLUTICASONE PROPIONATE HFA 110 MCG/ACT IN AERO: 2.0000 | INHALATION_SPRAY | Freq: Two times a day (BID) | RESPIRATORY_TRACT | Status: DC

## 2014-09-01 ENCOUNTER — Ambulatory Visit (INDEPENDENT_AMBULATORY_CARE_PROVIDER_SITE_OTHER): Payer: Medicare Other | Admitting: Internal Medicine

## 2014-09-01 ENCOUNTER — Encounter: Payer: Self-pay | Admitting: Internal Medicine

## 2014-09-01 VITALS — BP 140/66 | HR 72 | Temp 98.7°F | Ht 61.0 in | Wt 157.0 lb

## 2014-09-01 DIAGNOSIS — I6529 Occlusion and stenosis of unspecified carotid artery: Secondary | ICD-10-CM | POA: Diagnosis not present

## 2014-09-01 DIAGNOSIS — E038 Other specified hypothyroidism: Secondary | ICD-10-CM | POA: Diagnosis not present

## 2014-09-01 DIAGNOSIS — F4323 Adjustment disorder with mixed anxiety and depressed mood: Secondary | ICD-10-CM

## 2014-09-01 DIAGNOSIS — Z23 Encounter for immunization: Secondary | ICD-10-CM

## 2014-09-01 DIAGNOSIS — E034 Atrophy of thyroid (acquired): Secondary | ICD-10-CM

## 2014-09-01 DIAGNOSIS — I10 Essential (primary) hypertension: Secondary | ICD-10-CM | POA: Diagnosis not present

## 2014-09-01 DIAGNOSIS — G4700 Insomnia, unspecified: Secondary | ICD-10-CM

## 2014-09-01 DIAGNOSIS — F5104 Psychophysiologic insomnia: Secondary | ICD-10-CM

## 2014-09-01 MED ORDER — TRIAZOLAM 0.125 MG PO TABS: 0.1250 mg | ORAL_TABLET | Freq: Every evening | ORAL | Status: DC | PRN

## 2014-09-01 MED ORDER — LANSOPRAZOLE 30 MG PO CPDR: 30.0000 mg | DELAYED_RELEASE_CAPSULE | Freq: Two times a day (BID) | ORAL | Status: DC

## 2014-09-01 NOTE — Assessment & Plan Note (Signed)
Stable.  Home BP readings within normal range.  No change in medication regimen. BP: 140/66 mmHg

## 2014-09-01 NOTE — Assessment & Plan Note (Signed)
Patient discontinued clonazepam.  She has trouble getting to sleep.  Trial of low dose triazolam.

## 2014-09-01 NOTE — Assessment & Plan Note (Signed)
Patient declines referral for counseling.  She could not tolerate citalopram in the past.

## 2014-09-01 NOTE — Progress Notes (Signed)
Subjective:    Patient ID: Christina Mckinney, female    DOB: July 06, 1932, 80 y.o.   MRN: [id number]  HPI  80 year old white female with history of hypertension, osteoarthritis and adjustment disorder for routine followup. Patient has been monitoring her blood pressure at home. Patient's systolic blood pressure readings between 120 and 130 at home. Her diastolic blood pressure in the high 70s.  Patient "broke up" with her significant other several months ago. Patient reports having difficulty sleeping.  She is having a tough time getting "him off her mind".  Hypothyroidism - she is due for follow up TFTs Review of Systems Negative for chest pain, negative for dizziness.    Past Medical History  Diagnosis Date  . Depressive disorder, not elsewhere classified   . Diverticulitis   . GERD (gastroesophageal reflux disease)   . Other and unspecified hyperlipidemia   . Unspecified essential hypertension   . Other abnormal glucose   . Unspecified hypothyroidism   . Personal history of colonic polyps   . Anxiety state, unspecified   . Personal history of peptic ulcer disease   . Osteoarthritis   . Gout, unspecified   . Essential and other specified forms of tremor   . Internal hemorrhoids without mention of complication   . Anemia, unspecified   . Unspecified disorder of bladder   . Pulmonary embolism   . DVT (deep venous thrombosis)   . Varicose veins   . Chest pain   . Edema 06/12/2014    History   Social History  . Marital Status: Widowed    Spouse Name: N/A    Number of Children: 3  . Years of Education: N/A   Occupational History  . Retired     Social History Main Topics  . Smoking status: Former Smoker    Types: Cigarettes    Quit date: 11/12/1988  . Smokeless tobacco: Never Used  . Alcohol Use: 2.0 oz/week    4 drink(s) per week     Comment: occasional wine   . Drug Use: No  . Sexual Activity: Not on file   Other Topics Concern  . Not on file   Social History  Narrative  . No narrative on file    Past Surgical History  Procedure Laterality Date  . Cholecystectomy    . Abdominal hysterectomy    . Nasal septum surgery    . Shoulder surgery    . Knee arthroscopy      right   . Eye surgery    . Endovenous ablation saphenous vein w/ laser  09-11-2012    left greater saphenous vein   by Curt Jews MD  . Total knee arthroplasty  09/2013    Right    Family History  Problem Relation Age of Onset  . Colon cancer Neg Hx   . Other Mother     varicose veins  . Heart disease Father   . Hyperlipidemia Father   . Heart attack Father   . Diabetes Daughter   . Heart disease Daughter   . Hyperlipidemia Daughter   . Hypertension Daughter     Allergies  Allergen Reactions  . Crestor [Rosuvastatin]     Left hip pain  . Lipitor [Atorvastatin]     Pain in hips  . Red Dye     Current Outpatient Prescriptions on File Prior to Visit  Medication Sig Dispense Refill  . ALPRAZolam (XANAX) 0.25 MG tablet TAKE 1 TABLET BY MOUTH TWICE A DAY AS NEEDED FOR  ANXIETY  60 tablet  3  . aspirin 81 MG tablet Take 1 tablet (81 mg total) by mouth daily.  30 tablet    . azelastine (ASTELIN) 137 MCG/SPRAY nasal spray Place 1 spray into the nose daily as needed. Use in each nostril as directed      . Cholecalciferol (VITAMIN D) 2000 UNITS tablet Take 2,000 Units by mouth daily.        Marland Kitchen co-enzyme Q-10 30 MG capsule Take 30 mg by mouth daily.       . cycloSPORINE (RESTASIS) 0.05 % ophthalmic emulsion Place 1 drop into both eyes 2 (two) times daily.  0.4 mL  3  . diclofenac sodium (VOLTAREN) 1 % GEL Apply 1 application topically as needed.      . fluticasone (FLONASE) 50 MCG/ACT nasal spray Place 2 sprays into the nose as needed. Allergies      . fluticasone (FLOVENT HFA) 110 MCG/ACT inhaler Inhale 2 puffs into the lungs 2 (two) times daily.  3 Inhaler  3  . furosemide (LASIX) 40 MG tablet TAKE 1 TABLET DAILY AS DIRECTED  90 tablet  2  . gabapentin (NEURONTIN) 300 MG  capsule Take 1 capsule (300 mg total) by mouth 2 (two) times daily.  60 capsule  3  . Glucosamine-Chondroitin 500-250 MG CAPS Take 1 capsule by mouth as needed.       Marland Kitchen HYDROcodone-acetaminophen (NORCO/VICODIN) 5-325 MG per tablet Take 1 tablet by mouth every 12 (twelve) hours as needed.  60 tablet  0  . levothyroxine (SYNTHROID, LEVOTHROID) 50 MCG tablet TAKE 1 TABLET DAILY BEFORE BREAKFAST  90 tablet  1  . losartan (COZAAR) 50 MG tablet TAKE 1 TABLET DAILY  90 tablet  1  . methocarbamol (ROBAXIN) 500 MG tablet TAKE 1 TABLET TWICE A DAY AS NEEDED FOR PAIN  90 tablet  1  . metoprolol succinate (TOPROL-XL) 50 MG 24 hr tablet TAKE 1 TABLET DAILY. TAKE WITH OR IMMEDIATELY FOLLOWING A MEAL  90 tablet  1  . omeprazole (PRILOSEC) 20 MG capsule Take 1 capsule (20 mg total) by mouth daily.  90 capsule  3  . rOPINIRole (REQUIP) 1 MG tablet TAKE 1 TABLET AT BEDTIME  90 tablet  1  . ULORIC 40 MG tablet TAKE 1 TABLET DAILY  90 tablet  3  . [DISCONTINUED] propranolol (INDERAL) 60 MG tablet Take 1 tablet (60 mg total) by mouth 2 (two) times daily.  60 tablet  3   No current facility-administered medications on file prior to visit.    BP 140/66  Pulse 72  Temp(Src) 98.7 F (37.1 C) (Oral)  Ht 5\' 1"  (1.549 m)  Wt 157 lb (71.215 kg)  BMI 29.68 kg/m2    Objective:   Physical Exam  Constitutional: She is oriented to person, place, and time. She appears well-developed and well-nourished.  HENT:  Head: Normocephalic and atraumatic.  Neck: Neck supple.  No carotid bruit  Cardiovascular: Normal rate, regular rhythm and normal heart sounds.   No murmur heard. Pulmonary/Chest: Effort normal and breath sounds normal. She has no wheezes.  Neurological: She is alert and oriented to person, place, and time. No cranial nerve deficit.  Psychiatric: She has a normal mood and affect. Her behavior is normal.          Assessment & Plan:

## 2014-09-01 NOTE — Assessment & Plan Note (Signed)
Monitor TFTs

## 2014-09-01 NOTE — Progress Notes (Signed)
Pre visit review using our clinic review tool, if applicable. No additional management support is needed unless otherwise documented below in the visit note. 

## 2014-09-02 LAB — BASIC METABOLIC PANEL
BUN: 18 mg/dL (ref 6–23)
CALCIUM: 10.2 mg/dL (ref 8.4–10.5)
CO2: 32 mEq/L (ref 19–32)
Chloride: 101 mEq/L (ref 96–112)
Creatinine, Ser: 1.2 mg/dL (ref 0.4–1.2)
GFR: 44.58 mL/min — ABNORMAL LOW (ref >60.00)
Glucose, Bld: 89 mg/dL (ref 70–99)
Potassium: 4.9 mEq/L (ref 3.5–5.1)
SODIUM: 140 meq/L (ref 135–145)

## 2014-09-02 LAB — TSH: TSH: 2.27 u[IU]/mL (ref 0.35–4.50)

## 2014-09-05 ENCOUNTER — Other Ambulatory Visit: Payer: Self-pay | Admitting: Internal Medicine

## 2014-09-07 ENCOUNTER — Other Ambulatory Visit: Payer: Self-pay | Admitting: Internal Medicine

## 2014-09-27 DIAGNOSIS — F329 Major depressive disorder, single episode, unspecified: Secondary | ICD-10-CM | POA: Diagnosis not present

## 2014-09-27 DIAGNOSIS — M1712 Unilateral primary osteoarthritis, left knee: Secondary | ICD-10-CM | POA: Diagnosis not present

## 2014-09-27 DIAGNOSIS — Z471 Aftercare following joint replacement surgery: Secondary | ICD-10-CM | POA: Diagnosis not present

## 2014-09-27 DIAGNOSIS — M25551 Pain in right hip: Secondary | ICD-10-CM | POA: Diagnosis not present

## 2014-09-27 DIAGNOSIS — Z96651 Presence of right artificial knee joint: Secondary | ICD-10-CM | POA: Diagnosis not present

## 2014-09-27 DIAGNOSIS — R0602 Shortness of breath: Secondary | ICD-10-CM | POA: Diagnosis not present

## 2014-09-27 DIAGNOSIS — Z86711 Personal history of pulmonary embolism: Secondary | ICD-10-CM | POA: Diagnosis not present

## 2014-09-27 DIAGNOSIS — H538 Other visual disturbances: Secondary | ICD-10-CM | POA: Diagnosis not present

## 2014-09-27 DIAGNOSIS — M160 Bilateral primary osteoarthritis of hip: Secondary | ICD-10-CM | POA: Diagnosis not present

## 2014-09-27 DIAGNOSIS — M7062 Trochanteric bursitis, left hip: Secondary | ICD-10-CM | POA: Diagnosis not present

## 2014-09-27 DIAGNOSIS — M25552 Pain in left hip: Secondary | ICD-10-CM | POA: Diagnosis not present

## 2014-09-27 DIAGNOSIS — R42 Dizziness and giddiness: Secondary | ICD-10-CM | POA: Diagnosis not present

## 2014-09-27 DIAGNOSIS — M170 Bilateral primary osteoarthritis of knee: Secondary | ICD-10-CM | POA: Diagnosis not present

## 2014-10-18 ENCOUNTER — Telehealth: Payer: Self-pay | Admitting: Vascular Surgery

## 2014-10-18 NOTE — Telephone Encounter (Signed)
-----   Message from Rolla Flatten, RN sent at 10/18/2014 12:37 PM EST ----- Regarding: RE: TFE pt She has a history of "blood clots that went to my lungs" so she wants a bilateral reflux study and to see Dr. Donnetta Hutching. She what you can do to get her in sooner rather than later. She said you can leave a message on her phone. Thx! ----- Message -----    From: Gena Fray    Sent: 10/18/2014   9:40 AM      To: [facility] Subject: TFE pt                                         Christina Mckinney would like to speak with a [facility] RN- she has seen [facility] in the past (09/2012) and is having problems again. She can be reached at [phone].  Thanks, Hinton Dyer

## 2014-10-18 NOTE — Telephone Encounter (Signed)
Left detailed message for patient, Christina Mckinney

## 2014-10-19 ENCOUNTER — Other Ambulatory Visit: Payer: Self-pay | Admitting: *Deleted

## 2014-10-19 DIAGNOSIS — I83893 Varicose veins of bilateral lower extremities with other complications: Secondary | ICD-10-CM

## 2014-10-20 ENCOUNTER — Encounter: Payer: Self-pay | Admitting: Vascular Surgery

## 2014-10-20 ENCOUNTER — Ambulatory Visit (HOSPITAL_COMMUNITY)
Admission: RE | Admit: 2014-10-20 | Discharge: 2014-10-20 | Disposition: A | Discharge status: Home / Self Care | Payer: Medicare Other | Source: Ambulatory Visit | Attending: Vascular Surgery | Admitting: Vascular Surgery

## 2014-10-20 DIAGNOSIS — I83893 Varicose veins of bilateral lower extremities with other complications: Secondary | ICD-10-CM | POA: Insufficient documentation

## 2014-10-21 ENCOUNTER — Encounter: Payer: Self-pay | Admitting: Vascular Surgery

## 2014-10-21 ENCOUNTER — Ambulatory Visit (INDEPENDENT_AMBULATORY_CARE_PROVIDER_SITE_OTHER): Payer: Medicare Other | Admitting: Vascular Surgery

## 2014-10-21 VITALS — BP 145/85 | HR 69 | Resp 16 | Ht 61.5 in | Wt 157.0 lb

## 2014-10-21 DIAGNOSIS — I872 Venous insufficiency (chronic) (peripheral): Secondary | ICD-10-CM | POA: Diagnosis not present

## 2014-10-21 DIAGNOSIS — I6529 Occlusion and stenosis of unspecified carotid artery: Secondary | ICD-10-CM

## 2014-10-21 NOTE — Progress Notes (Signed)
Here today for discussion regarding discomfort and weakness in both lower extremities. She is well-known to me from a prior laser ablation of her left great saphenous vein in 2013. She reports that over the past several weeks she has progressive weakness in both lower extremities. She also reports burning. She is on medication for restless leg syndrome is concerned maybe the medication is making this worse. She does have a history of pulmonary embolus is concerned about potential blood clots as well. No increase in swelling.  Past Medical History  Diagnosis Date  . Depressive disorder, not elsewhere classified   . Diverticulitis   . GERD (gastroesophageal reflux disease)   . Other and unspecified hyperlipidemia   . Unspecified essential hypertension   . Other abnormal glucose   . Unspecified hypothyroidism   . Personal history of colonic polyps   . Anxiety state, unspecified   . Personal history of peptic ulcer disease   . Osteoarthritis   . Gout, unspecified   . Essential and other specified forms of tremor   . Internal hemorrhoids without mention of complication   . Anemia, unspecified   . Unspecified disorder of bladder   . Pulmonary embolism   . DVT (deep venous thrombosis)   . Varicose veins   . Chest pain   . Edema 06/12/2014    History  Substance Use Topics  . Smoking status: Former Smoker    Types: Cigarettes    Quit date: 11/12/1988  . Smokeless tobacco: Never Used  . Alcohol Use: 2.4 oz/week    4 Not specified per week     Comment: occasional wine     Family History  Problem Relation Age of Onset  . Colon cancer Neg Hx   . Other Mother     varicose veins  . Heart disease Father   . Hyperlipidemia Father   . Heart attack Father   . Diabetes Daughter   . Heart disease Daughter   . Hyperlipidemia Daughter   . Hypertension Daughter     Allergies  Allergen Reactions  . Crestor [Rosuvastatin]     Left hip pain  . Lipitor [Atorvastatin]     Pain in hips  .  Red Dye     Current outpatient prescriptions: ALPRAZolam (XANAX) 0.25 MG tablet, TAKE 1 TABLET BY MOUTH TWICE A DAY AS NEEDED FOR ANXIETY, Disp: 60 tablet, Rfl: 3;  aspirin 81 MG tablet, Take 1 tablet (81 mg total) by mouth daily., Disp: 30 tablet, Rfl: ;  azelastine (ASTELIN) 137 MCG/SPRAY nasal spray, Place 1 spray into the nose daily as needed. Use in each nostril as directed, Disp: , Rfl:  Cholecalciferol (VITAMIN D) 2000 UNITS tablet, Take 2,000 Units by mouth daily.  , Disp: , Rfl: ;  co-enzyme Q-10 30 MG capsule, Take 30 mg by mouth daily. , Disp: , Rfl: ;  cycloSPORINE (RESTASIS) 0.05 % ophthalmic emulsion, Place 1 drop into both eyes 2 (two) times daily., Disp: 0.4 mL, Rfl: 3;  diclofenac sodium (VOLTAREN) 1 % GEL, Apply 1 application topically as needed., Disp: , Rfl:  fluticasone (FLONASE) 50 MCG/ACT nasal spray, Place 2 sprays into the nose as needed. Allergies, Disp: , Rfl: ;  fluticasone (FLOVENT HFA) 110 MCG/ACT inhaler, Inhale 2 puffs into the lungs 2 (two) times daily., Disp: 3 Inhaler, Rfl: 3;  furosemide (LASIX) 40 MG tablet, TAKE 1 TABLET DAILY AS DIRECTED, Disp: 90 tablet, Rfl: 2 gabapentin (NEURONTIN) 300 MG capsule, Take 1 capsule (300 mg total) by mouth 2 (two)  times daily., Disp: 60 capsule, Rfl: 3;  Glucosamine-Chondroitin 500-250 MG CAPS, Take 1 capsule by mouth as needed. , Disp: , Rfl: ;  HYDROcodone-acetaminophen (NORCO/VICODIN) 5-325 MG per tablet, Take 1 tablet by mouth every 12 (twelve) hours as needed., Disp: 60 tablet, Rfl: 0 lansoprazole (PREVACID) 30 MG capsule, Take 1 capsule (30 mg total) by mouth 2 (two) times daily before a meal., Disp: 180 capsule, Rfl: 1;  levothyroxine (SYNTHROID, LEVOTHROID) 50 MCG tablet, TAKE 1 TABLET DAILY BEFORE BREAKFAST, Disp: 90 tablet, Rfl: 1;  losartan (COZAAR) 50 MG tablet, TAKE 1 TABLET DAILY, Disp: 90 tablet, Rfl: 1;  methocarbamol (ROBAXIN) 500 MG tablet, TAKE 1 TABLET TWICE A DAY AS NEEDED FOR PAIN, Disp: 90 tablet, Rfl: 1 metoprolol  succinate (TOPROL-XL) 50 MG 24 hr tablet, TAKE 1 TABLET DAILY. TAKE WITH OR IMMEDIATELY FOLLOWING A MEAL., Disp: 90 tablet, Rfl: 0;  omeprazole (PRILOSEC) 20 MG capsule, Take 1 capsule (20 mg total) by mouth daily., Disp: 90 capsule, Rfl: 3;  rOPINIRole (REQUIP) 1 MG tablet, TAKE 1 TABLET AT BEDTIME, Disp: 90 tablet, Rfl: 1 triazolam (HALCION) 0.125 MG tablet, Take 1 tablet (0.125 mg total) by mouth at bedtime as needed for sleep., Disp: 30 tablet, Rfl: 2;  ULORIC 40 MG tablet, TAKE 1 TABLET DAILY, Disp: 90 tablet, Rfl: 3;  [DISCONTINUED] propranolol (INDERAL) 60 MG tablet, Take 1 tablet (60 mg total) by mouth 2 (two) times daily., Disp: 60 tablet, Rfl: 3  BP 145/85 mmHg  Pulse 69  Resp 16  Ht 5' 1.5" (1.562 m)  Wt 157 lb (71.215 kg)  BMI 29.19 kg/m2  Body mass index is 29.19 kg/(m^2).       On physical exam she is well-developed well-nourished female appearing stated age of 75 She is in no acute distress Grossly intact neurologically Respirations are equal in nonlabored Mild bilateral lower extremity swelling with some mild edema. Scattered telangiectasia over both legs with no large varicosities Palpable dorsalis pedis pulses bilaterally  She did undergo venous duplex today and this shows successful ablation of her left great saphenous vein. She does not have any reflux in her left small saphenous and does not have any reflux or greater or small saphenous on the right. There is no evidence of DVT. Has have some deep venous reflux in the common femoral vein bilaterally  Impression and plan: No evidence of venous or arterial pathology to explain her lower extremity weakness and discomfort. I reassured her with this and there was no evidence of DVT. She will continue her discussion with her medical doctor depending other potential etiologies.

## 2014-11-01 ENCOUNTER — Other Ambulatory Visit: Payer: Self-pay | Admitting: Internal Medicine

## 2014-11-01 ENCOUNTER — Encounter: Payer: Self-pay | Admitting: Internal Medicine

## 2014-11-01 ENCOUNTER — Ambulatory Visit (INDEPENDENT_AMBULATORY_CARE_PROVIDER_SITE_OTHER): Payer: Medicare Other | Admitting: Internal Medicine

## 2014-11-01 VITALS — BP 124/76 | HR 74 | Temp 98.2°F | Ht 61.5 in | Wt 152.0 lb

## 2014-11-01 DIAGNOSIS — I10 Essential (primary) hypertension: Secondary | ICD-10-CM | POA: Diagnosis not present

## 2014-11-01 DIAGNOSIS — G4700 Insomnia, unspecified: Secondary | ICD-10-CM

## 2014-11-01 DIAGNOSIS — M25569 Pain in unspecified knee: Secondary | ICD-10-CM | POA: Diagnosis not present

## 2014-11-01 DIAGNOSIS — G629 Polyneuropathy, unspecified: Secondary | ICD-10-CM

## 2014-11-01 DIAGNOSIS — R0989 Other specified symptoms and signs involving the circulatory and respiratory systems: Secondary | ICD-10-CM | POA: Diagnosis not present

## 2014-11-01 DIAGNOSIS — I6529 Occlusion and stenosis of unspecified carotid artery: Secondary | ICD-10-CM | POA: Diagnosis not present

## 2014-11-01 DIAGNOSIS — F5104 Psychophysiologic insomnia: Secondary | ICD-10-CM

## 2014-11-01 MED ORDER — TEMAZEPAM 7.5 MG PO CAPS: 7.5000 mg | ORAL_CAPSULE | Freq: Every evening | ORAL | Status: DC | PRN

## 2014-11-01 NOTE — Progress Notes (Signed)
Pre visit review using our clinic review tool, if applicable. No additional management support is needed unless otherwise documented below in the visit note. 

## 2014-11-01 NOTE — Progress Notes (Signed)
Subjective:    Patient ID: Christina Mckinney, female    DOB: 10/31/1932, 80 y.o.   MRN: [id number]  HPI  80 year old white female with history of hypertension, hypothyroidism and chronic insomnia for follow-up. Interval history patient seen by vascular specialist. She underwent treatment for varicose veins. Patient complains of persistent burning sensation in her lower extremities.  Chronic insomnia-patient only able to sleep 2-3 hours with triazolam. She had old prescription for diazepam 2 mg that she restarted. However she complains of daytime somnolence.  Anxiety - still struggling with anxiety symptoms.  Poor tolerance to SSRIs in the past.  Review of Systems Negative for chest pain, mild leg edema    Past Medical History  Diagnosis Date  . Depressive disorder, not elsewhere classified   . Diverticulitis   . GERD (gastroesophageal reflux disease)   . Other and unspecified hyperlipidemia   . Unspecified essential hypertension   . Other abnormal glucose   . Unspecified hypothyroidism   . Personal history of colonic polyps   . Anxiety state, unspecified   . Personal history of peptic ulcer disease   . Osteoarthritis   . Gout, unspecified   . Essential and other specified forms of tremor   . Internal hemorrhoids without mention of complication   . Anemia, unspecified   . Unspecified disorder of bladder   . Pulmonary embolism   . DVT (deep venous thrombosis)   . Varicose veins   . Chest pain   . Edema 06/12/2014    History   Social History  . Marital Status: Widowed    Spouse Name: N/A    Number of Children: 3  . Years of Education: N/A   Occupational History  . Retired     Social History Main Topics  . Smoking status: Former Smoker    Types: Cigarettes    Quit date: 11/12/1988  . Smokeless tobacco: Never Used  . Alcohol Use: 2.4 oz/week    4 Not specified per week     Comment: occasional wine   . Drug Use: No  . Sexual Activity: Not on file   Other Topics  Concern  . Not on file   Social History Narrative    Past Surgical History  Procedure Laterality Date  . Cholecystectomy    . Abdominal hysterectomy    . Nasal septum surgery    . Shoulder surgery    . Knee arthroscopy      right   . Eye surgery    . Endovenous ablation saphenous vein w/ laser  09-11-2012    left greater saphenous vein   by Curt Jews MD  . Total knee arthroplasty  09/2013    Right    Family History  Problem Relation Age of Onset  . Colon cancer Neg Hx   . Other Mother     varicose veins  . Heart disease Father   . Hyperlipidemia Father   . Heart attack Father   . Diabetes Daughter   . Heart disease Daughter   . Hyperlipidemia Daughter   . Hypertension Daughter     Allergies  Allergen Reactions  . Crestor [Rosuvastatin]     Left hip pain  . Lipitor [Atorvastatin]     Pain in hips  . Red Dye     Current Outpatient Prescriptions on File Prior to Visit  Medication Sig Dispense Refill  . aspirin 81 MG tablet Take 1 tablet (81 mg total) by mouth daily. 30 tablet   . azelastine (ASTELIN)  137 MCG/SPRAY nasal spray Place 1 spray into the nose daily as needed. Use in each nostril as directed    . Cholecalciferol (VITAMIN D) 2000 UNITS tablet Take 2,000 Units by mouth daily.      Marland Kitchen co-enzyme Q-10 30 MG capsule Take 30 mg by mouth daily.     . cycloSPORINE (RESTASIS) 0.05 % ophthalmic emulsion Place 1 drop into both eyes 2 (two) times daily. 0.4 mL 3  . diclofenac sodium (VOLTAREN) 1 % GEL Apply 1 application topically as needed.    . fluticasone (FLONASE) 50 MCG/ACT nasal spray Place 2 sprays into the nose as needed. Allergies    . fluticasone (FLOVENT HFA) 110 MCG/ACT inhaler Inhale 2 puffs into the lungs 2 (two) times daily. 3 Inhaler 3  . furosemide (LASIX) 40 MG tablet TAKE 1 TABLET DAILY AS DIRECTED 90 tablet 2  . gabapentin (NEURONTIN) 300 MG capsule Take 1 capsule (300 mg total) by mouth 2 (two) times daily. 60 capsule 3  . levothyroxine  (SYNTHROID, LEVOTHROID) 50 MCG tablet TAKE 1 TABLET DAILY BEFORE BREAKFAST 90 tablet 1  . losartan (COZAAR) 50 MG tablet TAKE 1 TABLET DAILY 90 tablet 1  . metoprolol succinate (TOPROL-XL) 50 MG 24 hr tablet TAKE 1 TABLET DAILY. TAKE WITH OR IMMEDIATELY FOLLOWING A MEAL. 90 tablet 0  . omeprazole (PRILOSEC) 20 MG capsule Take 1 capsule (20 mg total) by mouth daily. 90 capsule 3  . rOPINIRole (REQUIP) 1 MG tablet TAKE 1 TABLET AT BEDTIME 90 tablet 1  . ULORIC 40 MG tablet TAKE 1 TABLET DAILY 90 tablet 3  . [DISCONTINUED] propranolol (INDERAL) 60 MG tablet Take 1 tablet (60 mg total) by mouth 2 (two) times daily. 60 tablet 3   No current facility-administered medications on file prior to visit.    BP 124/76 mmHg  Pulse 74  Temp(Src) 98.2 F (36.8 C) (Oral)  Ht 5' 1.5" (1.562 m)  Wt 152 lb (68.947 kg)  BMI 28.26 kg/m2    Objective:   Physical Exam  Constitutional: She is oriented to person, place, and time. She appears well-developed and well-nourished. No distress.  HENT:  Head: Normocephalic and atraumatic.  Neck: Neck supple.  No carotid bruit  Cardiovascular: Normal rate, regular rhythm and normal heart sounds.   No murmur heard. Diminished lower extremity pulses  Pulmonary/Chest: Effort normal and breath sounds normal. She has no wheezes.  Neurological: She is alert and oriented to person, place, and time. No cranial nerve deficit.  Decreased sensation to temperature and vibration of lower extremities          Assessment & Plan:

## 2014-11-01 NOTE — Assessment & Plan Note (Signed)
Patient will only able to sleep 2-3 hours of triazolam. Switch to Restoril 7.5 mg daily at bedtime.

## 2014-11-01 NOTE — Assessment & Plan Note (Signed)
Patient complains of persistent lower extremity pain. I suspect symptoms secondary to peripheral neuropathy. Obtain nerve conduction studies to confirm. Previous B12 levels unremarkable. Check SPEP and UPEP.

## 2014-11-01 NOTE — Assessment & Plan Note (Signed)
Patient has diminished pulses in her lower extremities. Obtain arterial Doppler/ABI.

## 2014-11-02 ENCOUNTER — Other Ambulatory Visit (HOSPITAL_COMMUNITY): Payer: Self-pay | Admitting: *Deleted

## 2014-11-02 DIAGNOSIS — R0989 Other specified symptoms and signs involving the circulatory and respiratory systems: Secondary | ICD-10-CM

## 2014-11-02 DIAGNOSIS — M25569 Pain in unspecified knee: Secondary | ICD-10-CM

## 2014-11-02 LAB — PROTEIN ELECTROPHORESIS, URINE REFLEX

## 2014-11-03 LAB — PROTEIN ELECTROPHORESIS, SERUM
Albumin ELP: 61 % (ref 55.8–66.1)
Alpha-1-Globulin: 3.9 % (ref 2.9–4.9)
Alpha-2-Globulin: 11.2 % (ref 7.1–11.8)
BETA GLOBULIN: 6.1 % (ref 4.7–7.2)
Beta 2: 4.6 % (ref 3.2–6.5)
Gamma Globulin: 13.2 % (ref 11.1–18.8)
Total Protein, Serum Electrophoresis: 7.1 g/dL (ref 6.0–8.3)

## 2014-11-04 LAB — PROTEIN ELECTROPHORESIS, URINE REFLEX
Total Protein, Urine/Day: 58 mg/d (ref 50–100)
Total Protein, Urine: 5 mg/dL

## 2014-11-15 ENCOUNTER — Encounter (HOSPITAL_COMMUNITY): Payer: Medicare Other

## 2014-11-16 ENCOUNTER — Ambulatory Visit (HOSPITAL_COMMUNITY): Payer: Medicare Other | Attending: Cardiovascular Disease | Admitting: Cardiology

## 2014-11-16 DIAGNOSIS — R0989 Other specified symptoms and signs involving the circulatory and respiratory systems: Secondary | ICD-10-CM | POA: Diagnosis not present

## 2014-11-16 DIAGNOSIS — R209 Unspecified disturbances of skin sensation: Secondary | ICD-10-CM | POA: Diagnosis not present

## 2014-11-16 DIAGNOSIS — M79669 Pain in unspecified lower leg: Secondary | ICD-10-CM | POA: Diagnosis not present

## 2014-11-16 DIAGNOSIS — M25569 Pain in unspecified knee: Secondary | ICD-10-CM

## 2014-11-16 NOTE — Progress Notes (Signed)
Lower arterial Doppler performed  

## 2014-11-18 ENCOUNTER — Other Ambulatory Visit: Payer: Self-pay | Admitting: Internal Medicine

## 2014-12-10 ENCOUNTER — Other Ambulatory Visit: Payer: Self-pay | Admitting: Internal Medicine

## 2014-12-21 ENCOUNTER — Telehealth: Payer: Self-pay | Admitting: Cardiology

## 2014-12-21 DIAGNOSIS — I779 Disorder of arteries and arterioles, unspecified: Secondary | ICD-10-CM

## 2014-12-21 DIAGNOSIS — I739 Peripheral vascular disease, unspecified: Principal | ICD-10-CM

## 2014-12-21 NOTE — Telephone Encounter (Signed)
NEw MEssage  Pt wanted to know if a carotid ultrasound should be done soon. Please call back and discuss.

## 2014-12-21 NOTE — Telephone Encounter (Signed)
Returned call to patient schedulers will call back to schedule carotid dopplers.Carotid dopplers done 01/14/14 advised repeat in 1 year.

## 2014-12-22 ENCOUNTER — Other Ambulatory Visit: Payer: Self-pay | Admitting: Internal Medicine

## 2014-12-29 ENCOUNTER — Other Ambulatory Visit: Payer: Self-pay | Admitting: Internal Medicine

## 2015-01-03 ENCOUNTER — Encounter: Payer: Self-pay | Admitting: Internal Medicine

## 2015-01-03 ENCOUNTER — Ambulatory Visit (INDEPENDENT_AMBULATORY_CARE_PROVIDER_SITE_OTHER): Payer: Medicare Other | Admitting: Internal Medicine

## 2015-01-03 VITALS — BP 152/64 | HR 69 | Temp 98.5°F

## 2015-01-03 DIAGNOSIS — D6489 Other specified anemias: Secondary | ICD-10-CM | POA: Diagnosis not present

## 2015-01-03 DIAGNOSIS — F4323 Adjustment disorder with mixed anxiety and depressed mood: Secondary | ICD-10-CM

## 2015-01-03 DIAGNOSIS — I10 Essential (primary) hypertension: Secondary | ICD-10-CM | POA: Diagnosis not present

## 2015-01-03 DIAGNOSIS — K219 Gastro-esophageal reflux disease without esophagitis: Secondary | ICD-10-CM

## 2015-01-03 DIAGNOSIS — R413 Other amnesia: Secondary | ICD-10-CM | POA: Diagnosis not present

## 2015-01-03 DIAGNOSIS — E038 Other specified hypothyroidism: Secondary | ICD-10-CM

## 2015-01-03 MED ORDER — ALBUTEROL SULFATE HFA 108 (90 BASE) MCG/ACT IN AERS: 2.0000 | INHALATION_SPRAY | Freq: Four times a day (QID) | RESPIRATORY_TRACT | Status: DC | PRN

## 2015-01-03 MED ORDER — DONEPEZIL HCL 5 MG PO TABS: 5.0000 mg | ORAL_TABLET | Freq: Every day | ORAL | Status: DC

## 2015-01-03 MED ORDER — RANITIDINE HCL 150 MG PO TABS: 150.0000 mg | ORAL_TABLET | Freq: Two times a day (BID) | ORAL | Status: DC

## 2015-01-03 MED ORDER — SERTRALINE HCL 25 MG PO TABS: ORAL_TABLET | ORAL | Status: DC

## 2015-01-03 NOTE — Assessment & Plan Note (Signed)
Monitor CBC. Rule out anemia contributing to depressive symptoms.

## 2015-01-03 NOTE — Progress Notes (Signed)
Subjective:    Patient ID: Christina Mckinney, female    DOB: 09-23-1932, 81 y.o.   MRN: [id number]  HPI  81 year old white female with history of hypertension, adjustment disorder with a personal/anxiety and hypothyroidism for follow-up.  Patient has been struggling with depressive/anxiety symptoms ever since breakup with her significant other.  Patient also complains of memory loss/difficulty remembering names.  She had a cold within the last month. She has intermittent cough and upper airway wheezing.  No fever or chills.  No shortness of breath.  Hypertension - stable  GERD - patient concerned about side effects from long term use of PPI.  Review of Systems Fatigue, poor sleep quality.  She use temazepam 7.5 mg qhs prn    Past Medical History  Diagnosis Date  . Depressive disorder, not elsewhere classified   . Diverticulitis   . GERD (gastroesophageal reflux disease)   . Other and unspecified hyperlipidemia   . Unspecified essential hypertension   . Other abnormal glucose   . Unspecified hypothyroidism   . Personal history of colonic polyps   . Anxiety state, unspecified   . Personal history of peptic ulcer disease   . Osteoarthritis   . Gout, unspecified   . Essential and other specified forms of tremor   . Internal hemorrhoids without mention of complication   . Anemia, unspecified   . Unspecified disorder of bladder   . Pulmonary embolism   . DVT (deep venous thrombosis)   . Varicose veins   . Chest pain   . Edema 06/12/2014    History   Social History  . Marital Status: Widowed    Spouse Name: N/A  . Number of Children: 3  . Years of Education: N/A   Occupational History  . Retired     Social History Main Topics  . Smoking status: Former Smoker    Types: Cigarettes    Quit date: 11/12/1988  . Smokeless tobacco: Never Used  . Alcohol Use: 2.4 oz/week    4 Standard drinks or equivalent per week     Comment: occasional wine   . Drug Use: No  . Sexual  Activity: Not on file   Other Topics Concern  . Not on file   Social History Narrative    Past Surgical History  Procedure Laterality Date  . Cholecystectomy    . Abdominal hysterectomy    . Nasal septum surgery    . Shoulder surgery    . Knee arthroscopy      right   . Eye surgery    . Endovenous ablation saphenous vein w/ laser  09-11-2012    left greater saphenous vein   by Curt Jews MD  . Total knee arthroplasty  09/2013    Right    Family History  Problem Relation Age of Onset  . Colon cancer Neg Hx   . Other Mother     varicose veins  . Heart disease Father   . Hyperlipidemia Father   . Heart attack Father   . Diabetes Daughter   . Heart disease Daughter   . Hyperlipidemia Daughter   . Hypertension Daughter     Allergies  Allergen Reactions  . Crestor [Rosuvastatin]     Left hip pain  . Lipitor [Atorvastatin]     Pain in hips  . Red Dye     Current Outpatient Prescriptions on File Prior to Visit  Medication Sig Dispense Refill  . aspirin 81 MG tablet Take 1 tablet (81 mg  total) by mouth daily. 30 tablet   . azelastine (ASTELIN) 137 MCG/SPRAY nasal spray Place 1 spray into the nose daily as needed. Use in each nostril as directed    . Cholecalciferol (VITAMIN D) 2000 UNITS tablet Take 2,000 Units by mouth daily.      Marland Kitchen co-enzyme Q-10 30 MG capsule Take 30 mg by mouth daily.     . cycloSPORINE (RESTASIS) 0.05 % ophthalmic emulsion Place 1 drop into both eyes 2 (two) times daily. 0.4 mL 3  . diclofenac sodium (VOLTAREN) 1 % GEL Apply 1 application topically as needed.    . fluticasone (FLONASE) 50 MCG/ACT nasal spray Place 2 sprays into the nose as needed. Allergies    . fluticasone (FLOVENT HFA) 110 MCG/ACT inhaler Inhale 2 puffs into the lungs 2 (two) times daily. 3 Inhaler 3  . furosemide (LASIX) 40 MG tablet TAKE 1 TABLET DAILY AS DIRECTED 90 tablet 1  . gabapentin (NEURONTIN) 300 MG capsule TAKE 1 CAPSULE DAILY 90 capsule 1  . levothyroxine  (SYNTHROID, LEVOTHROID) 50 MCG tablet TAKE 1 TABLET DAILY BEFORE BREAKFAST 90 tablet 0  . losartan (COZAAR) 50 MG tablet TAKE 1 TABLET DAILY 90 tablet 1  . metoprolol succinate (TOPROL-XL) 50 MG 24 hr tablet TAKE 1 TABLET DAILY WITH OR IMMEDIATELY FOLLOWING A MEAL 90 tablet 0  . rOPINIRole (REQUIP) 1 MG tablet TAKE 1 TABLET AT BEDTIME 90 tablet 1  . temazepam (RESTORIL) 7.5 MG capsule Take 1 capsule (7.5 mg total) by mouth at bedtime as needed for sleep. 30 capsule 1  . traMADol (ULTRAM) 50 MG tablet Take 50 mg by mouth daily as needed.    Marland Kitchen ULORIC 40 MG tablet TAKE 1 TABLET DAILY 90 tablet 3  . [DISCONTINUED] propranolol (INDERAL) 60 MG tablet Take 1 tablet (60 mg total) by mouth 2 (two) times daily. 60 tablet 3   No current facility-administered medications on file prior to visit.    BP 152/64 mmHg  Pulse 69  Temp(Src) 98.5 F (36.9 C) (Oral)  Wt     Objective:   Physical Exam  Constitutional: She is oriented to person, place, and time. She appears well-developed and well-nourished. No distress.  HENT:  Head: Normocephalic and atraumatic.  Mouth/Throat: Oropharynx is clear and moist.  Neck: Neck supple.  Cardiovascular: Normal rate, regular rhythm and normal heart sounds.   No murmur heard. Pulmonary/Chest: Effort normal. She has no wheezes.  Musculoskeletal: She exhibits no edema.  Neurological: She is alert and oriented to person, place, and time. No cranial nerve deficit.  Skin: Skin is warm and dry.  Psychiatric: She has a normal mood and affect. Her behavior is normal.          Assessment & Plan:

## 2015-01-03 NOTE — Progress Notes (Signed)
Pre visit review using our clinic review tool, if applicable. No additional management support is needed unless otherwise documented below in the visit note. 

## 2015-01-03 NOTE — Assessment & Plan Note (Signed)
Patient again defers referral to counselor. Trial of sertraline 25 mg. Patient advised to take 1/2 tablet of 25 mg at bedtime for 7 days and titrate to 1 tablet at bedtime.

## 2015-01-03 NOTE — Assessment & Plan Note (Signed)
Patient concerned about possible long-term side effects of PPI use. Transition to ranitidine 150 mg twice daily.

## 2015-01-03 NOTE — Assessment & Plan Note (Signed)
Monitor TSH Lab Results  Component Value Date   TSH 2.27 09/01/2014

## 2015-01-04 LAB — CBC WITH DIFFERENTIAL/PLATELET
BASOS PCT: 0.3 % (ref 0.0–3.0)
Basophils Absolute: 0 10*3/uL (ref 0.0–0.1)
EOS ABS: 0.3 10*3/uL (ref 0.0–0.7)
EOS PCT: 2.8 % (ref 0.0–5.0)
HCT: 39.5 % (ref 36.0–46.0)
Hemoglobin: 13.2 g/dL (ref 12.0–15.0)
LYMPHS PCT: 24 % (ref 12.0–46.0)
Lymphs Abs: 2.2 10*3/uL (ref 0.7–4.0)
MCHC: 33.4 g/dL (ref 30.0–36.0)
MCV: 97 fl (ref 78.0–100.0)
MONOS PCT: 4.2 % (ref 3.0–12.0)
Monocytes Absolute: 0.4 10*3/uL (ref 0.1–1.0)
NEUTROS PCT: 68.7 % (ref 43.0–77.0)
Neutro Abs: 6.2 10*3/uL (ref 1.4–7.7)
Platelets: 216 10*3/uL (ref 150.0–400.0)
RBC: 4.07 Mil/uL (ref 3.87–5.11)
RDW: 14.7 % (ref 11.5–15.5)
WBC: 9 10*3/uL (ref 4.0–10.5)

## 2015-01-04 LAB — TSH: TSH: 1.42 u[IU]/mL (ref 0.35–4.50)

## 2015-01-04 LAB — HEPATIC FUNCTION PANEL
ALT: 9 U/L (ref 0–35)
AST: 25 U/L (ref 0–37)
Albumin: 4.4 g/dL (ref 3.5–5.2)
Alkaline Phosphatase: 71 U/L (ref 39–117)
BILIRUBIN DIRECT: 0.1 mg/dL (ref 0.0–0.3)
Total Bilirubin: 0.6 mg/dL (ref 0.2–1.2)
Total Protein: 7.3 g/dL (ref 6.0–8.3)

## 2015-01-04 LAB — BASIC METABOLIC PANEL
BUN: 21 mg/dL (ref 6–23)
CO2: 31 mEq/L (ref 19–32)
Calcium: 10.2 mg/dL (ref 8.4–10.5)
Chloride: 101 mEq/L (ref 96–112)
Creatinine, Ser: 1 mg/dL (ref 0.40–1.20)
GFR: 56.56 mL/min — AB (ref >60.00)
Glucose, Bld: 81 mg/dL (ref 70–99)
Potassium: 3.8 mEq/L (ref 3.5–5.1)
Sodium: 139 mEq/L (ref 135–145)

## 2015-01-04 LAB — VITAMIN B12: Vitamin B-12: >1500 pg/mL — ABNORMAL HIGH (ref 211–911)

## 2015-01-05 ENCOUNTER — Telehealth: Payer: Self-pay | Admitting: Internal Medicine

## 2015-01-05 NOTE — Telephone Encounter (Signed)
See result note.  

## 2015-01-05 NOTE — Telephone Encounter (Signed)
Pt returning your call . pls cb °

## 2015-01-21 ENCOUNTER — Other Ambulatory Visit: Payer: Self-pay | Admitting: Internal Medicine

## 2015-01-26 ENCOUNTER — Other Ambulatory Visit: Payer: Self-pay | Admitting: Internal Medicine

## 2015-01-27 ENCOUNTER — Other Ambulatory Visit: Payer: Self-pay | Admitting: Internal Medicine

## 2015-02-01 ENCOUNTER — Encounter (HOSPITAL_COMMUNITY): Payer: Medicare Other

## 2015-02-07 ENCOUNTER — Ambulatory Visit (HOSPITAL_COMMUNITY): Payer: Medicare Other | Attending: Internal Medicine | Admitting: Cardiology

## 2015-02-07 ENCOUNTER — Encounter: Payer: Self-pay | Admitting: Internal Medicine

## 2015-02-07 ENCOUNTER — Ambulatory Visit (INDEPENDENT_AMBULATORY_CARE_PROVIDER_SITE_OTHER): Payer: Medicare Other | Admitting: Internal Medicine

## 2015-02-07 VITALS — BP 114/58 | HR 68 | Temp 98.4°F | Ht 61.5 in

## 2015-02-07 DIAGNOSIS — I6523 Occlusion and stenosis of bilateral carotid arteries: Secondary | ICD-10-CM | POA: Diagnosis not present

## 2015-02-07 DIAGNOSIS — G250 Essential tremor: Secondary | ICD-10-CM

## 2015-02-07 DIAGNOSIS — F4323 Adjustment disorder with mixed anxiety and depressed mood: Secondary | ICD-10-CM | POA: Diagnosis not present

## 2015-02-07 DIAGNOSIS — R251 Tremor, unspecified: Secondary | ICD-10-CM

## 2015-02-07 DIAGNOSIS — G629 Polyneuropathy, unspecified: Secondary | ICD-10-CM | POA: Diagnosis not present

## 2015-02-07 DIAGNOSIS — I779 Disorder of arteries and arterioles, unspecified: Secondary | ICD-10-CM | POA: Diagnosis not present

## 2015-02-07 DIAGNOSIS — I739 Peripheral vascular disease, unspecified: Secondary | ICD-10-CM

## 2015-02-07 DIAGNOSIS — G252 Other specified forms of tremor: Secondary | ICD-10-CM

## 2015-02-07 MED ORDER — GABAPENTIN 100 MG PO CAPS: ORAL_CAPSULE | ORAL | Status: DC

## 2015-02-07 MED ORDER — PROPRANOLOL HCL ER 60 MG PO CP24: 60.0000 mg | ORAL_CAPSULE | Freq: Every day | ORAL | Status: DC

## 2015-02-07 MED ORDER — LORAZEPAM 0.5 MG PO TABS: 0.5000 mg | ORAL_TABLET | Freq: Two times a day (BID) | ORAL | Status: DC | PRN

## 2015-02-07 NOTE — Assessment & Plan Note (Signed)
Patient had to discontinue sertraline due to side effects. Patient reports feeling a "zombie". She also noticed increase in her benign tremor.  She has also tried and failed citalopram. I doubt she will tolerate SSRIs. Patient reports her anxiety/depressive symptoms improved. She struggles with chronic insomnia. Trial of low-dose lorazepam 2.5 mg daily at bedtime as needed.

## 2015-02-07 NOTE — Progress Notes (Signed)
Subjective:    Patient ID: Christina Mckinney, female    DOB: [birth year], 81 y.o.   MRN: [id number]  HPI  81 year old white female for follow-up regarding anxiety/depression.  Patient tried on sertraline 25 mg once daily. She took medication for 2-3 weeks but discontinued due to side effects. She reports feeling like a "zombie". It also worsened her chronic benigh tremor.  She has chronic insomnia. No significant improvement with Restoril 7.5 mg.  Chronic benign tremor-previously her tremor worse in her right hand but now she's having symptoms in her left hand.  Hypertension - stable  She also complains of chronic burning sensation in her lower legs.  She is not taking gabapentin on a regular basis.  Review of Systems Negative for chest pain, burning sensation in her legs    Past Medical History  Diagnosis Date  . Depressive disorder, not elsewhere classified   . Diverticulitis   . GERD (gastroesophageal reflux disease)   . Other and unspecified hyperlipidemia   . Unspecified essential hypertension   . Other abnormal glucose   . Unspecified hypothyroidism   . Personal history of colonic polyps   . Anxiety state, unspecified   . Personal history of peptic ulcer disease   . Osteoarthritis   . Gout, unspecified   . Essential and other specified forms of tremor   . Internal hemorrhoids without mention of complication   . Anemia, unspecified   . Unspecified disorder of bladder   . Pulmonary embolism   . DVT (deep venous thrombosis)   . Varicose veins   . Chest pain   . Edema 06/12/2014    History   Social History  . Marital Status: Widowed    Spouse Name: N/A  . Number of Children: 3  . Years of Education: N/A   Occupational History  . Retired     Social History Main Topics  . Smoking status: Former Smoker    Types: Cigarettes    Quit date: 11/12/1988  . Smokeless tobacco: Never Used  . Alcohol Use: 2.4 oz/week    4 Standard drinks or equivalent per week   Comment: occasional wine   . Drug Use: No  . Sexual Activity: Not on file   Other Topics Concern  . Not on file   Social History Narrative    Past Surgical History  Procedure Laterality Date  . Cholecystectomy    . Abdominal hysterectomy    . Nasal septum surgery    . Shoulder surgery    . Knee arthroscopy      right   . Eye surgery    . Endovenous ablation saphenous vein w/ laser  09-11-2012    left greater saphenous vein   by Curt Jews MD  . Total knee arthroplasty  09/2013    Right    Family History  Problem Relation Age of Onset  . Colon cancer Neg Hx   . Other Mother     varicose veins  . Heart disease Father   . Hyperlipidemia Father   . Heart attack Father   . Diabetes Daughter   . Heart disease Daughter   . Hyperlipidemia Daughter   . Hypertension Daughter     Allergies  Allergen Reactions  . Crestor [Rosuvastatin]     Left hip pain  . Lipitor [Atorvastatin]     Pain in hips  . Red Dye     Current Outpatient Prescriptions on File Prior to Visit  Medication Sig Dispense Refill  . albuterol (  PROVENTIL HFA;VENTOLIN HFA) 108 (90 BASE) MCG/ACT inhaler Inhale 2 puffs into the lungs every 6 (six) hours as needed for wheezing or shortness of breath. 1 Inhaler 2  . aspirin 81 MG tablet Take 1 tablet (81 mg total) by mouth daily. 30 tablet   . azelastine (ASTELIN) 137 MCG/SPRAY nasal spray Place 1 spray into the nose daily as needed. Use in each nostril as directed    . Cholecalciferol (VITAMIN D) 2000 UNITS tablet Take 2,000 Units by mouth daily.      Marland Kitchen co-enzyme Q-10 30 MG capsule Take 30 mg by mouth daily.     . diclofenac sodium (VOLTAREN) 1 % GEL Apply 1 application topically as needed.    . donepezil (ARICEPT) 5 MG tablet Take 1 tablet (5 mg total) by mouth at bedtime. 30 tablet 2  . fluticasone (FLONASE) 50 MCG/ACT nasal spray Place 2 sprays into the nose as needed. Allergies    . fluticasone (FLOVENT HFA) 110 MCG/ACT inhaler Inhale 2 puffs into the  lungs 2 (two) times daily. 3 Inhaler 3  . furosemide (LASIX) 40 MG tablet TAKE 1 TABLET DAILY AS DIRECTED 90 tablet 1  . levothyroxine (SYNTHROID, LEVOTHROID) 50 MCG tablet TAKE 1 TABLET DAILY BEFORE BREAKFAST 90 tablet 0  . losartan (COZAAR) 50 MG tablet TAKE 1 TABLET DAILY 90 tablet 0  . ranitidine (ZANTAC) 150 MG tablet Take 1 tablet (150 mg total) by mouth 2 (two) times daily. 180 tablet 1  . RESTASIS 0.05 % ophthalmic emulsion INSTILL ONE DROP IN EACH EYE TWICE A DAY 6 each 2  . rOPINIRole (REQUIP) 1 MG tablet TAKE 1 TABLET AT BEDTIME 90 tablet 0  . temazepam (RESTORIL) 7.5 MG capsule Take 1 capsule (7.5 mg total) by mouth at bedtime as needed for sleep. 30 capsule 1  . traMADol (ULTRAM) 50 MG tablet Take 50 mg by mouth daily as needed.    Marland Kitchen ULORIC 40 MG tablet TAKE 1 TABLET DAILY 90 tablet 3   No current facility-administered medications on file prior to visit.    BP 114/58 mmHg  Pulse 68  Temp(Src) 98.4 F (36.9 C) (Oral)  Ht 5' 1.5" (1.562 m)    Objective:   Physical Exam  Constitutional: She is oriented to person, place, and time. She appears well-developed and well-nourished. No distress.  HENT:  Head: Normocephalic and atraumatic.  Cardiovascular: Normal rate, regular rhythm and normal heart sounds.   No murmur heard. Pulmonary/Chest: Effort normal and breath sounds normal. She has no wheezes.  Musculoskeletal: She exhibits no edema.  Neurological: She is alert and oriented to person, place, and time. No cranial nerve deficit.  Psychiatric: She has a normal mood and affect. Her behavior is normal.          Assessment & Plan:

## 2015-02-07 NOTE — Assessment & Plan Note (Signed)
Patient has not been using gabapentin. Reduced dose to 100 mg. Take 1-2 caps at bedtime.

## 2015-02-07 NOTE — Assessment & Plan Note (Addendum)
Patient reports worsening symptoms of benign tremor. Discontinue metoprolol. Switch to Inderal LA 60 mg once daily

## 2015-02-07 NOTE — Progress Notes (Signed)
Pt refuse to weigh.  Pre visit review using our clinic review tool, if applicable. No additional management support is needed unless otherwise documented below in the visit note.

## 2015-02-07 NOTE — Progress Notes (Signed)
Carotid duplex performed 

## 2015-02-24 ENCOUNTER — Other Ambulatory Visit: Payer: Self-pay

## 2015-02-24 MED ORDER — FLUTICASONE PROPIONATE 50 MCG/ACT NA SUSP: 2.0000 | NASAL | Status: DC | PRN

## 2015-02-24 MED ORDER — PROPRANOLOL HCL ER 60 MG PO CP24: 60.0000 mg | ORAL_CAPSULE | Freq: Every day | ORAL | Status: DC

## 2015-02-24 MED ORDER — GABAPENTIN 100 MG PO CAPS: ORAL_CAPSULE | ORAL | Status: DC

## 2015-02-24 MED ORDER — RANITIDINE HCL 150 MG PO TABS: 150.0000 mg | ORAL_TABLET | Freq: Two times a day (BID) | ORAL | Status: DC

## 2015-02-24 MED ORDER — DONEPEZIL HCL 5 MG PO TABS: 5.0000 mg | ORAL_TABLET | Freq: Every day | ORAL | Status: DC

## 2015-02-24 NOTE — Telephone Encounter (Signed)
Received prescription request from [facility] for:  Lorazepam 0.5 mg tablet Propranolol Hcl ER Caps 60 mg Gabapentin 100 mg capsules Ranitidine 150 mg tablets Donepezil HCL 5 mg tablet Fluticasone prop nsl sp 50 mcg  Please advise.

## 2015-02-28 ENCOUNTER — Other Ambulatory Visit: Payer: Self-pay

## 2015-02-28 MED ORDER — FLUTICASONE PROPIONATE 50 MCG/ACT NA SUSP: 2.0000 | NASAL | Status: DC | PRN

## 2015-03-02 ENCOUNTER — Other Ambulatory Visit: Payer: Self-pay | Admitting: Internal Medicine

## 2015-03-02 MED ORDER — LORAZEPAM 0.5 MG PO TABS: 0.5000 mg | ORAL_TABLET | Freq: Two times a day (BID) | ORAL | Status: DC | PRN

## 2015-03-07 ENCOUNTER — Ambulatory Visit: Payer: Medicare Other | Admitting: Internal Medicine

## 2015-03-16 ENCOUNTER — Encounter: Payer: Self-pay | Admitting: Cardiology

## 2015-03-16 ENCOUNTER — Ambulatory Visit (INDEPENDENT_AMBULATORY_CARE_PROVIDER_SITE_OTHER): Payer: Medicare Other | Admitting: Cardiology

## 2015-03-16 VITALS — BP 122/66 | HR 74 | Ht 61.0 in | Wt 157.0 lb

## 2015-03-16 DIAGNOSIS — I10 Essential (primary) hypertension: Secondary | ICD-10-CM

## 2015-03-16 DIAGNOSIS — I739 Peripheral vascular disease, unspecified: Principal | ICD-10-CM

## 2015-03-16 DIAGNOSIS — I779 Disorder of arteries and arterioles, unspecified: Secondary | ICD-10-CM

## 2015-03-16 NOTE — Progress Notes (Signed)
Christina Mckinney Date of Birth: [birth year] Medical Record #[id number]  History of Present Illness: Christina Mckinney is seen for follow up. She has a history of hypertension, hyperlipidemia, and carotid arterial disease. She was evaluated 8/14 for chest pain and had a normal myoview study. From a cardiac standpoint she has done well. She denies any recurrent chest pain.  She did have a cardiac catheterization at [facility] in 2001 and this was apparently normal. She complains of burning in her legs bilaterally from the knees down. Was scheduled for LE arterial dopplers but has not had this done. Legs get tired easily. She did develop a tremor that has improved on Inderal.  Current Outpatient Prescriptions on File Prior to Visit  Medication Sig Dispense Refill  . albuterol (PROVENTIL HFA;VENTOLIN HFA) 108 (90 BASE) MCG/ACT inhaler Inhale 2 puffs into the lungs Mckinney 6 (six) hours as needed for wheezing or shortness of breath. 1 Inhaler 2  . aspirin 81 MG tablet Take 1 tablet (81 mg total) by mouth daily. 30 tablet   . azelastine (ASTELIN) 137 MCG/SPRAY nasal spray Place 1 spray into the nose daily as needed. Use in each nostril as directed    . Cholecalciferol (VITAMIN D) 2000 UNITS tablet Take 2,000 Units by mouth daily.      Marland Kitchen co-enzyme Q-10 30 MG capsule Take 30 mg by mouth daily.     . diclofenac sodium (VOLTAREN) 1 % GEL Apply 1 application topically as needed.    . donepezil (ARICEPT) 5 MG tablet Take 1 tablet (5 mg total) by mouth at bedtime. 90 tablet 1  . fluticasone (FLONASE) 50 MCG/ACT nasal spray Place 2 sprays into both nostrils as needed. Allergies 50 g 1  . fluticasone (FLOVENT HFA) 110 MCG/ACT inhaler Inhale 2 puffs into the lungs 2 (two) times daily. 3 Inhaler 3  . furosemide (LASIX) 40 MG tablet TAKE 1 TABLET DAILY AS DIRECTED 90 tablet 1  . gabapentin (NEURONTIN) 100 MG capsule Use one to two capsules at bedtime as directed 180 capsule 1  . levothyroxine (SYNTHROID,  LEVOTHROID) 50 MCG tablet TAKE 1 TABLET DAILY BEFORE BREAKFAST 90 tablet 1  . LORazepam (ATIVAN) 0.5 MG tablet Take 1 tablet (0.5 mg total) by mouth 2 (two) times daily as needed for anxiety. 90 tablet 0  . losartan (COZAAR) 50 MG tablet TAKE 1 TABLET DAILY 90 tablet 0  . propranolol ER (INDERAL LA) 60 MG 24 hr capsule Take 1 capsule (60 mg total) by mouth daily. 90 capsule 1  . ranitidine (ZANTAC) 150 MG tablet Take 1 tablet (150 mg total) by mouth 2 (two) times daily. 180 tablet 1  . RESTASIS 0.05 % ophthalmic emulsion INSTILL ONE DROP IN EACH EYE TWICE A DAY 6 each 2  . rOPINIRole (REQUIP) 1 MG tablet TAKE 1 TABLET AT BEDTIME 90 tablet 0  . temazepam (RESTORIL) 7.5 MG capsule Take 1 capsule (7.5 mg total) by mouth at bedtime as needed for sleep. 30 capsule 1  . traMADol (ULTRAM) 50 MG tablet Take 50 mg by mouth daily as needed.    Marland Kitchen ULORIC 40 MG tablet TAKE 1 TABLET DAILY 90 tablet 3   No current facility-administered medications on file prior to visit.    Allergies  Allergen Reactions  . Crestor [Rosuvastatin]     Left hip pain  . Lipitor [Atorvastatin]     Pain in hips  . Red Dye     Past Medical History  Diagnosis Date  .  Depressive disorder, not elsewhere classified   . Diverticulitis   . GERD (gastroesophageal reflux disease)   . Other and unspecified hyperlipidemia   . Unspecified essential hypertension   . Other abnormal glucose   . Unspecified hypothyroidism   . Personal history of colonic polyps   . Anxiety state, unspecified   . Personal history of peptic ulcer disease   . Osteoarthritis   . Gout, unspecified   . Essential and other specified forms of tremor   . Internal hemorrhoids without mention of complication   . Anemia, unspecified   . Unspecified disorder of bladder   . Pulmonary embolism   . DVT (deep venous thrombosis)   . Varicose veins   . Chest pain   . Edema 06/12/2014    Past Surgical History  Procedure Laterality Date  . Cholecystectomy     . Abdominal hysterectomy    . Nasal septum surgery    . Shoulder surgery    . Knee arthroscopy      right   . Eye surgery    . Endovenous ablation saphenous vein w/ laser  09-11-2012    left greater saphenous vein   by Curt Jews MD  . Total knee arthroplasty  09/2013    Right    History  Smoking status  . Former Smoker  . Types: Cigarettes  . Quit date: 11/12/1988  Smokeless tobacco  . Never Used    History  Alcohol Use  . 2.4 oz/week  . 4 Standard drinks or equivalent per week    Comment: occasional wine     Family History  Problem Relation Age of Onset  . Colon cancer Neg Hx   . Other Mother     varicose veins  . Heart disease Father   . Hyperlipidemia Father   . Heart attack Father   . Diabetes Daughter   . Heart disease Daughter   . Hyperlipidemia Daughter   . Hypertension Daughter     Review of Systems: As noted in HPI. All other systems were reviewed and are negative.  Physical Exam: BP 122/66 mmHg  Pulse 74  Ht 5\' 1"  (1.549 m)  Wt 157 lb (71.215 kg)  BMI 29.68 kg/m2 She is a pleasant white female in no acute distress. HEENT: Normocephalic, atraumatic. Pupils equal round and reactive to light and accommodation. Extraocular movements are full. Sclera are clear. Oropharynx is clear. Neck is supple without JVD, adenopathy, thyromegaly, or bruits. Lungs: Clear Cardiovascular: Regular rate and rhythm, normal S1 and S2, no gallop, murmur, or click. Abdomen: Soft and nontender. No masses or hepatosplenomegaly. Bowel sounds are positive. Extremities: Arthritic changes in her hands, knees, and feet. No edema. Pulses are 2+. Skin: Warm and dry Neuro: Alert and oriented x3. Cranial nerves II through XII are intact.  LABORATORY DATA:  ECG today shows NSR and is normal.I have personally reviewed and interpreted this study.  Lab Results  Component Value Date   WBC 9.0 01/03/2015   HGB 13.2 01/03/2015   HCT 39.5 01/03/2015   PLT 216.0 01/03/2015    GLUCOSE 81 01/03/2015   CHOL 190 11/03/2012   TRIG 133.0 11/03/2012   HDL 49.80 11/03/2012   LDLCALC 114* 11/03/2012   ALT 9 01/03/2015   AST 25 01/03/2015   NA 139 01/03/2015   K 3.8 01/03/2015   CL 101 01/03/2015   CREATININE 1.00 01/03/2015   BUN 21 01/03/2015   CO2 31 01/03/2015   TSH 1.42 01/03/2015   INR 1.0 07/21/2010  HGBA1C 5.8 01/14/2012   Recent carotid Dopplers demonstrated moderate bilateral carotid disease-stable.  Assessment / Plan: 1. Chest pain. Resolved. Patient does have multiple cardiac risk factors including hypertension, hyperlipidemia, and carotid arterial disease. Myoview study in August 2014 was normal. Will monitor  2. Hypertension-controlled  3. Hyperlipidemia. Patient is intolerant to statin therapy.   4. Carotid arterial disease. Stable. Follow up in one year.  5. Leg burning- I suspect this is more related to peripheral neuropathy but I think it would be appropriate to check LE arterial dopplers and encouraged her to have this done.

## 2015-03-16 NOTE — Patient Instructions (Signed)
Continue your current therapy  I will see you in one year   

## 2015-03-23 ENCOUNTER — Ambulatory Visit (INDEPENDENT_AMBULATORY_CARE_PROVIDER_SITE_OTHER): Payer: Medicare Other | Admitting: Family Medicine

## 2015-03-23 ENCOUNTER — Ambulatory Visit: Payer: Medicare Other | Admitting: Internal Medicine

## 2015-03-23 ENCOUNTER — Encounter: Payer: Self-pay | Admitting: Family Medicine

## 2015-03-23 VITALS — BP 124/70 | HR 78 | Temp 98.5°F

## 2015-03-23 DIAGNOSIS — R062 Wheezing: Secondary | ICD-10-CM

## 2015-03-23 DIAGNOSIS — M170 Bilateral primary osteoarthritis of knee: Secondary | ICD-10-CM | POA: Diagnosis not present

## 2015-03-23 MED ORDER — DOXYCYCLINE HYCLATE 100 MG PO CAPS: 100.0000 mg | ORAL_CAPSULE | Freq: Two times a day (BID) | ORAL | Status: DC

## 2015-03-23 MED ORDER — PREDNISONE 20 MG PO TABS: ORAL_TABLET | ORAL | Status: DC

## 2015-03-23 NOTE — Progress Notes (Signed)
   Subjective:    Patient ID: Christina Mckinney, female    DOB: 01-27-1932, 81 y.o.   MRN: [id number]  HPI Cough for 3 days. She's had some associated wheezing. Cough is occasionally productive. She quit smoking way back around 1990. She has tried Mucinex and had some leftover albuterol which has helped slightly. She's never had any fever. She's had some sinus pressure diffusely maxillary region. No headaches. Mild body aches.  Past Medical History  Diagnosis Date  . Depressive disorder, not elsewhere classified   . Diverticulitis   . GERD (gastroesophageal reflux disease)   . Other and unspecified hyperlipidemia   . Unspecified essential hypertension   . Other abnormal glucose   . Unspecified hypothyroidism   . Personal history of colonic polyps   . Anxiety state, unspecified   . Personal history of peptic ulcer disease   . Osteoarthritis   . Gout, unspecified   . Essential and other specified forms of tremor   . Internal hemorrhoids without mention of complication   . Anemia, unspecified   . Unspecified disorder of bladder   . Pulmonary embolism   . DVT (deep venous thrombosis)   . Varicose veins   . Chest pain   . Edema 06/12/2014   Past Surgical History  Procedure Laterality Date  . Cholecystectomy    . Abdominal hysterectomy    . Nasal septum surgery    . Shoulder surgery    . Knee arthroscopy      right   . Eye surgery    . Endovenous ablation saphenous vein w/ laser  09-11-2012    left greater saphenous vein   by Curt Jews MD  . Total knee arthroplasty  09/2013    Right    reports that she quit smoking about 26 years ago. Her smoking use included Cigarettes. She has never used smokeless tobacco. She reports that she drinks about 2.4 oz of alcohol per week. She reports that she does not use illicit drugs. family history includes Diabetes in her daughter; Heart attack in her father; Heart disease in her daughter and father; Hyperlipidemia in her daughter and father;  Hypertension in her daughter; Other in her mother. There is no history of Colon cancer. Allergies  Allergen Reactions  . Crestor [Rosuvastatin]     Left hip pain  . Lipitor [Atorvastatin]     Pain in hips  . Red Dye       Review of Systems  Constitutional: Negative for fever and chills.  HENT: Positive for congestion.   Respiratory: Positive for cough and wheezing.        Objective:   Physical Exam  Constitutional: She appears well-developed and well-nourished.  HENT:  Right Ear: External ear normal.  Left Ear: External ear normal.  Mouth/Throat: Oropharynx is clear and moist.  Neck: Neck supple. No thyromegaly present.  Cardiovascular: Normal rate and regular rhythm.   Pulmonary/Chest: Effort normal. She has wheezes. She has no rales.          Assessment & Plan:  Cough with associated wheezing. Prednisone 20 mg 2 tablets daily for 5 days. Continue albuterol as needed. Start doxycycline 100 mg twice a day for any fever or worsening productive cough.

## 2015-03-23 NOTE — Patient Instructions (Signed)
May continue with the albuterol for wheezing/cough but do not exceed every 4 hours as needed.

## 2015-03-23 NOTE — Progress Notes (Signed)
Pre visit review using our clinic review tool, if applicable. No additional management support is needed unless otherwise documented below in the visit note. 

## 2015-03-24 DIAGNOSIS — H8111 Benign paroxysmal vertigo, right ear: Secondary | ICD-10-CM | POA: Diagnosis not present

## 2015-03-24 DIAGNOSIS — M25559 Pain in unspecified hip: Secondary | ICD-10-CM | POA: Diagnosis not present

## 2015-03-28 DIAGNOSIS — M25559 Pain in unspecified hip: Secondary | ICD-10-CM | POA: Diagnosis not present

## 2015-03-28 DIAGNOSIS — H8111 Benign paroxysmal vertigo, right ear: Secondary | ICD-10-CM | POA: Diagnosis not present

## 2015-03-30 DIAGNOSIS — M7062 Trochanteric bursitis, left hip: Secondary | ICD-10-CM | POA: Diagnosis not present

## 2015-03-30 DIAGNOSIS — M170 Bilateral primary osteoarthritis of knee: Secondary | ICD-10-CM | POA: Diagnosis not present

## 2015-04-04 DIAGNOSIS — H8111 Benign paroxysmal vertigo, right ear: Secondary | ICD-10-CM | POA: Diagnosis not present

## 2015-04-04 DIAGNOSIS — M25559 Pain in unspecified hip: Secondary | ICD-10-CM | POA: Diagnosis not present

## 2015-04-06 DIAGNOSIS — M170 Bilateral primary osteoarthritis of knee: Secondary | ICD-10-CM | POA: Diagnosis not present

## 2015-04-06 DIAGNOSIS — M7062 Trochanteric bursitis, left hip: Secondary | ICD-10-CM | POA: Diagnosis not present

## 2015-04-13 DIAGNOSIS — M25559 Pain in unspecified hip: Secondary | ICD-10-CM | POA: Diagnosis not present

## 2015-04-13 DIAGNOSIS — H8111 Benign paroxysmal vertigo, right ear: Secondary | ICD-10-CM | POA: Diagnosis not present

## 2015-04-19 ENCOUNTER — Observation Stay (HOSPITAL_COMMUNITY)
Admission: EM | Admit: 2015-04-19 | Discharge: 2015-04-25 | Disposition: A | Discharge status: Skilled Nursing Facility | Payer: No Typology Code available for payment source | Attending: General Surgery | Admitting: General Surgery

## 2015-04-19 ENCOUNTER — Encounter (HOSPITAL_COMMUNITY): Payer: Self-pay | Admitting: Emergency Medicine

## 2015-04-19 ENCOUNTER — Emergency Department (HOSPITAL_COMMUNITY): Payer: No Typology Code available for payment source

## 2015-04-19 ENCOUNTER — Other Ambulatory Visit: Payer: Self-pay | Admitting: Obstetrics and Gynecology

## 2015-04-19 DIAGNOSIS — E785 Hyperlipidemia, unspecified: Secondary | ICD-10-CM | POA: Diagnosis not present

## 2015-04-19 DIAGNOSIS — R079 Chest pain, unspecified: Secondary | ICD-10-CM | POA: Diagnosis not present

## 2015-04-19 DIAGNOSIS — F419 Anxiety disorder, unspecified: Secondary | ICD-10-CM | POA: Insufficient documentation

## 2015-04-19 DIAGNOSIS — F329 Major depressive disorder, single episode, unspecified: Secondary | ICD-10-CM | POA: Insufficient documentation

## 2015-04-19 DIAGNOSIS — Z87891 Personal history of nicotine dependence: Secondary | ICD-10-CM | POA: Diagnosis not present

## 2015-04-19 DIAGNOSIS — M8580 Other specified disorders of bone density and structure, unspecified site: Secondary | ICD-10-CM | POA: Insufficient documentation

## 2015-04-19 DIAGNOSIS — E039 Hypothyroidism, unspecified: Secondary | ICD-10-CM | POA: Diagnosis not present

## 2015-04-19 DIAGNOSIS — S2190XA Unspecified open wound of unspecified part of thorax, initial encounter: Secondary | ICD-10-CM | POA: Diagnosis not present

## 2015-04-19 DIAGNOSIS — Z124 Encounter for screening for malignant neoplasm of cervix: Secondary | ICD-10-CM | POA: Diagnosis not present

## 2015-04-19 DIAGNOSIS — R109 Unspecified abdominal pain: Secondary | ICD-10-CM | POA: Diagnosis not present

## 2015-04-19 DIAGNOSIS — K219 Gastro-esophageal reflux disease without esophagitis: Secondary | ICD-10-CM | POA: Insufficient documentation

## 2015-04-19 DIAGNOSIS — S3993XA Unspecified injury of pelvis, initial encounter: Secondary | ICD-10-CM | POA: Diagnosis not present

## 2015-04-19 DIAGNOSIS — I8390 Asymptomatic varicose veins of unspecified lower extremity: Secondary | ICD-10-CM | POA: Diagnosis not present

## 2015-04-19 DIAGNOSIS — I10 Essential (primary) hypertension: Secondary | ICD-10-CM | POA: Insufficient documentation

## 2015-04-19 DIAGNOSIS — S2222XA Fracture of body of sternum, initial encounter for closed fracture: Principal | ICD-10-CM | POA: Insufficient documentation

## 2015-04-19 DIAGNOSIS — S2220XA Unspecified fracture of sternum, initial encounter for closed fracture: Secondary | ICD-10-CM | POA: Diagnosis present

## 2015-04-19 DIAGNOSIS — Z86718 Personal history of other venous thrombosis and embolism: Secondary | ICD-10-CM | POA: Diagnosis not present

## 2015-04-19 DIAGNOSIS — M1990 Unspecified osteoarthritis, unspecified site: Secondary | ICD-10-CM | POA: Insufficient documentation

## 2015-04-19 DIAGNOSIS — M109 Gout, unspecified: Secondary | ICD-10-CM | POA: Diagnosis not present

## 2015-04-19 DIAGNOSIS — Z86711 Personal history of pulmonary embolism: Secondary | ICD-10-CM | POA: Diagnosis not present

## 2015-04-19 DIAGNOSIS — K648 Other hemorrhoids: Secondary | ICD-10-CM | POA: Insufficient documentation

## 2015-04-19 DIAGNOSIS — R51 Headache: Secondary | ICD-10-CM | POA: Insufficient documentation

## 2015-04-19 DIAGNOSIS — R112 Nausea with vomiting, unspecified: Secondary | ICD-10-CM | POA: Diagnosis not present

## 2015-04-19 DIAGNOSIS — S8012XA Contusion of left lower leg, initial encounter: Secondary | ICD-10-CM | POA: Diagnosis not present

## 2015-04-19 DIAGNOSIS — S3991XA Unspecified injury of abdomen, initial encounter: Secondary | ICD-10-CM | POA: Diagnosis not present

## 2015-04-19 DIAGNOSIS — M545 Low back pain: Secondary | ICD-10-CM | POA: Diagnosis not present

## 2015-04-19 DIAGNOSIS — D649 Anemia, unspecified: Secondary | ICD-10-CM | POA: Insufficient documentation

## 2015-04-19 DIAGNOSIS — Z86010 Personal history of colonic polyps: Secondary | ICD-10-CM | POA: Diagnosis not present

## 2015-04-19 DIAGNOSIS — N63 Unspecified lump in breast: Secondary | ICD-10-CM | POA: Diagnosis not present

## 2015-04-19 DIAGNOSIS — R1010 Upper abdominal pain, unspecified: Secondary | ICD-10-CM | POA: Diagnosis not present

## 2015-04-19 DIAGNOSIS — Z96651 Presence of right artificial knee joint: Secondary | ICD-10-CM | POA: Diagnosis not present

## 2015-04-19 DIAGNOSIS — S8991XA Unspecified injury of right lower leg, initial encounter: Secondary | ICD-10-CM | POA: Diagnosis not present

## 2015-04-19 LAB — BASIC METABOLIC PANEL
ANION GAP: 9 (ref 5–15)
BUN: 19 mg/dL (ref 6–20)
CALCIUM: 9.9 mg/dL (ref 8.9–10.3)
CHLORIDE: 104 mmol/L (ref 101–111)
CO2: 28 mmol/L (ref 22–32)
CREATININE: 1.25 mg/dL — AB (ref 0.44–1.00)
GFR calc Af Amer: 45 mL/min — ABNORMAL LOW (ref >60)
GFR, EST NON AFRICAN AMERICAN: 39 mL/min — AB (ref >60)
GLUCOSE: 93 mg/dL (ref 65–99)
POTASSIUM: 3.5 mmol/L (ref 3.5–5.1)
Sodium: 141 mmol/L (ref 135–145)

## 2015-04-19 LAB — CBC WITH DIFFERENTIAL/PLATELET
Basophils Absolute: 0 10*3/uL (ref 0.0–0.1)
Basophils Relative: 0 % (ref 0–1)
Eosinophils Absolute: 0.3 10*3/uL (ref 0.0–0.7)
Eosinophils Relative: 2 % (ref 0–5)
HCT: 38.3 % (ref 36.0–46.0)
HEMOGLOBIN: 12.5 g/dL (ref 12.0–15.0)
LYMPHS PCT: 17 % (ref 12–46)
Lymphs Abs: 2.1 10*3/uL (ref 0.7–4.0)
MCH: 31.5 pg (ref 26.0–34.0)
MCHC: 32.6 g/dL (ref 30.0–36.0)
MCV: 96.5 fL (ref 78.0–100.0)
MONOS PCT: 6 % (ref 3–12)
Monocytes Absolute: 0.7 10*3/uL (ref 0.1–1.0)
NEUTROS PCT: 75 % (ref 43–77)
Neutro Abs: 9.3 10*3/uL — ABNORMAL HIGH (ref 1.7–7.7)
PLATELETS: 206 10*3/uL (ref 150–400)
RBC: 3.97 MIL/uL (ref 3.87–5.11)
RDW: 13.6 % (ref 11.5–15.5)
WBC: 12.4 10*3/uL — AB (ref 4.0–10.5)

## 2015-04-19 LAB — I-STAT TROPONIN, ED: Troponin i, poc: 0.01 ng/mL (ref 0.00–0.08)

## 2015-04-19 MED ORDER — IOHEXOL 300 MG/ML  SOLN
80.0000 mL | Freq: Once | INTRAMUSCULAR | Status: AC | PRN
  Administered 2015-04-19: 100 mL via INTRAVENOUS

## 2015-04-19 MED ORDER — ALBUTEROL SULFATE HFA 108 (90 BASE) MCG/ACT IN AERS
2.0000 | INHALATION_SPRAY | Freq: Four times a day (QID) | RESPIRATORY_TRACT | Status: DC | PRN
  Filled 2015-04-19: qty 6.7

## 2015-04-19 MED ORDER — LEVOTHYROXINE SODIUM 50 MCG PO TABS
50.0000 ug | ORAL_TABLET | Freq: Every day | ORAL | Status: DC
Start: 2015-04-20 — End: 2015-04-25
  Administered 2015-04-20 – 2015-04-25 (×6): 50 ug via ORAL
  Filled 2015-04-19 (×7): qty 1

## 2015-04-19 MED ORDER — FENTANYL CITRATE (PF) 100 MCG/2ML IJ SOLN
50.0000 ug | Freq: Once | INTRAMUSCULAR | Status: AC
  Administered 2015-04-19: 50 ug via INTRAVENOUS
  Filled 2015-04-19: qty 2

## 2015-04-19 MED ORDER — FLUTICASONE PROPIONATE HFA 110 MCG/ACT IN AERO
2.0000 | INHALATION_SPRAY | Freq: Two times a day (BID) | RESPIRATORY_TRACT | Status: DC
  Administered 2015-04-19: 2 via RESPIRATORY_TRACT

## 2015-04-19 MED ORDER — DONEPEZIL HCL 5 MG PO TABS
5.0000 mg | ORAL_TABLET | Freq: Every day | ORAL | Status: DC
  Administered 2015-04-20 – 2015-04-24 (×6): 5 mg via ORAL
  Filled 2015-04-19 (×7): qty 1

## 2015-04-19 MED ORDER — FUROSEMIDE 40 MG PO TABS
40.0000 mg | ORAL_TABLET | Freq: Every day | ORAL | Status: DC
  Administered 2015-04-20 – 2015-04-25 (×6): 40 mg via ORAL
  Filled 2015-04-19 (×6): qty 1

## 2015-04-19 MED ORDER — LOSARTAN POTASSIUM 50 MG PO TABS
50.0000 mg | ORAL_TABLET | Freq: Every day | ORAL | Status: DC
  Administered 2015-04-20 – 2015-04-25 (×6): 50 mg via ORAL
  Filled 2015-04-19 (×6): qty 1

## 2015-04-19 MED ORDER — PROPRANOLOL HCL ER 60 MG PO CP24
60.0000 mg | ORAL_CAPSULE | Freq: Every day | ORAL | Status: DC
  Administered 2015-04-20 – 2015-04-25 (×6): 60 mg via ORAL
  Filled 2015-04-19 (×6): qty 1

## 2015-04-19 MED ORDER — FENTANYL CITRATE (PF) 100 MCG/2ML IJ SOLN
50.0000 ug | INTRAMUSCULAR | Status: DC | PRN
  Administered 2015-04-20: 50 ug via INTRAVENOUS
  Filled 2015-04-19: qty 2
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
## 2015-04-19 NOTE — ED Notes (Signed)
Patient transported to X-ray 
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
## 2015-04-19 NOTE — ED Notes (Signed)
Pt returned from xray
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
## 2015-04-19 NOTE — ED Notes (Signed)
Pt in radiology 

## 2015-04-20 DIAGNOSIS — S3991XA Unspecified injury of abdomen, initial encounter: Secondary | ICD-10-CM | POA: Diagnosis not present

## 2015-04-20 DIAGNOSIS — R1010 Upper abdominal pain, unspecified: Secondary | ICD-10-CM | POA: Diagnosis not present

## 2015-04-20 DIAGNOSIS — S2220XA Unspecified fracture of sternum, initial encounter for closed fracture: Secondary | ICD-10-CM | POA: Diagnosis present

## 2015-04-20 DIAGNOSIS — S2222XA Fracture of body of sternum, initial encounter for closed fracture: Secondary | ICD-10-CM | POA: Diagnosis not present

## 2015-04-20 DIAGNOSIS — S3993XA Unspecified injury of pelvis, initial encounter: Secondary | ICD-10-CM | POA: Diagnosis not present

## 2015-04-20 LAB — BASIC METABOLIC PANEL
Anion gap: 11 (ref 5–15)
BUN: 16 mg/dL (ref 6–20)
CO2: 25 mmol/L (ref 22–32)
CREATININE: 1.1 mg/dL — AB (ref 0.44–1.00)
Calcium: 9.8 mg/dL (ref 8.9–10.3)
Chloride: 102 mmol/L (ref 101–111)
GFR, EST AFRICAN AMERICAN: 53 mL/min — AB (ref >60)
GFR, EST NON AFRICAN AMERICAN: 46 mL/min — AB (ref >60)
GLUCOSE: 131 mg/dL — AB (ref 65–99)
POTASSIUM: 3.4 mmol/L — AB (ref 3.5–5.1)
Sodium: 138 mmol/L (ref 135–145)

## 2015-04-20 LAB — CBC
HCT: 37.5 % (ref 36.0–46.0)
Hemoglobin: 12.5 g/dL (ref 12.0–15.0)
MCH: 32.2 pg (ref 26.0–34.0)
MCHC: 33.3 g/dL (ref 30.0–36.0)
MCV: 96.6 fL (ref 78.0–100.0)
PLATELETS: 179 10*3/uL (ref 150–400)
RBC: 3.88 MIL/uL (ref 3.87–5.11)
RDW: 13.6 % (ref 11.5–15.5)
WBC: 7.1 10*3/uL (ref 4.0–10.5)

## 2015-04-20 LAB — CYTOLOGY - PAP

## 2015-04-20 MED ORDER — ENOXAPARIN SODIUM 40 MG/0.4ML ~~LOC~~ SOLN
40.0000 mg | SUBCUTANEOUS | Status: DC
  Administered 2015-04-20 – 2015-04-25 (×6): 40 mg via SUBCUTANEOUS
  Filled 2015-04-20 (×6): qty 0.4

## 2015-04-20 MED ORDER — PANTOPRAZOLE SODIUM 40 MG IV SOLR
40.0000 mg | Freq: Every day | INTRAVENOUS | Status: DC
  Filled 2015-04-20: qty 40

## 2015-04-20 MED ORDER — FLUTICASONE PROPIONATE 50 MCG/ACT NA SUSP
2.0000 | Freq: Every day | NASAL | Status: DC | PRN
  Filled 2015-04-20: qty 16

## 2015-04-20 MED ORDER — TRAMADOL HCL 50 MG PO TABS
50.0000 mg | ORAL_TABLET | Freq: Four times a day (QID) | ORAL | Status: DC | PRN
  Administered 2015-04-20 – 2015-04-21 (×2): 100 mg via ORAL
  Filled 2015-04-20 (×2): qty 2

## 2015-04-20 MED ORDER — ONDANSETRON HCL 4 MG PO TABS
4.0000 mg | ORAL_TABLET | Freq: Four times a day (QID) | ORAL | Status: DC | PRN
  Administered 2015-04-22 – 2015-04-25 (×3): 4 mg via ORAL
  Filled 2015-04-20 (×3): qty 1

## 2015-04-20 MED ORDER — MORPHINE SULFATE 2 MG/ML IJ SOLN: 2.0000 mg | INTRAMUSCULAR | Status: DC | PRN

## 2015-04-20 MED ORDER — POLYETHYLENE GLYCOL 3350 17 G PO PACK
17.0000 g | PACK | Freq: Every day | ORAL | Status: DC
  Administered 2015-04-20 – 2015-04-25 (×6): 17 g via ORAL
  Filled 2015-04-20 (×6): qty 1

## 2015-04-20 MED ORDER — BUDESONIDE 0.5 MG/2ML IN SUSP
1.0000 mg | Freq: Two times a day (BID) | RESPIRATORY_TRACT | Status: DC
  Administered 2015-04-20 – 2015-04-24 (×7): 1 mg via RESPIRATORY_TRACT
  Administered 2015-04-25: 0.5 mg via RESPIRATORY_TRACT
  Filled 2015-04-20 (×17): qty 4

## 2015-04-20 MED ORDER — GUAIFENESIN ER 600 MG PO TB12
1200.0000 mg | ORAL_TABLET | Freq: Two times a day (BID) | ORAL | Status: DC
  Administered 2015-04-20 – 2015-04-25 (×11): 1200 mg via ORAL
  Filled 2015-04-20 (×12): qty 2

## 2015-04-20 MED ORDER — AZELASTINE HCL 0.1 % NA SOLN
1.0000 | Freq: Every day | NASAL | Status: DC | PRN
  Filled 2015-04-20: qty 30

## 2015-04-20 MED ORDER — ROPINIROLE HCL 1 MG PO TABS
1.0000 mg | ORAL_TABLET | Freq: Every day | ORAL | Status: DC
  Administered 2015-04-20 – 2015-04-24 (×5): 1 mg via ORAL
  Filled 2015-04-20 (×6): qty 1

## 2015-04-20 MED ORDER — FAMOTIDINE 20 MG PO TABS
20.0000 mg | ORAL_TABLET | Freq: Two times a day (BID) | ORAL | Status: DC
  Administered 2015-04-20 – 2015-04-25 (×10): 20 mg via ORAL
  Filled 2015-04-20 (×13): qty 1

## 2015-04-20 MED ORDER — OXYCODONE HCL 5 MG PO TABS: 5.0000 mg | ORAL_TABLET | ORAL | Status: DC | PRN

## 2015-04-20 MED ORDER — ONDANSETRON HCL 4 MG/2ML IJ SOLN
4.0000 mg | Freq: Four times a day (QID) | INTRAMUSCULAR | Status: DC | PRN
  Administered 2015-04-20 – 2015-04-21 (×3): 4 mg via INTRAVENOUS
  Filled 2015-04-20 (×3): qty 2

## 2015-04-20 MED ORDER — MORPHINE SULFATE 2 MG/ML IJ SOLN
2.0000 mg | INTRAMUSCULAR | Status: DC | PRN
  Administered 2015-04-20 – 2015-04-21 (×2): 2 mg via INTRAVENOUS
  Filled 2015-04-20 (×2): qty 1

## 2015-04-20 MED ORDER — VITAMIN D3 25 MCG (1000 UNIT) PO TABS
2000.0000 [IU] | ORAL_TABLET | Freq: Every day | ORAL | Status: DC
  Administered 2015-04-20 – 2015-04-25 (×6): 2000 [IU] via ORAL
  Filled 2015-04-20 (×6): qty 2

## 2015-04-20 MED ORDER — ACETAMINOPHEN 325 MG PO TABS: 650.0000 mg | ORAL_TABLET | ORAL | Status: DC | PRN

## 2015-04-20 MED ORDER — CYCLOSPORINE 0.05 % OP EMUL
1.0000 [drp] | Freq: Two times a day (BID) | OPHTHALMIC | Status: DC
  Administered 2015-04-20 – 2015-04-25 (×9): 1 [drp] via OPHTHALMIC
  Filled 2015-04-20 (×13): qty 1

## 2015-04-20 MED ORDER — ASPIRIN 81 MG PO CHEW
81.0000 mg | CHEWABLE_TABLET | Freq: Every day | ORAL | Status: DC
Start: 2015-04-20 — End: 2015-04-25
  Administered 2015-04-20 – 2015-04-25 (×6): 81 mg via ORAL
  Filled 2015-04-20 (×10): qty 1

## 2015-04-20 MED ORDER — PANTOPRAZOLE SODIUM 40 MG PO TBEC
40.0000 mg | DELAYED_RELEASE_TABLET | Freq: Every day | ORAL | Status: DC
Start: 2015-04-20 — End: 2015-04-20
  Administered 2015-04-20: 40 mg via ORAL
  Filled 2015-04-20: qty 1

## 2015-04-20 MED ORDER — TEMAZEPAM 7.5 MG PO CAPS: 7.5000 mg | ORAL_CAPSULE | Freq: Every evening | ORAL | Status: DC | PRN

## 2015-04-20 MED ORDER — BUDESONIDE 0.5 MG/2ML IN SUSP
1.0000 mg | Freq: Two times a day (BID) | RESPIRATORY_TRACT | Status: DC
  Filled 2015-04-20 (×3): qty 4

## 2015-04-20 MED ORDER — SODIUM CHLORIDE 0.9 % IV SOLN
INTRAVENOUS | Status: DC
Start: 2015-04-20 — End: 2015-04-20
  Administered 2015-04-20: 10:00:00 via INTRAVENOUS

## 2015-04-20 MED ORDER — LORAZEPAM 0.5 MG PO TABS: 0.5000 mg | ORAL_TABLET | Freq: Two times a day (BID) | ORAL | Status: DC | PRN

## 2015-04-20 MED ORDER — FEBUXOSTAT 40 MG PO TABS
40.0000 mg | ORAL_TABLET | Freq: Every day | ORAL | Status: DC
  Administered 2015-04-20 – 2015-04-25 (×6): 40 mg via ORAL
  Filled 2015-04-20 (×6): qty 1

## 2015-04-20 MED ORDER — OXYCODONE HCL 5 MG PO TABS
10.0000 mg | ORAL_TABLET | ORAL | Status: DC | PRN
  Administered 2015-04-20: 10 mg via ORAL
  Filled 2015-04-20: qty 2

## 2015-04-20 MED ORDER — ALBUTEROL SULFATE (2.5 MG/3ML) 0.083% IN NEBU: 3.0000 mL | INHALATION_SOLUTION | Freq: Four times a day (QID) | RESPIRATORY_TRACT | Status: DC | PRN
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
## 2015-04-20 NOTE — Progress Notes (Signed)
E.D. contacted for report--line busy.
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
## 2015-04-21 DIAGNOSIS — S2222XA Fracture of body of sternum, initial encounter for closed fracture: Secondary | ICD-10-CM | POA: Diagnosis not present

## 2015-04-21 DIAGNOSIS — S2220XA Unspecified fracture of sternum, initial encounter for closed fracture: Secondary | ICD-10-CM | POA: Diagnosis not present

## 2015-04-21 LAB — GLUCOSE, CAPILLARY: Glucose-Capillary: 132 mg/dL — ABNORMAL HIGH (ref 65–99)

## 2015-04-21 MED ORDER — BUDESONIDE 0.5 MG/2ML IN SUSP
1.0000 mg | Freq: Two times a day (BID) | RESPIRATORY_TRACT | Status: DC
  Administered 2015-04-21 (×2): 1 mg via RESPIRATORY_TRACT
  Filled 2015-04-21 (×5): qty 4

## 2015-04-21 MED ORDER — HYDROCODONE-ACETAMINOPHEN 10-325 MG PO TABS
0.5000 | ORAL_TABLET | ORAL | Status: DC | PRN
  Administered 2015-04-21 – 2015-04-22 (×2): 1 via ORAL
  Filled 2015-04-21 (×2): qty 1

## 2015-04-21 MED ORDER — PROMETHAZINE HCL 25 MG/ML IJ SOLN
12.5000 mg | INTRAMUSCULAR | Status: DC | PRN
  Administered 2015-04-22: 12.5 mg via INTRAVENOUS
  Filled 2015-04-21 (×2): qty 1
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
## 2015-04-22 DIAGNOSIS — S2222XA Fracture of body of sternum, initial encounter for closed fracture: Secondary | ICD-10-CM | POA: Diagnosis not present

## 2015-04-22 DIAGNOSIS — S2220XA Unspecified fracture of sternum, initial encounter for closed fracture: Secondary | ICD-10-CM | POA: Diagnosis not present

## 2015-04-22 MED ORDER — OXYCODONE HCL 5 MG PO TABS
2.5000 mg | ORAL_TABLET | ORAL | Status: DC | PRN
Start: 2015-04-22 — End: 2015-04-25
  Administered 2015-04-22: 7.5 mg via ORAL
  Administered 2015-04-22 – 2015-04-25 (×9): 5 mg via ORAL
  Filled 2015-04-22 (×9): qty 1
  Filled 2015-04-22: qty 2
  Filled 2015-04-22: qty 1

## 2015-04-22 NOTE — Progress Notes (Signed)
Trauma Service Note  Subjective: Patient in bed, having a lot of pain.  Objective: Vital signs in last 24 hours: Temp:  [97.8 F (36.6 C)-98.3 F (36.8 C)] 98.3 F (36.8 C) (06/10 0505) Pulse Rate:  [61-70] 68 (06/10 0505) Resp:  [18] 18 (06/10 0505) BP: (106-130)/(45-92) 114/54 mmHg (06/10 0505) SpO2:  [98 %-100 %] 98 % (06/10 0505)    Intake/Output from previous day: 06/09 0701 - 06/10 0700 In: 462 [P.O.:462] Out: 1200 [Urine:1200] Intake/Output this shift:    General: Moderate acute distress.  Pain in chest and legs  Lungs: Clear with good expansion.   Abd: Benign.  Tolerating a regular diet.  Extremities: No changes.  Bruising all over.  Neuro: Intact  Lab Results: CBC   Recent Labs  04/19/15 1958 04/20/15 1010  WBC 12.4* 7.1  HGB 12.5 12.5  HCT 38.3 37.5  PLT 206 179   BMET  Recent Labs  04/19/15 1958 04/20/15 1010  NA 141 138  K 3.5 3.4*  CL 104 102  CO2 28 25  GLUCOSE 93 131*  BUN 19 16  CREATININE 1.25* 1.10*  CALCIUM 9.9 9.8   PT/INR No results for input(s): LABPROT, INR in the last 72 hours. ABG No results for input(s): PHART, HCO3 in the last 72 hours.  Invalid input(s): PCO2, PO2  Studies/Results: No results found.  Anti-infectives: Anti-infectives    None      Assessment/Plan: s/p  Advance diet Await further work with PT/OT.  If patient has to go to SNF, she prefers [facility].    Kathryne Eriksson. Dahlia Bailiff, MD, FACS [phone] Trauma Surgeon 04/22/2015

## 2015-04-22 NOTE — Clinical Social Work Note (Signed)
Clinical Social Worker received notification from CM that patient is now requiring SNF placement and continues to remain in observation status.  CSW spoke with Medical Director, who states that the hospital system will approve a 7 day LOG for SNF placement.  CSW contacted [facility] who requests referral to be sent and decision to be made by tomorrow (06/11).  ED CSW also contacted [facility] who has received referral and will accept patient if bed becomes available.  CSW and PA spoke with patient about potential placement - patient currently in agreement with [name] and is hopeful that she may progress physically over the weekend to return home.  CSW updated covering CSW for the weekend who remains available for support and will facilitate patient discharge needs.  Barbette Or, [place]

## 2015-04-22 NOTE — Clinical Social Work Placement (Signed)
   CLINICAL SOCIAL WORK PLACEMENT  NOTE  Date:  04/22/2015  Patient Details  Name: Christina Mckinney MRN: [id number] Date of Birth: 11/14/1931  Clinical Social Work is seeking post-discharge placement for this patient at the [place] level of care (*CSW will initial, date and re-position this form in  chart as items are completed):  Yes   Patient/family provided with [place] Work Department's list of facilities offering this level of care within the geographic area requested by the patient (or if unable, by the patient's family).  Yes   Patient/family informed of their freedom to choose among providers that offer the needed level of care, that participate in Medicare, Medicaid or managed care program needed by the patient, have an available bed and are willing to accept the patient.  Yes   Patient/family informed of [facility]'s ownership interest in [facility] and [facility], as well as of the fact that they are under no obligation to receive care at these facilities.  PASRR submitted to EDS on       PASRR number received on       Existing PASRR number confirmed on 04/22/15     FL2 transmitted to all facilities in geographic area requested by pt/family on 04/22/15     FL2 transmitted to all facilities within larger geographic area on       Patient informed that his/her managed care company has contracts with or will negotiate with certain facilities, including the following:            Patient/family informed of bed offers received.  Patient chooses bed at       Physician recommends and patient chooses bed at      Patient to be transferred to   on  .  Patient to be transferred to facility by       Patient family notified on   of transfer.  Name of family member notified:        PHYSICIAN Please prepare prescriptions, Please sign FL2     Additional Comment:  PATIENT IN OBSERVATION STATUS. LOG FROM THE HOSPITAL PER MD  ARONSON.  _______________________________________________ Lilly Cove, LCSW 04/22/2015, 3:57 PM
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
## 2015-04-22 NOTE — Progress Notes (Signed)
Unfortunately, pt is Medicare and is in OBS status, therefore will not qualify for SNF stay.  Will see pt to discuss her ability to private pay vs home with HHC/ability to secure additional supervision.

## 2015-04-22 NOTE — Progress Notes (Signed)
LCSW has been made aware that patient is currently in need of placement at [facility] due to family working and unable to be with her during the day, but has family for night supervision.  Patient is requiring an LOG for placement and has been faxed out, but no bed offers [facility] has been called and made aware of patient, but they have no beds per [facility] with no planned weekend discharges.  LCSW to call Sharyn Lull on Saturday AM (weekend coverage for [facility] to see if patient can be admitted over the weekend, then will call MD Reynaldo Minium on Saturday morning with status update.    Weekend number:  [phone].  This Probation officer will be here on Saturday and will follow up. FL2 completed [place] Completed.  MD to sign FL2.  Lane Hacker, MSW Clinical Social Work: Emergency Room [phone]
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
## 2015-04-22 NOTE — Progress Notes (Signed)
SBIRT completed. Pt admits to having a glass of wine "every once in a while." No SA f/u recommended.

## 2015-04-22 NOTE — Clinical Social Work Note (Signed)
Clinical Social Work Assessment  Patient Details  Name: Christina Mckinney MRN: [id number] Date of Birth: 10-Jul-1932  Date of referral:  04/22/15               Reason for consult:  Facility Placement, Rule-out Psychosocial                Permission sought to share information with:    Permission granted to share information::     Name::        Agency::     Relationship::     Contact Information:     Housing/Transportation Living arrangements for the past 2 months:  Single Family Home Source of Information:  Patient Patient Interpreter Needed:  None Criminal Activity/Legal Involvement Pertinent to Current Situation/Hospitalization:  No - Comment as needed Significant Relationships:  Adult Children, Siblings Lives with:    Do you feel safe going back to the place where you live?  No Need for family participation in patient care:  Yes (Comment)  Care giving concerns: Pt is adamant that she cannot return home at d/c because she lives alone and her family cannot help her.  Pt states that she is in too much pain to d/c and would not be able to function on her own.  ? Insurance barriers discussed.  Pt stated that she knows her rights, did not care about Medicare guidelines, and is insistent that her Tricare insurance will pay for SNF.  Pt unable to private pay for SNF or home care.  CSW assured pt that I would speak with RNCM re: her status and ? SNF benefits through [employer].  Encouraged pt to calm down, as she was c/o of severe pain.  Emotional support offered.  Social Worker assessment / plan: Pt now IP. CSW will pursue SNF per pt's wishes, even though she may not have qualifying stay based on her d/c date.  She prefers [facility] in [facility], as it is near her home.  CSW will continue to follow. Employment status:  Retired Forensic scientist:  Medicare PT Recommendations:  Home with [place] / Referral to community resources:  SBIRT  Patient/Family's Response to care:  Pt adamant  about not returning home alone at d/c.  Insists on going to SNF for rehab.  Pt points out that she knows her rights and will stand up for herself.  Pt alleges that she was not admitted as an IP because of her age.    Patient/Family's Understanding of and Emotional Response to Diagnosis, Current Treatment, and Prognosis: Pt c/o of severe pain.  Does not understand how she can be expected to return home alone in her condition.  Anxious to have her pain issues addressed. Emotional Assessment Appearance:  Appears younger than stated age Attitude/Demeanor/Rapport:  Angry, Complaining Affect (typically observed):  Appropriate, Restless, Defensive Orientation:   x4 Alcohol / Substance use:  Not Applicable Psych involvement (Current and /or in the community):  No (Comment)  Discharge Needs  Concerns to be addressed:   ? SNF placement Readmission within the last 30 days:  No Current discharge risk:  Lack of support system, Lives alone, Dependent with Mobility Barriers to Discharge:   (pt admitted as OBS (3 days) changed to IP 6/10)   Kaislyn Gulas M, LCSW 04/22/2015, 11:19 AM

## 2015-04-23 DIAGNOSIS — S2222XA Fracture of body of sternum, initial encounter for closed fracture: Secondary | ICD-10-CM | POA: Diagnosis not present

## 2015-04-23 DIAGNOSIS — S2220XA Unspecified fracture of sternum, initial encounter for closed fracture: Secondary | ICD-10-CM | POA: Diagnosis not present

## 2015-04-23 MED ORDER — OXYCODONE HCL 5 MG PO TABS: 2.5000 mg | ORAL_TABLET | ORAL | Status: DC | PRN

## 2015-04-23 MED ORDER — GUAIFENESIN ER 600 MG PO TB12: 1200.0000 mg | ORAL_TABLET | Freq: Two times a day (BID) | ORAL | Status: DC

## 2015-04-23 MED ORDER — BISACODYL 10 MG RE SUPP
10.0000 mg | Freq: Every day | RECTAL | Status: DC | PRN
  Administered 2015-04-23: 10 mg via RECTAL
  Filled 2015-04-23: qty 1

## 2015-04-23 NOTE — Progress Notes (Signed)
CSW spoke with Dr. Reynaldo Minium concerning d/c planning Pt to SNF. Pt is medically ready for d/c and Pt is now electing to change from going home to accepting a bed from two prior bed offers at [facility] or [facility] or Rehab.  Dr. Reynaldo Minium stated that Pt should d/c to [facility] if bed offer was still available.   CSW contacted Fransisco Beau at [facility] to see if bed offer was available. Per coordinator Pt can not be accepted until Monday. Pt is an observation Pt and is medically ready for d/c.   CSW contacted Pt nurse and updated RN of d/c plan to home due to lack of SNF placement options.  CSW contacted [place] CM concerning d/c planning. Mariann Laster stated that Clarise Cruz the CM was on her way to the unit to speak with the Pt.   CSW signing off.     [facility]  2S, 44M,3S, 5N, 6N [phone]
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
## 2015-04-23 NOTE — Progress Notes (Addendum)
1435  LCSW received call from CM and [facility]. Patient is personal friends with administration at [facility] per report and patient reports someone will be picking her up. Call placed to [facility] who is not on the way to pick her up, aware of patient's LOG and needs and working with director before accepting patient. Awaiting return call from [facility] with regards to placement in SNF.   Patient refusing HH, other options for SNF.  [facility] cannot offer bed as they had told family they could due to LOG status and no qualifying stay. [facility] has communicated this with the family and they are aware. CM is aware, calling family to explain and patient will DC.   LCSW was called by Sharyn Lull at [facility] that she does not have a bed open today that family called and held the bed. Patient at this time does not have any bed offers and only other potential bed is at [facility] in which patient adamently refused.  Patient is ready for DC, summary is available. Reports she cannot go home and asking about CIR. LCSW explained patient is not a candidate at this time.   Patient adamantly refusing to go to [place] or [place].  Call placed to MD Reynaldo Minium with regards to options. Per Reynaldo Minium, patient will either DC home as she is ready for DC or she will go to [place]. If she chooses to go home she can go home with home health.  CM to be called to to arrange [facility].  Lane Hacker, MSW Clinical Social Work: Emergency Room [phone]
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
## 2015-04-24 DIAGNOSIS — S2222XA Fracture of body of sternum, initial encounter for closed fracture: Secondary | ICD-10-CM | POA: Diagnosis not present

## 2015-04-24 DIAGNOSIS — S2220XA Unspecified fracture of sternum, initial encounter for closed fracture: Secondary | ICD-10-CM | POA: Diagnosis not present

## 2015-04-24 NOTE — Progress Notes (Signed)
Several conversations with social work, care management, and [facility] today regarding patient and DC plans, will discharge to Skilled facility when bed is available. Lizette Pazos, Bettina Gavia RN
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
## 2015-04-24 NOTE — Progress Notes (Signed)
CM met with pt and gave her ABN (Advance Beneficiary Notice of Noncoverage); copy placed in shadow chart and other copy in CM file.  NO other CM needs were communicated.
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
## 2015-04-24 NOTE — Progress Notes (Signed)
04/23/2015 1900 After many discussions with CSW and Case Management pt. still refuses to go home, refused [facility] services and placement in SNF for rehab in facilities CSW had arranged.  Pt does not feel like she should be discharged,  She states she can not go home without 24hour care and her family is unable to be there 24-7.  Call made to [facility] who was willing to come speak with patient.  AC expressed with nurse and charge nurse that the pt. Is not safe to go home by herself and will stay in hospital tonight.   Carney Corners

## 2015-04-25 ENCOUNTER — Other Ambulatory Visit: Payer: Self-pay | Admitting: Internal Medicine

## 2015-04-25 DIAGNOSIS — S2220XD Unspecified fracture of sternum, subsequent encounter for fracture with routine healing: Secondary | ICD-10-CM | POA: Diagnosis not present

## 2015-04-25 DIAGNOSIS — R262 Difficulty in walking, not elsewhere classified: Secondary | ICD-10-CM | POA: Diagnosis not present

## 2015-04-25 DIAGNOSIS — S2220XA Unspecified fracture of sternum, initial encounter for closed fracture: Secondary | ICD-10-CM | POA: Diagnosis not present

## 2015-04-25 DIAGNOSIS — S2222XA Fracture of body of sternum, initial encounter for closed fracture: Secondary | ICD-10-CM | POA: Diagnosis not present

## 2015-04-25 DIAGNOSIS — M6281 Muscle weakness (generalized): Secondary | ICD-10-CM | POA: Diagnosis not present

## 2015-04-25 DIAGNOSIS — R41841 Cognitive communication deficit: Secondary | ICD-10-CM | POA: Diagnosis not present

## 2015-04-25 NOTE — Progress Notes (Signed)
PT Cancellation Note  Patient Details Name: Christina Mckinney MRN: [id number] DOB: May 23, 1932   Cancelled Treatment:    Reason Eval/Treat Not Completed: Patient declined, stating, "I'm leaving today for rehab."  States family member will take her rather than ambulance.  Nursing informed.   Theodoro Koval LUBECK 04/25/2015, 11:12 AM
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
## 2015-04-25 NOTE — Clinical Social Work Placement (Signed)
   CLINICAL SOCIAL WORK PLACEMENT  NOTE  Date:  04/25/2015  Patient Details  Name: DARIEL PELLECCHIA MRN: [id number] Date of Birth: 1932/08/20  Clinical Social Work is seeking post-discharge placement for this patient at the [place] level of care (*CSW will initial, date and re-position this form in  chart as items are completed):  Yes   Patient/family provided with [place] Work Department's list of facilities offering this level of care within the geographic area requested by the patient (or if unable, by the patient's family).  Yes   Patient/family informed of their freedom to choose among providers that offer the needed level of care, that participate in Medicare, Medicaid or managed care program needed by the patient, have an available bed and are willing to accept the patient.  Yes   Patient/family informed of [place]'s ownership interest in [facility] and [facility], as well as of the fact that they are under no obligation to receive care at these facilities.  PASRR submitted to EDS on       PASRR number received on       Existing PASRR number confirmed on 04/22/15     FL2 transmitted to all facilities in geographic area requested by pt/family on 04/22/15     FL2 transmitted to all facilities within larger geographic area on       Patient informed that his/her managed care company has contracts with or will negotiate with certain facilities, including the following:        Yes   Patient/family informed of bed offers received.  Patient chooses bed at [facility] recommends and patient chooses bed at      Patient to be transferred to [facility] on 04/25/15.  Patient to be transferred to facility by [facility]     Patient family notified on 04/25/15 of transfer.  Name of family member notified:  Patient to notify family per her request     PHYSICIAN Please prepare prescriptions,  Please sign FL2     Additional Comment:    Barbette Or, [place]
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
## 2015-04-25 NOTE — Progress Notes (Signed)
04/25/2015  5:59 PM   D/C IV and TELE.  Questions and concerns addressed.   D/C to [facility] per orders via private vehicle with son. Carney Corners

## 2015-04-25 NOTE — Clinical Social Work Note (Signed)
Clinical Social Worker received notification this afternoon that patient was accepted to [place] with a 7 day LOG.  Patient has hesitantly accepted bed offer.  CSW facilitated patient discharge including contacting patient and facility to confirm patient discharge plans.  Clinical information faxed to facility and patient agreeable with plan.  Patient arranged transport via family car at time of discharge.  RN to call report prior to discharge.  Clinical Social Worker will sign off for now as social work intervention is no longer needed. Please consult [place] again if new need arises.  Barbette Or, [place]

## 2015-04-26 ENCOUNTER — Other Ambulatory Visit: Payer: Self-pay | Admitting: Internal Medicine

## 2015-04-26 DIAGNOSIS — S2222XD Fracture of body of sternum, subsequent encounter for fracture with routine healing: Secondary | ICD-10-CM | POA: Diagnosis not present

## 2015-04-26 DIAGNOSIS — M6281 Muscle weakness (generalized): Secondary | ICD-10-CM | POA: Diagnosis not present

## 2015-04-26 DIAGNOSIS — R262 Difficulty in walking, not elsewhere classified: Secondary | ICD-10-CM | POA: Diagnosis not present

## 2015-04-26 DIAGNOSIS — R41841 Cognitive communication deficit: Secondary | ICD-10-CM | POA: Diagnosis not present

## 2015-04-26 DIAGNOSIS — J449 Chronic obstructive pulmonary disease, unspecified: Secondary | ICD-10-CM | POA: Diagnosis not present

## 2015-04-26 DIAGNOSIS — R7309 Other abnormal glucose: Secondary | ICD-10-CM | POA: Diagnosis not present

## 2015-04-26 DIAGNOSIS — E039 Hypothyroidism, unspecified: Secondary | ICD-10-CM | POA: Diagnosis not present

## 2015-04-26 DIAGNOSIS — S2220XD Unspecified fracture of sternum, subsequent encounter for fracture with routine healing: Secondary | ICD-10-CM | POA: Diagnosis not present

## 2015-04-27 DIAGNOSIS — S2220XD Unspecified fracture of sternum, subsequent encounter for fracture with routine healing: Secondary | ICD-10-CM | POA: Diagnosis not present

## 2015-04-27 DIAGNOSIS — R41841 Cognitive communication deficit: Secondary | ICD-10-CM | POA: Diagnosis not present

## 2015-04-27 DIAGNOSIS — M6281 Muscle weakness (generalized): Secondary | ICD-10-CM | POA: Diagnosis not present

## 2015-04-27 DIAGNOSIS — R262 Difficulty in walking, not elsewhere classified: Secondary | ICD-10-CM | POA: Diagnosis not present

## 2015-04-28 DIAGNOSIS — E039 Hypothyroidism, unspecified: Secondary | ICD-10-CM | POA: Diagnosis not present

## 2015-04-28 DIAGNOSIS — R262 Difficulty in walking, not elsewhere classified: Secondary | ICD-10-CM | POA: Diagnosis not present

## 2015-04-28 DIAGNOSIS — R41841 Cognitive communication deficit: Secondary | ICD-10-CM | POA: Diagnosis not present

## 2015-04-28 DIAGNOSIS — I10 Essential (primary) hypertension: Secondary | ICD-10-CM | POA: Diagnosis not present

## 2015-04-28 DIAGNOSIS — Z79899 Other long term (current) drug therapy: Secondary | ICD-10-CM | POA: Diagnosis not present

## 2015-04-28 DIAGNOSIS — R7309 Other abnormal glucose: Secondary | ICD-10-CM | POA: Diagnosis not present

## 2015-04-28 DIAGNOSIS — Z86718 Personal history of other venous thrombosis and embolism: Secondary | ICD-10-CM | POA: Diagnosis not present

## 2015-04-28 DIAGNOSIS — M6281 Muscle weakness (generalized): Secondary | ICD-10-CM | POA: Diagnosis not present

## 2015-04-28 DIAGNOSIS — S2220XD Unspecified fracture of sternum, subsequent encounter for fracture with routine healing: Secondary | ICD-10-CM | POA: Diagnosis not present

## 2015-04-29 DIAGNOSIS — S2222XD Fracture of body of sternum, subsequent encounter for fracture with routine healing: Secondary | ICD-10-CM | POA: Diagnosis not present

## 2015-04-29 DIAGNOSIS — R41841 Cognitive communication deficit: Secondary | ICD-10-CM | POA: Diagnosis not present

## 2015-04-29 DIAGNOSIS — J449 Chronic obstructive pulmonary disease, unspecified: Secondary | ICD-10-CM | POA: Diagnosis not present

## 2015-04-29 DIAGNOSIS — S2220XD Unspecified fracture of sternum, subsequent encounter for fracture with routine healing: Secondary | ICD-10-CM | POA: Diagnosis not present

## 2015-04-29 DIAGNOSIS — K5900 Constipation, unspecified: Secondary | ICD-10-CM | POA: Diagnosis not present

## 2015-04-29 DIAGNOSIS — R262 Difficulty in walking, not elsewhere classified: Secondary | ICD-10-CM | POA: Diagnosis not present

## 2015-04-29 DIAGNOSIS — M6281 Muscle weakness (generalized): Secondary | ICD-10-CM | POA: Diagnosis not present

## 2015-04-29 DIAGNOSIS — E039 Hypothyroidism, unspecified: Secondary | ICD-10-CM | POA: Diagnosis not present

## 2015-05-10 ENCOUNTER — Encounter: Payer: Self-pay | Admitting: Internal Medicine

## 2015-05-10 ENCOUNTER — Ambulatory Visit (INDEPENDENT_AMBULATORY_CARE_PROVIDER_SITE_OTHER): Payer: Medicare Other | Admitting: Internal Medicine

## 2015-05-10 VITALS — BP 150/80 | HR 83 | Temp 98.0°F | Resp 20 | Ht 61.0 in | Wt 157.0 lb

## 2015-05-10 DIAGNOSIS — M150 Primary generalized (osteo)arthritis: Secondary | ICD-10-CM | POA: Diagnosis not present

## 2015-05-10 DIAGNOSIS — I10 Essential (primary) hypertension: Secondary | ICD-10-CM

## 2015-05-10 DIAGNOSIS — G629 Polyneuropathy, unspecified: Secondary | ICD-10-CM | POA: Diagnosis not present

## 2015-05-10 DIAGNOSIS — E119 Type 2 diabetes mellitus without complications: Secondary | ICD-10-CM

## 2015-05-10 DIAGNOSIS — M159 Polyosteoarthritis, unspecified: Secondary | ICD-10-CM

## 2015-05-10 MED ORDER — PROPRANOLOL HCL ER 60 MG PO CP24: 60.0000 mg | ORAL_CAPSULE | Freq: Every day | ORAL | Status: DC

## 2015-05-10 NOTE — Patient Instructions (Signed)
You  may move around, but avoid painful motions and activities.    Call or return to clinic prn if these symptoms worsen or fail to improve as anticipated.   
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
## 2015-05-10 NOTE — Progress Notes (Signed)
Pre visit review using our clinic review tool, if applicable. No additional management support is needed unless otherwise documented below in the visit note. 

## 2015-06-02 ENCOUNTER — Other Ambulatory Visit: Payer: Self-pay | Admitting: Obstetrics and Gynecology

## 2015-06-02 DIAGNOSIS — N63 Unspecified lump in unspecified breast: Secondary | ICD-10-CM

## 2015-06-08 ENCOUNTER — Encounter: Payer: Self-pay | Admitting: Internal Medicine

## 2015-06-08 ENCOUNTER — Ambulatory Visit (INDEPENDENT_AMBULATORY_CARE_PROVIDER_SITE_OTHER): Payer: Medicare Other | Admitting: Internal Medicine

## 2015-06-08 ENCOUNTER — Ambulatory Visit
Admission: RE | Admit: 2015-06-08 | Discharge: 2015-06-08 | Disposition: A | Discharge status: Home / Self Care | Payer: Medicare Other | Source: Ambulatory Visit | Attending: Obstetrics and Gynecology | Admitting: Obstetrics and Gynecology

## 2015-06-08 VITALS — BP 136/82 | HR 83 | Temp 98.4°F | Resp 16 | Ht 61.0 in

## 2015-06-08 DIAGNOSIS — F430 Acute stress reaction: Secondary | ICD-10-CM | POA: Diagnosis not present

## 2015-06-08 DIAGNOSIS — R0789 Other chest pain: Secondary | ICD-10-CM

## 2015-06-08 DIAGNOSIS — R4589 Other symptoms and signs involving emotional state: Secondary | ICD-10-CM

## 2015-06-08 DIAGNOSIS — G629 Polyneuropathy, unspecified: Secondary | ICD-10-CM

## 2015-06-08 DIAGNOSIS — N63 Unspecified lump in unspecified breast: Secondary | ICD-10-CM

## 2015-06-08 DIAGNOSIS — R928 Other abnormal and inconclusive findings on diagnostic imaging of breast: Secondary | ICD-10-CM | POA: Diagnosis not present

## 2015-06-08 MED ORDER — TRAMADOL HCL 50 MG PO TABS: 50.0000 mg | ORAL_TABLET | Freq: Every day | ORAL | Status: DC | PRN

## 2015-06-08 MED ORDER — LORAZEPAM 0.5 MG PO TABS: 0.5000 mg | ORAL_TABLET | Freq: Two times a day (BID) | ORAL | Status: DC | PRN

## 2015-06-08 NOTE — Patient Instructions (Signed)
Our office will contact you re: nerve conduction study.  Use gabapentin at night for pain in your feet Do not take tramadol or other pain medications within 2 hrs of taking lorazepam
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
## 2015-06-14 ENCOUNTER — Ambulatory Visit (INDEPENDENT_AMBULATORY_CARE_PROVIDER_SITE_OTHER): Payer: Medicare Other | Admitting: Neurology

## 2015-06-14 DIAGNOSIS — G629 Polyneuropathy, unspecified: Secondary | ICD-10-CM | POA: Diagnosis not present

## 2015-06-14 NOTE — Procedures (Signed)
[facility]  [address], [address]  [place], [place] [address] Tel: [phone] Fax:  [phone] Test Date:  06/14/2015  Patient: Christina Mckinney DOB: January 09, 1932 Physician: Narda Amber, DO  Sex: Female Height: 5\' 1"  Ref Phys: Drema Pry D.O.  ID#: [id number] Temp: 33.1C Technician: Jerilynn Mages. Dean   Patient Complaints: This is a 81 year old female presenting for evaluation of bilateral numbness and tingling in feet.  NCV & EMG Findings: Extensive electrodiagnostic testing of the left lower extremity and additional studies of the right shows:  1. Bilateral sural and superficial peroneal sensory responses are absent. 2. Peroneal and tibial motor responses are within normal limits. 3. Chronic motor axon loss changes are seen affecting the muscles below the knees bilaterally. There is no evidence of active denervation.  Impression: 1. The electrophysiologic findings are most consistent with a generalized sensorimotor polyneuropathy, axon loss in type, affecting the lower extremities. 2. There is no evidence of a superimposed lumbosacral radiculopathy.   ___________________________ Narda Amber, DO    Nerve Conduction Studies Anti Sensory Summary Table   Site NR Peak (ms) Norm Peak (ms) P-T Amp (V) Norm P-T Amp  Left Sup Peroneal Anti Sensory (Ant Lat Mall)  33.1C  12 cm NR  <4.6  >3  Right Sup Peroneal Anti Sensory (Ant Lat Mall)  33.1C  12 cm NR  <4.6  >3  Left Sural Anti Sensory (Lat Mall)  33.1C  Calf NR  <4.6  >3  Right Sural Anti Sensory (Lat Mall)  33.1C  Calf NR  <4.6  >3   Motor Summary Table   Site NR Onset (ms) Norm Onset (ms) O-P Amp (mV) Norm O-P Amp Site1 Site2 Delta-0 (ms) Dist (cm) Vel (m/s) Norm Vel (m/s)  Left Peroneal Motor (Ext Dig Brev)  33.1C  Ankle    3.8 <6.0 3.5 >2.5 B Fib Ankle 5.3 25.0 47 >40  B Fib    9.1  3.2  Poplt B Fib 2.3 10.0 43 >40  Poplt    11.4  3.2         Right Peroneal Motor (Ext Dig Brev)  33.1C  Ankle    3.7 <6.0 4.4  >2.5 B Fib Ankle 5.8 29.0 50 >40  B Fib    9.5  4.2  Poplt B Fib 2.0 10.0 50 >40  Poplt    11.5  4.2         Left Tibial Motor (Abd Hall Brev)  33.1C  Ankle    3.9 <6.0 8.1 >4 Knee Ankle 7.7 36.0 47 >40  Knee    11.6  5.7         Right Tibial Motor (Abd Hall Brev)  33.1C  Ankle    3.3 <6.0 7.1 >4 Knee Ankle 8.0 37.0 46 >40  Knee    11.3  3.6          F Wave Studies   NR F-Lat (ms) Lat Norm (ms) L-R F-Lat (ms)  Right Tibial (Mrkrs) (Abd Hallucis)  33.1C     38.06 <55    EMG   Side Muscle Ins Act Fibs Psw Fasc Number Recrt Dur Dur. Amp Amp. Poly Poly. Comment  Right Gastroc Nml Nml Nml Nml 1- Mod-R Few 1+ Nml Nml Nml Nml N/A  Right AntTibialis Nml Nml Nml Nml 1- Mod-R Few 1+ Nml Nml Nml Nml N/A  Right Flex Dig Long Nml Nml Nml Nml 1- Mod-R Some 1+ Some 1+ Nml Nml N/A  Left AntTibialis Nml Nml Nml  Nml 1- Mod-R Some 1+ Few 1+ Nml Nml N/A  Left GluteusMed Nml Nml Nml Nml Nml Nml Nml Nml Nml Nml Nml Nml N/A  Left Gastroc Nml Nml Nml Nml 1- Mod-V Few 1+ Nml Nml Nml Nml N/A  Left Flex Dig Long Nml Nml Nml Nml 1- Mod-R Some 1+ Nml Nml Nml Nml N/A  Left RectFemoris Nml Nml Nml Nml Nml Nml Nml Nml Nml Nml Nml Nml N/A  Left BicepsFemS Nml Nml Nml Nml Nml Nml Nml Nml Nml Nml Nml Nml N/A      Waveforms:

## 2015-07-06 DIAGNOSIS — H8111 Benign paroxysmal vertigo, right ear: Secondary | ICD-10-CM | POA: Diagnosis not present

## 2015-07-06 DIAGNOSIS — M25559 Pain in unspecified hip: Secondary | ICD-10-CM | POA: Diagnosis not present

## 2015-07-12 DIAGNOSIS — H8111 Benign paroxysmal vertigo, right ear: Secondary | ICD-10-CM | POA: Diagnosis not present

## 2015-07-12 DIAGNOSIS — M25559 Pain in unspecified hip: Secondary | ICD-10-CM | POA: Diagnosis not present

## 2015-07-20 ENCOUNTER — Telehealth: Payer: Self-pay | Admitting: Behavioral Health

## 2015-07-20 ENCOUNTER — Encounter: Payer: Self-pay | Admitting: Behavioral Health

## 2015-07-20 DIAGNOSIS — M25559 Pain in unspecified hip: Secondary | ICD-10-CM | POA: Diagnosis not present

## 2015-07-20 DIAGNOSIS — H8111 Benign paroxysmal vertigo, right ear: Secondary | ICD-10-CM | POA: Diagnosis not present

## 2015-07-20 NOTE — Telephone Encounter (Signed)
Pre-Visit Call completed with patient and chart updated.   Pre-Visit Info documented in Specialty Comments under [employer].    

## 2015-07-22 ENCOUNTER — Ambulatory Visit (INDEPENDENT_AMBULATORY_CARE_PROVIDER_SITE_OTHER): Payer: Medicare Other | Admitting: Family Medicine

## 2015-07-22 ENCOUNTER — Encounter: Payer: Self-pay | Admitting: Family Medicine

## 2015-07-22 VITALS — BP 130/78 | HR 78 | Temp 98.3°F | Ht 61.0 in | Wt 157.0 lb

## 2015-07-22 DIAGNOSIS — E039 Hypothyroidism, unspecified: Secondary | ICD-10-CM

## 2015-07-22 DIAGNOSIS — K5900 Constipation, unspecified: Secondary | ICD-10-CM

## 2015-07-22 DIAGNOSIS — R0600 Dyspnea, unspecified: Secondary | ICD-10-CM

## 2015-07-22 DIAGNOSIS — F418 Other specified anxiety disorders: Secondary | ICD-10-CM

## 2015-07-22 DIAGNOSIS — R609 Edema, unspecified: Secondary | ICD-10-CM

## 2015-07-22 DIAGNOSIS — M109 Gout, unspecified: Secondary | ICD-10-CM

## 2015-07-22 DIAGNOSIS — E119 Type 2 diabetes mellitus without complications: Secondary | ICD-10-CM | POA: Diagnosis not present

## 2015-07-22 DIAGNOSIS — D6489 Other specified anemias: Secondary | ICD-10-CM

## 2015-07-22 DIAGNOSIS — R0602 Shortness of breath: Secondary | ICD-10-CM

## 2015-07-22 DIAGNOSIS — Z23 Encounter for immunization: Secondary | ICD-10-CM | POA: Diagnosis not present

## 2015-07-22 DIAGNOSIS — H3530 Unspecified macular degeneration: Secondary | ICD-10-CM

## 2015-07-22 DIAGNOSIS — K2100 Gastro-esophageal reflux disease with esophagitis, without bleeding: Secondary | ICD-10-CM

## 2015-07-22 DIAGNOSIS — I10 Essential (primary) hypertension: Secondary | ICD-10-CM

## 2015-07-22 DIAGNOSIS — E782 Mixed hyperlipidemia: Secondary | ICD-10-CM

## 2015-07-22 HISTORY — DX: Dyspnea, unspecified: R06.00

## 2015-07-22 LAB — COMPREHENSIVE METABOLIC PANEL
ALBUMIN: 4.4 g/dL (ref 3.5–5.2)
ALK PHOS: 73 U/L (ref 39–117)
ALT: 7 U/L (ref 0–35)
AST: 21 U/L (ref 0–37)
BUN: 19 mg/dL (ref 6–23)
CALCIUM: 10.9 mg/dL — AB (ref 8.4–10.5)
CHLORIDE: 102 meq/L (ref 96–112)
CO2: 32 mEq/L (ref 19–32)
CREATININE: 1.13 mg/dL (ref 0.40–1.20)
GFR: 49.05 mL/min — ABNORMAL LOW (ref >60.00)
Glucose, Bld: 97 mg/dL (ref 70–99)
POTASSIUM: 3.8 meq/L (ref 3.5–5.1)
SODIUM: 142 meq/L (ref 135–145)
TOTAL PROTEIN: 7.4 g/dL (ref 6.0–8.3)
Total Bilirubin: 0.7 mg/dL (ref 0.2–1.2)

## 2015-07-22 LAB — CBC
HCT: 39.5 % (ref 36.0–46.0)
Hemoglobin: 13.2 g/dL (ref 12.0–15.0)
MCHC: 33.4 g/dL (ref 30.0–36.0)
MCV: 95.5 fl (ref 78.0–100.0)
Platelets: 230 10*3/uL (ref 150.0–400.0)
RBC: 4.13 Mil/uL (ref 3.87–5.11)
RDW: 13.5 % (ref 11.5–15.5)
WBC: 9.4 10*3/uL (ref 4.0–10.5)

## 2015-07-22 LAB — TSH: TSH: 2.83 u[IU]/mL (ref 0.35–4.50)

## 2015-07-22 LAB — MICROALBUMIN / CREATININE URINE RATIO
CREATININE, U: 220.9 mg/dL
MICROALB UR: 1.3 mg/dL (ref 0.0–1.9)
Microalb Creat Ratio: 0.6 mg/g (ref 0.0–30.0)

## 2015-07-22 LAB — LIPID PANEL
CHOLESTEROL: 214 mg/dL — AB (ref 0–200)
HDL: 52.7 mg/dL (ref >39.00)
LDL Cholesterol: 125 mg/dL — ABNORMAL HIGH (ref 0–99)
NonHDL: 161.58
Total CHOL/HDL Ratio: 4
Triglycerides: 185 mg/dL — ABNORMAL HIGH (ref 0.0–149.0)
VLDL: 37 mg/dL (ref 0.0–40.0)

## 2015-07-22 LAB — H. PYLORI ANTIBODY, IGG: H PYLORI IGG: NEGATIVE

## 2015-07-22 LAB — HEMOGLOBIN A1C: Hgb A1c MFr Bld: 5.7 % (ref 4.6–6.5)

## 2015-07-22 MED ORDER — LANSOPRAZOLE 30 MG PO CPDR: 30.0000 mg | DELAYED_RELEASE_CAPSULE | Freq: Two times a day (BID) | ORAL | Status: DC

## 2015-07-22 NOTE — Assessment & Plan Note (Addendum)
Worsening with exertion, will check a 2 d echo. Which showed worsening LVH. Referred back to cardiology for further consideration.

## 2015-07-22 NOTE — Patient Instructions (Signed)
Food Choices for Gastroesophageal Reflux Disease  When you have gastroesophageal reflux disease (GERD), the foods you eat and your eating habits are very important. Choosing the right foods can help ease your discomfort.   WHAT GUIDELINES DO I NEED TO FOLLOW?   · Choose fruits, vegetables, whole grains, and low-fat dairy products.    · Choose low-fat meat, fish, and poultry.  · Limit fats such as oils, salad dressings, butter, nuts, and avocado.    · Keep a food diary. This helps you identify foods that cause symptoms.    · Avoid foods that cause symptoms. These may be different for everyone.    · Eat small meals often instead of 3 large meals a day.    · Eat your meals slowly, in a place where you are relaxed.    · Limit fried foods.    · Cook foods using methods other than frying.    · Avoid drinking alcohol.    · Avoid drinking large amounts of liquids with your meals.    · Avoid bending over or lying down until 2-3 hours after eating.    WHAT FOODS ARE NOT RECOMMENDED?   These are some foods and drinks that may make your symptoms worse:  Vegetables  Tomatoes. Tomato juice. Tomato and spaghetti sauce. Chili peppers. Onion and garlic. Horseradish.  Fruits  Oranges, grapefruit, and lemon (fruit and juice).  Meats  High-fat meats, fish, and poultry. This includes hot dogs, ribs, ham, sausage, salami, and bacon.  Dairy  Whole milk and chocolate milk. Sour cream. Cream. Butter. Ice cream. Cream cheese.   Drinks  Coffee and tea. Bubbly (carbonated) drinks or energy drinks.  Condiments  Hot sauce. Barbecue sauce.   Sweets/Desserts  Chocolate and cocoa. Donuts. Peppermint and spearmint.  Fats and Oils  High-fat foods. This includes French fries and potato chips.  Other  Vinegar. Strong spices. This includes black pepper, white pepper, red pepper, cayenne, curry powder, cloves, ginger, and chili powder.  The items listed above may not be a complete list of foods and drinks to avoid. Contact your dietitian for more  information.  Document Released: 04/29/2012 Document Revised: 11/03/2013 Document Reviewed: 09/02/2013  [employer]® Patient Information ©2015 [employer]. This information is not intended to replace advice given to you by your health care provider. Make sure you discuss any questions you have with your health care provider.
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
## 2015-07-22 NOTE — Progress Notes (Signed)
Pre visit review using our clinic review tool, if applicable. No additional management support is needed unless otherwise documented below in the visit note. 

## 2015-07-26 DIAGNOSIS — M25559 Pain in unspecified hip: Secondary | ICD-10-CM | POA: Diagnosis not present

## 2015-07-26 DIAGNOSIS — H8111 Benign paroxysmal vertigo, right ear: Secondary | ICD-10-CM | POA: Diagnosis not present

## 2015-07-29 DIAGNOSIS — M25559 Pain in unspecified hip: Secondary | ICD-10-CM | POA: Diagnosis not present

## 2015-07-29 DIAGNOSIS — H8111 Benign paroxysmal vertigo, right ear: Secondary | ICD-10-CM | POA: Diagnosis not present

## 2015-08-01 DIAGNOSIS — H04123 Dry eye syndrome of bilateral lacrimal glands: Secondary | ICD-10-CM | POA: Diagnosis not present

## 2015-08-01 DIAGNOSIS — Z961 Presence of intraocular lens: Secondary | ICD-10-CM | POA: Diagnosis not present

## 2015-08-03 ENCOUNTER — Ambulatory Visit (HOSPITAL_BASED_OUTPATIENT_CLINIC_OR_DEPARTMENT_OTHER)
Admission: RE | Admit: 2015-08-03 | Discharge: 2015-08-03 | Disposition: A | Discharge status: Home / Self Care | Payer: Medicare Other | Source: Ambulatory Visit | Attending: Family Medicine | Admitting: Family Medicine

## 2015-08-03 DIAGNOSIS — I10 Essential (primary) hypertension: Secondary | ICD-10-CM | POA: Diagnosis not present

## 2015-08-03 DIAGNOSIS — I071 Rheumatic tricuspid insufficiency: Secondary | ICD-10-CM | POA: Insufficient documentation

## 2015-08-03 DIAGNOSIS — I059 Rheumatic mitral valve disease, unspecified: Secondary | ICD-10-CM | POA: Insufficient documentation

## 2015-08-03 DIAGNOSIS — R0602 Shortness of breath: Secondary | ICD-10-CM

## 2015-08-03 DIAGNOSIS — R0600 Dyspnea, unspecified: Secondary | ICD-10-CM | POA: Insufficient documentation

## 2015-08-03 DIAGNOSIS — I5189 Other ill-defined heart diseases: Secondary | ICD-10-CM | POA: Diagnosis not present

## 2015-08-03 DIAGNOSIS — E785 Hyperlipidemia, unspecified: Secondary | ICD-10-CM | POA: Diagnosis not present

## 2015-08-03 NOTE — Progress Notes (Signed)
  Echocardiogram 2D Echocardiogram has been performed.  Christina Mckinney 08/03/2015, 11:09 AM

## 2015-08-04 ENCOUNTER — Other Ambulatory Visit: Payer: Self-pay | Admitting: Family Medicine

## 2015-08-04 DIAGNOSIS — I517 Cardiomegaly: Secondary | ICD-10-CM

## 2015-08-07 ENCOUNTER — Encounter: Payer: Self-pay | Admitting: Family Medicine

## 2015-08-07 DIAGNOSIS — Z23 Encounter for immunization: Secondary | ICD-10-CM | POA: Insufficient documentation

## 2015-08-07 DIAGNOSIS — H3530 Unspecified macular degeneration: Secondary | ICD-10-CM

## 2015-08-07 HISTORY — DX: Unspecified macular degeneration: H35.30

## 2015-08-07 NOTE — Assessment & Plan Note (Signed)
Follows with Dr Gershon Crane

## 2015-08-07 NOTE — Assessment & Plan Note (Signed)
Encouraged heart healthy diet, increase exercise, avoid trans fats, consider a krill oil cap daily 

## 2015-08-07 NOTE — Assessment & Plan Note (Signed)
Worsening, H Pylori neg. Avoid offending foods, start probiotics. Do not eat large meals in late evening and consider raising head of bed. Add ranitidine and may need referral back to GI

## 2015-08-07 NOTE — Assessment & Plan Note (Signed)
Well controlled, no changes to meds. Encouraged heart healthy diet such as the DASH diet and exercise as tolerated.  °

## 2015-08-07 NOTE — Assessment & Plan Note (Signed)
Given flu shot  today 

## 2015-08-07 NOTE — Assessment & Plan Note (Signed)
On Levothyroxine, continue to monitor 

## 2015-08-07 NOTE — Assessment & Plan Note (Addendum)
Doing well, may use Lorazepam prn

## 2015-08-07 NOTE — Assessment & Plan Note (Signed)
Tolerating Uloric

## 2015-08-08 DIAGNOSIS — H8111 Benign paroxysmal vertigo, right ear: Secondary | ICD-10-CM | POA: Diagnosis not present

## 2015-08-08 DIAGNOSIS — M25559 Pain in unspecified hip: Secondary | ICD-10-CM | POA: Diagnosis not present

## 2015-08-10 DIAGNOSIS — M25559 Pain in unspecified hip: Secondary | ICD-10-CM | POA: Diagnosis not present

## 2015-08-10 DIAGNOSIS — H8111 Benign paroxysmal vertigo, right ear: Secondary | ICD-10-CM | POA: Diagnosis not present

## 2015-08-11 ENCOUNTER — Other Ambulatory Visit: Payer: Self-pay | Admitting: Internal Medicine

## 2015-08-12 ENCOUNTER — Encounter: Payer: Self-pay | Admitting: Cardiology

## 2015-08-12 ENCOUNTER — Ambulatory Visit (INDEPENDENT_AMBULATORY_CARE_PROVIDER_SITE_OTHER): Payer: Medicare Other | Admitting: Cardiology

## 2015-08-12 VITALS — BP 140/68 | HR 74 | Ht 61.0 in | Wt 157.0 lb

## 2015-08-12 DIAGNOSIS — I110 Hypertensive heart disease with heart failure: Secondary | ICD-10-CM

## 2015-08-12 DIAGNOSIS — I509 Heart failure, unspecified: Secondary | ICD-10-CM | POA: Diagnosis not present

## 2015-08-12 DIAGNOSIS — I5022 Chronic systolic (congestive) heart failure: Secondary | ICD-10-CM

## 2015-08-12 DIAGNOSIS — I119 Hypertensive heart disease without heart failure: Secondary | ICD-10-CM | POA: Insufficient documentation

## 2015-08-12 HISTORY — DX: Chronic systolic (congestive) heart failure: I50.22
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
## 2015-08-12 NOTE — Patient Instructions (Signed)
Continue your current therapy   Restrict your salt intake  Exercise regularly  I will see you in one year.

## 2015-08-16 DIAGNOSIS — H8111 Benign paroxysmal vertigo, right ear: Secondary | ICD-10-CM | POA: Diagnosis not present

## 2015-08-16 DIAGNOSIS — M25559 Pain in unspecified hip: Secondary | ICD-10-CM | POA: Diagnosis not present

## 2015-08-18 DIAGNOSIS — H8111 Benign paroxysmal vertigo, right ear: Secondary | ICD-10-CM | POA: Diagnosis not present

## 2015-08-18 DIAGNOSIS — M25559 Pain in unspecified hip: Secondary | ICD-10-CM | POA: Diagnosis not present

## 2015-08-23 DIAGNOSIS — M25559 Pain in unspecified hip: Secondary | ICD-10-CM | POA: Diagnosis not present

## 2015-08-23 DIAGNOSIS — H8111 Benign paroxysmal vertigo, right ear: Secondary | ICD-10-CM | POA: Diagnosis not present

## 2015-08-25 DIAGNOSIS — M25559 Pain in unspecified hip: Secondary | ICD-10-CM | POA: Diagnosis not present

## 2015-08-25 DIAGNOSIS — H8111 Benign paroxysmal vertigo, right ear: Secondary | ICD-10-CM | POA: Diagnosis not present

## 2015-08-29 DIAGNOSIS — H8111 Benign paroxysmal vertigo, right ear: Secondary | ICD-10-CM | POA: Diagnosis not present

## 2015-08-29 DIAGNOSIS — M25559 Pain in unspecified hip: Secondary | ICD-10-CM | POA: Diagnosis not present

## 2015-08-31 DIAGNOSIS — M25559 Pain in unspecified hip: Secondary | ICD-10-CM | POA: Diagnosis not present

## 2015-08-31 DIAGNOSIS — H8111 Benign paroxysmal vertigo, right ear: Secondary | ICD-10-CM | POA: Diagnosis not present

## 2015-09-05 ENCOUNTER — Ambulatory Visit (INDEPENDENT_AMBULATORY_CARE_PROVIDER_SITE_OTHER): Payer: Medicare Other | Admitting: Family Medicine

## 2015-09-05 ENCOUNTER — Encounter: Payer: Self-pay | Admitting: Family Medicine

## 2015-09-05 VITALS — BP 120/68 | HR 76 | Temp 98.5°F | Ht 61.0 in | Wt 157.0 lb

## 2015-09-05 DIAGNOSIS — K2100 Gastro-esophageal reflux disease with esophagitis, without bleeding: Secondary | ICD-10-CM

## 2015-09-05 DIAGNOSIS — I10 Essential (primary) hypertension: Secondary | ICD-10-CM

## 2015-09-05 DIAGNOSIS — E119 Type 2 diabetes mellitus without complications: Secondary | ICD-10-CM | POA: Diagnosis not present

## 2015-09-05 DIAGNOSIS — F32A Depression, unspecified: Secondary | ICD-10-CM

## 2015-09-05 DIAGNOSIS — G4700 Insomnia, unspecified: Secondary | ICD-10-CM

## 2015-09-05 DIAGNOSIS — F329 Major depressive disorder, single episode, unspecified: Secondary | ICD-10-CM

## 2015-09-05 DIAGNOSIS — J0101 Acute recurrent maxillary sinusitis: Secondary | ICD-10-CM

## 2015-09-05 DIAGNOSIS — H8110 Benign paroxysmal vertigo, unspecified ear: Secondary | ICD-10-CM

## 2015-09-05 DIAGNOSIS — Z23 Encounter for immunization: Secondary | ICD-10-CM | POA: Diagnosis not present

## 2015-09-05 DIAGNOSIS — R0600 Dyspnea, unspecified: Secondary | ICD-10-CM | POA: Diagnosis not present

## 2015-09-05 DIAGNOSIS — K5900 Constipation, unspecified: Secondary | ICD-10-CM

## 2015-09-05 DIAGNOSIS — F418 Other specified anxiety disorders: Secondary | ICD-10-CM

## 2015-09-05 DIAGNOSIS — D6489 Other specified anemias: Secondary | ICD-10-CM

## 2015-09-05 DIAGNOSIS — F419 Anxiety disorder, unspecified: Secondary | ICD-10-CM

## 2015-09-05 DIAGNOSIS — R0602 Shortness of breath: Secondary | ICD-10-CM

## 2015-09-05 MED ORDER — CEFDINIR 300 MG PO CAPS: 300.0000 mg | ORAL_CAPSULE | Freq: Two times a day (BID) | ORAL | Status: AC

## 2015-09-05 MED ORDER — ALBUTEROL SULFATE HFA 108 (90 BASE) MCG/ACT IN AERS: 2.0000 | INHALATION_SPRAY | Freq: Four times a day (QID) | RESPIRATORY_TRACT | Status: DC | PRN

## 2015-09-05 MED ORDER — PROPRANOLOL HCL ER 60 MG PO CP24: 60.0000 mg | ORAL_CAPSULE | Freq: Every day | ORAL | Status: DC

## 2015-09-05 MED ORDER — ALPRAZOLAM 0.5 MG PO TBDP: 0.5000 mg | ORAL_TABLET | Freq: Every evening | ORAL | Status: DC | PRN

## 2015-09-05 MED ORDER — FEBUXOSTAT 40 MG PO TABS: 40.0000 mg | ORAL_TABLET | Freq: Every day | ORAL | Status: DC

## 2015-09-05 MED ORDER — LANSOPRAZOLE 30 MG PO CPDR: 30.0000 mg | DELAYED_RELEASE_CAPSULE | Freq: Two times a day (BID) | ORAL | Status: DC

## 2015-09-05 MED ORDER — ALPRAZOLAM 0.5 MG PO TABS: 0.5000 mg | ORAL_TABLET | Freq: Every evening | ORAL | Status: DC | PRN

## 2015-09-05 NOTE — Progress Notes (Signed)
Pre visit review using our clinic review tool, if applicable. No additional management support is needed unless otherwise documented below in the visit note. 

## 2015-09-05 NOTE — Patient Instructions (Signed)
Hoarseness  Hoarseness is any abnormal change in your voice.Hoarseness can make it difficult to speak. Your voice may sound raspy, breathy, or strained.  Hoarseness is caused by a problem with the vocal cords. The vocal cords are two bands of tissue inside your voice box (larynx). When you speak, your vocal cords move back and forth to create sound. The surfaces of your vocal cords need to be smooth for your voice to sound clear. Swelling or lumps on the vocal cords can cause hoarseness.  Common causes of vocal cord problems include:   Upper airway infection.   A long-term cough.   Straining or overusing your voice.   Smoking.   Allergies.   Vocal cord growths.   Stomach acids that flow up from your stomach and irritate your vocal cords (gastroesophageal reflux).  HOME CARE INSTRUCTIONS  Watch your condition for any changes. To ease any discomfort that you feel:   Rest your voice. Do not whisper. Whispering can cause muscle strain.   Do not speak in a loud or harsh voice that makes your hoarseness worse.   Do not use any tobacco products, including cigarettes, chewing tobacco, or electronic cigarettes. If you need help quitting, ask your health care provider.   Avoid secondhand smoke.   Do not eat foods that give you heartburn. Heartburn can make gastroesophageal reflux worse.   Do not drink coffee.   Do not drink alcohol.   Drink enough fluids to keep your urine clear or pale yellow.   Use a humidifier if the air in your home is dry.  SEEK MEDICAL CARE IF:   You have hoarseness that lasts longer than 3 weeks.   You almost lose or completelylose your voice for longer than 3 days.   You have pain when you swallow or try to talk.   You feel a lump in your neck.  SEEK IMMEDIATE MEDICAL CARE IF:   You have trouble swallowing.   You feel as though you are choking when you swallow.   You cough up blood or vomit blood.   You have trouble breathing.     This information is not intended to replace  advice given to you by your health care provider. Make sure you discuss any questions you have with your health care provider.     Document Released: 10/12/2005 Document Revised: 03/15/2015 Document Reviewed: 10/20/2014  Elsevier Interactive Patient Education 2016 [employer]

## 2015-09-06 DIAGNOSIS — H8111 Benign paroxysmal vertigo, right ear: Secondary | ICD-10-CM | POA: Diagnosis not present

## 2015-09-06 DIAGNOSIS — M25559 Pain in unspecified hip: Secondary | ICD-10-CM | POA: Diagnosis not present

## 2015-09-06 LAB — COMPREHENSIVE METABOLIC PANEL
ALT: 8 U/L (ref 0–35)
AST: 22 U/L (ref 0–37)
Albumin: 4.2 g/dL (ref 3.5–5.2)
Alkaline Phosphatase: 72 U/L (ref 39–117)
BUN: 15 mg/dL (ref 6–23)
CHLORIDE: 102 meq/L (ref 96–112)
CO2: 32 meq/L (ref 19–32)
CREATININE: 1.12 mg/dL (ref 0.40–1.20)
Calcium: 10.2 mg/dL (ref 8.4–10.5)
GFR: 49.55 mL/min — ABNORMAL LOW (ref >60.00)
GLUCOSE: 87 mg/dL (ref 70–99)
POTASSIUM: 3.8 meq/L (ref 3.5–5.1)
Sodium: 141 mEq/L (ref 135–145)
Total Bilirubin: 0.4 mg/dL (ref 0.2–1.2)
Total Protein: 7.3 g/dL (ref 6.0–8.3)

## 2015-09-06 LAB — VITAMIN D 25 HYDROXY (VIT D DEFICIENCY, FRACTURES): VITD: 89.57 ng/mL (ref 30.00–100.00)

## 2015-09-07 ENCOUNTER — Telehealth: Payer: Self-pay | Admitting: Family Medicine

## 2015-09-07 NOTE — Telephone Encounter (Signed)
Pt is returning your call.   CB#: [phone]

## 2015-09-08 DIAGNOSIS — H8111 Benign paroxysmal vertigo, right ear: Secondary | ICD-10-CM | POA: Diagnosis not present

## 2015-09-08 DIAGNOSIS — M25559 Pain in unspecified hip: Secondary | ICD-10-CM | POA: Diagnosis not present

## 2015-09-08 NOTE — Telephone Encounter (Signed)
Patient informed of lab results.  See results notes for more details if necessary.

## 2015-09-13 DIAGNOSIS — H8111 Benign paroxysmal vertigo, right ear: Secondary | ICD-10-CM | POA: Diagnosis not present

## 2015-09-13 DIAGNOSIS — M25559 Pain in unspecified hip: Secondary | ICD-10-CM | POA: Diagnosis not present

## 2015-09-15 DIAGNOSIS — M25559 Pain in unspecified hip: Secondary | ICD-10-CM | POA: Diagnosis not present

## 2015-09-15 DIAGNOSIS — H8111 Benign paroxysmal vertigo, right ear: Secondary | ICD-10-CM | POA: Diagnosis not present

## 2015-09-18 ENCOUNTER — Encounter: Payer: Self-pay | Admitting: Family Medicine

## 2015-09-18 DIAGNOSIS — F329 Major depressive disorder, single episode, unspecified: Secondary | ICD-10-CM | POA: Insufficient documentation

## 2015-09-18 DIAGNOSIS — F419 Anxiety disorder, unspecified: Secondary | ICD-10-CM

## 2015-09-18 DIAGNOSIS — J0101 Acute recurrent maxillary sinusitis: Secondary | ICD-10-CM | POA: Insufficient documentation

## 2015-09-18 HISTORY — DX: Anxiety disorder, unspecified: F41.9

## 2015-09-18 NOTE — Assessment & Plan Note (Signed)
Well controlled, no changes to meds. Encouraged heart healthy diet such as the DASH diet and exercise as tolerated.  °

## 2015-09-18 NOTE — Assessment & Plan Note (Signed)
Avoid offending foods, start probiotics. Do not eat large meals in late evening and consider raising head of bed. Improved with Prevacid

## 2015-09-18 NOTE — Assessment & Plan Note (Signed)
Started on Cefdinir and Mucinex. Encouraged increased rest and hydration, add probiotics, zinc such as Coldeze or Xicam. Treat fevers as needed
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
## 2015-09-18 NOTE — Assessment & Plan Note (Signed)
Given refill on Alprazolam, she agrees to report if worsens

## 2015-09-18 NOTE — Assessment & Plan Note (Signed)
hgba1c acceptable, minimize simple carbs. Increase exercise as tolerated. Continue current meds 
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
## 2015-09-19 DIAGNOSIS — M25559 Pain in unspecified hip: Secondary | ICD-10-CM | POA: Diagnosis not present

## 2015-09-19 DIAGNOSIS — H8111 Benign paroxysmal vertigo, right ear: Secondary | ICD-10-CM | POA: Diagnosis not present

## 2015-09-21 DIAGNOSIS — H8111 Benign paroxysmal vertigo, right ear: Secondary | ICD-10-CM | POA: Diagnosis not present

## 2015-09-21 DIAGNOSIS — M25559 Pain in unspecified hip: Secondary | ICD-10-CM | POA: Diagnosis not present

## 2015-09-27 DIAGNOSIS — H8111 Benign paroxysmal vertigo, right ear: Secondary | ICD-10-CM | POA: Diagnosis not present

## 2015-09-27 DIAGNOSIS — M25559 Pain in unspecified hip: Secondary | ICD-10-CM | POA: Diagnosis not present

## 2015-09-28 DIAGNOSIS — N63 Unspecified lump in breast: Secondary | ICD-10-CM | POA: Diagnosis not present

## 2015-09-29 DIAGNOSIS — H8111 Benign paroxysmal vertigo, right ear: Secondary | ICD-10-CM | POA: Diagnosis not present

## 2015-09-29 DIAGNOSIS — M25559 Pain in unspecified hip: Secondary | ICD-10-CM | POA: Diagnosis not present

## 2015-10-05 DIAGNOSIS — H8111 Benign paroxysmal vertigo, right ear: Secondary | ICD-10-CM | POA: Diagnosis not present

## 2015-10-05 DIAGNOSIS — M25559 Pain in unspecified hip: Secondary | ICD-10-CM | POA: Diagnosis not present

## 2015-10-10 DIAGNOSIS — M25559 Pain in unspecified hip: Secondary | ICD-10-CM | POA: Diagnosis not present

## 2015-10-10 DIAGNOSIS — H8111 Benign paroxysmal vertigo, right ear: Secondary | ICD-10-CM | POA: Diagnosis not present

## 2015-10-11 DIAGNOSIS — Z86718 Personal history of other venous thrombosis and embolism: Secondary | ICD-10-CM | POA: Diagnosis not present

## 2015-10-11 DIAGNOSIS — R32 Unspecified urinary incontinence: Secondary | ICD-10-CM | POA: Diagnosis not present

## 2015-10-11 DIAGNOSIS — J029 Acute pharyngitis, unspecified: Secondary | ICD-10-CM | POA: Diagnosis not present

## 2015-10-11 DIAGNOSIS — F419 Anxiety disorder, unspecified: Secondary | ICD-10-CM | POA: Diagnosis not present

## 2015-10-11 DIAGNOSIS — R200 Anesthesia of skin: Secondary | ICD-10-CM | POA: Diagnosis not present

## 2015-10-11 DIAGNOSIS — F329 Major depressive disorder, single episode, unspecified: Secondary | ICD-10-CM | POA: Diagnosis not present

## 2015-10-11 DIAGNOSIS — R0789 Other chest pain: Secondary | ICD-10-CM | POA: Diagnosis not present

## 2015-10-11 DIAGNOSIS — J449 Chronic obstructive pulmonary disease, unspecified: Secondary | ICD-10-CM | POA: Diagnosis not present

## 2015-10-11 DIAGNOSIS — R112 Nausea with vomiting, unspecified: Secondary | ICD-10-CM | POA: Diagnosis not present

## 2015-10-11 DIAGNOSIS — R490 Dysphonia: Secondary | ICD-10-CM | POA: Diagnosis not present

## 2015-10-11 DIAGNOSIS — K219 Gastro-esophageal reflux disease without esophagitis: Secondary | ICD-10-CM | POA: Diagnosis not present

## 2015-10-11 DIAGNOSIS — L659 Nonscarring hair loss, unspecified: Secondary | ICD-10-CM | POA: Diagnosis not present

## 2015-10-11 DIAGNOSIS — K449 Diaphragmatic hernia without obstruction or gangrene: Secondary | ICD-10-CM | POA: Diagnosis not present

## 2015-10-11 DIAGNOSIS — R140 Abdominal distension (gaseous): Secondary | ICD-10-CM | POA: Diagnosis not present

## 2015-10-11 DIAGNOSIS — I129 Hypertensive chronic kidney disease with stage 1 through stage 4 chronic kidney disease, or unspecified chronic kidney disease: Secondary | ICD-10-CM | POA: Diagnosis not present

## 2015-10-11 DIAGNOSIS — Z86711 Personal history of pulmonary embolism: Secondary | ICD-10-CM | POA: Diagnosis not present

## 2015-10-11 DIAGNOSIS — M791 Myalgia: Secondary | ICD-10-CM | POA: Diagnosis not present

## 2015-10-11 DIAGNOSIS — R61 Generalized hyperhidrosis: Secondary | ICD-10-CM | POA: Diagnosis not present

## 2015-10-11 DIAGNOSIS — R5383 Other fatigue: Secondary | ICD-10-CM | POA: Diagnosis not present

## 2015-10-11 DIAGNOSIS — Z800 Family history of malignant neoplasm of digestive organs: Secondary | ICD-10-CM | POA: Diagnosis not present

## 2015-10-11 DIAGNOSIS — H9190 Unspecified hearing loss, unspecified ear: Secondary | ICD-10-CM | POA: Diagnosis not present

## 2015-10-11 DIAGNOSIS — N183 Chronic kidney disease, stage 3 (moderate): Secondary | ICD-10-CM | POA: Diagnosis not present

## 2015-10-11 DIAGNOSIS — R202 Paresthesia of skin: Secondary | ICD-10-CM | POA: Diagnosis not present

## 2015-10-11 DIAGNOSIS — K5792 Diverticulitis of intestine, part unspecified, without perforation or abscess without bleeding: Secondary | ICD-10-CM | POA: Diagnosis not present

## 2015-10-11 DIAGNOSIS — R350 Frequency of micturition: Secondary | ICD-10-CM | POA: Diagnosis not present

## 2015-10-13 DIAGNOSIS — M25559 Pain in unspecified hip: Secondary | ICD-10-CM | POA: Diagnosis not present

## 2015-10-13 DIAGNOSIS — H8111 Benign paroxysmal vertigo, right ear: Secondary | ICD-10-CM | POA: Diagnosis not present

## 2015-10-18 DIAGNOSIS — H8111 Benign paroxysmal vertigo, right ear: Secondary | ICD-10-CM | POA: Diagnosis not present

## 2015-10-18 DIAGNOSIS — M25559 Pain in unspecified hip: Secondary | ICD-10-CM | POA: Diagnosis not present

## 2015-10-20 ENCOUNTER — Other Ambulatory Visit: Payer: Self-pay | Admitting: Family Medicine

## 2015-10-20 DIAGNOSIS — H8111 Benign paroxysmal vertigo, right ear: Secondary | ICD-10-CM | POA: Diagnosis not present

## 2015-10-20 DIAGNOSIS — M25559 Pain in unspecified hip: Secondary | ICD-10-CM | POA: Diagnosis not present

## 2015-10-20 MED ORDER — ALPRAZOLAM 0.5 MG PO TABS: 0.5000 mg | ORAL_TABLET | Freq: Every evening | ORAL | Status: DC | PRN

## 2015-10-20 MED ORDER — GABAPENTIN 100 MG PO CAPS
ORAL_CAPSULE | ORAL | Status: DC
Start: 2015-10-20 — End: 2016-02-02

## 2015-10-20 NOTE — Telephone Encounter (Signed)
Printed Alprazolam and will [facility].

## 2015-10-20 NOTE — Telephone Encounter (Signed)
Because she only takes one a day she can have #90 with 1 rf to mail order

## 2015-10-20 NOTE — Telephone Encounter (Signed)
Requesting: Alprazolam Contract  None UDS  None Last OV  09/05/2015 Last Refill  #30 with 3 refills on 09/05/2015  Please Advise  THIS refill was done to a local pharmacy, she is requesting refill to go to mail order

## 2015-10-25 DIAGNOSIS — H8111 Benign paroxysmal vertigo, right ear: Secondary | ICD-10-CM | POA: Diagnosis not present

## 2015-10-25 DIAGNOSIS — M25559 Pain in unspecified hip: Secondary | ICD-10-CM | POA: Diagnosis not present

## 2015-10-27 DIAGNOSIS — H8111 Benign paroxysmal vertigo, right ear: Secondary | ICD-10-CM | POA: Diagnosis not present

## 2015-10-27 DIAGNOSIS — M25559 Pain in unspecified hip: Secondary | ICD-10-CM | POA: Diagnosis not present

## 2015-11-01 DIAGNOSIS — M25559 Pain in unspecified hip: Secondary | ICD-10-CM | POA: Diagnosis not present

## 2015-11-01 DIAGNOSIS — H8111 Benign paroxysmal vertigo, right ear: Secondary | ICD-10-CM | POA: Diagnosis not present

## 2015-11-03 DIAGNOSIS — M25559 Pain in unspecified hip: Secondary | ICD-10-CM | POA: Diagnosis not present

## 2015-11-03 DIAGNOSIS — H8111 Benign paroxysmal vertigo, right ear: Secondary | ICD-10-CM | POA: Diagnosis not present

## 2015-11-08 DIAGNOSIS — H8111 Benign paroxysmal vertigo, right ear: Secondary | ICD-10-CM | POA: Diagnosis not present

## 2015-11-08 DIAGNOSIS — M25559 Pain in unspecified hip: Secondary | ICD-10-CM | POA: Diagnosis not present

## 2015-11-09 ENCOUNTER — Encounter: Payer: Self-pay | Admitting: *Deleted

## 2015-11-10 DIAGNOSIS — I10 Essential (primary) hypertension: Secondary | ICD-10-CM | POA: Diagnosis not present

## 2015-11-10 DIAGNOSIS — Z01810 Encounter for preprocedural cardiovascular examination: Secondary | ICD-10-CM | POA: Diagnosis not present

## 2015-11-11 ENCOUNTER — Other Ambulatory Visit: Payer: Self-pay | Admitting: Family Medicine

## 2015-11-16 DIAGNOSIS — M25559 Pain in unspecified hip: Secondary | ICD-10-CM | POA: Diagnosis not present

## 2015-11-16 DIAGNOSIS — H8111 Benign paroxysmal vertigo, right ear: Secondary | ICD-10-CM | POA: Diagnosis not present

## 2015-11-17 DIAGNOSIS — E785 Hyperlipidemia, unspecified: Secondary | ICD-10-CM | POA: Diagnosis not present

## 2015-11-17 DIAGNOSIS — J449 Chronic obstructive pulmonary disease, unspecified: Secondary | ICD-10-CM | POA: Diagnosis not present

## 2015-11-17 DIAGNOSIS — M109 Gout, unspecified: Secondary | ICD-10-CM | POA: Diagnosis not present

## 2015-11-17 DIAGNOSIS — Z7951 Long term (current) use of inhaled steroids: Secondary | ICD-10-CM | POA: Diagnosis not present

## 2015-11-17 DIAGNOSIS — G2581 Restless legs syndrome: Secondary | ICD-10-CM | POA: Diagnosis not present

## 2015-11-17 DIAGNOSIS — Z79891 Long term (current) use of opiate analgesic: Secondary | ICD-10-CM | POA: Diagnosis not present

## 2015-11-17 DIAGNOSIS — K319 Disease of stomach and duodenum, unspecified: Secondary | ICD-10-CM | POA: Diagnosis not present

## 2015-11-17 DIAGNOSIS — R12 Heartburn: Secondary | ICD-10-CM | POA: Diagnosis not present

## 2015-11-17 DIAGNOSIS — I5032 Chronic diastolic (congestive) heart failure: Secondary | ICD-10-CM | POA: Diagnosis not present

## 2015-11-17 DIAGNOSIS — Z888 Allergy status to other drugs, medicaments and biological substances status: Secondary | ICD-10-CM | POA: Diagnosis not present

## 2015-11-17 DIAGNOSIS — Z7982 Long term (current) use of aspirin: Secondary | ICD-10-CM | POA: Diagnosis not present

## 2015-11-17 DIAGNOSIS — Z96651 Presence of right artificial knee joint: Secondary | ICD-10-CM | POA: Diagnosis not present

## 2015-11-17 DIAGNOSIS — Z96619 Presence of unspecified artificial shoulder joint: Secondary | ICD-10-CM | POA: Diagnosis not present

## 2015-11-17 DIAGNOSIS — I2510 Atherosclerotic heart disease of native coronary artery without angina pectoris: Secondary | ICD-10-CM | POA: Diagnosis not present

## 2015-11-17 DIAGNOSIS — K449 Diaphragmatic hernia without obstruction or gangrene: Secondary | ICD-10-CM | POA: Diagnosis not present

## 2015-11-17 DIAGNOSIS — I10 Essential (primary) hypertension: Secondary | ICD-10-CM | POA: Diagnosis not present

## 2015-11-17 DIAGNOSIS — K2930 Chronic superficial gastritis without bleeding: Secondary | ICD-10-CM | POA: Diagnosis not present

## 2015-11-17 DIAGNOSIS — I130 Hypertensive heart and chronic kidney disease with heart failure and stage 1 through stage 4 chronic kidney disease, or unspecified chronic kidney disease: Secondary | ICD-10-CM | POA: Diagnosis not present

## 2015-11-17 DIAGNOSIS — Z79899 Other long term (current) drug therapy: Secondary | ICD-10-CM | POA: Diagnosis not present

## 2015-11-17 DIAGNOSIS — K219 Gastro-esophageal reflux disease without esophagitis: Secondary | ICD-10-CM | POA: Diagnosis not present

## 2015-11-17 DIAGNOSIS — Z9102 Food additives allergy status: Secondary | ICD-10-CM | POA: Diagnosis not present

## 2015-11-17 DIAGNOSIS — J45909 Unspecified asthma, uncomplicated: Secondary | ICD-10-CM | POA: Diagnosis not present

## 2015-11-17 DIAGNOSIS — E039 Hypothyroidism, unspecified: Secondary | ICD-10-CM | POA: Diagnosis not present

## 2015-11-17 DIAGNOSIS — N183 Chronic kidney disease, stage 3 (moderate): Secondary | ICD-10-CM | POA: Diagnosis not present

## 2015-11-17 DIAGNOSIS — R112 Nausea with vomiting, unspecified: Secondary | ICD-10-CM | POA: Diagnosis not present

## 2015-11-17 DIAGNOSIS — Z87891 Personal history of nicotine dependence: Secondary | ICD-10-CM | POA: Diagnosis not present

## 2015-11-17 DIAGNOSIS — Z86711 Personal history of pulmonary embolism: Secondary | ICD-10-CM | POA: Diagnosis not present

## 2015-11-17 DIAGNOSIS — R42 Dizziness and giddiness: Secondary | ICD-10-CM | POA: Diagnosis not present

## 2015-11-24 DIAGNOSIS — H8111 Benign paroxysmal vertigo, right ear: Secondary | ICD-10-CM | POA: Diagnosis not present

## 2015-11-24 DIAGNOSIS — M25559 Pain in unspecified hip: Secondary | ICD-10-CM | POA: Diagnosis not present

## 2015-11-29 DIAGNOSIS — M25559 Pain in unspecified hip: Secondary | ICD-10-CM | POA: Diagnosis not present

## 2015-11-29 DIAGNOSIS — H8111 Benign paroxysmal vertigo, right ear: Secondary | ICD-10-CM | POA: Diagnosis not present

## 2015-12-01 DIAGNOSIS — M25559 Pain in unspecified hip: Secondary | ICD-10-CM | POA: Diagnosis not present

## 2015-12-01 DIAGNOSIS — H8111 Benign paroxysmal vertigo, right ear: Secondary | ICD-10-CM | POA: Diagnosis not present

## 2015-12-05 DIAGNOSIS — H8111 Benign paroxysmal vertigo, right ear: Secondary | ICD-10-CM | POA: Diagnosis not present

## 2015-12-05 DIAGNOSIS — M25559 Pain in unspecified hip: Secondary | ICD-10-CM | POA: Diagnosis not present

## 2015-12-07 ENCOUNTER — Other Ambulatory Visit: Payer: Self-pay | Admitting: Family Medicine

## 2015-12-07 MED ORDER — FUROSEMIDE 40 MG PO TABS: 40.0000 mg | ORAL_TABLET | Freq: Every day | ORAL | Status: DC

## 2015-12-08 DIAGNOSIS — H8111 Benign paroxysmal vertigo, right ear: Secondary | ICD-10-CM | POA: Diagnosis not present

## 2015-12-08 DIAGNOSIS — M25559 Pain in unspecified hip: Secondary | ICD-10-CM | POA: Diagnosis not present

## 2015-12-13 DIAGNOSIS — H8111 Benign paroxysmal vertigo, right ear: Secondary | ICD-10-CM | POA: Diagnosis not present

## 2015-12-13 DIAGNOSIS — M25559 Pain in unspecified hip: Secondary | ICD-10-CM | POA: Diagnosis not present

## 2015-12-14 DIAGNOSIS — H9190 Unspecified hearing loss, unspecified ear: Secondary | ICD-10-CM | POA: Diagnosis not present

## 2015-12-14 DIAGNOSIS — F419 Anxiety disorder, unspecified: Secondary | ICD-10-CM | POA: Diagnosis not present

## 2015-12-14 DIAGNOSIS — E079 Disorder of thyroid, unspecified: Secondary | ICD-10-CM | POA: Diagnosis not present

## 2015-12-14 DIAGNOSIS — R42 Dizziness and giddiness: Secondary | ICD-10-CM | POA: Diagnosis not present

## 2015-12-14 DIAGNOSIS — R350 Frequency of micturition: Secondary | ICD-10-CM | POA: Diagnosis not present

## 2015-12-14 DIAGNOSIS — R269 Unspecified abnormalities of gait and mobility: Secondary | ICD-10-CM | POA: Diagnosis not present

## 2015-12-14 DIAGNOSIS — R062 Wheezing: Secondary | ICD-10-CM | POA: Diagnosis not present

## 2015-12-14 DIAGNOSIS — G629 Polyneuropathy, unspecified: Secondary | ICD-10-CM | POA: Diagnosis not present

## 2015-12-14 DIAGNOSIS — G2581 Restless legs syndrome: Secondary | ICD-10-CM | POA: Diagnosis not present

## 2015-12-14 DIAGNOSIS — R0602 Shortness of breath: Secondary | ICD-10-CM | POA: Diagnosis not present

## 2015-12-14 DIAGNOSIS — R270 Ataxia, unspecified: Secondary | ICD-10-CM | POA: Diagnosis not present

## 2015-12-14 DIAGNOSIS — R5383 Other fatigue: Secondary | ICD-10-CM | POA: Diagnosis not present

## 2015-12-14 DIAGNOSIS — Z9181 History of falling: Secondary | ICD-10-CM | POA: Diagnosis not present

## 2015-12-14 DIAGNOSIS — K5900 Constipation, unspecified: Secondary | ICD-10-CM | POA: Diagnosis not present

## 2015-12-15 ENCOUNTER — Telehealth: Payer: Self-pay | Admitting: Family Medicine

## 2015-12-15 DIAGNOSIS — H8111 Benign paroxysmal vertigo, right ear: Secondary | ICD-10-CM | POA: Diagnosis not present

## 2015-12-15 DIAGNOSIS — M25559 Pain in unspecified hip: Secondary | ICD-10-CM | POA: Diagnosis not present

## 2015-12-15 DIAGNOSIS — S060X9S Concussion with loss of consciousness of unspecified duration, sequela: Secondary | ICD-10-CM | POA: Diagnosis not present

## 2015-12-15 NOTE — Telephone Encounter (Signed)
I am willing to help if I can get her insurance to agree but she should have her [facility] doctor fax [place] over an order for what exactly he wants ordered and Diagnostic code he uses so I can get him the MRI he wants. Our fax is [phone].

## 2015-12-15 NOTE — Telephone Encounter (Signed)
Caller name: Self  Can be reached: [phone]   Reason for call: Patient states that she needs to get an MRI of Head as requested by [facility]  Dr. Dione Booze, MD . Phone number [phone]. Patient wants to have MRI done here instead of going back to [place], [place] to get it.

## 2015-12-15 NOTE — Telephone Encounter (Signed)
Called the patient informed of PCP instructions regarding MRI. The patient did verbalize understanding and agreed to do. The patient wanted  To know if you were taking new patients , her sister is presently seeing Dr. Shawna Orleans and states she needs an MD who is more available.

## 2015-12-22 DIAGNOSIS — H8111 Benign paroxysmal vertigo, right ear: Secondary | ICD-10-CM | POA: Diagnosis not present

## 2015-12-22 DIAGNOSIS — S060X9S Concussion with loss of consciousness of unspecified duration, sequela: Secondary | ICD-10-CM | POA: Diagnosis not present

## 2015-12-22 DIAGNOSIS — M25559 Pain in unspecified hip: Secondary | ICD-10-CM | POA: Diagnosis not present

## 2015-12-26 ENCOUNTER — Other Ambulatory Visit: Payer: Self-pay | Admitting: Family Medicine

## 2015-12-26 DIAGNOSIS — H8110 Benign paroxysmal vertigo, unspecified ear: Secondary | ICD-10-CM

## 2015-12-26 DIAGNOSIS — R270 Ataxia, unspecified: Secondary | ICD-10-CM

## 2015-12-29 DIAGNOSIS — S060X9S Concussion with loss of consciousness of unspecified duration, sequela: Secondary | ICD-10-CM | POA: Diagnosis not present

## 2015-12-29 DIAGNOSIS — M25559 Pain in unspecified hip: Secondary | ICD-10-CM | POA: Diagnosis not present

## 2015-12-29 DIAGNOSIS — H8111 Benign paroxysmal vertigo, right ear: Secondary | ICD-10-CM | POA: Diagnosis not present

## 2016-01-01 ENCOUNTER — Ambulatory Visit (HOSPITAL_BASED_OUTPATIENT_CLINIC_OR_DEPARTMENT_OTHER)
Admission: RE | Admit: 2016-01-01 | Discharge: 2016-01-01 | Disposition: A | Discharge status: Home / Self Care | Payer: Medicare Other | Source: Ambulatory Visit | Attending: Family Medicine | Admitting: Family Medicine

## 2016-01-01 DIAGNOSIS — R270 Ataxia, unspecified: Secondary | ICD-10-CM | POA: Insufficient documentation

## 2016-01-01 DIAGNOSIS — R410 Disorientation, unspecified: Secondary | ICD-10-CM | POA: Insufficient documentation

## 2016-01-01 DIAGNOSIS — S0990XA Unspecified injury of head, initial encounter: Secondary | ICD-10-CM | POA: Diagnosis not present

## 2016-01-01 DIAGNOSIS — H8110 Benign paroxysmal vertigo, unspecified ear: Secondary | ICD-10-CM | POA: Diagnosis not present

## 2016-01-01 DIAGNOSIS — R42 Dizziness and giddiness: Secondary | ICD-10-CM | POA: Diagnosis not present

## 2016-01-05 DIAGNOSIS — S060X9S Concussion with loss of consciousness of unspecified duration, sequela: Secondary | ICD-10-CM | POA: Diagnosis not present

## 2016-01-05 DIAGNOSIS — H8111 Benign paroxysmal vertigo, right ear: Secondary | ICD-10-CM | POA: Diagnosis not present

## 2016-01-05 DIAGNOSIS — M25559 Pain in unspecified hip: Secondary | ICD-10-CM | POA: Diagnosis not present

## 2016-01-12 DIAGNOSIS — M25559 Pain in unspecified hip: Secondary | ICD-10-CM | POA: Diagnosis not present

## 2016-01-12 DIAGNOSIS — H8111 Benign paroxysmal vertigo, right ear: Secondary | ICD-10-CM | POA: Diagnosis not present

## 2016-01-12 DIAGNOSIS — S060X9S Concussion with loss of consciousness of unspecified duration, sequela: Secondary | ICD-10-CM | POA: Diagnosis not present

## 2016-01-14 ENCOUNTER — Other Ambulatory Visit: Payer: Self-pay | Admitting: Family Medicine

## 2016-01-19 DIAGNOSIS — S060X9S Concussion with loss of consciousness of unspecified duration, sequela: Secondary | ICD-10-CM | POA: Diagnosis not present

## 2016-01-19 DIAGNOSIS — H8111 Benign paroxysmal vertigo, right ear: Secondary | ICD-10-CM | POA: Diagnosis not present

## 2016-01-19 DIAGNOSIS — M25559 Pain in unspecified hip: Secondary | ICD-10-CM | POA: Diagnosis not present

## 2016-01-25 DIAGNOSIS — S060X9S Concussion with loss of consciousness of unspecified duration, sequela: Secondary | ICD-10-CM | POA: Diagnosis not present

## 2016-01-25 DIAGNOSIS — H8111 Benign paroxysmal vertigo, right ear: Secondary | ICD-10-CM | POA: Diagnosis not present

## 2016-01-25 DIAGNOSIS — M25559 Pain in unspecified hip: Secondary | ICD-10-CM | POA: Diagnosis not present

## 2016-01-27 ENCOUNTER — Encounter: Payer: Self-pay | Admitting: Family Medicine

## 2016-01-27 ENCOUNTER — Ambulatory Visit (INDEPENDENT_AMBULATORY_CARE_PROVIDER_SITE_OTHER): Payer: Medicare Other | Admitting: Family Medicine

## 2016-01-27 VITALS — BP 144/77 | HR 80 | Temp 98.5°F | Ht 61.0 in | Wt 157.0 lb

## 2016-01-27 DIAGNOSIS — K219 Gastro-esophageal reflux disease without esophagitis: Secondary | ICD-10-CM | POA: Diagnosis not present

## 2016-01-27 DIAGNOSIS — I10 Essential (primary) hypertension: Secondary | ICD-10-CM

## 2016-01-27 DIAGNOSIS — I2699 Other pulmonary embolism without acute cor pulmonale: Secondary | ICD-10-CM

## 2016-01-27 DIAGNOSIS — E782 Mixed hyperlipidemia: Secondary | ICD-10-CM

## 2016-01-27 DIAGNOSIS — E119 Type 2 diabetes mellitus without complications: Secondary | ICD-10-CM | POA: Diagnosis not present

## 2016-01-27 DIAGNOSIS — E039 Hypothyroidism, unspecified: Secondary | ICD-10-CM

## 2016-01-27 DIAGNOSIS — J449 Chronic obstructive pulmonary disease, unspecified: Secondary | ICD-10-CM

## 2016-01-27 MED ORDER — LORAZEPAM 0.5 MG PO TABS: 0.5000 mg | ORAL_TABLET | Freq: Two times a day (BID) | ORAL | Status: DC | PRN

## 2016-01-27 MED ORDER — LOSARTAN POTASSIUM 50 MG PO TABS: 50.0000 mg | ORAL_TABLET | Freq: Every day | ORAL | Status: DC

## 2016-01-27 MED ORDER — LOSARTAN POTASSIUM 100 MG PO TABS: 100.0000 mg | ORAL_TABLET | Freq: Every day | ORAL | Status: DC

## 2016-01-27 NOTE — Patient Instructions (Addendum)
Start a Multivitamin with minerals or zinc and biotin  Needs lab appt for next tuesday    Hypertension Hypertension, commonly called high blood pressure, is when the force of blood pumping through your arteries is too strong. Your arteries are the blood vessels that carry blood from your heart throughout your body. A blood pressure reading consists of a higher number over a lower number, such as 110/72. The higher number (systolic) is the pressure inside your arteries when your heart pumps. The lower number (diastolic) is the pressure inside your arteries when your heart relaxes. Ideally you want your blood pressure below 120/80. Hypertension forces your heart to work harder to pump blood. Your arteries may become narrow or stiff. Having untreated or uncontrolled hypertension can cause heart attack, stroke, kidney disease, and other problems. RISK FACTORS Some risk factors for high blood pressure are controllable. Others are not.  Risk factors you cannot control include:   Race. You may be at higher risk if you are African American.  Age. Risk increases with age.  Gender. Men are at higher risk than women before age 37 years. After age 25, women are at higher risk than men. Risk factors you can control include:  Not getting enough exercise or physical activity.  Being overweight.  Getting too much fat, sugar, calories, or salt in your diet.  Drinking too much alcohol. SIGNS AND SYMPTOMS Hypertension does not usually cause signs or symptoms. Extremely high blood pressure (hypertensive crisis) may cause headache, anxiety, shortness of breath, and nosebleed. DIAGNOSIS To check if you have hypertension, your health care provider will measure your blood pressure while you are seated, with your arm held at the level of your heart. It should be measured at least twice using the same arm. Certain conditions can cause a difference in blood pressure between your right and left arms. A blood  pressure reading that is higher than normal on one occasion does not mean that you need treatment. If it is not clear whether you have high blood pressure, you may be asked to return on a different day to have your blood pressure checked again. Or, you may be asked to monitor your blood pressure at home for 1 or more weeks. TREATMENT Treating high blood pressure includes making lifestyle changes and possibly taking medicine. Living a healthy lifestyle can help lower high blood pressure. You may need to change some of your habits. Lifestyle changes may include:  Following the DASH diet. This diet is high in fruits, vegetables, and whole grains. It is low in salt, red meat, and added sugars.  Keep your sodium intake below 2,300 mg per day.  Getting at least 30-45 minutes of aerobic exercise at least 4 times per week.  Losing weight if necessary.  Not smoking.  Limiting alcoholic beverages.  Learning ways to reduce stress. Your health care provider may prescribe medicine if lifestyle changes are not enough to get your blood pressure under control, and if one of the following is true:  You are 39-49 years of age and your systolic blood pressure is above 140.  You are 84 years of age or older, and your systolic blood pressure is above 150.  Your diastolic blood pressure is above 90.  You have diabetes, and your systolic blood pressure is over [id number] or your diastolic blood pressure is over 90.  You have kidney disease and your blood pressure is above 140/90.  You have heart disease and your blood pressure is above 140/90. Your  personal target blood pressure may vary depending on your medical conditions, your age, and other factors. HOME CARE INSTRUCTIONS  Have your blood pressure rechecked as directed by your health care provider.   Take medicines only as directed by your health care provider. Follow the directions carefully. Blood pressure medicines must be taken as prescribed. The  medicine does not work as well when you skip doses. Skipping doses also puts you at risk for problems.  Do not smoke.   Monitor your blood pressure at home as directed by your health care provider. SEEK MEDICAL CARE IF:   You think you are having a reaction to medicines taken.  You have recurrent headaches or feel dizzy.  You have swelling in your ankles.  You have trouble with your vision. SEEK IMMEDIATE MEDICAL CARE IF:  You develop a severe headache or confusion.  You have unusual weakness, numbness, or feel faint.  You have severe chest or abdominal pain.  You vomit repeatedly.  You have trouble breathing. MAKE SURE YOU:   Understand these instructions.  Will watch your condition.  Will get help right away if you are not doing well or get worse.   This information is not intended to replace advice given to you by your health care provider. Make sure you discuss any questions you have with your health care provider.   Document Released: 10/29/2005 Document Revised: 03/15/2015 Document Reviewed: 08/21/2013 Elsevier Interactive Patient Education [employer].

## 2016-01-27 NOTE — Progress Notes (Signed)
Pre visit review using our clinic review tool, if applicable. No additional management support is needed unless otherwise documented below in the visit note. 
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
## 2016-01-29 NOTE — Assessment & Plan Note (Signed)
hgba1c acceptable, minimize simple carbs. Increase exercise as tolerated.  

## 2016-01-29 NOTE — Assessment & Plan Note (Signed)
Avoid offending foods, start probiotics. Do not eat large meals in late evening and consider raising head of bed.  

## 2016-01-29 NOTE — Assessment & Plan Note (Signed)
No recent exacerbations.  

## 2016-01-29 NOTE — Assessment & Plan Note (Signed)
Well controlled, no changes to meds. Encouraged heart healthy diet such as the DASH diet and exercise as tolerated.  °
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
## 2016-01-31 ENCOUNTER — Other Ambulatory Visit (INDEPENDENT_AMBULATORY_CARE_PROVIDER_SITE_OTHER): Payer: Medicare Other

## 2016-01-31 DIAGNOSIS — E782 Mixed hyperlipidemia: Secondary | ICD-10-CM | POA: Diagnosis not present

## 2016-01-31 DIAGNOSIS — K219 Gastro-esophageal reflux disease without esophagitis: Secondary | ICD-10-CM

## 2016-01-31 DIAGNOSIS — E039 Hypothyroidism, unspecified: Secondary | ICD-10-CM | POA: Diagnosis not present

## 2016-01-31 DIAGNOSIS — I10 Essential (primary) hypertension: Secondary | ICD-10-CM

## 2016-01-31 DIAGNOSIS — E119 Type 2 diabetes mellitus without complications: Secondary | ICD-10-CM | POA: Diagnosis not present

## 2016-01-31 DIAGNOSIS — I2699 Other pulmonary embolism without acute cor pulmonale: Secondary | ICD-10-CM

## 2016-02-01 LAB — CBC
HEMATOCRIT: 38.5 % (ref 36.0–46.0)
HEMOGLOBIN: 13.1 g/dL (ref 12.0–15.0)
MCHC: 34.1 g/dL (ref 30.0–36.0)
MCV: 91.9 fl (ref 78.0–100.0)
Platelets: 209 10*3/uL (ref 150.0–400.0)
RBC: 4.19 Mil/uL (ref 3.87–5.11)
RDW: 13.8 % (ref 11.5–15.5)
WBC: 7.2 10*3/uL (ref 4.0–10.5)

## 2016-02-01 LAB — TSH: TSH: 2.96 u[IU]/mL (ref 0.35–4.50)

## 2016-02-01 LAB — COMPREHENSIVE METABOLIC PANEL
ALBUMIN: 4.3 g/dL (ref 3.5–5.2)
ALT: 7 U/L (ref 0–35)
AST: 20 U/L (ref 0–37)
Alkaline Phosphatase: 85 U/L (ref 39–117)
BILIRUBIN TOTAL: 0.5 mg/dL (ref 0.2–1.2)
BUN: 18 mg/dL (ref 6–23)
CALCIUM: 10.9 mg/dL — AB (ref 8.4–10.5)
CO2: 33 mEq/L — ABNORMAL HIGH (ref 19–32)
CREATININE: 1.27 mg/dL — AB (ref 0.40–1.20)
Chloride: 103 mEq/L (ref 96–112)
GFR: 42.81 mL/min — ABNORMAL LOW (ref >60.00)
Glucose, Bld: 125 mg/dL — ABNORMAL HIGH (ref 70–99)
Potassium: 4.7 mEq/L (ref 3.5–5.1)
Sodium: 143 mEq/L (ref 135–145)
Total Protein: 7.4 g/dL (ref 6.0–8.3)

## 2016-02-01 LAB — HEMOGLOBIN A1C: HEMOGLOBIN A1C: 6 % (ref 4.6–6.5)

## 2016-02-01 LAB — LIPID PANEL
CHOL/HDL RATIO: 4
CHOLESTEROL: 207 mg/dL — AB (ref 0–200)
HDL: 47.1 mg/dL (ref >39.00)
NONHDL: 160.17
TRIGLYCERIDES: 248 mg/dL — AB (ref 0.0–149.0)
VLDL: 49.6 mg/dL — AB (ref 0.0–40.0)

## 2016-02-01 LAB — MICROALBUMIN / CREATININE URINE RATIO
Creatinine,U: 59.3 mg/dL
MICROALB UR: 0.9 mg/dL (ref 0.0–1.9)
Microalb Creat Ratio: 1.5 mg/g (ref 0.0–30.0)

## 2016-02-01 LAB — LDL CHOLESTEROL, DIRECT: LDL DIRECT: 112 mg/dL

## 2016-02-02 ENCOUNTER — Other Ambulatory Visit: Payer: Self-pay | Admitting: Family Medicine

## 2016-02-02 DIAGNOSIS — H8111 Benign paroxysmal vertigo, right ear: Secondary | ICD-10-CM | POA: Diagnosis not present

## 2016-02-02 DIAGNOSIS — E119 Type 2 diabetes mellitus without complications: Secondary | ICD-10-CM

## 2016-02-02 DIAGNOSIS — D6489 Other specified anemias: Secondary | ICD-10-CM

## 2016-02-02 DIAGNOSIS — K2100 Gastro-esophageal reflux disease with esophagitis, without bleeding: Secondary | ICD-10-CM

## 2016-02-02 DIAGNOSIS — M25559 Pain in unspecified hip: Secondary | ICD-10-CM | POA: Diagnosis not present

## 2016-02-02 DIAGNOSIS — Z23 Encounter for immunization: Secondary | ICD-10-CM

## 2016-02-02 DIAGNOSIS — R0600 Dyspnea, unspecified: Secondary | ICD-10-CM

## 2016-02-02 DIAGNOSIS — S060X9S Concussion with loss of consciousness of unspecified duration, sequela: Secondary | ICD-10-CM | POA: Diagnosis not present

## 2016-02-02 DIAGNOSIS — H8110 Benign paroxysmal vertigo, unspecified ear: Secondary | ICD-10-CM

## 2016-02-02 DIAGNOSIS — K5900 Constipation, unspecified: Secondary | ICD-10-CM

## 2016-02-02 DIAGNOSIS — R0602 Shortness of breath: Secondary | ICD-10-CM

## 2016-02-02 DIAGNOSIS — I10 Essential (primary) hypertension: Secondary | ICD-10-CM

## 2016-02-02 MED ORDER — PROPRANOLOL HCL ER 60 MG PO CP24: 60.0000 mg | ORAL_CAPSULE | Freq: Every day | ORAL | Status: DC

## 2016-02-02 MED ORDER — FLUTICASONE PROPIONATE 50 MCG/ACT NA SUSP: 2.0000 | Freq: Every day | NASAL | Status: DC | PRN

## 2016-02-02 MED ORDER — FUROSEMIDE 40 MG PO TABS: 40.0000 mg | ORAL_TABLET | Freq: Every day | ORAL | Status: DC

## 2016-02-02 MED ORDER — LEVOTHYROXINE SODIUM 50 MCG PO TABS: 50.0000 ug | ORAL_TABLET | Freq: Every day | ORAL | Status: DC

## 2016-02-02 MED ORDER — GABAPENTIN 300 MG PO CAPS: 300.0000 mg | ORAL_CAPSULE | Freq: Every day | ORAL | Status: DC

## 2016-02-02 MED ORDER — SUCRALFATE 1 G PO TABS: 1.0000 g | ORAL_TABLET | Freq: Four times a day (QID) | ORAL | Status: DC

## 2016-02-07 DIAGNOSIS — S060X9S Concussion with loss of consciousness of unspecified duration, sequela: Secondary | ICD-10-CM | POA: Diagnosis not present

## 2016-02-07 DIAGNOSIS — M25559 Pain in unspecified hip: Secondary | ICD-10-CM | POA: Diagnosis not present

## 2016-02-07 DIAGNOSIS — H8111 Benign paroxysmal vertigo, right ear: Secondary | ICD-10-CM | POA: Diagnosis not present

## 2016-02-09 DIAGNOSIS — M25559 Pain in unspecified hip: Secondary | ICD-10-CM | POA: Diagnosis not present

## 2016-02-09 DIAGNOSIS — H8111 Benign paroxysmal vertigo, right ear: Secondary | ICD-10-CM | POA: Diagnosis not present

## 2016-02-09 DIAGNOSIS — S060X9S Concussion with loss of consciousness of unspecified duration, sequela: Secondary | ICD-10-CM | POA: Diagnosis not present

## 2016-02-15 ENCOUNTER — Other Ambulatory Visit: Payer: Self-pay | Admitting: Family Medicine

## 2016-02-27 ENCOUNTER — Telehealth: Payer: Self-pay | Admitting: *Deleted

## 2016-02-27 NOTE — Telephone Encounter (Signed)
Pt signed ROI received via mail from [place] at [employer]. Forwarded to [place] to scan/email to medical records. [id number]//[id number]

## 2016-02-28 DIAGNOSIS — Z7951 Long term (current) use of inhaled steroids: Secondary | ICD-10-CM | POA: Diagnosis not present

## 2016-02-28 DIAGNOSIS — Z79891 Long term (current) use of opiate analgesic: Secondary | ICD-10-CM | POA: Diagnosis not present

## 2016-02-28 DIAGNOSIS — K2950 Unspecified chronic gastritis without bleeding: Secondary | ICD-10-CM | POA: Diagnosis not present

## 2016-02-28 DIAGNOSIS — K219 Gastro-esophageal reflux disease without esophagitis: Secondary | ICD-10-CM | POA: Diagnosis not present

## 2016-02-28 DIAGNOSIS — R1013 Epigastric pain: Secondary | ICD-10-CM | POA: Diagnosis not present

## 2016-02-28 DIAGNOSIS — Z7982 Long term (current) use of aspirin: Secondary | ICD-10-CM | POA: Diagnosis not present

## 2016-02-28 DIAGNOSIS — Z79899 Other long term (current) drug therapy: Secondary | ICD-10-CM | POA: Diagnosis not present

## 2016-03-05 ENCOUNTER — Other Ambulatory Visit (INDEPENDENT_AMBULATORY_CARE_PROVIDER_SITE_OTHER): Payer: Medicare Other

## 2016-03-06 ENCOUNTER — Other Ambulatory Visit: Payer: Self-pay | Admitting: Family Medicine

## 2016-03-06 DIAGNOSIS — N289 Disorder of kidney and ureter, unspecified: Secondary | ICD-10-CM

## 2016-03-06 LAB — COMPREHENSIVE METABOLIC PANEL
ALK PHOS: 82 U/L (ref 39–117)
ALT: 7 U/L (ref 0–35)
AST: 21 U/L (ref 0–37)
Albumin: 4.2 g/dL (ref 3.5–5.2)
BILIRUBIN TOTAL: 0.6 mg/dL (ref 0.2–1.2)
BUN: 26 mg/dL — ABNORMAL HIGH (ref 6–23)
CALCIUM: 9.9 mg/dL (ref 8.4–10.5)
CO2: 33 mEq/L — ABNORMAL HIGH (ref 19–32)
Chloride: 99 mEq/L (ref 96–112)
Creatinine, Ser: 1.39 mg/dL — ABNORMAL HIGH (ref 0.40–1.20)
GFR: 38.57 mL/min — AB (ref >60.00)
GLUCOSE: 96 mg/dL (ref 70–99)
Potassium: 4.6 mEq/L (ref 3.5–5.1)
SODIUM: 139 meq/L (ref 135–145)
TOTAL PROTEIN: 7.1 g/dL (ref 6.0–8.3)

## 2016-03-16 DIAGNOSIS — R2689 Other abnormalities of gait and mobility: Secondary | ICD-10-CM | POA: Diagnosis not present

## 2016-03-16 DIAGNOSIS — G629 Polyneuropathy, unspecified: Secondary | ICD-10-CM | POA: Diagnosis not present

## 2016-03-16 DIAGNOSIS — G4730 Sleep apnea, unspecified: Secondary | ICD-10-CM | POA: Diagnosis not present

## 2016-03-16 DIAGNOSIS — R42 Dizziness and giddiness: Secondary | ICD-10-CM | POA: Diagnosis not present

## 2016-03-16 DIAGNOSIS — Z87828 Personal history of other (healed) physical injury and trauma: Secondary | ICD-10-CM | POA: Diagnosis not present

## 2016-03-16 DIAGNOSIS — G2581 Restless legs syndrome: Secondary | ICD-10-CM | POA: Diagnosis not present

## 2016-03-16 DIAGNOSIS — G4710 Hypersomnia, unspecified: Secondary | ICD-10-CM | POA: Diagnosis not present

## 2016-03-27 ENCOUNTER — Other Ambulatory Visit (INDEPENDENT_AMBULATORY_CARE_PROVIDER_SITE_OTHER): Payer: Medicare Other

## 2016-03-27 DIAGNOSIS — N289 Disorder of kidney and ureter, unspecified: Secondary | ICD-10-CM

## 2016-03-27 LAB — COMPREHENSIVE METABOLIC PANEL
ALK PHOS: 77 U/L (ref 39–117)
ALT: 9 U/L (ref 0–35)
AST: 22 U/L (ref 0–37)
Albumin: 4.3 g/dL (ref 3.5–5.2)
BILIRUBIN TOTAL: 0.6 mg/dL (ref 0.2–1.2)
BUN: 13 mg/dL (ref 6–23)
CALCIUM: 10.3 mg/dL (ref 8.4–10.5)
CO2: 34 meq/L — AB (ref 19–32)
Chloride: 104 mEq/L (ref 96–112)
Creatinine, Ser: 1 mg/dL (ref 0.40–1.20)
GFR: 56.39 mL/min — AB (ref >60.00)
Glucose, Bld: 135 mg/dL — ABNORMAL HIGH (ref 70–99)
Potassium: 4.3 mEq/L (ref 3.5–5.1)
Sodium: 142 mEq/L (ref 135–145)
TOTAL PROTEIN: 6.9 g/dL (ref 6.0–8.3)

## 2016-03-28 ENCOUNTER — Other Ambulatory Visit: Payer: Self-pay | Admitting: Family Medicine

## 2016-03-29 DIAGNOSIS — I130 Hypertensive heart and chronic kidney disease with heart failure and stage 1 through stage 4 chronic kidney disease, or unspecified chronic kidney disease: Secondary | ICD-10-CM | POA: Diagnosis not present

## 2016-03-29 DIAGNOSIS — I509 Heart failure, unspecified: Secondary | ICD-10-CM | POA: Diagnosis not present

## 2016-03-29 DIAGNOSIS — J449 Chronic obstructive pulmonary disease, unspecified: Secondary | ICD-10-CM | POA: Diagnosis not present

## 2016-03-29 DIAGNOSIS — R2689 Other abnormalities of gait and mobility: Secondary | ICD-10-CM | POA: Diagnosis not present

## 2016-03-29 DIAGNOSIS — M109 Gout, unspecified: Secondary | ICD-10-CM | POA: Diagnosis not present

## 2016-03-29 DIAGNOSIS — Z79899 Other long term (current) drug therapy: Secondary | ICD-10-CM | POA: Diagnosis not present

## 2016-03-29 DIAGNOSIS — Z86711 Personal history of pulmonary embolism: Secondary | ICD-10-CM | POA: Diagnosis not present

## 2016-03-29 DIAGNOSIS — E039 Hypothyroidism, unspecified: Secondary | ICD-10-CM | POA: Diagnosis not present

## 2016-03-29 DIAGNOSIS — Z888 Allergy status to other drugs, medicaments and biological substances status: Secondary | ICD-10-CM | POA: Diagnosis not present

## 2016-03-29 DIAGNOSIS — N183 Chronic kidney disease, stage 3 (moderate): Secondary | ICD-10-CM | POA: Diagnosis not present

## 2016-03-29 DIAGNOSIS — Z87891 Personal history of nicotine dependence: Secondary | ICD-10-CM | POA: Diagnosis not present

## 2016-03-29 DIAGNOSIS — I2510 Atherosclerotic heart disease of native coronary artery without angina pectoris: Secondary | ICD-10-CM | POA: Diagnosis not present

## 2016-03-29 DIAGNOSIS — Z7982 Long term (current) use of aspirin: Secondary | ICD-10-CM | POA: Diagnosis not present

## 2016-04-02 ENCOUNTER — Telehealth: Payer: Self-pay | Admitting: Family Medicine

## 2016-04-02 NOTE — Telephone Encounter (Signed)
OK to refill requested med with 1 rf

## 2016-04-02 NOTE — Telephone Encounter (Signed)
Relation to [id number] Call back number:[phone] Pharmacy: [place], [place] [phone] (Phone) [phone] (Fax)         Reason for call:  Patient requesting a refill LORazepam (ATIVAN) 0.5 MG tablet, patient states [facility] never received Rx from 01/27/16. Please advise

## 2016-04-02 NOTE — Telephone Encounter (Signed)
Advise on refill Last refilled on 01/27/16  #60 with 1 refill.

## 2016-04-03 MED ORDER — LORAZEPAM 0.5 MG PO TABS: 0.5000 mg | ORAL_TABLET | Freq: Two times a day (BID) | ORAL | Status: DC | PRN

## 2016-04-03 NOTE — Telephone Encounter (Signed)
Printed as instructed. Called the patient to inform prescription refilled and [facility] HAD TO LEAVE A DETAILED MESSAGE FOR THIS PATIENT.

## 2016-04-17 DIAGNOSIS — H8112 Benign paroxysmal vertigo, left ear: Secondary | ICD-10-CM | POA: Diagnosis not present

## 2016-04-17 DIAGNOSIS — R1110 Vomiting, unspecified: Secondary | ICD-10-CM | POA: Diagnosis not present

## 2016-04-17 DIAGNOSIS — I10 Essential (primary) hypertension: Secondary | ICD-10-CM | POA: Diagnosis not present

## 2016-04-17 DIAGNOSIS — J45909 Unspecified asthma, uncomplicated: Secondary | ICD-10-CM | POA: Diagnosis not present

## 2016-04-17 DIAGNOSIS — I509 Heart failure, unspecified: Secondary | ICD-10-CM | POA: Diagnosis not present

## 2016-04-17 DIAGNOSIS — R490 Dysphonia: Secondary | ICD-10-CM | POA: Diagnosis not present

## 2016-04-17 DIAGNOSIS — J449 Chronic obstructive pulmonary disease, unspecified: Secondary | ICD-10-CM | POA: Diagnosis not present

## 2016-04-17 DIAGNOSIS — R0602 Shortness of breath: Secondary | ICD-10-CM | POA: Diagnosis not present

## 2016-04-17 DIAGNOSIS — R2689 Other abnormalities of gait and mobility: Secondary | ICD-10-CM | POA: Diagnosis not present

## 2016-04-17 DIAGNOSIS — K219 Gastro-esophageal reflux disease without esophagitis: Secondary | ICD-10-CM | POA: Diagnosis not present

## 2016-04-17 DIAGNOSIS — R05 Cough: Secondary | ICD-10-CM | POA: Diagnosis not present

## 2016-04-18 DIAGNOSIS — J449 Chronic obstructive pulmonary disease, unspecified: Secondary | ICD-10-CM | POA: Diagnosis not present

## 2016-04-18 DIAGNOSIS — R6889 Other general symptoms and signs: Secondary | ICD-10-CM | POA: Diagnosis not present

## 2016-04-18 DIAGNOSIS — E039 Hypothyroidism, unspecified: Secondary | ICD-10-CM | POA: Diagnosis not present

## 2016-04-18 DIAGNOSIS — R05 Cough: Secondary | ICD-10-CM | POA: Diagnosis not present

## 2016-04-18 DIAGNOSIS — Z7982 Long term (current) use of aspirin: Secondary | ICD-10-CM | POA: Diagnosis not present

## 2016-04-18 DIAGNOSIS — R0982 Postnasal drip: Secondary | ICD-10-CM | POA: Diagnosis not present

## 2016-04-18 DIAGNOSIS — Z7951 Long term (current) use of inhaled steroids: Secondary | ICD-10-CM | POA: Diagnosis not present

## 2016-04-18 DIAGNOSIS — Z888 Allergy status to other drugs, medicaments and biological substances status: Secondary | ICD-10-CM | POA: Diagnosis not present

## 2016-04-18 DIAGNOSIS — I129 Hypertensive chronic kidney disease with stage 1 through stage 4 chronic kidney disease, or unspecified chronic kidney disease: Secondary | ICD-10-CM | POA: Diagnosis not present

## 2016-04-18 DIAGNOSIS — I2510 Atherosclerotic heart disease of native coronary artery without angina pectoris: Secondary | ICD-10-CM | POA: Diagnosis not present

## 2016-04-18 DIAGNOSIS — N183 Chronic kidney disease, stage 3 (moderate): Secondary | ICD-10-CM | POA: Diagnosis not present

## 2016-04-18 DIAGNOSIS — Z87891 Personal history of nicotine dependence: Secondary | ICD-10-CM | POA: Diagnosis not present

## 2016-04-18 DIAGNOSIS — Z79899 Other long term (current) drug therapy: Secondary | ICD-10-CM | POA: Diagnosis not present

## 2016-04-18 DIAGNOSIS — R062 Wheezing: Secondary | ICD-10-CM | POA: Diagnosis not present

## 2016-04-18 DIAGNOSIS — R0602 Shortness of breath: Secondary | ICD-10-CM | POA: Diagnosis not present

## 2016-04-25 DIAGNOSIS — G4733 Obstructive sleep apnea (adult) (pediatric): Secondary | ICD-10-CM | POA: Diagnosis not present

## 2016-04-30 ENCOUNTER — Encounter: Payer: Self-pay | Admitting: Family Medicine

## 2016-04-30 ENCOUNTER — Ambulatory Visit (INDEPENDENT_AMBULATORY_CARE_PROVIDER_SITE_OTHER): Payer: Medicare Other | Admitting: Family Medicine

## 2016-04-30 VITALS — BP 122/78 | HR 69 | Temp 98.2°F | Ht 61.0 in | Wt 161.0 lb

## 2016-04-30 DIAGNOSIS — I10 Essential (primary) hypertension: Secondary | ICD-10-CM

## 2016-04-30 DIAGNOSIS — R739 Hyperglycemia, unspecified: Secondary | ICD-10-CM

## 2016-04-30 DIAGNOSIS — M109 Gout, unspecified: Secondary | ICD-10-CM

## 2016-04-30 DIAGNOSIS — E782 Mixed hyperlipidemia: Secondary | ICD-10-CM

## 2016-04-30 DIAGNOSIS — K219 Gastro-esophageal reflux disease without esophagitis: Secondary | ICD-10-CM

## 2016-04-30 DIAGNOSIS — H109 Unspecified conjunctivitis: Secondary | ICD-10-CM

## 2016-04-30 DIAGNOSIS — E039 Hypothyroidism, unspecified: Secondary | ICD-10-CM | POA: Diagnosis not present

## 2016-04-30 DIAGNOSIS — Z23 Encounter for immunization: Secondary | ICD-10-CM | POA: Diagnosis not present

## 2016-04-30 DIAGNOSIS — R5382 Chronic fatigue, unspecified: Secondary | ICD-10-CM

## 2016-04-30 DIAGNOSIS — E119 Type 2 diabetes mellitus without complications: Secondary | ICD-10-CM

## 2016-04-30 MED ORDER — POLYMYXIN B-TRIMETHOPRIM 10000-0.1 UNIT/ML-% OP SOLN: 1.0000 [drp] | Freq: Four times a day (QID) | OPHTHALMIC | Status: DC

## 2016-04-30 MED ORDER — ALPRAZOLAM 0.5 MG PO TABS: 0.5000 mg | ORAL_TABLET | Freq: Every evening | ORAL | Status: DC | PRN

## 2016-04-30 NOTE — Assessment & Plan Note (Signed)
Doing well on Ranitidine, may continue the same, is not needing Lansoprazole. Avoid offending foods, start probiotics. Do not eat large meals in late evening and consider raising head of bed.

## 2016-04-30 NOTE — Assessment & Plan Note (Signed)
Well controlled, no changes to meds. Encouraged heart healthy diet such as the DASH diet and exercise as tolerated.  °

## 2016-04-30 NOTE — Assessment & Plan Note (Signed)
Tolerating Uloric, will check uric acid level with  Next visit

## 2016-04-30 NOTE — Assessment & Plan Note (Signed)
On Levothyroxine, continue to monitor 

## 2016-04-30 NOTE — Progress Notes (Signed)
Pre visit review using our clinic review tool, if applicable. No additional management support is needed unless otherwise documented below in the visit note. 

## 2016-04-30 NOTE — Assessment & Plan Note (Signed)
Tolerating statin, encouraged heart healthy diet, avoid trans fats, minimize simple carbs and saturated fats. Increase exercise as tolerated 

## 2016-04-30 NOTE — Patient Instructions (Signed)

## 2016-05-06 DIAGNOSIS — H109 Unspecified conjunctivitis: Secondary | ICD-10-CM | POA: Insufficient documentation

## 2016-05-06 DIAGNOSIS — R739 Hyperglycemia, unspecified: Secondary | ICD-10-CM | POA: Insufficient documentation

## 2016-05-06 NOTE — Assessment & Plan Note (Signed)
hgba1c acceptable, minimize simple carbs. Increase exercise as tolerated. Continue current meds 

## 2016-05-06 NOTE — Assessment & Plan Note (Signed)
Moist hot compresses tid and consider polytrim if symptoms persist.

## 2016-05-06 NOTE — Assessment & Plan Note (Signed)
minimize simple carbs. Increase exercise as tolerated.  

## 2016-05-06 NOTE — Progress Notes (Signed)
Patient ID: Christina Mckinney, female   DOB: Apr 23, 1932, 82 y.o.   MRN: [id number]   Subjective:    Patient ID: Christina Mckinney, female    DOB: Mar 21, 1932, 82 y.o.   MRN: [id number]  Chief Complaint  Patient presents with  . Follow-up    HPI Patient is in today for follow up. No recent illness but she is noting some some drainage from her eyes for the past 1-2 days. No pain or visual changes. No acute concerns at this time otherwise. Denies CP/palp/SOB/HA/congestion/fevers/GI or GU c/o. Taking meds as prescribed  Past Medical History  Diagnosis Date  . Depressive disorder, not elsewhere classified   . Diverticulitis   . GERD (gastroesophageal reflux disease)   . Other and unspecified hyperlipidemia   . Unspecified essential hypertension   . Other abnormal glucose   . Unspecified hypothyroidism   . Personal history of colonic polyps   . Anxiety state, unspecified   . Personal history of peptic ulcer disease   . Osteoarthritis   . Gout, unspecified   . Essential and other specified forms of tremor   . Internal hemorrhoids without mention of complication   . Anemia, unspecified   . Unspecified disorder of bladder   . Pulmonary embolism ([place])   . DVT (deep venous thrombosis) ([address])   . Varicose veins   . Chest pain   . Edema 06/12/2014  . Dyspnea 07/22/2015  . Depression with anxiety 06/02/2007    Qualifier: Diagnosis of  By: Wynona Luna   . Macular degeneration 08/07/2015  . Chronic systolic CHF (congestive heart failure) ([place]) 08/12/2015  . Anxiety and depression 09/18/2015    Past Surgical History  Procedure Laterality Date  . Cholecystectomy    . Abdominal hysterectomy    . Nasal septum surgery    . Shoulder surgery    . Knee arthroscopy      right   . Eye surgery    . Endovenous ablation saphenous vein w/ laser  09-11-2012    left greater saphenous vein   by Curt Jews MD  . Total knee arthroplasty  09/2013    Right    Family History  Problem Relation Age of Onset    . Colon cancer Neg Hx   . Other Mother     varicose veins  . Hip fracture Mother   . Heart disease Father     CHF  . Hyperlipidemia Father   . Heart attack Father   . Diabetes Daughter   . Heart disease Daughter   . Hyperlipidemia Daughter   . Hypertension Daughter   . Aneurysm Daughter   . Cancer Sister     stomach  . Heart disease Sister   . Heart disease Brother     Social History   Social History  . Marital Status: Widowed    Spouse Name: N/A  . Number of Children: 3  . Years of Education: N/A   Occupational History  . Retired     Social History Main Topics  . Smoking status: Former Smoker    Types: Cigarettes    Quit date: 11/12/1988  . Smokeless tobacco: Never Used  . Alcohol Use: 2.4 oz/week    4 Standard drinks or equivalent per week     Comment: occasional wine   . Drug Use: No  . Sexual Activity: No     Comment: lives alone, no dietary restrictions, widowed   Other Topics Concern  . Not on file  Social History Narrative    Outpatient Prescriptions Prior to Visit  Medication Sig Dispense Refill  . aspirin 81 MG tablet Take 1 tablet (81 mg total) by mouth daily. 30 tablet   . azelastine (ASTELIN) 137 MCG/SPRAY nasal spray Place 1 spray into the nose daily as needed. Use in each nostril as directed    . Cholecalciferol (VITAMIN D) 2000 UNITS tablet Take 2,000 Units by mouth daily.      Marland Kitchen co-enzyme Q-10 30 MG capsule Take 30 mg by mouth daily.     . fluticasone (FLONASE) 50 MCG/ACT nasal spray Place 2 sprays into both nostrils daily as needed for allergies or rhinitis. 48 g 1  . fluticasone (FLOVENT HFA) 110 MCG/ACT inhaler Inhale 2 puffs into the lungs 2 (two) times daily. 3 Inhaler 3  . furosemide (LASIX) 40 MG tablet Take 1 tablet (40 mg total) by mouth daily. as directed 90 tablet 1  . gabapentin (NEURONTIN) 300 MG capsule Take 1 capsule (300 mg total) by mouth at bedtime. 90 capsule 1  . guaiFENesin (MUCINEX) 600 MG 12 hr tablet Take 2 tablets  (1,200 mg total) by mouth 2 (two) times daily.    Marland Kitchen levothyroxine (SYNTHROID, LEVOTHROID) 50 MCG tablet Take 1 tablet (50 mcg total) by mouth daily before breakfast. 90 tablet 1  . LORazepam (ATIVAN) 0.5 MG tablet Take 1 tablet (0.5 mg total) by mouth 2 (two) times daily as needed for anxiety. 60 tablet 1  . losartan (COZAAR) 100 MG tablet Take 1 tablet (100 mg total) by mouth daily. 90 tablet 1  . PATADAY 0.2 % SOLN Place 1 drop into both eyes daily.    Marland Kitchen PROAIR HFA 108 (90 Base) MCG/ACT inhaler USE 2 INHALATIONS EVERY 6 HOURS AS NEEDED FOR WHEEZING OR SHORTNESS OF BREATH 25.5 g 1  . propranolol ER (INDERAL LA) 60 MG 24 hr capsule Take 1 capsule (60 mg total) by mouth daily. 90 capsule 1  . RESTASIS 0.05 % ophthalmic emulsion INSTILL ONE DROP IN EACH EYE TWICE A DAY 6 each 2  . sucralfate (CARAFATE) 1 g tablet Take 1 tablet (1 g total) by mouth 4 (four) times daily. 120 tablet 1  . traMADol (ULTRAM) 50 MG tablet Take 1 tablet (50 mg total) by mouth daily as needed. 30 tablet 0  . ULORIC 40 MG tablet TAKE 1 TABLET DAILY 90 tablet 0  . ALPRAZolam (XANAX) 0.5 MG tablet Take 1 tablet (0.5 mg total) by mouth at bedtime as needed for anxiety. 90 tablet 1  . lansoprazole (PREVACID) 30 MG capsule Take 1 capsule (30 mg total) by mouth 2 (two) times daily before a meal. 180 capsule 1   No facility-administered medications prior to visit.    Allergies  Allergen Reactions  . Crestor [Rosuvastatin]     Left hip pain  . Lipitor [Atorvastatin]     Pain in hips  . Red Dye     Review of Systems  Constitutional: Negative for fever and malaise/fatigue.  HENT: Negative for congestion.   Eyes: Positive for discharge. Negative for blurred vision, double vision, photophobia, pain and redness.  Respiratory: Negative for shortness of breath.   Cardiovascular: Negative for chest pain, palpitations and leg swelling.  Gastrointestinal: Negative for nausea, abdominal pain and blood in stool.  Genitourinary:  Negative for dysuria and frequency.  Musculoskeletal: Negative for falls.  Skin: Negative for rash.  Neurological: Negative for dizziness, loss of consciousness and headaches.  Endo/Heme/Allergies: Negative for environmental allergies.  Psychiatric/Behavioral: Negative  for depression. The patient is not nervous/anxious.        Objective:    Physical Exam  Constitutional: She is oriented to person, place, and time. She appears well-developed and well-nourished. No distress.  HENT:  Head: Normocephalic and atraumatic.  Nose: Nose normal.  Eyes: Right eye exhibits no discharge. Left eye exhibits no discharge.  Neck: Normal range of motion. Neck supple.  Cardiovascular: Normal rate and regular rhythm.   No murmur heard. Pulmonary/Chest: Effort normal and breath sounds normal.  Abdominal: Soft. Bowel sounds are normal. There is no tenderness.  Musculoskeletal: She exhibits no edema.  Neurological: She is alert and oriented to person, place, and time.  Skin: Skin is warm and dry.  Psychiatric: She has a normal mood and affect.  Nursing note and vitals reviewed.   BP 122/78 mmHg  Pulse 69  Temp(Src) 98.2 F (36.8 C) (Oral)  Ht 5\' 1"  (1.549 m)  Wt 161 lb (73.029 kg)  BMI 30.44 kg/m2  SpO2 96% Wt Readings from Last 3 Encounters:  04/30/16 161 lb (73.029 kg)  01/27/16 157 lb (71.215 kg)  09/05/15 157 lb (71.215 kg)     Lab Results  Component Value Date   WBC 7.2 01/31/2016   HGB 13.1 01/31/2016   HCT 38.5 01/31/2016   PLT 209.0 01/31/2016   GLUCOSE 135* 03/27/2016   CHOL 207* 01/31/2016   TRIG 248.0* 01/31/2016   HDL 47.10 01/31/2016   LDLDIRECT 112.0 01/31/2016   LDLCALC 125* 07/22/2015   ALT 9 03/27/2016   AST 22 03/27/2016   NA 142 03/27/2016   K 4.3 03/27/2016   CL 104 03/27/2016   CREATININE 1.00 03/27/2016   BUN 13 03/27/2016   CO2 34* 03/27/2016   TSH 2.96 01/31/2016   INR 1.0 07/21/2010   HGBA1C 6.0 01/31/2016   MICROALBUR 0.9 01/31/2016    Lab  Results  Component Value Date   TSH 2.96 01/31/2016   Lab Results  Component Value Date   WBC 7.2 01/31/2016   HGB 13.1 01/31/2016   HCT 38.5 01/31/2016   MCV 91.9 01/31/2016   PLT 209.0 01/31/2016   Lab Results  Component Value Date   NA 142 03/27/2016   K 4.3 03/27/2016   CO2 34* 03/27/2016   GLUCOSE 135* 03/27/2016   BUN 13 03/27/2016   CREATININE 1.00 03/27/2016   BILITOT 0.6 03/27/2016   ALKPHOS 77 03/27/2016   AST 22 03/27/2016   ALT 9 03/27/2016   PROT 6.9 03/27/2016   ALBUMIN 4.3 03/27/2016   CALCIUM 10.3 03/27/2016   ANIONGAP 11 04/20/2015   GFR 56.39* 03/27/2016   Lab Results  Component Value Date   CHOL 207* 01/31/2016   Lab Results  Component Value Date   HDL 47.10 01/31/2016   Lab Results  Component Value Date   LDLCALC 125* 07/22/2015   Lab Results  Component Value Date   TRIG 248.0* 01/31/2016   Lab Results  Component Value Date   CHOLHDL 4 01/31/2016   Lab Results  Component Value Date   HGBA1C 6.0 01/31/2016       Assessment & Plan:   Problem List Items Addressed This Visit    Hypothyroidism - Primary    On Levothyroxine, continue to monitor      Relevant Orders   Hemoglobin A1c   TSH   CBC   Comprehensive metabolic panel   Lipid panel   Hyperlipidemia, mixed    Tolerating statin, encouraged heart healthy diet, avoid trans fats, minimize simple  carbs and saturated fats. Increase exercise as tolerated      Relevant Orders   Hemoglobin A1c   TSH   CBC   Comprehensive metabolic panel   Lipid panel   RESOLVED: Hyperglycemia    minimize simple carbs. Increase exercise as tolerated.      Relevant Orders   Hemoglobin A1c   Gout    Tolerating Uloric, will check uric acid level with  Next visit      Relevant Orders   Hemoglobin A1c   TSH   CBC   Comprehensive metabolic panel   Lipid panel   GERD    Doing well on Ranitidine, may continue the same, is not needing Lansoprazole. Avoid offending foods, start  probiotics. Do not eat large meals in late evening and consider raising head of bed.       Relevant Orders   Hemoglobin A1c   TSH   CBC   Comprehensive metabolic panel   Lipid panel   Essential hypertension    Well controlled, no changes to meds. Encouraged heart healthy diet such as the DASH diet and exercise as tolerated.       Relevant Orders   Comprehensive metabolic panel   Hemoglobin A1c   TSH   CBC   Comprehensive metabolic panel   Lipid panel   Diabetes mellitus without complication (HCC)    [id number] acceptable, minimize simple carbs. Increase exercise as tolerated. Continue current meds      Conjunctivitis of left eye    Moist hot compresses tid and consider polytrim if symptoms persist.      Relevant Medications   trimethoprim-polymyxin b (POLYTRIM) ophthalmic solution   Other Relevant Orders   Hemoglobin A1c   TSH   CBC   Comprehensive metabolic panel   Lipid panel    Other Visit Diagnoses    Chronic fatigue        Relevant Medications    trimethoprim-polymyxin b (POLYTRIM) ophthalmic solution    Other Relevant Orders    VITAMIN D 25 Hydroxy (Vit-D Deficiency, Fractures)    TSH    Hemoglobin A1c    TSH    CBC    Comprehensive metabolic panel    Lipid panel    Tetanus-diphtheria (Td) vaccination        Relevant Orders    Td : Tetanus/diphtheria >7yo Preservative  free (Completed)       I have discontinued Ms. Chlebowski's lansoprazole. I am also having her start on trimethoprim-polymyxin b. Additionally, I am having her maintain her azelastine, Vitamin D, co-enzyme Q-10, aspirin, fluticasone, RESTASIS, guaiFENesin, traMADol, PATADAY, losartan, furosemide, propranolol ER, levothyroxine, fluticasone, sucralfate, gabapentin, ULORIC, PROAIR HFA, LORazepam, and ALPRAZolam.  Meds ordered this encounter  Medications  . trimethoprim-polymyxin b (POLYTRIM) ophthalmic solution    Sig: Place 1 drop into the left eye every 6 (six) hours.    Dispense:  10 mL     Refill:  0  . ALPRAZolam (XANAX) 0.5 MG tablet    Sig: Take 1 tablet (0.5 mg total) by mouth at bedtime as needed for anxiety.    Dispense:  90 tablet    Refill:  1     [name], MD

## 2016-05-13 DIAGNOSIS — G4733 Obstructive sleep apnea (adult) (pediatric): Secondary | ICD-10-CM | POA: Diagnosis not present

## 2016-05-15 ENCOUNTER — Other Ambulatory Visit: Payer: Self-pay | Admitting: Family Medicine

## 2016-05-17 ENCOUNTER — Other Ambulatory Visit: Payer: Self-pay | Admitting: Internal Medicine

## 2016-05-18 DIAGNOSIS — G8929 Other chronic pain: Secondary | ICD-10-CM | POA: Diagnosis not present

## 2016-05-18 DIAGNOSIS — N183 Chronic kidney disease, stage 3 (moderate): Secondary | ICD-10-CM | POA: Diagnosis not present

## 2016-05-18 DIAGNOSIS — Z9889 Other specified postprocedural states: Secondary | ICD-10-CM | POA: Diagnosis not present

## 2016-05-18 DIAGNOSIS — Z96651 Presence of right artificial knee joint: Secondary | ICD-10-CM | POA: Diagnosis not present

## 2016-05-18 DIAGNOSIS — Z86711 Personal history of pulmonary embolism: Secondary | ICD-10-CM | POA: Diagnosis not present

## 2016-05-18 DIAGNOSIS — E669 Obesity, unspecified: Secondary | ICD-10-CM | POA: Diagnosis not present

## 2016-05-18 DIAGNOSIS — K219 Gastro-esophageal reflux disease without esophagitis: Secondary | ICD-10-CM | POA: Diagnosis not present

## 2016-05-18 DIAGNOSIS — G4733 Obstructive sleep apnea (adult) (pediatric): Secondary | ICD-10-CM | POA: Diagnosis not present

## 2016-05-18 DIAGNOSIS — I2510 Atherosclerotic heart disease of native coronary artery without angina pectoris: Secondary | ICD-10-CM | POA: Diagnosis not present

## 2016-05-18 DIAGNOSIS — K449 Diaphragmatic hernia without obstruction or gangrene: Secondary | ICD-10-CM | POA: Diagnosis not present

## 2016-05-18 DIAGNOSIS — M109 Gout, unspecified: Secondary | ICD-10-CM | POA: Diagnosis not present

## 2016-05-18 DIAGNOSIS — Z7982 Long term (current) use of aspirin: Secondary | ICD-10-CM | POA: Diagnosis not present

## 2016-05-18 DIAGNOSIS — Z79891 Long term (current) use of opiate analgesic: Secondary | ICD-10-CM | POA: Diagnosis not present

## 2016-05-18 DIAGNOSIS — R079 Chest pain, unspecified: Secondary | ICD-10-CM | POA: Diagnosis not present

## 2016-05-18 DIAGNOSIS — R42 Dizziness and giddiness: Secondary | ICD-10-CM | POA: Diagnosis not present

## 2016-05-18 DIAGNOSIS — Z87891 Personal history of nicotine dependence: Secondary | ICD-10-CM | POA: Diagnosis not present

## 2016-05-18 DIAGNOSIS — Z79899 Other long term (current) drug therapy: Secondary | ICD-10-CM | POA: Diagnosis not present

## 2016-05-18 DIAGNOSIS — J449 Chronic obstructive pulmonary disease, unspecified: Secondary | ICD-10-CM | POA: Diagnosis not present

## 2016-05-18 DIAGNOSIS — G629 Polyneuropathy, unspecified: Secondary | ICD-10-CM | POA: Diagnosis not present

## 2016-05-18 DIAGNOSIS — Z7951 Long term (current) use of inhaled steroids: Secondary | ICD-10-CM | POA: Diagnosis not present

## 2016-05-18 DIAGNOSIS — I129 Hypertensive chronic kidney disease with stage 1 through stage 4 chronic kidney disease, or unspecified chronic kidney disease: Secondary | ICD-10-CM | POA: Diagnosis not present

## 2016-05-24 DIAGNOSIS — Z87891 Personal history of nicotine dependence: Secondary | ICD-10-CM | POA: Diagnosis not present

## 2016-05-24 DIAGNOSIS — J449 Chronic obstructive pulmonary disease, unspecified: Secondary | ICD-10-CM | POA: Diagnosis not present

## 2016-06-05 DIAGNOSIS — N958 Other specified menopausal and perimenopausal disorders: Secondary | ICD-10-CM | POA: Diagnosis not present

## 2016-06-05 DIAGNOSIS — Z01419 Encounter for gynecological examination (general) (routine) without abnormal findings: Secondary | ICD-10-CM | POA: Diagnosis not present

## 2016-06-05 DIAGNOSIS — N3281 Overactive bladder: Secondary | ICD-10-CM | POA: Diagnosis not present

## 2016-06-05 DIAGNOSIS — M8588 Other specified disorders of bone density and structure, other site: Secondary | ICD-10-CM | POA: Diagnosis not present

## 2016-06-06 ENCOUNTER — Other Ambulatory Visit: Payer: Self-pay | Admitting: Obstetrics and Gynecology

## 2016-06-06 DIAGNOSIS — Z1231 Encounter for screening mammogram for malignant neoplasm of breast: Secondary | ICD-10-CM

## 2016-06-13 ENCOUNTER — Other Ambulatory Visit: Payer: Self-pay | Admitting: Family Medicine

## 2016-06-13 ENCOUNTER — Ambulatory Visit: Payer: Medicare Other

## 2016-06-13 NOTE — Telephone Encounter (Signed)
Rx request to pharmacy/[facility]  

## 2016-06-14 ENCOUNTER — Other Ambulatory Visit: Payer: Self-pay | Admitting: Family Medicine

## 2016-06-14 DIAGNOSIS — I10 Essential (primary) hypertension: Secondary | ICD-10-CM

## 2016-06-14 DIAGNOSIS — D6489 Other specified anemias: Secondary | ICD-10-CM

## 2016-06-14 DIAGNOSIS — H8110 Benign paroxysmal vertigo, unspecified ear: Secondary | ICD-10-CM

## 2016-06-14 DIAGNOSIS — K2100 Gastro-esophageal reflux disease with esophagitis, without bleeding: Secondary | ICD-10-CM

## 2016-06-14 DIAGNOSIS — K5900 Constipation, unspecified: Secondary | ICD-10-CM

## 2016-06-14 DIAGNOSIS — R0600 Dyspnea, unspecified: Secondary | ICD-10-CM

## 2016-06-14 DIAGNOSIS — R0602 Shortness of breath: Secondary | ICD-10-CM

## 2016-06-14 DIAGNOSIS — E119 Type 2 diabetes mellitus without complications: Secondary | ICD-10-CM

## 2016-06-14 DIAGNOSIS — Z23 Encounter for immunization: Secondary | ICD-10-CM

## 2016-06-15 ENCOUNTER — Other Ambulatory Visit: Payer: Self-pay

## 2016-06-15 MED ORDER — SUCRALFATE 1 G PO TABS: 1.0000 g | ORAL_TABLET | Freq: Four times a day (QID) | ORAL | 1 refills | Status: DC

## 2016-06-18 ENCOUNTER — Other Ambulatory Visit: Payer: Self-pay | Admitting: Family Medicine

## 2016-06-18 MED ORDER — LORAZEPAM 0.5 MG PO TABS: 0.5000 mg | ORAL_TABLET | Freq: Two times a day (BID) | ORAL | 1 refills | Status: DC | PRN

## 2016-06-18 NOTE — Telephone Encounter (Signed)
Requesting:  Lorazepam Contract  None UDS    None Last OV   04/30/2016 Last Refill   #60 with 1 refill on 04/03/2016  Please Advise  Refill request to send to [facility]

## 2016-06-20 ENCOUNTER — Other Ambulatory Visit: Payer: Self-pay | Admitting: Family Medicine

## 2016-06-25 ENCOUNTER — Ambulatory Visit: Payer: Medicare Other

## 2016-06-25 DIAGNOSIS — K219 Gastro-esophageal reflux disease without esophagitis: Secondary | ICD-10-CM | POA: Diagnosis not present

## 2016-06-25 DIAGNOSIS — J383 Other diseases of vocal cords: Secondary | ICD-10-CM | POA: Diagnosis not present

## 2016-06-25 DIAGNOSIS — Z7951 Long term (current) use of inhaled steroids: Secondary | ICD-10-CM | POA: Diagnosis not present

## 2016-06-25 DIAGNOSIS — J449 Chronic obstructive pulmonary disease, unspecified: Secondary | ICD-10-CM | POA: Diagnosis not present

## 2016-06-25 DIAGNOSIS — Z86711 Personal history of pulmonary embolism: Secondary | ICD-10-CM | POA: Diagnosis not present

## 2016-06-25 DIAGNOSIS — Z7982 Long term (current) use of aspirin: Secondary | ICD-10-CM | POA: Diagnosis not present

## 2016-06-25 DIAGNOSIS — Z87891 Personal history of nicotine dependence: Secondary | ICD-10-CM | POA: Diagnosis not present

## 2016-06-25 DIAGNOSIS — J384 Edema of larynx: Secondary | ICD-10-CM | POA: Diagnosis not present

## 2016-06-25 DIAGNOSIS — I509 Heart failure, unspecified: Secondary | ICD-10-CM | POA: Diagnosis not present

## 2016-06-25 DIAGNOSIS — Z86718 Personal history of other venous thrombosis and embolism: Secondary | ICD-10-CM | POA: Diagnosis not present

## 2016-06-25 DIAGNOSIS — Z79899 Other long term (current) drug therapy: Secondary | ICD-10-CM | POA: Diagnosis not present

## 2016-06-25 DIAGNOSIS — I130 Hypertensive heart and chronic kidney disease with heart failure and stage 1 through stage 4 chronic kidney disease, or unspecified chronic kidney disease: Secondary | ICD-10-CM | POA: Diagnosis not present

## 2016-06-25 DIAGNOSIS — R062 Wheezing: Secondary | ICD-10-CM | POA: Diagnosis not present

## 2016-06-25 DIAGNOSIS — R0602 Shortness of breath: Secondary | ICD-10-CM | POA: Diagnosis not present

## 2016-06-25 DIAGNOSIS — N183 Chronic kidney disease, stage 3 (moderate): Secondary | ICD-10-CM | POA: Diagnosis not present

## 2016-06-25 DIAGNOSIS — R490 Dysphonia: Secondary | ICD-10-CM | POA: Diagnosis not present

## 2016-06-25 DIAGNOSIS — Z888 Allergy status to other drugs, medicaments and biological substances status: Secondary | ICD-10-CM | POA: Diagnosis not present

## 2016-06-25 DIAGNOSIS — R05 Cough: Secondary | ICD-10-CM | POA: Diagnosis not present

## 2016-06-25 DIAGNOSIS — E039 Hypothyroidism, unspecified: Secondary | ICD-10-CM | POA: Diagnosis not present

## 2016-06-25 DIAGNOSIS — I2510 Atherosclerotic heart disease of native coronary artery without angina pectoris: Secondary | ICD-10-CM | POA: Diagnosis not present

## 2016-07-02 ENCOUNTER — Ambulatory Visit
Admission: RE | Admit: 2016-07-02 | Discharge: 2016-07-02 | Disposition: A | Discharge status: Home / Self Care | Payer: Medicare Other | Source: Ambulatory Visit | Attending: Obstetrics and Gynecology | Admitting: Obstetrics and Gynecology

## 2016-07-02 DIAGNOSIS — Z1231 Encounter for screening mammogram for malignant neoplasm of breast: Secondary | ICD-10-CM | POA: Diagnosis not present
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
## 2016-07-06 ENCOUNTER — Ambulatory Visit (INDEPENDENT_AMBULATORY_CARE_PROVIDER_SITE_OTHER): Payer: Medicare Other | Admitting: Cardiology

## 2016-07-06 ENCOUNTER — Encounter: Payer: Self-pay | Admitting: Cardiology

## 2016-07-06 VITALS — BP 106/62 | HR 68 | Ht 61.0 in | Wt 161.0 lb

## 2016-07-06 DIAGNOSIS — I10 Essential (primary) hypertension: Secondary | ICD-10-CM | POA: Diagnosis not present

## 2016-07-06 DIAGNOSIS — I779 Disorder of arteries and arterioles, unspecified: Secondary | ICD-10-CM

## 2016-07-06 DIAGNOSIS — I739 Peripheral vascular disease, unspecified: Secondary | ICD-10-CM

## 2016-07-06 DIAGNOSIS — E782 Mixed hyperlipidemia: Secondary | ICD-10-CM

## 2016-07-06 DIAGNOSIS — I110 Hypertensive heart disease with heart failure: Secondary | ICD-10-CM | POA: Diagnosis not present

## 2016-07-06 NOTE — Patient Instructions (Signed)
Continue your current therapy  I encourage you to walk more  We will schedule follow up carotid dopplers.  I will see you in 6 months.

## 2016-07-06 NOTE — Addendum Note (Signed)
Addended by: Kathyrn Lass on: 07/06/2016 08:55 AM   Modules accepted: Orders

## 2016-07-10 ENCOUNTER — Ambulatory Visit (HOSPITAL_COMMUNITY)
Admission: RE | Admit: 2016-07-10 | Discharge: 2016-07-10 | Disposition: A | Discharge status: Home / Self Care | Payer: Medicare Other | Source: Ambulatory Visit | Attending: Internal Medicine | Admitting: Internal Medicine

## 2016-07-10 DIAGNOSIS — I6523 Occlusion and stenosis of bilateral carotid arteries: Secondary | ICD-10-CM | POA: Insufficient documentation

## 2016-07-10 DIAGNOSIS — E119 Type 2 diabetes mellitus without complications: Secondary | ICD-10-CM | POA: Diagnosis not present

## 2016-07-10 DIAGNOSIS — I110 Hypertensive heart disease with heart failure: Secondary | ICD-10-CM | POA: Insufficient documentation

## 2016-07-10 DIAGNOSIS — Z720 Tobacco use: Secondary | ICD-10-CM | POA: Insufficient documentation

## 2016-07-10 DIAGNOSIS — I779 Disorder of arteries and arterioles, unspecified: Secondary | ICD-10-CM | POA: Diagnosis not present

## 2016-07-10 DIAGNOSIS — I6502 Occlusion and stenosis of left vertebral artery: Secondary | ICD-10-CM | POA: Insufficient documentation

## 2016-07-10 DIAGNOSIS — I10 Essential (primary) hypertension: Secondary | ICD-10-CM

## 2016-07-10 DIAGNOSIS — J449 Chronic obstructive pulmonary disease, unspecified: Secondary | ICD-10-CM | POA: Insufficient documentation

## 2016-07-10 DIAGNOSIS — I739 Peripheral vascular disease, unspecified: Secondary | ICD-10-CM

## 2016-07-10 DIAGNOSIS — E782 Mixed hyperlipidemia: Secondary | ICD-10-CM | POA: Diagnosis not present

## 2016-07-13 ENCOUNTER — Other Ambulatory Visit: Payer: Self-pay | Admitting: Family Medicine

## 2016-07-13 ENCOUNTER — Other Ambulatory Visit: Payer: Self-pay

## 2016-07-13 DIAGNOSIS — I779 Disorder of arteries and arterioles, unspecified: Secondary | ICD-10-CM

## 2016-07-13 DIAGNOSIS — I739 Peripheral vascular disease, unspecified: Principal | ICD-10-CM

## 2016-08-09 DIAGNOSIS — Z961 Presence of intraocular lens: Secondary | ICD-10-CM | POA: Diagnosis not present

## 2016-08-09 DIAGNOSIS — H04123 Dry eye syndrome of bilateral lacrimal glands: Secondary | ICD-10-CM | POA: Diagnosis not present

## 2016-08-14 DIAGNOSIS — I10 Essential (primary) hypertension: Secondary | ICD-10-CM | POA: Diagnosis not present

## 2016-08-14 DIAGNOSIS — D126 Benign neoplasm of colon, unspecified: Secondary | ICD-10-CM | POA: Diagnosis not present

## 2016-08-14 DIAGNOSIS — K219 Gastro-esophageal reflux disease without esophagitis: Secondary | ICD-10-CM | POA: Diagnosis not present

## 2016-08-14 DIAGNOSIS — Z79891 Long term (current) use of opiate analgesic: Secondary | ICD-10-CM | POA: Diagnosis not present

## 2016-08-14 DIAGNOSIS — Z7951 Long term (current) use of inhaled steroids: Secondary | ICD-10-CM | POA: Diagnosis not present

## 2016-08-14 DIAGNOSIS — Z7982 Long term (current) use of aspirin: Secondary | ICD-10-CM | POA: Diagnosis not present

## 2016-08-14 DIAGNOSIS — Z86010 Personal history of colonic polyps: Secondary | ICD-10-CM | POA: Diagnosis not present

## 2016-08-14 DIAGNOSIS — Z800 Family history of malignant neoplasm of digestive organs: Secondary | ICD-10-CM | POA: Diagnosis not present

## 2016-08-14 DIAGNOSIS — Z79899 Other long term (current) drug therapy: Secondary | ICD-10-CM | POA: Diagnosis not present

## 2016-08-23 ENCOUNTER — Other Ambulatory Visit (INDEPENDENT_AMBULATORY_CARE_PROVIDER_SITE_OTHER): Payer: Medicare Other

## 2016-08-23 DIAGNOSIS — M109 Gout, unspecified: Secondary | ICD-10-CM | POA: Diagnosis not present

## 2016-08-23 DIAGNOSIS — E039 Hypothyroidism, unspecified: Secondary | ICD-10-CM

## 2016-08-23 DIAGNOSIS — H109 Unspecified conjunctivitis: Secondary | ICD-10-CM

## 2016-08-23 DIAGNOSIS — K219 Gastro-esophageal reflux disease without esophagitis: Secondary | ICD-10-CM

## 2016-08-23 DIAGNOSIS — I10 Essential (primary) hypertension: Secondary | ICD-10-CM | POA: Diagnosis not present

## 2016-08-23 DIAGNOSIS — E782 Mixed hyperlipidemia: Secondary | ICD-10-CM | POA: Diagnosis not present

## 2016-08-23 DIAGNOSIS — R5382 Chronic fatigue, unspecified: Secondary | ICD-10-CM

## 2016-08-23 DIAGNOSIS — R739 Hyperglycemia, unspecified: Secondary | ICD-10-CM | POA: Diagnosis not present

## 2016-08-23 LAB — COMPREHENSIVE METABOLIC PANEL
ALBUMIN: 3.9 g/dL (ref 3.5–5.2)
ALK PHOS: 74 U/L (ref 39–117)
ALT: 6 U/L (ref 0–35)
AST: 20 U/L (ref 0–37)
BILIRUBIN TOTAL: 0.5 mg/dL (ref 0.2–1.2)
BUN: 13 mg/dL (ref 6–23)
CALCIUM: 10.1 mg/dL (ref 8.4–10.5)
CO2: 31 meq/L (ref 19–32)
CREATININE: 1.2 mg/dL (ref 0.40–1.20)
Chloride: 104 mEq/L (ref 96–112)
GFR: 45.64 mL/min — AB (ref >60.00)
Glucose, Bld: 112 mg/dL — ABNORMAL HIGH (ref 70–99)
Potassium: 4.2 mEq/L (ref 3.5–5.1)
Sodium: 140 mEq/L (ref 135–145)
TOTAL PROTEIN: 7.1 g/dL (ref 6.0–8.3)

## 2016-08-23 LAB — LIPID PANEL
Cholesterol: 193 mg/dL (ref 0–200)
HDL: 42.7 mg/dL (ref >39.00)
NONHDL: 150.31
TRIGLYCERIDES: 322 mg/dL — AB (ref 0.0–149.0)
Total CHOL/HDL Ratio: 5
VLDL: 64.4 mg/dL — ABNORMAL HIGH (ref 0.0–40.0)

## 2016-08-23 LAB — CBC
HCT: 36.6 % (ref 36.0–46.0)
HEMOGLOBIN: 12.4 g/dL (ref 12.0–15.0)
MCHC: 34 g/dL (ref 30.0–36.0)
MCV: 94.1 fl (ref 78.0–100.0)
PLATELETS: 217 10*3/uL (ref 150.0–400.0)
RBC: 3.89 Mil/uL (ref 3.87–5.11)
RDW: 13.7 % (ref 11.5–15.5)
WBC: 9.2 10*3/uL (ref 4.0–10.5)

## 2016-08-23 LAB — HEMOGLOBIN A1C: HEMOGLOBIN A1C: 5.7 % (ref 4.6–6.5)

## 2016-08-23 LAB — TSH: TSH: 2.24 u[IU]/mL (ref 0.35–4.50)

## 2016-08-23 LAB — LDL CHOLESTEROL, DIRECT: Direct LDL: 96 mg/dL

## 2016-08-30 ENCOUNTER — Ambulatory Visit (INDEPENDENT_AMBULATORY_CARE_PROVIDER_SITE_OTHER): Payer: Medicare Other | Admitting: Family Medicine

## 2016-08-30 ENCOUNTER — Encounter: Payer: Self-pay | Admitting: Family Medicine

## 2016-08-30 VITALS — BP 110/54 | HR 69 | Temp 98.0°F | Resp 16 | Ht 61.0 in | Wt 160.0 lb

## 2016-08-30 DIAGNOSIS — E782 Mixed hyperlipidemia: Secondary | ICD-10-CM

## 2016-08-30 DIAGNOSIS — Z23 Encounter for immunization: Secondary | ICD-10-CM | POA: Diagnosis not present

## 2016-08-30 DIAGNOSIS — E039 Hypothyroidism, unspecified: Secondary | ICD-10-CM | POA: Diagnosis not present

## 2016-08-30 DIAGNOSIS — I110 Hypertensive heart disease with heart failure: Secondary | ICD-10-CM

## 2016-08-30 DIAGNOSIS — I5022 Chronic systolic (congestive) heart failure: Secondary | ICD-10-CM

## 2016-08-30 DIAGNOSIS — K219 Gastro-esophageal reflux disease without esophagitis: Secondary | ICD-10-CM

## 2016-08-30 DIAGNOSIS — I10 Essential (primary) hypertension: Secondary | ICD-10-CM

## 2016-08-30 DIAGNOSIS — E119 Type 2 diabetes mellitus without complications: Secondary | ICD-10-CM

## 2016-08-30 DIAGNOSIS — M109 Gout, unspecified: Secondary | ICD-10-CM | POA: Diagnosis not present

## 2016-08-30 DIAGNOSIS — D649 Anemia, unspecified: Secondary | ICD-10-CM

## 2016-08-30 MED ORDER — LEVOFLOXACIN 250 MG PO TABS: 250.0000 mg | ORAL_TABLET | Freq: Every day | ORAL | 0 refills | Status: DC

## 2016-08-30 MED ORDER — LOSARTAN POTASSIUM 50 MG PO TABS: 50.0000 mg | ORAL_TABLET | Freq: Every day | ORAL | 1 refills | Status: DC

## 2016-08-30 MED ORDER — FENOFIBRATE MICRONIZED 130 MG PO CAPS: 130.0000 mg | ORAL_CAPSULE | Freq: Every day | ORAL | 1 refills | Status: DC

## 2016-08-30 NOTE — Progress Notes (Signed)
Pre visit review using our clinic review tool, if applicable. No additional management support is needed unless otherwise documented below in the visit note. 
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
## 2016-08-30 NOTE — Patient Instructions (Signed)

## 2016-08-31 DIAGNOSIS — G4733 Obstructive sleep apnea (adult) (pediatric): Secondary | ICD-10-CM | POA: Diagnosis not present

## 2016-08-31 DIAGNOSIS — Z9889 Other specified postprocedural states: Secondary | ICD-10-CM | POA: Diagnosis not present

## 2016-08-31 DIAGNOSIS — Z87891 Personal history of nicotine dependence: Secondary | ICD-10-CM | POA: Diagnosis not present

## 2016-08-31 DIAGNOSIS — Z9049 Acquired absence of other specified parts of digestive tract: Secondary | ICD-10-CM | POA: Diagnosis not present

## 2016-08-31 DIAGNOSIS — Z86711 Personal history of pulmonary embolism: Secondary | ICD-10-CM | POA: Diagnosis not present

## 2016-08-31 DIAGNOSIS — Z9989 Dependence on other enabling machines and devices: Secondary | ICD-10-CM | POA: Diagnosis not present

## 2016-08-31 DIAGNOSIS — Z8619 Personal history of other infectious and parasitic diseases: Secondary | ICD-10-CM | POA: Diagnosis not present

## 2016-08-31 DIAGNOSIS — K219 Gastro-esophageal reflux disease without esophagitis: Secondary | ICD-10-CM | POA: Diagnosis not present

## 2016-08-31 DIAGNOSIS — G8929 Other chronic pain: Secondary | ICD-10-CM | POA: Diagnosis not present

## 2016-08-31 DIAGNOSIS — J449 Chronic obstructive pulmonary disease, unspecified: Secondary | ICD-10-CM | POA: Diagnosis not present

## 2016-08-31 NOTE — Assessment & Plan Note (Signed)
Avoid offending foods, start probiotics. Do not eat large meals in late evening and consider raising head of bed.  

## 2016-08-31 NOTE — Assessment & Plan Note (Signed)
On Levothyroxine, continue to monitor 

## 2016-08-31 NOTE — Assessment & Plan Note (Signed)
Well controlled, no changes to meds. Encouraged heart healthy diet such as the DASH diet and exercise as tolerated.  °

## 2016-08-31 NOTE — Assessment & Plan Note (Signed)
hgba1c acceptable, minimize simple carbs. Increase exercise as tolerated. Continue current meds 

## 2016-08-31 NOTE — Assessment & Plan Note (Signed)
Encouraged heart healthy diet, increase exercise, avoid trans fats, consider a krill oil cap daily 

## 2016-09-06 DIAGNOSIS — M6289 Other specified disorders of muscle: Secondary | ICD-10-CM | POA: Diagnosis not present

## 2016-09-06 DIAGNOSIS — Z888 Allergy status to other drugs, medicaments and biological substances status: Secondary | ICD-10-CM | POA: Diagnosis not present

## 2016-09-06 DIAGNOSIS — E039 Hypothyroidism, unspecified: Secondary | ICD-10-CM | POA: Diagnosis not present

## 2016-09-06 DIAGNOSIS — K5790 Diverticulosis of intestine, part unspecified, without perforation or abscess without bleeding: Secondary | ICD-10-CM | POA: Diagnosis not present

## 2016-09-06 DIAGNOSIS — K6289 Other specified diseases of anus and rectum: Secondary | ICD-10-CM | POA: Diagnosis not present

## 2016-09-06 DIAGNOSIS — G4739 Other sleep apnea: Secondary | ICD-10-CM | POA: Diagnosis not present

## 2016-09-06 DIAGNOSIS — I2510 Atherosclerotic heart disease of native coronary artery without angina pectoris: Secondary | ICD-10-CM | POA: Diagnosis not present

## 2016-09-06 DIAGNOSIS — J449 Chronic obstructive pulmonary disease, unspecified: Secondary | ICD-10-CM | POA: Diagnosis not present

## 2016-09-06 DIAGNOSIS — Z86010 Personal history of colonic polyps: Secondary | ICD-10-CM | POA: Diagnosis not present

## 2016-09-06 DIAGNOSIS — K5730 Diverticulosis of large intestine without perforation or abscess without bleeding: Secondary | ICD-10-CM | POA: Diagnosis not present

## 2016-09-06 DIAGNOSIS — Z800 Family history of malignant neoplasm of digestive organs: Secondary | ICD-10-CM | POA: Diagnosis not present

## 2016-09-06 DIAGNOSIS — Z79899 Other long term (current) drug therapy: Secondary | ICD-10-CM | POA: Diagnosis not present

## 2016-09-06 DIAGNOSIS — I130 Hypertensive heart and chronic kidney disease with heart failure and stage 1 through stage 4 chronic kidney disease, or unspecified chronic kidney disease: Secondary | ICD-10-CM | POA: Diagnosis not present

## 2016-09-06 DIAGNOSIS — I509 Heart failure, unspecified: Secondary | ICD-10-CM | POA: Diagnosis not present

## 2016-09-06 DIAGNOSIS — G2581 Restless legs syndrome: Secondary | ICD-10-CM | POA: Diagnosis not present

## 2016-09-06 DIAGNOSIS — N183 Chronic kidney disease, stage 3 (moderate): Secondary | ICD-10-CM | POA: Diagnosis not present

## 2016-09-06 DIAGNOSIS — F419 Anxiety disorder, unspecified: Secondary | ICD-10-CM | POA: Diagnosis not present

## 2016-09-06 DIAGNOSIS — K562 Volvulus: Secondary | ICD-10-CM | POA: Diagnosis not present

## 2016-09-06 DIAGNOSIS — M109 Gout, unspecified: Secondary | ICD-10-CM | POA: Diagnosis not present

## 2016-09-06 DIAGNOSIS — Z7982 Long term (current) use of aspirin: Secondary | ICD-10-CM | POA: Diagnosis not present

## 2016-09-06 DIAGNOSIS — Z9889 Other specified postprocedural states: Secondary | ICD-10-CM | POA: Diagnosis not present

## 2016-09-06 DIAGNOSIS — Z9102 Food additives allergy status: Secondary | ICD-10-CM | POA: Diagnosis not present

## 2016-09-06 DIAGNOSIS — K219 Gastro-esophageal reflux disease without esophagitis: Secondary | ICD-10-CM | POA: Diagnosis not present

## 2016-09-06 DIAGNOSIS — I6529 Occlusion and stenosis of unspecified carotid artery: Secondary | ICD-10-CM | POA: Diagnosis not present

## 2016-09-06 DIAGNOSIS — Z1211 Encounter for screening for malignant neoplasm of colon: Secondary | ICD-10-CM | POA: Diagnosis not present

## 2016-09-06 DIAGNOSIS — K648 Other hemorrhoids: Secondary | ICD-10-CM | POA: Diagnosis not present

## 2016-09-11 ENCOUNTER — Encounter: Payer: Self-pay | Admitting: Family Medicine

## 2016-09-17 ENCOUNTER — Other Ambulatory Visit: Payer: Self-pay | Admitting: Family Medicine

## 2016-09-17 DIAGNOSIS — H8110 Benign paroxysmal vertigo, unspecified ear: Secondary | ICD-10-CM

## 2016-09-17 DIAGNOSIS — E782 Mixed hyperlipidemia: Secondary | ICD-10-CM

## 2016-09-17 DIAGNOSIS — K5900 Constipation, unspecified: Secondary | ICD-10-CM

## 2016-09-17 DIAGNOSIS — R0602 Shortness of breath: Secondary | ICD-10-CM

## 2016-09-17 DIAGNOSIS — Z23 Encounter for immunization: Secondary | ICD-10-CM

## 2016-09-17 DIAGNOSIS — K2100 Gastro-esophageal reflux disease with esophagitis, without bleeding: Secondary | ICD-10-CM

## 2016-09-17 DIAGNOSIS — R0600 Dyspnea, unspecified: Secondary | ICD-10-CM

## 2016-09-17 DIAGNOSIS — I10 Essential (primary) hypertension: Secondary | ICD-10-CM

## 2016-09-17 DIAGNOSIS — E119 Type 2 diabetes mellitus without complications: Secondary | ICD-10-CM

## 2016-09-17 MED ORDER — ALPRAZOLAM 0.5 MG PO TABS: 0.5000 mg | ORAL_TABLET | Freq: Every evening | ORAL | 1 refills | Status: DC | PRN

## 2016-09-17 MED ORDER — LOSARTAN POTASSIUM 100 MG PO TABS: 100.0000 mg | ORAL_TABLET | Freq: Every day | ORAL | 1 refills | Status: DC

## 2016-09-17 MED ORDER — GABAPENTIN 300 MG PO CAPS: 300.0000 mg | ORAL_CAPSULE | Freq: Every day | ORAL | 1 refills | Status: DC

## 2016-09-17 MED ORDER — RANITIDINE HCL 150 MG PO CAPS: 150.0000 mg | ORAL_CAPSULE | Freq: Every day | ORAL | 1 refills | Status: DC

## 2016-09-17 MED ORDER — SUCRALFATE 1 G PO TABS: 1.0000 g | ORAL_TABLET | Freq: Four times a day (QID) | ORAL | 0 refills | Status: DC

## 2016-09-17 MED ORDER — PROPRANOLOL HCL ER 60 MG PO CP24: 60.0000 mg | ORAL_CAPSULE | Freq: Every day | ORAL | 1 refills | Status: DC

## 2016-09-17 MED ORDER — LEVOTHYROXINE SODIUM 50 MCG PO TABS: 50.0000 ug | ORAL_TABLET | Freq: Every day | ORAL | 1 refills | Status: DC

## 2016-09-17 MED ORDER — FENOFIBRATE MICRONIZED 130 MG PO CAPS: 130.0000 mg | ORAL_CAPSULE | Freq: Every day | ORAL | 1 refills | Status: DC

## 2016-09-17 MED ORDER — FLUTICASONE PROPIONATE HFA 110 MCG/ACT IN AERO: 2.0000 | INHALATION_SPRAY | Freq: Two times a day (BID) | RESPIRATORY_TRACT | 3 refills | Status: DC

## 2016-09-17 NOTE — Telephone Encounter (Signed)
Faxed hardcopy for Fenofibrate to [facility].

## 2016-09-25 DIAGNOSIS — R05 Cough: Secondary | ICD-10-CM | POA: Diagnosis not present

## 2016-09-25 DIAGNOSIS — J324 Chronic pansinusitis: Secondary | ICD-10-CM | POA: Diagnosis not present

## 2016-10-02 ENCOUNTER — Telehealth: Payer: Self-pay | Admitting: Family Medicine

## 2016-10-02 MED ORDER — FUROSEMIDE 40 MG PO TABS: 40.0000 mg | ORAL_TABLET | Freq: Every day | ORAL | 1 refills | Status: DC

## 2016-10-02 NOTE — Telephone Encounter (Signed)
Sent in furosemide as requested. Called the patient left a message sent in.

## 2016-10-02 NOTE — Telephone Encounter (Signed)
Relation to [id number] Call back [phone] Pharmacy: [place], [place] [phone] (Phone) [phone] (Fax)     Reason for call:  Patient requesting a refill furosemide (LASIX) 40 MG tablet, patient states she has 1 pill left.

## 2016-10-31 DIAGNOSIS — J324 Chronic pansinusitis: Secondary | ICD-10-CM | POA: Diagnosis not present

## 2016-10-31 DIAGNOSIS — J329 Chronic sinusitis, unspecified: Secondary | ICD-10-CM | POA: Diagnosis not present

## 2016-11-15 ENCOUNTER — Other Ambulatory Visit: Payer: Self-pay | Admitting: Family Medicine

## 2016-11-16 ENCOUNTER — Other Ambulatory Visit: Payer: Self-pay | Admitting: Family Medicine

## 2016-11-16 MED ORDER — ALPRAZOLAM 0.5 MG PO TABS: 0.5000 mg | ORAL_TABLET | Freq: Every evening | ORAL | 1 refills | Status: DC | PRN

## 2016-11-16 MED ORDER — SUCRALFATE 1 G PO TABS: 1.0000 g | ORAL_TABLET | Freq: Four times a day (QID) | ORAL | 0 refills | Status: DC

## 2016-11-16 MED ORDER — RANITIDINE HCL 150 MG PO CAPS: 150.0000 mg | ORAL_CAPSULE | Freq: Every day | ORAL | 1 refills | Status: DC

## 2016-11-16 NOTE — Telephone Encounter (Signed)
Last refill for Alprazolam went to her [facility] this request is for [facility] with 1 refill on 09/17/2016 Last office visit 08/30/2016 No UDS or contract.

## 2016-11-16 NOTE — Telephone Encounter (Signed)
Faxed hardcopy for Alprazolam to [facility] 

## 2016-11-19 ENCOUNTER — Other Ambulatory Visit: Payer: Self-pay

## 2016-11-19 MED ORDER — SUCRALFATE 1 G PO TABS: 1.0000 g | ORAL_TABLET | Freq: Four times a day (QID) | ORAL | 0 refills | Status: DC

## 2016-11-22 ENCOUNTER — Other Ambulatory Visit: Payer: Self-pay

## 2016-11-30 ENCOUNTER — Other Ambulatory Visit: Payer: Medicare Other

## 2016-12-04 ENCOUNTER — Ambulatory Visit (INDEPENDENT_AMBULATORY_CARE_PROVIDER_SITE_OTHER): Payer: Medicare Other | Admitting: Family Medicine

## 2016-12-04 ENCOUNTER — Encounter: Payer: Self-pay | Admitting: Family Medicine

## 2016-12-04 VITALS — BP 118/60 | HR 71 | Temp 98.0°F | Ht 61.0 in | Wt 157.0 lb

## 2016-12-04 DIAGNOSIS — E782 Mixed hyperlipidemia: Secondary | ICD-10-CM | POA: Diagnosis not present

## 2016-12-04 DIAGNOSIS — I10 Essential (primary) hypertension: Secondary | ICD-10-CM

## 2016-12-04 DIAGNOSIS — E039 Hypothyroidism, unspecified: Secondary | ICD-10-CM

## 2016-12-04 DIAGNOSIS — H8110 Benign paroxysmal vertigo, unspecified ear: Secondary | ICD-10-CM

## 2016-12-04 DIAGNOSIS — M109 Gout, unspecified: Secondary | ICD-10-CM | POA: Diagnosis not present

## 2016-12-04 DIAGNOSIS — E119 Type 2 diabetes mellitus without complications: Secondary | ICD-10-CM

## 2016-12-04 DIAGNOSIS — R5383 Other fatigue: Secondary | ICD-10-CM

## 2016-12-04 DIAGNOSIS — R5381 Other malaise: Secondary | ICD-10-CM | POA: Insufficient documentation

## 2016-12-04 MED ORDER — ALPRAZOLAM 0.5 MG PO TABS: 0.5000 mg | ORAL_TABLET | Freq: Every evening | ORAL | 1 refills | Status: DC | PRN

## 2016-12-04 MED ORDER — FUROSEMIDE 40 MG PO TABS: 40.0000 mg | ORAL_TABLET | Freq: Every day | ORAL | 1 refills | Status: DC

## 2016-12-04 MED ORDER — BENZONATATE 100 MG PO CAPS: 100.0000 mg | ORAL_CAPSULE | Freq: Two times a day (BID) | ORAL | 1 refills | Status: DC | PRN

## 2016-12-04 MED ORDER — GABAPENTIN 300 MG PO CAPS: 300.0000 mg | ORAL_CAPSULE | Freq: Every day | ORAL | 1 refills | Status: DC
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
## 2016-12-04 NOTE — Assessment & Plan Note (Signed)
Improved on a second round of Levaquin but symptoms still present, will offer a refill. Also given Tessalon Perles to sue prn.

## 2016-12-04 NOTE — Patient Instructions (Signed)
Carbohydrate Counting for Diabetes Mellitus, Adult Carbohydrate counting is a method for keeping track of how many carbohydrates you eat. Eating carbohydrates naturally increases the amount of sugar (glucose) in the blood. Counting how many carbohydrates you eat helps keep your blood glucose within normal limits, which helps you manage your diabetes (diabetes mellitus). It is important to know how many carbohydrates you can safely have in each meal. This is different for every person. A diet and nutrition specialist (registered dietitian) can help you make a meal plan and calculate how many carbohydrates you should have at each meal and snack. Carbohydrates are found in the following foods:  Grains, such as breads and cereals.  Dried beans and soy products.  Starchy vegetables, such as potatoes, peas, and corn.  Fruit and fruit juices.  Milk and yogurt.  Sweets and snack foods, such as cake, cookies, candy, chips, and soft drinks. How do I count carbohydrates? There are two ways to count carbohydrates in food. You can use either of the methods or a combination of both. Reading "Nutrition Facts" on packaged food  The "Nutrition Facts" list is included on the labels of almost all packaged foods and beverages in the [place] It includes:  The serving size.  Information about nutrients in each serving, including the grams (g) of carbohydrate per serving. To use the "Nutrition Facts":  Decide how many servings you will have.  Multiply the number of servings by the number of carbohydrates per serving.  The resulting number is the total amount of carbohydrates that you will be having. Learning standard serving sizes of other foods  When you eat foods containing carbohydrates that are not packaged or do not include "Nutrition Facts" on the label, you need to measure the servings in order to count the amount of carbohydrates:  Measure the foods that you will eat with a food scale or measuring  cup, if needed.  Decide how many standard-size servings you will eat.  Multiply the number of servings by 15. Most carbohydrate-rich foods have about 15 g of carbohydrates per serving.  For example, if you eat 8 oz (170 g) of strawberries, you will have eaten 2 servings and 30 g of carbohydrates (2 servings x 15 g = 30 g).  For foods that have more than one food mixed, such as soups and casseroles, you must count the carbohydrates in each food that is included. The following list contains standard serving sizes of common carbohydrate-rich foods. Each of these servings has about 15 g of carbohydrates:   hamburger bun or  English muffin.   oz (15 mL) syrup.   oz (14 g) jelly.  1 slice of bread.  1 six-inch tortilla.  3 oz (85 g) cooked rice or pasta.  4 oz (113 g) cooked dried beans.  4 oz (113 g) starchy vegetable, such as peas, corn, or potatoes.  4 oz (113 g) hot cereal.  4 oz (113 g) mashed potatoes or  of a large baked potato.  4 oz (113 g) canned or frozen fruit.  4 oz (120 mL) fruit juice.  4-6 crackers.  6 chicken nuggets.  6 oz (170 g) unsweetened dry cereal.  6 oz (170 g) plain fat-free yogurt or yogurt sweetened with artificial sweeteners.  8 oz (240 mL) milk.  8 oz (170 g) fresh fruit or one small piece of fruit.  24 oz (680 g) popped popcorn. Example of carbohydrate counting Sample meal  3 oz (85 g) chicken breast.  6 oz (  170 g) brown rice.  4 oz (113 g) corn.  8 oz (240 mL) milk.  8 oz (170 g) strawberries with sugar-free whipped topping. Carbohydrate calculation 1. Identify the foods that contain carbohydrates:  Rice.  Corn.  Milk.  Strawberries. 2. Calculate how many servings you have of each food:  2 servings rice.  1 serving corn.  1 serving milk.  1 serving strawberries. 3. Multiply each number of servings by 15 g:  2 servings rice x 15 g = 30 g.  1 serving corn x 15 g = 15 g.  1 serving milk x 15 g = 15  g.  1 serving strawberries x 15 g = 15 g. 4. Add together all of the amounts to find the total grams of carbohydrates eaten:  30 g + 15 g + 15 g + 15 g = 75 g of carbohydrates total. This information is not intended to replace advice given to you by your health care provider. Make sure you discuss any questions you have with your health care provider. Document Released: 10/29/2005 Document Revised: 05/18/2016 Document Reviewed: 04/11/2016 Elsevier Interactive Patient Education  2017 [employer]  

## 2016-12-04 NOTE — Progress Notes (Signed)
Pre visit review using our clinic review tool, if applicable. No additional management support is needed unless otherwise documented below in the visit note. 

## 2016-12-04 NOTE — Progress Notes (Signed)
Patient ID: Christina Mckinney, female   DOB: 10-Jul-1932, 83 y.o.   MRN: [id number]

## 2016-12-05 LAB — CBC
HCT: 38.1 % (ref 36.0–46.0)
Hemoglobin: 12.8 g/dL (ref 12.0–15.0)
MCHC: 33.7 g/dL (ref 30.0–36.0)
MCV: 94.2 fl (ref 78.0–100.0)
Platelets: 217 10*3/uL (ref 150.0–400.0)
RBC: 4.05 Mil/uL (ref 3.87–5.11)
RDW: 13.6 % (ref 11.5–15.5)
WBC: 7.1 10*3/uL (ref 4.0–10.5)

## 2016-12-05 LAB — COMPREHENSIVE METABOLIC PANEL
ALT: 8 U/L (ref 0–35)
AST: 24 U/L (ref 0–37)
Albumin: 4.6 g/dL (ref 3.5–5.2)
Alkaline Phosphatase: 78 U/L (ref 39–117)
BUN: 20 mg/dL (ref 6–23)
CHLORIDE: 103 meq/L (ref 96–112)
CO2: 30 meq/L (ref 19–32)
CREATININE: 1.26 mg/dL — AB (ref 0.40–1.20)
Calcium: 10.6 mg/dL — ABNORMAL HIGH (ref 8.4–10.5)
GFR: 43.12 mL/min — ABNORMAL LOW (ref >60.00)
Glucose, Bld: 90 mg/dL (ref 70–99)
Potassium: 3.9 mEq/L (ref 3.5–5.1)
SODIUM: 142 meq/L (ref 135–145)
Total Bilirubin: 0.6 mg/dL (ref 0.2–1.2)
Total Protein: 7.9 g/dL (ref 6.0–8.3)

## 2016-12-05 LAB — LIPID PANEL
CHOL/HDL RATIO: 4
Cholesterol: 222 mg/dL — ABNORMAL HIGH (ref 0–200)
HDL: 58.5 mg/dL (ref >39.00)
LDL CALC: 129 mg/dL — AB (ref 0–99)
NonHDL: 163.26
Triglycerides: 169 mg/dL — ABNORMAL HIGH (ref 0.0–149.0)
VLDL: 33.8 mg/dL (ref 0.0–40.0)

## 2016-12-05 LAB — TSH: TSH: 2.72 u[IU]/mL (ref 0.35–4.50)

## 2016-12-05 LAB — VITAMIN D 25 HYDROXY (VIT D DEFICIENCY, FRACTURES): VITD: 73.63 ng/mL (ref 30.00–100.00)

## 2016-12-05 LAB — HEMOGLOBIN A1C: HEMOGLOBIN A1C: 5.6 % (ref 4.6–6.5)

## 2016-12-07 MED ORDER — AMOXICILLIN 500 MG PO CAPS: 500.0000 mg | ORAL_CAPSULE | Freq: Three times a day (TID) | ORAL | 0 refills | Status: DC

## 2016-12-09 NOTE — Assessment & Plan Note (Signed)
hgba1c acceptable, minimize simple carbs. Increase exercise as tolerated. Continue current meds 
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
## 2016-12-09 NOTE — Assessment & Plan Note (Signed)
Well controlled, no changes to meds. Encouraged heart healthy diet such as the DASH diet and exercise as tolerated.  °

## 2017-02-14 ENCOUNTER — Other Ambulatory Visit: Payer: Self-pay | Admitting: Family Medicine

## 2017-02-14 DIAGNOSIS — M1712 Unilateral primary osteoarthritis, left knee: Secondary | ICD-10-CM | POA: Diagnosis not present

## 2017-02-14 MED ORDER — FUROSEMIDE 40 MG PO TABS: 40.0000 mg | ORAL_TABLET | Freq: Every day | ORAL | 1 refills | Status: DC

## 2017-02-14 MED ORDER — ALPRAZOLAM 0.5 MG PO TABS: 0.5000 mg | ORAL_TABLET | Freq: Every evening | ORAL | 1 refills | Status: DC | PRN

## 2017-02-14 MED ORDER — LOSARTAN POTASSIUM 50 MG PO TABS: 50.0000 mg | ORAL_TABLET | Freq: Every day | ORAL | 1 refills | Status: DC

## 2017-02-14 MED ORDER — GABAPENTIN 300 MG PO CAPS: 300.0000 mg | ORAL_CAPSULE | Freq: Every day | ORAL | 1 refills | Status: DC

## 2017-02-14 NOTE — Telephone Encounter (Signed)
Faxed hardcopy for [facility] to [facility] with 1 refill at [phone]

## 2017-02-14 NOTE — Telephone Encounter (Signed)
Requesting:   Alprazolam and Gabapepntin Contract   none UDS    none Last OV   12/04/2016 Last Refill   Alprazolam   #90 with 1 refill on 12/04/2016                    Gabapentin  #90 with 1 refill on 12/04/2016  Please Advise----[facility]----[phone]

## 2017-02-19 DIAGNOSIS — Z79899 Other long term (current) drug therapy: Secondary | ICD-10-CM | POA: Diagnosis not present

## 2017-02-19 DIAGNOSIS — K5790 Diverticulosis of intestine, part unspecified, without perforation or abscess without bleeding: Secondary | ICD-10-CM | POA: Diagnosis not present

## 2017-02-19 DIAGNOSIS — R1110 Vomiting, unspecified: Secondary | ICD-10-CM | POA: Diagnosis not present

## 2017-02-19 DIAGNOSIS — Z8371 Family history of colonic polyps: Secondary | ICD-10-CM | POA: Diagnosis not present

## 2017-02-19 DIAGNOSIS — Z800 Family history of malignant neoplasm of digestive organs: Secondary | ICD-10-CM | POA: Diagnosis not present

## 2017-02-19 DIAGNOSIS — K219 Gastro-esophageal reflux disease without esophagitis: Secondary | ICD-10-CM | POA: Diagnosis not present

## 2017-02-19 DIAGNOSIS — R42 Dizziness and giddiness: Secondary | ICD-10-CM | POA: Diagnosis not present

## 2017-02-21 DIAGNOSIS — M1712 Unilateral primary osteoarthritis, left knee: Secondary | ICD-10-CM | POA: Diagnosis not present

## 2017-02-22 ENCOUNTER — Other Ambulatory Visit: Payer: Self-pay | Admitting: Family Medicine

## 2017-03-07 DIAGNOSIS — M1712 Unilateral primary osteoarthritis, left knee: Secondary | ICD-10-CM | POA: Diagnosis not present

## 2017-03-13 ENCOUNTER — Telehealth: Payer: Self-pay | Admitting: Family Medicine

## 2017-03-13 DIAGNOSIS — R42 Dizziness and giddiness: Secondary | ICD-10-CM | POA: Diagnosis not present

## 2017-03-13 DIAGNOSIS — Z87891 Personal history of nicotine dependence: Secondary | ICD-10-CM | POA: Diagnosis not present

## 2017-03-13 DIAGNOSIS — G4733 Obstructive sleep apnea (adult) (pediatric): Secondary | ICD-10-CM | POA: Diagnosis not present

## 2017-03-13 DIAGNOSIS — R2681 Unsteadiness on feet: Secondary | ICD-10-CM | POA: Diagnosis not present

## 2017-03-13 DIAGNOSIS — G629 Polyneuropathy, unspecified: Secondary | ICD-10-CM | POA: Diagnosis not present

## 2017-03-13 DIAGNOSIS — Z9989 Dependence on other enabling machines and devices: Secondary | ICD-10-CM | POA: Diagnosis not present

## 2017-03-13 DIAGNOSIS — Z79899 Other long term (current) drug therapy: Secondary | ICD-10-CM | POA: Diagnosis not present

## 2017-03-13 DIAGNOSIS — G2581 Restless legs syndrome: Secondary | ICD-10-CM | POA: Diagnosis not present

## 2017-03-13 NOTE — Telephone Encounter (Signed)
Called patient to schedule awv. Lvm for patient to call office to schedule appt.  °

## 2017-04-09 ENCOUNTER — Encounter: Payer: Self-pay | Admitting: *Deleted

## 2017-04-09 ENCOUNTER — Ambulatory Visit (INDEPENDENT_AMBULATORY_CARE_PROVIDER_SITE_OTHER): Payer: Medicare Other | Admitting: *Deleted

## 2017-04-09 VITALS — BP 138/60 | HR 69 | Ht 61.0 in

## 2017-04-09 DIAGNOSIS — Z0000 Encounter for general adult medical examination without abnormal findings: Secondary | ICD-10-CM | POA: Diagnosis not present

## 2017-04-09 DIAGNOSIS — D229 Melanocytic nevi, unspecified: Secondary | ICD-10-CM

## 2017-04-09 NOTE — Patient Instructions (Addendum)
Christina Mckinney , Thank you for taking time to come for your Medicare Wellness Visit. I appreciate your ongoing commitment to your health goals. Please review the following plan we discussed and let me know if I can assist you in the future.   Create and bring a copy of your advance directives to your next office visit.  These are the goals we discussed: Goals    . Increase physical activity          Start walking.       This is a list of the screening recommended for you and due dates:  Health Maintenance  Topic Date Due  . Eye exam for diabetics  01/15/1942  . Hemoglobin A1C  06/03/2017  . Flu Shot  06/12/2017  . Complete foot exam   12/04/2017  . Tetanus Vaccine  04/30/2026  . DEXA scan (bone density measurement)  Completed  . Pneumonia vaccines  Completed   Health Maintenance, Female Adopting a healthy lifestyle and getting preventive care can go a long way to promote health and wellness. Talk with your health care provider about what schedule of regular examinations is right for you. This is a good chance for you to check in with your provider about disease prevention and staying healthy. In between checkups, there are plenty of things you can do on your own. Experts have done a lot of research about which lifestyle changes and preventive measures are most likely to keep you healthy. Ask your health care provider for more information. Weight and diet Eat a healthy diet  Be sure to include plenty of vegetables, fruits, low-fat dairy products, and lean protein.  Do not eat a lot of foods high in solid fats, added sugars, or salt.  Get regular exercise. This is one of the most important things you can do for your health.  Most adults should exercise for at least 150 minutes each week. The exercise should increase your heart rate and make you sweat (moderate-intensity exercise).  Most adults should also do strengthening exercises at least twice a week. This is in addition to the  moderate-intensity exercise. Maintain a healthy weight  Body mass index (BMI) is a measurement that can be used to identify possible weight problems. It estimates body fat based on height and weight. Your health care provider can help determine your BMI and help you achieve or maintain a healthy weight.  For females 61 years of age and older:  A BMI below 18.5 is considered underweight.  A BMI of 18.5 to 24.9 is normal.  A BMI of 25 to 29.9 is considered overweight.  A BMI of 30 and above is considered obese. Watch levels of cholesterol and blood lipids  You should start having your blood tested for lipids and cholesterol at 81 years of age, then have this test every 5 years.  You may need to have your cholesterol levels checked more often if:  Your lipid or cholesterol levels are high.  You are older than 81 years of age.  You are at high risk for heart disease. Cancer screening Lung Cancer  Lung cancer screening is recommended for adults 86-78 years old who are at high risk for lung cancer because of a history of smoking.  A yearly low-dose CT scan of the lungs is recommended for people who:  Currently smoke.  Have quit within the past 15 years.  Have at least a 30-pack-year history of smoking. A pack year is smoking an average of one  pack of cigarettes a day for 1 year.  Yearly screening should continue until it has been 15 years since you quit.  Yearly screening should stop if you develop a health problem that would prevent you from having lung cancer treatment. Breast Cancer  Practice breast self-awareness. This means understanding how your breasts normally appear and feel.  It also means doing regular breast self-exams. Let your health care provider know about any changes, no matter how small.  If you are in your 20s or 30s, you should have a clinical breast exam (CBE) by a health care provider every 1-3 years as part of a regular health exam.  If you are 33 or  older, have a CBE every year. Also consider having a breast X-ray (mammogram) every year.  If you have a family history of breast cancer, talk to your health care provider about genetic screening.  If you are at high risk for breast cancer, talk to your health care provider about having an MRI and a mammogram every year.  Breast cancer gene (BRCA) assessment is recommended for women who have family members with BRCA-related cancers. BRCA-related cancers include:  Breast.  Ovarian.  Tubal.  Peritoneal cancers.  Results of the assessment will determine the need for genetic counseling and BRCA1 and BRCA2 testing. Cervical Cancer  Your health care provider may recommend that you be screened regularly for cancer of the pelvic organs (ovaries, uterus, and vagina). This screening involves a pelvic examination, including checking for microscopic changes to the surface of your cervix (Pap test). You may be encouraged to have this screening done every 3 years, beginning at age 41.  For women ages 29-65, health care providers may recommend pelvic exams and Pap testing every 3 years, or they may recommend the Pap and pelvic exam, combined with testing for human papilloma virus (HPV), every 5 years. Some types of HPV increase your risk of cervical cancer. Testing for HPV may also be done on women of any age with unclear Pap test results.  Other health care providers may not recommend any screening for nonpregnant women who are considered low risk for pelvic cancer and who do not have symptoms. Ask your health care provider if a screening pelvic exam is right for you.  If you have had past treatment for cervical cancer or a condition that could lead to cancer, you need Pap tests and screening for cancer for at least 20 years after your treatment. If Pap tests have been discontinued, your risk factors (such as having a new sexual partner) need to be reassessed to determine if screening should resume. Some  women have medical problems that increase the chance of getting cervical cancer. In these cases, your health care provider may recommend more frequent screening and Pap tests. Colorectal Cancer  This type of cancer can be detected and often prevented.  Routine colorectal cancer screening usually begins at 81 years of age and continues through 81 years of age.  Your health care provider may recommend screening at an earlier age if you have risk factors for colon cancer.  Your health care provider may also recommend using home test kits to check for hidden blood in the stool.  A small camera at the end of a tube can be used to examine your colon directly (sigmoidoscopy or colonoscopy). This is done to check for the earliest forms of colorectal cancer.  Routine screening usually begins at age 65.  Direct examination of the colon should be repeated every  5-10 years through 81 years of age. However, you may need to be screened more often if early forms of precancerous polyps or small growths are found. Skin Cancer  Check your skin from head to toe regularly.  Tell your health care provider about any new moles or changes in moles, especially if there is a change in a mole's shape or color.  Also tell your health care provider if you have a mole that is larger than the size of a pencil eraser.  Always use sunscreen. Apply sunscreen liberally and repeatedly throughout the day.  Protect yourself by wearing long sleeves, pants, a wide-brimmed hat, and sunglasses whenever you are outside. Heart disease, diabetes, and high blood pressure  High blood pressure causes heart disease and increases the risk of stroke. High blood pressure is more likely to develop in:  People who have blood pressure in the high end of the normal range (130-139/85-89 mm Hg).  People who are overweight or obese.  People who are African American.  If you are 84-31 years of age, have your blood pressure checked every  3-5 years. If you are 42 years of age or older, have your blood pressure checked every year. You should have your blood pressure measured twice-once when you are at a hospital or clinic, and once when you are not at a hospital or clinic. Record the average of the two measurements. To check your blood pressure when you are not at a hospital or clinic, you can use:  An automated blood pressure machine at a pharmacy.  A home blood pressure monitor.  If you are between 41 years and 36 years old, ask your health care provider if you should take aspirin to prevent strokes.  Have regular diabetes screenings. This involves taking a blood sample to check your fasting blood sugar level.  If you are at a normal weight and have a low risk for diabetes, have this test once every three years after 81 years of age.  If you are overweight and have a high risk for diabetes, consider being tested at a younger age or more often. Preventing infection Hepatitis B  If you have a higher risk for hepatitis B, you should be screened for this virus. You are considered at high risk for hepatitis B if:  You were born in a country where hepatitis B is common. Ask your health care provider which countries are considered high risk.  Your parents were born in a high-risk country, and you have not been immunized against hepatitis B (hepatitis B vaccine).  You have HIV or AIDS.  You use needles to inject street drugs.  You live with someone who has hepatitis B.  You have had sex with someone who has hepatitis B.  You get hemodialysis treatment.  You take certain medicines for conditions, including cancer, organ transplantation, and autoimmune conditions. Hepatitis C  Blood testing is recommended for:  Everyone born from [birth year] through 1963.  Anyone with known risk factors for hepatitis C. Sexually transmitted infections (STIs)  You should be screened for sexually transmitted infections (STIs) including  gonorrhea and chlamydia if:  You are sexually active and are younger than 81 years of age.  You are older than 81 years of age and your health care provider tells you that you are at risk for this type of infection.  Your sexual activity has changed since you were last screened and you are at an increased risk for chlamydia or gonorrhea. Ask your health  care provider if you are at risk.  If you do not have HIV, but are at risk, it may be recommended that you take a prescription medicine daily to prevent HIV infection. This is called pre-exposure prophylaxis (PrEP). You are considered at risk if:  You are sexually active and do not regularly use condoms or know the HIV status of your partner(s).  You take drugs by injection.  You are sexually active with a partner who has HIV. Talk with your health care provider about whether you are at high risk of being infected with HIV. If you choose to begin PrEP, you should first be tested for HIV. You should then be tested every 3 months for as long as you are taking PrEP. Pregnancy  If you are premenopausal and you may become pregnant, ask your health care provider about preconception counseling.  If you may become pregnant, take 400 to 800 micrograms (mcg) of folic acid every day.  If you want to prevent pregnancy, talk to your health care provider about birth control (contraception). Osteoporosis and menopause  Osteoporosis is a disease in which the bones lose minerals and strength with aging. This can result in serious bone fractures. Your risk for osteoporosis can be identified using a bone density scan.  If you are 65 years of age or older, or if you are at risk for osteoporosis and fractures, ask your health care provider if you should be screened.  Ask your health care provider whether you should take a calcium or vitamin D supplement to lower your risk for osteoporosis.  Menopause may have certain physical symptoms and risks.  Hormone  replacement therapy may reduce some of these symptoms and risks. Talk to your health care provider about whether hormone replacement therapy is right for you. Follow these instructions at home:  Schedule regular health, dental, and eye exams.  Stay current with your immunizations.  Do not use any tobacco products including cigarettes, chewing tobacco, or electronic cigarettes.  If you are pregnant, do not drink alcohol.  If you are breastfeeding, limit how much and how often you drink alcohol.  Limit alcohol intake to no more than 1 drink per day for nonpregnant women. One drink equals 12 ounces of beer, 5 ounces of wine, or 1 ounces of hard liquor.  Do not use street drugs.  Do not share needles.  Ask your health care provider for help if you need support or information about quitting drugs.  Tell your health care provider if you often feel depressed.  Tell your health care provider if you have ever been abused or do not feel safe at home. This information is not intended to replace advice given to you by your health care provider. Make sure you discuss any questions you have with your health care provider. Document Released: 05/14/2011 Document Revised: 04/05/2016 Document Reviewed: 08/02/2015 Elsevier Interactive Patient Education  2017 [employer].
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
## 2017-04-22 ENCOUNTER — Encounter: Payer: Self-pay | Admitting: Family Medicine

## 2017-04-22 ENCOUNTER — Ambulatory Visit (INDEPENDENT_AMBULATORY_CARE_PROVIDER_SITE_OTHER): Payer: Medicare Other | Admitting: Family Medicine

## 2017-04-22 ENCOUNTER — Telehealth: Payer: Self-pay | Admitting: *Deleted

## 2017-04-22 VITALS — BP 120/72 | HR 67 | Temp 97.7°F | Ht 61.0 in | Wt 160.0 lb

## 2017-04-22 DIAGNOSIS — E782 Mixed hyperlipidemia: Secondary | ICD-10-CM

## 2017-04-22 DIAGNOSIS — R0600 Dyspnea, unspecified: Secondary | ICD-10-CM | POA: Diagnosis not present

## 2017-04-22 DIAGNOSIS — E119 Type 2 diabetes mellitus without complications: Secondary | ICD-10-CM | POA: Diagnosis not present

## 2017-04-22 DIAGNOSIS — R5381 Other malaise: Secondary | ICD-10-CM | POA: Diagnosis not present

## 2017-04-22 DIAGNOSIS — E039 Hypothyroidism, unspecified: Secondary | ICD-10-CM

## 2017-04-22 DIAGNOSIS — I10 Essential (primary) hypertension: Secondary | ICD-10-CM | POA: Diagnosis not present

## 2017-04-22 DIAGNOSIS — M109 Gout, unspecified: Secondary | ICD-10-CM | POA: Diagnosis not present

## 2017-04-22 MED ORDER — MONTELUKAST SODIUM 10 MG PO TABS: 10.0000 mg | ORAL_TABLET | Freq: Every day | ORAL | 3 refills | Status: DC

## 2017-04-22 NOTE — Assessment & Plan Note (Signed)
Well controlled, no changes to meds. Encouraged heart healthy diet such as the DASH diet and exercise as tolerated.  °

## 2017-04-22 NOTE — Assessment & Plan Note (Signed)
Encouraged heart healthy diet, increase exercise, avoid trans fats, consider a krill oil cap daily 

## 2017-04-22 NOTE — Assessment & Plan Note (Signed)
On Levothyroxine, continue to monitor 

## 2017-04-22 NOTE — Telephone Encounter (Signed)
Received request for Medical records from [facility] [place], forwarded to [place] for email/scan/SLS 06/11

## 2017-04-22 NOTE — Assessment & Plan Note (Signed)
Uric acid  Check today

## 2017-04-22 NOTE — Assessment & Plan Note (Signed)
minimize simple carbs. Increase exercise as tolerated. Continue current meds  

## 2017-04-22 NOTE — Progress Notes (Signed)
Pre visit review using our clinic review tool, if applicable. No additional management support is needed unless otherwise documented below in the visit note. 

## 2017-04-22 NOTE — Patient Instructions (Addendum)
Icy Hot, Aspercreme or Salon Pas Lidocaine patches [place], [place] [facility]  Carbohydrate Counting for Diabetes Mellitus, Adult Carbohydrate counting is a method for keeping track of how many carbohydrates you eat. Eating carbohydrates naturally increases the amount of sugar (glucose) in the blood. Counting how many carbohydrates you eat helps keep your blood glucose within normal limits, which helps you manage your diabetes (diabetes mellitus). It is important to know how many carbohydrates you can safely have in each meal. This is different for every person. A diet and nutrition specialist (registered dietitian) can help you make a meal plan and calculate how many carbohydrates you should have at each meal and snack. Carbohydrates are found in the following foods:  Grains, such as breads and cereals.  Dried beans and soy products.  Starchy vegetables, such as potatoes, peas, and corn.  Fruit and fruit juices.  Milk and yogurt.  Sweets and snack foods, such as cake, cookies, candy, chips, and soft drinks.  How do I count carbohydrates? There are two ways to count carbohydrates in food. You can use either of the methods or a combination of both. Reading "Nutrition Facts" on packaged food The "Nutrition Facts" list is included on the labels of almost all packaged foods and beverages in the [place] It includes:  The serving size.  Information about nutrients in each serving, including the grams (g) of carbohydrate per serving.  To use the "Nutrition Facts":  Decide how many servings you will have.  Multiply the number of servings by the number of carbohydrates per serving.  The resulting number is the total amount of carbohydrates that you will be having.  Learning standard serving sizes of other foods When you eat foods containing carbohydrates that are not packaged or do not include "Nutrition Facts" on the label, you need to measure the servings in order to  count the amount of carbohydrates:  Measure the foods that you will eat with a food scale or measuring cup, if needed.  Decide how many standard-size servings you will eat.  Multiply the number of servings by 15. Most carbohydrate-rich foods have about 15 g of carbohydrates per serving. ? For example, if you eat 8 oz (170 g) of strawberries, you will have eaten 2 servings and 30 g of carbohydrates (2 servings x 15 g = 30 g).  For foods that have more than one food mixed, such as soups and casseroles, you must count the carbohydrates in each food that is included.  The following list contains standard serving sizes of common carbohydrate-rich foods. Each of these servings has about 15 g of carbohydrates:   hamburger bun or  English muffin.   oz (15 mL) syrup.   oz (14 g) jelly.  1 slice of bread.  1 six-inch tortilla.  3 oz (85 g) cooked rice or pasta.  4 oz (113 g) cooked dried beans.  4 oz (113 g) starchy vegetable, such as peas, corn, or potatoes.  4 oz (113 g) hot cereal.  4 oz (113 g) mashed potatoes or  of a large baked potato.  4 oz (113 g) canned or frozen fruit.  4 oz (120 mL) fruit juice.  4-6 crackers.  6 chicken nuggets.  6 oz (170 g) unsweetened dry cereal.  6 oz (170 g) plain fat-free yogurt or yogurt sweetened with artificial sweeteners.  8 oz (240 mL) milk.  8 oz (170 g) fresh fruit or one small piece of fruit.  24 oz (680 g) popped popcorn.  Example of carbohydrate counting Sample meal  3 oz (85 g) chicken breast.  6 oz (170 g) brown rice.  4 oz (113 g) corn.  8 oz (240 mL) milk.  8 oz (170 g) strawberries with sugar-free whipped topping. Carbohydrate calculation 1. Identify the foods that contain carbohydrates: ? Rice. ? Corn. ? Milk. ? Strawberries. 2. Calculate how many servings you have of each food: ? 2 servings rice. ? 1 serving corn. ? 1 serving milk. ? 1 serving strawberries. 3. Multiply each number of servings by  15 g: ? 2 servings rice x 15 g = 30 g. ? 1 serving corn x 15 g = 15 g. ? 1 serving milk x 15 g = 15 g. ? 1 serving strawberries x 15 g = 15 g. 4. Add together all of the amounts to find the total grams of carbohydrates eaten: ? 30 g + 15 g + 15 g + 15 g = 75 g of carbohydrates total. This information is not intended to replace advice given to you by your health care provider. Make sure you discuss any questions you have with your health care provider. Document Released: 10/29/2005 Document Revised: 05/18/2016 Document Reviewed: 04/11/2016 Elsevier Interactive Patient Education  [employer].

## 2017-04-23 LAB — COMPREHENSIVE METABOLIC PANEL
ALBUMIN: 4.3 g/dL (ref 3.5–5.2)
ALT: 7 U/L (ref 0–35)
AST: 22 U/L (ref 0–37)
Alkaline Phosphatase: 65 U/L (ref 39–117)
BUN: 19 mg/dL (ref 6–23)
CALCIUM: 10.9 mg/dL — AB (ref 8.4–10.5)
CHLORIDE: 103 meq/L (ref 96–112)
CO2: 33 mEq/L — ABNORMAL HIGH (ref 19–32)
CREATININE: 1.37 mg/dL — AB (ref 0.40–1.20)
GFR: 39.11 mL/min — AB (ref >60.00)
Glucose, Bld: 96 mg/dL (ref 70–99)
POTASSIUM: 4.8 meq/L (ref 3.5–5.1)
Sodium: 141 mEq/L (ref 135–145)
Total Bilirubin: 0.5 mg/dL (ref 0.2–1.2)
Total Protein: 7.3 g/dL (ref 6.0–8.3)

## 2017-04-23 LAB — CBC
HEMATOCRIT: 39.9 % (ref 36.0–46.0)
HEMOGLOBIN: 13.2 g/dL (ref 12.0–15.0)
MCHC: 33 g/dL (ref 30.0–36.0)
MCV: 97 fl (ref 78.0–100.0)
PLATELETS: 212 10*3/uL (ref 150.0–400.0)
RBC: 4.11 Mil/uL (ref 3.87–5.11)
RDW: 14 % (ref 11.5–15.5)
WBC: 8.4 10*3/uL (ref 4.0–10.5)

## 2017-04-23 LAB — LDL CHOLESTEROL, DIRECT: Direct LDL: 96 mg/dL

## 2017-04-23 LAB — LIPID PANEL
CHOLESTEROL: 194 mg/dL (ref 0–200)
HDL: 47.5 mg/dL (ref >39.00)
NonHDL: 146.56
TRIGLYCERIDES: 349 mg/dL — AB (ref 0.0–149.0)
Total CHOL/HDL Ratio: 4
VLDL: 69.8 mg/dL — ABNORMAL HIGH (ref 0.0–40.0)

## 2017-04-23 LAB — TSH: TSH: 2.11 u[IU]/mL (ref 0.35–4.50)

## 2017-04-23 LAB — HEMOGLOBIN A1C: Hgb A1c MFr Bld: 5.9 % (ref 4.6–6.5)

## 2017-04-23 LAB — URIC ACID: URIC ACID, SERUM: 4.8 mg/dL (ref 2.4–7.0)

## 2017-04-23 NOTE — Progress Notes (Signed)
Patient ID: Christina Mckinney, female   DOB: 02/08/1932, 83 y.o.   MRN: [id number]   Subjective:    Patient ID: Christina Mckinney, female    DOB: [birth year], 83 y.o.   MRN: [id number]  Chief Complaint  Patient presents with  . Follow-up    4 month    HPI Patient is in today for follow up. She feels well today but she does endorse worsening dyspnea on exertion, no PND, CP or palpitations. No recent febrile illness or hospitalizations. She also notes just being frustrated with being unsteady and weaker since she has not been exercising. Denies CP/palp/HA/congestion/fevers/GI or GU c/o. Taking meds as prescribed   Past Medical History:  Diagnosis Date  . Anemia, unspecified   . Anxiety and depression 09/18/2015  . Anxiety state, unspecified   . Chest pain   . Chronic systolic CHF (congestive heart failure) ([place]) 08/12/2015  . Depression with anxiety 06/02/2007   Qualifier: Diagnosis of  By: Wynona Luna   . Depressive disorder, not elsewhere classified   . Diverticulitis   . DVT (deep venous thrombosis) ([place])   . Dyspnea 07/22/2015  . Edema 06/12/2014  . Essential and other specified forms of tremor   . GERD (gastroesophageal reflux disease)   . Gout, unspecified   . Internal hemorrhoids without mention of complication   . Macular degeneration 08/07/2015  . Osteoarthritis   . Other abnormal glucose   . Other and unspecified hyperlipidemia   . Personal history of colonic polyps   . Personal history of peptic ulcer disease   . Pulmonary embolism ([place])   . Unspecified disorder of bladder   . Unspecified essential hypertension   . Unspecified hypothyroidism   . Varicose veins     Past Surgical History:  Procedure Laterality Date  . ABDOMINAL HYSTERECTOMY    . CHOLECYSTECTOMY    . ENDOVENOUS ABLATION SAPHENOUS VEIN W/ LASER  09-11-2012   left greater saphenous vein   by Curt Jews MD  . EYE SURGERY    . KNEE ARTHROSCOPY     right   . NASAL SEPTUM SURGERY    . SHOULDER SURGERY    .  TOTAL KNEE ARTHROPLASTY  09/2013   Right    Family History  Problem Relation Age of Onset  . Other Mother        varicose veins  . Hip fracture Mother   . Heart disease Father        CHF  . Hyperlipidemia Father   . Heart attack Father   . Diabetes Daughter   . Heart disease Daughter   . Hyperlipidemia Daughter   . Hypertension Daughter   . Aneurysm Daughter   . Cancer Sister        stomach  . Heart disease Sister   . Heart disease Brother   . Colon cancer Neg Hx     Social History   Social History  . Marital status: Widowed    Spouse name: N/A  . Number of children: 3  . Years of education: N/A   Occupational History  . Retired     Social History Main Topics  . Smoking status: Former Smoker    Types: Cigarettes    Quit date: 11/12/1988  . Smokeless tobacco: Never Used  . Alcohol use 2.4 oz/week    4 Standard drinks or equivalent per week     Comment: occasional wine   . Drug use: No  . Sexual activity: No  Comment: lives alone, no dietary restrictions, widowed   Other Topics Concern  . Not on file   Social History Narrative  . No narrative on file    Outpatient Medications Prior to Visit  Medication Sig Dispense Refill  . ALPRAZolam (XANAX) 0.5 MG tablet Take 1 tablet (0.5 mg total) by mouth at bedtime as needed for anxiety. 90 tablet 1  . aspirin 81 MG tablet Take 1 tablet (81 mg total) by mouth daily. 30 tablet   . azelastine (ASTELIN) 137 MCG/SPRAY nasal spray Place 1 spray into the nose daily as needed. Use in each nostril as directed    . Cholecalciferol (VITAMIN D) 2000 UNITS tablet Take 6,000 Units by mouth daily.     Marland Kitchen co-enzyme Q-10 30 MG capsule Take 30 mg by mouth daily.     . fenofibrate micronized (ANTARA) 130 MG capsule Take 1 capsule (130 mg total) by mouth daily before breakfast. Failed Statins, Simvastatin, Atorvastatin, Rosuvastatin 90 capsule 1  . fluticasone (FLONASE) 50 MCG/ACT nasal spray USE 2 SPRAYS IN EACH NOSTRIL DAILY AS  NEEDED FOR ALLERGIES OR RHINITIS 48 g 1  . fluticasone (FLOVENT HFA) 110 MCG/ACT inhaler Inhale 2 puffs into the lungs 2 (two) times daily. 3 Inhaler 3  . furosemide (LASIX) 40 MG tablet Take 1 tablet (40 mg total) by mouth daily. as directed 90 tablet 1  . gabapentin (NEURONTIN) 300 MG capsule Take 1 capsule (300 mg total) by mouth at bedtime. 90 capsule 1  . lansoprazole (PREVACID) 30 MG capsule TAKE 1 CAPSULE TWICE A DAY BEFORE MEALS 180 capsule 0  . levothyroxine (SYNTHROID, LEVOTHROID) 50 MCG tablet TAKE 1 TABLET DAILY BEFORE BREAKFAST 90 tablet 1  . LORazepam (ATIVAN) 0.5 MG tablet Take 1 tablet (0.5 mg total) by mouth 2 (two) times daily as needed for anxiety. 180 tablet 1  . losartan (COZAAR) 100 MG tablet Take 1 tablet (100 mg total) by mouth daily. 90 tablet 1  . PATADAY 0.2 % SOLN Place 1 drop into both eyes daily.    Marland Kitchen PROAIR HFA 108 (90 Base) MCG/ACT inhaler USE 2 INHALATIONS EVERY 6 HOURS AS NEEDED FOR WHEEZING OR SHORTNESS OF BREATH 25.5 g 1  . propranolol ER (INDERAL LA) 60 MG 24 hr capsule Take 1 capsule (60 mg total) by mouth daily. 90 capsule 1  . ranitidine (ZANTAC) 150 MG capsule Take 1 capsule (150 mg total) by mouth daily. 90 capsule 1  . sucralfate (CARAFATE) 1 g tablet Take 1 tablet (1 g total) by mouth 4 (four) times daily. 360 tablet 0  . traMADol (ULTRAM) 50 MG tablet Take 1 tablet (50 mg total) by mouth daily as needed. 30 tablet 0  . trimethoprim-polymyxin b (POLYTRIM) ophthalmic solution Place 1 drop into the left eye every 6 (six) hours. 10 mL 0  . ULORIC 40 MG tablet TAKE 1 TABLET DAILY 90 tablet 1  . guaiFENesin (MUCINEX) 600 MG 12 hr tablet Take 2 tablets (1,200 mg total) by mouth 2 (two) times daily.    Marland Kitchen losartan (COZAAR) 50 MG tablet Take 1 tablet (50 mg total) by mouth daily. 90 tablet 1   No facility-administered medications prior to visit.     Allergies  Allergen Reactions  . Simvastatin Other (See Comments)    Hip pain  . Crestor [Rosuvastatin]      Left hip pain  . Lipitor [Atorvastatin]     Pain in hips  . Red Dye     Hip pain  Review of Systems  Constitutional: Positive for malaise/fatigue. Negative for fever.  HENT: Negative for congestion.   Eyes: Negative for blurred vision.  Respiratory: Positive for shortness of breath.   Cardiovascular: Negative for chest pain, palpitations, leg swelling and PND.  Gastrointestinal: Negative for abdominal pain, blood in stool and nausea.  Genitourinary: Negative for dysuria and frequency.  Musculoskeletal: Negative for falls.  Skin: Negative for rash.  Neurological: Negative for dizziness, loss of consciousness and headaches.  Endo/Heme/Allergies: Negative for environmental allergies.  Psychiatric/Behavioral: Negative for depression. The patient is not nervous/anxious.        Objective:    Physical Exam  Constitutional: She is oriented to person, place, and time. She appears well-developed and well-nourished. No distress.  HENT:  Head: Normocephalic and atraumatic.  Nose: Nose normal.  Eyes: Right eye exhibits no discharge. Left eye exhibits no discharge.  Neck: Normal range of motion. Neck supple.  Cardiovascular: Normal rate and regular rhythm.   Murmur heard. Pulmonary/Chest: Effort normal and breath sounds normal.  Abdominal: Soft. Bowel sounds are normal. There is no tenderness.  Musculoskeletal: She exhibits no edema.  Neurological: She is alert and oriented to person, place, and time.  Skin: Skin is warm and dry.  Psychiatric: She has a normal mood and affect.  Nursing note and vitals reviewed.   BP 120/72 (BP Location: Left Arm, Patient Position: Sitting, Cuff Size: Normal)   Pulse 67   Temp 97.7 F (36.5 C) (Oral)   Ht 5\' 1"  (1.549 m)   Wt 160 lb (72.6 kg)   SpO2 96%   BMI 30.23 kg/m  Wt Readings from Last 3 Encounters:  04/22/17 160 lb (72.6 kg)  12/04/16 157 lb (71.2 kg)  08/30/16 160 lb (72.6 kg)     Lab Results  Component Value Date   WBC 7.1  12/04/2016   HGB 12.8 12/04/2016   HCT 38.1 12/04/2016   PLT 217.0 12/04/2016   GLUCOSE 90 12/04/2016   CHOL 222 (H) 12/04/2016   TRIG 169.0 (H) 12/04/2016   HDL 58.50 12/04/2016   LDLDIRECT 96.0 08/23/2016   LDLCALC 129 (H) 12/04/2016   ALT 8 12/04/2016   AST 24 12/04/2016   NA 142 12/04/2016   K 3.9 12/04/2016   CL 103 12/04/2016   CREATININE 1.26 (H) 12/04/2016   BUN 20 12/04/2016   CO2 30 12/04/2016   TSH 2.72 12/04/2016   INR 1.0 07/21/2010   HGBA1C 5.6 12/04/2016   MICROALBUR 0.9 01/31/2016    Lab Results  Component Value Date   TSH 2.72 12/04/2016   Lab Results  Component Value Date   WBC 7.1 12/04/2016   HGB 12.8 12/04/2016   HCT 38.1 12/04/2016   MCV 94.2 12/04/2016   PLT 217.0 12/04/2016   Lab Results  Component Value Date   NA 142 12/04/2016   K 3.9 12/04/2016   CO2 30 12/04/2016   GLUCOSE 90 12/04/2016   BUN 20 12/04/2016   CREATININE 1.26 (H) 12/04/2016   BILITOT 0.6 12/04/2016   ALKPHOS 78 12/04/2016   AST 24 12/04/2016   ALT 8 12/04/2016   PROT 7.9 12/04/2016   ALBUMIN 4.6 12/04/2016   CALCIUM 10.6 (H) 12/04/2016   ANIONGAP 11 04/20/2015   GFR 43.12 (L) 12/04/2016   Lab Results  Component Value Date   CHOL 222 (H) 12/04/2016   Lab Results  Component Value Date   HDL 58.50 12/04/2016   Lab Results  Component Value Date   LDLCALC 129 (H) 12/04/2016  Lab Results  Component Value Date   TRIG 169.0 (H) 12/04/2016   Lab Results  Component Value Date   CHOLHDL 4 12/04/2016   Lab Results  Component Value Date   HGBA1C 5.6 12/04/2016       Assessment & Plan:   Problem List Items Addressed This Visit    Hypothyroidism    On Levothyroxine, continue to monitor      Relevant Orders   TSH   Hyperlipidemia, mixed    Encouraged heart healthy diet, increase exercise, avoid trans fats, consider a krill oil cap daily      Relevant Orders   Lipid panel   Gout    Uric acid  Check today      Relevant Orders   Uric acid    Essential hypertension    Well controlled, no changes to meds. Encouraged heart healthy diet such as the DASH diet and exercise as tolerated.       Relevant Orders   CBC   Comprehensive metabolic panel   Diabetes mellitus without complication (HCC)    minimize simple carbs. Increase exercise as tolerated. Continue current meds      Relevant Orders   Hemoglobin A1c   Dyspnea - Primary    She reports worsening will proceed with repeat echo       Relevant Orders   ECHOCARDIOGRAM COMPLETE   Physical debility    She is referred to physical therapy for supervised exercise and evaluation      Relevant Orders   Ambulatory referral to Physical Therapy      I have discontinued Ms. Seehafer's guaiFENesin. I am also having her start on montelukast. Additionally, I am having her maintain her azelastine, Vitamin D, co-enzyme Q-10, aspirin, traMADol, PATADAY, trimethoprim-polymyxin b, lansoprazole, LORazepam, fluticasone, losartan, propranolol ER, fenofibrate micronized, fluticasone, ULORIC, PROAIR HFA, sucralfate, ranitidine, furosemide, ALPRAZolam, gabapentin, and levothyroxine.  Meds ordered this encounter  Medications  . montelukast (SINGULAIR) 10 MG tablet    Sig: Take 1 tablet (10 mg total) by mouth at bedtime.    Dispense:  30 tablet    Refill:  3      [name], MD

## 2017-04-23 NOTE — Assessment & Plan Note (Signed)
She is referred to physical therapy for supervised exercise and evaluation

## 2017-04-23 NOTE — Assessment & Plan Note (Signed)
She reports worsening will proceed with repeat echo

## 2017-04-25 ENCOUNTER — Other Ambulatory Visit: Payer: Self-pay | Admitting: Family Medicine

## 2017-05-02 ENCOUNTER — Ambulatory Visit (HOSPITAL_BASED_OUTPATIENT_CLINIC_OR_DEPARTMENT_OTHER)
Admission: RE | Admit: 2017-05-02 | Discharge: 2017-05-02 | Disposition: A | Discharge status: Home / Self Care | Payer: Medicare Other | Source: Ambulatory Visit | Attending: Family Medicine | Admitting: Family Medicine

## 2017-05-02 DIAGNOSIS — E785 Hyperlipidemia, unspecified: Secondary | ICD-10-CM | POA: Diagnosis not present

## 2017-05-02 DIAGNOSIS — R0600 Dyspnea, unspecified: Secondary | ICD-10-CM | POA: Diagnosis not present

## 2017-05-02 DIAGNOSIS — E119 Type 2 diabetes mellitus without complications: Secondary | ICD-10-CM | POA: Insufficient documentation

## 2017-05-02 DIAGNOSIS — I10 Essential (primary) hypertension: Secondary | ICD-10-CM | POA: Insufficient documentation

## 2017-05-02 DIAGNOSIS — E039 Hypothyroidism, unspecified: Secondary | ICD-10-CM | POA: Diagnosis not present

## 2017-05-02 NOTE — Progress Notes (Signed)
  Echocardiogram 2D Echocardiogram has been performed.  Christina Mckinney 05/02/2017, 10:31 AM

## 2017-05-07 ENCOUNTER — Encounter: Payer: Self-pay | Admitting: Physical Therapy

## 2017-05-07 ENCOUNTER — Ambulatory Visit: Payer: Medicare Other | Attending: Family Medicine | Admitting: Physical Therapy

## 2017-05-07 DIAGNOSIS — R42 Dizziness and giddiness: Secondary | ICD-10-CM | POA: Diagnosis not present

## 2017-05-07 DIAGNOSIS — R2689 Other abnormalities of gait and mobility: Secondary | ICD-10-CM | POA: Diagnosis not present

## 2017-05-07 DIAGNOSIS — R2681 Unsteadiness on feet: Secondary | ICD-10-CM | POA: Insufficient documentation
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
## 2017-05-09 ENCOUNTER — Other Ambulatory Visit: Payer: Self-pay | Admitting: Family Medicine

## 2017-05-15 ENCOUNTER — Other Ambulatory Visit: Payer: Self-pay | Admitting: Family Medicine

## 2017-05-23 ENCOUNTER — Ambulatory Visit: Payer: Medicare Other | Admitting: Physical Therapy

## 2017-05-23 DIAGNOSIS — D1801 Hemangioma of skin and subcutaneous tissue: Secondary | ICD-10-CM | POA: Diagnosis not present

## 2017-05-23 DIAGNOSIS — L821 Other seborrheic keratosis: Secondary | ICD-10-CM | POA: Diagnosis not present

## 2017-05-23 DIAGNOSIS — L304 Erythema intertrigo: Secondary | ICD-10-CM | POA: Diagnosis not present

## 2017-05-27 ENCOUNTER — Ambulatory Visit: Payer: Medicare Other | Attending: Family Medicine | Admitting: Physical Therapy

## 2017-05-27 ENCOUNTER — Encounter: Payer: Self-pay | Admitting: Physical Therapy

## 2017-05-27 VITALS — BP 136/84 | HR 71

## 2017-05-27 DIAGNOSIS — R2689 Other abnormalities of gait and mobility: Secondary | ICD-10-CM | POA: Diagnosis not present

## 2017-05-27 DIAGNOSIS — R2681 Unsteadiness on feet: Secondary | ICD-10-CM | POA: Insufficient documentation

## 2017-05-27 DIAGNOSIS — R42 Dizziness and giddiness: Secondary | ICD-10-CM | POA: Insufficient documentation

## 2017-05-27 NOTE — Patient Instructions (Signed)
Gaze Stabilization: Tip Card  1.Target must remain in focus, not blurry, and appear stationary while head is in motion. 2.Perform exercises with small head movements (45 to either side of midline). 3.Increase speed of head motion so long as target is in focus. 4.If you wear eyeglasses, be sure you can see target through lens (therapist will give specific instructions for bifocal / progressive lenses). 5.These exercises may provoke dizziness or nausea. Work through these symptoms. If too dizzy, slow head movement slightly. Rest between each exercise. 6.Exercises demand concentration; avoid distractions.  Copyright  VHI. All rights reserved.     Special Instructions: Exercises may bring on mild to moderate symptoms of blurry vision that resolve within 30 minutes of completing exercises. If symptoms are lasting longer than 30 minutes, modify your exercises by:  >decreasing the # of times you complete each activity >ensuring your symptoms return to baseline before moving onto the next exercise >dividing up exercises so you do not do them all in one session, but multiple short sessions throughout the day >doing them once a day until symptoms improve   Gaze Stabilization: Sitting    Keeping eyes on target on wall 3 feet away, and move head side to side for _60___ seconds. Repeat while moving head up and down for __60__ seconds. Do __3__ sessions per day. MORNING, NOON, NIGHT  Copyright  VHI. All rights reserved.    Before you take your medicine, do 5-10 sit to stand and see if you get dizzy. Then practice looking down to see if you get dizzy. After you take your medicine, try the same activities to compare.
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
## 2017-05-29 ENCOUNTER — Ambulatory Visit: Payer: Medicare Other

## 2017-05-29 DIAGNOSIS — R42 Dizziness and giddiness: Secondary | ICD-10-CM | POA: Diagnosis not present

## 2017-05-29 DIAGNOSIS — R2689 Other abnormalities of gait and mobility: Secondary | ICD-10-CM

## 2017-05-29 DIAGNOSIS — R2681 Unsteadiness on feet: Secondary | ICD-10-CM
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
## 2017-05-29 NOTE — Patient Instructions (Signed)
Gaze Stabilization: Tip Card  1.Target must remain in focus, not blurry, and appear stationary while head is in motion. 2.Perform exercises with small head movements (45 to either side of midline). 3.Increase speed of head motion so long as target is in focus. 4.If you wear eyeglasses, be sure you can see target through lens (therapist will give specific instructions for bifocal / progressive lenses). 5.These exercises may provoke dizziness or nausea. Work through these symptoms. If too dizzy, slow head movement slightly. Rest between each exercise. 6.Exercises demand concentration; avoid distractions. 7.For safety, perform standing exercises close to a counter, wall, corner, or next to someone.  Copyright  VHI. All rights reserved.    Gaze Stabilization: Standing Feet Apart    Feet shoulder width apart, keeping eyes on target on wall _3-5___ feet away, tilt head down 15-30 and move head side to side for ___20-30_ seconds. Repeat while moving head up and down for _20-30___ seconds. Do __2-3__ sessions per day.  Copyright  VHI. All rights reserved.

## 2017-05-31 ENCOUNTER — Telehealth: Payer: Self-pay | Admitting: Family Medicine

## 2017-05-31 NOTE — Telephone Encounter (Signed)
Gave patient results she voiced her understanding.   Pc

## 2017-05-31 NOTE — Telephone Encounter (Signed)
Caller name: Akua Blethen Relationship to patient: self Can be reached: [phone]  Reason for call: pt returning call from June for results of echo

## 2017-06-03 ENCOUNTER — Ambulatory Visit: Payer: Medicare Other | Admitting: Physical Therapy

## 2017-06-03 ENCOUNTER — Encounter: Payer: Self-pay | Admitting: Physical Therapy

## 2017-06-03 VITALS — BP 104/69 | HR 74

## 2017-06-03 DIAGNOSIS — R2689 Other abnormalities of gait and mobility: Secondary | ICD-10-CM

## 2017-06-03 DIAGNOSIS — R2681 Unsteadiness on feet: Secondary | ICD-10-CM

## 2017-06-03 DIAGNOSIS — R42 Dizziness and giddiness: Secondary | ICD-10-CM | POA: Diagnosis not present
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
## 2017-06-04 ENCOUNTER — Telehealth: Payer: Self-pay | Admitting: Family Medicine

## 2017-06-04 ENCOUNTER — Other Ambulatory Visit: Payer: Self-pay | Admitting: Family Medicine

## 2017-06-04 DIAGNOSIS — R0602 Shortness of breath: Secondary | ICD-10-CM

## 2017-06-04 DIAGNOSIS — K2100 Gastro-esophageal reflux disease with esophagitis, without bleeding: Secondary | ICD-10-CM

## 2017-06-04 DIAGNOSIS — I10 Essential (primary) hypertension: Secondary | ICD-10-CM

## 2017-06-04 DIAGNOSIS — R0600 Dyspnea, unspecified: Secondary | ICD-10-CM

## 2017-06-04 DIAGNOSIS — Z23 Encounter for immunization: Secondary | ICD-10-CM

## 2017-06-04 DIAGNOSIS — K5900 Constipation, unspecified: Secondary | ICD-10-CM

## 2017-06-04 DIAGNOSIS — H8110 Benign paroxysmal vertigo, unspecified ear: Secondary | ICD-10-CM

## 2017-06-04 DIAGNOSIS — E119 Type 2 diabetes mellitus without complications: Secondary | ICD-10-CM

## 2017-06-04 NOTE — Telephone Encounter (Signed)
protonix 40 mg #30  1 po qd m          11 refills

## 2017-06-04 NOTE — Telephone Encounter (Signed)
Please advise    PC 

## 2017-06-04 NOTE — Telephone Encounter (Signed)
Pt would like to know if she can switch from Prilosec to Protonix for her acid reflux   If so pt would like to have it sent to [facility] script

## 2017-06-05 ENCOUNTER — Ambulatory Visit: Payer: Medicare Other | Admitting: Physical Therapy

## 2017-06-05 ENCOUNTER — Encounter: Payer: Self-pay | Admitting: Physical Therapy

## 2017-06-05 DIAGNOSIS — R42 Dizziness and giddiness: Secondary | ICD-10-CM | POA: Diagnosis not present

## 2017-06-05 DIAGNOSIS — R2681 Unsteadiness on feet: Secondary | ICD-10-CM

## 2017-06-05 DIAGNOSIS — R2689 Other abnormalities of gait and mobility: Secondary | ICD-10-CM
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
## 2017-06-06 ENCOUNTER — Telehealth: Payer: Self-pay | Admitting: Family Medicine

## 2017-06-06 MED ORDER — PANTOPRAZOLE SODIUM 40 MG PO TBEC: 40.0000 mg | DELAYED_RELEASE_TABLET | Freq: Every day | ORAL | 11 refills | Status: DC

## 2017-06-06 NOTE — Telephone Encounter (Signed)
X- press script called in to get the okay to provide pt with a 90 day supply. Per [facility]) it is okay to give a 90 day supply.

## 2017-06-06 NOTE — Telephone Encounter (Signed)
rx sent   pc 

## 2017-06-10 ENCOUNTER — Encounter: Payer: Self-pay | Admitting: Physical Therapy

## 2017-06-10 ENCOUNTER — Ambulatory Visit: Payer: Medicare Other | Admitting: Physical Therapy

## 2017-06-10 DIAGNOSIS — R42 Dizziness and giddiness: Secondary | ICD-10-CM | POA: Diagnosis not present

## 2017-06-10 DIAGNOSIS — R2681 Unsteadiness on feet: Secondary | ICD-10-CM

## 2017-06-10 DIAGNOSIS — R2689 Other abnormalities of gait and mobility: Secondary | ICD-10-CM | POA: Diagnosis not present
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
## 2017-06-12 ENCOUNTER — Ambulatory Visit: Payer: Medicare Other | Attending: Family Medicine | Admitting: Physical Therapy

## 2017-06-12 ENCOUNTER — Encounter: Payer: Self-pay | Admitting: Physical Therapy

## 2017-06-12 DIAGNOSIS — R2681 Unsteadiness on feet: Secondary | ICD-10-CM | POA: Diagnosis not present

## 2017-06-12 DIAGNOSIS — H8113 Benign paroxysmal vertigo, bilateral: Secondary | ICD-10-CM | POA: Insufficient documentation

## 2017-06-12 DIAGNOSIS — R42 Dizziness and giddiness: Secondary | ICD-10-CM

## 2017-06-12 DIAGNOSIS — R2689 Other abnormalities of gait and mobility: Secondary | ICD-10-CM | POA: Diagnosis not present
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
## 2017-06-17 ENCOUNTER — Encounter: Payer: Self-pay | Admitting: Physical Therapy

## 2017-06-17 ENCOUNTER — Ambulatory Visit: Payer: Medicare Other | Admitting: Physical Therapy

## 2017-06-17 DIAGNOSIS — R42 Dizziness and giddiness: Secondary | ICD-10-CM | POA: Diagnosis not present

## 2017-06-17 DIAGNOSIS — H8113 Benign paroxysmal vertigo, bilateral: Secondary | ICD-10-CM | POA: Diagnosis not present

## 2017-06-17 DIAGNOSIS — R2681 Unsteadiness on feet: Secondary | ICD-10-CM | POA: Diagnosis not present

## 2017-06-17 DIAGNOSIS — R2689 Other abnormalities of gait and mobility: Secondary | ICD-10-CM | POA: Diagnosis not present
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
## 2017-06-19 ENCOUNTER — Ambulatory Visit: Payer: Medicare Other

## 2017-06-19 DIAGNOSIS — R2681 Unsteadiness on feet: Secondary | ICD-10-CM | POA: Diagnosis not present

## 2017-06-19 DIAGNOSIS — R2689 Other abnormalities of gait and mobility: Secondary | ICD-10-CM

## 2017-06-19 DIAGNOSIS — R42 Dizziness and giddiness: Secondary | ICD-10-CM

## 2017-06-19 DIAGNOSIS — H8113 Benign paroxysmal vertigo, bilateral: Secondary | ICD-10-CM | POA: Diagnosis not present

## 2017-06-19 NOTE — Patient Instructions (Signed)
Sit to Side-Lying    Sit on edge of bed. 1. Turn head 45 to right. 2. Maintain head position and lie down slowly on left side. Hold until symptoms subside plus 30 seconds. 3. Sit up slowly. Hold until symptoms subside. 4. Turn head 45 to left. 5. Maintain head position and lie down slowly on right side. Hold until symptoms subside plus 30 seconds. 6. Sit up slowly. Repeat sequence __3-5__ times per session. Do __1__ sessions per day.  Copyright  VHI. All rights reserved.
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
## 2017-06-24 ENCOUNTER — Ambulatory Visit: Payer: Medicare Other | Admitting: Physical Therapy

## 2017-06-24 DIAGNOSIS — R42 Dizziness and giddiness: Secondary | ICD-10-CM | POA: Diagnosis not present

## 2017-06-24 DIAGNOSIS — R2681 Unsteadiness on feet: Secondary | ICD-10-CM

## 2017-06-24 DIAGNOSIS — R2689 Other abnormalities of gait and mobility: Secondary | ICD-10-CM

## 2017-06-24 DIAGNOSIS — H8113 Benign paroxysmal vertigo, bilateral: Secondary | ICD-10-CM | POA: Diagnosis not present
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
## 2017-06-26 ENCOUNTER — Ambulatory Visit: Payer: Medicare Other | Admitting: Physical Therapy

## 2017-06-26 ENCOUNTER — Encounter: Payer: Self-pay | Admitting: Physical Therapy

## 2017-06-26 DIAGNOSIS — R2681 Unsteadiness on feet: Secondary | ICD-10-CM | POA: Diagnosis not present

## 2017-06-26 DIAGNOSIS — R42 Dizziness and giddiness: Secondary | ICD-10-CM

## 2017-06-26 DIAGNOSIS — H8113 Benign paroxysmal vertigo, bilateral: Secondary | ICD-10-CM | POA: Diagnosis not present

## 2017-06-26 DIAGNOSIS — R2689 Other abnormalities of gait and mobility: Secondary | ICD-10-CM

## 2017-06-26 NOTE — Patient Instructions (Signed)
Heel Raises    Stand with support. With knees straight, raise heels off ground. Hold _2__ seconds.  Repeat _20__ times. Do __2_ times a day.  Tandem Walking    Walk with each foot directly in front of other, heel of one foot touching toes of other foot with each step. Both feet straight ahead.  Hold on to top of counter for support! Perform 4-6 laps along counter.  Walking on Heels    Walk on heels for around [place] or along counter while continuing on a straight path.  Hold on to top of counter for support Do _2__ sessions per day.  Gastroc / Heel Cord Stretch - On Step    Stand with heels over edge of stair. Holding rail, lower heels until stretch is felt in calf of legs.  Hold 30 seconds Repeat _2__ times. Do _2__ times per day.  Copyright  VHI. All rights reserved.
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
## 2017-07-01 ENCOUNTER — Encounter: Payer: Self-pay | Admitting: Physical Therapy

## 2017-07-01 ENCOUNTER — Ambulatory Visit: Payer: Medicare Other | Admitting: Physical Therapy

## 2017-07-01 DIAGNOSIS — R2689 Other abnormalities of gait and mobility: Secondary | ICD-10-CM

## 2017-07-01 DIAGNOSIS — R42 Dizziness and giddiness: Secondary | ICD-10-CM | POA: Diagnosis not present

## 2017-07-01 DIAGNOSIS — R2681 Unsteadiness on feet: Secondary | ICD-10-CM | POA: Diagnosis not present

## 2017-07-01 DIAGNOSIS — H8113 Benign paroxysmal vertigo, bilateral: Secondary | ICD-10-CM | POA: Diagnosis not present
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
## 2017-07-02 ENCOUNTER — Ambulatory Visit: Payer: Medicare Other

## 2017-07-03 ENCOUNTER — Ambulatory Visit: Payer: Medicare Other

## 2017-07-03 DIAGNOSIS — R42 Dizziness and giddiness: Secondary | ICD-10-CM

## 2017-07-03 DIAGNOSIS — R2681 Unsteadiness on feet: Secondary | ICD-10-CM

## 2017-07-03 DIAGNOSIS — R2689 Other abnormalities of gait and mobility: Secondary | ICD-10-CM | POA: Diagnosis not present

## 2017-07-03 DIAGNOSIS — H8113 Benign paroxysmal vertigo, bilateral: Secondary | ICD-10-CM
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
## 2017-07-08 ENCOUNTER — Encounter: Payer: Self-pay | Admitting: Physical Therapy

## 2017-07-08 ENCOUNTER — Ambulatory Visit: Payer: Medicare Other | Admitting: Physical Therapy

## 2017-07-08 VITALS — BP 113/63 | HR 69

## 2017-07-08 DIAGNOSIS — R42 Dizziness and giddiness: Secondary | ICD-10-CM | POA: Diagnosis not present

## 2017-07-08 DIAGNOSIS — R2681 Unsteadiness on feet: Secondary | ICD-10-CM

## 2017-07-08 DIAGNOSIS — R2689 Other abnormalities of gait and mobility: Secondary | ICD-10-CM

## 2017-07-08 DIAGNOSIS — H8113 Benign paroxysmal vertigo, bilateral: Secondary | ICD-10-CM
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
## 2017-07-09 ENCOUNTER — Ambulatory Visit (INDEPENDENT_AMBULATORY_CARE_PROVIDER_SITE_OTHER): Payer: Medicare Other | Admitting: Family Medicine

## 2017-07-09 ENCOUNTER — Encounter: Payer: Self-pay | Admitting: Family Medicine

## 2017-07-09 VITALS — BP 126/60 | HR 66 | Temp 98.4°F | Resp 18 | Wt 160.0 lb

## 2017-07-09 DIAGNOSIS — Z23 Encounter for immunization: Secondary | ICD-10-CM

## 2017-07-09 DIAGNOSIS — J449 Chronic obstructive pulmonary disease, unspecified: Secondary | ICD-10-CM

## 2017-07-09 DIAGNOSIS — I10 Essential (primary) hypertension: Secondary | ICD-10-CM

## 2017-07-09 DIAGNOSIS — I6523 Occlusion and stenosis of bilateral carotid arteries: Secondary | ICD-10-CM

## 2017-07-09 DIAGNOSIS — K219 Gastro-esophageal reflux disease without esophagitis: Secondary | ICD-10-CM | POA: Diagnosis not present

## 2017-07-09 DIAGNOSIS — E039 Hypothyroidism, unspecified: Secondary | ICD-10-CM

## 2017-07-09 DIAGNOSIS — E782 Mixed hyperlipidemia: Secondary | ICD-10-CM | POA: Diagnosis not present

## 2017-07-09 MED ORDER — MONTELUKAST SODIUM 10 MG PO TABS: 10.0000 mg | ORAL_TABLET | Freq: Every day | ORAL | 1 refills | Status: DC

## 2017-07-09 MED ORDER — PANTOPRAZOLE SODIUM 40 MG PO TBEC: 40.0000 mg | DELAYED_RELEASE_TABLET | Freq: Two times a day (BID) | ORAL | 5 refills | Status: DC

## 2017-07-09 MED ORDER — FLUTICASONE PROPIONATE 50 MCG/ACT NA SUSP: NASAL | 1 refills | Status: DC

## 2017-07-09 NOTE — Assessment & Plan Note (Signed)
Well controlled, no changes to meds. Encouraged heart healthy diet such as the DASH diet and exercise as tolerated.  °

## 2017-07-09 NOTE — Progress Notes (Addendum)
Subjective:    Patient ID: Christina Mckinney, female    DOB: [birth year], 83 y.o.   MRN: [id number]  No chief complaint on file.   HPI  Patient is in today for follow up. She is feeling well today. No recent febrile illness or hospitalization. She is taking the Protonix one tab daily and her heartburn is controlled most of the time. No bloody or tarry stool, no abdominal pain or change in appetite. Denies CP/palp/SOB/HA/congestion/fevers or GU c/o. Taking meds as prescribed  Patient Care Team: Mosie Lukes, MD as PCP - General (Family Medicine) Martinique, Peter M, MD as Consulting Physician (Cardiology) Arvella Nigh, MD as Consulting Physician (Obstetrics and Gynecology) Rutherford Guys, MD as Consulting Physician (Ophthalmology) Mickel Fuchs, MD as Consulting Physician (Neurology) Aviva Signs, MD as Consulting Physician (Gastroenterology) Franchot Mimes, Teodora Medici, MD as Consulting Physician (Pulmonary Disease)   Past Medical History:  Diagnosis Date  . Anemia, unspecified   . Anxiety and depression 09/18/2015  . Anxiety state, unspecified   . Chest pain   . Chronic systolic CHF (congestive heart failure) ([place]) 08/12/2015  . Depression with anxiety 06/02/2007   Qualifier: Diagnosis of  By: Wynona Luna   . Depressive disorder, not elsewhere classified   . Diverticulitis   . DVT (deep venous thrombosis) ([place])   . Dyspnea 07/22/2015  . Edema 06/12/2014  . Essential and other specified forms of tremor   . GERD (gastroesophageal reflux disease)   . Gout, unspecified   . Internal hemorrhoids without mention of complication   . Macular degeneration 08/07/2015  . Osteoarthritis   . Other abnormal glucose   . Other and unspecified hyperlipidemia   . Personal history of colonic polyps   . Personal history of peptic ulcer disease   . Pulmonary embolism ([place])   . Unspecified disorder of bladder   . Unspecified essential hypertension   . Unspecified hypothyroidism   . Varicose veins      Past Surgical History:  Procedure Laterality Date  . ABDOMINAL HYSTERECTOMY    . CHOLECYSTECTOMY    . ENDOVENOUS ABLATION SAPHENOUS VEIN W/ LASER  09-11-2012   left greater saphenous vein   by Curt Jews MD  . EYE SURGERY    . KNEE ARTHROSCOPY     right   . NASAL SEPTUM SURGERY    . SHOULDER SURGERY    . TOTAL KNEE ARTHROPLASTY  09/2013   Right    Family History  Problem Relation Age of Onset  . Other Mother        varicose veins  . Hip fracture Mother   . Heart disease Father        CHF  . Hyperlipidemia Father   . Heart attack Father   . Diabetes Daughter   . Heart disease Daughter   . Hyperlipidemia Daughter   . Hypertension Daughter   . Aneurysm Daughter   . Cancer Sister        stomach  . Heart disease Sister   . Heart disease Brother   . Colon cancer Neg Hx     Social History   Social History  . Marital status: Widowed    Spouse name: N/A  . Number of children: 3  . Years of education: N/A   Occupational History  . Retired     Social History Main Topics  . Smoking status: Former Smoker    Types: Cigarettes    Quit date: 11/12/1988  . Smokeless tobacco: Never Used  .  Alcohol use 2.4 oz/week    4 Standard drinks or equivalent per week     Comment: occasional wine   . Drug use: No  . Sexual activity: No     Comment: lives alone, no dietary restrictions, widowed   Other Topics Concern  . Not on file   Social History Narrative  . No narrative on file    Outpatient Medications Prior to Visit  Medication Sig Dispense Refill  . ALPRAZolam (XANAX) 0.5 MG tablet Take 1 tablet (0.5 mg total) by mouth at bedtime as needed for anxiety. 90 tablet 1  . aspirin 81 MG tablet Take 1 tablet (81 mg total) by mouth daily. 30 tablet   . azelastine (ASTELIN) 137 MCG/SPRAY nasal spray Place 1 spray into the nose daily as needed. Use in each nostril as directed    . Cholecalciferol (VITAMIN D) 2000 UNITS tablet Take 6,000 Units by mouth daily.     Marland Kitchen co-enzyme  Q-10 30 MG capsule Take 30 mg by mouth daily.     . fenofibrate micronized (ANTARA) 130 MG capsule Take 1 capsule (130 mg total) by mouth daily before breakfast. Failed Statins, Simvastatin, Atorvastatin, Rosuvastatin 90 capsule 1  . fluticasone (FLOVENT HFA) 110 MCG/ACT inhaler Inhale 2 puffs into the lungs 2 (two) times daily. 3 Inhaler 3  . furosemide (LASIX) 40 MG tablet Take 1 tablet (40 mg total) by mouth daily. as directed 90 tablet 1  . losartan (COZAAR) 100 MG tablet Take 1 tablet (100 mg total) by mouth daily. 90 tablet 1  . PATADAY 0.2 % SOLN Place 1 drop into both eyes daily.    Marland Kitchen PROAIR HFA 108 (90 Base) MCG/ACT inhaler USE 2 INHALATIONS EVERY 6 HOURS AS NEEDED FOR WHEEZING OR SHORTNESS OF BREATH 25.5 g 1  . propranolol ER (INDERAL LA) 60 MG 24 hr capsule TAKE 1 CAPSULE DAILY 90 capsule 1  . ranitidine (ZANTAC) 150 MG capsule TAKE 1 CAPSULE DAILY 90 capsule 1  . sucralfate (CARAFATE) 1 g tablet Take 1 tablet (1 g total) by mouth 4 (four) times daily. 360 tablet 0  . ULORIC 40 MG tablet TAKE 1 TABLET DAILY 90 tablet 1  . fluticasone (FLONASE) 50 MCG/ACT nasal spray USE 2 SPRAYS IN EACH NOSTRIL DAILY AS NEEDED FOR ALLERGIES OR RHINITIS 48 g 1  . gabapentin (NEURONTIN) 300 MG capsule Take 1 capsule (300 mg total) by mouth at bedtime. 90 capsule 1  . levothyroxine (SYNTHROID, LEVOTHROID) 50 MCG tablet TAKE 1 TABLET DAILY BEFORE BREAKFAST 90 tablet 1  . montelukast (SINGULAIR) 10 MG tablet Take 1 tablet (10 mg total) by mouth at bedtime. 30 tablet 3  . pantoprazole (PROTONIX) 40 MG tablet Take 1 tablet (40 mg total) by mouth daily. 30 tablet 11  . lansoprazole (PREVACID) 30 MG capsule TAKE 1 CAPSULE TWICE A DAY BEFORE MEALS 180 capsule 0  . LORazepam (ATIVAN) 0.5 MG tablet Take 1 tablet (0.5 mg total) by mouth 2 (two) times daily as needed for anxiety. 180 tablet 1  . traMADol (ULTRAM) 50 MG tablet Take 1 tablet (50 mg total) by mouth daily as needed. (Patient not taking: Reported on  05/07/2017) 30 tablet 0  . trimethoprim-polymyxin b (POLYTRIM) ophthalmic solution Place 1 drop into the left eye every 6 (six) hours. (Patient not taking: Reported on 05/07/2017) 10 mL 0   No facility-administered medications prior to visit.     Allergies  Allergen Reactions  . Simvastatin Other (See Comments)    Hip pain  .  Crestor [Rosuvastatin]     Left hip pain  . Lipitor [Atorvastatin]     Pain in hips  . Red Dye     Hip pain    Review of Systems  Constitutional: Negative for fever and malaise/fatigue.  HENT: Negative for congestion.   Eyes: Negative for blurred vision.  Respiratory: Negative for shortness of breath.   Cardiovascular: Negative for chest pain, palpitations and leg swelling.  Gastrointestinal: Positive for heartburn. Negative for abdominal pain, blood in stool and nausea.  Genitourinary: Negative for dysuria and frequency.  Musculoskeletal: Negative for falls.  Skin: Negative for rash.  Neurological: Negative for dizziness, loss of consciousness and headaches.  Endo/Heme/Allergies: Negative for environmental allergies.  Psychiatric/Behavioral: Negative for depression. The patient is not nervous/anxious.        Objective:    Physical Exam  Constitutional: She is oriented to person, place, and time. She appears well-developed and well-nourished. No distress.  HENT:  Head: Normocephalic and atraumatic.  Nose: Nose normal.  Eyes: Right eye exhibits no discharge. Left eye exhibits no discharge.  Neck: Normal range of motion. Neck supple.  Cardiovascular: Normal rate and regular rhythm.   No murmur heard. Pulmonary/Chest: Effort normal and breath sounds normal.  Abdominal: Soft. Bowel sounds are normal. There is no tenderness.  Musculoskeletal: She exhibits no edema.  Neurological: She is alert and oriented to person, place, and time.  Skin: Skin is warm and dry.  Psychiatric: She has a normal mood and affect.  Nursing note and vitals reviewed.   BP  126/60 (BP Location: Left Arm, Patient Position: Sitting, Cuff Size: Normal)   Pulse 66   Temp 98.4 F (36.9 C) (Oral)   Resp 18   Wt 160 lb (72.6 kg)   SpO2 97%   BMI 30.23 kg/m  Wt Readings from Last 3 Encounters:  07/09/17 160 lb (72.6 kg)  04/22/17 160 lb (72.6 kg)  12/04/16 157 lb (71.2 kg)   BP Readings from Last 3 Encounters:  07/09/17 126/60  07/08/17 113/63  06/03/17 104/69     Immunization History  Administered Date(s) Administered  . DTP 11/11/2001  . Influenza Whole 08/06/2007, 07/29/2008, 08/05/2009, 07/18/2010  . Influenza, High Dose Seasonal PF 07/22/2015, 08/30/2016, 07/09/2017  . Influenza,inj,Quad PF,6+ Mos 07/12/2014, 09/01/2014  . Pneumococcal Conjugate-13 11/12/2003, 09/21/2013  . Pneumococcal Polysaccharide-23 03/15/2003, 07/29/2008, 08/30/2016  . Td 03/31/2001, 04/30/2016  . Zoster 04/02/2008    Health Maintenance  Topic Date Due  . OPHTHALMOLOGY EXAM  01/15/1942  . HEMOGLOBIN A1C  10/22/2017  . FOOT EXAM  12/04/2017  . TETANUS/TDAP  04/30/2026  . INFLUENZA VACCINE  Completed  . DEXA SCAN  Completed  . PNA vac Low Risk Adult  Completed    Lab Results  Component Value Date   WBC 8.4 04/22/2017   HGB 13.2 04/22/2017   HCT 39.9 04/22/2017   PLT 212.0 04/22/2017   GLUCOSE 96 04/22/2017   CHOL 194 04/22/2017   TRIG 349.0 (H) 04/22/2017   HDL 47.50 04/22/2017   LDLDIRECT 96.0 04/22/2017   LDLCALC 129 (H) 12/04/2016   ALT 7 04/22/2017   AST 22 04/22/2017   NA 141 04/22/2017   K 4.8 04/22/2017   CL 103 04/22/2017   CREATININE 1.37 (H) 04/22/2017   BUN 19 04/22/2017   CO2 33 (H) 04/22/2017   TSH 2.11 04/22/2017   INR 1.0 07/21/2010   HGBA1C 5.9 04/22/2017   MICROALBUR 0.9 01/31/2016    Lab Results  Component Value Date   TSH 2.11  04/22/2017   Lab Results  Component Value Date   WBC 8.4 04/22/2017   HGB 13.2 04/22/2017   HCT 39.9 04/22/2017   MCV 97.0 04/22/2017   PLT 212.0 04/22/2017   Lab Results  Component Value Date    NA 141 04/22/2017   K 4.8 04/22/2017   CO2 33 (H) 04/22/2017   GLUCOSE 96 04/22/2017   BUN 19 04/22/2017   CREATININE 1.37 (H) 04/22/2017   BILITOT 0.5 04/22/2017   ALKPHOS 65 04/22/2017   AST 22 04/22/2017   ALT 7 04/22/2017   PROT 7.3 04/22/2017   ALBUMIN 4.3 04/22/2017   CALCIUM 10.9 (H) 04/22/2017   ANIONGAP 11 04/20/2015   GFR 39.11 (L) 04/22/2017   Lab Results  Component Value Date   CHOL 194 04/22/2017   Lab Results  Component Value Date   HDL 47.50 04/22/2017   Lab Results  Component Value Date   LDLCALC 129 (H) 12/04/2016   Lab Results  Component Value Date   TRIG 349.0 (H) 04/22/2017   Lab Results  Component Value Date   CHOLHDL 4 04/22/2017   Lab Results  Component Value Date   HGBA1C 5.9 04/22/2017         Assessment & Plan:   Problem List Items Addressed This Visit    Hypothyroidism    On Levothyroxine, continue to monitor      Hyperlipidemia, mixed    Encouraged heart healthy diet, increase exercise, avoid trans fats, consider a krill oil cap daily      Essential hypertension    Well controlled, no changes to meds. Encouraged heart healthy diet such as the DASH diet and exercise as tolerated.       Bilateral carotid artery disease (HCC)    Moderate per last carotid ultrasound in 2016, new ultrasound ordered for surveillance      GERD    Well treatedon Protonix daily can add a dose of Ranitidine qhs. Minimize offending foods.       Relevant Medications   pantoprazole (PROTONIX) 40 MG tablet   COPD (chronic obstructive pulmonary disease) (HCC)    No recent exacerbation. Hi dose flu shot given today.      Relevant Medications   montelukast (SINGULAIR) 10 MG tablet   fluticasone (FLONASE) 50 MCG/ACT nasal spray    Other Visit Diagnoses    Needs flu shot    -  Primary   Relevant Orders   Flu vaccine HIGH DOSE PF (Fluzone High dose) (Completed)      I have discontinued Ms. Berne's traMADol, trimethoprim-polymyxin b,  lansoprazole, and LORazepam. I have also changed her pantoprazole. Additionally, I am having her maintain her azelastine, Vitamin D, co-enzyme Q-10, aspirin, PATADAY, fluticasone, losartan, fenofibrate micronized, PROAIR HFA, sucralfate, furosemide, ALPRAZolam, ULORIC, ranitidine, propranolol ER, montelukast, and fluticasone.  Meds ordered this encounter  Medications  . pantoprazole (PROTONIX) 40 MG tablet    Sig: Take 1 tablet (40 mg total) by mouth 2 (two) times daily.    Dispense:  180 tablet    Refill:  5  . montelukast (SINGULAIR) 10 MG tablet    Sig: Take 1 tablet (10 mg total) by mouth at bedtime.    Dispense:  90 tablet    Refill:  1  . fluticasone (FLONASE) 50 MCG/ACT nasal spray    Sig: USE 2 SPRAYS IN EACH NOSTRIL DAILY AS NEEDED FOR ALLERGIES OR RHINITIS    Dispense:  48 g    Refill:  L[place],  MD

## 2017-07-09 NOTE — Patient Instructions (Addendum)
Call [facility] and see if they can add a humidifier to your CPAP  Hydrate well every day Sleep Apnea Sleep apnea is a condition in which breathing pauses or becomes shallow during sleep. Episodes of sleep apnea usually last 10 seconds or longer, and they may occur as many as 20 times an hour. Sleep apnea disrupts your sleep and keeps your body from getting the rest that it needs. This condition can increase your risk of certain health problems, including:  Heart attack.  Stroke.  Obesity.  Diabetes.  Heart failure.  Irregular heartbeat.  There are three kinds of sleep apnea:  Obstructive sleep apnea. This kind is caused by a blocked or collapsed airway.  Central sleep apnea. This kind happens when the part of the brain that controls breathing does not send the correct signals to the muscles that control breathing.  Mixed sleep apnea. This is a combination of obstructive and central sleep apnea.  What are the causes? The most common cause of this condition is a collapsed or blocked airway. An airway can collapse or become blocked if:  Your throat muscles are abnormally relaxed.  Your tongue and tonsils are larger than normal.  You are overweight.  Your airway is smaller than normal.  What increases the risk? This condition is more likely to develop in people who:  Are overweight.  Smoke.  Have a smaller than normal airway.  Are elderly.  Are female.  Drink alcohol.  Take sedatives or tranquilizers.  Have a family history of sleep apnea.  What are the signs or symptoms? Symptoms of this condition include:  Trouble staying asleep.  Daytime sleepiness and tiredness.  Irritability.  Loud snoring.  Morning headaches.  Trouble concentrating.  Forgetfulness.  Decreased interest in sex.  Unexplained sleepiness.  Mood swings.  Personality changes.  Feelings of depression.  Waking up often during the night to urinate.  Dry mouth.  Sore  throat.  How is this diagnosed? This condition may be diagnosed with:  A medical history.  A physical exam.  A series of tests that are done while you are sleeping (sleep study). These tests are usually done in a sleep lab, but they may also be done at home.  How is this treated? Treatment for this condition aims to restore normal breathing and to ease symptoms during sleep. It may involve managing health issues that can affect breathing, such as high blood pressure or obesity. Treatment may include:  Sleeping on your side.  Using a decongestant if you have nasal congestion.  Avoiding the use of depressants, including alcohol, sedatives, and narcotics.  Losing weight if you are overweight.  Making changes to your diet.  Quitting smoking.  Using a device to open your airway while you sleep, such as: ? An oral appliance. This is a custom-made mouthpiece that shifts your lower jaw forward. ? A continuous positive airway pressure (CPAP) device. This device delivers oxygen to your airway through a mask. ? A nasal expiratory positive airway pressure (EPAP) device. This device has valves that you put into each nostril. ? A bi-level positive airway pressure (BPAP) device. This device delivers oxygen to your airway through a mask.  Surgery if other treatments do not work. During surgery, excess tissue is removed to create a wider airway.  It is important to get treatment for sleep apnea. Without treatment, this condition can lead to:  High blood pressure.  Coronary artery disease.  (Men) An inability to achieve or maintain an erection (impotence).  Reduced thinking abilities.  Follow these instructions at home:  Make any lifestyle changes that your health care provider recommends.  Eat a healthy, well-balanced diet.  Take over-the-counter and prescription medicines only as told by your health care provider.  Avoid using depressants, including alcohol, sedatives, and  narcotics.  Take steps to lose weight if you are overweight.  If you were given a device to open your airway while you sleep, use it only as told by your health care provider.  Do not use any tobacco products, such as cigarettes, chewing tobacco, and e-cigarettes. If you need help quitting, ask your health care provider.  Keep all follow-up visits as told by your health care provider. This is important. Contact a health care provider if:  The device that you received to open your airway during sleep is uncomfortable or does not seem to be working.  Your symptoms do not improve.  Your symptoms get worse. Get help right away if:  You develop chest pain.  You develop shortness of breath.  You develop discomfort in your back, arms, or stomach.  You have trouble speaking.  You have weakness on one side of your body.  You have drooping in your face. These symptoms may represent a serious problem that is an emergency. Do not wait to see if the symptoms will go away. Get medical help right away. Call your local emergency services (911 in the [place] Do not drive yourself to the hospital. This information is not intended to replace advice given to you by your health care provider. Make sure you discuss any questions you have with your health care provider. Document Released: 10/19/2002 Document Revised: 06/24/2016 Document Reviewed: 08/08/2015 Elsevier Interactive Patient Education  [employer].

## 2017-07-09 NOTE — Assessment & Plan Note (Signed)
No recent exacerbation. Hi dose flu shot given today.

## 2017-07-10 ENCOUNTER — Encounter: Payer: Medicare Other | Admitting: Physical Therapy

## 2017-07-11 ENCOUNTER — Ambulatory Visit: Payer: Medicare Other

## 2017-07-11 DIAGNOSIS — R2681 Unsteadiness on feet: Secondary | ICD-10-CM | POA: Diagnosis not present

## 2017-07-11 DIAGNOSIS — R42 Dizziness and giddiness: Secondary | ICD-10-CM

## 2017-07-11 DIAGNOSIS — H8113 Benign paroxysmal vertigo, bilateral: Secondary | ICD-10-CM | POA: Diagnosis not present

## 2017-07-11 DIAGNOSIS — R2689 Other abnormalities of gait and mobility: Secondary | ICD-10-CM | POA: Diagnosis not present

## 2017-07-11 NOTE — Patient Instructions (Signed)
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
## 2017-07-15 NOTE — Assessment & Plan Note (Signed)
Encouraged heart healthy diet, increase exercise, avoid trans fats, consider a krill oil cap daily 

## 2017-07-15 NOTE — Assessment & Plan Note (Signed)
On Levothyroxine, continue to monitor 

## 2017-07-15 NOTE — Assessment & Plan Note (Signed)
Well treatedon Protonix daily can add a dose of Ranitidine qhs. Minimize offending foods.

## 2017-07-16 ENCOUNTER — Ambulatory Visit: Payer: Medicare Other | Attending: Family Medicine | Admitting: Physical Therapy

## 2017-07-16 ENCOUNTER — Encounter: Payer: Self-pay | Admitting: Physical Therapy

## 2017-07-16 DIAGNOSIS — R2681 Unsteadiness on feet: Secondary | ICD-10-CM | POA: Diagnosis not present

## 2017-07-16 DIAGNOSIS — H8113 Benign paroxysmal vertigo, bilateral: Secondary | ICD-10-CM | POA: Diagnosis not present

## 2017-07-16 DIAGNOSIS — R42 Dizziness and giddiness: Secondary | ICD-10-CM | POA: Insufficient documentation

## 2017-07-16 DIAGNOSIS — R2689 Other abnormalities of gait and mobility: Secondary | ICD-10-CM | POA: Diagnosis not present
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
## 2017-07-18 ENCOUNTER — Encounter: Payer: Medicare Other | Admitting: Physical Therapy

## 2017-07-18 ENCOUNTER — Ambulatory Visit: Payer: Medicare Other

## 2017-07-18 DIAGNOSIS — R2689 Other abnormalities of gait and mobility: Secondary | ICD-10-CM

## 2017-07-18 DIAGNOSIS — R42 Dizziness and giddiness: Secondary | ICD-10-CM

## 2017-07-18 DIAGNOSIS — R2681 Unsteadiness on feet: Secondary | ICD-10-CM | POA: Diagnosis not present

## 2017-07-18 DIAGNOSIS — H8113 Benign paroxysmal vertigo, bilateral: Secondary | ICD-10-CM | POA: Diagnosis not present
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
## 2017-07-18 NOTE — Patient Instructions (Signed)
Heel Raises    Stand with support. With knees straight, raise heels off ground. Hold _2__ seconds.  Repeat _20__ times. Do __1_ times a day. Perform 2-3 times a week. YOU CAN ALSO ROCK BACK ONTO YOUR HEELS AND LIFT TOES X20 REPS.   Tandem Walking    Walk with each foot directly in front of other, heel of one foot touching toes of other foot with each step. Both feet straight ahead.  Hold on to top of counter for support! Perform 4-6 laps along counter. Perform 2-3 times per week.   Walking on Heels    Walk on heels for around [place] or along counter while continuing on a straight path.  Hold on to top of counter for support. Perform 4 laps.  Do _1__ sessions per day. 2-3 times per week.   Gastroc / Heel Cord Stretch - On Step    Stand with heels over edge of stair. Holding rail, lower heels until stretch is felt in calf of legs.  Hold 30 seconds Repeat _2__ times. Do _2__ times per day.  Copyright  VHI. All rights reserved.   Gaze Stabilization: Tip Card  1.Target must remain in focus, not blurry, and appear stationary while head is in motion. 2.Perform exercises with small head movements (45 to either side of midline). 3.Increase speed of head motion so long as target is in focus. 4.If you wear eyeglasses, be sure you can see target through lens (therapist will give specific instructions for bifocal / progressive lenses). 5.These exercises may provoke dizziness or nausea. Work through these symptoms. If too dizzy, slow head movement slightly. Rest between each exercise. 6.Exercises demand concentration; avoid distractions. 7.For safety, perform standing exercises close to a counter, wall, corner, or next to someone.  Copyright  VHI. All rights reserved.    Gaze Stabilization: Standing Feet Apart    Feet shoulder width apart, keeping eyes on target on wall _3-5___ feet away, tilt head down 15-30 and move head side to side for ___20-30_ seconds. Repeat while  moving head up and down for _20-30___ seconds. Do __2-3__ sessions per day.  1-2 times a week.   Copyright  VHI. All rights reserved.

## 2017-08-07 ENCOUNTER — Other Ambulatory Visit: Payer: Self-pay | Admitting: Family Medicine

## 2017-08-12 DIAGNOSIS — Z961 Presence of intraocular lens: Secondary | ICD-10-CM | POA: Diagnosis not present

## 2017-08-12 DIAGNOSIS — H04123 Dry eye syndrome of bilateral lacrimal glands: Secondary | ICD-10-CM | POA: Diagnosis not present

## 2017-08-21 ENCOUNTER — Other Ambulatory Visit: Payer: Self-pay | Admitting: Family Medicine

## 2017-08-23 ENCOUNTER — Other Ambulatory Visit: Payer: Self-pay

## 2017-08-23 MED ORDER — GABAPENTIN 300 MG PO CAPS: 300.0000 mg | ORAL_CAPSULE | Freq: Every day | ORAL | 1 refills | Status: DC

## 2017-08-27 ENCOUNTER — Other Ambulatory Visit: Payer: Self-pay | Admitting: Family Medicine

## 2017-08-27 DIAGNOSIS — I6523 Occlusion and stenosis of bilateral carotid arteries: Secondary | ICD-10-CM

## 2017-08-27 NOTE — Assessment & Plan Note (Signed)
Moderate per last carotid ultrasound in 2016, new ultrasound ordered for surveillance

## 2017-08-29 ENCOUNTER — Ambulatory Visit (HOSPITAL_COMMUNITY)
Admission: RE | Admit: 2017-08-29 | Discharge: 2017-08-29 | Disposition: A | Discharge status: Home / Self Care | Payer: Medicare Other | Source: Ambulatory Visit | Attending: Cardiology | Admitting: Cardiology

## 2017-08-29 DIAGNOSIS — I6523 Occlusion and stenosis of bilateral carotid arteries: Secondary | ICD-10-CM | POA: Diagnosis not present

## 2017-08-31 ENCOUNTER — Other Ambulatory Visit: Payer: Self-pay | Admitting: Family Medicine

## 2017-09-04 ENCOUNTER — Other Ambulatory Visit: Payer: Self-pay

## 2017-09-04 DIAGNOSIS — I6523 Occlusion and stenosis of bilateral carotid arteries: Secondary | ICD-10-CM

## 2017-09-10 ENCOUNTER — Ambulatory Visit (HOSPITAL_BASED_OUTPATIENT_CLINIC_OR_DEPARTMENT_OTHER): Payer: Medicare Other
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
## 2017-10-07 DIAGNOSIS — R2689 Other abnormalities of gait and mobility: Secondary | ICD-10-CM | POA: Diagnosis not present

## 2017-10-07 DIAGNOSIS — J324 Chronic pansinusitis: Secondary | ICD-10-CM | POA: Diagnosis not present

## 2017-10-07 DIAGNOSIS — R05 Cough: Secondary | ICD-10-CM | POA: Diagnosis not present

## 2017-10-07 DIAGNOSIS — K219 Gastro-esophageal reflux disease without esophagitis: Secondary | ICD-10-CM | POA: Diagnosis not present

## 2017-10-25 DIAGNOSIS — Z87891 Personal history of nicotine dependence: Secondary | ICD-10-CM | POA: Diagnosis not present

## 2017-10-25 DIAGNOSIS — J449 Chronic obstructive pulmonary disease, unspecified: Secondary | ICD-10-CM | POA: Diagnosis not present

## 2017-10-25 DIAGNOSIS — Z86711 Personal history of pulmonary embolism: Secondary | ICD-10-CM | POA: Diagnosis not present

## 2017-10-25 DIAGNOSIS — G4733 Obstructive sleep apnea (adult) (pediatric): Secondary | ICD-10-CM | POA: Diagnosis not present

## 2017-10-25 DIAGNOSIS — R490 Dysphonia: Secondary | ICD-10-CM | POA: Diagnosis not present

## 2017-10-25 DIAGNOSIS — N183 Chronic kidney disease, stage 3 (moderate): Secondary | ICD-10-CM | POA: Diagnosis not present

## 2017-10-25 DIAGNOSIS — Z800 Family history of malignant neoplasm of digestive organs: Secondary | ICD-10-CM | POA: Diagnosis not present

## 2017-10-25 DIAGNOSIS — Z79899 Other long term (current) drug therapy: Secondary | ICD-10-CM | POA: Diagnosis not present

## 2017-11-05 ENCOUNTER — Other Ambulatory Visit: Payer: Self-pay | Admitting: Family Medicine

## 2017-11-06 ENCOUNTER — Telehealth: Payer: Self-pay | Admitting: Family Medicine

## 2017-11-06 MED ORDER — ALPRAZOLAM 0.5 MG PO TABS: 0.5000 mg | ORAL_TABLET | Freq: Every evening | ORAL | 0 refills | Status: DC | PRN

## 2017-11-06 NOTE — Telephone Encounter (Signed)
Copied from [place] [id number]. Topic: Quick Communication - Rx Refill/Question >> Nov 06, 2017  1:32 PM Christina Mckinney wrote: Has the patient contacted their pharmacy? Yes.     (Agent: If no, request that the patient contact the pharmacy for the refill.)   Preferred Pharmacy (with phone number or street name): pt needs short term rx called in for xanax called into [facility] [address]. This is until her long term rx from mail order comes in.  She has no pills left.  Cb# [phone]    Agent: Please be advised that RX refills may take up to 3 business days. We ask that you follow-up with your pharmacy.

## 2017-11-06 NOTE — Telephone Encounter (Signed)
Request for controlled substance 

## 2017-11-06 NOTE — Telephone Encounter (Signed)
PCP rx this med regularly thus will RX 90

## 2017-11-06 NOTE — Telephone Encounter (Signed)
Pt is requesting refill on alprazolam.   Last OV: 07/09/2017 Last Fill: 03/06/2017 #90 and 1RF UDS: None seen  Will you refill in PCP absence?

## 2017-11-08 ENCOUNTER — Other Ambulatory Visit: Payer: Self-pay | Admitting: Family Medicine

## 2017-11-08 DIAGNOSIS — R0602 Shortness of breath: Secondary | ICD-10-CM

## 2017-11-08 DIAGNOSIS — K2100 Gastro-esophageal reflux disease with esophagitis, without bleeding: Secondary | ICD-10-CM

## 2017-11-08 DIAGNOSIS — R0600 Dyspnea, unspecified: Secondary | ICD-10-CM

## 2017-11-08 DIAGNOSIS — R05 Cough: Secondary | ICD-10-CM | POA: Diagnosis not present

## 2017-11-08 DIAGNOSIS — Z23 Encounter for immunization: Secondary | ICD-10-CM

## 2017-11-08 DIAGNOSIS — R490 Dysphonia: Secondary | ICD-10-CM | POA: Diagnosis not present

## 2017-11-08 DIAGNOSIS — I10 Essential (primary) hypertension: Secondary | ICD-10-CM

## 2017-11-08 DIAGNOSIS — H8110 Benign paroxysmal vertigo, unspecified ear: Secondary | ICD-10-CM

## 2017-11-08 DIAGNOSIS — E119 Type 2 diabetes mellitus without complications: Secondary | ICD-10-CM

## 2017-11-08 DIAGNOSIS — J383 Other diseases of vocal cords: Secondary | ICD-10-CM | POA: Diagnosis not present

## 2017-11-08 DIAGNOSIS — K5900 Constipation, unspecified: Secondary | ICD-10-CM

## 2017-11-12 ENCOUNTER — Other Ambulatory Visit: Payer: Self-pay | Admitting: Family Medicine

## 2017-12-07 ENCOUNTER — Other Ambulatory Visit: Payer: Self-pay | Admitting: Family Medicine

## 2017-12-15 ENCOUNTER — Other Ambulatory Visit: Payer: Self-pay | Admitting: Family Medicine

## 2017-12-19 ENCOUNTER — Other Ambulatory Visit: Payer: Self-pay | Admitting: Family Medicine

## 2018-02-05 ENCOUNTER — Other Ambulatory Visit: Payer: Self-pay | Admitting: Family Medicine

## 2018-02-05 NOTE — Telephone Encounter (Signed)
Copied from [place] [id number]. Topic: Quick Communication - See Telephone Encounter >> Feb 05, 2018  4:14 PM Conception Chancy, NT wrote: CRM for notification. See Telephone encounter for: 02/05/18.  Patient states her pharmacy contacted her and  losartan (COZAAR) 50 MG tablet had been recalled that she had.   Also is needing a refill on ALPRAZolam Duanne Moron) 0.5 MG tablet   [facility] [place], [place]  [address] [place] [address]  Phone: [phone] Fax: [phone]

## 2018-02-05 NOTE — Telephone Encounter (Signed)
Pt stating pharmacy contacted her and was told that Losartan(Cozaar) 50mg  tablet has been recalled. Pt also requesting refill of xanax.   LOV: 07/09/17  Dr. Charlett Blake  [facility]

## 2018-02-11 NOTE — Telephone Encounter (Signed)
Requesting:xanax Contract:no UDS:np Last OV:07/09/17 Next OV:---- Last Refill:11/06/17 Database:no concerns   Please advise

## 2018-02-11 NOTE — Telephone Encounter (Signed)
It has been more than 6 months since seen and she needs a uds and contract, she can have #30 tabs to give her time to get an appt.

## 2018-02-13 NOTE — Telephone Encounter (Signed)
Left vmail for patient to call and schedule an appt

## 2018-02-13 NOTE — Telephone Encounter (Signed)
Called left vmail for patient to call the office back  Patients needs appt.

## 2018-02-17 ENCOUNTER — Other Ambulatory Visit: Payer: Self-pay | Admitting: Family Medicine

## 2018-02-24 NOTE — Telephone Encounter (Signed)
Thanks once we get patient back in we can refill for longer

## 2018-03-05 DIAGNOSIS — Z01419 Encounter for gynecological examination (general) (routine) without abnormal findings: Secondary | ICD-10-CM | POA: Diagnosis not present

## 2018-03-05 DIAGNOSIS — Z1231 Encounter for screening mammogram for malignant neoplasm of breast: Secondary | ICD-10-CM | POA: Diagnosis not present

## 2018-03-05 DIAGNOSIS — N3281 Overactive bladder: Secondary | ICD-10-CM | POA: Diagnosis not present

## 2018-03-17 ENCOUNTER — Ambulatory Visit (INDEPENDENT_AMBULATORY_CARE_PROVIDER_SITE_OTHER): Payer: Medicare Other | Admitting: Family Medicine

## 2018-03-17 VITALS — BP 138/64 | HR 64 | Temp 98.2°F | Resp 18 | Wt 158.0 lb

## 2018-03-17 DIAGNOSIS — R350 Frequency of micturition: Secondary | ICD-10-CM

## 2018-03-17 DIAGNOSIS — E039 Hypothyroidism, unspecified: Secondary | ICD-10-CM

## 2018-03-17 DIAGNOSIS — R195 Other fecal abnormalities: Secondary | ICD-10-CM

## 2018-03-17 DIAGNOSIS — E119 Type 2 diabetes mellitus without complications: Secondary | ICD-10-CM

## 2018-03-17 DIAGNOSIS — I10 Essential (primary) hypertension: Secondary | ICD-10-CM

## 2018-03-17 DIAGNOSIS — R739 Hyperglycemia, unspecified: Secondary | ICD-10-CM | POA: Diagnosis not present

## 2018-03-17 DIAGNOSIS — M109 Gout, unspecified: Secondary | ICD-10-CM | POA: Diagnosis not present

## 2018-03-17 DIAGNOSIS — E782 Mixed hyperlipidemia: Secondary | ICD-10-CM

## 2018-03-17 DIAGNOSIS — Z79899 Other long term (current) drug therapy: Secondary | ICD-10-CM | POA: Diagnosis not present

## 2018-03-17 MED ORDER — FEBUXOSTAT 40 MG PO TABS
40.0000 mg | ORAL_TABLET | Freq: Every day | ORAL | 1 refills | Status: DC
Start: 2018-03-17 — End: 2018-09-04

## 2018-03-17 MED ORDER — FEXOFENADINE HCL 180 MG PO TABS: 180.0000 mg | ORAL_TABLET | Freq: Every day | ORAL | 1 refills | Status: DC

## 2018-03-17 MED ORDER — ALPRAZOLAM 0.5 MG PO TABS: 0.5000 mg | ORAL_TABLET | Freq: Every evening | ORAL | 1 refills | Status: DC | PRN

## 2018-03-17 MED ORDER — LOSARTAN POTASSIUM 50 MG PO TABS: ORAL_TABLET | ORAL | 1 refills | Status: DC

## 2018-03-17 MED ORDER — PROPRANOLOL HCL ER 60 MG PO CP24: 60.0000 mg | ORAL_CAPSULE | Freq: Every day | ORAL | 1 refills | Status: DC

## 2018-03-17 NOTE — Assessment & Plan Note (Signed)
Encouraged heart healthy diet, increase exercise, avoid trans fats, consider a krill oil cap daily 

## 2018-03-17 NOTE — Assessment & Plan Note (Signed)
Check uric acid level 

## 2018-03-17 NOTE — Assessment & Plan Note (Signed)
hgba1c acceptable, minimize simple carbs. Increase exercise as tolerated. Continue current meds 

## 2018-03-17 NOTE — Assessment & Plan Note (Signed)
Well controlled, no changes to meds. Encouraged heart healthy diet such as the DASH diet and exercise as tolerated.  °

## 2018-03-17 NOTE — Assessment & Plan Note (Signed)
On Levothyroxine, continue to monitor 

## 2018-03-17 NOTE — Progress Notes (Signed)
Subjective:  I acted as a Education administrator for Dr. Charlett Blake. [place], [place]  Patient ID: Christina Mckinney, female    DOB: 20-Aug-1932, 84 y.o.   MRN: [id number]  No chief complaint on file.   HPI  Patient is in today for follow up visit and she continues to be under a great deal of stress at home with family members with serious health concerns. She is frustrated with thinning nails and hair. No recent febrile illness or hospitalizations. Denies CP/palp/SOB/HA/congestion/fevers/GI or GU c/o. Taking meds as prescribed  Patient Care Team: Mosie Lukes, MD as PCP - General (Family Medicine) Martinique, Peter M, MD as Consulting Physician (Cardiology) Arvella Nigh, MD as Consulting Physician (Obstetrics and Gynecology) Rutherford Guys, MD as Consulting Physician (Ophthalmology) Mickel Fuchs, MD as Consulting Physician (Neurology) Aviva Signs, MD as Consulting Physician (Gastroenterology) Franchot Mimes, Teodora Medici, MD as Consulting Physician (Pulmonary Disease)   Past Medical History:  Diagnosis Date  . Anemia, unspecified   . Anxiety and depression 09/18/2015  . Anxiety state, unspecified   . Chest pain   . Chronic systolic CHF (congestive heart failure) ([place]) 08/12/2015  . Depression with anxiety 06/02/2007   Qualifier: Diagnosis of  By: Wynona Luna   . Depressive disorder, not elsewhere classified   . Diverticulitis   . DVT (deep venous thrombosis) ([place])   . Dyspnea 07/22/2015  . Edema 06/12/2014  . Essential and other specified forms of tremor   . GERD (gastroesophageal reflux disease)   . Gout, unspecified   . Internal hemorrhoids without mention of complication   . Macular degeneration 08/07/2015  . Osteoarthritis   . Other abnormal glucose   . Other and unspecified hyperlipidemia   . Personal history of colonic polyps   . Personal history of peptic ulcer disease   . Pulmonary embolism ([place])   . Unspecified disorder of bladder   . Unspecified essential hypertension   . Unspecified  hypothyroidism   . Varicose veins     Past Surgical History:  Procedure Laterality Date  . ABDOMINAL HYSTERECTOMY    . CHOLECYSTECTOMY    . ENDOVENOUS ABLATION SAPHENOUS VEIN W/ LASER  09-11-2012   left greater saphenous vein   by Curt Jews MD  . EYE SURGERY    . KNEE ARTHROSCOPY     right   . NASAL SEPTUM SURGERY    . SHOULDER SURGERY    . TOTAL KNEE ARTHROPLASTY  09/2013   Right    Family History  Problem Relation Age of Onset  . Other Mother        varicose veins  . Hip fracture Mother   . Heart disease Father        CHF  . Hyperlipidemia Father   . Heart attack Father   . Diabetes Daughter   . Heart disease Daughter   . Hyperlipidemia Daughter   . Hypertension Daughter   . Aneurysm Daughter   . Cancer Sister        stomach  . Heart disease Sister   . Heart disease Brother   . Colon cancer Neg Hx     Social History   Socioeconomic History  . Marital status: Widowed    Spouse name: Not on file  . Number of children: 3  . Years of education: Not on file  . Highest education level: Not on file  Occupational History  . Occupation: Retired   Scientific laboratory technician  . Financial resource strain: Not on file  . Food insecurity:  Worry: Not on file    Inability: Not on file  . Transportation needs:    Medical: Not on file    Non-medical: Not on file  Tobacco Use  . Smoking status: Former Smoker    Types: Cigarettes    Last attempt to quit: 11/12/1988    Years since quitting: 29.3  . Smokeless tobacco: Never Used  Substance and Sexual Activity  . Alcohol use: Yes    Alcohol/week: 2.4 oz    Types: 4 Standard drinks or equivalent per week    Comment: occasional wine   . Drug use: No  . Sexual activity: Never    Comment: lives alone, no dietary restrictions, widowed  Lifestyle  . Physical activity:    Days per week: Not on file    Minutes per session: Not on file  . Stress: Not on file  Relationships  . Social connections:    Talks on phone: Not on file     Gets together: Not on file    Attends religious service: Not on file    Active member of club or organization: Not on file    Attends meetings of clubs or organizations: Not on file    Relationship status: Not on file  . Intimate partner violence:    Fear of current or ex partner: Not on file    Emotionally abused: Not on file    Physically abused: Not on file    Forced sexual activity: Not on file  Other Topics Concern  . Not on file  Social History Narrative  . Not on file    Outpatient Medications Prior to Visit  Medication Sig Dispense Refill  . aspirin 81 MG tablet Take 1 tablet (81 mg total) by mouth daily. 30 tablet   . azelastine (ASTELIN) 137 MCG/SPRAY nasal spray Place 1 spray into the nose daily as needed. Use in each nostril as directed    . Cholecalciferol (VITAMIN D) 2000 UNITS tablet Take 6,000 Units by mouth daily.     Marland Kitchen co-enzyme Q-10 30 MG capsule Take 30 mg by mouth daily.     . fenofibrate micronized (ANTARA) 130 MG capsule Take 1 capsule (130 mg total) by mouth daily before breakfast. Failed Statins, Simvastatin, Atorvastatin, Rosuvastatin 90 capsule 1  . fenofibrate micronized (LOFIBRA) 134 MG capsule TAKE 1 CAPSULE DAILY 90 capsule 50  . fluticasone (FLONASE) 50 MCG/ACT nasal spray USE 2 SPRAYS IN EACH NOSTRIL DAILY AS NEEDED FOR ALLERGIES OR RHINITIS 48 g 1  . fluticasone (FLOVENT HFA) 110 MCG/ACT inhaler Inhale 2 puffs into the lungs 2 (two) times daily. 3 Inhaler 3  . furosemide (LASIX) 40 MG tablet TAKE 1 TABLET DAILY AS DIRECTED 90 tablet 1  . gabapentin (NEURONTIN) 300 MG capsule TAKE 1 CAPSULE AT BEDTIME 90 capsule 1  . montelukast (SINGULAIR) 10 MG tablet TAKE 1 TABLET AT BEDTIME 90 tablet 1  . pantoprazole (PROTONIX) 40 MG tablet Take 1 tablet (40 mg total) by mouth 2 (two) times daily. 180 tablet 5  . PATADAY 0.2 % SOLN Place 1 drop into both eyes daily.    Marland Kitchen PROAIR HFA 108 (90 Base) MCG/ACT inhaler USE 2 INHALATIONS EVERY 6 HOURS AS NEEDED FOR WHEEZING  OR SHORTNESS OF BREATH 25.5 g 1  . sucralfate (CARAFATE) 1 g tablet Take 1 tablet (1 g total) by mouth 4 (four) times daily. 360 tablet 0  . SYNTHROID 50 MCG tablet TAKE 1 TABLET DAILY BEFORE BREAKFAST 90 tablet 1  . ALPRAZolam Duanne Moron)  0.5 MG tablet Take 1 tablet (0.5 mg total) by mouth at bedtime as needed for anxiety. 90 tablet 0  . losartan (COZAAR) 100 MG tablet Take 1 tablet (100 mg total) by mouth daily. 90 tablet 1  . losartan (COZAAR) 50 MG tablet TAKE 1 TABLET DAILY (SCHEDULE APPOINTMENT FOR OCTOBER) 90 tablet 0  . propranolol ER (INDERAL LA) 60 MG 24 hr capsule TAKE 1 CAPSULE DAILY 90 capsule 1  . ranitidine (ZANTAC) 150 MG capsule TAKE 1 CAPSULE DAILY 90 capsule 1  . ULORIC 40 MG tablet TAKE 1 TABLET DAILY 90 tablet 1   No facility-administered medications prior to visit.     Allergies  Allergen Reactions  . Simvastatin Other (See Comments)    Hip pain  . Crestor [Rosuvastatin]     Left hip pain  . Lipitor [Atorvastatin]     Pain in hips  . Red Dye     Hip pain    Review of Systems  Constitutional: Positive for malaise/fatigue. Negative for fever.  HENT: Negative for congestion.   Eyes: Negative for blurred vision.  Respiratory: Negative for shortness of breath.   Cardiovascular: Negative for chest pain, palpitations and leg swelling.  Gastrointestinal: Negative for abdominal pain, blood in stool and nausea.  Genitourinary: Negative for dysuria and frequency.  Musculoskeletal: Negative for falls.  Skin: Negative for rash.  Neurological: Negative for dizziness, loss of consciousness and headaches.  Endo/Heme/Allergies: Negative for environmental allergies.  Psychiatric/Behavioral: Negative for depression. The patient is nervous/anxious.        Objective:    Physical Exam  BP 138/64 (BP Location: Left Arm, Patient Position: Sitting, Cuff Size: Normal)   Pulse 64   Temp 98.2 F (36.8 C) (Oral)   Resp 18   Wt 158 lb (71.7 kg)   SpO2 97%   BMI 29.85 kg/m    Wt Readings from Last 3 Encounters:  03/17/18 158 lb (71.7 kg)  07/09/17 160 lb (72.6 kg)  04/22/17 160 lb (72.6 kg)   BP Readings from Last 3 Encounters:  03/17/18 138/64  07/09/17 126/60  07/08/17 113/63     Immunization History  Administered Date(s) Administered  . DTP 11/11/2001  . Influenza Whole 08/06/2007, 07/29/2008, 08/05/2009, 07/18/2010  . Influenza, High Dose Seasonal PF 07/22/2015, 08/30/2016, 07/09/2017  . Influenza,inj,Quad PF,6+ Mos 07/12/2014, 09/01/2014  . Pneumococcal Conjugate-13 11/12/2003, 09/21/2013  . Pneumococcal Polysaccharide-23 03/15/2003, 07/29/2008, 08/30/2016  . Td 03/31/2001, 04/30/2016  . Zoster 04/02/2008    Health Maintenance  Topic Date Due  . OPHTHALMOLOGY EXAM  01/15/1942  . HEMOGLOBIN A1C  10/22/2017  . FOOT EXAM  12/04/2017  . INFLUENZA VACCINE  06/12/2018  . TETANUS/TDAP  04/30/2026  . DEXA SCAN  Completed  . PNA vac Low Risk Adult  Completed    Lab Results  Component Value Date   WBC 8.4 04/22/2017   HGB 13.2 04/22/2017   HCT 39.9 04/22/2017   PLT 212.0 04/22/2017   GLUCOSE 96 04/22/2017   CHOL 194 04/22/2017   TRIG 349.0 (H) 04/22/2017   HDL 47.50 04/22/2017   LDLDIRECT 96.0 04/22/2017   LDLCALC 129 (H) 12/04/2016   ALT 7 04/22/2017   AST 22 04/22/2017   NA 141 04/22/2017   K 4.8 04/22/2017   CL 103 04/22/2017   CREATININE 1.37 (H) 04/22/2017   BUN 19 04/22/2017   CO2 33 (H) 04/22/2017   TSH 2.11 04/22/2017   INR 1.0 07/21/2010   HGBA1C 5.9 04/22/2017   MICROALBUR 0.9 01/31/2016  Lab Results  Component Value Date   TSH 2.11 04/22/2017   Lab Results  Component Value Date   WBC 8.4 04/22/2017   HGB 13.2 04/22/2017   HCT 39.9 04/22/2017   MCV 97.0 04/22/2017   PLT 212.0 04/22/2017   Lab Results  Component Value Date   NA 141 04/22/2017   K 4.8 04/22/2017   CO2 33 (H) 04/22/2017   GLUCOSE 96 04/22/2017   BUN 19 04/22/2017   CREATININE 1.37 (H) 04/22/2017   BILITOT 0.5 04/22/2017   ALKPHOS 65  04/22/2017   AST 22 04/22/2017   ALT 7 04/22/2017   PROT 7.3 04/22/2017   ALBUMIN 4.3 04/22/2017   CALCIUM 10.9 (H) 04/22/2017   ANIONGAP 11 04/20/2015   GFR 39.11 (L) 04/22/2017   Lab Results  Component Value Date   CHOL 194 04/22/2017   Lab Results  Component Value Date   HDL 47.50 04/22/2017   Lab Results  Component Value Date   LDLCALC 129 (H) 12/04/2016   Lab Results  Component Value Date   TRIG 349.0 (H) 04/22/2017   Lab Results  Component Value Date   CHOLHDL 4 04/22/2017   Lab Results  Component Value Date   HGBA1C 5.9 04/22/2017         Assessment & Plan:   Problem List Items Addressed This Visit    Hypothyroidism    On Levothyroxine, continue to monitor      Relevant Medications   propranolol ER (INDERAL LA) 60 MG 24 hr capsule   Other Relevant Orders   TSH   Hyperlipidemia, mixed    Encouraged heart healthy diet, increase exercise, avoid trans fats, consider a krill oil cap daily      Relevant Medications   losartan (COZAAR) 50 MG tablet   propranolol ER (INDERAL LA) 60 MG 24 hr capsule   Other Relevant Orders   Lipid panel   Gout    Check uric acid level      Relevant Orders   Uric acid   Essential hypertension - Primary    Well controlled, no changes to meds. Encouraged heart healthy diet such as the DASH diet and exercise as tolerated.       Relevant Medications   losartan (COZAAR) 50 MG tablet   propranolol ER (INDERAL LA) 60 MG 24 hr capsule   Other Relevant Orders   CBC   Comprehensive metabolic panel   Diabetes mellitus without complication (HCC)    [id number] acceptable, minimize simple carbs. Increase exercise as tolerated. Continue current meds      Relevant Medications   losartan (COZAAR) 50 MG tablet    Other Visit Diagnoses    Hyperglycemia       Relevant Orders   Hemoglobin A1c   High risk medication use       Relevant Orders   Pain Mgmt, Profile 8 w/Conf, U   Urine frequency       Relevant Orders   Urine  Culture   Urinalysis   Loose stools          I have discontinued Christina Mckinney's losartan and ranitidine. I have changed her ULORIC to febuxostat. I have also changed her losartan and propranolol ER. Additionally, I am having her start on fexofenadine. Lastly, I am having her maintain her azelastine, Vitamin D, co-enzyme Q-10, aspirin, PATADAY, fluticasone, fenofibrate micronized, PROAIR HFA, sucralfate, pantoprazole, furosemide, fenofibrate micronized, fluticasone, montelukast, gabapentin, SYNTHROID, and ALPRAZolam.  Meds ordered this encounter  Medications  . ALPRAZolam Duanne Moron)  0.5 MG tablet    Sig: Take 1 tablet (0.5 mg total) by mouth at bedtime as needed for anxiety.    Dispense:  90 tablet    Refill:  1  . losartan (COZAAR) 50 MG tablet    Sig: TAKE 1 TABLET DAILY    Dispense:  90 tablet    Refill:  1  . propranolol ER (INDERAL LA) 60 MG 24 hr capsule    Sig: Take 1 capsule (60 mg total) by mouth daily.    Dispense:  90 capsule    Refill:  1  . fexofenadine (ALLEGRA ALLERGY) 180 MG tablet    Sig: Take 1 tablet (180 mg total) by mouth daily.    Dispense:  90 tablet    Refill:  1  . febuxostat (ULORIC) 40 MG tablet    Sig: Take 1 tablet (40 mg total) by mouth daily.    Dispense:  90 tablet    Refill:  1    CMA served as Education administrator during this visit. History, Physical and Plan performed by medical provider. Documentation and orders reviewed and attested to.  [name], MD

## 2018-03-17 NOTE — Patient Instructions (Signed)
Zinc 50 mg daily, biotin and fish or krill oil Diabetes Mellitus and Exercise Exercising regularly is important for your overall health, especially when you have diabetes (diabetes mellitus). Exercising is not only about losing weight. It has many health benefits, such as increasing muscle strength and bone density and reducing body fat and stress. This leads to improved fitness, flexibility, and endurance, all of which result in better overall health. Exercise has additional benefits for people with diabetes, including:  Reducing appetite.  Helping to lower and control blood glucose.  Lowering blood pressure.  Helping to control amounts of fatty substances (lipids) in the blood, such as cholesterol and triglycerides.  Helping the body to respond better to insulin (improving insulin sensitivity).  Reducing how much insulin the body needs.  Decreasing the risk for heart disease by: ? Lowering cholesterol and triglyceride levels. ? Increasing the levels of good cholesterol. ? Lowering blood glucose levels.  What is my activity plan? Your health care provider or certified diabetes educator can help you make a plan for the type and frequency of exercise (activity plan) that works for you. Make sure that you:  Do at least 150 minutes of moderate-intensity or vigorous-intensity exercise each week. This could be brisk walking, biking, or water aerobics. ? Do stretching and strength exercises, such as yoga or weightlifting, at least 2 times a week. ? Spread out your activity over at least 3 days of the week.  Get some form of physical activity every day. ? Do not go more than 2 days in a row without some kind of physical activity. ? Avoid being inactive for more than 90 minutes at a time. Take frequent breaks to walk or stretch.  Choose a type of exercise or activity that you enjoy, and set realistic goals.  Start slowly, and gradually increase the intensity of your exercise over  time.  What do I need to know about managing my diabetes?  Check your blood glucose before and after exercising. ? If your blood glucose is higher than 240 mg/dL (13.3 mmol/L) before you exercise, check your urine for ketones. If you have ketones in your urine, do not exercise until your blood glucose returns to normal.  Know the symptoms of low blood glucose (hypoglycemia) and how to treat it. Your risk for hypoglycemia increases during and after exercise. Common symptoms of hypoglycemia can include: ? Hunger. ? Anxiety. ? Sweating and feeling clammy. ? Confusion. ? Dizziness or feeling light-headed. ? Increased heart rate or palpitations. ? Blurry vision. ? Tingling or numbness around the mouth, lips, or tongue. ? Tremors or shakes. ? Irritability.  Keep a rapid-acting carbohydrate snack available before, during, and after exercise to help prevent or treat hypoglycemia.  Avoid injecting insulin into areas of the body that are going to be exercised. For example, avoid injecting insulin into: ? The arms, when playing tennis. ? The legs, when jogging.  Keep records of your exercise habits. Doing this can help you and your health care provider adjust your diabetes management plan as needed. Write down: ? Food that you eat before and after you exercise. ? Blood glucose levels before and after you exercise. ? The type and amount of exercise you have done. ? When your insulin is expected to peak, if you use insulin. Avoid exercising at times when your insulin is peaking.  When you start a new exercise or activity, work with your health care provider to make sure the activity is safe for you, and to  adjust your insulin, medicines, or food intake as needed.  Drink plenty of water while you exercise to prevent dehydration or heat stroke. Drink enough fluid to keep your urine clear or pale yellow. This information is not intended to replace advice given to you by your health care provider.  Make sure you discuss any questions you have with your health care provider. Document Released: 01/19/2004 Document Revised: 05/18/2016 Document Reviewed: 04/09/2016 Elsevier Interactive Patient Education  2018 [employer].

## 2018-03-18 LAB — LIPID PANEL
Cholesterol: 183 mg/dL (ref 0–200)
HDL: 58.6 mg/dL (ref >39.00)
LDL Cholesterol: 109 mg/dL — ABNORMAL HIGH (ref 0–99)
NonHDL: 124.44
TRIGLYCERIDES: 76 mg/dL (ref 0.0–149.0)
Total CHOL/HDL Ratio: 3
VLDL: 15.2 mg/dL (ref 0.0–40.0)

## 2018-03-18 LAB — HEMOGLOBIN A1C: HEMOGLOBIN A1C: 5.5 % (ref 4.6–6.5)

## 2018-03-18 LAB — URINALYSIS
BILIRUBIN URINE: NEGATIVE
KETONES UR: NEGATIVE
Leukocytes, UA: NEGATIVE
Nitrite: NEGATIVE
PH: 5.5 (ref 5.0–8.0)
Specific Gravity, Urine: <=1.005 — AB (ref 1.000–1.030)
TOTAL PROTEIN, URINE-UPE24: NEGATIVE
Urine Glucose: NEGATIVE
Urobilinogen, UA: 0.2 (ref 0.0–1.0)

## 2018-03-18 LAB — CBC
HEMATOCRIT: 37.4 % (ref 36.0–46.0)
Hemoglobin: 12.6 g/dL (ref 12.0–15.0)
MCHC: 33.7 g/dL (ref 30.0–36.0)
MCV: 97.3 fl (ref 78.0–100.0)
PLATELETS: 208 10*3/uL (ref 150.0–400.0)
RBC: 3.85 Mil/uL — ABNORMAL LOW (ref 3.87–5.11)
RDW: 13.8 % (ref 11.5–15.5)
WBC: 9.1 10*3/uL (ref 4.0–10.5)

## 2018-03-18 LAB — COMPREHENSIVE METABOLIC PANEL
ALK PHOS: 66 U/L (ref 39–117)
ALT: 5 U/L (ref 0–35)
AST: 19 U/L (ref 0–37)
Albumin: 4.3 g/dL (ref 3.5–5.2)
BILIRUBIN TOTAL: 0.7 mg/dL (ref 0.2–1.2)
BUN: 18 mg/dL (ref 6–23)
CALCIUM: 10.5 mg/dL (ref 8.4–10.5)
CO2: 28 meq/L (ref 19–32)
Chloride: 106 mEq/L (ref 96–112)
Creatinine, Ser: 1.2 mg/dL (ref 0.40–1.20)
GFR: 45.47 mL/min — ABNORMAL LOW (ref >60.00)
GLUCOSE: 89 mg/dL (ref 70–99)
POTASSIUM: 5.1 meq/L (ref 3.5–5.1)
Sodium: 142 mEq/L (ref 135–145)
TOTAL PROTEIN: 7.3 g/dL (ref 6.0–8.3)

## 2018-03-18 LAB — URIC ACID: Uric Acid, Serum: 5.9 mg/dL (ref 2.4–7.0)

## 2018-03-18 LAB — TSH: TSH: 1.47 u[IU]/mL (ref 0.35–4.50)

## 2018-03-20 LAB — PAIN MGMT, PROFILE 8 W/CONF, U
6 ACETYLMORPHINE: NEGATIVE ng/mL (ref <10)
ALPHAHYDROXYALPRAZOLAM: NEGATIVE ng/mL (ref <25)
ALPHAHYDROXYMIDAZOLAM: NEGATIVE ng/mL (ref <50)
ALPHAHYDROXYTRIAZOLAM: NEGATIVE ng/mL (ref <50)
AMINOCLONAZEPAM: NEGATIVE ng/mL (ref <25)
AMPHETAMINES: NEGATIVE ng/mL (ref <500)
Alcohol Metabolites: NEGATIVE ng/mL (ref <500)
Benzodiazepines: NEGATIVE ng/mL (ref <100)
Buprenorphine, Urine: NEGATIVE ng/mL (ref <5)
Cocaine Metabolite: NEGATIVE ng/mL (ref <150)
Creatinine: 39.6 mg/dL
HYDROXYETHYLFLURAZEPAM: NEGATIVE ng/mL (ref <50)
Lorazepam: NEGATIVE ng/mL (ref <50)
MDA: NEGATIVE ng/mL (ref <200)
MDMA: NEGATIVE ng/mL (ref <500)
MDMA: NEGATIVE ng/mL (ref <200)
Marijuana Metabolite: NEGATIVE ng/mL (ref <20)
Nordiazepam: NEGATIVE ng/mL (ref <50)
OPIATES: NEGATIVE ng/mL (ref <100)
OXAZEPAM: NEGATIVE ng/mL (ref <50)
OXIDANT: NEGATIVE ug/mL (ref <200)
Oxycodone: NEGATIVE ng/mL (ref <100)
Temazepam: NEGATIVE ng/mL (ref <50)
pH: 6.72 (ref 4.5–9.0)

## 2018-03-20 LAB — URINE CULTURE
MICRO NUMBER: 90549295
RESULT: NO GROWTH
SPECIMEN QUALITY: ADEQUATE

## 2018-04-03 NOTE — Progress Notes (Deleted)
Subjective:   Christina Mckinney is a 84 y.o. female who presents for Medicare Annual (Subsequent) preventive examination.  Review of Systems: No ROS.  Medicare Wellness Visit. Additional risk factors are reflected in the social history.   Sleep patterns:  Home Safety/Smoke Alarms: Feels safe in home. Smoke alarms in place.  Living environment; residence and Firearm Safety:   Female:      Mammo-       Dexa scan-           Objective:     Vitals: There were no vitals taken for this visit.  There is no height or weight on file to calculate BMI.  Advanced Directives 05/07/2017 04/09/2017 04/19/2015 10/21/2014  Does Patient Have a Medical Advance Directive? No No Yes No  Type of Advance Directive - - [place] -  Does patient want to make changes to medical advance directive? - Yes (MAU/Ambulatory/Procedural Areas - Information given) - -  Would patient like information on creating a medical advance directive? - - - Yes - Educational materials given    Tobacco Social History   Tobacco Use  Smoking Status Former Smoker  . Types: Cigarettes  . Last attempt to quit: 11/12/1988  . Years since quitting: 29.4  Smokeless Tobacco Never Used     Counseling given: Not Answered   Clinical Intake:                       Past Medical History:  Diagnosis Date  . Anemia, unspecified   . Anxiety and depression 09/18/2015  . Anxiety state, unspecified   . Chest pain   . Chronic systolic CHF (congestive heart failure) ([place]) 08/12/2015  . Depression with anxiety 06/02/2007   Qualifier: Diagnosis of  By: Wynona Luna   . Depressive disorder, not elsewhere classified   . Diverticulitis   . DVT (deep venous thrombosis) ([place])   . Dyspnea 07/22/2015  . Edema 06/12/2014  . Essential and other specified forms of tremor   . GERD (gastroesophageal reflux disease)   . Gout, unspecified   . Internal hemorrhoids without mention of complication   . Macular degeneration  08/07/2015  . Osteoarthritis   . Other abnormal glucose   . Other and unspecified hyperlipidemia   . Personal history of colonic polyps   . Personal history of peptic ulcer disease   . Pulmonary embolism ([place])   . Unspecified disorder of bladder   . Unspecified essential hypertension   . Unspecified hypothyroidism   . Varicose veins    Past Surgical History:  Procedure Laterality Date  . ABDOMINAL HYSTERECTOMY    . CHOLECYSTECTOMY    . ENDOVENOUS ABLATION SAPHENOUS VEIN W/ LASER  09-11-2012   left greater saphenous vein   by Curt Jews MD  . EYE SURGERY    . KNEE ARTHROSCOPY     right   . NASAL SEPTUM SURGERY    . SHOULDER SURGERY    . TOTAL KNEE ARTHROPLASTY  09/2013   Right   Family History  Problem Relation Age of Onset  . Other Mother        varicose veins  . Hip fracture Mother   . Heart disease Father        CHF  . Hyperlipidemia Father   . Heart attack Father   . Diabetes Daughter   . Heart disease Daughter   . Hyperlipidemia Daughter   . Hypertension Daughter   . Aneurysm Daughter   .  Cancer Sister        stomach  . Heart disease Sister   . Heart disease Brother   . Colon cancer Neg Hx    Social History   Socioeconomic History  . Marital status: Widowed    Spouse name: Not on file  . Number of children: 3  . Years of education: Not on file  . Highest education level: Not on file  Occupational History  . Occupation: Retired   Scientific laboratory technician  . Financial resource strain: Not on file  . Food insecurity:    Worry: Not on file    Inability: Not on file  . Transportation needs:    Medical: Not on file    Non-medical: Not on file  Tobacco Use  . Smoking status: Former Smoker    Types: Cigarettes    Last attempt to quit: 11/12/1988    Years since quitting: 29.4  . Smokeless tobacco: Never Used  Substance and Sexual Activity  . Alcohol use: Yes    Alcohol/week: 2.4 oz    Types: 4 Standard drinks or equivalent per week    Comment: occasional wine     . Drug use: No  . Sexual activity: Never    Comment: lives alone, no dietary restrictions, widowed  Lifestyle  . Physical activity:    Days per week: Not on file    Minutes per session: Not on file  . Stress: Not on file  Relationships  . Social connections:    Talks on phone: Not on file    Gets together: Not on file    Attends religious service: Not on file    Active member of club or organization: Not on file    Attends meetings of clubs or organizations: Not on file    Relationship status: Not on file  Other Topics Concern  . Not on file  Social History Narrative  . Not on file    Outpatient Encounter Medications as of 04/10/2018  Medication Sig  . ALPRAZolam (XANAX) 0.5 MG tablet Take 1 tablet (0.5 mg total) by mouth at bedtime as needed for anxiety.  Marland Kitchen aspirin 81 MG tablet Take 1 tablet (81 mg total) by mouth daily.  Marland Kitchen azelastine (ASTELIN) 137 MCG/SPRAY nasal spray Place 1 spray into the nose daily as needed. Use in each nostril as directed  . Cholecalciferol (VITAMIN D) 2000 UNITS tablet Take 6,000 Units by mouth daily.   Marland Kitchen co-enzyme Q-10 30 MG capsule Take 30 mg by mouth daily.   . febuxostat (ULORIC) 40 MG tablet Take 1 tablet (40 mg total) by mouth daily.  . fenofibrate micronized (ANTARA) 130 MG capsule Take 1 capsule (130 mg total) by mouth daily before breakfast. Failed Statins, Simvastatin, Atorvastatin, Rosuvastatin  . fenofibrate micronized (LOFIBRA) 134 MG capsule TAKE 1 CAPSULE DAILY  . fexofenadine (ALLEGRA ALLERGY) 180 MG tablet Take 1 tablet (180 mg total) by mouth daily.  . fluticasone (FLONASE) 50 MCG/ACT nasal spray USE 2 SPRAYS IN EACH NOSTRIL DAILY AS NEEDED FOR ALLERGIES OR RHINITIS  . fluticasone (FLOVENT HFA) 110 MCG/ACT inhaler Inhale 2 puffs into the lungs 2 (two) times daily.  . furosemide (LASIX) 40 MG tablet TAKE 1 TABLET DAILY AS DIRECTED  . gabapentin (NEURONTIN) 300 MG capsule TAKE 1 CAPSULE AT BEDTIME  . losartan (COZAAR) 50 MG tablet TAKE 1  TABLET DAILY  . montelukast (SINGULAIR) 10 MG tablet TAKE 1 TABLET AT BEDTIME  . pantoprazole (PROTONIX) 40 MG tablet Take 1 tablet (40 mg total)  by mouth 2 (two) times daily.  Marland Kitchen PATADAY 0.2 % SOLN Place 1 drop into both eyes daily.  Marland Kitchen PROAIR HFA 108 (90 Base) MCG/ACT inhaler USE 2 INHALATIONS EVERY 6 HOURS AS NEEDED FOR WHEEZING OR SHORTNESS OF BREATH  . propranolol ER (INDERAL LA) 60 MG 24 hr capsule Take 1 capsule (60 mg total) by mouth daily.  . sucralfate (CARAFATE) 1 g tablet Take 1 tablet (1 g total) by mouth 4 (four) times daily.  Marland Kitchen SYNTHROID 50 MCG tablet TAKE 1 TABLET DAILY BEFORE BREAKFAST   No facility-administered encounter medications on file as of 04/10/2018.     Activities of Daily Living In your present state of health, do you have any difficulty performing the following activities: 04/09/2017  Hearing? N  Vision? N  Difficulty concentrating or making decisions? N  Walking or climbing stairs? N  Dressing or bathing? N  Doing errands, shopping? N  Preparing Food and eating ? N  Using the Toilet? N  In the past six months, have you accidently leaked urine? Y  Comment Urge incontinence  Do you have problems with loss of bowel control? Y  Managing your Medications? N  Managing your Finances? N  Some recent data might be hidden    Patient Care Team: Mosie Lukes, MD as PCP - General (Family Medicine) Martinique, Peter M, MD as Consulting Physician (Cardiology) Arvella Nigh, MD as Consulting Physician (Obstetrics and Gynecology) Rutherford Guys, MD as Consulting Physician (Ophthalmology) Mickel Fuchs, MD as Consulting Physician (Neurology) Aviva Signs, MD as Consulting Physician (Gastroenterology) Franchot Mimes, Teodora Medici, MD as Consulting Physician (Pulmonary Disease)    Assessment:   This is a routine wellness examination for Larrisha. Physical assessment deferred to PCP.  Exercise Activities and Dietary recommendations   Diet (meal preparation, eat out, water intake,  caffeinated beverages, dairy products, fruits and vegetables): {Desc; diets:16563} Breakfast: Lunch:  Dinner:      Goals    None      Fall Risk Fall Risk  04/09/2017 01/27/2016 11/03/2014 08/25/2013  Falls in the past year? No No No Yes  Number falls in past yr: - - - 2 or more  Risk for fall due to : - - - Impaired balance/gait    Depression Screen PHQ 2/9 Scores 04/09/2017 08/30/2016 01/27/2016 11/03/2014  PHQ - 2 Score 1 0 0 5  PHQ- 9 Score - - - 14     Cognitive Function MMSE - Mini Mental State Exam 04/09/2017  Orientation to time 5  Orientation to Place 5  Registration 3  Attention/ Calculation 5  Recall 2  Language- name 2 objects 2  Language- repeat 1  Language- follow 3 step command 3  Language- read & follow direction 1  Write a sentence 1  Copy design 1  Total score 29        Immunization History  Administered Date(s) Administered  . DTP 11/11/2001  . Influenza Whole 08/06/2007, 07/29/2008, 08/05/2009, 07/18/2010  . Influenza, High Dose Seasonal PF 07/22/2015, 08/30/2016, 07/09/2017  . Influenza,inj,Quad PF,6+ Mos 07/12/2014, 09/01/2014  . Pneumococcal Conjugate-13 11/12/2003, 09/21/2013  . Pneumococcal Polysaccharide-23 03/15/2003, 07/29/2008, 08/30/2016  . Td 03/31/2001, 04/30/2016  . Zoster 04/02/2008    Screening Tests Health Maintenance  Topic Date Due  . OPHTHALMOLOGY EXAM  01/15/1942  . FOOT EXAM  12/04/2017  . INFLUENZA VACCINE  06/12/2018  . HEMOGLOBIN A1C  09/17/2018  . TETANUS/TDAP  04/30/2026  . DEXA SCAN  Completed  . PNA vac Low Risk Adult  Completed  Plan:   ***   I have personally reviewed and noted the following in the patient's chart:   . Medical and social history . Use of alcohol, tobacco or illicit drugs  . Current medications and supplements . Functional ability and status . Nutritional status . Physical activity . Advanced directives . List of other physicians . Hospitalizations, surgeries, and ER visits in  previous 12 months . Vitals . Screenings to include cognitive, depression, and falls . Referrals and appointments  In addition, I have reviewed and discussed with patient certain preventive protocols, quality metrics, and best practice recommendations. A written personalized care plan for preventive services as well as general preventive health recommendations were provided to patient.     Naaman Plummer [place], [place]  04/03/2018

## 2018-04-10 ENCOUNTER — Ambulatory Visit: Payer: Medicare Other | Admitting: *Deleted

## 2018-04-29 DIAGNOSIS — K219 Gastro-esophageal reflux disease without esophagitis: Secondary | ICD-10-CM | POA: Diagnosis not present

## 2018-04-29 DIAGNOSIS — R490 Dysphonia: Secondary | ICD-10-CM | POA: Diagnosis not present

## 2018-04-29 DIAGNOSIS — R05 Cough: Secondary | ICD-10-CM | POA: Diagnosis not present

## 2018-06-03 DIAGNOSIS — M1712 Unilateral primary osteoarthritis, left knee: Secondary | ICD-10-CM | POA: Diagnosis not present

## 2018-06-03 DIAGNOSIS — M25562 Pain in left knee: Secondary | ICD-10-CM | POA: Diagnosis not present

## 2018-06-03 DIAGNOSIS — Z888 Allergy status to other drugs, medicaments and biological substances status: Secondary | ICD-10-CM | POA: Diagnosis not present

## 2018-06-03 DIAGNOSIS — Z91041 Radiographic dye allergy status: Secondary | ICD-10-CM | POA: Diagnosis not present

## 2018-06-17 DIAGNOSIS — M1712 Unilateral primary osteoarthritis, left knee: Secondary | ICD-10-CM | POA: Diagnosis not present

## 2018-06-24 DIAGNOSIS — M1712 Unilateral primary osteoarthritis, left knee: Secondary | ICD-10-CM | POA: Diagnosis not present

## 2018-07-01 ENCOUNTER — Other Ambulatory Visit: Payer: Self-pay | Admitting: Family Medicine

## 2018-07-01 DIAGNOSIS — M1712 Unilateral primary osteoarthritis, left knee: Secondary | ICD-10-CM | POA: Diagnosis not present

## 2018-08-14 DIAGNOSIS — Z961 Presence of intraocular lens: Secondary | ICD-10-CM | POA: Diagnosis not present

## 2018-08-29 ENCOUNTER — Other Ambulatory Visit: Payer: Self-pay | Admitting: Family Medicine

## 2018-09-04 ENCOUNTER — Ambulatory Visit (INDEPENDENT_AMBULATORY_CARE_PROVIDER_SITE_OTHER): Payer: Medicare Other | Admitting: Family Medicine

## 2018-09-04 ENCOUNTER — Encounter: Payer: Self-pay | Admitting: Family Medicine

## 2018-09-04 VITALS — BP 122/60 | HR 73 | Temp 98.3°F | Resp 18 | Wt 158.0 lb

## 2018-09-04 DIAGNOSIS — Z79899 Other long term (current) drug therapy: Secondary | ICD-10-CM | POA: Diagnosis not present

## 2018-09-04 DIAGNOSIS — Z23 Encounter for immunization: Secondary | ICD-10-CM

## 2018-09-04 DIAGNOSIS — M6740 Ganglion, unspecified site: Secondary | ICD-10-CM

## 2018-09-04 DIAGNOSIS — K219 Gastro-esophageal reflux disease without esophagitis: Secondary | ICD-10-CM | POA: Diagnosis not present

## 2018-09-04 DIAGNOSIS — E785 Hyperlipidemia, unspecified: Secondary | ICD-10-CM

## 2018-09-04 DIAGNOSIS — E782 Mixed hyperlipidemia: Secondary | ICD-10-CM

## 2018-09-04 DIAGNOSIS — E039 Hypothyroidism, unspecified: Secondary | ICD-10-CM

## 2018-09-04 DIAGNOSIS — I10 Essential (primary) hypertension: Secondary | ICD-10-CM

## 2018-09-04 DIAGNOSIS — R490 Dysphonia: Secondary | ICD-10-CM

## 2018-09-04 DIAGNOSIS — M109 Gout, unspecified: Secondary | ICD-10-CM | POA: Diagnosis not present

## 2018-09-04 DIAGNOSIS — E559 Vitamin D deficiency, unspecified: Secondary | ICD-10-CM

## 2018-09-04 DIAGNOSIS — G4730 Sleep apnea, unspecified: Secondary | ICD-10-CM | POA: Diagnosis not present

## 2018-09-04 DIAGNOSIS — R32 Unspecified urinary incontinence: Secondary | ICD-10-CM | POA: Diagnosis not present

## 2018-09-04 DIAGNOSIS — R42 Dizziness and giddiness: Secondary | ICD-10-CM

## 2018-09-04 MED ORDER — ALPRAZOLAM 0.5 MG PO TABS: 0.5000 mg | ORAL_TABLET | Freq: Every evening | ORAL | 1 refills | Status: DC | PRN

## 2018-09-04 MED ORDER — ULORIC 40 MG PO TABS: 40.0000 mg | ORAL_TABLET | Freq: Every day | ORAL | 1 refills | Status: DC

## 2018-09-04 MED ORDER — PANTOPRAZOLE SODIUM 40 MG PO TBEC: 40.0000 mg | DELAYED_RELEASE_TABLET | Freq: Two times a day (BID) | ORAL | 5 refills | Status: DC

## 2018-09-04 MED ORDER — FLUTICASONE PROPIONATE HFA 110 MCG/ACT IN AERO: 2.0000 | INHALATION_SPRAY | Freq: Two times a day (BID) | RESPIRATORY_TRACT | 3 refills | Status: DC

## 2018-09-04 MED ORDER — GABAPENTIN 300 MG PO CAPS: 300.0000 mg | ORAL_CAPSULE | Freq: Every day | ORAL | 1 refills | Status: DC

## 2018-09-04 MED ORDER — FAMOTIDINE 40 MG PO TABS: 40.0000 mg | ORAL_TABLET | Freq: Every day | ORAL | 1 refills | Status: DC

## 2018-09-04 MED ORDER — FEXOFENADINE HCL 180 MG PO TABS: 180.0000 mg | ORAL_TABLET | Freq: Every day | ORAL | 1 refills | Status: DC

## 2018-09-04 MED ORDER — PROPRANOLOL HCL ER 60 MG PO CP24: 60.0000 mg | ORAL_CAPSULE | Freq: Every day | ORAL | 1 refills | Status: DC

## 2018-09-04 NOTE — Assessment & Plan Note (Signed)
Is not using

## 2018-09-05 LAB — COMPREHENSIVE METABOLIC PANEL
ALT: 7 U/L (ref 0–35)
AST: 22 U/L (ref 0–37)
Albumin: 4.7 g/dL (ref 3.5–5.2)
Alkaline Phosphatase: 81 U/L (ref 39–117)
BILIRUBIN TOTAL: 0.7 mg/dL (ref 0.2–1.2)
BUN: 25 mg/dL — ABNORMAL HIGH (ref 6–23)
CHLORIDE: 100 meq/L (ref 96–112)
CO2: 29 meq/L (ref 19–32)
Calcium: 10.9 mg/dL — ABNORMAL HIGH (ref 8.4–10.5)
Creatinine, Ser: 1.4 mg/dL — ABNORMAL HIGH (ref 0.40–1.20)
GFR: 38.02 mL/min — AB (ref >60.00)
GLUCOSE: 109 mg/dL — AB (ref 70–99)
Potassium: 4.7 mEq/L (ref 3.5–5.1)
Sodium: 139 mEq/L (ref 135–145)
Total Protein: 7.5 g/dL (ref 6.0–8.3)

## 2018-09-05 LAB — CBC
HCT: 40.2 % (ref 36.0–46.0)
HEMOGLOBIN: 13.4 g/dL (ref 12.0–15.0)
MCHC: 33.3 g/dL (ref 30.0–36.0)
MCV: 97 fl (ref 78.0–100.0)
Platelets: 229 10*3/uL (ref 150.0–400.0)
RBC: 4.15 Mil/uL (ref 3.87–5.11)
RDW: 13.4 % (ref 11.5–15.5)
WBC: 9.5 10*3/uL (ref 4.0–10.5)

## 2018-09-05 LAB — URINE CULTURE
MICRO NUMBER: 91280913
SPECIMEN QUALITY: ADEQUATE

## 2018-09-05 LAB — LIPID PANEL
CHOL/HDL RATIO: 4
Cholesterol: 221 mg/dL — ABNORMAL HIGH (ref 0–200)
HDL: 57.8 mg/dL (ref >39.00)
LDL CALC: 135 mg/dL — AB (ref 0–99)
NONHDL: 163.57
Triglycerides: 143 mg/dL (ref 0.0–149.0)
VLDL: 28.6 mg/dL (ref 0.0–40.0)

## 2018-09-05 LAB — URINALYSIS, ROUTINE W REFLEX MICROSCOPIC
Bilirubin Urine: NEGATIVE
Hgb urine dipstick: NEGATIVE
Ketones, ur: NEGATIVE
Nitrite: NEGATIVE
PH: 6.5 (ref 5.0–8.0)
RBC / HPF: NONE SEEN (ref 0–?)
TOTAL PROTEIN, URINE-UPE24: NEGATIVE
Urine Glucose: NEGATIVE
Urobilinogen, UA: 0.2 (ref 0.0–1.0)

## 2018-09-05 LAB — VITAMIN D 25 HYDROXY (VIT D DEFICIENCY, FRACTURES): VITD: >120 ng/mL (ref 30.00–100.00)

## 2018-09-05 LAB — TSH: TSH: 3.62 u[IU]/mL (ref 0.35–4.50)

## 2018-09-07 DIAGNOSIS — M6740 Ganglion, unspecified site: Secondary | ICD-10-CM | POA: Insufficient documentation

## 2018-09-07 DIAGNOSIS — R490 Dysphonia: Secondary | ICD-10-CM | POA: Insufficient documentation

## 2018-09-07 DIAGNOSIS — R42 Dizziness and giddiness: Secondary | ICD-10-CM | POA: Insufficient documentation

## 2018-09-07 LAB — PAIN MGMT, PROFILE 8 W/CONF, U
6 Acetylmorphine: NEGATIVE ng/mL (ref <10)
ALCOHOL METABOLITES: NEGATIVE ng/mL (ref <500)
ALPHAHYDROXYALPRAZOLAM: 34 ng/mL — AB (ref <25)
ALPHAHYDROXYTRIAZOLAM: NEGATIVE ng/mL (ref <50)
AMPHETAMINES: NEGATIVE ng/mL (ref <500)
Alphahydroxymidazolam: NEGATIVE ng/mL (ref <50)
Aminoclonazepam: NEGATIVE ng/mL (ref <25)
Benzodiazepines: POSITIVE ng/mL — AB (ref <100)
Buprenorphine, Urine: NEGATIVE ng/mL (ref <5)
COCAINE METABOLITE: NEGATIVE ng/mL (ref <150)
Creatinine: 19.7 mg/dL — ABNORMAL LOW
HYDROXYETHYLFLURAZEPAM: NEGATIVE ng/mL (ref <50)
Lorazepam: NEGATIVE ng/mL (ref <50)
MDMA: NEGATIVE ng/mL (ref <500)
Marijuana Metabolite: NEGATIVE ng/mL (ref <20)
NORDIAZEPAM: NEGATIVE ng/mL (ref <50)
OXIDANT: NEGATIVE ug/mL (ref <200)
OXYCODONE: NEGATIVE ng/mL (ref <100)
Opiates: NEGATIVE ng/mL (ref <100)
Oxazepam: NEGATIVE ng/mL (ref <50)
PH: 6.89 (ref 4.5–9.0)
SPECIFIC GRAVITY: 1.006 (ref >=1.0)
Temazepam: NEGATIVE ng/mL (ref <50)

## 2018-09-07 NOTE — Assessment & Plan Note (Signed)
Avoid offending foods, start probiotics. Do not eat large meals in late evening and consider raising head of bed.  

## 2018-09-07 NOTE — Progress Notes (Signed)
Subjective:    Patient ID: Christina Mckinney, female    DOB: 11-08-1932, 84 y.o.   MRN: [id number]  No chief complaint on file.   HPI Patient is in today for follow-up.  She has been struggling with some increase in her heartburn recently denies any dietary changes.  Also notes persistent fatigue.  No febrile illness but she does continue to struggle with some congestion and hoarseness as well also noting some increased pain in her left wrist but denies any injury or fall has some swelling. Denies CP/palp/SOB/HA/fevers or GU c/o. Taking meds as prescribed  Past Medical History:  Diagnosis Date  . Anemia, unspecified   . Anxiety and depression 09/18/2015  . Anxiety state, unspecified   . Chest pain   . Chronic systolic CHF (congestive heart failure) ([place]) 08/12/2015  . Depression with anxiety 06/02/2007   Qualifier: Diagnosis of  By: Wynona Luna   . Depressive disorder, not elsewhere classified   . Diverticulitis   . DVT (deep venous thrombosis) ([place])   . Dyspnea 07/22/2015  . Edema 06/12/2014  . Essential and other specified forms of tremor   . GERD (gastroesophageal reflux disease)   . Gout, unspecified   . Internal hemorrhoids without mention of complication   . Macular degeneration 08/07/2015  . Osteoarthritis   . Other abnormal glucose   . Other and unspecified hyperlipidemia   . Personal history of colonic polyps   . Personal history of peptic ulcer disease   . Pulmonary embolism ([place])   . Unspecified disorder of bladder   . Unspecified essential hypertension   . Unspecified hypothyroidism   . Varicose veins     Past Surgical History:  Procedure Laterality Date  . ABDOMINAL HYSTERECTOMY    . CHOLECYSTECTOMY    . ENDOVENOUS ABLATION SAPHENOUS VEIN W/ LASER  09-11-2012   left greater saphenous vein   by Curt Jews MD  . EYE SURGERY    . KNEE ARTHROSCOPY     right   . NASAL SEPTUM SURGERY    . SHOULDER SURGERY    . TOTAL KNEE ARTHROPLASTY  09/2013   Right     Family History  Problem Relation Age of Onset  . Other Mother        varicose veins  . Hip fracture Mother   . Heart disease Father        CHF  . Hyperlipidemia Father   . Heart attack Father   . Diabetes Daughter   . Heart disease Daughter   . Hyperlipidemia Daughter   . Hypertension Daughter   . Aneurysm Daughter   . Cancer Sister        stomach  . Heart disease Sister   . Heart disease Brother   . Colon cancer Neg Hx     Social History   Socioeconomic History  . Marital status: Widowed    Spouse name: Not on file  . Number of children: 3  . Years of education: Not on file  . Highest education level: Not on file  Occupational History  . Occupation: Retired   Scientific laboratory technician  . Financial resource strain: Not on file  . Food insecurity:    Worry: Not on file    Inability: Not on file  . Transportation needs:    Medical: Not on file    Non-medical: Not on file  Tobacco Use  . Smoking status: Former Smoker    Types: Cigarettes    Last attempt to quit:  11/12/1988    Years since quitting: 29.8  . Smokeless tobacco: Never Used  Substance and Sexual Activity  . Alcohol use: Yes    Alcohol/week: 4.0 standard drinks    Types: 4 Standard drinks or equivalent per week    Comment: occasional wine   . Drug use: No  . Sexual activity: Never    Comment: lives alone, no dietary restrictions, widowed  Lifestyle  . Physical activity:    Days per week: Not on file    Minutes per session: Not on file  . Stress: Not on file  Relationships  . Social connections:    Talks on phone: Not on file    Gets together: Not on file    Attends religious service: Not on file    Active member of club or organization: Not on file    Attends meetings of clubs or organizations: Not on file    Relationship status: Not on file  . Intimate partner violence:    Fear of current or ex partner: Not on file    Emotionally abused: Not on file    Physically abused: Not on file    Forced  sexual activity: Not on file  Other Topics Concern  . Not on file  Social History Narrative  . Not on file    Outpatient Medications Prior to Visit  Medication Sig Dispense Refill  . aspirin 81 MG tablet Take 1 tablet (81 mg total) by mouth daily. 30 tablet   . Cholecalciferol (VITAMIN D) 2000 UNITS tablet Take 6,000 Units by mouth daily.     Marland Kitchen co-enzyme Q-10 30 MG capsule Take 30 mg by mouth daily.     . fenofibrate micronized (LOFIBRA) 134 MG capsule TAKE 1 CAPSULE DAILY 90 capsule 50  . fluticasone (FLONASE) 50 MCG/ACT nasal spray USE 2 SPRAYS IN EACH NOSTRIL DAILY AS NEEDED FOR ALLERGIES OR RHINITIS 48 g 1  . furosemide (LASIX) 40 MG tablet TAKE 1 TABLET DAILY AS DIRECTED 90 tablet 4  . levothyroxine (SYNTHROID, LEVOTHROID) 50 MCG tablet TAKE 1 TABLET DAILY BEFORE BREAKFAST 90 tablet 4  . losartan (COZAAR) 50 MG tablet TAKE 1 TABLET DAILY 90 tablet 1  . montelukast (SINGULAIR) 10 MG tablet TAKE 1 TABLET AT BEDTIME 90 tablet 4  . PATADAY 0.2 % SOLN Place 1 drop into both eyes daily.    Marland Kitchen PROAIR HFA 108 (90 Base) MCG/ACT inhaler USE 2 INHALATIONS EVERY 6 HOURS AS NEEDED FOR WHEEZING OR SHORTNESS OF BREATH 25.5 g 1  . sucralfate (CARAFATE) 1 g tablet Take 1 tablet (1 g total) by mouth 4 (four) times daily. 360 tablet 0  . ALPRAZolam (XANAX) 0.5 MG tablet Take 1 tablet (0.5 mg total) by mouth at bedtime as needed for anxiety. 90 tablet 1  . azelastine (ASTELIN) 137 MCG/SPRAY nasal spray Place 1 spray into the nose daily as needed. Use in each nostril as directed    . febuxostat (ULORIC) 40 MG tablet Take 1 tablet (40 mg total) by mouth daily. 90 tablet 1  . fenofibrate micronized (ANTARA) 130 MG capsule Take 1 capsule (130 mg total) by mouth daily before breakfast. Failed Statins, Simvastatin, Atorvastatin, Rosuvastatin 90 capsule 1  . fexofenadine (ALLEGRA ALLERGY) 180 MG tablet Take 1 tablet (180 mg total) by mouth daily. 90 tablet 1  . fluticasone (FLOVENT HFA) 110 MCG/ACT inhaler Inhale  2 puffs into the lungs 2 (two) times daily. 3 Inhaler 3  . gabapentin (NEURONTIN) 300 MG capsule TAKE 1 CAPSULE AT  BEDTIME 90 capsule 0  . pantoprazole (PROTONIX) 40 MG tablet Take 1 tablet (40 mg total) by mouth 2 (two) times daily. 180 tablet 5  . propranolol ER (INDERAL LA) 60 MG 24 hr capsule Take 1 capsule (60 mg total) by mouth daily. 90 capsule 1   No facility-administered medications prior to visit.     Allergies  Allergen Reactions  . Simvastatin Other (See Comments)    Hip pain  . Crestor [Rosuvastatin]     Left hip pain  . Lipitor [Atorvastatin]     Pain in hips  . Red Dye     Hip pain    Review of Systems  Constitutional: Positive for malaise/fatigue. Negative for fever.  HENT: Positive for congestion.   Eyes: Negative for blurred vision.  Respiratory: Negative for shortness of breath.   Cardiovascular: Negative for chest pain, palpitations and leg swelling.  Gastrointestinal: Positive for heartburn. Negative for abdominal pain, blood in stool and nausea.  Genitourinary: Negative for dysuria and frequency.  Musculoskeletal: Positive for joint pain. Negative for falls.  Skin: Negative for rash.  Neurological: Negative for dizziness, loss of consciousness and headaches.  Endo/Heme/Allergies: Negative for environmental allergies.  Psychiatric/Behavioral: Negative for depression. The patient is not nervous/anxious.        Objective:    Physical Exam  Constitutional: She is oriented to person, place, and time. She appears well-developed and well-nourished. No distress.  HENT:  Head: Normocephalic and atraumatic.  Nose: Nose normal.  Eyes: Right eye exhibits no discharge. Left eye exhibits no discharge.  Neck: Normal range of motion. Neck supple.  Cardiovascular: Normal rate and regular rhythm.  No murmur heard. Pulmonary/Chest: Effort normal and breath sounds normal.  Abdominal: Soft. Bowel sounds are normal. There is no tenderness.  Musculoskeletal: She  exhibits no edema.  Neurological: She is alert and oriented to person, place, and time.  Skin: Skin is warm and dry.  Psychiatric: She has a normal mood and affect.  Nursing note and vitals reviewed.   BP 122/60 (BP Location: Left Arm, Patient Position: Sitting, Cuff Size: Normal)   Pulse 73   Temp 98.3 F (36.8 C) (Oral)   Resp 18   Wt 158 lb (71.7 kg)   SpO2 95%   BMI 29.85 kg/m  Wt Readings from Last 3 Encounters:  09/04/18 158 lb (71.7 kg)  03/17/18 158 lb (71.7 kg)  07/09/17 160 lb (72.6 kg)     Lab Results  Component Value Date   WBC 9.5 09/04/2018   HGB 13.4 09/04/2018   HCT 40.2 09/04/2018   PLT 229.0 09/04/2018   GLUCOSE 109 (H) 09/04/2018   CHOL 221 (H) 09/04/2018   TRIG 143.0 09/04/2018   HDL 57.80 09/04/2018   LDLDIRECT 96.0 04/22/2017   LDLCALC 135 (H) 09/04/2018   ALT 7 09/04/2018   AST 22 09/04/2018   NA 139 09/04/2018   K 4.7 09/04/2018   CL 100 09/04/2018   CREATININE 1.40 (H) 09/04/2018   BUN 25 (H) 09/04/2018   CO2 29 09/04/2018   TSH 3.62 09/04/2018   INR 1.0 07/21/2010   HGBA1C 5.5 03/17/2018   MICROALBUR 0.9 01/31/2016    Lab Results  Component Value Date   TSH 3.62 09/04/2018   Lab Results  Component Value Date   WBC 9.5 09/04/2018   HGB 13.4 09/04/2018   HCT 40.2 09/04/2018   MCV 97.0 09/04/2018   PLT 229.0 09/04/2018   Lab Results  Component Value Date   NA 139 09/04/2018  K 4.7 09/04/2018   CO2 29 09/04/2018   GLUCOSE 109 (H) 09/04/2018   BUN 25 (H) 09/04/2018   CREATININE 1.40 (H) 09/04/2018   BILITOT 0.7 09/04/2018   ALKPHOS 81 09/04/2018   AST 22 09/04/2018   ALT 7 09/04/2018   PROT 7.5 09/04/2018   ALBUMIN 4.7 09/04/2018   CALCIUM 10.9 (H) 09/04/2018   ANIONGAP 11 04/20/2015   GFR 38.02 (L) 09/04/2018   Lab Results  Component Value Date   CHOL 221 (H) 09/04/2018   Lab Results  Component Value Date   HDL 57.80 09/04/2018   Lab Results  Component Value Date   LDLCALC 135 (H) 09/04/2018   Lab  Results  Component Value Date   TRIG 143.0 09/04/2018   Lab Results  Component Value Date   CHOLHDL 4 09/04/2018   Lab Results  Component Value Date   HGBA1C 5.5 03/17/2018       Assessment & Plan:   Problem List Items Addressed This Visit    Hypothyroidism    On Levothyroxine, continue to monitor      Relevant Medications   propranolol ER (INDERAL LA) 60 MG 24 hr capsule   Hyperlipidemia, mixed    Encouraged heart healthy diet, increase exercise, avoid trans fats, consider a krill oil cap daily      Relevant Medications   propranolol ER (INDERAL LA) 60 MG 24 hr capsule   Gout    Well controlled on U[place]      Essential hypertension    Well controlled, no changes to meds. Encouraged heart healthy diet such as the DASH diet and exercise as tolerated.       Relevant Medications   propranolol ER (INDERAL LA) 60 MG 24 hr capsule   GERD    Avoid offending foods, start probiotics. Do not eat large meals in late evening and consider raising head of bed.       Relevant Medications   pantoprazole (PROTONIX) 40 MG tablet   fexofenadine (ALLEGRA ALLERGY) 180 MG tablet   famotidine (PEPCID) 40 MG tablet   Other Relevant Orders   CBC (Completed)   Comprehensive metabolic panel (Completed)   Sleep apnea    Is not using      Relevant Medications   fexofenadine (ALLEGRA ALLERGY) 180 MG tablet   Ganglion cyst    Let wrist and symptomatic, referred to hand surgeon for futher consideration.       Relevant Medications   fexofenadine (ALLEGRA ALLERGY) 180 MG tablet   Other Relevant Orders   Ambulatory referral to Hand Surgery   Vertigo    Hydrate and is referred to PT for therapy      Relevant Orders   Ambulatory referral to Physical Therapy   Hoarseness of voice    Likely mulitfactorial and will treat as such and refer to ENT for further consideration      Relevant Orders   Ambulatory referral to ENT   TSH (Completed)   CBC (Completed)    Other Visit  Diagnoses    High risk medication use    -  Primary   Relevant Medications   fexofenadine (ALLEGRA ALLERGY) 180 MG tablet   Other Relevant Orders   Pain Mgmt, Profile 8 w/Conf, U   Urinary incontinence, unspecified type       Relevant Orders   Urinalysis   Urine Culture (Completed)   Vitamin D deficiency       Relevant Orders   VITAMIN D 25 Hydroxy (Vit-D Deficiency, Fractures) (  Completed)   Hyperlipidemia, unspecified hyperlipidemia type       Relevant Medications   propranolol ER (INDERAL LA) 60 MG 24 hr capsule   Other Relevant Orders   Lipid panel (Completed)      I have discontinued Christina Mckinney's azelastine. I have changed her febuxostat to ULORIC. I have also changed her gabapentin. Additionally, I am having her start on famotidine. Lastly, I am having her maintain her Vitamin D, co-enzyme Q-10, aspirin, PATADAY, PROAIR HFA, sucralfate, fenofibrate micronized, fluticasone, losartan, furosemide, montelukast, levothyroxine, ALPRAZolam, fluticasone, pantoprazole, propranolol ER, and fexofenadine.  Meds ordered this encounter  Medications  . ULORIC 40 MG tablet    Sig: Take 1 tablet (40 mg total) by mouth daily. Brand Name Uloric    Dispense:  90 tablet    Refill:  1    Brand name Uloric  . ALPRAZolam (XANAX) 0.5 MG tablet    Sig: Take 1 tablet (0.5 mg total) by mouth at bedtime as needed for anxiety.    Dispense:  90 tablet    Refill:  1  . fluticasone (FLOVENT HFA) 110 MCG/ACT inhaler    Sig: Inhale 2 puffs into the lungs 2 (two) times daily.    Dispense:  3 Inhaler    Refill:  3  . pantoprazole (PROTONIX) 40 MG tablet    Sig: Take 1 tablet (40 mg total) by mouth 2 (two) times daily.    Dispense:  180 tablet    Refill:  5  . propranolol ER (INDERAL LA) 60 MG 24 hr capsule    Sig: Take 1 capsule (60 mg total) by mouth daily.    Dispense:  90 capsule    Refill:  1  . fexofenadine (ALLEGRA ALLERGY) 180 MG tablet    Sig: Take 1 tablet (180 mg total) by mouth daily.     Dispense:  90 tablet    Refill:  1  . gabapentin (NEURONTIN) 300 MG capsule    Sig: Take 1 capsule (300 mg total) by mouth at bedtime.    Dispense:  90 capsule    Refill:  1  . famotidine (PEPCID) 40 MG tablet    Sig: Take 1 tablet (40 mg total) by mouth daily.    Dispense:  90 tablet    Refill:  1     [name], MD

## 2018-09-07 NOTE — Assessment & Plan Note (Signed)
On Levothyroxine, continue to monitor 

## 2018-09-07 NOTE — Assessment & Plan Note (Signed)
Likely mulitfactorial and will treat as such and refer to ENT for further consideration

## 2018-09-07 NOTE — Assessment & Plan Note (Signed)
Well controlled on Uloic

## 2018-09-07 NOTE — Assessment & Plan Note (Signed)
Hydrate and is referred to PT for therapy

## 2018-09-07 NOTE — Assessment & Plan Note (Signed)
Well controlled, no changes to meds. Encouraged heart healthy diet such as the DASH diet and exercise as tolerated.  °

## 2018-09-07 NOTE — Assessment & Plan Note (Signed)
Let wrist and symptomatic, referred to hand surgeon for futher consideration.

## 2018-09-07 NOTE — Assessment & Plan Note (Signed)
Encouraged heart healthy diet, increase exercise, avoid trans fats, consider a krill oil cap daily 

## 2018-09-08 ENCOUNTER — Telehealth: Payer: Self-pay | Admitting: Family Medicine

## 2018-09-08 NOTE — Telephone Encounter (Signed)
Copied from [place] [phone]. Topic: Quick Communication - See Telephone Encounter >> Sep 08, 2018  4:53 PM Ivar Drape wrote: CRM for notification. See Telephone encounter for: 09/08/18. Zachery Dauer w/[facility] [phone], Ref# [id number] stated that the patient's insurance company does not cover her pantoprazole (PROTONIX) 40 MG tablet in name brand.  So the pharmacist would like to know if he can use a generic brand.

## 2018-09-08 NOTE — Addendum Note (Signed)
Addended by: Magdalene Molly A on: 09/08/2018 01:40 PM   Modules accepted: Orders

## 2018-09-09 MED ORDER — PANTOPRAZOLE SODIUM 40 MG PO TBEC: 40.0000 mg | DELAYED_RELEASE_TABLET | Freq: Two times a day (BID) | ORAL | 5 refills | Status: DC

## 2018-09-09 NOTE — Telephone Encounter (Signed)
Medication sent in. 

## 2018-09-29 ENCOUNTER — Other Ambulatory Visit: Payer: Self-pay

## 2018-10-21 ENCOUNTER — Ambulatory Visit (INDEPENDENT_AMBULATORY_CARE_PROVIDER_SITE_OTHER): Payer: Self-pay | Admitting: Orthopaedic Surgery

## 2018-12-17 ENCOUNTER — Ambulatory Visit
Admission: EM | Admit: 2018-12-17 | Discharge: 2018-12-17 | Disposition: A | Discharge status: Home / Self Care | Payer: Medicare Other | Attending: Family Medicine | Admitting: Family Medicine

## 2018-12-17 ENCOUNTER — Inpatient Hospital Stay (HOSPITAL_COMMUNITY)
Admission: EM | Admit: 2018-12-17 | Discharge: 2018-12-21 | DRG: 871 | Disposition: A | Discharge status: Home / Self Care | Payer: Medicare Other | Attending: Internal Medicine | Admitting: Internal Medicine

## 2018-12-17 ENCOUNTER — Ambulatory Visit: Payer: Medicare Other

## 2018-12-17 ENCOUNTER — Other Ambulatory Visit: Payer: Self-pay

## 2018-12-17 ENCOUNTER — Encounter (HOSPITAL_COMMUNITY): Payer: Self-pay

## 2018-12-17 DIAGNOSIS — Z8349 Family history of other endocrine, nutritional and metabolic diseases: Secondary | ICD-10-CM

## 2018-12-17 DIAGNOSIS — R05 Cough: Secondary | ICD-10-CM | POA: Diagnosis not present

## 2018-12-17 DIAGNOSIS — I130 Hypertensive heart and chronic kidney disease with heart failure and stage 1 through stage 4 chronic kidney disease, or unspecified chronic kidney disease: Secondary | ICD-10-CM | POA: Diagnosis not present

## 2018-12-17 DIAGNOSIS — Z79890 Hormone replacement therapy (postmenopausal): Secondary | ICD-10-CM

## 2018-12-17 DIAGNOSIS — E782 Mixed hyperlipidemia: Secondary | ICD-10-CM | POA: Diagnosis present

## 2018-12-17 DIAGNOSIS — Z9102 Food additives allergy status: Secondary | ICD-10-CM

## 2018-12-17 DIAGNOSIS — Z79899 Other long term (current) drug therapy: Secondary | ICD-10-CM

## 2018-12-17 DIAGNOSIS — J441 Chronic obstructive pulmonary disease with (acute) exacerbation: Secondary | ICD-10-CM | POA: Diagnosis not present

## 2018-12-17 DIAGNOSIS — Z888 Allergy status to other drugs, medicaments and biological substances status: Secondary | ICD-10-CM

## 2018-12-17 DIAGNOSIS — H66002 Acute suppurative otitis media without spontaneous rupture of ear drum, left ear: Secondary | ICD-10-CM | POA: Diagnosis not present

## 2018-12-17 DIAGNOSIS — H1033 Unspecified acute conjunctivitis, bilateral: Secondary | ICD-10-CM | POA: Diagnosis not present

## 2018-12-17 DIAGNOSIS — F418 Other specified anxiety disorders: Secondary | ICD-10-CM | POA: Diagnosis present

## 2018-12-17 DIAGNOSIS — D539 Nutritional anemia, unspecified: Secondary | ICD-10-CM | POA: Diagnosis present

## 2018-12-17 DIAGNOSIS — H3530 Unspecified macular degeneration: Secondary | ICD-10-CM | POA: Diagnosis present

## 2018-12-17 DIAGNOSIS — J9601 Acute respiratory failure with hypoxia: Secondary | ICD-10-CM | POA: Diagnosis not present

## 2018-12-17 DIAGNOSIS — K219 Gastro-esophageal reflux disease without esophagitis: Secondary | ICD-10-CM | POA: Diagnosis present

## 2018-12-17 DIAGNOSIS — R0902 Hypoxemia: Secondary | ICD-10-CM | POA: Diagnosis not present

## 2018-12-17 DIAGNOSIS — J40 Bronchitis, not specified as acute or chronic: Secondary | ICD-10-CM

## 2018-12-17 DIAGNOSIS — Z87891 Personal history of nicotine dependence: Secondary | ICD-10-CM

## 2018-12-17 DIAGNOSIS — Z7951 Long term (current) use of inhaled steroids: Secondary | ICD-10-CM

## 2018-12-17 DIAGNOSIS — I5022 Chronic systolic (congestive) heart failure: Secondary | ICD-10-CM | POA: Diagnosis not present

## 2018-12-17 DIAGNOSIS — Z86711 Personal history of pulmonary embolism: Secondary | ICD-10-CM

## 2018-12-17 DIAGNOSIS — R0602 Shortness of breath: Secondary | ICD-10-CM

## 2018-12-17 DIAGNOSIS — Z7982 Long term (current) use of aspirin: Secondary | ICD-10-CM

## 2018-12-17 DIAGNOSIS — G2581 Restless legs syndrome: Secondary | ICD-10-CM | POA: Diagnosis present

## 2018-12-17 DIAGNOSIS — R5381 Other malaise: Secondary | ICD-10-CM

## 2018-12-17 DIAGNOSIS — N183 Chronic kidney disease, stage 3 (moderate): Secondary | ICD-10-CM | POA: Diagnosis present

## 2018-12-17 DIAGNOSIS — Z86718 Personal history of other venous thrombosis and embolism: Secondary | ICD-10-CM

## 2018-12-17 DIAGNOSIS — M109 Gout, unspecified: Secondary | ICD-10-CM | POA: Diagnosis present

## 2018-12-17 DIAGNOSIS — Z9049 Acquired absence of other specified parts of digestive tract: Secondary | ICD-10-CM

## 2018-12-17 DIAGNOSIS — E039 Hypothyroidism, unspecified: Secondary | ICD-10-CM | POA: Diagnosis present

## 2018-12-17 DIAGNOSIS — A419 Sepsis, unspecified organism: Secondary | ICD-10-CM | POA: Diagnosis not present

## 2018-12-17 DIAGNOSIS — G629 Polyneuropathy, unspecified: Secondary | ICD-10-CM | POA: Diagnosis present

## 2018-12-17 DIAGNOSIS — Z833 Family history of diabetes mellitus: Secondary | ICD-10-CM

## 2018-12-17 DIAGNOSIS — Z8719 Personal history of other diseases of the digestive system: Secondary | ICD-10-CM

## 2018-12-17 DIAGNOSIS — Z90710 Acquired absence of both cervix and uterus: Secondary | ICD-10-CM

## 2018-12-17 DIAGNOSIS — J189 Pneumonia, unspecified organism: Secondary | ICD-10-CM | POA: Diagnosis not present

## 2018-12-17 DIAGNOSIS — Z8249 Family history of ischemic heart disease and other diseases of the circulatory system: Secondary | ICD-10-CM

## 2018-12-17 DIAGNOSIS — Z8711 Personal history of peptic ulcer disease: Secondary | ICD-10-CM

## 2018-12-17 DIAGNOSIS — Z96651 Presence of right artificial knee joint: Secondary | ICD-10-CM | POA: Diagnosis present

## 2018-12-17 DIAGNOSIS — G4730 Sleep apnea, unspecified: Secondary | ICD-10-CM | POA: Diagnosis present

## 2018-12-17 DIAGNOSIS — G4700 Insomnia, unspecified: Secondary | ICD-10-CM | POA: Diagnosis present

## 2018-12-17 LAB — BASIC METABOLIC PANEL
Anion gap: 9 (ref 5–15)
BUN: 17 mg/dL (ref 8–23)
CALCIUM: 9.8 mg/dL (ref 8.9–10.3)
CO2: 25 mmol/L (ref 22–32)
Chloride: 102 mmol/L (ref 98–111)
Creatinine, Ser: 1.06 mg/dL — ABNORMAL HIGH (ref 0.44–1.00)
GFR calc Af Amer: 56 mL/min — ABNORMAL LOW (ref >60)
GFR calc non Af Amer: 48 mL/min — ABNORMAL LOW (ref >60)
Glucose, Bld: 105 mg/dL — ABNORMAL HIGH (ref 70–99)
Potassium: 4.9 mmol/L (ref 3.5–5.1)
Sodium: 136 mmol/L (ref 135–145)

## 2018-12-17 LAB — CBC
HCT: 36.5 % (ref 36.0–46.0)
Hemoglobin: 11.8 g/dL — ABNORMAL LOW (ref 12.0–15.0)
MCH: 31.7 pg (ref 26.0–34.0)
MCHC: 32.3 g/dL (ref 30.0–36.0)
MCV: 98.1 fL (ref 80.0–100.0)
NRBC: 0 % (ref 0.0–0.2)
Platelets: 239 10*3/uL (ref 150–400)
RBC: 3.72 MIL/uL — ABNORMAL LOW (ref 3.87–5.11)
RDW: 13.2 % (ref 11.5–15.5)
WBC: 11.4 10*3/uL — ABNORMAL HIGH (ref 4.0–10.5)

## 2018-12-17 LAB — TROPONIN I: Troponin I: <0.03 ng/mL (ref <0.03)

## 2018-12-17 MED ORDER — CIPROFLOXACIN HCL 0.3 % OP SOLN
2.0000 [drp] | OPHTHALMIC | Status: DC
  Administered 2018-12-17 – 2018-12-21 (×18): 2 [drp] via OPHTHALMIC
  Filled 2018-12-17: qty 2.5

## 2018-12-17 MED ORDER — GABAPENTIN 300 MG PO CAPS
300.0000 mg | ORAL_CAPSULE | Freq: Every day | ORAL | Status: DC
  Administered 2018-12-18 – 2018-12-20 (×3): 300 mg via ORAL
  Filled 2018-12-17 (×3): qty 1

## 2018-12-17 MED ORDER — ONDANSETRON HCL 4 MG/2ML IJ SOLN
4.0000 mg | Freq: Four times a day (QID) | INTRAMUSCULAR | Status: DC | PRN
  Administered 2018-12-18: 4 mg via INTRAVENOUS
  Filled 2018-12-17: qty 2

## 2018-12-17 MED ORDER — ALPRAZOLAM 0.5 MG PO TABS: 0.5000 mg | ORAL_TABLET | Freq: Every evening | ORAL | Status: DC | PRN

## 2018-12-17 MED ORDER — LEVOTHYROXINE SODIUM 50 MCG PO TABS
50.0000 ug | ORAL_TABLET | Freq: Every day | ORAL | Status: DC
  Administered 2018-12-18 – 2018-12-21 (×4): 50 ug via ORAL
  Filled 2018-12-17 (×4): qty 1

## 2018-12-17 MED ORDER — PANTOPRAZOLE SODIUM 40 MG PO TBEC
40.0000 mg | DELAYED_RELEASE_TABLET | Freq: Two times a day (BID) | ORAL | Status: DC
  Administered 2018-12-18 – 2018-12-21 (×7): 40 mg via ORAL
  Filled 2018-12-17 (×7): qty 1

## 2018-12-17 MED ORDER — FLUTICASONE PROPIONATE HFA 110 MCG/ACT IN AERO: 2.0000 | INHALATION_SPRAY | Freq: Two times a day (BID) | RESPIRATORY_TRACT | Status: DC

## 2018-12-17 MED ORDER — IPRATROPIUM-ALBUTEROL 0.5-2.5 (3) MG/3ML IN SOLN
3.0000 mL | Freq: Once | RESPIRATORY_TRACT | Status: AC
  Administered 2018-12-17: 3 mL via RESPIRATORY_TRACT

## 2018-12-17 MED ORDER — IPRATROPIUM-ALBUTEROL 0.5-2.5 (3) MG/3ML IN SOLN
3.0000 mL | Freq: Once | RESPIRATORY_TRACT | Status: AC
  Administered 2018-12-17: 3 mL via RESPIRATORY_TRACT
  Filled 2018-12-17: qty 3

## 2018-12-17 MED ORDER — ENOXAPARIN SODIUM 40 MG/0.4ML ~~LOC~~ SOLN
40.0000 mg | Freq: Every day | SUBCUTANEOUS | Status: DC
  Administered 2018-12-18 – 2018-12-21 (×4): 40 mg via SUBCUTANEOUS
  Filled 2018-12-17 (×6): qty 0.4

## 2018-12-17 MED ORDER — SODIUM CHLORIDE 0.9 % IV BOLUS
500.0000 mL | Freq: Once | INTRAVENOUS | Status: AC
  Administered 2018-12-17: 500 mL via INTRAVENOUS

## 2018-12-17 MED ORDER — OLOPATADINE HCL 0.1 % OP SOLN
1.0000 [drp] | Freq: Two times a day (BID) | OPHTHALMIC | Status: DC
  Administered 2018-12-18 – 2018-12-21 (×7): 1 [drp] via OPHTHALMIC
  Filled 2018-12-17: qty 5

## 2018-12-17 MED ORDER — SODIUM CHLORIDE 0.9 % IV SOLN
1.0000 g | Freq: Once | INTRAVENOUS | Status: AC
  Administered 2018-12-17: 1 g via INTRAVENOUS
  Filled 2018-12-17: qty 10

## 2018-12-17 MED ORDER — FEBUXOSTAT 40 MG PO TABS
40.0000 mg | ORAL_TABLET | Freq: Every day | ORAL | Status: DC
  Administered 2018-12-18 – 2018-12-21 (×4): 40 mg via ORAL
  Filled 2018-12-17 (×4): qty 1

## 2018-12-17 MED ORDER — ASPIRIN EC 81 MG PO TBEC
81.0000 mg | DELAYED_RELEASE_TABLET | Freq: Every day | ORAL | Status: DC
  Administered 2018-12-18 – 2018-12-21 (×4): 81 mg via ORAL
  Filled 2018-12-17 (×4): qty 1

## 2018-12-17 MED ORDER — GUAIFENESIN ER 600 MG PO TB12
1200.0000 mg | ORAL_TABLET | Freq: Two times a day (BID) | ORAL | Status: DC | PRN
  Administered 2018-12-18 – 2018-12-19 (×2): 1200 mg via ORAL
  Filled 2018-12-17 (×3): qty 2

## 2018-12-17 MED ORDER — SODIUM CHLORIDE 0.9 % IV SOLN
1.0000 g | INTRAVENOUS | Status: DC
  Administered 2018-12-18 – 2018-12-19 (×2): 1 g via INTRAVENOUS
  Filled 2018-12-17 (×2): qty 1
  Filled 2018-12-17: qty 10

## 2018-12-17 MED ORDER — METHYLPREDNISOLONE SODIUM SUCC 125 MG IJ SOLR
125.0000 mg | Freq: Once | INTRAMUSCULAR | Status: DC
  Filled 2018-12-17: qty 2

## 2018-12-17 MED ORDER — BENZONATATE 100 MG PO CAPS: 100.0000 mg | ORAL_CAPSULE | Freq: Three times a day (TID) | ORAL | Status: DC | PRN

## 2018-12-17 MED ORDER — SODIUM CHLORIDE 0.9 % IV SOLN
500.0000 mg | INTRAVENOUS | Status: DC
  Administered 2018-12-18: 500 mg via INTRAVENOUS
  Filled 2018-12-17 (×2): qty 500

## 2018-12-17 MED ORDER — PROPRANOLOL HCL ER 60 MG PO CP24
60.0000 mg | ORAL_CAPSULE | Freq: Every day | ORAL | Status: DC
  Administered 2018-12-18 – 2018-12-21 (×4): 60 mg via ORAL
  Filled 2018-12-17 (×4): qty 1

## 2018-12-17 MED ORDER — MONTELUKAST SODIUM 10 MG PO TABS
10.0000 mg | ORAL_TABLET | Freq: Every day | ORAL | Status: DC
  Administered 2018-12-18 – 2018-12-20 (×3): 10 mg via ORAL
  Filled 2018-12-17 (×4): qty 1

## 2018-12-17 MED ORDER — IPRATROPIUM-ALBUTEROL 0.5-2.5 (3) MG/3ML IN SOLN: 3.0000 mL | Freq: Four times a day (QID) | RESPIRATORY_TRACT | Status: DC | PRN

## 2018-12-17 MED ORDER — SODIUM CHLORIDE 0.9 % IV SOLN
500.0000 mg | Freq: Once | INTRAVENOUS | Status: AC
  Administered 2018-12-17: 500 mg via INTRAVENOUS
  Filled 2018-12-17: qty 500

## 2018-12-17 NOTE — ED Notes (Signed)
Pt ambulated from bed to hall past EMS bay and to restroom. Pt O2 remains between 91-93% while ambulating.  Pt sts "I gotta have something to get that stuff out my chest, I feel short of breath."  Pt breathing did become more labored at this point.  Pt used restroom and ambulated back to bed.  RN notified.

## 2018-12-17 NOTE — H&P (Signed)
History and Physical   Christina Mckinney UKG:[id number] DOB: Dec 21, 1931 DOA: 12/17/2018  PCP: Mosie Lukes, MD  Chief Complaint: cough and shortness of breath  HPI: This is an 85 year old woman with medical problems including hypertension, self-reported COPD, history of VTE in 2010 that was provoked no longer on anticoagulation, former smoker who presents with shortness of breath and productive cough.  History is obtained via chart review, discussion with the emergency medicine team, and patient report.  At baseline, the patient lives alone with her cat.  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  She reports the home renovation has been going on for months and that she is frequently exposed to a high dust burden in her home.  She reports symptoms have been going on for weeks which include a productive cough of green to yellow sputum, as well as increasing fatigue and shortness of breath.  She reports having some nasal congestion as well as left ear pain.  She has not been on antibiotics recently, but has been taking Mucinex in hopes of alleviating the congestion.  The dyspnea has continued to worsen over the past 24 to 48 hours, thus she sought medical attention.  She denies any hemoptysis, dysuria, syncope, chest pain.  She has had nausea and vomiting.  At baseline, she reports being independent in her ADLs and IADLs, continues to drive.  Has a daughter that lives locally. Reports generalized weakness and fatigue.  ED Course: In the emergency department vital signs remarkable for tachycardia to 103, increased respiratory rate 21, systolic blood pressure ranging from 130-175.  O2 saturation per emergency medicine team report was 89%.  Diagnostics remarkable for white count of 11.4, hemoglobin 0.8.  CMP with a creatinine of 1.1, glucose 105.  Troponin undetectable.  EKG revealed sinus rhythm.  Chest x-ray revealed cardiomegaly, mild  vascular congestion, and COPD.  The patient was given ceftriaxone, 500 cc of normal saline, azithromycin, and hospital medicine consulted for further management.  Review of Systems: A complete ROS was obtained; pertinent positives negatives are denoted in the HPI. Otherwise, all systems are negative.   Past Medical History:  Diagnosis Date  . Anemia, unspecified   . Anxiety and depression 09/18/2015  . Anxiety state, unspecified   . Chest pain   . Chronic systolic CHF (congestive heart failure) ([place]) 08/12/2015  . Depression with anxiety 06/02/2007   Qualifier: Diagnosis of  By: Wynona Luna   . Depressive disorder, not elsewhere classified   . Diverticulitis   . DVT (deep venous thrombosis) ([place])   . Dyspnea 07/22/2015  . Edema 06/12/2014  . Essential and other specified forms of tremor   . GERD (gastroesophageal reflux disease)   . Gout, unspecified   . Internal hemorrhoids without mention of complication   . Macular degeneration 08/07/2015  . Osteoarthritis   . Other abnormal glucose   . Other and unspecified hyperlipidemia   . Personal history of colonic polyps   . Personal history of peptic ulcer disease   . Pulmonary embolism ([place])   . Unspecified disorder of bladder   . Unspecified essential hypertension   . Unspecified hypothyroidism   . Varicose veins    Social History   Socioeconomic History  . Marital status: Widowed    Spouse name: Not on file  . Number of children: 3  . Years of education: Not on file  . Highest education level: Not  on file  Occupational History  . Occupation: Retired   Scientific laboratory technician  . Financial resource strain: Not on file  . Food insecurity:    Worry: Not on file    Inability: Not on file  . Transportation needs:    Medical: Not on file    Non-medical: Not on file  Tobacco Use  . Smoking status: Former Smoker    Types: Cigarettes    Last attempt to quit: 11/12/1988    Years since quitting: 30.1  . Smokeless tobacco: Never Used    Substance and Sexual Activity  . Alcohol use: Yes    Alcohol/week: 4.0 standard drinks    Types: 4 Standard drinks or equivalent per week    Comment: occasional wine   . Drug use: No  . Sexual activity: Never    Comment: lives alone, no dietary restrictions, widowed  Lifestyle  . Physical activity:    Days per week: Not on file    Minutes per session: Not on file  . Stress: Not on file  Relationships  . Social connections:    Talks on phone: Not on file    Gets together: Not on file    Attends religious service: Not on file    Active member of club or organization: Not on file    Attends meetings of clubs or organizations: Not on file    Relationship status: Not on file  . Intimate partner violence:    Fear of current or ex partner: Not on file    Emotionally abused: Not on file    Physically abused: Not on file    Forced sexual activity: Not on file  Other Topics Concern  . Not on file  Social History Narrative  . Not on file   Family History  Problem Relation Age of Onset  . Other Mother        varicose veins  . Hip fracture Mother   . Heart disease Father        CHF  . Hyperlipidemia Father   . Heart attack Father   . Diabetes Daughter   . Heart disease Daughter   . Hyperlipidemia Daughter   . Hypertension Daughter   . Aneurysm Daughter   . Cancer Sister        stomach  . Heart disease Sister   . Heart disease Brother   . Colon cancer Neg Hx     Physical Exam: Vitals:   12/17/18 1822 12/17/18 1841 12/17/18 2224  BP: (!) 175/70  (!) 130/115  Pulse: 89  (!) 103  Resp: 18  (!) 21  Temp: 99.1 F (37.3 C)    TempSrc: Oral    SpO2: 94%  100%  Weight:  74.8 kg   Height:  5\' 1"  (1.549 m)    General: Elderly woman, in mild distress due to persistent coughing and posttussive emesis ENT: Left ear with bulging tympanic membrane, erythematous, no scleral icterus Cardiovascular: RRR. No M/R/G. No LE edema.  Respiratory: Breathing room air, having a hacking  productive cough with green sputum, no focal wheezes or rhonchi, fair air movement throughout Abdomen: Soft, non-tender.  Skin: No rash or induration seen on limited exam. Musculoskeletal: Grossly normal tone BUE/BLE. Appropriate ROM.  Sits up without any assistance Psychiatric: Grossly normal mood and affect. Neurologic: Moves all extremities in coordinated fashion.  Alert and answering questions appropriately  I have personally reviewed the following labs, culture data, and imaging studies.  Assessment/Plan:  #Sepsis due to respiratory source,  suspected bacterial source #COPD exacerbation due to above Course: insidious onset of multi-week cough and dyspnea that has progressed, patient reports triggered by ongoing home remodeling and dust exposure; found to have at least 2 SIRS criteria positive (tachycardia and RR increased) and leukocytosis. A/P: suspect a lower respiratory tract infection on the basis of history and clinical picture; can consider chest CT scan to further evaluate for such if it would change management; for now - will continue coverage for typical community acquired bacterial pathogens with ceftriaxone + azithromycin as we obtain more diagnostics (RVP, sputum culture, procalcitonin).  She does meet criteria for at least a COPD exacerbation (with increased sputum production, purulence, and dyspnea) - thus, will provide prednisone 5 day burst with initially scheduled bronchodilators. Cough suppressants and anti-emetics ordered for supportive care.  Consider transition to sole fluoroquinolone if clinical course improves.  #Other problems: -Left ear, otitis media: continue antimicrobial coverage as above -HTN:  Continue BB, plan to add back ARB and furosemide in AM if clinically stable / improved -Hypothyroidism: continue home thyroid replacement -Gout: not in flare, continue home medication -Insomnia: continue home low dose benzodiazepine prn  DVT prophylaxis: Subq  Lovenox Code Status: full Disposition Plan: Anticipate D/C home when medically ready Consults called: none Admission status: admit to hospital medicine, floor level of care   Cheri Rous, MD [facility] IPJR:[phone]  If 7PM-7AM, please contact night-coverage [email or web address] Password [email or web address]  This document was created using the aid of voice recognition / dication software.

## 2018-12-17 NOTE — ED Triage Notes (Signed)
Pt c/o cough with brown sputum, fever, eye drainage and SOB x3days; states became SOB on exertion on the way here

## 2018-12-17 NOTE — Discharge Instructions (Addendum)
Go directly to the Emergency Department for further evaluation. You oxygen levels were 89-90% here which is too low. These increased to 96-97% when we placed you on oxygen. This means you will need to be evaluated for hospital admission.  Meds ordered this encounter  Medications   ipratropium-albuterol (DUONEB) 0.5-2.5 (3) MG/3ML nebulizer solution 3 mL
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
## 2018-12-17 NOTE — ED Notes (Signed)
Pt is alert and oriented x 4 and is  Escorted with [facility]. Pt is c/o generalized body aches and chest congestion.

## 2018-12-17 NOTE — ED Triage Notes (Addendum)
Pt was sent from urgent care. Pt was told her O2 levels were at 89%.  Pt was at urgent care because she was feeling weak. Pt has had a productive cough. Pt states she has been taking mucinex without issue. Pt states that her ceiling is being scraped for popcorn ceilings and there is a lot of dust in the house. Pt also c/o left ear pain.

## 2018-12-18 ENCOUNTER — Other Ambulatory Visit: Payer: Self-pay

## 2018-12-18 DIAGNOSIS — J441 Chronic obstructive pulmonary disease with (acute) exacerbation: Secondary | ICD-10-CM

## 2018-12-18 DIAGNOSIS — D539 Nutritional anemia, unspecified: Secondary | ICD-10-CM

## 2018-12-18 DIAGNOSIS — I10 Essential (primary) hypertension: Secondary | ICD-10-CM | POA: Diagnosis not present

## 2018-12-18 LAB — COMPREHENSIVE METABOLIC PANEL
ALT: 11 U/L (ref 0–44)
AST: 29 U/L (ref 15–41)
Albumin: 3.3 g/dL — ABNORMAL LOW (ref 3.5–5.0)
Alkaline Phosphatase: 60 U/L (ref 38–126)
Anion gap: 7 (ref 5–15)
BUN: 13 mg/dL (ref 8–23)
CO2: 23 mmol/L (ref 22–32)
Calcium: 9 mg/dL (ref 8.9–10.3)
Chloride: 106 mmol/L (ref 98–111)
Creatinine, Ser: 0.99 mg/dL (ref 0.44–1.00)
GFR calc Af Amer: >60 mL/min (ref >60)
GFR calc non Af Amer: 52 mL/min — ABNORMAL LOW (ref >60)
Glucose, Bld: 185 mg/dL — ABNORMAL HIGH (ref 70–99)
Potassium: 3.7 mmol/L (ref 3.5–5.1)
Sodium: 136 mmol/L (ref 135–145)
Total Bilirubin: 0.7 mg/dL (ref 0.3–1.2)
Total Protein: 6.7 g/dL (ref 6.5–8.1)

## 2018-12-18 LAB — CBC
HCT: 33.4 % — ABNORMAL LOW (ref 36.0–46.0)
HEMOGLOBIN: 10.6 g/dL — AB (ref 12.0–15.0)
MCH: 32.1 pg (ref 26.0–34.0)
MCHC: 31.7 g/dL (ref 30.0–36.0)
MCV: 101.2 fL — ABNORMAL HIGH (ref 80.0–100.0)
Platelets: 214 10*3/uL (ref 150–400)
RBC: 3.3 MIL/uL — ABNORMAL LOW (ref 3.87–5.11)
RDW: 13.1 % (ref 11.5–15.5)
WBC: 11.2 10*3/uL — ABNORMAL HIGH (ref 4.0–10.5)
nRBC: 0 % (ref 0.0–0.2)

## 2018-12-18 LAB — PROCALCITONIN: Procalcitonin: 0.15 ng/mL

## 2018-12-18 MED ORDER — SODIUM CHLORIDE 0.9 % IV SOLN
INTRAVENOUS | Status: DC | PRN
  Administered 2018-12-18: 250 mL via INTRAVENOUS

## 2018-12-18 MED ORDER — PREDNISONE 20 MG PO TABS
40.0000 mg | ORAL_TABLET | Freq: Every day | ORAL | Status: DC
  Administered 2018-12-19 – 2018-12-21 (×3): 40 mg via ORAL
  Filled 2018-12-18 (×4): qty 2

## 2018-12-18 MED ORDER — ALBUTEROL SULFATE (2.5 MG/3ML) 0.083% IN NEBU: 2.5000 mg | INHALATION_SOLUTION | RESPIRATORY_TRACT | Status: DC | PRN

## 2018-12-18 MED ORDER — IPRATROPIUM-ALBUTEROL 0.5-2.5 (3) MG/3ML IN SOLN
3.0000 mL | Freq: Two times a day (BID) | RESPIRATORY_TRACT | Status: DC
  Administered 2018-12-18 – 2018-12-21 (×6): 3 mL via RESPIRATORY_TRACT
  Filled 2018-12-18 (×6): qty 3

## 2018-12-18 MED ORDER — IPRATROPIUM-ALBUTEROL 0.5-2.5 (3) MG/3ML IN SOLN
3.0000 mL | Freq: Three times a day (TID) | RESPIRATORY_TRACT | Status: DC
  Administered 2018-12-18: 3 mL via RESPIRATORY_TRACT
  Filled 2018-12-18: qty 3

## 2018-12-18 MED ORDER — IPRATROPIUM-ALBUTEROL 0.5-2.5 (3) MG/3ML IN SOLN
3.0000 mL | Freq: Four times a day (QID) | RESPIRATORY_TRACT | Status: DC
  Administered 2018-12-18: 3 mL via RESPIRATORY_TRACT
  Filled 2018-12-18: qty 3

## 2018-12-18 MED ORDER — BUDESONIDE 0.5 MG/2ML IN SUSP
0.5000 mg | Freq: Two times a day (BID) | RESPIRATORY_TRACT | Status: DC
  Administered 2018-12-18 – 2018-12-21 (×8): 0.5 mg via RESPIRATORY_TRACT
  Filled 2018-12-18 (×9): qty 2

## 2018-12-18 NOTE — ED Notes (Signed)
ED TO INPATIENT HANDOFF REPORT  Name/Age/Gender Christina Mckinney 85 y.o. female  Code Status    Code Status Orders  (From admission, onward)         Start     Ordered   12/17/18 2354  Full code  Continuous     12/17/18 2353        Code Status History    Date Active Date Inactive Code Status Order ID Comments User Context   04/20/2015 0806 04/25/2015 2103 Full Code [id number]  Rolm Bookbinder, MD Inpatient      Home/SNF/Other Home  Chief Complaint Oxygen levels too low (send from [facility])  Level of Care/Admitting Diagnosis ED Disposition    ED Disposition Condition [place]: [facility] [[id number]]  Level of Care: Med-Surg [16]  Diagnosis: Sepsis due to pneumonia [facility]) [[id number]]  Admitting Physician: Vilma Prader [[id number]]  Attending Physician: Vilma Prader [[id number]]  PT Class (Do Not Modify): Observation [104]  PT Acc Code (Do Not Modify): Observation [10022]       Medical History Past Medical History:  Diagnosis Date  . Anemia, unspecified   . Anxiety and depression 09/18/2015  . Anxiety state, unspecified   . Chest pain   . Chronic systolic CHF (congestive heart failure) ([place]) 08/12/2015  . Depression with anxiety 06/02/2007   Qualifier: Diagnosis of  By: Wynona Luna   . Depressive disorder, not elsewhere classified   . Diverticulitis   . DVT (deep venous thrombosis) ([place])   . Dyspnea 07/22/2015  . Edema 06/12/2014  . Essential and other specified forms of tremor   . GERD (gastroesophageal reflux disease)   . Gout, unspecified   . Internal hemorrhoids without mention of complication   . Macular degeneration 08/07/2015  . Osteoarthritis   . Other abnormal glucose   . Other and unspecified hyperlipidemia   . Personal history of colonic polyps   . Personal history of peptic ulcer disease   . Pulmonary embolism ([place])   . Unspecified disorder of bladder   . Unspecified essential hypertension   .  Unspecified hypothyroidism   . Varicose veins     Allergies Allergies  Allergen Reactions  . Simvastatin Other (See Comments)    Hip pain  . Crestor [Rosuvastatin]     Left hip pain  . Lipitor [Atorvastatin]     Pain in hips  . Red Dye     Hip pain  . Statins Other (See Comments)    Muscle aches, could not walk    IV Location/Drains/Wounds Patient Lines/Drains/Airways Status   Active Line/Drains/Airways    Name:   Placement date:   Placement time:   Site:   Days:   Peripheral IV 10/01/12 Right Forearm   10/01/12    2003    Forearm   2269   Peripheral IV 12/17/18 Right Antecubital   12/17/18    2056    Antecubital   1   Wound / Incision (Open or Dehisced) 04/19/15 Other (Comment) Leg Right;Lower approx 3 cm skin tear.    04/19/15    1805    Leg   1339          Labs/Imaging Results for orders placed or performed during the hospital encounter of 12/17/18 (from the past 48 hour(s))  CBC     Status: Abnormal   Collection Time: 12/17/18  8:53 PM  Result Value Ref Range   WBC 11.4 (H) 4.0 - 10.5 K/uL  RBC 3.72 (L) 3.87 - 5.11 MIL/uL   Hemoglobin 11.8 (L) 12.0 - 15.0 g/dL   HCT 36.5 36.0 - 46.0 %   MCV 98.1 80.0 - 100.0 fL   MCH 31.7 26.0 - 34.0 pg   MCHC 32.3 30.0 - 36.0 g/dL   RDW 13.2 11.5 - 15.5 %   Platelets 239 150 - 400 K/uL   nRBC 0.0 0.0 - 0.2 %    Comment: Performed at [facility], [place] [address] [place], [place] [address]  Basic metabolic panel     Status: Abnormal   Collection Time: 12/17/18  8:53 PM  Result Value Ref Range   Sodium 136 135 - 145 mmol/L   Potassium 4.9 3.5 - 5.1 mmol/L   Chloride 102 98 - 111 mmol/L   CO2 25 22 - 32 mmol/L   Glucose, Bld 105 (H) 70 - 99 mg/dL   BUN 17 8 - 23 mg/dL   Creatinine, Ser 1.06 (H) 0.44 - 1.00 mg/dL   Calcium 9.8 8.9 - 10.3 mg/dL   GFR calc non Af Amer 48 (L) >60 mL/min   GFR calc Af Amer 56 (L) >60 mL/min   Anion gap 9 5 - 15    Comment: Performed at [facility] [address] [place], [place] [address]  Troponin I - ONCE - STAT     Status: None   Collection Time: 12/17/18  8:53 PM  Result Value Ref Range   Troponin I <0.03 <0.03 ng/mL    Comment: Performed at [facility], [place] [address] [place], [place] [address]   Dg Chest 2 View  Result Date: 12/17/2018 CLINICAL DATA:  Cough with thick brown sputum and wheezing. Shortness of breath. EXAM: CHEST - 2 VIEW COMPARISON:  None. FINDINGS: The heart is enlarged. Mild vascular congestion. Small RIGHT pleural effusion. Healed sternal fracture. Degenerative change thoracic spine. Hyperinflation consistent with COPD. BILATERAL shoulder replacements. IMPRESSION: Cardiomegaly. Mild vascular congestion. COPD. Small RIGHT pleural effusion. Electronically Signed   By: Staci Righter M.D.   On: 12/17/2018 17:41   EKG Interpretation  Date/Time:  Wednesday December 17 2018 21:06:21 EST Ventricular Rate:  89 PR Interval:    QRS Duration: 90 QT Interval:  375 QTC Calculation: 457 R Axis:   48 Text Interpretation:  Sinus rhythm Probable left atrial enlargement RSR' in V1 or V2, right VCD or RVH No significant change since last tracing Confirmed by Isla Pence [phone]) on 12/17/2018 11:13:14 PM   Pending Labs Unresulted Labs (From admission, onward)    Start     Ordered   12/18/18 0500  Comprehensive metabolic panel  Tomorrow morning,   R     12/17/18 2353   12/18/18 0500  CBC  Tomorrow morning,   R     12/17/18 2353   12/18/18 0500  Procalcitonin  Daily,   R     12/17/18 2353   12/17/18 2354  Procalcitonin - Baseline  ONCE - STAT,   STAT     12/17/18 2353   12/17/18 2354  Respiratory Panel by PCR  (Respiratory virus panel with precautions)  Once,   R     12/17/18 2353   12/17/18 2354  Expectorated sputum assessment w rflx to resp cult  Once,   R     12/17/18 2353          Vitals/Pain Today's Vitals   12/17/18 1822 12/17/18 1840 12/17/18 1841 12/17/18 2224  BP: (!) 175/70   Marland Kitchen)  130/115  Pulse: 89   (!) 103  Resp: 18   (!) 21  Temp: 99.1 F (37.3 C)     TempSrc: Oral     SpO2: 94%   100%  Weight:   165 lb (74.8 kg)   Height:   5\' 1"  (1.549 m)   PainSc:  5       Isolation Precautions Droplet precaution  Medications Medications  ciprofloxacin (CILOXAN) 0.3 % ophthalmic solution 2 drop (2 drops Both Eyes Given 12/17/18 2359)  aspirin EC tablet 81 mg (has no administration in time range)  febuxostat (ULORIC) tablet 40 mg (has no administration in time range)  propranolol ER (INDERAL LA) 24 hr capsule 60 mg (has no administration in time range)  ALPRAZolam (XANAX) tablet 0.5 mg (has no administration in time range)  levothyroxine (SYNTHROID, LEVOTHROID) tablet 50 mcg (has no administration in time range)  pantoprazole (PROTONIX) EC tablet 40 mg (has no administration in time range)  gabapentin (NEURONTIN) capsule 300 mg (has no administration in time range)  fluticasone (FLOVENT HFA) 110 MCG/ACT inhaler 2 puff (has no administration in time range)  guaiFENesin (MUCINEX) 12 hr tablet 1,200 mg (has no administration in time range)  montelukast (SINGULAIR) tablet 10 mg (has no administration in time range)  olopatadine (PATANOL) 0.1 % ophthalmic solution 1 drop (has no administration in time range)  enoxaparin (LOVENOX) injection 40 mg (has no administration in time range)  benzonatate (TESSALON) capsule 100 mg (has no administration in time range)  cefTRIAXone (ROCEPHIN) 1 g in sodium chloride 0.9 % 100 mL IVPB (has no administration in time range)  azithromycin (ZITHROMAX) 500 mg in sodium chloride 0.9 % 250 mL IVPB (has no administration in time range)  ondansetron (ZOFRAN) injection 4 mg (has no administration in time range)  predniSONE (DELTASONE) tablet 40 mg (has no administration in time range)  ipratropium-albuterol (DUONEB) 0.5-2.5 (3) MG/3ML nebulizer solution 3 mL (has no administration in time range)  cefTRIAXone (ROCEPHIN) 1 g in sodium chloride 0.9 %  100 mL IVPB (0 g Intravenous Stopped 12/17/18 2352)  sodium chloride 0.9 % bolus 500 mL (0 mLs Intravenous Stopped 12/17/18 2352)  ipratropium-albuterol (DUONEB) 0.5-2.5 (3) MG/3ML nebulizer solution 3 mL (3 mLs Nebulization Given 12/17/18 2355)  azithromycin (ZITHROMAX) 500 mg in sodium chloride 0.9 % 250 mL IVPB (500 mg Intravenous New Bag/Given 12/17/18 2352)    Mobility walks
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
## 2018-12-18 NOTE — Progress Notes (Signed)
[facility] PROGRESS NOTE  Christina Mckinney XBD:[id number] DOB: [birth year] DOA: 12/17/2018  PCP: Mosie Lukes, MD  Brief History/Interval Summary: 85 year old woman with medical problems including hypertension, self-reported COPD, history of VTE in 2010 that was provoked no longer on anticoagulation, former smoker who presented with shortness of breath and productive cough.    Patient's x-ray did not show any infiltrates.  She was noted to be wheezing.  She was thought to have acute COPD exacerbation.  She was hospitalized for further management.    Reason for Visit: Acute COPD exacerbation  Consultants: None  Procedures: None  Antibiotics: Ceftriaxone and azithromycin  Subjective/Interval History: Patient states that she continues to have a cough with yellowish expectoration.  Continues to have some wheezing.  Denies any chest pain.  ROS: Some nausea but no vomiting.  Objective:  Vital Signs  Vitals:   12/18/18 0158 12/18/18 0219 12/18/18 0449 12/18/18 0744  BP:   (!) 113/51   Pulse:   91 93  Resp:    17  Temp:   99.2 F (37.3 C)   TempSrc:   Oral   SpO2: 95%  94% 98%  Weight:  74.8 kg    Height:  5\' 1"  (1.549 m)      Intake/Output Summary (Last 24 hours) at 12/18/2018 1126 Last data filed at 12/18/2018 0949 Gross per 24 hour  Intake 942 ml  Output 150 ml  Net 792 ml   Filed Weights   12/17/18 1841 12/18/18 0219  Weight: 74.8 kg 74.8 kg    General appearance: alert, cooperative, appears stated age and distracted Head: Normocephalic, without obvious abnormality, atraumatic Resp: Noted to be mildly tachypneic.  No use of accessory muscles.  End expiratory wheezing heard bilaterally.  Few crackles at the bases.  No rhonchi. Cardio: regular rate and rhythm, S1, S2 normal, no murmur, click, rub or gallop GI: soft, non-tender; bowel sounds normal; no masses,  no organomegaly Extremities: extremities normal, atraumatic, no cyanosis or edema Pulses: 2+ and  symmetric Neurologic: Alert and oriented x3.  Cranial nerves II through XII intact.  Motor strength equal bilateral upper and lower extremities.  Lab Results:  Data Reviewed: I have personally reviewed following labs and imaging studies  CBC: Recent Labs  Lab 12/17/18 2053 12/18/18 0156  WBC 11.4* 11.2*  HGB 11.8* 10.6*  HCT 36.5 33.4*  MCV 98.1 101.2*  PLT 239 834    Basic Metabolic Panel: Recent Labs  Lab 12/17/18 2053 12/18/18 0156  NA 136 136  K 4.9 3.7  CL 102 106  CO2 25 23  GLUCOSE 105* 185*  BUN 17 13  CREATININE 1.06* 0.99  CALCIUM 9.8 9.0    GFR: Estimated Creatinine Clearance: 39.1 mL/min (by C-G formula based on SCr of 0.99 mg/dL).  Liver Function Tests: Recent Labs  Lab 12/18/18 0156  AST 29  ALT 11  ALKPHOS 60  BILITOT 0.7  PROT 6.7  ALBUMIN 3.3*    Cardiac Enzymes: Recent Labs  Lab 12/17/18 2053  TROPONINI <0.03     Radiology Studies: Dg Chest 2 View  Result Date: 12/17/2018 CLINICAL DATA:  Cough with thick brown sputum and wheezing. Shortness of breath. EXAM: CHEST - 2 VIEW COMPARISON:  None. FINDINGS: The heart is enlarged. Mild vascular congestion. Small RIGHT pleural effusion. Healed sternal fracture. Degenerative change thoracic spine. Hyperinflation consistent with COPD. BILATERAL shoulder replacements. IMPRESSION: Cardiomegaly. Mild vascular congestion. COPD. Small RIGHT pleural effusion. Electronically Signed   By: Roderic Ovens.D.  On: 12/17/2018 17:41     Medications:  Scheduled: . aspirin EC  81 mg Oral Daily  . budesonide (PULMICORT) nebulizer solution  0.5 mg Nebulization BID  . ciprofloxacin  2 drop Both Eyes Q4H while awake  . enoxaparin (LOVENOX) injection  40 mg Subcutaneous Daily  . febuxostat  40 mg Oral Daily  . gabapentin  300 mg Oral QHS  . ipratropium-albuterol  3 mL Nebulization TID  . levothyroxine  50 mcg Oral QAC breakfast  . montelukast  10 mg Oral QHS  . olopatadine  1 drop Both Eyes BID  .  pantoprazole  40 mg Oral BID  . predniSONE  40 mg Oral Q breakfast  . propranolol ER  60 mg Oral Daily   Continuous: . azithromycin    . cefTRIAXone (ROCEPHIN)  IV     ENM:[id number], ALPRAZolam, benzonatate, guaiFENesin, ondansetron (ZOFRAN) IV    Assessment/Plan:  Sepsis likely due to respiratory source Patient had sepsis physiology at the time of admission with tachycardia tachypnea.  Patient was placed on antibiotics during ceftriaxone and azithromycin.  Appears to be stable.  Acute COPD exacerbation She is a former smoker.  Chest x-ray did not show any infiltrates.  Procalcitonin level 0.15.  WBC was mildly elevated 11.4.  She is afebrile this morning.  Continue with steroids nebulizer treatments and antibiotics.  Left ear otitis media Improved this morning.  Continue to monitor.  Essential hypertension Blood pressure is reasonably well controlled.  Continue current medication which includes propranolol.  Her ARB and furosemide on hold.  Hypothyroidism Continue with home medications.  History of gout Stable.  Macrocytic anemia Check anemia panel, B12 folate levels.  Check TSH and free T4.  DVT Prophylaxis: Lovenox    Code Status: Full code Family Communication: Discussed with the patient Disposition Plan: Mobilize as tolerated.  Continue treatment as outlined above.    LOS: 0 days   Dilan Novosad [employer] on [email or web address]  12/18/2018, 11:26 AM

## 2018-12-18 NOTE — Plan of Care (Signed)
Care plan initiated.

## 2018-12-18 NOTE — ED Provider Notes (Signed)
[place]   [id number] 12/17/18 Arrival Time: 0932  ASSESSMENT & PLAN:  1. Hypoxemia   2. SOB (shortness of breath)    I have personally viewed the imaging studies ordered this visit. No sign of pneumonia.  Meds ordered this encounter  Medications  . ipratropium-albuterol (DUONEB) 0.5-2.5 (3) MG/3ML nebulizer solution 3 mL  Mild help with work of breathing after duoneb here. Still remains significantly SOB with any exertion/ambulation. Sats 96-97% on 2L [place] at rest.  Discussed current symptoms. Recommend ED evaluation for hospital admission. She agrees. Declines EMS transport. Family member will drive her to the ED immediately. Reports she prefers [facility].  Stable upon discharge.  Reviewed expectations re: course of current medical issues. Questions answered. Outlined signs and symptoms indicating need for more acute intervention. Patient verbalized understanding. After Visit Summary given.   SUBJECTIVE: History from: patient.  Christina Mckinney is a 85 y.o. female who presents with complaint of nasal congestion, post-nasal drainage, and a persistent productive cough; without sore throat. Onset abrupt, over the past week; with fatigue and with body aches. SOB: significant when she walks, better at rest. Does not use O2 at home. Wheezing: she questions at times; worse at night. Uses CPAP and has noticed it is not giving her much benefit. Fever: subjective. Overall normal PO intake without n/v. Known sick contacts: no. No specific or significant aggravating or alleviating factors reported. OTC treatment: none reported.  Social History   Tobacco Use  Smoking Status Former Smoker  . Types: Cigarettes  . Last attempt to quit: 11/12/1988  . Years since quitting: 30.1  Smokeless Tobacco Never Used   ROS: As per HPI. All other systems negative.   OBJECTIVE:  Vitals:   12/17/18 1630  BP: (!) 172/90  Pulse: (!) 101  Resp: 18  Temp: 99 F (37.2 C)  TempSrc: Oral    SpO2: (!) 89%    O2 sats noted.  General appearance: alert; appears very fatigued HEENT: nasal congestion; clear runny nose; throat irritation secondary to post-nasal drainage Neck: supple without LAD CV: slight tachycardia; regular Lungs: unlabored respirations, symmetrical air entry without active wheezing; cough: marked, deep and wet sounding, productive of thick sputum Abd: soft; non-tender Ext: no LE edema Skin: warm and dry Psychological: alert and cooperative; normal mood and affect  Imaging: Dg Chest 2 View  Result Date: 12/17/2018 CLINICAL DATA:  Cough with thick brown sputum and wheezing. Shortness of breath. EXAM: CHEST - 2 VIEW COMPARISON:  None. FINDINGS: The heart is enlarged. Mild vascular congestion. Small RIGHT pleural effusion. Healed sternal fracture. Degenerative change thoracic spine. Hyperinflation consistent with COPD. BILATERAL shoulder replacements. IMPRESSION: Cardiomegaly. Mild vascular congestion. COPD. Small RIGHT pleural effusion. Electronically Signed   By: Staci Righter M.D.   On: 12/17/2018 17:41    Allergies  Allergen Reactions  . Simvastatin Other (See Comments)    Hip pain  . Crestor [Rosuvastatin]     Left hip pain  . Lipitor [Atorvastatin]     Pain in hips  . Red Dye     Hip pain  . Statins Other (See Comments)    Muscle aches, could not walk    Past Medical History:  Diagnosis Date  . Anemia, unspecified   . Anxiety and depression 09/18/2015  . Anxiety state, unspecified   . Chest pain   . Chronic systolic CHF (congestive heart failure) ([place]) 08/12/2015  . Depression with anxiety 06/02/2007   Qualifier: Diagnosis of  By: Wynona Luna   .  Depressive disorder, not elsewhere classified   . Diverticulitis   . DVT (deep venous thrombosis) ([place])   . Dyspnea 07/22/2015  . Edema 06/12/2014  . Essential and other specified forms of tremor   . GERD (gastroesophageal reflux disease)   . Gout, unspecified   . Internal hemorrhoids without  mention of complication   . Macular degeneration 08/07/2015  . Osteoarthritis   . Other abnormal glucose   . Other and unspecified hyperlipidemia   . Personal history of colonic polyps   . Personal history of peptic ulcer disease   . Pulmonary embolism ([place])   . Unspecified disorder of bladder   . Unspecified essential hypertension   . Unspecified hypothyroidism   . Varicose veins    Family History  Problem Relation Age of Onset  . Other Mother        varicose veins  . Hip fracture Mother   . Heart disease Father        CHF  . Hyperlipidemia Father   . Heart attack Father   . Diabetes Daughter   . Heart disease Daughter   . Hyperlipidemia Daughter   . Hypertension Daughter   . Aneurysm Daughter   . Cancer Sister        stomach  . Heart disease Sister   . Heart disease Brother   . Colon cancer Neg Hx    Social History   Socioeconomic History  . Marital status: Widowed    Spouse name: Not on file  . Number of children: 3  . Years of education: Not on file  . Highest education level: Not on file  Occupational History  . Occupation: Retired   Scientific laboratory technician  . Financial resource strain: Not on file  . Food insecurity:    Worry: Not on file    Inability: Not on file  . Transportation needs:    Medical: Not on file    Non-medical: Not on file  Tobacco Use  . Smoking status: Former Smoker    Types: Cigarettes    Last attempt to quit: 11/12/1988    Years since quitting: 30.1  . Smokeless tobacco: Never Used  Substance and Sexual Activity  . Alcohol use: Yes    Alcohol/week: 4.0 standard drinks    Types: 4 Standard drinks or equivalent per week    Comment: occasional wine   . Drug use: No  . Sexual activity: Never    Comment: lives alone, no dietary restrictions, widowed  Lifestyle  . Physical activity:    Days per week: Not on file    Minutes per session: Not on file  . Stress: Not on file  Relationships  . Social connections:    Talks on phone: Not on file      Gets together: Not on file    Attends religious service: Not on file    Active member of club or organization: Not on file    Attends meetings of clubs or organizations: Not on file    Relationship status: Not on file  . Intimate partner violence:    Fear of current or ex partner: Not on file    Emotionally abused: Not on file    Physically abused: Not on file    Forced sexual activity: Not on file  Other Topics Concern  . Not on file  Social History Narrative  . Not on file           Vanessa Kick, MD 12/18/18 [phone]

## 2018-12-19 DIAGNOSIS — F418 Other specified anxiety disorders: Secondary | ICD-10-CM | POA: Diagnosis present

## 2018-12-19 DIAGNOSIS — D649 Anemia, unspecified: Secondary | ICD-10-CM | POA: Diagnosis not present

## 2018-12-19 DIAGNOSIS — E039 Hypothyroidism, unspecified: Secondary | ICD-10-CM | POA: Diagnosis present

## 2018-12-19 DIAGNOSIS — Z86718 Personal history of other venous thrombosis and embolism: Secondary | ICD-10-CM | POA: Diagnosis not present

## 2018-12-19 DIAGNOSIS — J9601 Acute respiratory failure with hypoxia: Secondary | ICD-10-CM | POA: Diagnosis present

## 2018-12-19 DIAGNOSIS — D539 Nutritional anemia, unspecified: Secondary | ICD-10-CM | POA: Diagnosis present

## 2018-12-19 DIAGNOSIS — G4700 Insomnia, unspecified: Secondary | ICD-10-CM | POA: Diagnosis present

## 2018-12-19 DIAGNOSIS — N183 Chronic kidney disease, stage 3 (moderate): Secondary | ICD-10-CM | POA: Diagnosis present

## 2018-12-19 DIAGNOSIS — I5022 Chronic systolic (congestive) heart failure: Secondary | ICD-10-CM | POA: Diagnosis present

## 2018-12-19 DIAGNOSIS — A419 Sepsis, unspecified organism: Secondary | ICD-10-CM | POA: Diagnosis present

## 2018-12-19 DIAGNOSIS — G2581 Restless legs syndrome: Secondary | ICD-10-CM | POA: Diagnosis present

## 2018-12-19 DIAGNOSIS — Z90710 Acquired absence of both cervix and uterus: Secondary | ICD-10-CM | POA: Diagnosis not present

## 2018-12-19 DIAGNOSIS — M109 Gout, unspecified: Secondary | ICD-10-CM | POA: Diagnosis present

## 2018-12-19 DIAGNOSIS — Z86711 Personal history of pulmonary embolism: Secondary | ICD-10-CM | POA: Diagnosis not present

## 2018-12-19 DIAGNOSIS — Z9049 Acquired absence of other specified parts of digestive tract: Secondary | ICD-10-CM | POA: Diagnosis not present

## 2018-12-19 DIAGNOSIS — I130 Hypertensive heart and chronic kidney disease with heart failure and stage 1 through stage 4 chronic kidney disease, or unspecified chronic kidney disease: Secondary | ICD-10-CM | POA: Diagnosis present

## 2018-12-19 DIAGNOSIS — G4730 Sleep apnea, unspecified: Secondary | ICD-10-CM | POA: Diagnosis present

## 2018-12-19 DIAGNOSIS — E782 Mixed hyperlipidemia: Secondary | ICD-10-CM | POA: Diagnosis present

## 2018-12-19 DIAGNOSIS — H3530 Unspecified macular degeneration: Secondary | ICD-10-CM | POA: Diagnosis present

## 2018-12-19 DIAGNOSIS — J441 Chronic obstructive pulmonary disease with (acute) exacerbation: Secondary | ICD-10-CM | POA: Diagnosis present

## 2018-12-19 DIAGNOSIS — H66002 Acute suppurative otitis media without spontaneous rupture of ear drum, left ear: Secondary | ICD-10-CM | POA: Diagnosis present

## 2018-12-19 DIAGNOSIS — Z8711 Personal history of peptic ulcer disease: Secondary | ICD-10-CM | POA: Diagnosis not present

## 2018-12-19 DIAGNOSIS — J40 Bronchitis, not specified as acute or chronic: Secondary | ICD-10-CM | POA: Diagnosis present

## 2018-12-19 DIAGNOSIS — H1033 Unspecified acute conjunctivitis, bilateral: Secondary | ICD-10-CM | POA: Diagnosis present

## 2018-12-19 DIAGNOSIS — K219 Gastro-esophageal reflux disease without esophagitis: Secondary | ICD-10-CM | POA: Diagnosis present

## 2018-12-19 DIAGNOSIS — G629 Polyneuropathy, unspecified: Secondary | ICD-10-CM | POA: Diagnosis present

## 2018-12-19 LAB — CBC
HCT: 30.5 % — ABNORMAL LOW (ref 36.0–46.0)
Hemoglobin: 9.4 g/dL — ABNORMAL LOW (ref 12.0–15.0)
MCH: 32.1 pg (ref 26.0–34.0)
MCHC: 30.8 g/dL (ref 30.0–36.0)
MCV: 104.1 fL — ABNORMAL HIGH (ref 80.0–100.0)
NRBC: 0 % (ref 0.0–0.2)
Platelets: 223 10*3/uL (ref 150–400)
RBC: 2.93 MIL/uL — ABNORMAL LOW (ref 3.87–5.11)
RDW: 13.5 % (ref 11.5–15.5)
WBC: 9.6 10*3/uL (ref 4.0–10.5)

## 2018-12-19 LAB — BASIC METABOLIC PANEL
Anion gap: 5 (ref 5–15)
BUN: 16 mg/dL (ref 8–23)
CO2: 25 mmol/L (ref 22–32)
Calcium: 9.4 mg/dL (ref 8.9–10.3)
Chloride: 112 mmol/L — ABNORMAL HIGH (ref 98–111)
Creatinine, Ser: 1.05 mg/dL — ABNORMAL HIGH (ref 0.44–1.00)
GFR calc Af Amer: 56 mL/min — ABNORMAL LOW (ref >60)
GFR calc non Af Amer: 49 mL/min — ABNORMAL LOW (ref >60)
Glucose, Bld: 90 mg/dL (ref 70–99)
POTASSIUM: 3.7 mmol/L (ref 3.5–5.1)
Sodium: 142 mmol/L (ref 135–145)

## 2018-12-19 LAB — IRON AND TIBC
Iron: 26 ug/dL — ABNORMAL LOW (ref 28–170)
Saturation Ratios: 12 % (ref 10.4–31.8)
TIBC: 217 ug/dL — ABNORMAL LOW (ref 250–450)
UIBC: 191 ug/dL

## 2018-12-19 LAB — VITAMIN B12: Vitamin B-12: 765 pg/mL (ref 180–914)

## 2018-12-19 LAB — PROCALCITONIN: PROCALCITONIN: 0.11 ng/mL

## 2018-12-19 LAB — RETICULOCYTES
Immature Retic Fract: 22.7 % — ABNORMAL HIGH (ref 2.3–15.9)
RBC.: 2.93 MIL/uL — ABNORMAL LOW (ref 3.87–5.11)
Retic Count, Absolute: 58.9 10*3/uL (ref 19.0–186.0)
Retic Ct Pct: 2 % (ref 0.4–3.1)

## 2018-12-19 LAB — FOLATE: Folate: 25.8 ng/mL (ref >5.9)

## 2018-12-19 LAB — TSH: TSH: 3.327 u[IU]/mL (ref 0.350–4.500)

## 2018-12-19 LAB — T4, FREE: Free T4: 1.16 ng/dL (ref 0.82–1.77)

## 2018-12-19 LAB — FERRITIN: FERRITIN: 127 ng/mL (ref 11–307)

## 2018-12-19 MED ORDER — GUAIFENESIN 100 MG/5ML PO SOLN: 5.0000 mL | ORAL | Status: DC | PRN

## 2018-12-19 MED ORDER — AZITHROMYCIN 250 MG PO TABS
500.0000 mg | ORAL_TABLET | Freq: Every day | ORAL | Status: DC
  Administered 2018-12-19 – 2018-12-20 (×2): 500 mg via ORAL
  Filled 2018-12-19 (×2): qty 2

## 2018-12-19 MED ORDER — GUAIFENESIN ER 600 MG PO TB12
1200.0000 mg | ORAL_TABLET | Freq: Two times a day (BID) | ORAL | Status: DC
  Administered 2018-12-19 – 2018-12-21 (×4): 1200 mg via ORAL
  Filled 2018-12-19 (×4): qty 2

## 2018-12-19 MED ORDER — LIP MEDEX EX OINT
TOPICAL_OINTMENT | CUTANEOUS | Status: AC
  Administered 2018-12-19: 1
  Filled 2018-12-19: qty 7

## 2018-12-19 NOTE — Progress Notes (Signed)
[facility] PROGRESS NOTE  Christina Mckinney UYQ:[id number] DOB: 03-17-1932 DOA: 12/17/2018  PCP: Mosie Lukes, MD  Brief History/Interval Summary: 85 year old woman with medical problems including hypertension, self-reported COPD, history of VTE in 2010 that was provoked no longer on anticoagulation, former smoker who presented with shortness of breath and productive cough.    Patient's x-ray did not show any infiltrates.  She was noted to be wheezing.  She was thought to have acute COPD exacerbation.  She was hospitalized for further management.    Reason for Visit: Acute COPD exacerbation  Consultants: None  Procedures: None  Antibiotics: Ceftriaxone and azithromycin  Subjective/Interval History: Patient states that she is getting short of breath even with minimal exertion.  Continues to have cough with yellowish expectoration.  Denies any chest pain.    ROS: Some nausea but no vomiting.  Objective:  Vital Signs  Vitals:   12/18/18 2233 12/19/18 0603 12/19/18 0905 12/19/18 1015  BP: 136/80 111/86  (!) 147/67  Pulse: 86 76  83  Resp: 17 15    Temp: 98.2 F (36.8 C) 98.5 F (36.9 C)    TempSrc: Oral Oral    SpO2: 97% 99% 97%   Weight:      Height:        Intake/Output Summary (Last 24 hours) at 12/19/2018 1040 Last data filed at 12/19/2018 0900 Gross per 24 hour  Intake 1095.32 ml  Output 400 ml  Net 695.32 ml   Filed Weights   12/17/18 1841 12/18/18 0219  Weight: 74.8 kg 74.8 kg   General appearance: Awake alert.  In no distress Resp: Noted to be tachypneic.  No use of accessory muscles.  Coarse breath sounds bilaterally.  Crackles at the right base.  No rhonchi.  Few wheezes heard bilaterally.   Cardio: S1-S2 is normal regular.  No S3-S4.  No rubs murmurs or bruit GI: Abdomen is soft.  Nontender nondistended.  Bowel sounds are present normal.  No masses organomegaly Extremities: No edema.  Full range of motion of lower extremities. Neurologic: Alert and  oriented x3.  No focal neurological deficits.    Lab Results:  Data Reviewed: I have personally reviewed following labs and imaging studies  CBC: Recent Labs  Lab 12/17/18 2053 12/18/18 0156 12/19/18 0516  WBC 11.4* 11.2* 9.6  HGB 11.8* 10.6* 9.4*  HCT 36.5 33.4* 30.5*  MCV 98.1 101.2* 104.1*  PLT 239 214 638    Basic Metabolic Panel: Recent Labs  Lab 12/17/18 2053 12/18/18 0156 12/19/18 0516  NA 136 136 142  K 4.9 3.7 3.7  CL 102 106 112*  CO2 25 23 25   GLUCOSE 105* 185* 90  BUN 17 13 16   CREATININE 1.06* 0.99 1.05*  CALCIUM 9.8 9.0 9.4    GFR: Estimated Creatinine Clearance: 36.9 mL/min (A) (by C-G formula based on SCr of 1.05 mg/dL (H)).  Liver Function Tests: Recent Labs  Lab 12/18/18 0156  AST 29  ALT 11  ALKPHOS 60  BILITOT 0.7  PROT 6.7  ALBUMIN 3.3*    Cardiac Enzymes: Recent Labs  Lab 12/17/18 2053  TROPONINI <0.03     Radiology Studies: Dg Chest 2 View  Result Date: 12/17/2018 CLINICAL DATA:  Cough with thick brown sputum and wheezing. Shortness of breath. EXAM: CHEST - 2 VIEW COMPARISON:  None. FINDINGS: The heart is enlarged. Mild vascular congestion. Small RIGHT pleural effusion. Healed sternal fracture. Degenerative change thoracic spine. Hyperinflation consistent with COPD. BILATERAL shoulder replacements. IMPRESSION: Cardiomegaly. Mild  vascular congestion. COPD. Small RIGHT pleural effusion. Electronically Signed   By: Staci Righter M.D.   On: 12/17/2018 17:41     Medications:  Scheduled: . aspirin EC  81 mg Oral Daily  . budesonide (PULMICORT) nebulizer solution  0.5 mg Nebulization BID  . ciprofloxacin  2 drop Both Eyes Q4H while awake  . enoxaparin (LOVENOX) injection  40 mg Subcutaneous Daily  . febuxostat  40 mg Oral Daily  . gabapentin  300 mg Oral QHS  . ipratropium-albuterol  3 mL Nebulization BID  . levothyroxine  50 mcg Oral QAC breakfast  . montelukast  10 mg Oral QHS  . olopatadine  1 drop Both Eyes BID  .  pantoprazole  40 mg Oral BID  . predniSONE  40 mg Oral Q breakfast  . propranolol ER  60 mg Oral Daily   Continuous: . sodium chloride Stopped (12/18/18 2148)  . azithromycin Stopped (12/18/18 2127)  . cefTRIAXone (ROCEPHIN)  IV Stopped (12/18/18 1825)   UVO:ZDGUYQ chloride, albuterol, ALPRAZolam, benzonatate, guaiFENesin, ondansetron (ZOFRAN) IV    Assessment/Plan:  Sepsis likely due to respiratory source Patient had sepsis physiology at the time of admission with tachycardia tachypnea.  Patient was placed on antibiotics during ceftriaxone and azithromycin.  Sepsis physiology appears to have improved.  She is afebrile.  Heart rate has improved.  Acute respiratory failure with hypoxia Patient's oxygen saturations were noted to be in the 80s while she was ambulating.  She is noted to be dyspneic this morning.  Continue oxygen by nasal cannula for now.  Wean down as tolerated.  Acute COPD exacerbation She is a former smoker.  Chest x-ray did not show any infiltrates.  Procalcitonin level 0.15.  WBC was mildly elevated 11.4.  Patient remains dyspneic.  Continue nebulizer treatments, steroids, antibiotics.  Add Mucinex.    Left ear otitis media Has some drainage from the left ear.  Continue antibiotics as mentioned above.  Essential hypertension Blood pressure is reasonably well controlled.  Continue current medication which includes propranolol.  Her ARB and furosemide on hold.  Hypothyroidism Continue with home medications.  TSH and Free T4 normal.  History of gout Stable.  Macrocytic anemia Drop in hemoglobin is likely dilutional.  Anemia panel reviewed.  Ferritin 127.  TIBC 217.  Folate 25.8.  B12 765.    DVT Prophylaxis: Lovenox    Code Status: Full code Family Communication: Discussed with the patient Disposition Plan: Patient continues to be dyspneic.  She became hypoxic yesterday while ambulating.  She continues to require intensive treatment in the hospital.    LOS:  0 days   [facility] Pager on [email or web address]  12/19/2018, 10:40 AM

## 2018-12-19 NOTE — Progress Notes (Signed)
PHARMACIST - PHYSICIAN COMMUNICATION  CONCERNING: Antibiotic IV to Oral Route Change Policy  RECOMMENDATION: This patient is receiving azithromycin by the intravenous route.  Based on criteria approved by the Pharmacy and Therapeutics Committee, the antibiotic(s) is/are being converted to the equivalent oral dose form(s).   DESCRIPTION: These criteria include:  Patient being treated for a respiratory tract infection, urinary tract infection, cellulitis or clostridium difficile associated diarrhea if on metronidazole  The patient is not neutropenic and does not exhibit a GI malabsorption state  The patient is eating (either orally or via tube) and/or has been taking other orally administered medications for a least 24 hours  The patient is improving clinically and has a Tmax < 100.5  If you have questions about this conversion, please contact the Pharmacy Department  []   [phone] )  [facility] []   [phone] )  [facility] []   [phone] )  [facility] []   [phone] )  [facility] [x]   [phone] )  [place], [place].D [phone] 12/19/2018 6:56 PM

## 2018-12-19 NOTE — Progress Notes (Signed)
Physical Therapy Treatment Patient Details Name: Christina Mckinney MRN: [id number] DOB: 12-20-1931 Today's Date: 12/19/2018    History of Present Illness 85 yo female admitted to ED on 2/5 with sepsis secondary to PNA, COPD exacerbation. PMH includes anemia, anxiety, depression, chest pain, CHF, DVT and PE, edema, macular degeneration, gout.     PT Comments    Pt with improved ambulation distance and improved SpO2 during ambulation. Pt still requiring intermittent supplemental O2 to maintain sats >90%, as pt with tendency to drop to high 80's with symptomatic dyspnea and has difficulty recovering without supplemental O2. Pt with some unsteadiness noted when PT challenged her gait, see DGI items. PT continuing to recommend OPPT to address deficits and to decrease pt fall risk.    Follow Up Recommendations  Outpatient PT     Equipment Recommendations  None recommended by PT    Recommendations for Other Services       Precautions / Restrictions Precautions Precautions: Fall Precaution Comments: droplet - mask applied for ambulation out of room; watch sats  Restrictions Weight Bearing Restrictions: No    Mobility  Bed Mobility               General bed mobility comments: Pt up in chair upon arrival to room, and left in chair after PT session.   Transfers Overall transfer level: Needs assistance Equipment used: None Transfers: Sit to/from Stand Sit to Stand: Modified independent (Device/Increase time)         General transfer comment: Use of armrests to stand, increased time to steady once standing. Sats 88-90% on RA once standing. Monitored closely.   Ambulation/Gait Ambulation/Gait assistance: Supervision;Min guard Gait Distance (Feet): 700 Feet Assistive device: None Gait Pattern/deviations: Step-through pattern;Decreased stride length;Trunk flexed Gait velocity: normal    General Gait Details: supervision to min guard for safety. Pt with forward leaning with  ambulation, verbal cuing for posture. Pt ambulated around unit x2, requiring intermittent standing rest breaks x3 to recover dyspnea 2/4. Pt with SpO2 mostly at 90% on RA, but once fatigued and dyspneic pt satting at 87% without ability to recover to >90% on RA. Pt placed on 2LO2 to recover sats, and required it intermittently during ambulation and during recovery from gait.    Stairs             Wheelchair Mobility    Modified Rankin (Stroke Patients Only)       Balance Overall balance assessment: Needs assistance Sitting-balance support: No upper extremity supported;Feet supported Sitting balance-Leahy Scale: Good     Standing balance support: No upper extremity supported Standing balance-Leahy Scale: Fair Standing balance comment: cannot accept challenges to gait                  Standardized Balance Assessment Standardized Balance Assessment : Dynamic Gait Index   Dynamic Gait Index Level Surface: Normal Change in Gait Speed: Mild Impairment Gait with Horizontal Head Turns: Normal Gait with Vertical Head Turns: Normal Gait and Pivot Turn: Mild Impairment Step Over Obstacle: Mild Impairment Step Around Obstacles: Mild Impairment Steps: (Did not attempt steps, assuming mild impairment for score of 19 )      Cognition Arousal/Alertness: Awake/alert Behavior During Therapy: WFL for tasks assessed/performed Overall Cognitive Status: Within Functional Limits for tasks assessed  Exercises General Exercises - Lower Extremity Ankle Circles/Pumps: AROM;10 reps;Both;Seated Long Arc Quad: AROM;10 reps;Seated Mini-Sqauts: AROM;Both;10 reps;Seated;Standing;Other (comment)(Sit to stand; difficulty with APA and imbalance)    General Comments General comments (skin integrity, edema, etc.): No dizziness or vertigo noted.       Pertinent Vitals/Pain Pain Assessment: 0-10 Pain Score: 0-No pain Pain  Intervention(s): Monitored during session    Home Living                      Prior Function            PT Goals (current goals can now be found in the care plan section) Acute Rehab PT Goals Patient Stated Goal: feel better, get stronger  PT Goal Formulation: With patient Time For Goal Achievement: 01/01/19 Potential to Achieve Goals: Good Progress towards PT goals: Progressing toward goals    Frequency    Min 3X/week      PT Plan Current plan remains appropriate    Co-evaluation              AM-PAC PT "6 Clicks" Mobility   Outcome Measure  Help needed turning from your back to your side while in a flat bed without using bedrails?: A Little Help needed moving from lying on your back to sitting on the side of a flat bed without using bedrails?: A Little Help needed moving to and from a bed to a chair (including a wheelchair)?: None Help needed standing up from a chair using your arms (e.g., wheelchair or bedside chair)?: None Help needed to walk in hospital room?: A Little Help needed climbing 3-5 steps with a railing? : A Little 6 Click Score: 20    End of Session Equipment Utilized During Treatment: Gait belt;Oxygen Activity Tolerance: Patient limited by fatigue Patient left: with call bell/phone within reach;in chair Nurse Communication: Mobility status PT Visit Diagnosis: Other abnormalities of gait and mobility (R26.89);Difficulty in walking, not elsewhere classified (R26.2)     Time: 3818-2993 PT Time Calculation (min) (ACUTE ONLY): 26 min  Charges:  $Gait Training: 23-37 mins                     Julien Girt, PT Acute Rehabilitation Services Pager [phone]  Office [phone]    Christina Mckinney 12/19/2018, 12:51 PM

## 2018-12-20 DIAGNOSIS — N183 Chronic kidney disease, stage 3 (moderate): Secondary | ICD-10-CM

## 2018-12-20 DIAGNOSIS — H66002 Acute suppurative otitis media without spontaneous rupture of ear drum, left ear: Secondary | ICD-10-CM

## 2018-12-20 DIAGNOSIS — J40 Bronchitis, not specified as acute or chronic: Secondary | ICD-10-CM

## 2018-12-20 LAB — CBC
HCT: 30 % — ABNORMAL LOW (ref 36.0–46.0)
HEMOGLOBIN: 9.1 g/dL — AB (ref 12.0–15.0)
MCH: 30.8 pg (ref 26.0–34.0)
MCHC: 30.3 g/dL (ref 30.0–36.0)
MCV: 101.7 fL — ABNORMAL HIGH (ref 80.0–100.0)
Platelets: 257 10*3/uL (ref 150–400)
RBC: 2.95 MIL/uL — AB (ref 3.87–5.11)
RDW: 13.2 % (ref 11.5–15.5)
WBC: 11.2 10*3/uL — ABNORMAL HIGH (ref 4.0–10.5)
nRBC: 0 % (ref 0.0–0.2)

## 2018-12-20 LAB — BASIC METABOLIC PANEL
Anion gap: 7 (ref 5–15)
BUN: 22 mg/dL (ref 8–23)
CHLORIDE: 108 mmol/L (ref 98–111)
CO2: 25 mmol/L (ref 22–32)
Calcium: 9.7 mg/dL (ref 8.9–10.3)
Creatinine, Ser: 1.24 mg/dL — ABNORMAL HIGH (ref 0.44–1.00)
GFR calc Af Amer: 46 mL/min — ABNORMAL LOW (ref >60)
GFR calc non Af Amer: 40 mL/min — ABNORMAL LOW (ref >60)
Glucose, Bld: 120 mg/dL — ABNORMAL HIGH (ref 70–99)
POTASSIUM: 4.3 mmol/L (ref 3.5–5.1)
Sodium: 140 mmol/L (ref 135–145)

## 2018-12-20 MED ORDER — CEFDINIR 300 MG PO CAPS
300.0000 mg | ORAL_CAPSULE | Freq: Two times a day (BID) | ORAL | Status: DC
  Administered 2018-12-20 – 2018-12-21 (×3): 300 mg via ORAL
  Filled 2018-12-20 (×3): qty 1

## 2018-12-20 MED ORDER — BACITRACIN-NEOMYCIN-POLYMYXIN OINTMENT TUBE
TOPICAL_OINTMENT | CUTANEOUS | Status: DC | PRN
  Administered 2018-12-20: 1 via TOPICAL
  Filled 2018-12-20: qty 14.17

## 2018-12-20 NOTE — Progress Notes (Signed)
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    Patient's x-ray did not show any infiltrates.  She was noted to be wheezing.  She was thought to have acute COPD exacerbation.  She was hospitalized for further management.    Reason for Visit: Acute COPD exacerbation  Consultants: None  Procedures: None  Antibiotics: Ceftriaxone and azithromycin  Subjective/Interval History: Patient states that she is feeling better however continues to have a cough.  Instead of yellow sputum now she has grayish colored sputum.  Denies any chest pain.     ROS: No nausea or vomiting  Objective:  Vital Signs  Vitals:   12/19/18 2023 12/19/18 2028 12/20/18 0517 12/20/18 0615  BP: 134/60  (!) 125/48   Pulse: 76  72   Resp: 18  17   Temp: 98 F (36.7 C)  98.1 F (36.7 C)   TempSrc: Oral  Oral   SpO2: 96% 94% 94% 93%  Weight:      Height:        Intake/Output Summary (Last 24 hours) at 12/20/2018 1141 Last data filed at 12/20/2018 0723 Gross per 24 hour  Intake 1072.99 ml  Output 850 ml  Net 222.99 ml   Filed Weights   12/17/18 1841 12/18/18 0219  Weight: 74.8 kg 74.8 kg   General appearance: Awake alert.  In no distress Resp: Less tachypneic today compared to yesterday.  Coarse breath sounds bilaterally.  Few crackles at the bases.  No rhonchi.  Few wheezes heard bilaterally.   Cardio: S1-S2 is normal regular.  No S3-S4.  No rubs murmurs or bruit GI: Abdomen is soft.  Nontender nondistended.  Bowel sounds are present normal.  No masses organomegaly Extremities: No edema.  Full range of motion of lower extremities. Neurologic: Alert and oriented x3.  No  focal neurological deficits.   Lab Results:  Data Reviewed: I have personally reviewed following labs and imaging studies  CBC: Recent Labs  Lab 12/17/18 2053 12/18/18 0156 12/19/18 0516 12/20/18 0409  WBC 11.4* 11.2* 9.6 11.2*  HGB 11.8* 10.6* 9.4* 9.1*  HCT 36.5 33.4* 30.5* 30.0*  MCV 98.1 101.2* 104.1* 101.7*  PLT 239 214 223 354    Basic Metabolic Panel: Recent Labs  Lab 12/17/18 2053 12/18/18 0156 12/19/18 0516 12/20/18 0409  NA 136 136 142 140  K 4.9 3.7 3.7 4.3  CL 102 106 112* 108  CO2 25 23 25 25   GLUCOSE 105* 185* 90 120*  BUN 17 13 16 22   CREATININE 1.06* 0.99 1.05* 1.24*  CALCIUM 9.8 9.0 9.4 9.7    GFR: Estimated Creatinine Clearance: 31.2 mL/min (A) (by C-G formula based on SCr of 1.24 mg/dL (H)).  Liver Function Tests: Recent Labs  Lab 12/18/18 0156  AST 29  ALT 11  ALKPHOS 60  BILITOT 0.7  PROT 6.7  ALBUMIN 3.3*    Cardiac Enzymes: Recent Labs  Lab 12/17/18 2053  TROPONINI <0.03     Radiology Studies: No results found.   Medications:  Scheduled: . aspirin EC  81 mg Oral Daily  . azithromycin  500 mg Oral q1800  . budesonide (PULMICORT) nebulizer solution  0.5 mg Nebulization BID  . ciprofloxacin  2 drop Both Eyes  Q4H while awake  . enoxaparin (LOVENOX) injection  40 mg Subcutaneous Daily  . febuxostat  40 mg Oral Daily  . gabapentin  300 mg Oral QHS  . guaiFENesin  1,200 mg Oral BID  . ipratropium-albuterol  3 mL Nebulization BID  . levothyroxine  50 mcg Oral QAC breakfast  . montelukast  10 mg Oral QHS  . olopatadine  1 drop Both Eyes BID  . pantoprazole  40 mg Oral BID  . predniSONE  40 mg Oral Q breakfast  . propranolol ER  60 mg Oral Daily   Continuous: . sodium chloride Stopped (12/19/18 2352)  . cefTRIAXone (ROCEPHIN)  IV Stopped (12/19/18 2143)   ZGY:FVCBSW chloride, albuterol, ALPRAZolam, guaiFENesin, neomycin-bacitracin-polymyxin, ondansetron (ZOFRAN) IV    Assessment/Plan:  Sepsis likely due to  respiratory source Patient had sepsis physiology at the time of admission with tachycardia tachypnea.  Patient was placed on antibiotics during ceftriaxone and azithromycin.  Sepsis physiology has resolved.  Patient remains afebrile.  Heart rate is improved.  Change to oral antibiotics.  Acute respiratory failure with hypoxia Patient's oxygen saturations were noted to be in the 80s while she was ambulating.  Pain slightly better today.  Did better with physical therapy yesterday compared to the day before.  Currently on room air.  Continue to monitor.   Acute COPD exacerbation She is a former smoker.  Chest x-ray did not show any infiltrates.  Procalcitonin level 0.15.  WBC was mildly elevated 11.4.  Patient appears to be improving with nebulizer treatment, steroids and antibiotics.  Continue Mucinex.   Left ear otitis media Has some drainage from the left ear.  Continue antibiotics as mentioned above.  Essential hypertension Blood pressure is reasonably well controlled.  Continue current medication which includes propranolol.  Her ARB and furosemide on hold.  Chronic kidney disease stage III Bump in creatinine noted.  Old labs reviewed.  This could be her baseline.  Monitor urine output.  Recheck labs tomorrow.  Hypothyroidism Continue with home medications.  TSH and Free T4 normal.  History of gout Stable.  Macrocytic anemia Drop in hemoglobin is likely dilutional.  Anemia panel reviewed.  Ferritin 127.  TIBC 217.  Folate 25.8.  B12 765.  Hemoglobin remains stable.  DVT Prophylaxis: Lovenox    Code Status: Full code Family Communication: Discussed with the patient Disposition Plan: Patient seems to be slightly improved this morning.  Change to oral antibiotics today.  Possible discharge in 24 to 48 hours.      LOS: 1 day   Christina Mckinney [employer] on [email or web address]  12/20/2018, 11:41 AM

## 2018-12-20 NOTE — Plan of Care (Signed)
Pt overall feeling better with cough and congestion. No needs at this time. No s/s of distress. Pt denies pain at this time. Pt does remain weak overall.

## 2018-12-21 LAB — BASIC METABOLIC PANEL
Anion gap: 9 (ref 5–15)
BUN: 27 mg/dL — ABNORMAL HIGH (ref 8–23)
CO2: 22 mmol/L (ref 22–32)
Calcium: 9.8 mg/dL (ref 8.9–10.3)
Chloride: 109 mmol/L (ref 98–111)
Creatinine, Ser: 1 mg/dL (ref 0.44–1.00)
GFR calc Af Amer: 60 mL/min — ABNORMAL LOW (ref >60)
GFR calc non Af Amer: 52 mL/min — ABNORMAL LOW (ref >60)
GLUCOSE: 84 mg/dL (ref 70–99)
Potassium: 3.5 mmol/L (ref 3.5–5.1)
Sodium: 140 mmol/L (ref 135–145)

## 2018-12-21 MED ORDER — PREDNISONE 20 MG PO TABS: ORAL_TABLET | ORAL | 0 refills | Status: DC

## 2018-12-21 MED ORDER — IPRATROPIUM-ALBUTEROL 0.5-2.5 (3) MG/3ML IN SOLN: RESPIRATORY_TRACT | 0 refills | Status: DC

## 2018-12-21 MED ORDER — CEFDINIR 300 MG PO CAPS: 300.0000 mg | ORAL_CAPSULE | Freq: Two times a day (BID) | ORAL | 0 refills | Status: AC

## 2018-12-21 MED ORDER — CIPROFLOXACIN HCL 0.3 % OP SOLN: OPHTHALMIC | 0 refills | Status: DC

## 2018-12-21 MED ORDER — AZITHROMYCIN 250 MG PO TABS
500.0000 mg | ORAL_TABLET | Freq: Every day | ORAL | Status: AC
  Administered 2018-12-21: 500 mg via ORAL
  Filled 2018-12-21: qty 2

## 2018-12-21 MED ORDER — CIPROFLOXACIN HCL 0.3 % OP SOLN: 2.0000 [drp] | OPHTHALMIC | 0 refills | Status: DC

## 2018-12-21 NOTE — Progress Notes (Signed)
Pt did well with ambulation in hall. No shortness of breath with did have expiratory wheezes with ambulation (she had these at rest too). Pt 02 sat was 96 on room air with ambulation in hall. Case worker notified of need for home neb. She will get this prior to d/c. Md notified of pt condition with ambulation.

## 2018-12-21 NOTE — Discharge Summary (Signed)
[facility]  Physician Discharge Summary   Patient ID: VICTORINA KABLE MRN: [id number] DOB/AGE: 1931-11-29 85 y.o.  Admit date: 12/17/2018 Discharge date: 12/21/2018  PCP: Mosie Lukes, MD  DISCHARGE DIAGNOSES:  Sepsis likely secondary to respiratory source, resolved Acute respiratory failure with hypoxia, resolved Acute COPD exacerbation, resolved Left ear otitis media Essential hypertension Chronic kidney disease stage III Thyroidism History of gout Macrocytic anemia  RECOMMENDATIONS FOR OUTPATIENT FOLLOW UP: 1. Outpatient follow-up with PCP in 1 week 2. 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   Patient's x-ray did not show any infiltrates.  She was noted to be wheezing.  She was thought to have acute COPD exacerbation.  She was hospitalized for further management.     HOSPITAL COURSE:   Sepsis likely due to respiratory source Patient had sepsis physiology at the time of admission with tachycardia tachypnea.  Patient was placed on antibiotics during ceftriaxone and azithromycin.  Sepsis physiology has resolved.  Patient remains afebrile.  Heart rate is improved.  Changed to oral antibiotics.  Acute respiratory failure with hypoxia Patient's oxygen saturations were initially noted to be in the 80s while she was ambulating.   Has improved with treatment.  Currently saturating normal on room air.  Acute COPD exacerbation She is a former smoker.  Chest x-ray did not show any infiltrates.  Procalcitonin level 0.15.  WBC was mildly elevated 11.4.  Patient appears to  have improved with nebulizer treatment, steroids and antibiotics.  Continue Mucinex.  Outpatient follow-up with PCP.  She will be given nebulizer machine.  Left ear otitis media Has some drainage from the left ear.  Continue antibiotics as mentioned above.  Symptoms have improved.  Essential hypertension Continue home medications.  Chronic kidney disease stage III Renal function is stable.   Hypothyroidism Continue with home medications.  TSH and Free T4 normal.  History of gout Stable.  Macrocytic anemia Drop in hemoglobin is likely dilutional.  Anemia panel reviewed.  Ferritin 127.  TIBC 217.  Folate 25.8.  B12 765.  Hemoglobin remains stable.  Overall stable.  Patient ambulated this morning, feels well and wishes to go home.  Okay for discharge home today.   PERTINENT LABS:  The results of significant diagnostics from this hospitalization (including imaging, microbiology, ancillary and laboratory) are listed below for reference.     Labs: Basic Metabolic Panel: Recent Labs  Lab 12/17/18 2053 12/18/18 0156 12/19/18 0516 12/20/18 0409 12/21/18 0443  NA 136 136 142 140 140  K 4.9 3.7 3.7 4.3 3.5  CL 102 106 112* 108 109  CO2 25 23 25 25 22   GLUCOSE 105* 185* 90 120* 84  BUN 17 13 16 22  27*  CREATININE 1.06* 0.99 1.05* 1.24* 1.00  CALCIUM 9.8 9.0 9.4 9.7 9.8   Liver Function Tests: Recent Labs  Lab 12/18/18 0156  AST 29  ALT 11  ALKPHOS 60  BILITOT 0.7  PROT 6.7  ALBUMIN 3.3*   CBC: Recent Labs  Lab 12/17/18 2053 12/18/18 0156 12/19/18 0516 12/20/18 0409  WBC 11.4* 11.2* 9.6 11.2*  HGB 11.8* 10.6* 9.4* 9.1*  HCT 36.5 33.4* 30.5* 30.0*  MCV  98.1 101.2* 104.1* 101.7*  PLT 239 214 223 257   Cardiac Enzymes: Recent Labs  Lab 12/17/18 2053  TROPONINI <0.03    IMAGING STUDIES Dg Chest 2 View  Result Date: 12/17/2018 CLINICAL DATA:  Cough with thick brown sputum and wheezing. Shortness of breath. EXAM: CHEST - 2 VIEW COMPARISON:  None.  FINDINGS: The heart is enlarged. Mild vascular congestion. Small RIGHT pleural effusion. Healed sternal fracture. Degenerative change thoracic spine. Hyperinflation consistent with COPD. BILATERAL shoulder replacements. IMPRESSION: Cardiomegaly. Mild vascular congestion. COPD. Small RIGHT pleural effusion. Electronically Signed   By: Staci Righter M.D.   On: 12/17/2018 17:41    DISCHARGE EXAMINATION: Vitals:   12/21/18 0517 12/21/18 1005 12/21/18 1011 12/21/18 1026  BP: (!) 166/67     Pulse: 71     Resp:      Temp: 98 F (36.7 C)     TempSrc: Oral     SpO2: 96% 95% 96% 96%  Weight:      Height:       General appearance: alert, cooperative, appears stated age and no distress Resp: clear to auscultation bilaterally Cardio: regular rate and rhythm, S1, S2 normal, no murmur, click, rub or gallop GI: soft, non-tender; bowel sounds normal; no masses,  no organomegaly  DISPOSITION: Home  Discharge Instructions    Ambulatory referral to Physical Therapy   Complete by:  As directed    Call MD for:  difficulty breathing, headache or visual disturbances   Complete by:  As directed    Call MD for:  extreme fatigue   Complete by:  As directed    Call MD for:  persistant dizziness or light-headedness   Complete by:  As directed    Call MD for:  persistant nausea and vomiting   Complete by:  As directed    Call MD for:  severe uncontrolled pain   Complete by:  As directed    Call MD for:  temperature >100.4   Complete by:  As directed    Discharge instructions   Complete by:  As directed    Please be sure to follow-up with your primary care provider within 1 week.  Take your medications as prescribed.  You were cared for by a hospitalist during your hospital stay. If you have any questions about your discharge medications or the care you received while you were in the hospital after you are discharged, you can call the unit and asked to speak with the hospitalist on call if the hospitalist  that took care of you is not available. Once you are discharged, your primary care physician will handle any further medical issues. Please note that NO REFILLS for any discharge medications will be authorized once you are discharged, as it is imperative that you return to your primary care physician (or establish a relationship with a primary care physician if you do not have one) for your aftercare needs so that they can reassess your need for medications and monitor your lab values. If you do not have a primary care physician, you can call [phone] for a physician referral.   Increase activity slowly   Complete by:  As directed         Allergies as of 12/21/2018      Reactions   Simvastatin Other (See Comments)   Hip pain   Crestor [rosuvastatin]    Left hip pain   Lipitor [atorvastatin]    Pain in hips   Red Dye    Hip  pain   Statins Other (See Comments)   Muscle aches, could not walk      Medication List    TAKE these medications   ALPRAZolam 0.5 MG tablet Commonly known as:  XANAX Take 1 tablet (0.5 mg total) by mouth at bedtime as needed for anxiety.   aspirin 81 MG tablet Take 1 tablet (81 mg total) by mouth daily.   cefdinir 300 MG capsule Commonly known as:  OMNICEF Take 1 capsule (300 mg total) by mouth every 12 (twelve) hours for 3 days.   ciprofloxacin 0.3 % ophthalmic solution Commonly known as:  CILOXAN 1 drop to both eyes every 4 hours, while awake, for 5 days.   co-enzyme Q-10 30 MG capsule Take 30 mg by mouth daily.   famotidine 40 MG tablet Commonly known as:  PEPCID Take 1 tablet (40 mg total) by mouth daily.   fenofibrate micronized 134 MG capsule Commonly known as:  LOFIBRA TAKE 1 CAPSULE DAILY   fexofenadine 180 MG tablet Commonly known as:  ALLEGRA ALLERGY Take 1 tablet (180 mg total) by mouth daily.   fluticasone 110 MCG/ACT inhaler Commonly known as:  FLOVENT HFA Inhale 2 puffs into the lungs 2 (two) times daily.   fluticasone 50  MCG/ACT nasal spray Commonly known as:  FLONASE USE 2 SPRAYS IN EACH NOSTRIL DAILY AS NEEDED FOR ALLERGIES OR RHINITIS What changed:  See the new instructions.   furosemide 40 MG tablet Commonly known as:  LASIX TAKE 1 TABLET DAILY AS DIRECTED   gabapentin 300 MG capsule Commonly known as:  NEURONTIN Take 1 capsule (300 mg total) by mouth at bedtime.   ipratropium-albuterol 0.5-2.5 (3) MG/3ML Soln Commonly known as:  DUONEB Take 3 times a day for next 3 days and then as needed for wheezing.   levothyroxine 50 MCG tablet Commonly known as:  SYNTHROID, LEVOTHROID TAKE 1 TABLET DAILY BEFORE BREAKFAST   losartan 50 MG tablet Commonly known as:  COZAAR TAKE 1 TABLET DAILY   montelukast 10 MG tablet Commonly known as:  SINGULAIR TAKE 1 TABLET AT BEDTIME   MUCINEX 600 MG 12 hr tablet Generic drug:  guaiFENesin Take 1,200 mg by mouth 2 (two) times daily as needed for to loosen phlegm.   pantoprazole 40 MG tablet Commonly known as:  PROTONIX Take 1 tablet (40 mg total) by mouth 2 (two) times daily.   PATADAY 0.2 % Soln Generic drug:  Olopatadine HCl Place 1 drop into both eyes daily.   predniSONE 20 MG tablet Commonly known as:  DELTASONE Take 2 tablets once daily for 4 days, then take 1 tablet once daily for 4 days, then STOP.   PROAIR HFA 108 (90 Base) MCG/ACT inhaler Generic drug:  albuterol USE 2 INHALATIONS EVERY 6 HOURS AS NEEDED FOR WHEEZING OR SHORTNESS OF BREATH What changed:  See the new instructions.   propranolol ER 60 MG 24 hr capsule Commonly known as:  INDERAL LA Take 1 capsule (60 mg total) by mouth daily.   sucralfate 1 g tablet Commonly known as:  CARAFATE Take 1 tablet (1 g total) by mouth 4 (four) times daily.   ULORIC 40 MG tablet Generic drug:  febuxostat Take 1 tablet (40 mg total) by mouth daily. Brand Name Uloric   Vitamin D 50 MCG (2000 UT) tablet Take 6,000 Units by mouth daily.            Durable Medical Equipment  (From  admission, onward)  Start     Ordered   12/21/18 0904  For home use only DME Nebulizer machine  Once    Question:  Patient needs a nebulizer to treat with the following condition  Answer:  Reactive airway disease   12/21/18 3500           Follow-up Information    Mosie Lukes, MD. Schedule an appointment as soon as possible for a visit in 1 week(s).   Specialty:  Family Medicine Contact information: [place] [address] [phone]           TOTAL DISCHARGE TIME: 35 minutes  [place]  [facility] Pager on [email or web address]  12/21/2018, 2:03 PM

## 2018-12-21 NOTE — Plan of Care (Signed)
Pt to d/c home today. Pt overall remains stable though short of breath with moderate exertion, though this is relieved with breathing treatments. No changes to not overall. Pt to get home nebulizer machine.

## 2018-12-21 NOTE — Care Management (Signed)
Contacted [facility] for neb machine for home. Makara Lanzo RN CCM Case Mgmt phone [phone] 

## 2018-12-21 NOTE — Progress Notes (Signed)
Pt stable at time of d/c education and instructions. No needs at time of d/c. Pt was given home nebulizer machine.

## 2018-12-22 ENCOUNTER — Telehealth: Payer: Self-pay | Admitting: *Deleted

## 2018-12-22 NOTE — Telephone Encounter (Signed)
Left message to return call to office.

## 2018-12-23 NOTE — Telephone Encounter (Signed)
Transition Care Management Follow-up Telephone Call  Date discharged?12/21/18  How have you been since you were released from the hospital? "I am doing better"   Do you understand why you were in the hospital? yes   Do you understand the discharge instructions? yes   Where were you discharged to? Home.    Items Reviewed:  Medications reviewed: Pt states she is currently laying down and doesn't have list near by. Reports they added an abx.  Allergies reviewed: yes  Dietary changes reviewed: yes  Referrals reviewed: yes   Functional Questionnaire:   Activities of Daily Living (ADLs):   She states they are independent in the following: ambulation, bathing and hygiene, feeding, continence, grooming, toileting and dressing States they require assistance with the following: na   Any transportation issues/concerns?: no   Any patient concerns? no   Confirmed importance and date/time of follow-up visits scheduled yes  Provider Appointment booked with Debbrah Alar NP 12/30/18 @1120   Confirmed with patient if condition begins to worsen call PCP or go to the ER.  Patient was given the office number and encouraged to call back with question or concerns.  : yes 2/9/

## 2018-12-30 ENCOUNTER — Telehealth: Payer: Self-pay | Admitting: Family

## 2018-12-30 ENCOUNTER — Ambulatory Visit (INDEPENDENT_AMBULATORY_CARE_PROVIDER_SITE_OTHER): Payer: Medicare Other | Admitting: Family

## 2018-12-30 ENCOUNTER — Encounter: Payer: Self-pay | Admitting: Family

## 2018-12-30 VITALS — BP 121/60 | HR 70 | Temp 99.0°F | Resp 16 | Ht 61.0 in | Wt 159.0 lb

## 2018-12-30 DIAGNOSIS — R195 Other fecal abnormalities: Secondary | ICD-10-CM

## 2018-12-30 DIAGNOSIS — I10 Essential (primary) hypertension: Secondary | ICD-10-CM | POA: Diagnosis not present

## 2018-12-30 DIAGNOSIS — J441 Chronic obstructive pulmonary disease with (acute) exacerbation: Secondary | ICD-10-CM | POA: Diagnosis not present

## 2018-12-30 DIAGNOSIS — D649 Anemia, unspecified: Secondary | ICD-10-CM

## 2018-12-30 DIAGNOSIS — E559 Vitamin D deficiency, unspecified: Secondary | ICD-10-CM

## 2018-12-30 DIAGNOSIS — H109 Unspecified conjunctivitis: Secondary | ICD-10-CM

## 2018-12-30 LAB — CBC WITH DIFFERENTIAL/PLATELET
Basophils Absolute: 0 10*3/uL (ref 0.0–0.1)
Basophils Relative: 0.2 % (ref 0.0–3.0)
Eosinophils Absolute: 0.2 10*3/uL (ref 0.0–0.7)
Eosinophils Relative: 1.1 % (ref 0.0–5.0)
HCT: 39.2 % (ref 36.0–46.0)
Hemoglobin: 12.8 g/dL (ref 12.0–15.0)
LYMPHS PCT: 15.7 % (ref 12.0–46.0)
Lymphs Abs: 3.3 10*3/uL (ref 0.7–4.0)
MCHC: 32.7 g/dL (ref 30.0–36.0)
MCV: 96.8 fl (ref 78.0–100.0)
MONOS PCT: 6.2 % (ref 3.0–12.0)
Monocytes Absolute: 1.3 10*3/uL — ABNORMAL HIGH (ref 0.1–1.0)
Neutro Abs: 15.9 10*3/uL — ABNORMAL HIGH (ref 1.4–7.7)
Neutrophils Relative %: 76.8 % (ref 43.0–77.0)
Platelets: 287 10*3/uL (ref 150.0–400.0)
RBC: 4.05 Mil/uL (ref 3.87–5.11)
RDW: 14.3 % (ref 11.5–15.5)
WBC: 20.8 10*3/uL (ref 4.0–10.5)

## 2018-12-30 LAB — COMPREHENSIVE METABOLIC PANEL
ALT: 8 U/L (ref 0–35)
AST: 18 U/L (ref 0–37)
Albumin: 4.4 g/dL (ref 3.5–5.2)
Alkaline Phosphatase: 68 U/L (ref 39–117)
BUN: 43 mg/dL — ABNORMAL HIGH (ref 6–23)
CO2: 31 mEq/L (ref 19–32)
Calcium: 11.3 mg/dL — ABNORMAL HIGH (ref 8.4–10.5)
Chloride: 101 mEq/L (ref 96–112)
Creatinine, Ser: 1.73 mg/dL — ABNORMAL HIGH (ref 0.40–1.20)
GFR: 28 mL/min — AB (ref >60.00)
Glucose, Bld: 100 mg/dL — ABNORMAL HIGH (ref 70–99)
Potassium: 5.6 mEq/L — ABNORMAL HIGH (ref 3.5–5.1)
Sodium: 142 mEq/L (ref 135–145)
Total Bilirubin: 0.7 mg/dL (ref 0.2–1.2)
Total Protein: 7.3 g/dL (ref 6.0–8.3)

## 2018-12-30 MED ORDER — AMLODIPINE BESYLATE 5 MG PO TABS: 5.0000 mg | ORAL_TABLET | Freq: Every day | ORAL | 3 refills | Status: DC

## 2018-12-30 NOTE — Telephone Encounter (Signed)
Looks like she is dehydrated.  Decrease lasix to 1/2 tab daily for now.  Increase fluids.    Potassium is high. Stop losartan,  Start amlodipine 5mg . Follow up with me or Dr. Charlett Blake on Thursday for follow up labs.

## 2018-12-30 NOTE — Patient Instructions (Addendum)
Please complete lab work prior to leaving. Complete stool kit and return at your earliest convenience.

## 2018-12-30 NOTE — Progress Notes (Signed)
Subjective:    Patient ID: Christina Mckinney, female    DOB: May 11, 1932, 85 y.o.   MRN: [id number]  HPI  Patient is an 85 yr old female who presents today for hospital follow up. She was admitted 2/5-2/9 with sepsis/acute respiratory failure, copd, and L OM.  She was discharged home with orders for outpatient PT.  It was felt that her sepsis stemmed from respiratory source and she wast treated empirically with ceftriaxone and azithromycin. She was given a nebulizer machine for home use at discharge.   He was also treated for conjunctivitis.  Reports symptoms have resolved with eyedrops.  Reports that she has had vertigo since June but this has resolved sine her treatment for OM which she is pleased about.  She reports that she has been doing exercises at home and her strength is improving.  She reports that her breathing is "wonderful."  Reports that she has been doing the incentive spirometer daily which is helpful.   At the end of the visit she notes that she saw a worm in her stool and she is concerned that she may have pinworms.  She spends time around young grandchildren.  Review of Systems Past Medical History:  Diagnosis Date  . Anemia, unspecified   . Anxiety and depression 09/18/2015  . Anxiety state, unspecified   . Chest pain   . Chronic systolic CHF (congestive heart failure) ([place]) 08/12/2015  . Depression with anxiety 06/02/2007   Qualifier: Diagnosis of  By: Wynona Luna   . Depressive disorder, not elsewhere classified   . Diverticulitis   . DVT (deep venous thrombosis) ([place])   . Dyspnea 07/22/2015  . Edema 06/12/2014  . Essential and other specified forms of tremor   . GERD (gastroesophageal reflux disease)   . Gout, unspecified   . Internal hemorrhoids without mention of complication   . Macular degeneration 08/07/2015  . Osteoarthritis   . Other abnormal glucose   . Other and unspecified hyperlipidemia   . Personal history of colonic polyps   . Personal history of  peptic ulcer disease   . Pulmonary embolism ([place])   . Unspecified disorder of bladder   . Unspecified essential hypertension   . Unspecified hypothyroidism   . Varicose veins      Social History   Socioeconomic History  . Marital status: Widowed    Spouse name: Not on file  . Number of children: 3  . Years of education: Not on file  . Highest education level: Not on file  Occupational History  . Occupation: Retired   Scientific laboratory technician  . Financial resource strain: Not on file  . Food insecurity:    Worry: Not on file    Inability: Not on file  . Transportation needs:    Medical: Not on file    Non-medical: Not on file  Tobacco Use  . Smoking status: Former Smoker    Types: Cigarettes    Last attempt to quit: 11/12/1988    Years since quitting: 30.1  . Smokeless tobacco: Never Used  Substance and Sexual Activity  . Alcohol use: Yes    Alcohol/week: 4.0 standard drinks    Types: 4 Standard drinks or equivalent per week    Comment: occasional wine   . Drug use: No  . Sexual activity: Never    Comment: lives alone, no dietary restrictions, widowed  Lifestyle  . Physical activity:    Days per week: Not on file    Minutes per  session: Not on file  . Stress: Not on file  Relationships  . Social connections:    Talks on phone: Not on file    Gets together: Not on file    Attends religious service: Not on file    Active member of club or organization: Not on file    Attends meetings of clubs or organizations: Not on file    Relationship status: Not on file  . Intimate partner violence:    Fear of current or ex partner: Not on file    Emotionally abused: Not on file    Physically abused: Not on file    Forced sexual activity: Not on file  Other Topics Concern  . Not on file  Social History Narrative  . Not on file    Past Surgical History:  Procedure Laterality Date  . ABDOMINAL HYSTERECTOMY    . CHOLECYSTECTOMY    . ENDOVENOUS ABLATION SAPHENOUS VEIN W/ LASER   09-11-2012   left greater saphenous vein   by Curt Jews MD  . EYE SURGERY    . KNEE ARTHROSCOPY     right   . NASAL SEPTUM SURGERY    . SHOULDER SURGERY    . TOTAL KNEE ARTHROPLASTY  09/2013   Right    Family History  Problem Relation Age of Onset  . Other Mother        varicose veins  . Hip fracture Mother   . Heart disease Father        CHF  . Hyperlipidemia Father   . Heart attack Father   . Diabetes Daughter   . Heart disease Daughter   . Hyperlipidemia Daughter   . Hypertension Daughter   . Aneurysm Daughter   . Cancer Sister        stomach  . Heart disease Sister   . Heart disease Brother   . Colon cancer Neg Hx     Allergies  Allergen Reactions  . Simvastatin Other (See Comments)    Hip pain  . Crestor [Rosuvastatin]     Left hip pain  . Lipitor [Atorvastatin]     Pain in hips  . Red Dye     Hip pain  . Statins Other (See Comments)    Muscle aches, could not walk    Current Outpatient Medications on File Prior to Visit  Medication Sig Dispense Refill  . ALPRAZolam (XANAX) 0.5 MG tablet Take 1 tablet (0.5 mg total) by mouth at bedtime as needed for anxiety. 90 tablet 1  . aspirin 81 MG tablet Take 1 tablet (81 mg total) by mouth daily. 30 tablet   . Cholecalciferol (VITAMIN D) 2000 UNITS tablet Take 6,000 Units by mouth daily.     . ciprofloxacin (CILOXAN) 0.3 % ophthalmic solution 1 drop to both eyes every 4 hours, while awake, for 5 days. 5 mL 0  . co-enzyme Q-10 30 MG capsule Take 30 mg by mouth daily.     . famotidine (PEPCID) 40 MG tablet Take 1 tablet (40 mg total) by mouth daily. 90 tablet 1  . fenofibrate micronized (LOFIBRA) 134 MG capsule TAKE 1 CAPSULE DAILY 90 capsule 50  . fexofenadine (ALLEGRA ALLERGY) 180 MG tablet Take 1 tablet (180 mg total) by mouth daily. 90 tablet 1  . fluticasone (FLONASE) 50 MCG/ACT nasal spray USE 2 SPRAYS IN EACH NOSTRIL DAILY AS NEEDED FOR ALLERGIES OR RHINITIS (Patient taking differently: Place 2 sprays into  both nostrils daily. USE 2 SPRAYS IN EACH NOSTRIL DAILY  AS NEEDED FOR ALLERGIES OR RHINITIS) 48 g 1  . fluticasone (FLOVENT HFA) 110 MCG/ACT inhaler Inhale 2 puffs into the lungs 2 (two) times daily. 3 Inhaler 3  . furosemide (LASIX) 40 MG tablet TAKE 1 TABLET DAILY AS DIRECTED 90 tablet 4  . gabapentin (NEURONTIN) 300 MG capsule Take 1 capsule (300 mg total) by mouth at bedtime. 90 capsule 1  . guaiFENesin (MUCINEX) 600 MG 12 hr tablet Take 1,200 mg by mouth 2 (two) times daily as needed for to loosen phlegm.     Marland Kitchen ipratropium-albuterol (DUONEB) 0.5-2.5 (3) MG/3ML SOLN Take 3 times a day for next 3 days and then as needed for wheezing. 360 mL 0  . levothyroxine (SYNTHROID, LEVOTHROID) 50 MCG tablet TAKE 1 TABLET DAILY BEFORE BREAKFAST 90 tablet 4  . losartan (COZAAR) 50 MG tablet TAKE 1 TABLET DAILY 90 tablet 1  . montelukast (SINGULAIR) 10 MG tablet TAKE 1 TABLET AT BEDTIME 90 tablet 4  . pantoprazole (PROTONIX) 40 MG tablet Take 1 tablet (40 mg total) by mouth 2 (two) times daily. 180 tablet 5  . PATADAY 0.2 % SOLN Place 1 drop into both eyes daily.    Marland Kitchen PROAIR HFA 108 (90 Base) MCG/ACT inhaler USE 2 INHALATIONS EVERY 6 HOURS AS NEEDED FOR WHEEZING OR SHORTNESS OF BREATH (Patient taking differently: Inhale 2 puffs into the lungs every 6 (six) hours as needed for wheezing or shortness of breath. ) 25.5 g 1  . propranolol ER (INDERAL LA) 60 MG 24 hr capsule Take 1 capsule (60 mg total) by mouth daily. 90 capsule 1  . sucralfate (CARAFATE) 1 g tablet Take 1 tablet (1 g total) by mouth 4 (four) times daily. 360 tablet 0  . ULORIC 40 MG tablet Take 1 tablet (40 mg total) by mouth daily. Brand Name Uloric 90 tablet 1   No current facility-administered medications on file prior to visit.     BP 121/60 (BP Location: Right Arm, Patient Position: Sitting, Cuff Size: Small)   Pulse 70   Temp 99 F (37.2 C) (Oral)   Resp 16   Ht 5\' 1"  (1.549 m)   Wt 159 lb (72.1 kg)   SpO2 97%   BMI 30.04 kg/m        Objective:   Physical Exam Constitutional:      Appearance: She is well-developed.  HENT:     Right Ear: Tympanic membrane and ear canal normal.     Left Ear: Tympanic membrane and ear canal normal.  Eyes:     General:        Right eye: No discharge.        Left eye: No discharge.  Neck:     Musculoskeletal: Neck supple.     Thyroid: No thyromegaly.  Cardiovascular:     Rate and Rhythm: Normal rate and regular rhythm.     Heart sounds: Normal heart sounds. No murmur.  Pulmonary:     Effort: Pulmonary effort is normal. No respiratory distress.     Breath sounds: Normal breath sounds. No wheezing.  Skin:    General: Skin is warm and dry.  Neurological:     Mental Status: She is alert and oriented to person, place, and time.  Psychiatric:        Behavior: Behavior normal.        Thought Content: Thought content normal.        Judgment: Judgment normal.           Assessment &  Plan:  COPD exacerbation- clinically improved.  Patient has completed antibiotics.  Oxygen is 97% on room air today.  Continue nebulizers as needed.  Anemia-will obtain follow-up CBC.  Stool abnormality-will have her bring in a stool sample for ova and parasites.  Vitamin D deficiency Will check vitamin D level.  Hypertension- blood pressure is stable on current medications.  Continue same  Conjunctivitis- this is resolved.

## 2018-12-31 LAB — VITAMIN D 25 HYDROXY (VIT D DEFICIENCY, FRACTURES): VITD: 110.19 ng/mL (ref 30.00–100.00)

## 2018-12-31 NOTE — Telephone Encounter (Signed)
Patient advised of results, to decrease lasix to 1/2 tablet and to switch bp medication from losartan to amlodipine. Appointment was scheduled for next Friday (Thursday was not available)

## 2018-12-31 NOTE — Addendum Note (Signed)
Addended by: Caffie Pinto on: 12/31/2018 08:17 AM   Modules accepted: Orders

## 2019-01-09 ENCOUNTER — Telehealth: Payer: Self-pay | Admitting: Family

## 2019-01-09 ENCOUNTER — Encounter: Payer: Self-pay | Admitting: Family

## 2019-01-09 ENCOUNTER — Ambulatory Visit (HOSPITAL_BASED_OUTPATIENT_CLINIC_OR_DEPARTMENT_OTHER)
Admission: RE | Admit: 2019-01-09 | Discharge: 2019-01-09 | Disposition: A | Discharge status: Home / Self Care | Payer: Medicare Other | Source: Ambulatory Visit | Attending: Family | Admitting: Family

## 2019-01-09 ENCOUNTER — Ambulatory Visit (INDEPENDENT_AMBULATORY_CARE_PROVIDER_SITE_OTHER): Payer: Medicare Other | Admitting: Family

## 2019-01-09 DIAGNOSIS — M549 Dorsalgia, unspecified: Secondary | ICD-10-CM

## 2019-01-09 DIAGNOSIS — M79604 Pain in right leg: Secondary | ICD-10-CM | POA: Diagnosis not present

## 2019-01-09 DIAGNOSIS — R195 Other fecal abnormalities: Secondary | ICD-10-CM

## 2019-01-09 DIAGNOSIS — J449 Chronic obstructive pulmonary disease, unspecified: Secondary | ICD-10-CM | POA: Diagnosis not present

## 2019-01-09 DIAGNOSIS — M79605 Pain in left leg: Secondary | ICD-10-CM | POA: Diagnosis not present

## 2019-01-09 DIAGNOSIS — D72829 Elevated white blood cell count, unspecified: Secondary | ICD-10-CM

## 2019-01-09 DIAGNOSIS — M545 Low back pain: Secondary | ICD-10-CM | POA: Diagnosis not present

## 2019-01-09 DIAGNOSIS — E875 Hyperkalemia: Secondary | ICD-10-CM

## 2019-01-09 DIAGNOSIS — M79662 Pain in left lower leg: Secondary | ICD-10-CM | POA: Diagnosis not present

## 2019-01-09 DIAGNOSIS — I5022 Chronic systolic (congestive) heart failure: Secondary | ICD-10-CM

## 2019-01-09 DIAGNOSIS — M79661 Pain in right lower leg: Secondary | ICD-10-CM | POA: Diagnosis not present

## 2019-01-09 DIAGNOSIS — N289 Disorder of kidney and ureter, unspecified: Secondary | ICD-10-CM | POA: Diagnosis not present

## 2019-01-09 NOTE — Patient Instructions (Addendum)
Please complete lab work prior to leaving.  Complete your ultrasound on the first floor in imaging.

## 2019-01-09 NOTE — Progress Notes (Signed)
Subjective:    Patient ID: Christina Mckinney, female    DOB: 11-Jun-1932, 85 y.o.   MRN: [id number]  HPI  Patient is an 85 year old female who presents today for follow-up.  Last visit she was noted to have acute on chronic renal insufficiency.  She was advised to decrease her Lasix to half tablet and to discontinue losartan.  Instead amlodipine was added to her regimen.  She reports that her legs are slightly more swollen since last visit.  Her chief complaint today is  Lower extremity pain in both upper anterior thighs. Patient has history of PE.  She is concerned about the lower extremity pain representing "blood clots."  Notes that her back has been hurting more while walking.     Hypertension- BP Readings from Last 3 Encounters:  01/09/19 126/60  12/30/18 121/60  12/21/18 (!) 166/67   Hyperkalemia-noted on her last labs.   COPD exacerbation- reports breathing well since last visit.   Vit D deficiency- vit D level was elevated last visit.   Wt Readings from Last 3 Encounters:  01/09/19 159 lb (72.1 kg)  12/30/18 159 lb (72.1 kg)  12/18/18 165 lb (74.8 kg)   Chronic systolic CHF- since the last visit we have cut her dose of lasix from 40mg  daily to 20mg  once daily.    Review of Systems See HPI  Past Medical History:  Diagnosis Date  . Anemia, unspecified   . Anxiety and depression 09/18/2015  . Anxiety state, unspecified   . Chest pain   . Chronic systolic CHF (congestive heart failure) ([place]) 08/12/2015  . Depression with anxiety 06/02/2007   Qualifier: Diagnosis of  By: Wynona Luna   . Depressive disorder, not elsewhere classified   . Diverticulitis   . DVT (deep venous thrombosis) ([place])   . Dyspnea 07/22/2015  . Edema 06/12/2014  . Essential and other specified forms of tremor   . GERD (gastroesophageal reflux disease)   . Gout, unspecified   . Internal hemorrhoids without mention of complication   . Macular degeneration 08/07/2015  . Osteoarthritis   . Other  abnormal glucose   . Other and unspecified hyperlipidemia   . Personal history of colonic polyps   . Personal history of peptic ulcer disease   . Pulmonary embolism ([place])   . Unspecified disorder of bladder   . Unspecified essential hypertension   . Unspecified hypothyroidism   . Varicose veins      Social History   Socioeconomic History  . Marital status: Widowed    Spouse name: Not on file  . Number of children: 3  . Years of education: Not on file  . Highest education level: Not on file  Occupational History  . Occupation: Retired   Scientific laboratory technician  . Financial resource strain: Not on file  . Food insecurity:    Worry: Not on file    Inability: Not on file  . Transportation needs:    Medical: Not on file    Non-medical: Not on file  Tobacco Use  . Smoking status: Former Smoker    Types: Cigarettes    Last attempt to quit: 11/12/1988    Years since quitting: 30.1  . Smokeless tobacco: Never Used  Substance and Sexual Activity  . Alcohol use: Yes    Alcohol/week: 4.0 standard drinks    Types: 4 Standard drinks or equivalent per week    Comment: occasional wine   . Drug use: No  . Sexual activity: Never  Comment: lives alone, no dietary restrictions, widowed  Lifestyle  . Physical activity:    Days per week: Not on file    Minutes per session: Not on file  . Stress: Not on file  Relationships  . Social connections:    Talks on phone: Not on file    Gets together: Not on file    Attends religious service: Not on file    Active member of club or organization: Not on file    Attends meetings of clubs or organizations: Not on file    Relationship status: Not on file  . Intimate partner violence:    Fear of current or ex partner: Not on file    Emotionally abused: Not on file    Physically abused: Not on file    Forced sexual activity: Not on file  Other Topics Concern  . Not on file  Social History Narrative  . Not on file    Past Surgical History:    Procedure Laterality Date  . ABDOMINAL HYSTERECTOMY    . CHOLECYSTECTOMY    . ENDOVENOUS ABLATION SAPHENOUS VEIN W/ LASER  09-11-2012   left greater saphenous vein   by Curt Jews MD  . EYE SURGERY    . KNEE ARTHROSCOPY     right   . NASAL SEPTUM SURGERY    . SHOULDER SURGERY    . TOTAL KNEE ARTHROPLASTY  09/2013   Right    Family History  Problem Relation Age of Onset  . Other Mother        varicose veins  . Hip fracture Mother   . Heart disease Father        CHF  . Hyperlipidemia Father   . Heart attack Father   . Diabetes Daughter   . Heart disease Daughter   . Hyperlipidemia Daughter   . Hypertension Daughter   . Aneurysm Daughter   . Cancer Sister        stomach  . Heart disease Sister   . Heart disease Brother   . Colon cancer Neg Hx     Allergies  Allergen Reactions  . Simvastatin Other (See Comments)    Hip pain  . Crestor [Rosuvastatin]     Left hip pain  . Lipitor [Atorvastatin]     Pain in hips  . Red Dye     Hip pain  . Statins Other (See Comments)    Muscle aches, could not walk    Current Outpatient Medications on File Prior to Visit  Medication Sig Dispense Refill  . ALPRAZolam (XANAX) 0.5 MG tablet Take 1 tablet (0.5 mg total) by mouth at bedtime as needed for anxiety. 90 tablet 1  . amLODipine (NORVASC) 5 MG tablet Take 1 tablet (5 mg total) by mouth daily. 30 tablet 3  . aspirin 81 MG tablet Take 1 tablet (81 mg total) by mouth daily. 30 tablet   . Cholecalciferol (VITAMIN D) 2000 UNITS tablet Take 6,000 Units by mouth daily.     Marland Kitchen co-enzyme Q-10 30 MG capsule Take 30 mg by mouth daily.     . famotidine (PEPCID) 40 MG tablet Take 1 tablet (40 mg total) by mouth daily. 90 tablet 1  . fenofibrate micronized (LOFIBRA) 134 MG capsule TAKE 1 CAPSULE DAILY 90 capsule 50  . fexofenadine (ALLEGRA ALLERGY) 180 MG tablet Take 1 tablet (180 mg total) by mouth daily. 90 tablet 1  . fluticasone (FLONASE) 50 MCG/ACT nasal spray USE 2 SPRAYS IN EACH  NOSTRIL DAILY  AS NEEDED FOR ALLERGIES OR RHINITIS (Patient taking differently: Place 2 sprays into both nostrils daily. USE 2 SPRAYS IN EACH NOSTRIL DAILY AS NEEDED FOR ALLERGIES OR RHINITIS) 48 g 1  . fluticasone (FLOVENT HFA) 110 MCG/ACT inhaler Inhale 2 puffs into the lungs 2 (two) times daily. 3 Inhaler 3  . furosemide (LASIX) 40 MG tablet TAKE 1 TABLET DAILY AS DIRECTED 90 tablet 4  . gabapentin (NEURONTIN) 300 MG capsule Take 1 capsule (300 mg total) by mouth at bedtime. 90 capsule 1  . guaiFENesin (MUCINEX) 600 MG 12 hr tablet Take 1,200 mg by mouth 2 (two) times daily as needed for to loosen phlegm.     Marland Kitchen ipratropium-albuterol (DUONEB) 0.5-2.5 (3) MG/3ML SOLN Take 3 times a day for next 3 days and then as needed for wheezing. 360 mL 0  . levothyroxine (SYNTHROID, LEVOTHROID) 50 MCG tablet TAKE 1 TABLET DAILY BEFORE BREAKFAST 90 tablet 4  . montelukast (SINGULAIR) 10 MG tablet TAKE 1 TABLET AT BEDTIME 90 tablet 4  . pantoprazole (PROTONIX) 40 MG tablet Take 1 tablet (40 mg total) by mouth 2 (two) times daily. 180 tablet 5  . PATADAY 0.2 % SOLN Place 1 drop into both eyes daily.    Marland Kitchen PROAIR HFA 108 (90 Base) MCG/ACT inhaler USE 2 INHALATIONS EVERY 6 HOURS AS NEEDED FOR WHEEZING OR SHORTNESS OF BREATH (Patient taking differently: Inhale 2 puffs into the lungs every 6 (six) hours as needed for wheezing or shortness of breath. ) 25.5 g 1  . propranolol ER (INDERAL LA) 60 MG 24 hr capsule Take 1 capsule (60 mg total) by mouth daily. 90 capsule 1  . sucralfate (CARAFATE) 1 g tablet Take 1 tablet (1 g total) by mouth 4 (four) times daily. 360 tablet 0  . ULORIC 40 MG tablet Take 1 tablet (40 mg total) by mouth daily. Brand Name Uloric 90 tablet 1   No current facility-administered medications on file prior to visit.     BP 126/60 (BP Location: Right Arm, Patient Position: Sitting, Cuff Size: Small)   Pulse 68   Temp 98.3 F (36.8 C) (Oral)   Resp 16   Ht 5\' 1"  (1.549 m)   Wt 159 lb (72.1 kg)    SpO2 100%   BMI 30.04 kg/m       Objective:   Physical Exam Constitutional:      Appearance: She is well-developed.  Neck:     Musculoskeletal: Neck supple.     Thyroid: No thyromegaly.  Cardiovascular:     Rate and Rhythm: Normal rate and regular rhythm.     Pulses:          Popliteal pulses are 2+ on the right side and 2+ on the left side.       Dorsalis pedis pulses are 2+ on the right side and 2+ on the left side.     Heart sounds: Normal heart sounds. No murmur.  Pulmonary:     Effort: Pulmonary effort is normal. No respiratory distress.     Breath sounds: Normal breath sounds. No wheezing.  Skin:    General: Skin is warm and dry.  Neurological:     Mental Status: She is alert and oriented to person, place, and time.  Psychiatric:        Behavior: Behavior normal.        Thought Content: Thought content normal.        Judgment: Judgment normal.  Assessment & Plan:  Bilateral LE pain- will obtain bilateral LE doppler to rule out DVT.  COPD- clinically stable on current meds. Monitor.  Acute renal insufficiency- will obtain follow up bmet today.  Leukocytosis- will repeat today. Was felt last visit to be secondary to recent steroid use.   Back pain- pt is requesting a back x-ray which has been ordered.   Hypercalcemia- obtain follow up calcium level.  Hyperkalemia- will repeat today off of ARB.  Abnormal findings in stool- she brings her stool sample today for O and P evaluation- last visit she though she saw pinworms.  HTN- bp looks good following discontinuation of ARB.    CHF- weight is stable.  Slight increase in LE edema could be due to addition of amlodipine.  Continue current dose of lasix for now.

## 2019-01-09 NOTE — Telephone Encounter (Signed)
Spoke to patient reviewed negative lower extremity Doppler results.  She asks if she can increase to a full tablet of Lasix daily over the weekend if her swelling worsens and I advised her okay to do so.  Will await x-ray and lab results.

## 2019-01-10 LAB — CBC WITH DIFFERENTIAL/PLATELET
ABSOLUTE MONOCYTES: 472 {cells}/uL (ref 200–950)
Basophils Absolute: 30 cells/uL (ref 0–200)
Basophils Relative: 0.5 %
EOS ABS: 431 {cells}/uL (ref 15–500)
Eosinophils Relative: 7.3 %
HCT: 34.1 % — ABNORMAL LOW (ref 35.0–45.0)
Hemoglobin: 11.5 g/dL — ABNORMAL LOW (ref 11.7–15.5)
Lymphs Abs: 1794 cells/uL (ref 850–3900)
MCH: 31.9 pg (ref 27.0–33.0)
MCHC: 33.7 g/dL (ref 32.0–36.0)
MCV: 94.5 fL (ref 80.0–100.0)
MPV: 11.8 fL (ref 7.5–12.5)
Monocytes Relative: 8 %
Neutro Abs: 3174 cells/uL (ref 1500–7800)
Neutrophils Relative %: 53.8 %
Platelets: 226 10*3/uL (ref 140–400)
RBC: 3.61 10*6/uL — ABNORMAL LOW (ref 3.80–5.10)
RDW: 13 % (ref 11.0–15.0)
Total Lymphocyte: 30.4 %
WBC: 5.9 10*3/uL (ref 3.8–10.8)

## 2019-01-10 LAB — COMPREHENSIVE METABOLIC PANEL
AG Ratio: 1.7 (calc) (ref 1.0–2.5)
ALT: 9 U/L (ref 6–29)
AST: 35 U/L (ref 10–35)
Albumin: 4.2 g/dL (ref 3.6–5.1)
Alkaline phosphatase (APISO): 75 U/L (ref 37–153)
BUN/Creatinine Ratio: 13 (calc) (ref 6–22)
BUN: 16 mg/dL (ref 7–25)
CO2: 27 mmol/L (ref 20–32)
Calcium: 10.2 mg/dL (ref 8.6–10.4)
Chloride: 103 mmol/L (ref 98–110)
Creat: 1.2 mg/dL — ABNORMAL HIGH (ref 0.60–0.88)
Globulin: 2.5 g/dL (calc) (ref 1.9–3.7)
Glucose, Bld: 89 mg/dL (ref 65–99)
Potassium: 3.8 mmol/L (ref 3.5–5.3)
Sodium: 141 mmol/L (ref 135–146)
Total Bilirubin: 0.5 mg/dL (ref 0.2–1.2)
Total Protein: 6.7 g/dL (ref 6.1–8.1)

## 2019-01-12 LAB — PARATHYROID HORMONE, INTACT (NO CA): PTH: 74 pg/mL — ABNORMAL HIGH (ref 14–64)

## 2019-01-13 ENCOUNTER — Telehealth: Payer: Self-pay

## 2019-01-13 ENCOUNTER — Telehealth: Payer: Self-pay | Admitting: Family

## 2019-01-13 DIAGNOSIS — E213 Hyperparathyroidism, unspecified: Secondary | ICD-10-CM

## 2019-01-13 LAB — OVA AND PARASITE EXAMINATION
CONCENTRATE RESULT:: NONE SEEN
MICRO NUMBER:: 256591
SPECIMEN QUALITY:: ADEQUATE
TRICHROME RESULT:: NONE SEEN

## 2019-01-13 NOTE — Telephone Encounter (Signed)
OK then she can stop the Amlodipine and we can start Metoprolol XL 25 mg po daily. Disp #30 with 1 rf and have her come in for BP check in 3-4 week for bp check

## 2019-01-13 NOTE — Telephone Encounter (Signed)
Please contact pt and let her know that lab work shows overactive parathyroid hormone which helps control her calcium.  I would like her to see endocrinology for this. Also, stool is negative for pin worms.

## 2019-01-13 NOTE — Telephone Encounter (Signed)
Spoke with patient she stated she can not take this medication she stated this medication made her have gas and burp a lot. She stated she can not swell as big as they where she stated they where double the size. She also stated she had taking a full tab of lasix and that did not help to her knowledge.

## 2019-01-13 NOTE — Telephone Encounter (Signed)
Copied from [place] [id number]. Topic: General - Other >> Jan 12, 2019  2:07 PM Carolyn Stare wrote:  Pt call and said the below med made her feet swell   amLODipine (NORVASC) 5 MG tablet  She said she went back to her original bp med COZAAR     Would like a call back     Please advise

## 2019-01-13 NOTE — Telephone Encounter (Signed)
Not a good idea to switch back her kidneys look better since the switch. Have her increase her Lasix back up to a full tab, elevate feet above heart 10-15 minutes twice daily and see if that helps. Give her an appt in next month to evaluate further

## 2019-01-14 NOTE — Telephone Encounter (Signed)
Advised patient of results and referral to endo.

## 2019-01-15 MED ORDER — METOPROLOL SUCCINATE ER 25 MG PO TB24: 25.0000 mg | ORAL_TABLET | Freq: Every day | ORAL | 3 refills | Status: DC

## 2019-01-15 NOTE — Telephone Encounter (Signed)
Spoke with patient about the new medication she voiced her understanding

## 2019-01-23 ENCOUNTER — Ambulatory Visit (INDEPENDENT_AMBULATORY_CARE_PROVIDER_SITE_OTHER): Payer: Medicare Other | Admitting: Internal Medicine

## 2019-01-23 ENCOUNTER — Other Ambulatory Visit: Payer: Self-pay

## 2019-01-23 ENCOUNTER — Encounter: Payer: Self-pay | Admitting: Internal Medicine

## 2019-01-23 VITALS — BP 124/62 | HR 76 | Ht 61.0 in | Wt 158.0 lb

## 2019-01-23 DIAGNOSIS — E673 Hypervitaminosis D: Secondary | ICD-10-CM | POA: Diagnosis not present

## 2019-01-23 DIAGNOSIS — E213 Hyperparathyroidism, unspecified: Secondary | ICD-10-CM

## 2019-01-23 NOTE — Patient Instructions (Signed)
-   Encouraged hydration  - AVOID CALCIUM SUPPLEMENTS, AVOID LOW CALCIUM DIET - Maintain normal dietary calcium intake (2-3 servings of dairy a day)

## 2019-01-23 NOTE — Progress Notes (Signed)
Name: Christina Mckinney  MRN/ DOB: [id number], 12-Jun-1932    Age/ Sex: 85 y.o., female    PCP: Mosie Lukes, MD   Reason for Endocrinology Evaluation: Hypercalcemia      Date of Initial Endocrinology Evaluation: 01/23/2019     HPI: Christina Mckinney is a 85 y.o. female with a past medical history of Dyslipidemia, depression, anxiety, RLS and essential tremors. The patient presented for initial endocrinology clinic visit on 01/23/2019 for consultative assistance with her hyperalcemia.   Christina Mckinney indicates that she was first diagnosed with hypercalcemia in 2020, at which time this was found on routine work up. Since that time, she has not experienced symptoms of constipation, polydipsia, but has generalized weakness, and polyuria . She denies diffuse muscle pains, significant memory impairment. She has stopped the use of over the counter calcium (including supplements, Tums, Rolaids, or other calcium containing antacids), she stopped taking the 6000 iu daily dose of Vitamin D in February after she was found to have levels over 100 ng/dL.  She denies prior use of  Lithium or HCTZ.  She denies history of kidney stones, kidney disease, liver disease, granulomatous disease. She denies osteoporosis , she had a left hand fracture tripped over the steps. Daily dietary calcium intake: 0 servings . She denies family history of osteoporosis, parathyroid disease, thyroid disease.  Son and 2 grandsons with kidney stones.       HISTORY:  Past Medical History:  Past Medical History:  Diagnosis Date  . Anemia, unspecified   . Anxiety and depression 09/18/2015  . Anxiety state, unspecified   . Chest pain   . Chronic systolic CHF (congestive heart failure) ([place]) 08/12/2015  . Depression with anxiety 06/02/2007   Qualifier: Diagnosis of  By: Wynona Luna   . Depressive disorder, not elsewhere classified   . Diverticulitis   . DVT (deep venous thrombosis) ([place])   . Dyspnea 07/22/2015  . Edema  06/12/2014  . Essential and other specified forms of tremor   . GERD (gastroesophageal reflux disease)   . Gout, unspecified   . Internal hemorrhoids without mention of complication   . Macular degeneration 08/07/2015  . Osteoarthritis   . Other abnormal glucose   . Other and unspecified hyperlipidemia   . Personal history of colonic polyps   . Personal history of peptic ulcer disease   . Pulmonary embolism ([place])   . Unspecified disorder of bladder   . Unspecified essential hypertension   . Unspecified hypothyroidism   . Varicose veins    Past Surgical History:  Past Surgical History:  Procedure Laterality Date  . ABDOMINAL HYSTERECTOMY    . CHOLECYSTECTOMY    . ENDOVENOUS ABLATION SAPHENOUS VEIN W/ LASER  09-11-2012   left greater saphenous vein   by Curt Jews MD  . EYE SURGERY    . KNEE ARTHROSCOPY     right   . NASAL SEPTUM SURGERY    . SHOULDER SURGERY    . TOTAL KNEE ARTHROPLASTY  09/2013   Right      Social History:  reports that she quit smoking about 30 years ago. Her smoking use included cigarettes. She has never used smokeless tobacco. She reports current alcohol use of about 4.0 standard drinks of alcohol per week. She reports that she does not use drugs.  Family History: family history includes Aneurysm in her daughter; Cancer in her sister; Diabetes in her daughter; Heart attack in her father; Heart disease in her  brother, daughter, father, and sister; Hip fracture in her mother; Hyperlipidemia in her daughter and father; Hypertension in her daughter; Other in her mother.   HOME MEDICATIONS: Allergies as of 01/23/2019      Reactions   Simvastatin Other (See Comments)   Hip pain   Crestor [rosuvastatin]    Left hip pain   Lipitor [atorvastatin]    Pain in hips   Red Dye    Hip pain   Statins Other (See Comments)   Muscle aches, could not walk      Medication List       Accurate as of January 23, 2019  4:16 PM. Always use your most recent med list.         ALPRAZolam 0.5 MG tablet Commonly known as:  XANAX Take 1 tablet (0.5 mg total) by mouth at bedtime as needed for anxiety.   aspirin 81 MG tablet Take 1 tablet (81 mg total) by mouth daily.   co-enzyme Q-10 30 MG capsule Take 30 mg by mouth daily.   famotidine 40 MG tablet Commonly known as:  PEPCID Take 1 tablet (40 mg total) by mouth daily.   fenofibrate micronized 134 MG capsule Commonly known as:  LOFIBRA TAKE 1 CAPSULE DAILY   fexofenadine 180 MG tablet Commonly known as:  Allegra Allergy Take 1 tablet (180 mg total) by mouth daily.   fluticasone 110 MCG/ACT inhaler Commonly known as:  Flovent HFA Inhale 2 puffs into the lungs 2 (two) times daily.   fluticasone 50 MCG/ACT nasal spray Commonly known as:  FLONASE USE 2 SPRAYS IN EACH NOSTRIL DAILY AS NEEDED FOR ALLERGIES OR RHINITIS   furosemide 40 MG tablet Commonly known as:  LASIX TAKE 1 TABLET DAILY AS DIRECTED   gabapentin 300 MG capsule Commonly known as:  NEURONTIN Take 1 capsule (300 mg total) by mouth at bedtime.   ipratropium-albuterol 0.5-2.5 (3) MG/3ML Soln Commonly known as:  DUONEB Take 3 times a day for next 3 days and then as needed for wheezing.   levothyroxine 50 MCG tablet Commonly known as:  SYNTHROID, LEVOTHROID TAKE 1 TABLET DAILY BEFORE BREAKFAST   metoprolol succinate 25 MG 24 hr tablet Commonly known as:  TOPROL-XL Take 1 tablet (25 mg total) by mouth daily.   montelukast 10 MG tablet Commonly known as:  SINGULAIR TAKE 1 TABLET AT BEDTIME   Mucinex 600 MG 12 hr tablet Generic drug:  guaiFENesin Take 1,200 mg by mouth 2 (two) times daily as needed for to loosen phlegm.   pantoprazole 40 MG tablet Commonly known as:  PROTONIX Take 1 tablet (40 mg total) by mouth 2 (two) times daily.   Pataday 0.2 % Soln Generic drug:  Olopatadine HCl Place 1 drop into both eyes daily.   ProAir HFA 108 (90 Base) MCG/ACT inhaler Generic drug:  albuterol USE 2 INHALATIONS EVERY 6 HOURS AS  NEEDED FOR WHEEZING OR SHORTNESS OF BREATH   propranolol ER 60 MG 24 hr capsule Commonly known as:  INDERAL LA Take 1 capsule (60 mg total) by mouth daily.   sucralfate 1 g tablet Commonly known as:  CARAFATE Take 1 tablet (1 g total) by mouth 4 (four) times daily.   Uloric 40 MG tablet Generic drug:  febuxostat Take 1 tablet (40 mg total) by mouth daily. Brand Name Uloric         REVIEW OF SYSTEMS: A comprehensive ROS was conducted with the patient and is negative except as per HPI and below:  Review of  Systems  Constitutional: Positive for malaise/fatigue. Negative for weight loss.  HENT: Negative for congestion and sore throat.   Eyes: Negative for blurred vision and pain.  Respiratory: Positive for cough and shortness of breath.   Cardiovascular: Positive for leg swelling. Negative for palpitations.  Gastrointestinal: Negative for diarrhea and nausea.  Genitourinary: Positive for frequency.  Neurological: Negative for tingling and tremors.  Endo/Heme/Allergies: Negative for polydipsia.  Psychiatric/Behavioral: Negative for depression. The patient is not nervous/anxious.        OBJECTIVE:  VS: BP 124/62 (BP Location: Right Arm, Patient Position: Sitting, Cuff Size: Normal)   Pulse 76   Ht 5\' 1"  (1.549 m)   Wt 158 lb (71.7 kg)   SpO2 94%   BMI 29.85 kg/m    Wt Readings from Last 3 Encounters:  01/23/19 158 lb (71.7 kg)  01/09/19 159 lb (72.1 kg)  12/30/18 159 lb (72.1 kg)     EXAM: General: Pt appears well and is in NAD  Hydration: Well-hydrated with moist mucous membranes and good skin turgor  Eyes: External eye exam normal without stare, lid lag or exophthalmos.  EOM intact.   Ears, Nose, Throat: Hearing: Grossly intact bilaterally Dental: Good dentition  Throat: Clear without mass, erythema or exudate  Neck: General: Supple without adenopathy. Thyroid: Thyroid size normal.  No goiter or nodules appreciated. No thyroid bruit.  Lungs: Clear with good BS  bilat with no rales, rhonchi, or wheezes  Heart: Auscultation: RRR.  Abdomen: Normoactive bowel sounds, soft, nontender, without masses or organomegaly palpable  Extremities:  BL LE: No pretibial edema normal ROM and strength.  Skin: Hair: Texture and amount normal with gender appropriate distribution Skin Inspection: No rashes. Skin Palpation: Skin temperature, texture, and thickness normal to palpation  Neuro: Cranial nerves: II - XII grossly intact  Cerebellar: Normal coordination and movement; no tremor Motor: Normal strength throughout DTRs: 2+ and symmetric in UE without delay in relaxation phase  Mental Status: Judgment, insight: Intact Orientation: Oriented to time, place, and person Mood and affect: No depression, anxiety, or agitation     DATA REVIEWED:  Results for Christina Mckinney, Christina Mckinney (MRN [id number]) as of 01/25/2019 12:42  Ref. Range 01/23/2019 16:34  Sodium Latest Ref Range: 135 - 146 mmol/L 141  Potassium Latest Ref Range: 3.5 - 5.3 mmol/L 4.4  Chloride Latest Ref Range: 98 - 110 mmol/L 103  CO2 Latest Ref Range: 20 - 32 mmol/L 28  Glucose Latest Ref Range: 65 - 99 mg/dL 94  BUN Latest Ref Range: 7 - 25 mg/dL 27 (H)  Creatinine Latest Ref Range: 0.60 - 0.88 mg/dL 1.36 (H)  Calcium Latest Ref Range: 8.6 - 10.4 mg/dL 10.8 (H)  BUN/Creatinine Ratio Latest Ref Range: 6 - 22 (calc) 20  Vitamin D, 25-Hydroxy Latest Ref Range: 30 - 100 ng/mL 54    Results for Christina Mckinney, Christina Mckinney (MRN [id number]) as of 01/25/2019 12:42  Ref. Range 01/09/2019 14:41  PTH, Intact Latest Ref Range: 14 - 64 pg/mL 74 (H)     ASSESSMENT/PLAN/RECOMMENDATIONS:   1. Hypercalcemia:   - Pt is  Asymptomatic. - The cause of her hypercalcemia is not clear at this time, the picture is muddies by high vitamin D levels which contributes to hypercalcemia, the picture is also muddied by CKD III which could also contribute to elevated PTH.  - On today's labs her repeat corrected calcium is minimally  at 10.48 mg/dL  (reference max 10.40) , this has trended down from 11.3 mg/dL ( corrected 10.98 )  with normalizations of Vitamin D levels.  - PTH- pending at this time. If this is high, this confirms PTH- mediated hypercalcemia.  - Will proceed then with 24-hr urine collection for calcium creatinine ratio.  - In the meantime she was encouraged to stay hydrated  - She was also advised to avoid calcium supplements and to avoid low calcium diet - Pt encouraged to maintain normal dietary calcium intake in the form of 2-3 servings a day.     2. Hypervitaminosis D:  - She had stopped Vitamin D 6000 iu daily  ~ a month ago, repeat testing today, shows normalization of Vitamin D levels.    F/u 6 weeks  Addendum: Repeat labs show stable hypercalcemia, inappropriately normal PTH and normal Vitamin D. Discussed these results with pt on 3/16 @ 14:30. Will proceed with 24-hr urine collections.    Signed electronically by: Mack Guise, MD  [facility] Endocrin[facility]  [facility] [address] [place], [place] [address] Phone: [phone] FAX: [phone]   CC: Mosie Lukes, [address] [place] [address] Phone: [phone] Fax: [phone]   Return to Endocrinology clinic as below: Future Appointments  Date Time Provider [place]  02/05/2019  3:30 PM Mosie Lukes, MD [facility]  03/09/2019  2:40 PM Mosie Lukes, MD [facility]

## 2019-01-25 DIAGNOSIS — E213 Hyperparathyroidism, unspecified: Secondary | ICD-10-CM | POA: Insufficient documentation

## 2019-01-25 DIAGNOSIS — E673 Hypervitaminosis D: Secondary | ICD-10-CM | POA: Insufficient documentation

## 2019-01-26 LAB — BASIC METABOLIC PANEL
BUN/Creatinine Ratio: 20 (calc) (ref 6–22)
BUN: 27 mg/dL — ABNORMAL HIGH (ref 7–25)
CO2: 28 mmol/L (ref 20–32)
Calcium: 10.8 mg/dL — ABNORMAL HIGH (ref 8.6–10.4)
Chloride: 103 mmol/L (ref 98–110)
Creat: 1.36 mg/dL — ABNORMAL HIGH (ref 0.60–0.88)
Glucose, Bld: 94 mg/dL (ref 65–99)
POTASSIUM: 4.4 mmol/L (ref 3.5–5.3)
Sodium: 141 mmol/L (ref 135–146)

## 2019-01-26 LAB — ALBUMIN: ALBUMIN MSPROF: 4.4 g/dL (ref 3.6–5.1)

## 2019-01-26 LAB — PTH, INTACT AND CALCIUM
CALCIUM: 10.8 mg/dL — AB (ref 8.6–10.4)
PTH: 48 pg/mL (ref 14–64)

## 2019-01-26 LAB — VITAMIN D 25 HYDROXY (VIT D DEFICIENCY, FRACTURES): Vit D, 25-Hydroxy: 54 ng/mL (ref 30–100)

## 2019-01-29 ENCOUNTER — Other Ambulatory Visit: Payer: Medicare Other

## 2019-02-05 ENCOUNTER — Ambulatory Visit (INDEPENDENT_AMBULATORY_CARE_PROVIDER_SITE_OTHER): Payer: Medicare Other | Admitting: Family Medicine

## 2019-02-05 ENCOUNTER — Other Ambulatory Visit: Payer: Self-pay

## 2019-02-05 DIAGNOSIS — I10 Essential (primary) hypertension: Secondary | ICD-10-CM

## 2019-02-05 DIAGNOSIS — E039 Hypothyroidism, unspecified: Secondary | ICD-10-CM

## 2019-02-05 DIAGNOSIS — J0101 Acute recurrent maxillary sinusitis: Secondary | ICD-10-CM

## 2019-02-05 DIAGNOSIS — E119 Type 2 diabetes mellitus without complications: Secondary | ICD-10-CM

## 2019-02-05 MED ORDER — FUROSEMIDE 40 MG PO TABS: 40.0000 mg | ORAL_TABLET | Freq: Every day | ORAL | 4 refills | Status: DC

## 2019-02-05 MED ORDER — DOXYCYCLINE HYCLATE 100 MG PO TABS: 100.0000 mg | ORAL_TABLET | Freq: Two times a day (BID) | ORAL | 0 refills | Status: DC

## 2019-02-05 MED ORDER — BENZONATATE 100 MG PO CAPS: 100.0000 mg | ORAL_CAPSULE | Freq: Three times a day (TID) | ORAL | 1 refills | Status: DC | PRN

## 2019-02-07 NOTE — Assessment & Plan Note (Signed)
On Levothyroxine, continue to monitor 

## 2019-02-07 NOTE — Assessment & Plan Note (Signed)
hgba1c acceptable, minimize simple carbs. Increase exercise as tolerated. Continue current meds 

## 2019-02-07 NOTE — Assessment & Plan Note (Signed)
Symptoms improved some after Cefdinir treatment but have now increased again. Start Doxycycline and notify us if worsen

## 2019-02-07 NOTE — Progress Notes (Signed)
Virtual Visit via Telephone Note  I connected with Christina Mckinney on 02/07/19 at  3:30 PM EDT by telephone and verified that I am speaking with the correct person using two identifiers.   I discussed the limitations, risks, security and privacy concerns of performing an evaluation and management service by telephone and the availability of in person appointments. I also discussed with the patient that there may be a patient responsible charge related to this service. The patient expressed understanding and agreed to proceed. Christina CarterCMA in office tried to get patient set up on Webex and was unsuccessful so switched to telephone call   History of Present Illness: Patient is talked to from her home for hospital follow up. She notes feeling much better since her recent hospitalization. She notes recent treatment with Cefdinir for respiratory/sinus infection. She imporved some initially but is worsening again with congestion, hoarseness, cough noted. No fevers or chills. Denies CP/palp/SOB/HA/fevers/GI or GU c/o. Taking meds as prescribed    Observations/Objective: unable to perform due to non in person visit.   Assessment and Plan:  Diabetes mellitus without complication [id number] acceptable, minimize simple carbs. Increase exercise as tolerated. Continue current meds  Essential hypertension Patient is advised to monitor BP occasionally and let us know if any concerns. Continue current meds.   Hypothyroidism On Levothyroxine, continue to monitor  Acute recurrent maxillary sinusitis Symptoms improved some after Cefdinir treatment but have now increased again. Start Doxycycline and notify us if worsen    Follow Up Instructions:    I discussed the assessment and treatment plan with the patient. The patient was provided an opportunity to ask questions and all were answered. The patient agreed with the plan and demonstrated an understanding of the instructions.   The patient was advised  to call back or seek an in-person evaluation if the symptoms worsen or if the condition fails to improve as anticipated.  I provided 22 minutes of non-face-to-face time during this encounter.   Christina Homans, MD

## 2019-02-07 NOTE — Assessment & Plan Note (Signed)
Patient is advised to monitor BP occasionally and let us know if any concerns. Continue current meds.

## 2019-02-11 ENCOUNTER — Telehealth: Payer: Self-pay | Admitting: Family Medicine

## 2019-02-11 NOTE — Telephone Encounter (Signed)
[facility] left voicemail message to request a 90 day prescription for Metoprolol, Amlodipine and   ipratropium-albuterol (DUONEB) 0.5-2.5 (3) MG/3ML SOLN. Prescriptions can be sent in via escribe or faxed to [phone]. Contact info for pharmacy is [phone].

## 2019-02-12 MED ORDER — IPRATROPIUM-ALBUTEROL 0.5-2.5 (3) MG/3ML IN SOLN: RESPIRATORY_TRACT | 0 refills | Status: DC

## 2019-02-12 MED ORDER — METOPROLOL SUCCINATE ER 25 MG PO TB24: 25.0000 mg | ORAL_TABLET | Freq: Every day | ORAL | 3 refills | Status: DC

## 2019-02-12 NOTE — Telephone Encounter (Signed)
Medications sent in.

## 2019-02-27 ENCOUNTER — Telehealth: Payer: Self-pay | Admitting: Family Medicine

## 2019-02-27 NOTE — Telephone Encounter (Signed)
Patient called and left voicemail requesting to talk to Dr. Charlett Blake about going back on Cozaar.  Patient said the current medication she has been taking she is experiencing problems with it.  Please advise.  Thanks

## 2019-03-02 NOTE — Telephone Encounter (Signed)
Set her up with a virtual visit and we can discuss options based on her symptoms. The concern is there is now a shortage of Cozaar.

## 2019-03-02 NOTE — Telephone Encounter (Signed)
Please advise 

## 2019-03-03 ENCOUNTER — Other Ambulatory Visit: Payer: Self-pay

## 2019-03-03 ENCOUNTER — Ambulatory Visit (INDEPENDENT_AMBULATORY_CARE_PROVIDER_SITE_OTHER): Payer: Medicare Other | Admitting: Family Medicine

## 2019-03-03 DIAGNOSIS — R42 Dizziness and giddiness: Secondary | ICD-10-CM | POA: Diagnosis not present

## 2019-03-03 DIAGNOSIS — N289 Disorder of kidney and ureter, unspecified: Secondary | ICD-10-CM

## 2019-03-03 DIAGNOSIS — M109 Gout, unspecified: Secondary | ICD-10-CM

## 2019-03-03 DIAGNOSIS — N189 Chronic kidney disease, unspecified: Secondary | ICD-10-CM | POA: Diagnosis not present

## 2019-03-03 DIAGNOSIS — I10 Essential (primary) hypertension: Secondary | ICD-10-CM

## 2019-03-03 DIAGNOSIS — E119 Type 2 diabetes mellitus without complications: Secondary | ICD-10-CM

## 2019-03-03 MED ORDER — LOSARTAN POTASSIUM 100 MG PO TABS: 50.0000 mg | ORAL_TABLET | Freq: Every day | ORAL | 3 refills | Status: DC

## 2019-03-03 MED ORDER — MECLIZINE HCL 25 MG PO TABS: 25.0000 mg | ORAL_TABLET | Freq: Three times a day (TID) | ORAL | 0 refills | Status: DC | PRN

## 2019-03-03 NOTE — Assessment & Plan Note (Signed)
hgba1c acceptable, minimize simple carbs. Increase exercise as tolerated.  

## 2019-03-03 NOTE — Assessment & Plan Note (Signed)
Stable on Uloric continue to monitor

## 2019-03-03 NOTE — Assessment & Plan Note (Signed)
Worsening over past week. Feels it is related to Metoprolol so she stopped it and she is improved some. Can use Meclizine prn, hydrate better and notify us if no irmprovement. Seek care if worsens or new symptoms develop

## 2019-03-03 NOTE — Assessment & Plan Note (Signed)
Restart Losartan at 50 mg and recheck cmp in 4-6 weeks. She agrees to hydrate well

## 2019-03-03 NOTE — Progress Notes (Signed)
Virtual Visit via Video Note  I connected with Christina Mckinney on 03/03/19 at  1:00 PM EDT by a video enabled telemedicine application and verified that I am speaking with the correct person using two identifiers.   I discussed the limitations of evaluation and management by telemedicine and the availability of in person appointments. The patient expressed understanding and agreed to proceed. Christina Mckinney CMA was able to get patient and her son set up with video visit platform.    Subjective:    Patient ID: Christina Mckinney, female    DOB: 01/19/1932, 85 y.o.   MRN: [id number]  No chief complaint on file.   HPI Patient is in today for evaluation of Vertigo. She struggles with a regular mild sense of disequilibrium but over the past week it has gotten worse. She is using a walker in the house but still bouncing off walls. She denies any head trauma or falls, no vision or hearing changes. No nausea or vomiting. She blames it on her Metoprolol and she stopped it today and she feels the symptoms are some improved. No recent febrile illness or hospitalizations. Denies CP/palp/SOB/HA/congestion/fevers/GI or GU c/o. Taking meds as prescribed  Past Medical History:  Diagnosis Date  . Anemia, unspecified   . Anxiety and depression 09/18/2015  . Anxiety state, unspecified   . Chest pain   . Chronic systolic CHF (congestive heart failure) ([place]) 08/12/2015  . Depression with anxiety 06/02/2007   Qualifier: Diagnosis of  By: Wynona Luna   . Depressive disorder, not elsewhere classified   . Diverticulitis   . DVT (deep venous thrombosis) ([place])   . Dyspnea 07/22/2015  . Edema 06/12/2014  . Essential and other specified forms of tremor   . GERD (gastroesophageal reflux disease)   . Gout, unspecified   . Internal hemorrhoids without mention of complication   . Macular degeneration 08/07/2015  . Osteoarthritis   . Other abnormal glucose   . Other and unspecified hyperlipidemia   . Personal history  of colonic polyps   . Personal history of peptic ulcer disease   . Pulmonary embolism ([place])   . Unspecified disorder of bladder   . Unspecified essential hypertension   . Unspecified hypothyroidism   . Varicose veins     Past Surgical History:  Procedure Laterality Date  . ABDOMINAL HYSTERECTOMY    . CHOLECYSTECTOMY    . ENDOVENOUS ABLATION SAPHENOUS VEIN W/ LASER  09-11-2012   left greater saphenous vein   by Curt Jews MD  . EYE SURGERY    . KNEE ARTHROSCOPY     right   . NASAL SEPTUM SURGERY    . SHOULDER SURGERY    . TOTAL KNEE ARTHROPLASTY  09/2013   Right    Family History  Problem Relation Age of Onset  . Other Mother        varicose veins  . Hip fracture Mother   . Heart disease Father        CHF  . Hyperlipidemia Father   . Heart attack Father   . Diabetes Daughter   . Heart disease Daughter   . Hyperlipidemia Daughter   . Hypertension Daughter   . Aneurysm Daughter   . Cancer Sister        stomach  . Heart disease Sister   . Heart disease Brother   . Colon cancer Neg Hx     Social History   Socioeconomic History  . Marital status: Widowed    Spouse  name: Not on file  . Number of children: 3  . Years of education: Not on file  . Highest education level: Not on file  Occupational History  . Occupation: Retired   Scientific laboratory technician  . Financial resource strain: Not on file  . Food insecurity:    Worry: Not on file    Inability: Not on file  . Transportation needs:    Medical: Not on file    Non-medical: Not on file  Tobacco Use  . Smoking status: Former Smoker    Types: Cigarettes    Last attempt to quit: 11/12/1988    Years since quitting: 30.3  . Smokeless tobacco: Never Used  Substance and Sexual Activity  . Alcohol use: Yes    Alcohol/week: 4.0 standard drinks    Types: 4 Standard drinks or equivalent per week    Comment: occasional wine   . Drug use: No  . Sexual activity: Never    Comment: lives alone, no dietary restrictions, widowed   Lifestyle  . Physical activity:    Days per week: Not on file    Minutes per session: Not on file  . Stress: Not on file  Relationships  . Social connections:    Talks on phone: Not on file    Gets together: Not on file    Attends religious service: Not on file    Active member of club or organization: Not on file    Attends meetings of clubs or organizations: Not on file    Relationship status: Not on file  . Intimate partner violence:    Fear of current or ex partner: Not on file    Emotionally abused: Not on file    Physically abused: Not on file    Forced sexual activity: Not on file  Other Topics Concern  . Not on file  Social History Narrative  . Not on file    Outpatient Medications Prior to Visit  Medication Sig Dispense Refill  . ALPRAZolam (XANAX) 0.5 MG tablet Take 1 tablet (0.5 mg total) by mouth at bedtime as needed for anxiety. 90 tablet 1  . aspirin 81 MG tablet Take 1 tablet (81 mg total) by mouth daily. 30 tablet   . co-enzyme Q-10 30 MG capsule Take 30 mg by mouth daily.     . famotidine (PEPCID) 40 MG tablet Take 1 tablet (40 mg total) by mouth daily. 90 tablet 1  . fenofibrate micronized (LOFIBRA) 134 MG capsule TAKE 1 CAPSULE DAILY 90 capsule 50  . fexofenadine (ALLEGRA ALLERGY) 180 MG tablet Take 1 tablet (180 mg total) by mouth daily. 90 tablet 1  . fluticasone (FLONASE) 50 MCG/ACT nasal spray USE 2 SPRAYS IN EACH NOSTRIL DAILY AS NEEDED FOR ALLERGIES OR RHINITIS (Patient taking differently: Place 2 sprays into both nostrils daily. USE 2 SPRAYS IN EACH NOSTRIL DAILY AS NEEDED FOR ALLERGIES OR RHINITIS) 48 g 1  . fluticasone (FLOVENT HFA) 110 MCG/ACT inhaler Inhale 2 puffs into the lungs 2 (two) times daily. 3 Inhaler 3  . furosemide (LASIX) 40 MG tablet Take 1 tablet (40 mg total) by mouth daily. as directed 90 tablet 4  . gabapentin (NEURONTIN) 300 MG capsule Take 1 capsule (300 mg total) by mouth at bedtime. 90 capsule 1  . guaiFENesin (MUCINEX) 600 MG 12  hr tablet Take 1,200 mg by mouth 2 (two) times daily as needed for to loosen phlegm.     Marland Kitchen ipratropium-albuterol (DUONEB) 0.5-2.5 (3) MG/3ML SOLN Take 3 times a  day for next 3 days and then as needed for wheezing. 360 mL 0  . levothyroxine (SYNTHROID, LEVOTHROID) 50 MCG tablet TAKE 1 TABLET DAILY BEFORE BREAKFAST 90 tablet 4  . montelukast (SINGULAIR) 10 MG tablet TAKE 1 TABLET AT BEDTIME 90 tablet 4  . pantoprazole (PROTONIX) 40 MG tablet Take 1 tablet (40 mg total) by mouth 2 (two) times daily. 180 tablet 5  . PATADAY 0.2 % SOLN Place 1 drop into both eyes daily.    Marland Kitchen PROAIR HFA 108 (90 Base) MCG/ACT inhaler USE 2 INHALATIONS EVERY 6 HOURS AS NEEDED FOR WHEEZING OR SHORTNESS OF BREATH (Patient taking differently: Inhale 2 puffs into the lungs every 6 (six) hours as needed for wheezing or shortness of breath. ) 25.5 g 1  . propranolol ER (INDERAL LA) 60 MG 24 hr capsule Take 1 capsule (60 mg total) by mouth daily. 90 capsule 1  . sucralfate (CARAFATE) 1 g tablet Take 1 tablet (1 g total) by mouth 4 (four) times daily. 360 tablet 0  . ULORIC 40 MG tablet Take 1 tablet (40 mg total) by mouth daily. Brand Name Uloric 90 tablet 1  . benzonatate (TESSALON) 100 MG capsule Take 1 capsule (100 mg total) by mouth 3 (three) times daily as needed for cough. 30 capsule 1  . doxycycline (VIBRA-TABS) 100 MG tablet Take 1 tablet (100 mg total) by mouth 2 (two) times daily. 20 tablet 0  . metoprolol succinate (TOPROL-XL) 25 MG 24 hr tablet Take 1 tablet (25 mg total) by mouth daily. 90 tablet 3   No facility-administered medications prior to visit.     Allergies  Allergen Reactions  . Simvastatin Other (See Comments)    Hip pain  . Crestor [Rosuvastatin]     Left hip pain  . Lipitor [Atorvastatin]     Pain in hips  . Red Dye     Hip pain  . Statins Other (See Comments)    Muscle aches, could not walk    Review of Systems  Constitutional: Negative for fever and malaise/fatigue.  HENT: Negative for  congestion, ear discharge, ear pain, hearing loss and tinnitus.   Eyes: Negative for blurred vision.  Respiratory: Negative for shortness of breath.   Cardiovascular: Negative for chest pain, palpitations and leg swelling.  Gastrointestinal: Negative for abdominal pain, blood in stool and nausea.  Genitourinary: Negative for dysuria and frequency.  Musculoskeletal: Negative for falls.  Skin: Negative for rash.  Neurological: Positive for dizziness. Negative for sensory change, focal weakness, loss of consciousness and headaches.  Endo/Heme/Allergies: Negative for environmental allergies.  Psychiatric/Behavioral: Negative for depression. The patient is not nervous/anxious.        Objective:    Physical Exam Constitutional:      Appearance: Normal appearance. She is not ill-appearing.  HENT:     Head: Normocephalic and atraumatic.  Pulmonary:     Effort: Pulmonary effort is normal.  Neurological:     Mental Status: She is alert and oriented to person, place, and time.  Psychiatric:        Mood and Affect: Mood normal.        Behavior: Behavior normal.     BP 135/85 (BP Location: Left Arm, Patient Position: Sitting, Cuff Size: Normal)   Wt 158 lb (71.7 kg)   BMI 29.85 kg/m  Wt Readings from Last 3 Encounters:  03/03/19 158 lb (71.7 kg)  02/05/19 145 lb (65.8 kg)  01/23/19 158 lb (71.7 kg)    Diabetic Foot Exam -  Simple   No data filed     Lab Results  Component Value Date   WBC 5.9 01/09/2019   HGB 11.5 (L) 01/09/2019   HCT 34.1 (L) 01/09/2019   PLT 226 01/09/2019   GLUCOSE 94 01/23/2019   CHOL 221 (H) 09/04/2018   TRIG 143.0 09/04/2018   HDL 57.80 09/04/2018   LDLDIRECT 96.0 04/22/2017   LDLCALC 135 (H) 09/04/2018   ALT 9 01/09/2019   AST 35 01/09/2019   NA 141 01/23/2019   K 4.4 01/23/2019   CL 103 01/23/2019   CREATININE 1.36 (H) 01/23/2019   BUN 27 (H) 01/23/2019   CO2 28 01/23/2019   TSH 3.327 12/19/2018   INR 1.0 07/21/2010   HGBA1C 5.5 03/17/2018    MICROALBUR 0.9 01/31/2016    Lab Results  Component Value Date   TSH 3.327 12/19/2018   Lab Results  Component Value Date   WBC 5.9 01/09/2019   HGB 11.5 (L) 01/09/2019   HCT 34.1 (L) 01/09/2019   MCV 94.5 01/09/2019   PLT 226 01/09/2019   Lab Results  Component Value Date   NA 141 01/23/2019   K 4.4 01/23/2019   CO2 28 01/23/2019   GLUCOSE 94 01/23/2019   BUN 27 (H) 01/23/2019   CREATININE 1.36 (H) 01/23/2019   BILITOT 0.5 01/09/2019   ALKPHOS 68 12/30/2018   AST 35 01/09/2019   ALT 9 01/09/2019   PROT 6.7 01/09/2019   ALBUMIN 4.4 12/30/2018   CALCIUM 10.8 (H) 01/23/2019   CALCIUM 10.8 (H) 01/23/2019   ANIONGAP 9 12/21/2018   GFR 28.00 (L) 12/30/2018   Lab Results  Component Value Date   CHOL 221 (H) 09/04/2018   Lab Results  Component Value Date   HDL 57.80 09/04/2018   Lab Results  Component Value Date   LDLCALC 135 (H) 09/04/2018   Lab Results  Component Value Date   TRIG 143.0 09/04/2018   Lab Results  Component Value Date   CHOLHDL 4 09/04/2018   Lab Results  Component Value Date   HGBA1C 5.5 03/17/2018       Assessment & Plan:   Problem List Items Addressed This Visit    Gout    Stable on Uloric continue to monitor      Essential hypertension    Restart Losartan at 50 mg and recheck cmp in 4-6 weeks. She agrees to hydrate well       Relevant Medications   losartan (COZAAR) 100 MG tablet   Diabetes mellitus without complication (HCC)    [id number] acceptable, minimize simple carbs. Increase exercise as tolerated.      Relevant Medications   losartan (COZAAR) 100 MG tablet   Vertigo    Worsening over past week. Feels it is related to Metoprolol so she stopped it and she is improved some. Can use Meclizine prn, hydrate better and notify us if no irmprovement. Seek care if worsens or new symptoms develop      CRI (chronic renal insufficiency)    Stable. She is encouraged to hydrate well and we will repeat CMP next month          I have discontinued Chad S. Pettinger's benzonatate, doxycycline, and metoprolol succinate. I am also having her start on losartan and meclizine. Additionally, I am having her maintain her co-enzyme Q-10, aspirin, Pataday, ProAir HFA, sucralfate, fenofibrate micronized, fluticasone, montelukast, levothyroxine, Uloric, ALPRAZolam, fluticasone, propranolol ER, fexofenadine, gabapentin, famotidine, pantoprazole, guaiFENesin, furosemide, and ipratropium-albuterol.  Meds ordered this encounter  Medications  .  losartan (COZAAR) 100 MG tablet    Sig: Take 0.5 tablets (50 mg total) by mouth daily.    Dispense:  90 tablet    Refill:  3  . meclizine (ANTIVERT) 25 MG tablet    Sig: Take 1 tablet (25 mg total) by mouth 3 (three) times daily as needed for dizziness.    Dispense:  30 tablet    Refill:  0    I discussed the assessment and treatment plan with the patient. The patient was provided an opportunity to ask questions and all were answered. The patient agreed with the plan and demonstrated an understanding of the instructions.   The patient was advised to call back or seek an in-person evaluation if the symptoms worsen or if the condition fails to improve as anticipated.  I provided non-face-to-face time during this encounter.   [name], MD

## 2019-03-03 NOTE — Assessment & Plan Note (Signed)
Stable. She is encouraged to hydrate well and we will repeat CMP next month

## 2019-03-03 NOTE — Telephone Encounter (Signed)
Patient set up with PCP

## 2019-03-09 ENCOUNTER — Encounter: Payer: Medicare Other | Admitting: Family Medicine

## 2019-03-10 ENCOUNTER — Ambulatory Visit: Payer: Medicare Other | Admitting: Internal Medicine

## 2019-03-12 ENCOUNTER — Ambulatory Visit: Payer: Medicare Other | Admitting: Family Medicine

## 2019-03-12 DIAGNOSIS — R42 Dizziness and giddiness: Secondary | ICD-10-CM | POA: Diagnosis not present

## 2019-03-12 DIAGNOSIS — I951 Orthostatic hypotension: Secondary | ICD-10-CM | POA: Diagnosis not present

## 2019-03-12 DIAGNOSIS — H8113 Benign paroxysmal vertigo, bilateral: Secondary | ICD-10-CM | POA: Diagnosis not present

## 2019-03-13 ENCOUNTER — Telehealth: Payer: Self-pay | Admitting: *Deleted

## 2019-03-13 ENCOUNTER — Ambulatory Visit: Payer: Medicare Other | Admitting: Family Medicine

## 2019-03-13 ENCOUNTER — Other Ambulatory Visit: Payer: Self-pay

## 2019-03-13 ENCOUNTER — Ambulatory Visit (INDEPENDENT_AMBULATORY_CARE_PROVIDER_SITE_OTHER): Payer: Medicare Other | Admitting: Family Medicine

## 2019-03-13 DIAGNOSIS — I10 Essential (primary) hypertension: Secondary | ICD-10-CM

## 2019-03-13 DIAGNOSIS — N189 Chronic kidney disease, unspecified: Secondary | ICD-10-CM | POA: Diagnosis not present

## 2019-03-13 DIAGNOSIS — E673 Hypervitaminosis D: Secondary | ICD-10-CM | POA: Diagnosis not present

## 2019-03-13 DIAGNOSIS — E119 Type 2 diabetes mellitus without complications: Secondary | ICD-10-CM

## 2019-03-13 DIAGNOSIS — E559 Vitamin D deficiency, unspecified: Secondary | ICD-10-CM

## 2019-03-13 MED ORDER — LOSARTAN POTASSIUM 50 MG PO TABS: 50.0000 mg | ORAL_TABLET | Freq: Every day | ORAL | 1 refills | Status: DC

## 2019-03-13 MED ORDER — GABAPENTIN 300 MG PO CAPS: 300.0000 mg | ORAL_CAPSULE | Freq: Every day | ORAL | 1 refills | Status: DC

## 2019-03-13 NOTE — Telephone Encounter (Signed)
Received Physician Orders from [place]; forwarded to provider/SLS 05/01

## 2019-03-15 NOTE — Assessment & Plan Note (Signed)
Avoid supplements and check labs with next visit.

## 2019-03-15 NOTE — Assessment & Plan Note (Signed)
hgba1c acceptable, minimize simple carbs. Increase exercise as tolerated. Continue current meds 

## 2019-03-15 NOTE — Assessment & Plan Note (Signed)
no changes to meds. Encouraged heart healthy diet such as the DASH diet and exercise as tolerated.  

## 2019-03-15 NOTE — Assessment & Plan Note (Signed)
Hydrate and monitor 

## 2019-03-15 NOTE — Progress Notes (Signed)
Virtual Visit via Telephone Note  I connected with Christina Mckinney on 03/15/19 at  9:40 AM EDT by telephone and verified that I am speaking with the correct person using two identifiers.  Location: Patient: home Provider: home   I discussed the limitations, risks, security and privacy concerns of performing an evaluation and management service by telephone and the availability of in person appointments. I also discussed with the patient that there may be a patient responsible charge related to this service. The patient expressed understanding and agreed to proceed. Magdalene Molly, CMA was able to get the patient set up on a phone call once she was unable to perform a video visit    Subjective:    Patient ID: Christina Mckinney, female    DOB: May 24, 1932, 85 y.o.   MRN: [id number]  No chief complaint on file.   HPI Patient is in today for follow upo n chronic medical concerns including hypothyroidism, hypertension, reflux, gout and more. She denies any recent febrile illness or hospitalizations. No recent fall or injury. No polyuria or polydipsia. Denies CP/palp/HA/congestion/fevers/GI or GU c/o. Taking meds as prescribed  Past Medical History:  Diagnosis Date  . Anemia, unspecified   . Anxiety and depression 09/18/2015  . Anxiety state, unspecified   . Chest pain   . Chronic systolic CHF (congestive heart failure) ([place]) 08/12/2015  . Depression with anxiety 06/02/2007   Qualifier: Diagnosis of  By: Wynona Luna   . Depressive disorder, not elsewhere classified   . Diverticulitis   . DVT (deep venous thrombosis) ([place])   . Dyspnea 07/22/2015  . Edema 06/12/2014  . Essential and other specified forms of tremor   . GERD (gastroesophageal reflux disease)   . Gout, unspecified   . Internal hemorrhoids without mention of complication   . Macular degeneration 08/07/2015  . Osteoarthritis   . Other abnormal glucose   . Other and unspecified hyperlipidemia   . Personal history of colonic  polyps   . Personal history of peptic ulcer disease   . Pulmonary embolism ([place])   . Unspecified disorder of bladder   . Unspecified essential hypertension   . Unspecified hypothyroidism   . Varicose veins     Past Surgical History:  Procedure Laterality Date  . ABDOMINAL HYSTERECTOMY    . CHOLECYSTECTOMY    . ENDOVENOUS ABLATION SAPHENOUS VEIN W/ LASER  09-11-2012   left greater saphenous vein   by Curt Jews MD  . EYE SURGERY    . KNEE ARTHROSCOPY     right   . NASAL SEPTUM SURGERY    . SHOULDER SURGERY    . TOTAL KNEE ARTHROPLASTY  09/2013   Right    Family History  Problem Relation Age of Onset  . Other Mother        varicose veins  . Hip fracture Mother   . Heart disease Father        CHF  . Hyperlipidemia Father   . Heart attack Father   . Diabetes Daughter   . Heart disease Daughter   . Hyperlipidemia Daughter   . Hypertension Daughter   . Aneurysm Daughter   . Cancer Sister        stomach  . Heart disease Sister   . Heart disease Brother   . Colon cancer Neg Hx     Social History   Socioeconomic History  . Marital status: Widowed    Spouse name: Not on file  . Number of children: 3  .  Years of education: Not on file  . Highest education level: Not on file  Occupational History  . Occupation: Retired   Scientific laboratory technician  . Financial resource strain: Not on file  . Food insecurity:    Worry: Not on file    Inability: Not on file  . Transportation needs:    Medical: Not on file    Non-medical: Not on file  Tobacco Use  . Smoking status: Former Smoker    Types: Cigarettes    Last attempt to quit: 11/12/1988    Years since quitting: 30.3  . Smokeless tobacco: Never Used  Substance and Sexual Activity  . Alcohol use: Yes    Alcohol/week: 4.0 standard drinks    Types: 4 Standard drinks or equivalent per week    Comment: occasional wine   . Drug use: No  . Sexual activity: Never    Comment: lives alone, no dietary restrictions, widowed  Lifestyle   . Physical activity:    Days per week: Not on file    Minutes per session: Not on file  . Stress: Not on file  Relationships  . Social connections:    Talks on phone: Not on file    Gets together: Not on file    Attends religious service: Not on file    Active member of club or organization: Not on file    Attends meetings of clubs or organizations: Not on file    Relationship status: Not on file  . Intimate partner violence:    Fear of current or ex partner: Not on file    Emotionally abused: Not on file    Physically abused: Not on file    Forced sexual activity: Not on file  Other Topics Concern  . Not on file  Social History Narrative  . Not on file    Outpatient Medications Prior to Visit  Medication Sig Dispense Refill  . ALPRAZolam (XANAX) 0.5 MG tablet Take 1 tablet (0.5 mg total) by mouth at bedtime as needed for anxiety. 90 tablet 1  . aspirin 81 MG tablet Take 1 tablet (81 mg total) by mouth daily. 30 tablet   . co-enzyme Q-10 30 MG capsule Take 30 mg by mouth daily.     . famotidine (PEPCID) 40 MG tablet Take 1 tablet (40 mg total) by mouth daily. 90 tablet 1  . fenofibrate micronized (LOFIBRA) 134 MG capsule TAKE 1 CAPSULE DAILY 90 capsule 50  . fexofenadine (ALLEGRA ALLERGY) 180 MG tablet Take 1 tablet (180 mg total) by mouth daily. 90 tablet 1  . fluticasone (FLONASE) 50 MCG/ACT nasal spray USE 2 SPRAYS IN EACH NOSTRIL DAILY AS NEEDED FOR ALLERGIES OR RHINITIS (Patient taking differently: Place 2 sprays into both nostrils daily. USE 2 SPRAYS IN EACH NOSTRIL DAILY AS NEEDED FOR ALLERGIES OR RHINITIS) 48 g 1  . fluticasone (FLOVENT HFA) 110 MCG/ACT inhaler Inhale 2 puffs into the lungs 2 (two) times daily. 3 Inhaler 3  . furosemide (LASIX) 40 MG tablet Take 1 tablet (40 mg total) by mouth daily. as directed 90 tablet 4  . guaiFENesin (MUCINEX) 600 MG 12 hr tablet Take 1,200 mg by mouth 2 (two) times daily as needed for to loosen phlegm.     Marland Kitchen ipratropium-albuterol  (DUONEB) 0.5-2.5 (3) MG/3ML SOLN Take 3 times a day for next 3 days and then as needed for wheezing. 360 mL 0  . levothyroxine (SYNTHROID, LEVOTHROID) 50 MCG tablet TAKE 1 TABLET DAILY BEFORE BREAKFAST 90 tablet 4  .  meclizine (ANTIVERT) 25 MG tablet Take 1 tablet (25 mg total) by mouth 3 (three) times daily as needed for dizziness. 30 tablet 0  . montelukast (SINGULAIR) 10 MG tablet TAKE 1 TABLET AT BEDTIME 90 tablet 4  . pantoprazole (PROTONIX) 40 MG tablet Take 1 tablet (40 mg total) by mouth 2 (two) times daily. 180 tablet 5  . PATADAY 0.2 % SOLN Place 1 drop into both eyes daily.    Marland Kitchen PROAIR HFA 108 (90 Base) MCG/ACT inhaler USE 2 INHALATIONS EVERY 6 HOURS AS NEEDED FOR WHEEZING OR SHORTNESS OF BREATH (Patient taking differently: Inhale 2 puffs into the lungs every 6 (six) hours as needed for wheezing or shortness of breath. ) 25.5 g 1  . propranolol ER (INDERAL LA) 60 MG 24 hr capsule Take 1 capsule (60 mg total) by mouth daily. 90 capsule 1  . sucralfate (CARAFATE) 1 g tablet Take 1 tablet (1 g total) by mouth 4 (four) times daily. 360 tablet 0  . ULORIC 40 MG tablet Take 1 tablet (40 mg total) by mouth daily. Brand Name Uloric 90 tablet 1  . gabapentin (NEURONTIN) 300 MG capsule Take 1 capsule (300 mg total) by mouth at bedtime. 90 capsule 1  . losartan (COZAAR) 100 MG tablet Take 0.5 tablets (50 mg total) by mouth daily. 90 tablet 3   No facility-administered medications prior to visit.     Allergies  Allergen Reactions  . Simvastatin Other (See Comments)    Hip pain  . Crestor [Rosuvastatin]     Left hip pain  . Lipitor [Atorvastatin]     Pain in hips  . Red Dye     Hip pain  . Statins Other (See Comments)    Muscle aches, could not walk    Review of Systems  Constitutional: Positive for malaise/fatigue. Negative for fever.  HENT: Negative for congestion.   Eyes: Negative for blurred vision.  Respiratory: Negative for shortness of breath.   Cardiovascular: Negative for  chest pain, palpitations and leg swelling.  Gastrointestinal: Negative for abdominal pain, blood in stool and nausea.  Genitourinary: Negative for dysuria and frequency.  Musculoskeletal: Negative for falls.  Skin: Negative for rash.  Neurological: Negative for dizziness, loss of consciousness and headaches.  Endo/Heme/Allergies: Negative for environmental allergies.  Psychiatric/Behavioral: Negative for depression. The patient is nervous/anxious.        Objective:    Physical Exam unable to perform via telephone encounter.   There were no vitals taken for this visit. Wt Readings from Last 3 Encounters:  03/03/19 158 lb (71.7 kg)  02/05/19 145 lb (65.8 kg)  01/23/19 158 lb (71.7 kg)    Diabetic Foot Exam - Simple   No data filed     Lab Results  Component Value Date   WBC 5.9 01/09/2019   HGB 11.5 (L) 01/09/2019   HCT 34.1 (L) 01/09/2019   PLT 226 01/09/2019   GLUCOSE 94 01/23/2019   CHOL 221 (H) 09/04/2018   TRIG 143.0 09/04/2018   HDL 57.80 09/04/2018   LDLDIRECT 96.0 04/22/2017   LDLCALC 135 (H) 09/04/2018   ALT 9 01/09/2019   AST 35 01/09/2019   NA 141 01/23/2019   K 4.4 01/23/2019   CL 103 01/23/2019   CREATININE 1.36 (H) 01/23/2019   BUN 27 (H) 01/23/2019   CO2 28 01/23/2019   TSH 3.327 12/19/2018   INR 1.0 07/21/2010   HGBA1C 5.5 03/17/2018   MICROALBUR 0.9 01/31/2016    Lab Results  Component Value  Date   TSH 3.327 12/19/2018   Lab Results  Component Value Date   WBC 5.9 01/09/2019   HGB 11.5 (L) 01/09/2019   HCT 34.1 (L) 01/09/2019   MCV 94.5 01/09/2019   PLT 226 01/09/2019   Lab Results  Component Value Date   NA 141 01/23/2019   K 4.4 01/23/2019   CO2 28 01/23/2019   GLUCOSE 94 01/23/2019   BUN 27 (H) 01/23/2019   CREATININE 1.36 (H) 01/23/2019   BILITOT 0.5 01/09/2019   ALKPHOS 68 12/30/2018   AST 35 01/09/2019   ALT 9 01/09/2019   PROT 6.7 01/09/2019   ALBUMIN 4.4 12/30/2018   CALCIUM 10.8 (H) 01/23/2019   CALCIUM 10.8 (H)  01/23/2019   ANIONGAP 9 12/21/2018   GFR 28.00 (L) 12/30/2018   Lab Results  Component Value Date   CHOL 221 (H) 09/04/2018   Lab Results  Component Value Date   HDL 57.80 09/04/2018   Lab Results  Component Value Date   LDLCALC 135 (H) 09/04/2018   Lab Results  Component Value Date   TRIG 143.0 09/04/2018   Lab Results  Component Value Date   CHOLHDL 4 09/04/2018   Lab Results  Component Value Date   HGBA1C 5.5 03/17/2018       Assessment & Plan:   Problem List Items Addressed This Visit    Essential hypertension     no changes to meds. Encouraged heart healthy diet such as the DASH diet and exercise as tolerated.       Relevant Medications   losartan (COZAAR) 50 MG tablet   Other Relevant Orders   Hemoglobin A1c   Diabetes mellitus without complication (HCC)    CHYI5O acceptable, minimize simple carbs. Increase exercise as tolerated. Continue current meds      Relevant Medications   losartan (COZAAR) 50 MG tablet   Other Relevant Orders   Hemoglobin A1c   High vitamin D level    Avoid supplements and check labs with next visit.       Hypercalcemia - Primary   Relevant Orders   VITAMIN D 25 Hydroxy (Vit-D Deficiency, Fractures)   CRI (chronic renal insufficiency)    Hydrate and monitor       Other Visit Diagnoses    Vitamin D deficiency       Relevant Orders   VITAMIN D 25 Hydroxy (Vit-D Deficiency, Fractures)      I have discontinued Emmabelle S. Whicker's losartan. I am also having her start on losartan. Additionally, I am having her maintain her co-enzyme Q-10, aspirin, [place], ProAir HFA, sucralfate, fenofibrate micronized, fluticasone, montelukast, levothyroxine, Uloric, ALPRAZolam, fluticasone, propranolol ER, fexofenadine, famotidine, pantoprazole, guaiFENesin, furosemide, ipratropium-albuterol, meclizine, and gabapentin.  Meds ordered this encounter  Medications  . losartan (COZAAR) 50 MG tablet    Sig: Take 1 tablet (50 mg total) by mouth  daily.    Dispense:  90 tablet    Refill:  1  . gabapentin (NEURONTIN) 300 MG capsule    Sig: Take 1 capsule (300 mg total) by mouth at bedtime.    Dispense:  90 capsule    Refill:  1     I discussed the assessment and treatment plan with the patient. The patient was provided an opportunity to ask questions and all were answered. The patient agreed with the plan and demonstrated an understanding of the instructions.   The patient was advised to call back or seek an in-person evaluation if the symptoms worsen or if the condition  fails to improve as anticipated.  I provided 25 minutes of non-face-to-face time during this encounter.   [name], MD

## 2019-03-18 ENCOUNTER — Telehealth: Payer: Self-pay

## 2019-03-18 NOTE — Telephone Encounter (Signed)
Patient declined to reschedule her missed appointment at this time-she stated she will discuss test with her PCP and call us back when she is ready to reschedule

## 2019-03-19 DIAGNOSIS — R42 Dizziness and giddiness: Secondary | ICD-10-CM | POA: Diagnosis not present

## 2019-03-19 DIAGNOSIS — I951 Orthostatic hypotension: Secondary | ICD-10-CM | POA: Diagnosis not present

## 2019-03-19 DIAGNOSIS — H8113 Benign paroxysmal vertigo, bilateral: Secondary | ICD-10-CM | POA: Diagnosis not present

## 2019-03-24 DIAGNOSIS — I951 Orthostatic hypotension: Secondary | ICD-10-CM | POA: Diagnosis not present

## 2019-03-24 DIAGNOSIS — H8113 Benign paroxysmal vertigo, bilateral: Secondary | ICD-10-CM | POA: Diagnosis not present

## 2019-03-24 DIAGNOSIS — R42 Dizziness and giddiness: Secondary | ICD-10-CM | POA: Diagnosis not present

## 2019-04-01 DIAGNOSIS — R42 Dizziness and giddiness: Secondary | ICD-10-CM | POA: Diagnosis not present

## 2019-04-01 DIAGNOSIS — I951 Orthostatic hypotension: Secondary | ICD-10-CM | POA: Diagnosis not present

## 2019-04-01 DIAGNOSIS — H8113 Benign paroxysmal vertigo, bilateral: Secondary | ICD-10-CM | POA: Diagnosis not present

## 2019-04-08 ENCOUNTER — Other Ambulatory Visit: Payer: Medicare Other

## 2019-04-08 DIAGNOSIS — H8113 Benign paroxysmal vertigo, bilateral: Secondary | ICD-10-CM | POA: Diagnosis not present

## 2019-04-08 DIAGNOSIS — R42 Dizziness and giddiness: Secondary | ICD-10-CM | POA: Diagnosis not present

## 2019-04-08 DIAGNOSIS — I951 Orthostatic hypotension: Secondary | ICD-10-CM | POA: Diagnosis not present

## 2019-04-09 ENCOUNTER — Ambulatory Visit (INDEPENDENT_AMBULATORY_CARE_PROVIDER_SITE_OTHER): Payer: Medicare Other | Admitting: Family Medicine

## 2019-04-09 ENCOUNTER — Other Ambulatory Visit: Payer: Self-pay

## 2019-04-09 VITALS — BP 108/62 | HR 79

## 2019-04-09 DIAGNOSIS — E119 Type 2 diabetes mellitus without complications: Secondary | ICD-10-CM

## 2019-04-09 DIAGNOSIS — I10 Essential (primary) hypertension: Secondary | ICD-10-CM

## 2019-04-09 DIAGNOSIS — H8110 Benign paroxysmal vertigo, unspecified ear: Secondary | ICD-10-CM

## 2019-04-09 DIAGNOSIS — R05 Cough: Secondary | ICD-10-CM | POA: Diagnosis not present

## 2019-04-09 DIAGNOSIS — J90 Pleural effusion, not elsewhere classified: Secondary | ICD-10-CM

## 2019-04-09 DIAGNOSIS — R0602 Shortness of breath: Secondary | ICD-10-CM | POA: Diagnosis not present

## 2019-04-09 DIAGNOSIS — E673 Hypervitaminosis D: Secondary | ICD-10-CM | POA: Diagnosis not present

## 2019-04-09 DIAGNOSIS — K219 Gastro-esophageal reflux disease without esophagitis: Secondary | ICD-10-CM | POA: Diagnosis not present

## 2019-04-09 DIAGNOSIS — R0600 Dyspnea, unspecified: Secondary | ICD-10-CM | POA: Diagnosis not present

## 2019-04-09 DIAGNOSIS — R059 Cough, unspecified: Secondary | ICD-10-CM

## 2019-04-09 MED ORDER — PROPRANOLOL HCL ER 60 MG PO CP24: 60.0000 mg | ORAL_CAPSULE | Freq: Every day | ORAL | 1 refills | Status: DC

## 2019-04-09 MED ORDER — ALBENDAZOLE 200 MG PO TABS: 400.0000 mg | ORAL_TABLET | ORAL | 0 refills | Status: DC

## 2019-04-09 MED ORDER — PANTOPRAZOLE SODIUM 40 MG PO TBEC: 40.0000 mg | DELAYED_RELEASE_TABLET | Freq: Two times a day (BID) | ORAL | 1 refills | Status: DC

## 2019-04-09 MED ORDER — COZAAR 25 MG PO TABS: 25.0000 mg | ORAL_TABLET | Freq: Every day | ORAL | 1 refills | Status: DC

## 2019-04-09 MED ORDER — LEVOTHYROXINE SODIUM 50 MCG PO TABS: 50.0000 ug | ORAL_TABLET | Freq: Every day | ORAL | 1 refills | Status: DC

## 2019-04-12 NOTE — Assessment & Plan Note (Signed)
hgba1c acceptable, minimize simple carbs. Increase exercise as tolerated. Continue current meds 

## 2019-04-12 NOTE — Assessment & Plan Note (Signed)
Patient correlates the worsening of her symptoms with her Losartan being changed from brand name Cozaar to generic Losartan . She is undergoing PT for vertigo which does help some but she continues to have daily symptoms. We will attempt the brand name medicine again and consider further work up if no improvement. She will call or seek care if worsens.

## 2019-04-12 NOTE — Assessment & Plan Note (Addendum)
Avoid offending foods, start probiotics. Do not eat large meals in late evening and consider raising head of bed. Famotidine 20 mg bid

## 2019-04-12 NOTE — Assessment & Plan Note (Signed)
With cough and congestion, is referred to pulmonology for further consideration.

## 2019-04-12 NOTE — Assessment & Plan Note (Signed)
Do not supplement and monitor

## 2019-04-12 NOTE — Assessment & Plan Note (Signed)
Well controlled, no changes to meds. Encouraged heart healthy diet such as the DASH diet and exercise as tolerated.  °

## 2019-04-12 NOTE — Progress Notes (Signed)
Virtual Visit via Telephone Note  I connected with Christina Mckinney on 04/09/19 at  1:40 PM EDT by a phone enabled telemedicine application and verified that I am speaking with the correct person using two identifiers.  Location: Patient: home Provider: office   I discussed the limitations of evaluation and management by telemedicine and the availability of in person appointments. The patient expressed understanding and agreed to proceed.    Subjective:    Patient ID: Christina Mckinney, female    DOB: [birth year], 85 y.o.   MRN: [id number]  No chief complaint on file.   HPI Patient is in today for evaluation of persistent vertigo, hypertension, diabetes and more. She is undergoing physical therapy and yet her symptoms persist. She gets a spinning and/or light headed numerous times each day. No recent febrile illnesss or hospitalizations. Notes a cough, nasal congestion. Headache. Denies CP/palp/SOB/HA/fevers/GI or GU c/o. Taking meds as prescribed  Past Medical History:  Diagnosis Date  . Anemia, unspecified   . Anxiety and depression 09/18/2015  . Anxiety state, unspecified   . Chest pain   . Chronic systolic CHF (congestive heart failure) ([place]) 08/12/2015  . Depression with anxiety 06/02/2007   Qualifier: Diagnosis of  By: Wynona Luna   . Depressive disorder, not elsewhere classified   . Diverticulitis   . DVT (deep venous thrombosis) ([place])   . Dyspnea 07/22/2015  . Edema 06/12/2014  . Essential and other specified forms of tremor   . GERD (gastroesophageal reflux disease)   . Gout, unspecified   . Internal hemorrhoids without mention of complication   . Macular degeneration 08/07/2015  . Osteoarthritis   . Other abnormal glucose   . Other and unspecified hyperlipidemia   . Personal history of colonic polyps   . Personal history of peptic ulcer disease   . Pulmonary embolism ([place])   . Unspecified disorder of bladder   . Unspecified essential hypertension   . Unspecified  hypothyroidism   . Varicose veins     Past Surgical History:  Procedure Laterality Date  . ABDOMINAL HYSTERECTOMY    . CHOLECYSTECTOMY    . ENDOVENOUS ABLATION SAPHENOUS VEIN W/ LASER  09-11-2012   left greater saphenous vein   by Curt Jews MD  . EYE SURGERY    . KNEE ARTHROSCOPY     right   . NASAL SEPTUM SURGERY    . SHOULDER SURGERY    . TOTAL KNEE ARTHROPLASTY  09/2013   Right    Family History  Problem Relation Age of Onset  . Other Mother        varicose veins  . Hip fracture Mother   . Heart disease Father        CHF  . Hyperlipidemia Father   . Heart attack Father   . Diabetes Daughter   . Heart disease Daughter   . Hyperlipidemia Daughter   . Hypertension Daughter   . Aneurysm Daughter   . Cancer Sister        stomach  . Heart disease Sister   . Heart disease Brother   . Colon cancer Neg Hx     Social History   Socioeconomic History  . Marital status: Widowed    Spouse name: Not on file  . Number of children: 3  . Years of education: Not on file  . Highest education level: Not on file  Occupational History  . Occupation: Retired   Scientific laboratory technician  . Financial resource strain: Not on file  .  Food insecurity:    Worry: Not on file    Inability: Not on file  . Transportation needs:    Medical: Not on file    Non-medical: Not on file  Tobacco Use  . Smoking status: Former Smoker    Types: Cigarettes    Last attempt to quit: 11/12/1988    Years since quitting: 30.4  . Smokeless tobacco: Never Used  Substance and Sexual Activity  . Alcohol use: Yes    Alcohol/week: 4.0 standard drinks    Types: 4 Standard drinks or equivalent per week    Comment: occasional wine   . Drug use: No  . Sexual activity: Never    Comment: lives alone, no dietary restrictions, widowed  Lifestyle  . Physical activity:    Days per week: Not on file    Minutes per session: Not on file  . Stress: Not on file  Relationships  . Social connections:    Talks on phone:  Not on file    Gets together: Not on file    Attends religious service: Not on file    Active member of club or organization: Not on file    Attends meetings of clubs or organizations: Not on file    Relationship status: Not on file  . Intimate partner violence:    Fear of current or ex partner: Not on file    Emotionally abused: Not on file    Physically abused: Not on file    Forced sexual activity: Not on file  Other Topics Concern  . Not on file  Social History Narrative  . Not on file    Outpatient Medications Prior to Visit  Medication Sig Dispense Refill  . ALPRAZolam (XANAX) 0.5 MG tablet Take 1 tablet (0.5 mg total) by mouth at bedtime as needed for anxiety. 90 tablet 1  . aspirin 81 MG tablet Take 1 tablet (81 mg total) by mouth daily. 30 tablet   . co-enzyme Q-10 30 MG capsule Take 30 mg by mouth daily.     . famotidine (PEPCID) 40 MG tablet Take 1 tablet (40 mg total) by mouth daily. 90 tablet 1  . fenofibrate micronized (LOFIBRA) 134 MG capsule TAKE 1 CAPSULE DAILY 90 capsule 50  . fexofenadine (ALLEGRA ALLERGY) 180 MG tablet Take 1 tablet (180 mg total) by mouth daily. 90 tablet 1  . fluticasone (FLONASE) 50 MCG/ACT nasal spray USE 2 SPRAYS IN EACH NOSTRIL DAILY AS NEEDED FOR ALLERGIES OR RHINITIS (Patient taking differently: Place 2 sprays into both nostrils daily. USE 2 SPRAYS IN EACH NOSTRIL DAILY AS NEEDED FOR ALLERGIES OR RHINITIS) 48 g 1  . fluticasone (FLOVENT HFA) 110 MCG/ACT inhaler Inhale 2 puffs into the lungs 2 (two) times daily. 3 Inhaler 3  . furosemide (LASIX) 40 MG tablet Take 1 tablet (40 mg total) by mouth daily. as directed 90 tablet 4  . gabapentin (NEURONTIN) 300 MG capsule Take 1 capsule (300 mg total) by mouth at bedtime. 90 capsule 1  . guaiFENesin (MUCINEX) 600 MG 12 hr tablet Take 1,200 mg by mouth 2 (two) times daily as needed for to loosen phlegm.     Marland Kitchen ipratropium-albuterol (DUONEB) 0.5-2.5 (3) MG/3ML SOLN Take 3 times a day for next 3 days and  then as needed for wheezing. 360 mL 0  . meclizine (ANTIVERT) 25 MG tablet Take 1 tablet (25 mg total) by mouth 3 (three) times daily as needed for dizziness. 30 tablet 0  . montelukast (SINGULAIR) 10 MG tablet  TAKE 1 TABLET AT BEDTIME 90 tablet 4  . PATADAY 0.2 % SOLN Place 1 drop into both eyes daily.    Marland Kitchen PROAIR HFA 108 (90 Base) MCG/ACT inhaler USE 2 INHALATIONS EVERY 6 HOURS AS NEEDED FOR WHEEZING OR SHORTNESS OF BREATH (Patient taking differently: Inhale 2 puffs into the lungs every 6 (six) hours as needed for wheezing or shortness of breath. ) 25.5 g 1  . sucralfate (CARAFATE) 1 g tablet Take 1 tablet (1 g total) by mouth 4 (four) times daily. 360 tablet 0  . ULORIC 40 MG tablet Take 1 tablet (40 mg total) by mouth daily. Brand Name Uloric 90 tablet 1  . levothyroxine (SYNTHROID, LEVOTHROID) 50 MCG tablet TAKE 1 TABLET DAILY BEFORE BREAKFAST 90 tablet 4  . losartan (COZAAR) 50 MG tablet Take 1 tablet (50 mg total) by mouth daily. 90 tablet 1  . pantoprazole (PROTONIX) 40 MG tablet Take 1 tablet (40 mg total) by mouth 2 (two) times daily. 180 tablet 5  . propranolol ER (INDERAL LA) 60 MG 24 hr capsule Take 1 capsule (60 mg total) by mouth daily. 90 capsule 1   No facility-administered medications prior to visit.     Allergies  Allergen Reactions  . Simvastatin Other (See Comments)    Hip pain  . Crestor [Rosuvastatin]     Left hip pain  . Lipitor [Atorvastatin]     Pain in hips  . Red Dye     Hip pain  . Statins Other (See Comments)    Muscle aches, could not walk    Review of Systems  Constitutional: Positive for malaise/fatigue. Negative for fever.  HENT: Positive for congestion.   Eyes: Negative for blurred vision.  Respiratory: Positive for cough and sputum production. Negative for shortness of breath.   Cardiovascular: Negative for chest pain, palpitations and leg swelling.  Gastrointestinal: Negative for abdominal pain, blood in stool and nausea.  Genitourinary:  Negative for dysuria and frequency.  Musculoskeletal: Negative for falls.  Skin: Negative for rash.  Neurological: Negative for dizziness, loss of consciousness and headaches.  Endo/Heme/Allergies: Negative for environmental allergies.  Psychiatric/Behavioral: Negative for depression. The patient is not nervous/anxious.        Objective:    Physical Exam  BP 108/62   Pulse 79  Wt Readings from Last 3 Encounters:  03/03/19 158 lb (71.7 kg)  02/05/19 145 lb (65.8 kg)  01/23/19 158 lb (71.7 kg)    Diabetic Foot Exam - Simple   No data filed     Lab Results  Component Value Date   WBC 5.9 01/09/2019   HGB 11.5 (L) 01/09/2019   HCT 34.1 (L) 01/09/2019   PLT 226 01/09/2019   GLUCOSE 94 01/23/2019   CHOL 221 (H) 09/04/2018   TRIG 143.0 09/04/2018   HDL 57.80 09/04/2018   LDLDIRECT 96.0 04/22/2017   LDLCALC 135 (H) 09/04/2018   ALT 9 01/09/2019   AST 35 01/09/2019   NA 141 01/23/2019   K 4.4 01/23/2019   CL 103 01/23/2019   CREATININE 1.36 (H) 01/23/2019   BUN 27 (H) 01/23/2019   CO2 28 01/23/2019   TSH 3.327 12/19/2018   INR 1.0 07/21/2010   HGBA1C 5.5 03/17/2018   MICROALBUR 0.9 01/31/2016    Lab Results  Component Value Date   TSH 3.327 12/19/2018   Lab Results  Component Value Date   WBC 5.9 01/09/2019   HGB 11.5 (L) 01/09/2019   HCT 34.1 (L) 01/09/2019   MCV 94.5  01/09/2019   PLT 226 01/09/2019   Lab Results  Component Value Date   NA 141 01/23/2019   K 4.4 01/23/2019   CO2 28 01/23/2019   GLUCOSE 94 01/23/2019   BUN 27 (H) 01/23/2019   CREATININE 1.36 (H) 01/23/2019   BILITOT 0.5 01/09/2019   ALKPHOS 68 12/30/2018   AST 35 01/09/2019   ALT 9 01/09/2019   PROT 6.7 01/09/2019   ALBUMIN 4.4 12/30/2018   CALCIUM 10.8 (H) 01/23/2019   CALCIUM 10.8 (H) 01/23/2019   ANIONGAP 9 12/21/2018   GFR 28.00 (L) 12/30/2018   Lab Results  Component Value Date   CHOL 221 (H) 09/04/2018   Lab Results  Component Value Date   HDL 57.80 09/04/2018    Lab Results  Component Value Date   LDLCALC 135 (H) 09/04/2018   Lab Results  Component Value Date   TRIG 143.0 09/04/2018   Lab Results  Component Value Date   CHOLHDL 4 09/04/2018   Lab Results  Component Value Date   HGBA1C 5.5 03/17/2018       Assessment & Plan:   Problem List Items Addressed This Visit    BENIGN POSITIONAL VERTIGO    Patient correlates the worsening of her symptoms with her Losartan being changed from brand name Cozaar to generic Losartan . She is undergoing PT for vertigo which does help some but she continues to have daily symptoms. We will attempt the brand name medicine again and consider further work up if no improvement. She will call or seek care if worsens.       Essential hypertension    Well controlled, no changes to meds. Encouraged heart healthy diet such as the DASH diet and exercise as tolerated.       Relevant Medications   COZAAR 25 MG tablet   propranolol ER (INDERAL LA) 60 MG 24 hr capsule   GERD    Avoid offending foods, start probiotics. Do not eat large meals in late evening and consider raising head of bed. Famotidine 20 mg bid      Relevant Medications   pantoprazole (PROTONIX) 40 MG tablet   Diabetes mellitus without complication (HCC)    [id number] acceptable, minimize simple carbs. Increase exercise as tolerated. Continue current meds      Relevant Medications   COZAAR 25 MG tablet   Dyspnea    With cough and congestion, is referred to pulmonology for further consideration.       High vitamin D level    Do not supplement and monitor       Other Visit Diagnoses    SOB (shortness of breath)    -  Primary   Relevant Orders   Ambulatory referral to Pulmonology   Cough       Relevant Orders   Ambulatory referral to Pulmonology   Pleural effusion       Relevant Orders   Ambulatory referral to Pulmonology      I have discontinued Cadience S. Osmundson's losartan. I have also changed her levothyroxine. Additionally, I am  having her start on Cozaar and albendazole. Lastly, I am having her maintain her co-enzyme Q-10, aspirin, Pataday, ProAir HFA, sucralfate, fenofibrate micronized, fluticasone, montelukast, Uloric, ALPRAZolam, fluticasone, fexofenadine, famotidine, guaiFENesin, furosemide, ipratropium-albuterol, meclizine, gabapentin, pantoprazole, and propranolol ER.  Meds ordered this encounter  Medications  . COZAAR 25 MG tablet    Sig: Take 1 tablet (25 mg total) by mouth daily.    Dispense:  90 tablet    Refill:  1  . levothyroxine (SYNTHROID) 50 MCG tablet    Sig: Take 1 tablet (50 mcg total) by mouth daily before breakfast.    Dispense:  90 tablet    Refill:  1  . pantoprazole (PROTONIX) 40 MG tablet    Sig: Take 1 tablet (40 mg total) by mouth 2 (two) times daily.    Dispense:  180 tablet    Refill:  1  . DISCONTD: propranolol ER (INDERAL LA) 60 MG 24 hr capsule    Sig: Take 1 capsule (60 mg total) by mouth daily.    Dispense:  90 capsule    Refill:  1  . propranolol ER (INDERAL LA) 60 MG 24 hr capsule    Sig: Take 1 capsule (60 mg total) by mouth daily.    Dispense:  30 capsule    Refill:  1  . albendazole (ALBENZA) 200 MG tablet    Sig: Take 2 tablets (400 mg total) by mouth once a week.    Dispense:  4 tablet    Refill:  0     [name], MD    I discussed the assessment and treatment plan with the patient. The patient was provided an opportunity to ask questions and all were answered. The patient agreed with the plan and demonstrated an understanding of the instructions.   The patient was advised to call back or seek an in-person evaluation if the symptoms worsen or if the condition fails to improve as anticipated.  I provided 25 minutes of non-face-to-face time during this encounter.   [name], MD

## 2019-04-15 ENCOUNTER — Telehealth: Payer: Self-pay | Admitting: Internal Medicine

## 2019-04-15 DIAGNOSIS — H8113 Benign paroxysmal vertigo, bilateral: Secondary | ICD-10-CM | POA: Diagnosis not present

## 2019-04-15 DIAGNOSIS — R42 Dizziness and giddiness: Secondary | ICD-10-CM | POA: Diagnosis not present

## 2019-04-15 DIAGNOSIS — I951 Orthostatic hypotension: Secondary | ICD-10-CM | POA: Diagnosis not present

## 2019-04-15 NOTE — Telephone Encounter (Signed)
Rec'd referral from Dr. Charlett Blake to do pulmonary consult - pt having SOB, Cough - triage to evaluate if appt in office is appropriate. -pr     By Wonda Olds         Close PreviousNext    Visit Information          MW can you see this pt?

## 2019-04-15 NOTE — Telephone Encounter (Signed)
Yes  But I suspect the schedule is full and she needs 30 min consult slot with all meds in hand

## 2019-04-16 NOTE — Telephone Encounter (Signed)
Called patient to triage sx: She is already established with a pulm doctor @ [facility]. She wanted to get a local pulm doctor. Made aware MW would see patient but his schedule was full for the next couple weeks. Made aware we would be having our other providers cycle back into office in a couple of weeks. Pt says she has heard MW was not a good one to see so she wanted to do her research and call back.

## 2019-04-16 NOTE — Telephone Encounter (Signed)
Noted referral, will wait for patient to call back to schedule -pr

## 2019-04-24 ENCOUNTER — Telehealth: Payer: Self-pay | Admitting: Family Medicine

## 2019-04-24 ENCOUNTER — Ambulatory Visit: Payer: Self-pay | Admitting: Family Medicine

## 2019-04-24 NOTE — Telephone Encounter (Signed)
Pt. Reports she had pneumonia in Feb. Felt better, but 1-2 weeks ago started coughing again. Productive with greenish-brown mucus. No fever today. Feels "bad today. I had to cancel my hair appointment today." Warm transfer to [facility] in the practice for a visit.  Answer Assessment - Initial Assessment Questions 1. ONSET: "When did the cough begin?"      Started 2 weeks ago 2. SEVERITY: "How bad is the cough today?"      Moderate 3. RESPIRATORY DISTRESS: "Describe your breathing."      No distress 4. FEVER: "Do you have a fever?" If so, ask: "What is your temperature, how was it measured, and when did it start?"     None today 5. SPUTUM: "Describe the color of your sputum" (clear, white, yellow, green)     Greenish- brown 6. HEMOPTYSIS: "Are you coughing up any blood?" If so ask: "How much?" (flecks, streaks, tablespoons, etc.)     No 7. CARDIAC HISTORY: "Do you have any history of heart disease?" (e.g., heart attack, congestive heart failure)      No 8. LUNG HISTORY: "Do you have any history of lung disease?"  (e.g., pulmonary embolus, asthma, emphysema)     Pneumonia, asthma 9. PE RISK FACTORS: "Do you have a history of blood clots?" (or: recent major surgery, recent prolonged travel, bedridden)     Yes - both 2010 10. OTHER SYMPTOMS: "Do you have any other symptoms?" (e.g., runny nose, wheezing, chest pain)       Wheezing 11. PREGNANCY: "Is there any chance you are pregnant?" "When was your last menstrual period?"       No 12. TRAVEL: "Have you traveled out of the country in the last month?" (e.g., travel history, exposures)       No  Protocols used: [place]

## 2019-04-24 NOTE — Telephone Encounter (Signed)
See other telephone note.  

## 2019-04-25 ENCOUNTER — Other Ambulatory Visit: Payer: Self-pay | Admitting: Family Medicine

## 2019-04-25 MED ORDER — DOXYCYCLINE HYCLATE 100 MG PO TABS: 100.0000 mg | ORAL_TABLET | Freq: Two times a day (BID) | ORAL | 0 refills | Status: DC

## 2019-04-25 NOTE — Telephone Encounter (Signed)
I have sent her in a prescriptin for Doxycycline but if she worsens she will need to be seen by someone

## 2019-04-27 DIAGNOSIS — H8113 Benign paroxysmal vertigo, bilateral: Secondary | ICD-10-CM | POA: Diagnosis not present

## 2019-04-27 DIAGNOSIS — I951 Orthostatic hypotension: Secondary | ICD-10-CM | POA: Diagnosis not present

## 2019-04-27 DIAGNOSIS — R42 Dizziness and giddiness: Secondary | ICD-10-CM | POA: Diagnosis not present

## 2019-04-27 NOTE — Telephone Encounter (Signed)
Called patient left message for patient to call the office back  Nurse triage may handle 

## 2019-04-29 DIAGNOSIS — R42 Dizziness and giddiness: Secondary | ICD-10-CM | POA: Diagnosis not present

## 2019-04-29 DIAGNOSIS — I951 Orthostatic hypotension: Secondary | ICD-10-CM | POA: Diagnosis not present

## 2019-04-29 DIAGNOSIS — H8113 Benign paroxysmal vertigo, bilateral: Secondary | ICD-10-CM | POA: Diagnosis not present

## 2019-05-05 DIAGNOSIS — I951 Orthostatic hypotension: Secondary | ICD-10-CM | POA: Diagnosis not present

## 2019-05-05 DIAGNOSIS — H8113 Benign paroxysmal vertigo, bilateral: Secondary | ICD-10-CM | POA: Diagnosis not present

## 2019-05-05 DIAGNOSIS — R42 Dizziness and giddiness: Secondary | ICD-10-CM | POA: Diagnosis not present

## 2019-05-07 DIAGNOSIS — I951 Orthostatic hypotension: Secondary | ICD-10-CM | POA: Diagnosis not present

## 2019-05-07 DIAGNOSIS — H8113 Benign paroxysmal vertigo, bilateral: Secondary | ICD-10-CM | POA: Diagnosis not present

## 2019-05-07 DIAGNOSIS — R42 Dizziness and giddiness: Secondary | ICD-10-CM | POA: Diagnosis not present

## 2019-05-13 DIAGNOSIS — I951 Orthostatic hypotension: Secondary | ICD-10-CM | POA: Diagnosis not present

## 2019-05-13 DIAGNOSIS — H8113 Benign paroxysmal vertigo, bilateral: Secondary | ICD-10-CM | POA: Diagnosis not present

## 2019-05-13 DIAGNOSIS — R42 Dizziness and giddiness: Secondary | ICD-10-CM | POA: Diagnosis not present

## 2019-05-14 ENCOUNTER — Other Ambulatory Visit: Payer: Self-pay

## 2019-05-14 ENCOUNTER — Ambulatory Visit (INDEPENDENT_AMBULATORY_CARE_PROVIDER_SITE_OTHER): Payer: Medicare Other | Admitting: Family Medicine

## 2019-05-14 DIAGNOSIS — E782 Mixed hyperlipidemia: Secondary | ICD-10-CM | POA: Diagnosis not present

## 2019-05-14 DIAGNOSIS — E039 Hypothyroidism, unspecified: Secondary | ICD-10-CM

## 2019-05-14 DIAGNOSIS — J0101 Acute recurrent maxillary sinusitis: Secondary | ICD-10-CM

## 2019-05-14 DIAGNOSIS — H8110 Benign paroxysmal vertigo, unspecified ear: Secondary | ICD-10-CM | POA: Diagnosis not present

## 2019-05-14 DIAGNOSIS — R350 Frequency of micturition: Secondary | ICD-10-CM | POA: Diagnosis not present

## 2019-05-14 DIAGNOSIS — I10 Essential (primary) hypertension: Secondary | ICD-10-CM

## 2019-05-14 DIAGNOSIS — N189 Chronic kidney disease, unspecified: Secondary | ICD-10-CM | POA: Diagnosis not present

## 2019-05-14 DIAGNOSIS — E673 Hypervitaminosis D: Secondary | ICD-10-CM | POA: Diagnosis not present

## 2019-05-14 DIAGNOSIS — J449 Chronic obstructive pulmonary disease, unspecified: Secondary | ICD-10-CM

## 2019-05-14 MED ORDER — CEFUROXIME AXETIL 250 MG PO TABS: 250.0000 mg | ORAL_TABLET | Freq: Two times a day (BID) | ORAL | 0 refills | Status: DC

## 2019-05-17 NOTE — Progress Notes (Signed)
Virtual Visit via phone Note  I connected with Christina Mckinney on 05/17/19 at  2:00 PM EDT by a phone enabled telemedicine application and verified that I am speaking with the correct person using two identifiers. Marin Roberts CMA attempted to get patient set up on video visit but was unable so visit completed via phone  Location: Patient: home Provider: office   I discussed the limitations of evaluation and management by telemedicine and the availability of in person appointments. The patient expressed understanding and agreed to proceed.    Subjective:    Patient ID: Christina Mckinney, female    DOB: 02/14/1932, 85 y.o.   MRN: [id number]  No chief complaint on file.   HPI Patient is in today for follow up on chronic medical concerns including hypertension, hypothyroidism, recurrent sinusitis and more. No recent febrile illness or hospitalizations. Notes persistent congestion, PND, fatigue and vertigo. Has been undergoing physical therapy for her vertigo but her symptoms persist. Denies CP/palp/SOB/HA/fevers/GI or GU c/o. Taking meds as prescribed  Past Medical History:  Diagnosis Date  . Anemia, unspecified   . Anxiety and depression 09/18/2015  . Anxiety state, unspecified   . Chest pain   . Chronic systolic CHF (congestive heart failure) ([place]) 08/12/2015  . Depression with anxiety 06/02/2007   Qualifier: Diagnosis of  By: Wynona Luna   . Depressive disorder, not elsewhere classified   . Diverticulitis   . DVT (deep venous thrombosis) ([place])   . Dyspnea 07/22/2015  . Edema 06/12/2014  . Essential and other specified forms of tremor   . GERD (gastroesophageal reflux disease)   . Gout, unspecified   . Internal hemorrhoids without mention of complication   . Macular degeneration 08/07/2015  . Osteoarthritis   . Other abnormal glucose   . Other and unspecified hyperlipidemia   . Personal history of colonic polyps   . Personal history of peptic ulcer disease   . Pulmonary embolism  ([place])   . Unspecified disorder of bladder   . Unspecified essential hypertension   . Unspecified hypothyroidism   . Varicose veins     Past Surgical History:  Procedure Laterality Date  . ABDOMINAL HYSTERECTOMY    . CHOLECYSTECTOMY    . ENDOVENOUS ABLATION SAPHENOUS VEIN W/ LASER  09-11-2012   left greater saphenous vein   by Curt Jews MD  . EYE SURGERY    . KNEE ARTHROSCOPY     right   . NASAL SEPTUM SURGERY    . SHOULDER SURGERY    . TOTAL KNEE ARTHROPLASTY  09/2013   Right    Family History  Problem Relation Age of Onset  . Other Mother        varicose veins  . Hip fracture Mother   . Heart disease Father        CHF  . Hyperlipidemia Father   . Heart attack Father   . Diabetes Daughter   . Heart disease Daughter   . Hyperlipidemia Daughter   . Hypertension Daughter   . Aneurysm Daughter   . Cancer Sister        stomach  . Heart disease Sister   . Heart disease Brother   . Colon cancer Neg Hx     Social History   Socioeconomic History  . Marital status: Widowed    Spouse name: Not on file  . Number of children: 3  . Years of education: Not on file  . Highest education level: Not on file  Occupational  History  . Occupation: Retired   Scientific laboratory technician  . Financial resource strain: Not on file  . Food insecurity    Worry: Not on file    Inability: Not on file  . Transportation needs    Medical: Not on file    Non-medical: Not on file  Tobacco Use  . Smoking status: Former Smoker    Types: Cigarettes    Quit date: 11/12/1988    Years since quitting: 30.5  . Smokeless tobacco: Never Used  Substance and Sexual Activity  . Alcohol use: Yes    Alcohol/week: 4.0 standard drinks    Types: 4 Standard drinks or equivalent per week    Comment: occasional wine   . Drug use: No  . Sexual activity: Never    Comment: lives alone, no dietary restrictions, widowed  Lifestyle  . Physical activity    Days per week: Not on file    Minutes per session: Not on file   . Stress: Not on file  Relationships  . Social Herbalist on phone: Not on file    Gets together: Not on file    Attends religious service: Not on file    Active member of club or organization: Not on file    Attends meetings of clubs or organizations: Not on file    Relationship status: Not on file  . Intimate partner violence    Fear of current or ex partner: Not on file    Emotionally abused: Not on file    Physically abused: Not on file    Forced sexual activity: Not on file  Other Topics Concern  . Not on file  Social History Narrative  . Not on file    Outpatient Medications Prior to Visit  Medication Sig Dispense Refill  . albendazole (ALBENZA) 200 MG tablet Take 2 tablets (400 mg total) by mouth once a week. 4 tablet 0  . ALPRAZolam (XANAX) 0.5 MG tablet Take 1 tablet (0.5 mg total) by mouth at bedtime as needed for anxiety. 90 tablet 1  . aspirin 81 MG tablet Take 1 tablet (81 mg total) by mouth daily. 30 tablet   . co-enzyme Q-10 30 MG capsule Take 30 mg by mouth daily.     Marland Kitchen COZAAR 25 MG tablet Take 1 tablet (25 mg total) by mouth daily. 90 tablet 1  . doxycycline (VIBRA-TABS) 100 MG tablet Take 1 tablet (100 mg total) by mouth 2 (two) times daily. 20 tablet 0  . famotidine (PEPCID) 40 MG tablet Take 1 tablet (40 mg total) by mouth daily. 90 tablet 1  . fenofibrate micronized (LOFIBRA) 134 MG capsule TAKE 1 CAPSULE DAILY 90 capsule 50  . fexofenadine (ALLEGRA ALLERGY) 180 MG tablet Take 1 tablet (180 mg total) by mouth daily. 90 tablet 1  . fluticasone (FLONASE) 50 MCG/ACT nasal spray USE 2 SPRAYS IN EACH NOSTRIL DAILY AS NEEDED FOR ALLERGIES OR RHINITIS (Patient taking differently: Place 2 sprays into both nostrils daily. USE 2 SPRAYS IN EACH NOSTRIL DAILY AS NEEDED FOR ALLERGIES OR RHINITIS) 48 g 1  . fluticasone (FLOVENT HFA) 110 MCG/ACT inhaler Inhale 2 puffs into the lungs 2 (two) times daily. 3 Inhaler 3  . furosemide (LASIX) 40 MG tablet Take 1 tablet (40  mg total) by mouth daily. as directed 90 tablet 4  . gabapentin (NEURONTIN) 300 MG capsule Take 1 capsule (300 mg total) by mouth at bedtime. 90 capsule 1  . guaiFENesin (MUCINEX) 600 MG 12 hr  tablet Take 1,200 mg by mouth 2 (two) times daily as needed for to loosen phlegm.     Marland Kitchen ipratropium-albuterol (DUONEB) 0.5-2.5 (3) MG/3ML SOLN Take 3 times a day for next 3 days and then as needed for wheezing. 360 mL 0  . levothyroxine (SYNTHROID) 50 MCG tablet Take 1 tablet (50 mcg total) by mouth daily before breakfast. 90 tablet 1  . meclizine (ANTIVERT) 25 MG tablet Take 1 tablet (25 mg total) by mouth 3 (three) times daily as needed for dizziness. 30 tablet 0  . montelukast (SINGULAIR) 10 MG tablet TAKE 1 TABLET AT BEDTIME 90 tablet 4  . pantoprazole (PROTONIX) 40 MG tablet Take 1 tablet (40 mg total) by mouth 2 (two) times daily. 180 tablet 1  . PATADAY 0.2 % SOLN Place 1 drop into both eyes daily.    Marland Kitchen PROAIR HFA 108 (90 Base) MCG/ACT inhaler USE 2 INHALATIONS EVERY 6 HOURS AS NEEDED FOR WHEEZING OR SHORTNESS OF BREATH (Patient taking differently: Inhale 2 puffs into the lungs every 6 (six) hours as needed for wheezing or shortness of breath. ) 25.5 g 1  . propranolol ER (INDERAL LA) 60 MG 24 hr capsule Take 1 capsule (60 mg total) by mouth daily. 30 capsule 1  . sucralfate (CARAFATE) 1 g tablet Take 1 tablet (1 g total) by mouth 4 (four) times daily. 360 tablet 0  . ULORIC 40 MG tablet Take 1 tablet (40 mg total) by mouth daily. Brand Name Uloric 90 tablet 1   No facility-administered medications prior to visit.     Allergies  Allergen Reactions  . Simvastatin Other (See Comments)    Hip pain  . Crestor [Rosuvastatin]     Left hip pain  . Lipitor [Atorvastatin]     Pain in hips  . Red Dye     Hip pain  . Statins Other (See Comments)    Muscle aches, could not walk    Review of Systems  Constitutional: Positive for malaise/fatigue. Negative for fever.  HENT: Positive for congestion and  sinus pain.   Eyes: Negative for blurred vision.  Respiratory: Positive for sputum production. Negative for shortness of breath.   Cardiovascular: Negative for chest pain, palpitations and leg swelling.  Gastrointestinal: Negative for abdominal pain, blood in stool and nausea.  Genitourinary: Negative for dysuria and frequency.  Musculoskeletal: Negative for falls.  Skin: Negative for rash.  Neurological: Positive for dizziness and tremors. Negative for loss of consciousness and headaches.  Endo/Heme/Allergies: Negative for environmental allergies.  Psychiatric/Behavioral: Negative for depression. The patient is not nervous/anxious.        Objective:    Physical Exam unable to obtain via phone  There were no vitals taken for this visit. Wt Readings from Last 3 Encounters:  03/03/19 158 lb (71.7 kg)  02/05/19 145 lb (65.8 kg)  01/23/19 158 lb (71.7 kg)    Diabetic Foot Exam - Simple   No data filed     Lab Results  Component Value Date   WBC 5.9 01/09/2019   HGB 11.5 (L) 01/09/2019   HCT 34.1 (L) 01/09/2019   PLT 226 01/09/2019   GLUCOSE 94 01/23/2019   CHOL 221 (H) 09/04/2018   TRIG 143.0 09/04/2018   HDL 57.80 09/04/2018   LDLDIRECT 96.0 04/22/2017   LDLCALC 135 (H) 09/04/2018   ALT 9 01/09/2019   AST 35 01/09/2019   NA 141 01/23/2019   K 4.4 01/23/2019   CL 103 01/23/2019   CREATININE 1.36 (H) 01/23/2019  BUN 27 (H) 01/23/2019   CO2 28 01/23/2019   TSH 3.327 12/19/2018   INR 1.0 07/21/2010   HGBA1C 5.5 03/17/2018   MICROALBUR 0.9 01/31/2016    Lab Results  Component Value Date   TSH 3.327 12/19/2018   Lab Results  Component Value Date   WBC 5.9 01/09/2019   HGB 11.5 (L) 01/09/2019   HCT 34.1 (L) 01/09/2019   MCV 94.5 01/09/2019   PLT 226 01/09/2019   Lab Results  Component Value Date   NA 141 01/23/2019   K 4.4 01/23/2019   CO2 28 01/23/2019   GLUCOSE 94 01/23/2019   BUN 27 (H) 01/23/2019   CREATININE 1.36 (H) 01/23/2019   BILITOT 0.5  01/09/2019   ALKPHOS 68 12/30/2018   AST 35 01/09/2019   ALT 9 01/09/2019   PROT 6.7 01/09/2019   ALBUMIN 4.4 12/30/2018   CALCIUM 10.8 (H) 01/23/2019   CALCIUM 10.8 (H) 01/23/2019   ANIONGAP 9 12/21/2018   GFR 28.00 (L) 12/30/2018   Lab Results  Component Value Date   CHOL 221 (H) 09/04/2018   Lab Results  Component Value Date   HDL 57.80 09/04/2018   Lab Results  Component Value Date   LDLCALC 135 (H) 09/04/2018   Lab Results  Component Value Date   TRIG 143.0 09/04/2018   Lab Results  Component Value Date   CHOLHDL 4 09/04/2018   Lab Results  Component Value Date   HGBA1C 5.5 03/17/2018       Assessment & Plan:   Problem List Items Addressed This Visit    Hypothyroidism - Primary   Relevant Orders   TSH   Hyperlipidemia, mixed   Relevant Orders   Lipid Profile   BENIGN POSITIONAL VERTIGO    Is undergoing therapy and still struggling with vertigo. Will proceed with labwork and follow up with review      Essential hypertension   Relevant Orders   Comp Met (CMET)   CBC w/Diff   COPD (chronic obstructive pulmonary disease) (HCC)    No recent exacerbation      Acute recurrent maxillary sinusitis    Encouraged increased rest and hydration, add probiotics, zinc such as Coldeze or Xicam. Treat fevers as needed. Has done well with Ceftin in the past will start a course.       Relevant Medications   cefUROXime (CEFTIN) 250 MG tablet   High vitamin D level    monitor      CRI (chronic renal insufficiency)    Hydrate and monitor       Other Visit Diagnoses    Urinary frequency       Relevant Orders   POCT Urinalysis Dipstick (Automated)   Urine Culture      I am having Christina Mckinney start on cefUROXime. I am also having her maintain her co-enzyme Q-10, aspirin, Pataday, ProAir HFA, sucralfate, fenofibrate micronized, fluticasone, montelukast, Uloric, ALPRAZolam, fluticasone, fexofenadine, famotidine, guaiFENesin, furosemide,  ipratropium-albuterol, meclizine, gabapentin, Cozaar, levothyroxine, pantoprazole, propranolol ER, albendazole, and doxycycline.  Meds ordered this encounter  Medications  . cefUROXime (CEFTIN) 250 MG tablet    Sig: Take 1 tablet (250 mg total) by mouth 2 (two) times daily with a meal.    Dispense:  20 tablet    Refill:  0    I discussed the assessment and treatment plan with the patient. The patient was provided an opportunity to ask questions and all were answered. The patient agreed with the plan and demonstrated an understanding of  the instructions.   The patient was advised to call back or seek an in-person evaluation if the symptoms worsen or if the condition fails to improve as anticipated.  I provided 25 minutes of non-face-to-face time during this encounter.   [name], MD

## 2019-05-17 NOTE — Assessment & Plan Note (Signed)
No recent exacerbation 

## 2019-05-17 NOTE — Assessment & Plan Note (Signed)
Encouraged increased rest and hydration, add probiotics, zinc such as Coldeze or Xicam. Treat fevers as needed. Has done well with Ceftin in the past will start a course.

## 2019-05-17 NOTE — Assessment & Plan Note (Signed)
Hydrate and monitor 

## 2019-05-17 NOTE — Assessment & Plan Note (Signed)
Is undergoing therapy and still struggling with vertigo. Will proceed with labwork and follow up with review

## 2019-05-17 NOTE — Assessment & Plan Note (Signed)
monitor

## 2019-05-20 DIAGNOSIS — I951 Orthostatic hypotension: Secondary | ICD-10-CM | POA: Diagnosis not present

## 2019-05-20 DIAGNOSIS — R42 Dizziness and giddiness: Secondary | ICD-10-CM | POA: Diagnosis not present

## 2019-05-20 DIAGNOSIS — H8113 Benign paroxysmal vertigo, bilateral: Secondary | ICD-10-CM | POA: Diagnosis not present

## 2019-05-21 ENCOUNTER — Other Ambulatory Visit: Payer: Self-pay

## 2019-05-21 ENCOUNTER — Other Ambulatory Visit (INDEPENDENT_AMBULATORY_CARE_PROVIDER_SITE_OTHER): Payer: Medicare Other

## 2019-05-21 DIAGNOSIS — E119 Type 2 diabetes mellitus without complications: Secondary | ICD-10-CM | POA: Diagnosis not present

## 2019-05-21 DIAGNOSIS — E559 Vitamin D deficiency, unspecified: Secondary | ICD-10-CM

## 2019-05-21 DIAGNOSIS — E039 Hypothyroidism, unspecified: Secondary | ICD-10-CM | POA: Diagnosis not present

## 2019-05-21 DIAGNOSIS — R350 Frequency of micturition: Secondary | ICD-10-CM

## 2019-05-21 DIAGNOSIS — I10 Essential (primary) hypertension: Secondary | ICD-10-CM

## 2019-05-21 DIAGNOSIS — E782 Mixed hyperlipidemia: Secondary | ICD-10-CM

## 2019-05-21 LAB — CBC WITH DIFFERENTIAL/PLATELET
Basophils Absolute: 0 10*3/uL (ref 0.0–0.1)
Basophils Relative: 0.4 % (ref 0.0–3.0)
Eosinophils Absolute: 0.4 10*3/uL (ref 0.0–0.7)
Eosinophils Relative: 5.7 % — ABNORMAL HIGH (ref 0.0–5.0)
HCT: 36 % (ref 36.0–46.0)
Hemoglobin: 11.8 g/dL — ABNORMAL LOW (ref 12.0–15.0)
Lymphocytes Relative: 30.6 % (ref 12.0–46.0)
Lymphs Abs: 2 10*3/uL (ref 0.7–4.0)
MCHC: 32.8 g/dL (ref 30.0–36.0)
MCV: 95.2 fl (ref 78.0–100.0)
Monocytes Absolute: 0.6 10*3/uL (ref 0.1–1.0)
Monocytes Relative: 8.6 % (ref 3.0–12.0)
Neutro Abs: 3.5 10*3/uL (ref 1.4–7.7)
Neutrophils Relative %: 54.7 % (ref 43.0–77.0)
Platelets: 176 10*3/uL (ref 150.0–400.0)
RBC: 3.78 Mil/uL — ABNORMAL LOW (ref 3.87–5.11)
RDW: 14.7 % (ref 11.5–15.5)
WBC: 6.4 10*3/uL (ref 4.0–10.5)

## 2019-05-21 LAB — COMPREHENSIVE METABOLIC PANEL
ALT: 24 U/L (ref 0–35)
AST: 46 U/L — ABNORMAL HIGH (ref 0–37)
Albumin: 3.9 g/dL (ref 3.5–5.2)
Alkaline Phosphatase: 100 U/L (ref 39–117)
BUN: 14 mg/dL (ref 6–23)
CO2: 32 mEq/L (ref 19–32)
Calcium: 10.5 mg/dL (ref 8.4–10.5)
Chloride: 101 mEq/L (ref 96–112)
Creatinine, Ser: 1.1 mg/dL (ref 0.40–1.20)
GFR: 47.17 mL/min — ABNORMAL LOW (ref >60.00)
Glucose, Bld: 84 mg/dL (ref 70–99)
Potassium: 5.8 mEq/L — ABNORMAL HIGH (ref 3.5–5.1)
Sodium: 140 mEq/L (ref 135–145)
Total Bilirubin: 0.5 mg/dL (ref 0.2–1.2)
Total Protein: 6.7 g/dL (ref 6.0–8.3)

## 2019-05-21 LAB — LIPID PANEL
Cholesterol: 160 mg/dL (ref 0–200)
HDL: 49.3 mg/dL (ref >39.00)
NonHDL: 110.44
Total CHOL/HDL Ratio: 3
Triglycerides: 202 mg/dL — ABNORMAL HIGH (ref 0.0–149.0)
VLDL: 40.4 mg/dL — ABNORMAL HIGH (ref 0.0–40.0)

## 2019-05-21 LAB — TSH: TSH: 1.56 u[IU]/mL (ref 0.35–4.50)

## 2019-05-21 LAB — LDL CHOLESTEROL, DIRECT: Direct LDL: 83 mg/dL

## 2019-05-21 LAB — HEMOGLOBIN A1C: Hgb A1c MFr Bld: 6 % (ref 4.6–6.5)

## 2019-05-21 LAB — VITAMIN D 25 HYDROXY (VIT D DEFICIENCY, FRACTURES): VITD: 64.76 ng/mL (ref 30.00–100.00)

## 2019-05-22 LAB — URINE CULTURE
MICRO NUMBER:: 650360
SPECIMEN QUALITY:: ADEQUATE

## 2019-05-26 DIAGNOSIS — R42 Dizziness and giddiness: Secondary | ICD-10-CM | POA: Diagnosis not present

## 2019-05-26 DIAGNOSIS — I951 Orthostatic hypotension: Secondary | ICD-10-CM | POA: Diagnosis not present

## 2019-05-26 DIAGNOSIS — H8113 Benign paroxysmal vertigo, bilateral: Secondary | ICD-10-CM | POA: Diagnosis not present

## 2019-06-03 DIAGNOSIS — I951 Orthostatic hypotension: Secondary | ICD-10-CM | POA: Diagnosis not present

## 2019-06-03 DIAGNOSIS — R42 Dizziness and giddiness: Secondary | ICD-10-CM | POA: Diagnosis not present

## 2019-06-03 DIAGNOSIS — H8113 Benign paroxysmal vertigo, bilateral: Secondary | ICD-10-CM | POA: Diagnosis not present

## 2019-06-10 DIAGNOSIS — I951 Orthostatic hypotension: Secondary | ICD-10-CM | POA: Diagnosis not present

## 2019-06-10 DIAGNOSIS — R42 Dizziness and giddiness: Secondary | ICD-10-CM | POA: Diagnosis not present

## 2019-06-10 DIAGNOSIS — H8113 Benign paroxysmal vertigo, bilateral: Secondary | ICD-10-CM | POA: Diagnosis not present

## 2019-06-11 ENCOUNTER — Other Ambulatory Visit: Payer: Self-pay | Admitting: Family Medicine

## 2019-06-17 DIAGNOSIS — R42 Dizziness and giddiness: Secondary | ICD-10-CM | POA: Diagnosis not present

## 2019-06-17 DIAGNOSIS — H8113 Benign paroxysmal vertigo, bilateral: Secondary | ICD-10-CM | POA: Diagnosis not present

## 2019-06-17 DIAGNOSIS — I951 Orthostatic hypotension: Secondary | ICD-10-CM | POA: Diagnosis not present

## 2019-06-24 DIAGNOSIS — R42 Dizziness and giddiness: Secondary | ICD-10-CM | POA: Diagnosis not present

## 2019-06-24 DIAGNOSIS — H8113 Benign paroxysmal vertigo, bilateral: Secondary | ICD-10-CM | POA: Diagnosis not present

## 2019-06-24 DIAGNOSIS — I951 Orthostatic hypotension: Secondary | ICD-10-CM | POA: Diagnosis not present

## 2019-06-26 ENCOUNTER — Telehealth: Payer: Self-pay | Admitting: Family Medicine

## 2019-06-26 NOTE — Telephone Encounter (Signed)
Copied from [place] [id number]. Topic: Quick Communication - Rx Refill/Question >> Jun 26, 2019  4:21 PM Erick Blinks wrote: Reason for CRM: CPAP - she wants the air increased in her CPAP machine. (?) [facility]roxine (SYNTHROID) 50 MCG tablet + COZAAR 25 MG tablet Pt called stating that the generic forms of cozaar and levothyroxine are making her dizzy so bad that she cannot walk, she states that when she had the brand name prescription forms they did not make her dizzy. Please advise Best contact: [phone]

## 2019-06-30 MED ORDER — COZAAR 25 MG PO TABS: 25.0000 mg | ORAL_TABLET | Freq: Every day | ORAL | 1 refills | Status: DC

## 2019-06-30 MED ORDER — ULORIC 40 MG PO TABS: 40.0000 mg | ORAL_TABLET | Freq: Every day | ORAL | 1 refills | Status: DC

## 2019-06-30 MED ORDER — SYNTHROID 50 MCG PO TABS: 50.0000 ug | ORAL_TABLET | Freq: Every day | ORAL | 1 refills | Status: DC

## 2019-06-30 MED ORDER — COZAAR 50 MG PO TABS: 25.0000 mg | ORAL_TABLET | Freq: Every day | ORAL | 1 refills | Status: DC

## 2019-06-30 NOTE — Telephone Encounter (Signed)
Spoke with patient and let her know about medication sent to her pharmacy. Patient stated she wanted me to call in 50mg  of the Cozaar so she can break them in half and it would cost her $60 for 90 days. She currently takes 25mg  of Cozaar.

## 2019-07-01 DIAGNOSIS — I951 Orthostatic hypotension: Secondary | ICD-10-CM | POA: Diagnosis not present

## 2019-07-01 DIAGNOSIS — H8113 Benign paroxysmal vertigo, bilateral: Secondary | ICD-10-CM | POA: Diagnosis not present

## 2019-07-01 DIAGNOSIS — R42 Dizziness and giddiness: Secondary | ICD-10-CM | POA: Diagnosis not present

## 2019-07-06 ENCOUNTER — Other Ambulatory Visit (INDEPENDENT_AMBULATORY_CARE_PROVIDER_SITE_OTHER): Payer: Medicare Other

## 2019-07-06 ENCOUNTER — Other Ambulatory Visit: Payer: Self-pay

## 2019-07-06 DIAGNOSIS — E875 Hyperkalemia: Secondary | ICD-10-CM | POA: Diagnosis not present

## 2019-07-07 LAB — COMPREHENSIVE METABOLIC PANEL
ALT: 7 U/L (ref 0–35)
AST: 20 U/L (ref 0–37)
Albumin: 4.3 g/dL (ref 3.5–5.2)
Alkaline Phosphatase: 90 U/L (ref 39–117)
BUN: 22 mg/dL (ref 6–23)
CO2: 29 mEq/L (ref 19–32)
Calcium: 10.1 mg/dL (ref 8.4–10.5)
Chloride: 104 mEq/L (ref 96–112)
Creatinine, Ser: 1.11 mg/dL (ref 0.40–1.20)
GFR: 46.66 mL/min — ABNORMAL LOW (ref >60.00)
Glucose, Bld: 101 mg/dL — ABNORMAL HIGH (ref 70–99)
Potassium: 5 mEq/L (ref 3.5–5.1)
Sodium: 139 mEq/L (ref 135–145)
Total Bilirubin: 0.5 mg/dL (ref 0.2–1.2)
Total Protein: 6.6 g/dL (ref 6.0–8.3)

## 2019-07-08 ENCOUNTER — Other Ambulatory Visit: Payer: Self-pay | Admitting: Family Medicine

## 2019-07-08 ENCOUNTER — Telehealth: Payer: Self-pay | Admitting: *Deleted

## 2019-07-08 MED ORDER — LASIX 40 MG PO TABS: 40.0000 mg | ORAL_TABLET | Freq: Every day | ORAL | 1 refills | Status: DC

## 2019-07-08 MED ORDER — PROTONIX 40 MG PO TBEC: 40.0000 mg | DELAYED_RELEASE_TABLET | Freq: Two times a day (BID) | ORAL | 3 refills | Status: DC

## 2019-07-08 MED ORDER — SYNTHROID 50 MCG PO TABS: 50.0000 ug | ORAL_TABLET | Freq: Every day | ORAL | 1 refills | Status: DC

## 2019-07-08 MED ORDER — COZAAR 50 MG PO TABS: 25.0000 mg | ORAL_TABLET | Freq: Every day | ORAL | 1 refills | Status: DC

## 2019-07-08 NOTE — Telephone Encounter (Signed)
Patient called in stating she is needing another PA for cozaar, synthroid, protonix, and lasix. Patient stated she is wanting something done ASAP, as she is feeling effects from not having the name brand anymore. Please advise. Call back is [phone]. Please call when has been done.

## 2019-07-08 NOTE — Telephone Encounter (Signed)
All requested Rx's resent as brand name only. Pharmacy will let us know if PA is needed for any of them.

## 2019-07-08 NOTE — Telephone Encounter (Signed)
Left message on machine last week that only brand names Synthroid was covered.  Protonix., Cozaar, and Uloric was denied for Brand name only.  Synthroid case number [id number] Uloric  BY:[id number] Cozaar EJ:[id number] Protonix [id number]

## 2019-07-09 DIAGNOSIS — R42 Dizziness and giddiness: Secondary | ICD-10-CM | POA: Diagnosis not present

## 2019-07-09 DIAGNOSIS — I951 Orthostatic hypotension: Secondary | ICD-10-CM | POA: Diagnosis not present

## 2019-07-09 DIAGNOSIS — H8113 Benign paroxysmal vertigo, bilateral: Secondary | ICD-10-CM | POA: Diagnosis not present

## 2019-07-10 ENCOUNTER — Telehealth: Payer: Self-pay

## 2019-07-10 ENCOUNTER — Other Ambulatory Visit: Payer: Self-pay | Admitting: Family Medicine

## 2019-07-10 MED ORDER — FLOVENT HFA 110 MCG/ACT IN AERO: 2.0000 | INHALATION_SPRAY | Freq: Two times a day (BID) | RESPIRATORY_TRACT | 3 refills | Status: DC

## 2019-07-10 MED ORDER — FLUTICASONE PROPIONATE 50 MCG/ACT NA SUSP: 2.0000 | Freq: Every day | NASAL | 5 refills | Status: DC

## 2019-07-10 MED ORDER — ALPRAZOLAM 0.5 MG PO TABS: 0.5000 mg | ORAL_TABLET | Freq: Every evening | ORAL | 1 refills | Status: DC | PRN

## 2019-07-10 NOTE — Telephone Encounter (Signed)
PA form received from [facility]- completed and faxed to [phone]. Awaiting determination.

## 2019-07-10 NOTE — Telephone Encounter (Signed)
Requested medication (s) are due for refill today: yes  Requested medication (s) are on the active medication list: yes  Last refill:    Future visit scheduled:no  Notes to clinic:This refill cannot be delegated    Requested Prescriptions  Pending Prescriptions Disp Refills   ALPRAZolam (XANAX) 0.5 MG tablet 90 tablet 1    Sig: Take 1 tablet (0.5 mg total) by mouth at bedtime as needed for anxiety.     Not Delegated - Psychiatry:  Anxiolytics/Hypnotics Failed - 07/10/2019 11:07 AM      Failed - This refill cannot be delegated      Passed - Urine Drug Screen completed in last 360 days.      Passed - Valid encounter within last 6 months    Recent Outpatient Visits          1 month ago Hypothyroidism, unspecified type   [facility] at [place], MD   3 months ago SOB (shortness of breath)   Archivist at [place], MD   3 months ago Hypercalcemia   Archivist at [place], MD   4 months ago [place] at [place], MD   5 months ago Diabetes mellitus without complication [facility])   Archivist at [place], MD              fluticasone [facility]) 50 MCG/ACT nasal spray      Sig: Place 2 sprays into both nostrils daily. USE 2 SPRAYS IN EACH NOSTRIL DAILY AS NEEDED FOR ALLERGIES OR RHINITIS     Ear, Nose, and Throat: Nasal Preparations - Corticosteroids Passed - 07/10/2019 11:07 AM      Passed - Valid encounter within last 12 months    Recent Outpatient Visits          1 month ago Hypothyroidism, unspecified type   Archivist at [place], MD   3 months ago SOB (shortness of breath)   Archivist at [place], MD   3 months ago Hypercalcemia   Arts development officer at [place], MD   4 months ago [place] at [place], MD   5 months ago Diabetes mellitus without complication [facility])   Archivist at [place], MD              fluticasone (FLOVENT HFA) 110 MCG/ACT inhaler 3 Inhaler 3    Sig: Inhale 2 puffs into the lungs 2 (two) times daily.     Pulmonology:  Corticosteroids Passed - 07/10/2019 11:07 AM      Passed - Valid encounter within last 12 months    Recent Outpatient Visits          1 month ago Hypothyroidism, unspecified type   Archivist at [place], MD   3 months ago SOB (shortness of breath)   Archivist at [place], MD   3 months ago Hypercalcemia   Archivist at [place], MD   4 months ago [place]  at [place], MD   5 months ago Diabetes mellitus without complication [facility])   Archivist at [place], Bonnita Levan, MD

## 2019-07-10 NOTE — Telephone Encounter (Signed)
Pt request refill  ALPRAZolam (XANAX) 0.5 MG tablet  fluticasone (FLONASE) 50 MCG/ACT nasal spray  fluticasone (FLOVENT HFA) 110 MCG/ACT inhaler   [facility] [place] [place] - [address] [phone] (Phone) [phone] (Fax

## 2019-07-10 NOTE — Telephone Encounter (Signed)
Pt states unless she gets brand name, she gets dizzy from generic meds. Pt called to see if [facility]  Advised pt you had and she verbalized understanding.

## 2019-07-13 NOTE — Telephone Encounter (Signed)
PA denied. Does not meet medical necessity criteria.

## 2019-07-16 DIAGNOSIS — H8113 Benign paroxysmal vertigo, bilateral: Secondary | ICD-10-CM | POA: Diagnosis not present

## 2019-07-16 DIAGNOSIS — I951 Orthostatic hypotension: Secondary | ICD-10-CM | POA: Diagnosis not present

## 2019-07-16 DIAGNOSIS — R42 Dizziness and giddiness: Secondary | ICD-10-CM | POA: Diagnosis not present

## 2019-07-29 ENCOUNTER — Telehealth: Payer: Self-pay | Admitting: *Deleted

## 2019-07-29 DIAGNOSIS — R42 Dizziness and giddiness: Secondary | ICD-10-CM | POA: Diagnosis not present

## 2019-07-29 DIAGNOSIS — H8113 Benign paroxysmal vertigo, bilateral: Secondary | ICD-10-CM | POA: Diagnosis not present

## 2019-07-29 DIAGNOSIS — I951 Orthostatic hypotension: Secondary | ICD-10-CM | POA: Diagnosis not present

## 2019-07-29 NOTE — Telephone Encounter (Signed)
Copied from [place] [phone]. Topic: General - Other >> Jul 29, 2019  2:49 PM Celene Kras A wrote: Reason for CRM: Pt called stating she is needing authorization in order to have her appt for her lungs. Please advise.

## 2019-07-30 ENCOUNTER — Telehealth: Payer: Self-pay

## 2019-07-30 ENCOUNTER — Other Ambulatory Visit: Payer: Self-pay | Admitting: Family Medicine

## 2019-07-30 DIAGNOSIS — R911 Solitary pulmonary nodule: Secondary | ICD-10-CM

## 2019-07-30 DIAGNOSIS — I2699 Other pulmonary embolism without acute cor pulmonale: Secondary | ICD-10-CM

## 2019-07-30 DIAGNOSIS — J449 Chronic obstructive pulmonary disease, unspecified: Secondary | ICD-10-CM

## 2019-07-30 NOTE — Telephone Encounter (Signed)
I have placed referral

## 2019-07-30 NOTE — Telephone Encounter (Signed)
Spoke with patient she stated she wants to see a lung doctor, the last ref we placed she refused services due to seeing someone in [place] now she wants a new ref  Please advise

## 2019-07-30 NOTE — Telephone Encounter (Signed)
Copied from [place] [id number]. Topic: Referral - Request for Referral >> Jul 30, 2019  1:34 PM Rainey Pines A wrote: Has patient seen PCP for this complaint? {Yes *If NO, is insurance requiring patient see PCP for this issue before PCP can refer them? Referral for which specialty: Pulmonology Preferred provider/office:Dr. Consuelo Pandy Patient stated that she needs authroization for her upcoming lung appt on 09/30

## 2019-07-30 NOTE — Telephone Encounter (Signed)
I placed referral to doc requested

## 2019-08-06 DIAGNOSIS — R42 Dizziness and giddiness: Secondary | ICD-10-CM | POA: Diagnosis not present

## 2019-08-06 DIAGNOSIS — I951 Orthostatic hypotension: Secondary | ICD-10-CM | POA: Diagnosis not present

## 2019-08-06 DIAGNOSIS — H8113 Benign paroxysmal vertigo, bilateral: Secondary | ICD-10-CM | POA: Diagnosis not present

## 2019-08-12 DIAGNOSIS — I2699 Other pulmonary embolism without acute cor pulmonale: Secondary | ICD-10-CM | POA: Diagnosis not present

## 2019-08-12 DIAGNOSIS — R05 Cough: Secondary | ICD-10-CM | POA: Diagnosis not present

## 2019-08-12 DIAGNOSIS — N183 Chronic kidney disease, stage 3 (moderate): Secondary | ICD-10-CM | POA: Diagnosis not present

## 2019-08-12 DIAGNOSIS — Z87891 Personal history of nicotine dependence: Secondary | ICD-10-CM | POA: Diagnosis not present

## 2019-08-12 DIAGNOSIS — Z86711 Personal history of pulmonary embolism: Secondary | ICD-10-CM | POA: Diagnosis not present

## 2019-08-12 DIAGNOSIS — J449 Chronic obstructive pulmonary disease, unspecified: Secondary | ICD-10-CM | POA: Diagnosis not present

## 2019-08-12 DIAGNOSIS — I129 Hypertensive chronic kidney disease with stage 1 through stage 4 chronic kidney disease, or unspecified chronic kidney disease: Secondary | ICD-10-CM | POA: Diagnosis not present

## 2019-08-18 DIAGNOSIS — Z961 Presence of intraocular lens: Secondary | ICD-10-CM | POA: Diagnosis not present

## 2019-08-18 DIAGNOSIS — H04123 Dry eye syndrome of bilateral lacrimal glands: Secondary | ICD-10-CM | POA: Diagnosis not present

## 2019-08-20 DIAGNOSIS — H8113 Benign paroxysmal vertigo, bilateral: Secondary | ICD-10-CM | POA: Diagnosis not present

## 2019-08-20 DIAGNOSIS — R42 Dizziness and giddiness: Secondary | ICD-10-CM | POA: Diagnosis not present

## 2019-08-20 DIAGNOSIS — I951 Orthostatic hypotension: Secondary | ICD-10-CM | POA: Diagnosis not present

## 2019-09-09 ENCOUNTER — Other Ambulatory Visit: Payer: Self-pay | Admitting: Family Medicine

## 2019-09-09 MED ORDER — FLUTICASONE PROPIONATE 50 MCG/ACT NA SUSP: 2.0000 | Freq: Every day | NASAL | 5 refills | Status: DC

## 2019-09-09 MED ORDER — GABAPENTIN 300 MG PO CAPS: 300.0000 mg | ORAL_CAPSULE | Freq: Every day | ORAL | 1 refills | Status: DC

## 2019-09-09 MED ORDER — IPRATROPIUM-ALBUTEROL 0.5-2.5 (3) MG/3ML IN SOLN: RESPIRATORY_TRACT | 8 refills | Status: DC

## 2019-09-09 MED ORDER — COZAAR 50 MG PO TABS: 25.0000 mg | ORAL_TABLET | Freq: Every day | ORAL | 1 refills | Status: DC

## 2019-09-09 MED ORDER — PROPRANOLOL HCL ER 60 MG PO CP24: 60.0000 mg | ORAL_CAPSULE | Freq: Every day | ORAL | 1 refills | Status: DC

## 2019-09-09 MED ORDER — PROTONIX 40 MG PO TBEC: 40.0000 mg | DELAYED_RELEASE_TABLET | Freq: Two times a day (BID) | ORAL | 3 refills | Status: DC

## 2019-09-09 NOTE — Telephone Encounter (Signed)
Medication Refill - Medication: gabapentin (NEURONTIN) 300 MG capsule ipratropium-albuterol (DUONEB) 0.5-2.5 (3) MG/3ML SOLN propranolol ER (INDERAL LA) 60 MG 24 hr capsule fluticasone (FLONASE) 50 MCG/ACT nasal spray  COZAAR 50 MG tablet -Brand name specific PROTONIX 40 MG tablet - Brand name specific   Has the patient contacted their pharmacy? Yes.   (Agent: If no, request that the patient contact the pharmacy for the refill.) (Agent: If yes, when and what did the pharmacy advise?)  Preferred Pharmacy (with phone number or street name):  [place], [place]  [address] [place] [address]  Phone: [phone] Fax: [phone]     Agent: Please be advised that RX refills may take up to 3 business days. We ask that you follow-up with your pharmacy.

## 2019-09-21 ENCOUNTER — Telehealth: Payer: Self-pay | Admitting: Family Medicine

## 2019-09-21 MED ORDER — PROPRANOLOL HCL ER 60 MG PO CP24: 60.0000 mg | ORAL_CAPSULE | Freq: Every day | ORAL | 1 refills | Status: DC

## 2019-09-21 MED ORDER — LASIX 40 MG PO TABS: 40.0000 mg | ORAL_TABLET | Freq: Every day | ORAL | 1 refills | Status: DC

## 2019-09-21 MED ORDER — ALBUTEROL SULFATE HFA 108 (90 BASE) MCG/ACT IN AERS: INHALATION_SPRAY | RESPIRATORY_TRACT | 1 refills | Status: DC

## 2019-09-21 NOTE — Telephone Encounter (Signed)
[place], [place] denies receiving 10/28 request, patient requesting if Rx can be resent   Patient would like a short supply of LASIX 40 MG tablet and propranolol ER (INDERAL LA) 60 MG 24 hr capsule please send to retail and until patient received mail order.   [place], [place]

## 2019-09-21 NOTE — Telephone Encounter (Signed)
Patient stated that she needed propranolol and lasix sent local pharmacy cause she is almost out and then 90 day [facility] as well as albuterol inhaler.  Medications sent to appropriate places.

## 2019-09-21 NOTE — Telephone Encounter (Signed)
Christina Mckinney with [name] calling and states that insurance will not cover the LASIX 40 MG tablet  With the DAW on the prescription. Would need that taken off or new prescription sent for Furosemide. Please advise.  CB#: [phone]

## 2019-09-21 NOTE — Addendum Note (Signed)
Addended by: Kem Boroughs D on: 09/21/2019 04:47 PM   Modules accepted: Orders

## 2019-09-22 MED ORDER — FUROSEMIDE 40 MG PO TABS: 40.0000 mg | ORAL_TABLET | Freq: Every day | ORAL | 1 refills | Status: DC

## 2019-09-22 NOTE — Telephone Encounter (Signed)
Sent in [place] as not DAW

## 2019-09-29 ENCOUNTER — Other Ambulatory Visit: Payer: Self-pay

## 2019-09-29 ENCOUNTER — Telehealth: Payer: Self-pay

## 2019-09-29 MED ORDER — PEPCID 40 MG PO TABS: 40.0000 mg | ORAL_TABLET | Freq: Every day | ORAL | 3 refills | Status: DC

## 2019-09-29 MED ORDER — NEURONTIN 300 MG PO CAPS: 300.0000 mg | ORAL_CAPSULE | Freq: Every day | ORAL | 1 refills | Status: DC

## 2019-09-29 MED ORDER — ULORIC 40 MG PO TABS: 40.0000 mg | ORAL_TABLET | Freq: Every day | ORAL | 1 refills | Status: DC

## 2019-09-29 MED ORDER — LASIX 40 MG PO TABS: 40.0000 mg | ORAL_TABLET | Freq: Every day | ORAL | 1 refills | Status: DC

## 2019-09-29 MED ORDER — SYNTHROID 50 MCG PO TABS: 50.0000 ug | ORAL_TABLET | Freq: Every day | ORAL | 1 refills | Status: DC

## 2019-09-29 MED ORDER — PROTONIX 40 MG PO TBEC: 40.0000 mg | DELAYED_RELEASE_TABLET | Freq: Two times a day (BID) | ORAL | 3 refills | Status: DC

## 2019-09-29 MED ORDER — INDERAL LA 60 MG PO CP24: 60.0000 mg | ORAL_CAPSULE | Freq: Every day | ORAL | 1 refills | Status: DC

## 2019-09-29 MED ORDER — COZAAR 50 MG PO TABS: 25.0000 mg | ORAL_TABLET | Freq: Every day | ORAL | 1 refills | Status: DC

## 2019-09-29 MED ORDER — LOFIBRA 134 MG PO CAPS: 134.0000 mg | ORAL_CAPSULE | Freq: Every day | ORAL | 1 refills | Status: DC

## 2019-09-29 NOTE — Telephone Encounter (Signed)
I need to know which medication she is needing PA on.

## 2019-09-29 NOTE — Telephone Encounter (Signed)
Copied from [place] [phone]. Topic: General - Other >> Sep 28, 2019  2:06 PM Rainey Pines A wrote: Patient would like a callback from [facility] in regards to sending another pill prior [facility] .Please advise.     Kaylyn, Have you heard anything about her medications that you done a PA on, before I call her back  Please advise

## 2019-09-29 NOTE — Telephone Encounter (Signed)
All of them she likes name brands for every thing  Sorry

## 2019-09-29 NOTE — Telephone Encounter (Signed)
All medications have been resent to [facility] as brand name. They will let us know which medications need PA's.

## 2019-09-29 NOTE — Telephone Encounter (Signed)
Will have to work on over a course of several days.

## 2019-10-02 ENCOUNTER — Telehealth: Payer: Self-pay | Admitting: Family Medicine

## 2019-10-02 NOTE — Telephone Encounter (Signed)
So this is a fenofibrate so they need to tell us what strengths they are willing to cover and I will change her to a different strength

## 2019-10-02 NOTE — Telephone Encounter (Signed)
Please advise 

## 2019-10-02 NOTE — Telephone Encounter (Signed)
Ella from [facility] called and states that the LOFIBRA 134 MG capsule   has been discontinued.  They want to know if PCP wants to prescribe something different.

## 2019-10-06 ENCOUNTER — Telehealth: Payer: Self-pay

## 2019-10-06 ENCOUNTER — Other Ambulatory Visit: Payer: Self-pay | Admitting: Family Medicine

## 2019-10-06 MED ORDER — FENOFIBRATE 134 MG PO CAPS: 134.0000 mg | ORAL_CAPSULE | Freq: Every day | ORAL | 3 refills | Status: DC

## 2019-10-06 NOTE — Telephone Encounter (Signed)
Spoke with pharmacist at [facility]. All strengths of Arnell Asal are not available. She states generic fenofibrate, micronized capsules are available in 134mg  strength. If this is ok with PCP, please send RX. I have pended the generic RX.

## 2019-10-06 NOTE — Telephone Encounter (Signed)
PA initiated via Covermymeds; KEY: [id number]. Awaiting determination.

## 2019-10-06 NOTE — Telephone Encounter (Signed)
I sent it in 

## 2019-10-07 MED ORDER — INDERAL LA 60 MG PO CP24: 60.0000 mg | ORAL_CAPSULE | Freq: Every day | ORAL | 1 refills | Status: DC

## 2019-10-07 NOTE — Telephone Encounter (Signed)
No answer vm is full.

## 2019-10-07 NOTE — Telephone Encounter (Signed)
PA denied d/t medical necessity.

## 2019-10-07 NOTE — Telephone Encounter (Signed)
rx resent with brand medically necessary.  To see what comes back.  Patient notified.  She believes she has enough to get through the week.

## 2019-10-26 ENCOUNTER — Other Ambulatory Visit: Payer: Self-pay | Admitting: Family Medicine

## 2019-10-26 NOTE — Telephone Encounter (Signed)
Copied from [place] [phone]. Topic: Quick Communication - Rx Refill/Question >> Oct 26, 2019  4:11 PM Leward Quan A wrote: Medication: ALPRAZolam Duanne Moron) 0.5 MG tablet   Also patient say all medication sent in on 09/29/2019 were not received   Has the patient contacted their pharmacy? Yes.   (Agent: If no, request that the patient contact the pharmacy for the refill.) (Agent: If yes, when and what did the pharmacy advise?)  Preferred Pharmacy (with phone number or street name): [facility] - [place], [place]  Phone:  [phone] Fax:  [phone]     Agent: Please be advised that RX refills may take up to 3 business days. We ask that you follow-up with your pharmacy.

## 2019-10-27 MED ORDER — ALPRAZOLAM 0.5 MG PO TABS: 0.5000 mg | ORAL_TABLET | Freq: Every evening | ORAL | 1 refills | Status: DC | PRN

## 2019-10-27 NOTE — Telephone Encounter (Signed)
Requesting: xanax Contract:yes UDS: n/a Last OV:05/14/19 Next OV:11/16/19 Last Refill:07/09/20  #[id number] Database:   Please advise

## 2019-10-29 ENCOUNTER — Other Ambulatory Visit: Payer: Self-pay | Admitting: Family Medicine

## 2019-10-29 MED ORDER — INDERAL LA 60 MG PO CP24: 60.0000 mg | ORAL_CAPSULE | Freq: Every day | ORAL | 1 refills | Status: DC

## 2019-10-29 MED ORDER — COZAAR 50 MG PO TABS: 25.0000 mg | ORAL_TABLET | Freq: Every day | ORAL | 1 refills | Status: DC

## 2019-10-29 MED ORDER — LASIX 40 MG PO TABS: 40.0000 mg | ORAL_TABLET | Freq: Every day | ORAL | 1 refills | Status: DC

## 2019-10-29 MED ORDER — SYNTHROID 50 MCG PO TABS: 50.0000 ug | ORAL_TABLET | Freq: Every day | ORAL | 1 refills | Status: DC

## 2019-10-29 NOTE — Telephone Encounter (Signed)
Medication Refill - Medication: LASIX 40 MG tablet SYNTHROID 50 MCG tablet COZAAR 50 MG tablet INDERAL LA 60 MG 24 hr capsule    Preferred Pharmacy (with phone number or street name):  [facility], [place] Phone:  [phone]  Fax:  [phone]       Agent: Please be advised that RX refills may take up to 3 business days. We ask that you follow-up with your pharmacy.

## 2019-11-16 ENCOUNTER — Encounter: Payer: Self-pay | Admitting: Family Medicine

## 2019-11-16 ENCOUNTER — Ambulatory Visit (INDEPENDENT_AMBULATORY_CARE_PROVIDER_SITE_OTHER): Payer: Medicare Other | Admitting: Family Medicine

## 2019-11-16 ENCOUNTER — Telehealth: Payer: Self-pay | Admitting: *Deleted

## 2019-11-16 ENCOUNTER — Other Ambulatory Visit: Payer: Self-pay

## 2019-11-16 VITALS — BP 132/84 | HR 71 | Temp 97.6°F | Resp 18 | Wt 157.0 lb

## 2019-11-16 DIAGNOSIS — R739 Hyperglycemia, unspecified: Secondary | ICD-10-CM

## 2019-11-16 DIAGNOSIS — IMO0002 Reserved for concepts with insufficient information to code with codable children: Secondary | ICD-10-CM

## 2019-11-16 DIAGNOSIS — E569 Vitamin deficiency, unspecified: Secondary | ICD-10-CM

## 2019-11-16 DIAGNOSIS — N189 Chronic kidney disease, unspecified: Secondary | ICD-10-CM | POA: Diagnosis not present

## 2019-11-16 DIAGNOSIS — I5022 Chronic systolic (congestive) heart failure: Secondary | ICD-10-CM

## 2019-11-16 DIAGNOSIS — E039 Hypothyroidism, unspecified: Secondary | ICD-10-CM | POA: Diagnosis not present

## 2019-11-16 DIAGNOSIS — I10 Essential (primary) hypertension: Secondary | ICD-10-CM | POA: Diagnosis not present

## 2019-11-16 DIAGNOSIS — E782 Mixed hyperlipidemia: Secondary | ICD-10-CM

## 2019-11-16 DIAGNOSIS — T452X4S Poisoning by vitamins, undetermined, sequela: Secondary | ICD-10-CM

## 2019-11-16 DIAGNOSIS — D649 Anemia, unspecified: Secondary | ICD-10-CM | POA: Diagnosis not present

## 2019-11-16 DIAGNOSIS — R7989 Other specified abnormal findings of blood chemistry: Secondary | ICD-10-CM

## 2019-11-16 DIAGNOSIS — E119 Type 2 diabetes mellitus without complications: Secondary | ICD-10-CM

## 2019-11-16 DIAGNOSIS — J449 Chronic obstructive pulmonary disease, unspecified: Secondary | ICD-10-CM

## 2019-11-16 DIAGNOSIS — E673 Hypervitaminosis D: Secondary | ICD-10-CM

## 2019-11-16 DIAGNOSIS — K219 Gastro-esophageal reflux disease without esophagitis: Secondary | ICD-10-CM

## 2019-11-16 DIAGNOSIS — E213 Hyperparathyroidism, unspecified: Secondary | ICD-10-CM

## 2019-11-16 LAB — COMPREHENSIVE METABOLIC PANEL
ALT: 7 U/L (ref 0–35)
AST: 18 U/L (ref 0–37)
Albumin: 4.2 g/dL (ref 3.5–5.2)
Alkaline Phosphatase: 91 U/L (ref 39–117)
BUN: 28 mg/dL — ABNORMAL HIGH (ref 6–23)
CO2: 28 mEq/L (ref 19–32)
Calcium: 10.2 mg/dL (ref 8.4–10.5)
Chloride: 103 mEq/L (ref 96–112)
Creatinine, Ser: 1.37 mg/dL — ABNORMAL HIGH (ref 0.40–1.20)
GFR: 36.57 mL/min — ABNORMAL LOW (ref >60.00)
Glucose, Bld: 109 mg/dL — ABNORMAL HIGH (ref 70–99)
Potassium: 4.4 mEq/L (ref 3.5–5.1)
Sodium: 142 mEq/L (ref 135–145)
Total Bilirubin: 0.5 mg/dL (ref 0.2–1.2)
Total Protein: 6.7 g/dL (ref 6.0–8.3)

## 2019-11-16 LAB — CBC
HCT: 39.4 % (ref 36.0–46.0)
Hemoglobin: 12.8 g/dL (ref 12.0–15.0)
MCHC: 32.6 g/dL (ref 30.0–36.0)
MCV: 96.1 fl (ref 78.0–100.0)
Platelets: 209 10*3/uL (ref 150.0–400.0)
RBC: 4.1 Mil/uL (ref 3.87–5.11)
RDW: 14 % (ref 11.5–15.5)
WBC: 12.2 10*3/uL — ABNORMAL HIGH (ref 4.0–10.5)

## 2019-11-16 LAB — HEMOGLOBIN A1C: Hgb A1c MFr Bld: 5.8 % (ref 4.6–6.5)

## 2019-11-16 LAB — LIPID PANEL
Cholesterol: 217 mg/dL — ABNORMAL HIGH (ref 0–200)
HDL: 51.6 mg/dL (ref >39.00)
LDL Cholesterol: 136 mg/dL — ABNORMAL HIGH (ref 0–99)
NonHDL: 165.57
Total CHOL/HDL Ratio: 4
Triglycerides: 148 mg/dL (ref 0.0–149.0)
VLDL: 29.6 mg/dL (ref 0.0–40.0)

## 2019-11-16 LAB — TSH: TSH: 4.73 u[IU]/mL — ABNORMAL HIGH (ref 0.35–4.50)

## 2019-11-16 MED ORDER — COZAAR 50 MG PO TABS: 50.0000 mg | ORAL_TABLET | Freq: Every day | ORAL | 1 refills | Status: DC

## 2019-11-16 MED ORDER — INDERAL LA 60 MG PO CP24: 60.0000 mg | ORAL_CAPSULE | Freq: Every day | ORAL | 1 refills | Status: DC

## 2019-11-16 MED ORDER — PROTONIX 40 MG PO TBEC: 40.0000 mg | DELAYED_RELEASE_TABLET | Freq: Two times a day (BID) | ORAL | 3 refills | Status: DC

## 2019-11-16 MED ORDER — LASIX 40 MG PO TABS: 40.0000 mg | ORAL_TABLET | Freq: Every day | ORAL | 1 refills | Status: DC

## 2019-11-16 NOTE — Telephone Encounter (Signed)
Copied from [place] [id number]. Topic: General - Other >> Nov 16, 2019 12:31 PM Keene Breath wrote: Reason for CRM: Patient called to see if anyone found her hearing aid after her appt. Today.  CB# [phone]

## 2019-11-16 NOTE — Assessment & Plan Note (Signed)
On Levothyroxine, continue to monitor 

## 2019-11-16 NOTE — Patient Instructions (Signed)

## 2019-11-17 ENCOUNTER — Telehealth: Payer: Self-pay | Admitting: Family Medicine

## 2019-11-17 NOTE — Telephone Encounter (Signed)
No hearing aid found after she left, I have looked in the room that she was in

## 2019-11-17 NOTE — Telephone Encounter (Signed)
Called patient to schedule AWV, but no answer. Will try to call patient back at a later time.

## 2019-11-18 NOTE — Assessment & Plan Note (Signed)
Asymptomatic and stable 

## 2019-11-18 NOTE — Assessment & Plan Note (Signed)
Improved on recheck. She does not tolerate generic Inderal, Lasix or Cozaar it caused hypotension and disequilibrium. She is well controlled on brand name meds.

## 2019-11-18 NOTE — Assessment & Plan Note (Signed)
No recent exacerbations, no changes to therapy. Minimize sodium and monitor weight

## 2019-11-18 NOTE — Assessment & Plan Note (Signed)
Not controlled on generic PPI with bad nausea and hoarseness which she does not experience with Protonix brand name.

## 2019-11-18 NOTE — Assessment & Plan Note (Signed)
hgba1c acceptable, minimize simple carbs. Increase exercise as tolerated. Continue current meds 

## 2019-11-18 NOTE — Progress Notes (Signed)
Subjective:    Patient ID: Christina Mckinney, female    DOB: 1932-11-10, 86 y.o.   MRN: [id number]  No chief complaint on file.   HPI Patient is in today for follow up on chronic medical concerns including hypertension, diabetes, reflux and more. No recent febrile illness or hospitalizations. No polyuria or polydipsia. She feels well today but she was struggling with dizziness, falls, nausea andmore on generic meds over this past year. No other acute concerns. Denies CP/palp/SOB/HA/congestion/fevers/GI or GU c/o. Taking meds as prescribed  Past Medical History:  Diagnosis Date  . Anemia, unspecified   . Anxiety and depression 09/18/2015  . Anxiety state, unspecified   . Chest pain   . Chronic systolic CHF (congestive heart failure) ([place]) 08/12/2015  . Depression with anxiety 06/02/2007   Qualifier: Diagnosis of  By: Wynona Luna   . Depressive disorder, not elsewhere classified   . Diverticulitis   . DVT (deep venous thrombosis) ([place])   . Dyspnea 07/22/2015  . Edema 06/12/2014  . Essential and other specified forms of tremor   . GERD (gastroesophageal reflux disease)   . Gout, unspecified   . Internal hemorrhoids without mention of complication   . Macular degeneration 08/07/2015  . Osteoarthritis   . Other abnormal glucose   . Other and unspecified hyperlipidemia   . Personal history of colonic polyps   . Personal history of peptic ulcer disease   . Pulmonary embolism ([place])   . Unspecified disorder of bladder   . Unspecified essential hypertension   . Unspecified hypothyroidism   . Varicose veins     Past Surgical History:  Procedure Laterality Date  . ABDOMINAL HYSTERECTOMY    . CHOLECYSTECTOMY    . ENDOVENOUS ABLATION SAPHENOUS VEIN W/ LASER  09-11-2012   left greater saphenous vein   by Curt Jews MD  . EYE SURGERY    . KNEE ARTHROSCOPY     right   . NASAL SEPTUM SURGERY    . SHOULDER SURGERY    . TOTAL KNEE ARTHROPLASTY  09/2013   Right    Family History    Problem Relation Age of Onset  . Other Mother        varicose veins  . Hip fracture Mother   . Heart disease Father        CHF  . Hyperlipidemia Father   . Heart attack Father   . Diabetes Daughter   . Heart disease Daughter   . Hyperlipidemia Daughter   . Hypertension Daughter   . Aneurysm Daughter   . Cancer Sister        stomach  . Heart disease Sister   . Heart disease Brother   . Colon cancer Neg Hx     Social History   Socioeconomic History  . Marital status: Widowed    Spouse name: Not on file  . Number of children: 3  . Years of education: Not on file  . Highest education level: Not on file  Occupational History  . Occupation: Retired   Tobacco Use  . Smoking status: Former Smoker    Types: Cigarettes    Quit date: 11/12/1988    Years since quitting: 31.0  . Smokeless tobacco: Never Used  Substance and Sexual Activity  . Alcohol use: Yes    Alcohol/week: 4.0 standard drinks    Types: 4 Standard drinks or equivalent per week    Comment: occasional wine   . Drug use: No  . Sexual activity: Never  Comment: lives alone, no dietary restrictions, widowed  Other Topics Concern  . Not on file  Social History Narrative  . Not on file   Social Determinants of Health   Financial Resource Strain:   . Difficulty of Paying Living Expenses: Not on file  Food Insecurity:   . Worried About Charity fundraiser in the Last Year: Not on file  . Ran Out of Food in the Last Year: Not on file  Transportation Needs:   . Lack of Transportation (Medical): Not on file  . Lack of Transportation (Non-Medical): Not on file  Physical Activity:   . Days of Exercise per Week: Not on file  . Minutes of Exercise per Session: Not on file  Stress:   . Feeling of Stress : Not on file  Social Connections:   . Frequency of Communication with Friends and Family: Not on file  . Frequency of Social Gatherings with Friends and Family: Not on file  . Attends Religious Services: Not on  file  . Active Member of Clubs or Organizations: Not on file  . Attends Archivist Meetings: Not on file  . Marital Status: Not on file  Intimate Partner Violence:   . Fear of Current or Ex-Partner: Not on file  . Emotionally Abused: Not on file  . Physically Abused: Not on file  . Sexually Abused: Not on file    Outpatient Medications Prior to Visit  Medication Sig Dispense Refill  . albuterol (PROAIR HFA) 108 (90 Base) MCG/ACT inhaler USE 2 INHALATIONS EVERY 6 HOURS AS NEEDED FOR WHEEZING OR SHORTNESS OF BREATH 25.5 g 1  . ALPRAZolam (XANAX) 0.5 MG tablet Take 1 tablet (0.5 mg total) by mouth at bedtime as needed for anxiety. 90 tablet 1  . aspirin 81 MG tablet Take 1 tablet (81 mg total) by mouth daily. 30 tablet   . co-enzyme Q-10 30 MG capsule Take 30 mg by mouth daily.     . fexofenadine (ALLEGRA ALLERGY) 180 MG tablet Take 1 tablet (180 mg total) by mouth daily. 90 tablet 1  . fluticasone (FLONASE) 50 MCG/ACT nasal spray Place 2 sprays into both nostrils daily. USE 2 SPRAYS IN EACH NOSTRIL DAILY AS NEEDED FOR ALLERGIES OR RHINITIS 16 g 5  . fluticasone (FLOVENT HFA) 110 MCG/ACT inhaler Inhale 2 puffs into the lungs 2 (two) times daily. 3 Inhaler 3  . guaiFENesin (MUCINEX) 600 MG 12 hr tablet Take 1,200 mg by mouth 2 (two) times daily as needed for to loosen phlegm.     Marland Kitchen ipratropium-albuterol (DUONEB) 0.5-2.5 (3) MG/3ML SOLN USE THREE TIMES A DAY FOR NEXT 3 DAYS AND THEN AS NEEDED FOR WHEEZING. 60 mL 8  . NEURONTIN 300 MG capsule Take 1 capsule (300 mg total) by mouth at bedtime. 90 capsule 1  . PEPCID 40 MG tablet Take 1 tablet (40 mg total) by mouth daily. 90 tablet 3  . SYNTHROID 50 MCG tablet Take 1 tablet (50 mcg total) by mouth daily before breakfast. 90 tablet 1  . ULORIC 40 MG tablet Take 1 tablet (40 mg total) by mouth daily. 90 tablet 1  . COZAAR 50 MG tablet Take 0.5 tablets (25 mg total) by mouth daily. 90 tablet 1  . INDERAL LA 60 MG 24 hr capsule Take 1  capsule (60 mg total) by mouth daily. Brand Medically Necessary 90 capsule 1  . LASIX 40 MG tablet Take 1 tablet (40 mg total) by mouth daily. 90 tablet 1  .  PROTONIX 40 MG tablet Take 1 tablet (40 mg total) by mouth 2 (two) times daily before a meal. 180 tablet 3  . fenofibrate micronized (LOFIBRA) 134 MG capsule Take 1 capsule (134 mg total) by mouth daily before breakfast. 90 capsule 3  . meclizine (ANTIVERT) 25 MG tablet Take 1 tablet (25 mg total) by mouth 3 (three) times daily as needed for dizziness. 30 tablet 0  . montelukast (SINGULAIR) 10 MG tablet TAKE 1 TABLET AT BEDTIME 90 tablet 4  . PATADAY 0.2 % SOLN Place 1 drop into both eyes daily.     No facility-administered medications prior to visit.    Allergies  Allergen Reactions  . Simvastatin Other (See Comments)    Hip pain  . Crestor [Rosuvastatin]     Left hip pain  . Lipitor [Atorvastatin]     Pain in hips  . Red Dye     Hip pain  . Statins Other (See Comments)    Muscle aches, could not walk    Review of Systems  Constitutional: Negative for fever and malaise/fatigue.  HENT: Negative for congestion.   Eyes: Negative for blurred vision.  Respiratory: Negative for shortness of breath.   Cardiovascular: Negative for chest pain, palpitations and leg swelling.  Gastrointestinal: Negative for abdominal pain, blood in stool and nausea.  Genitourinary: Negative for dysuria and frequency.  Musculoskeletal: Negative for falls.  Skin: Negative for rash.  Neurological: Negative for dizziness, loss of consciousness and headaches.  Endo/Heme/Allergies: Negative for environmental allergies.  Psychiatric/Behavioral: Negative for depression. The patient is not nervous/anxious.        Objective:    Physical Exam Vitals and nursing note reviewed.  Constitutional:      General: She is not in acute distress.    Appearance: She is well-developed.  HENT:     Head: Normocephalic and atraumatic.     Nose: Nose normal.  Eyes:      General:        Right eye: No discharge.        Left eye: No discharge.  Cardiovascular:     Rate and Rhythm: Normal rate and regular rhythm.     Heart sounds: No murmur.  Pulmonary:     Effort: Pulmonary effort is normal.     Breath sounds: Normal breath sounds.  Abdominal:     General: Bowel sounds are normal.     Palpations: Abdomen is soft.     Tenderness: There is no abdominal tenderness.  Musculoskeletal:     Cervical back: Normal range of motion and neck supple.  Skin:    General: Skin is warm and dry.  Neurological:     Mental Status: She is alert and oriented to person, place, and time.     BP 132/84   Pulse 71   Temp 97.6 F (36.4 C) (Temporal)   Resp 18   Wt 157 lb (71.2 kg)   SpO2 98%   BMI 29.66 kg/m  Wt Readings from Last 3 Encounters:  11/16/19 157 lb (71.2 kg)  03/03/19 158 lb (71.7 kg)  02/05/19 145 lb (65.8 kg)    Diabetic Foot Exam - Simple   No data filed     Lab Results  Component Value Date   WBC 12.2 (H) 11/16/2019   HGB 12.8 11/16/2019   HCT 39.4 11/16/2019   PLT 209.0 11/16/2019   GLUCOSE 109 (H) 11/16/2019   CHOL 217 (H) 11/16/2019   TRIG 148.0 11/16/2019   HDL 51.60 11/16/2019  LDLDIRECT 83.0 05/21/2019   LDLCALC 136 (H) 11/16/2019   ALT 7 11/16/2019   AST 18 11/16/2019   NA 142 11/16/2019   K 4.4 11/16/2019   CL 103 11/16/2019   CREATININE 1.37 (H) 11/16/2019   BUN 28 (H) 11/16/2019   CO2 28 11/16/2019   TSH 4.73 (H) 11/16/2019   INR 1.0 07/21/2010   HGBA1C 5.8 11/16/2019   MICROALBUR 0.9 01/31/2016    Lab Results  Component Value Date   TSH 4.73 (H) 11/16/2019   Lab Results  Component Value Date   WBC 12.2 (H) 11/16/2019   HGB 12.8 11/16/2019   HCT 39.4 11/16/2019   MCV 96.1 11/16/2019   PLT 209.0 11/16/2019   Lab Results  Component Value Date   NA 142 11/16/2019   K 4.4 11/16/2019   CO2 28 11/16/2019   GLUCOSE 109 (H) 11/16/2019   BUN 28 (H) 11/16/2019   CREATININE 1.37 (H) 11/16/2019   BILITOT  0.5 11/16/2019   ALKPHOS 91 11/16/2019   AST 18 11/16/2019   ALT 7 11/16/2019   PROT 6.7 11/16/2019   ALBUMIN 4.2 11/16/2019   CALCIUM 10.2 11/16/2019   ANIONGAP 9 12/21/2018   GFR 36.57 (L) 11/16/2019   Lab Results  Component Value Date   CHOL 217 (H) 11/16/2019   Lab Results  Component Value Date   HDL 51.60 11/16/2019   Lab Results  Component Value Date   LDLCALC 136 (H) 11/16/2019   Lab Results  Component Value Date   TRIG 148.0 11/16/2019   Lab Results  Component Value Date   CHOLHDL 4 11/16/2019   Lab Results  Component Value Date   HGBA1C 5.8 11/16/2019       Assessment & Plan:   Problem List Items Addressed This Visit    Hypothyroidism    On Levothyroxine, continue to monitor      Relevant Medications   INDERAL LA 60 MG 24 hr capsule   Other Relevant Orders   TSH (Completed)   Hyperlipidemia, mixed - Primary   Relevant Medications   INDERAL LA 60 MG 24 hr capsule   COZAAR 50 MG tablet   LASIX 40 MG tablet   Other Relevant Orders   Lipid panel (Completed)   Anemia   Relevant Orders   CBC (Completed)   Essential hypertension    Improved on recheck. She does not tolerate generic Inderal, Lasix or Cozaar it caused hypotension and disequilibrium. She is well controlled on brand name meds.       Relevant Medications   INDERAL LA 60 MG 24 hr capsule   COZAAR 50 MG tablet   LASIX 40 MG tablet   GERD    Not controlled on generic PPI with bad nausea and hoarseness which she does not experience with Protonix brand name.       Relevant Medications   PROTONIX 40 MG tablet   Diabetes mellitus without complication (HCC)    [id number] acceptable, minimize simple carbs. Increase exercise as tolerated. Continue current meds      Relevant Medications   COZAAR 50 MG tablet   COPD (chronic obstructive pulmonary disease) (HCC)   Chronic systolic CHF (congestive heart failure) (H[facility])    No recent exacerbations, no changes to therapy. Minimize sodium and  monitor weight      Relevant Medications   INDERAL LA 60 MG 24 hr capsule   COZAAR 50 MG tablet   LASIX 40 MG tablet   High vitamin D level   Hyperparathyroidism (  [place])    Asymptomatic and stable      CRI (chronic renal insufficiency)   Relevant Orders   CMP (Completed)    Other Visit Diagnoses    Hyperglycemia       Relevant Orders   A1C (Completed)   High serum vitamin D       Vitamin disease       Poisoning by vitamin D, undetermined intent, sequela          I have discontinued Kailey S. Joyce's Pataday, montelukast, meclizine, and fenofibrate micronized. I am also having her maintain her co-enzyme Q-10, aspirin, fexofenadine, guaiFENesin, Flovent HFA, fluticasone, ipratropium-albuterol, albuterol, Pepcid, Uloric, Neurontin, ALPRAZolam, Synthroid, Protonix, Inderal LA, Cozaar, and Lasix.  Meds ordered this encounter  Medications  . DISCONTD: INDERAL LA 60 MG 24 hr capsule    Sig: Take 1 capsule (60 mg total) by mouth daily. Brand Medically Necessary    Dispense:  90 capsule    Refill:  1  . DISCONTD: LASIX 40 MG tablet    Sig: Take 1 tablet (40 mg total) by mouth daily.    Dispense:  90 tablet    Refill:  1  . DISCONTD: COZAAR 50 MG tablet    Sig: Take 1 tablet (50 mg total) by mouth daily.    Dispense:  90 tablet    Refill:  1  . DISCONTD: INDERAL LA 60 MG 24 hr capsule    Sig: Take 1 capsule (60 mg total) by mouth daily. Brand Medically Necessary    Dispense:  90 capsule    Refill:  1  . DISCONTD: COZAAR 50 MG tablet    Sig: Take 1 tablet (50 mg total) by mouth daily.    Dispense:  90 tablet    Refill:  1  . DISCONTD: LASIX 40 MG tablet    Sig: Take 1 tablet (40 mg total) by mouth daily.    Dispense:  90 tablet    Refill:  1  . DISCONTD: PROTONIX 40 MG tablet    Sig: Take 1 tablet (40 mg total) by mouth 2 (two) times daily before a meal.    Dispense:  180 tablet    Refill:  3    Nausea, hoarseness, reflux on generic Pantoprazole  . PROTONIX 40 MG tablet      Sig: Take 1 tablet (40 mg total) by mouth 2 (two) times daily before a meal.    Dispense:  180 tablet    Refill:  3    Nausea, hoarseness, reflux on generic Pantoprazole  . INDERAL LA 60 MG 24 hr capsule    Sig: Take 1 capsule (60 mg total) by mouth daily. Brand Medically Necessary    Dispense:  90 capsule    Refill:  1    Patient did not tolerate generic with low BP and disequilibrium  . COZAAR 50 MG tablet    Sig: Take 1 tablet (50 mg total) by mouth daily.    Dispense:  90 tablet    Refill:  1    Patient did not tolerate generic with low BP and disequilibrium  . LASIX 40 MG tablet    Sig: Take 1 tablet (40 mg total) by mouth daily.    Dispense:  90 tablet    Refill:  1    Patient did not tolerate generic with low BP and disequilibrium     [name], MD

## 2019-11-28 ENCOUNTER — Other Ambulatory Visit: Payer: Self-pay | Admitting: Family Medicine

## 2019-12-16 DIAGNOSIS — M19041 Primary osteoarthritis, right hand: Secondary | ICD-10-CM | POA: Diagnosis not present

## 2019-12-16 DIAGNOSIS — M79642 Pain in left hand: Secondary | ICD-10-CM | POA: Diagnosis not present

## 2019-12-16 DIAGNOSIS — M79641 Pain in right hand: Secondary | ICD-10-CM | POA: Diagnosis not present

## 2019-12-16 DIAGNOSIS — M778 Other enthesopathies, not elsewhere classified: Secondary | ICD-10-CM | POA: Diagnosis not present

## 2019-12-16 DIAGNOSIS — M19031 Primary osteoarthritis, right wrist: Secondary | ICD-10-CM | POA: Diagnosis not present

## 2019-12-16 DIAGNOSIS — M19032 Primary osteoarthritis, left wrist: Secondary | ICD-10-CM | POA: Diagnosis not present

## 2019-12-16 DIAGNOSIS — M19042 Primary osteoarthritis, left hand: Secondary | ICD-10-CM | POA: Diagnosis not present

## 2019-12-30 DIAGNOSIS — M19032 Primary osteoarthritis, left wrist: Secondary | ICD-10-CM | POA: Diagnosis not present

## 2019-12-30 DIAGNOSIS — M79641 Pain in right hand: Secondary | ICD-10-CM | POA: Diagnosis not present

## 2019-12-30 DIAGNOSIS — M778 Other enthesopathies, not elsewhere classified: Secondary | ICD-10-CM | POA: Diagnosis not present

## 2019-12-30 DIAGNOSIS — M79642 Pain in left hand: Secondary | ICD-10-CM | POA: Diagnosis not present

## 2019-12-30 DIAGNOSIS — M19031 Primary osteoarthritis, right wrist: Secondary | ICD-10-CM | POA: Diagnosis not present

## 2020-01-05 ENCOUNTER — Other Ambulatory Visit: Payer: Self-pay | Admitting: Orthopedic Surgery

## 2020-01-05 DIAGNOSIS — M778 Other enthesopathies, not elsewhere classified: Secondary | ICD-10-CM

## 2020-01-11 DIAGNOSIS — K625 Hemorrhage of anus and rectum: Secondary | ICD-10-CM | POA: Diagnosis not present

## 2020-01-15 ENCOUNTER — Ambulatory Visit
Admission: RE | Admit: 2020-01-15 | Discharge: 2020-01-15 | Disposition: A | Discharge status: Home / Self Care | Payer: Medicare Other | Source: Ambulatory Visit | Attending: Orthopedic Surgery | Admitting: Orthopedic Surgery

## 2020-01-15 ENCOUNTER — Other Ambulatory Visit: Payer: Self-pay

## 2020-01-15 DIAGNOSIS — M778 Other enthesopathies, not elsewhere classified: Secondary | ICD-10-CM

## 2020-01-15 DIAGNOSIS — M19031 Primary osteoarthritis, right wrist: Secondary | ICD-10-CM | POA: Diagnosis not present

## 2020-01-15 MED ORDER — GADOBENATE DIMEGLUMINE 529 MG/ML IV SOLN: 20.0000 mL | Freq: Once | INTRAVENOUS | Status: DC | PRN

## 2020-01-20 DIAGNOSIS — M19031 Primary osteoarthritis, right wrist: Secondary | ICD-10-CM | POA: Diagnosis not present

## 2020-01-20 DIAGNOSIS — M19032 Primary osteoarthritis, left wrist: Secondary | ICD-10-CM | POA: Diagnosis not present

## 2020-01-20 DIAGNOSIS — M778 Other enthesopathies, not elsewhere classified: Secondary | ICD-10-CM | POA: Diagnosis not present

## 2020-02-01 DIAGNOSIS — K219 Gastro-esophageal reflux disease without esophagitis: Secondary | ICD-10-CM | POA: Diagnosis not present

## 2020-02-01 DIAGNOSIS — Z800 Family history of malignant neoplasm of digestive organs: Secondary | ICD-10-CM | POA: Diagnosis not present

## 2020-02-25 ENCOUNTER — Other Ambulatory Visit: Payer: Self-pay

## 2020-02-25 MED ORDER — ULORIC 40 MG PO TABS: 40.0000 mg | ORAL_TABLET | Freq: Every day | ORAL | 1 refills | Status: DC

## 2020-02-25 MED ORDER — SYNTHROID 50 MCG PO TABS: 50.0000 ug | ORAL_TABLET | Freq: Every day | ORAL | 1 refills | Status: DC

## 2020-02-25 MED ORDER — PROPRANOLOL HCL ER 60 MG PO CP24: 60.0000 mg | ORAL_CAPSULE | Freq: Every day | ORAL | 0 refills | Status: DC

## 2020-02-25 MED ORDER — NEURONTIN 300 MG PO CAPS: 300.0000 mg | ORAL_CAPSULE | Freq: Every day | ORAL | 1 refills | Status: DC

## 2020-02-25 MED ORDER — PROTONIX 40 MG PO TBEC: 40.0000 mg | DELAYED_RELEASE_TABLET | Freq: Two times a day (BID) | ORAL | 3 refills | Status: DC

## 2020-02-25 NOTE — Telephone Encounter (Signed)
Medication sent.

## 2020-02-25 NOTE — Telephone Encounter (Signed)
Patient called in to see if Dr. Charlett Blake could send in a prescription for  ULORIC 40 MG tablet [id number]   NEURONTIN 300 MG capsule [id number]    PROTONIX 40 MG tablet [id number]    SYNTHROID 50 MCG tablet [id number]    propranolol ER (INDERAL LA) 60 MG 24 hr capsule [id number]   Please send it to [facility], [place]  [address] [place] [address]  Phone:  [phone] Fax:  [phone]  DEA #:  --

## 2020-02-29 ENCOUNTER — Telehealth: Payer: Self-pay

## 2020-02-29 NOTE — Telephone Encounter (Signed)
PA form received from [place]. Form completed and faxed to [phone]. Awaiting determination.

## 2020-02-29 NOTE — Telephone Encounter (Signed)
PA form received from [place]. Form completed and faxed back to [phone]. Awaiting determination.

## 2020-03-04 ENCOUNTER — Telehealth: Payer: Self-pay

## 2020-03-04 NOTE — Telephone Encounter (Signed)
Left message on machine to call back  Insurance is not covering any of her brand name medications again

## 2020-03-04 NOTE — Telephone Encounter (Signed)
Ins not covering brand name again

## 2020-03-10 ENCOUNTER — Telehealth: Payer: Self-pay | Admitting: *Deleted

## 2020-03-10 NOTE — Telephone Encounter (Signed)
Left message on machine to discuss.  Insurance does not want to cover brand name Neurontin and Uloric.  Need to find out reasoning that she is unable to take generic for these two medications.

## 2020-03-11 ENCOUNTER — Other Ambulatory Visit: Payer: Self-pay | Admitting: Family Medicine

## 2020-03-11 ENCOUNTER — Other Ambulatory Visit: Payer: Self-pay | Admitting: *Deleted

## 2020-03-11 MED ORDER — GABAPENTIN 300 MG PO CAPS: 300.0000 mg | ORAL_CAPSULE | Freq: Every day | ORAL | 1 refills | Status: DC

## 2020-03-11 MED ORDER — FEBUXOSTAT 40 MG PO TABS: 40.0000 mg | ORAL_TABLET | Freq: Every day | ORAL | 1 refills | Status: DC

## 2020-03-11 MED ORDER — SUCRALFATE 1 G PO TABS: 1.0000 g | ORAL_TABLET | Freq: Four times a day (QID) | ORAL | 2 refills | Status: DC

## 2020-03-11 NOTE — Telephone Encounter (Signed)
sent in the Carafate.

## 2020-03-11 NOTE — Telephone Encounter (Signed)
Patient would like carfate sent in.  It was prescribe by the GI.  Are you ok with taking over and refilling it?   I updated her med list and it is now on there.   Patient ok with sending and trying generic for neurontin and urloric.  Rxs sent in.

## 2020-03-16 DIAGNOSIS — I6529 Occlusion and stenosis of unspecified carotid artery: Secondary | ICD-10-CM | POA: Diagnosis not present

## 2020-03-16 DIAGNOSIS — N958 Other specified menopausal and perimenopausal disorders: Secondary | ICD-10-CM | POA: Diagnosis not present

## 2020-03-16 DIAGNOSIS — M8588 Other specified disorders of bone density and structure, other site: Secondary | ICD-10-CM | POA: Diagnosis not present

## 2020-03-16 DIAGNOSIS — Z6827 Body mass index (BMI) 27.0-27.9, adult: Secondary | ICD-10-CM | POA: Diagnosis not present

## 2020-03-16 DIAGNOSIS — Z1231 Encounter for screening mammogram for malignant neoplasm of breast: Secondary | ICD-10-CM | POA: Diagnosis not present

## 2020-03-16 DIAGNOSIS — Z01419 Encounter for gynecological examination (general) (routine) without abnormal findings: Secondary | ICD-10-CM | POA: Diagnosis not present

## 2020-03-17 ENCOUNTER — Other Ambulatory Visit: Payer: Self-pay | Admitting: *Deleted

## 2020-03-17 ENCOUNTER — Telehealth: Payer: Self-pay | Admitting: Family Medicine

## 2020-03-17 ENCOUNTER — Telehealth: Payer: Self-pay

## 2020-03-17 MED ORDER — SUCRALFATE 1 G PO TABS: 1.0000 g | ORAL_TABLET | Freq: Four times a day (QID) | ORAL | 1 refills | Status: DC

## 2020-03-17 MED ORDER — COZAAR 50 MG PO TABS: 50.0000 mg | ORAL_TABLET | Freq: Every day | ORAL | 1 refills | Status: DC

## 2020-03-17 MED ORDER — PROPRANOLOL HCL ER 60 MG PO CP24: 60.0000 mg | ORAL_CAPSULE | Freq: Every day | ORAL | 1 refills | Status: DC

## 2020-03-17 NOTE — Telephone Encounter (Signed)
Caller : Kennyth Lose -[facility]  Call Back # [phone] Ref # [id number]   Patient has a red dye allergy, per pharmacy this pill has red in it.    Please advise   propranolol ER (INDERAL LA) 60 MG 24 hr capsule [id number]

## 2020-03-17 NOTE — Telephone Encounter (Signed)
NOTES ON FILE FROM [facility] [phone], SENT REFERRAL TO SCHEDULING 

## 2020-03-21 MED ORDER — PROPRANOLOL HCL ER 60 MG PO CP24: 60.0000 mg | ORAL_CAPSULE | Freq: Every day | ORAL | 1 refills | Status: DC

## 2020-03-21 NOTE — Telephone Encounter (Signed)
Spoke with pharmacist and they stated that their brand name Inderal has red dye.   Advised them that I sent in the generic.  They stated pt always have gotten generic from them.  Sent in new rx for generic Inderal.

## 2020-03-23 ENCOUNTER — Telehealth: Payer: Self-pay | Admitting: Family Medicine

## 2020-03-23 MED ORDER — LASIX 40 MG PO TABS: 40.0000 mg | ORAL_TABLET | Freq: Every day | ORAL | 1 refills | Status: DC

## 2020-03-23 NOTE — Telephone Encounter (Signed)
Patient notified rx has been sent in.

## 2020-03-23 NOTE — Telephone Encounter (Signed)
Medication: LASIX 40 MG tablet [id number]    Has the patient contacted their pharmacy? No. (If no, request that the patient contact the pharmacy for the refill.) (If yes, when and what did the pharmacy advise?)  Preferred Pharmacy (with phone number or street name): [place] [facility], [place]  [address], [place] [address]  Phone:  [phone] Fax:  [phone]  DEA #:  --  Agent: Please be advised that RX refills may take up to 3 business days. We ask that you follow-up with your pharmacy.

## 2020-03-25 ENCOUNTER — Telehealth: Payer: Self-pay

## 2020-03-25 NOTE — Telephone Encounter (Signed)
Received PA form from [facility]. Form completed and faxed back to [phone]. Awaiting determination.

## 2020-03-28 NOTE — Telephone Encounter (Signed)
PA denied. Does not meet [employer] criteria.

## 2020-03-30 ENCOUNTER — Encounter: Payer: Self-pay | Admitting: *Deleted

## 2020-03-31 NOTE — Telephone Encounter (Signed)
Appeal letter faxed to insurance.

## 2020-04-03 NOTE — Progress Notes (Signed)
Cardiology Office Note:   Date:  04/05/2020  NAME:  Christina Mckinney    MRN: [id number] DOB:  14-Apr-1932   PCP:  Mosie Lukes, MD  Cardiologist:  No primary care provider on file.   Referring MD: Arvella Nigh, MD   Chief Complaint  Patient presents with  . New Patient (Initial Visit)  . Palpitations   History of Present Illness:   Christina Mckinney is a 85 y.o. female with a hx of carotid arterty disease, HTN, HLD who presents for evaluation of carotid artery at the request of Mosie Lukes, MD.  She reports she was followed by cardiology in the past.  She was noted to have bilateral carotid artery disease.  The stenosis severity was 1-39%.  She reports she is concerned about this.  She would like to be reevaluated.  She has no evidence of carotid bruits on examination.  She also reports that she gets palpitations nightly.  She reports for the last 2 months every night she gets a sensation of a rapid heartbeat as well as a warm sensation in her legs.  She is quite concerned about her heart.  Symptoms appear to occur in the middle of the night.  No associated trigger.  They do occur while she is lying down.  They go away without intervention.  She reports no chest pain or shortness of breath with this.  She does report that she has varicose veins and has had treatment for this in the past.  Her most recent TSH from January was 4.73.  She does take thyroid supplementation.  She does report that she is intolerant of statin medications.  Her most recent LDL cholesterol is 136.  She also reports she is not interested in taking Zetia.  She does take a baby aspirin.  She denies chest pain or shortness of breath.  She is not active.  She does have no limitations with her current level of activity.  This mainly occurs going to the grocery store and doing shopping and things around the house.  Problem List 1. Carotid artery stenosis -bilateral 1-39% 2. HTN 3. HLD -T chol 217, HDL 51, LDL 136, TG  148 -A1c 5.8  Past Medical History: Past Medical History:  Diagnosis Date  . Anemia, unspecified   . Anxiety and depression 09/18/2015  . Anxiety state, unspecified   . Chest pain   . Chronic systolic CHF (congestive heart failure) ([place]) 08/12/2015  . Depression with anxiety 06/02/2007   Qualifier: Diagnosis of  By: Wynona Luna   . Depressive disorder, not elsewhere classified   . Diverticulitis   . DVT (deep venous thrombosis) ([place])   . Dyspnea 07/22/2015  . Edema 06/12/2014  . Essential and other specified forms of tremor   . GERD (gastroesophageal reflux disease)   . Gout, unspecified   . Internal hemorrhoids without mention of complication   . Macular degeneration 08/07/2015  . Osteoarthritis   . Other abnormal glucose   . Other and unspecified hyperlipidemia   . Personal history of colonic polyps   . Personal history of peptic ulcer disease   . Pulmonary embolism ([place])   . Unspecified disorder of bladder   . Unspecified essential hypertension   . Unspecified hypothyroidism   . Varicose veins     Past Surgical History: Past Surgical History:  Procedure Laterality Date  . ABDOMINAL HYSTERECTOMY    . CHOLECYSTECTOMY    . ENDOVENOUS ABLATION SAPHENOUS VEIN W/ LASER  09-11-2012  left greater saphenous vein   by Curt Jews MD  . EYE SURGERY    . KNEE ARTHROSCOPY     right   . NASAL SEPTUM SURGERY    . SHOULDER SURGERY    . TOTAL KNEE ARTHROPLASTY  09/2013   Right    Current Medications: Current Meds  Medication Sig  . albuterol (PROAIR HFA) 108 (90 Base) MCG/ACT inhaler USE 2 INHALATIONS EVERY 6 HOURS AS NEEDED FOR WHEEZING OR SHORTNESS OF BREATH  . ALPRAZolam (XANAX) 0.5 MG tablet Take 1 tablet (0.5 mg total) by mouth at bedtime as needed for anxiety.  Marland Kitchen aspirin 81 MG tablet Take 1 tablet (81 mg total) by mouth daily.  Marland Kitchen co-enzyme Q-10 30 MG capsule Take 30 mg by mouth daily.   Marland Kitchen COZAAR 50 MG tablet Take 1 tablet (50 mg total) by mouth daily.  . febuxostat  (ULORIC) 40 MG tablet Take 1 tablet (40 mg total) by mouth daily.  . fexofenadine (ALLEGRA ALLERGY) 180 MG tablet Take 1 tablet (180 mg total) by mouth daily.  . fluticasone (FLONASE) 50 MCG/ACT nasal spray Place 2 sprays into both nostrils daily. USE 2 SPRAYS IN EACH NOSTRIL DAILY AS NEEDED FOR ALLERGIES OR RHINITIS  . fluticasone (FLOVENT HFA) 110 MCG/ACT inhaler Inhale 2 puffs into the lungs 2 (two) times daily.  . furosemide (LASIX) 40 MG tablet Take 1 tablet by mouth once daily  . gabapentin (NEURONTIN) 300 MG capsule Take 1 capsule (300 mg total) by mouth at bedtime.  Marland Kitchen guaiFENesin (MUCINEX) 600 MG 12 hr tablet Take 1,200 mg by mouth 2 (two) times daily as needed for to loosen phlegm.   Marland Kitchen ipratropium-albuterol (DUONEB) 0.5-2.5 (3) MG/3ML SOLN USE THREE TIMES A DAY FOR NEXT 3 DAYS AND THEN AS NEEDED FOR WHEEZING.  Marland Kitchen propranolol ER (INDERAL LA) 60 MG 24 hr capsule Take 1 capsule (60 mg total) by mouth daily.  Marland Kitchen PROTONIX 40 MG tablet Take 1 tablet (40 mg total) by mouth 2 (two) times daily before a meal.  . sucralfate (CARAFATE) 1 g tablet Take 1 tablet (1 g total) by mouth in the morning, at noon, in the evening, and at bedtime.  Marland Kitchen SYNTHROID 50 MCG tablet Take 1 tablet (50 mcg total) by mouth daily before breakfast.  . [DISCONTINUED] PEPCID 40 MG tablet Take 1 tablet (40 mg total) by mouth daily.     Allergies:    Simvastatin, Crestor [rosuvastatin], Lipitor [atorvastatin], Red dye, and Statins   Social History: Social History   Socioeconomic History  . Marital status: Widowed    Spouse name: Not on file  . Number of children: 3  . Years of education: Not on file  . Highest education level: Not on file  Occupational History  . Occupation: Retired   Tobacco Use  . Smoking status: Former Smoker    Years: 40.00    Types: Cigarettes    Quit date: 11/12/1988    Years since quitting: 31.4  . Smokeless tobacco: Never Used  Substance and Sexual Activity  . Alcohol use: Yes     Alcohol/week: 4.0 standard drinks    Types: 4 Standard drinks or equivalent per week    Comment: occasional wine   . Drug use: No  . Sexual activity: Never    Comment: lives alone, no dietary restrictions, widowed  Other Topics Concern  . Not on file  Social History Narrative  . Not on file   Social Determinants of Health   Financial Resource Strain:   .  Difficulty of Paying Living Expenses:   Food Insecurity:   . Worried About Charity fundraiser in the Last Year:   . Arboriculturist in the Last Year:   Transportation Needs:   . Film/video editor (Medical):   Marland Kitchen Lack of Transportation (Non-Medical):   Physical Activity:   . Days of Exercise per Week:   . Minutes of Exercise per Session:   Stress:   . Feeling of Stress :   Social Connections:   . Frequency of Communication with Friends and Family:   . Frequency of Social Gatherings with Friends and Family:   . Attends Religious Services:   . Active Member of Clubs or Organizations:   . Attends Archivist Meetings:   Marland Kitchen Marital Status:      Family History: The patient's family history includes Aneurysm in her daughter; Cancer in her sister; Diabetes in her daughter; Heart attack in her father; Heart disease in her brother, daughter, father, and sister; Hip fracture in her mother; Hyperlipidemia in her daughter and father; Hypertension in her daughter; Other in her mother. There is no history of Colon cancer.  ROS:   All other ROS reviewed and negative. Pertinent positives noted in the HPI.     EKGs/Labs/Other Studies Reviewed:   The following studies were personally reviewed by me today:  EKG:  EKG is ordered today.  The ekg ordered today demonstrates normal sinus rhythm, heart rate 70, no acute ST-T changes, no evidence of prior infarction, personally reviewed by me, and was personally reviewed by me.   TTE 05/02/2017 Left ventricle: The cavity size was normal. Wall thickness was  increased in a pattern  of mild LVH. Systolic function was normal.  The estimated ejection fraction was in the range of 55% to 60%.  Wall motion was normal; there were no regional wall motion  abnormalities. Doppler parameters are consistent with abnormal  left ventricular relaxation (grade 1 diastolic dysfunction).  - Aortic valve: There was no stenosis.  - Mitral valve: Mildly calcified annulus. There was no significant  regurgitation.  - Left atrium: The atrium was mildly dilated.  - Right ventricle: The cavity size was normal. Systolic function  was normal.  - Pulmonary arteries: PA peak pressure: 29 mm Hg (S).  - Systemic veins: IVC measured 2.2 cm with normal respirophasic  variation, suggesting RA pressure 8 mmHg.   Carotid US 10/18/20218 Final Interpretation:  Right Carotid: There is evidence in the right ICA of a 1-39% stenosis.   Left Carotid: There is evidence in the left ICA of a 1-39% stenosis.   Vertebrals: Both vertebral arteries were patent with antegrade flow.  Dampened        waveform and low flow velocity in the left vertebral.  Subclavians: Normal flow hemodynamics were seen in bilateral subclavian        arteries.   *See table(s) above for measurements and observations.   Recent Labs: 11/16/2019: ALT 7; BUN 28; Creatinine, Ser 1.37; Hemoglobin 12.8; Platelets 209.0; Potassium 4.4; Sodium 142; TSH 4.73   Recent Lipid Panel    Component Value Date/Time   CHOL 217 (H) 11/16/2019 1042   TRIG 148.0 11/16/2019 1042   HDL 51.60 11/16/2019 1042   CHOLHDL 4 11/16/2019 1042   VLDL 29.6 11/16/2019 1042   LDLCALC 136 (H) 11/16/2019 1042   LDLDIRECT 83.0 05/21/2019 1511    Physical Exam:   VS:  BP 134/72   Pulse 70   Ht 5\' 1"  (1.549  m)   Wt 149 lb (67.6 kg)   SpO2 98%   BMI 28.15 kg/m    Wt Readings from Last 3 Encounters:  04/05/20 149 lb (67.6 kg)  11/16/19 157 lb (71.2 kg)  03/03/19 158 lb (71.7 kg)    General: Well nourished, well developed, in  no acute distress Heart: Atraumatic, normal size  Eyes: PEERLA, EOMI  Neck: Supple, no JVD Endocrine: No thryomegaly Cardiac: Normal S1, S2; RRR; no murmurs, rubs, or gallops Lungs: Clear to auscultation bilaterally, no wheezing, rhonchi or rales  Abd: Soft, nontender, no hepatomegaly  Ext: No edema, venous insufficiency changes noted Musculoskeletal: No deformities, BUE and BLE strength normal and equal Skin: Warm and dry, no rashes   Neuro: Alert and oriented to person, place, time, and situation, CNII-XII grossly intact, no focal deficits  Psych: Normal mood and affect   ASSESSMENT:   Christina Mckinney is a 86 y.o. female who presents for the following: 1. Bilateral carotid artery stenosis   2. Palpitations   3. Essential hypertension   4. Mixed hyperlipidemia     PLAN:   1. Bilateral carotid artery stenosis -Carotid ultrasounds with minimal disease bilaterally in 2017.  No bruits on examination.  I suspect she has no significant disease.  We will repeat these.  2. Palpitations -Palpitations that occur nightly.  Unclear.  Could be arrhythmia.  Most recent thyroid studies were normal.  She is on thyroid supplementation.  This is checked periodically.  We will proceed with a 3-day Zio patch to exclude any arrhythmia.  She did have a normal echocardiogram a few years ago.  She has no evidence of congestive heart failure examination.  Her EKG today normal sinus rhythm without acute changes.  Overall have low suspicion for arrhythmia but we will exclude this with a Zio patch.  3. Essential hypertension -Well-controlled.  Continue current medications.  4. Mixed hyperlipidemia -Most recent cholesterol shows an LDL of 134.  Given her minimal carotid artery disease she should likely be on a statin.  She is statin intolerant.  She does not have interested in Zetia.  We will just keep an eye on it for now.  Disposition: Return in about 2 months (around 06/05/2020).  Medication  Adjustments/Labs and Tests Ordered: Current medicines are reviewed at length with the patient today.  Concerns regarding medicines are outlined above.  Orders Placed This Encounter  Procedures  . LONG TERM MONITOR (3-14 DAYS)  . EKG 12-Lead  . VAS US CAROTID   No orders of the defined types were placed in this encounter.   Patient Instructions  Medication Instructions:  The current medical regimen is effective;  continue present plan and medications.  *If you need a refill on your cardiac medications before your next appointment, please call your pharmacy*   Testing/Procedures: Your physician has requested that you have a carotid duplex. This test is an ultrasound of the carotid arteries in your neck. It looks at blood flow through these arteries that supply the brain with blood. Allow one hour for this exam. There are no restrictions or special instructions.  Your physician has recommended that you wear a 3 DAY ZIO-PATCH monitor. The Zio patch cardiac monitor continuously records heart rhythm data for up to 14 days, this is for patients being evaluated for multiple types heart rhythms. For the first 24 hours post application, please avoid getting the Zio monitor wet in the shower or by excessive sweating during exercise. After that, feel free to carry on  with regular activities. Keep soaps and lotions away from the ZIO XT Patch.  This will be mailed to you, please expect 7-10 days to receive.          Follow-Up: At [facility], you and your health needs are our priority.  As part of our continuing mission to provide you with exceptional heart care, we have created designated Provider Care Teams.  These Care Teams include your primary Cardiologist (physician) and Advanced Practice Providers (APPs -  Physician Assistants and Nurse Practitioners) who all work together to provide you with the care you need, when you need it.  We recommend signing up for the patient portal called  "MyChart".  Sign up information is provided on this After Visit Summary.  MyChart is used to connect with patients for Virtual Visits (Telemedicine).  Patients are able to view lab/test results, encounter notes, upcoming appointments, etc.  Non-urgent messages can be sent to your provider as well.   To learn more about what you can do with MyChart, go to [email or web address].    Your next appointment:   2 month(s)  The format for your next appointment:   In Person  Provider:   Eleonore Chiquito, MD        Signed, Addison Naegeli. Audie Box, [place]  [address], [place] [employer] [place] [employer], [place] [address] [phone]  04/05/2020 3:01 PM

## 2020-04-04 ENCOUNTER — Other Ambulatory Visit: Payer: Self-pay | Admitting: Family Medicine

## 2020-04-05 ENCOUNTER — Ambulatory Visit (INDEPENDENT_AMBULATORY_CARE_PROVIDER_SITE_OTHER): Payer: Medicare Other | Admitting: Cardiovascular Disease

## 2020-04-05 ENCOUNTER — Encounter: Payer: Self-pay | Admitting: Cardiovascular Disease

## 2020-04-05 ENCOUNTER — Other Ambulatory Visit: Payer: Self-pay

## 2020-04-05 ENCOUNTER — Telehealth: Payer: Self-pay | Admitting: Radiology

## 2020-04-05 VITALS — BP 134/72 | HR 70 | Ht 61.0 in | Wt 149.0 lb

## 2020-04-05 DIAGNOSIS — E782 Mixed hyperlipidemia: Secondary | ICD-10-CM

## 2020-04-05 DIAGNOSIS — I10 Essential (primary) hypertension: Secondary | ICD-10-CM

## 2020-04-05 DIAGNOSIS — I6523 Occlusion and stenosis of bilateral carotid arteries: Secondary | ICD-10-CM

## 2020-04-05 DIAGNOSIS — R002 Palpitations: Secondary | ICD-10-CM

## 2020-04-05 NOTE — Patient Instructions (Signed)
Medication Instructions:  The current medical regimen is effective;  continue present plan and medications.  *If you need a refill on your cardiac medications before your next appointment, please call your pharmacy*   Testing/Procedures: Your physician has requested that you have a carotid duplex. This test is an ultrasound of the carotid arteries in your neck. It looks at blood flow through these arteries that supply the brain with blood. Allow one hour for this exam. There are no restrictions or special instructions.  Your physician has recommended that you wear a 3 DAY ZIO-PATCH monitor. The Zio patch cardiac monitor continuously records heart rhythm data for up to 14 days, this is for patients being evaluated for multiple types heart rhythms. For the first 24 hours post application, please avoid getting the Zio monitor wet in the shower or by excessive sweating during exercise. After that, feel free to carry on with regular activities. Keep soaps and lotions away from the ZIO XT Patch.  This will be mailed to you, please expect 7-10 days to receive.          Follow-Up: At [facility], you and your health needs are our priority.  As part of our continuing mission to provide you with exceptional heart care, we have created designated Provider Care Teams.  These Care Teams include your primary Cardiologist (physician) and Advanced Practice Providers (APPs -  Physician Assistants and Nurse Practitioners) who all work together to provide you with the care you need, when you need it.  We recommend signing up for the patient portal called "MyChart".  Sign up information is provided on this After Visit Summary.  MyChart is used to connect with patients for Virtual Visits (Telemedicine).  Patients are able to view lab/test results, encounter notes, upcoming appointments, etc.  Non-urgent messages can be sent to your provider as well.   To learn more about what you can do with MyChart, go to  [email or web address].    Your next appointment:   2 month(s)  The format for your next appointment:   In Person  Provider:   Eleonore Chiquito, MD

## 2020-04-05 NOTE — Telephone Encounter (Signed)
Enrolled patient for a 3 day Zio monitor to be mailed to patients home.  

## 2020-04-08 ENCOUNTER — Telehealth: Payer: Self-pay | Admitting: *Deleted

## 2020-04-08 NOTE — Telephone Encounter (Signed)
Left message on machine that I need her to come by to sign consent form for [place] to send appeal letter for brand name lasix to insurance company.

## 2020-04-10 ENCOUNTER — Ambulatory Visit (INDEPENDENT_AMBULATORY_CARE_PROVIDER_SITE_OTHER): Payer: Medicare Other

## 2020-04-10 DIAGNOSIS — R002 Palpitations: Secondary | ICD-10-CM | POA: Diagnosis not present

## 2020-04-15 ENCOUNTER — Other Ambulatory Visit: Payer: Self-pay

## 2020-04-15 ENCOUNTER — Ambulatory Visit (HOSPITAL_COMMUNITY)
Admission: RE | Admit: 2020-04-15 | Discharge: 2020-04-15 | Disposition: A | Discharge status: Home / Self Care | Payer: Medicare Other | Source: Ambulatory Visit | Attending: Cardiovascular Disease | Admitting: Cardiovascular Disease

## 2020-04-15 DIAGNOSIS — I6523 Occlusion and stenosis of bilateral carotid arteries: Secondary | ICD-10-CM | POA: Diagnosis not present

## 2020-04-25 ENCOUNTER — Other Ambulatory Visit: Payer: Self-pay | Admitting: Family Medicine

## 2020-04-25 MED ORDER — FUROSEMIDE 40 MG PO TABS: 40.0000 mg | ORAL_TABLET | Freq: Every day | ORAL | 0 refills | Status: DC

## 2020-04-25 MED ORDER — PROPRANOLOL HCL ER 60 MG PO CP24: 60.0000 mg | ORAL_CAPSULE | Freq: Every day | ORAL | 1 refills | Status: DC

## 2020-04-25 MED ORDER — SYNTHROID 50 MCG PO TABS: 50.0000 ug | ORAL_TABLET | Freq: Every day | ORAL | 1 refills | Status: DC

## 2020-04-25 MED ORDER — COZAAR 50 MG PO TABS: 50.0000 mg | ORAL_TABLET | Freq: Every day | ORAL | 1 refills | Status: DC

## 2020-04-25 NOTE — Telephone Encounter (Signed)
Rx sent 

## 2020-04-25 NOTE — Telephone Encounter (Signed)
Medication: furosemide (LASIX) 40 MG tablet [[id number]]   COZAAR 50 MG tablet  propranolol ER (INDERAL LA) 60 MG 24 hr capsule   SYNTHROID 50 MCG tablet   Has the patient contacted their pharmacy? No. (If no, request that the patient contact the pharmacy for the refill.) (If yes, when and what did the pharmacy advise?)  Preferred Pharmacy (with phone number or street name):  [facility], [place] Phone:  [phone]  Fax:  [phone]       Agent: Please be advised that RX refills may take up to 3 business days. We ask that you follow-up with your pharmacy.

## 2020-04-29 ENCOUNTER — Telehealth: Payer: Self-pay

## 2020-04-29 NOTE — Telephone Encounter (Signed)
Patient pharmacy called in to get a prescription refill for propranolol ER (INDERAL LA) 60 MG 24 hr capsule [[id number]]    furosemide (LASIX) 40 MG tablet [[id number]]    Please end it to [facility], [place]  [address], [place] [address]  Phone:  [phone] Fax:  [phone]  DEA #:  --

## 2020-05-02 MED ORDER — FUROSEMIDE 40 MG PO TABS: 40.0000 mg | ORAL_TABLET | Freq: Every day | ORAL | 1 refills | Status: DC

## 2020-05-02 MED ORDER — PROPRANOLOL HCL ER 60 MG PO CP24: 60.0000 mg | ORAL_CAPSULE | Freq: Every day | ORAL | 1 refills | Status: DC

## 2020-05-02 NOTE — Addendum Note (Signed)
Addended by: Kem Boroughs D on: 05/02/2020 11:28 AM   Modules accepted: Orders

## 2020-05-20 ENCOUNTER — Telehealth: Payer: Self-pay | Admitting: Family Medicine

## 2020-05-20 NOTE — Telephone Encounter (Signed)
Patient states pharmacy have not received prescription  propranolol ER (INDERAL LA) 60 MG 24 hr capsule [[id number]]    furosemide (LASIX) 40 MG tablet [[id number]]  SYNTHROID 50 MCG tablet [[id number]]   [facility], [place] - [address]  [address], [place] [address]  Phone:  [phone] Fax:  [phone]

## 2020-05-23 ENCOUNTER — Other Ambulatory Visit: Payer: Self-pay | Admitting: *Deleted

## 2020-05-23 MED ORDER — PROPRANOLOL HCL ER 60 MG PO CP24: 60.0000 mg | ORAL_CAPSULE | Freq: Every day | ORAL | 1 refills | Status: DC

## 2020-05-23 MED ORDER — SYNTHROID 50 MCG PO TABS: 50.0000 ug | ORAL_TABLET | Freq: Every day | ORAL | 1 refills | Status: DC

## 2020-05-23 MED ORDER — FUROSEMIDE 40 MG PO TABS: 40.0000 mg | ORAL_TABLET | Freq: Every day | ORAL | 1 refills | Status: DC

## 2020-05-23 MED ORDER — LOSARTAN POTASSIUM 50 MG PO TABS
50.0000 mg | ORAL_TABLET | Freq: Every day | ORAL | 0 refills | Status: DC
Start: 2020-05-23 — End: 2020-05-23

## 2020-05-23 MED ORDER — LOSARTAN POTASSIUM 50 MG PO TABS: 50.0000 mg | ORAL_TABLET | Freq: Every day | ORAL | 1 refills | Status: DC

## 2020-05-23 MED ORDER — COZAAR 50 MG PO TABS: 50.0000 mg | ORAL_TABLET | Freq: Every day | ORAL | 1 refills | Status: DC

## 2020-05-23 NOTE — Telephone Encounter (Signed)
[facility] called and stated that patient requested generic Cozaar and she only had a week supply left and also needed some sent to her local pharmacy.    [facility] and rx sent to local pharmacy until she gets mail order in.    Tried calling patient but she was on the phone with [facility] and she stated she would call back.

## 2020-05-23 NOTE — Telephone Encounter (Signed)
Left message on machine that rxs was sent in.

## 2020-05-24 ENCOUNTER — Telehealth: Payer: Self-pay | Admitting: *Deleted

## 2020-05-24 NOTE — Telephone Encounter (Signed)
[facility] sent over fax asking if patient was on brand or generic of propranolol and furosemide.    Patient stated since the insurance is giving hard time and does not want to pay, she will just take the generic at bedtime to not have any symptoms.  She will be seeing a new cardiologist soon and will get him to fix this.  Papers [facility]ed [facility] to [facility] to change to generic

## 2020-06-02 ENCOUNTER — Telehealth: Payer: Self-pay | Admitting: *Deleted

## 2020-06-02 ENCOUNTER — Other Ambulatory Visit: Payer: Self-pay | Admitting: Family Medicine

## 2020-06-02 MED ORDER — ALPRAZOLAM 0.5 MG PO TABS: 0.5000 mg | ORAL_TABLET | Freq: Every evening | ORAL | 1 refills | Status: DC | PRN

## 2020-06-02 NOTE — Telephone Encounter (Signed)
done

## 2020-06-02 NOTE — Telephone Encounter (Signed)
[facility] sent a refill request for alprazolam  Last written: 10/27/19 Last ov: 11/16/19 Next ov: none Contract: 03/17/18 UDS: 03/17/18

## 2020-06-06 ENCOUNTER — Telehealth: Payer: Self-pay | Admitting: Family Medicine

## 2020-06-06 ENCOUNTER — Other Ambulatory Visit: Payer: Self-pay | Admitting: Family Medicine

## 2020-06-06 DIAGNOSIS — H8110 Benign paroxysmal vertigo, unspecified ear: Secondary | ICD-10-CM

## 2020-06-06 NOTE — Telephone Encounter (Signed)
done

## 2020-06-06 NOTE — Telephone Encounter (Signed)
Caller: Errica Call back phone # [phone]  Patient would like a referral to [place] with [facility] for vertigo. [facility] PT phone number is [phone]

## 2020-06-07 NOTE — Progress Notes (Signed)
Cardiology Office Note:   Date:  06/08/2020  NAME:  Christina Mckinney    MRN: [id number] DOB:  13-Feb-1932   PCP:  Mosie Lukes, MD  Cardiologist:  No primary care provider on file.   Referring MD: Mosie Lukes, MD   Chief Complaint  Patient presents with  . Palpitations   History of Present Illness:   Christina Mckinney is a 86 y.o. female with a hx of carotid artery stenosis (1-39%), HLD (statin intolerant/refuses other meds), HTN who presents for follow-up of palpitations. Monitor normal.  She reports she is doing fairly well since her last visit.  She reports she is constantly dizzy.  Her blood pressure is 106/50 today.  She is on Cozaar 50 mg daily and propranolol 60 mg daily.  We do need to stop her Cozaar.  She reports she still gets intermittent episodes of her heart racing but it is reassuring to know that her heart monitor was normal.  We also went over the results of her carotid ultrasounds which showed minimal plaque.  There is no change since 2018.  I think she can have these checked every 2 to 3 years.  She overall appears stressed and concerned about several issues.  It appears that her heart health is doing quite well.  She denies chest pain, shortness of breath or palpitations today.   Problem List 1. Carotid artery stenosis -bilateral 1-39% 04/15/2020 2. HTN 3. HLD -T chol 217, HDL 51, LDL 136, TG 148 -A1c 5.8 -statin intolerant/refuses zetia   Past Medical History: Past Medical History:  Diagnosis Date  . Anemia, unspecified   . Anxiety and depression 09/18/2015  . Anxiety state, unspecified   . Chest pain   . Chronic systolic CHF (congestive heart failure) ([place]) 08/12/2015  . Depression with anxiety 06/02/2007   Qualifier: Diagnosis of  By: Wynona Luna   . Depressive disorder, not elsewhere classified   . Diverticulitis   . DVT (deep venous thrombosis) ([place])   . Dyspnea 07/22/2015  . Edema 06/12/2014  . Essential and other specified forms of tremor   . GERD  (gastroesophageal reflux disease)   . Gout, unspecified   . Internal hemorrhoids without mention of complication   . Macular degeneration 08/07/2015  . Osteoarthritis   . Other abnormal glucose   . Other and unspecified hyperlipidemia   . Personal history of colonic polyps   . Personal history of peptic ulcer disease   . Pulmonary embolism ([place])   . Unspecified disorder of bladder   . Unspecified essential hypertension   . Unspecified hypothyroidism   . Varicose veins     Past Surgical History: Past Surgical History:  Procedure Laterality Date  . ABDOMINAL HYSTERECTOMY    . CHOLECYSTECTOMY    . ENDOVENOUS ABLATION SAPHENOUS VEIN W/ LASER  09-11-2012   left greater saphenous vein   by Curt Jews MD  . EYE SURGERY    . KNEE ARTHROSCOPY     right   . NASAL SEPTUM SURGERY    . SHOULDER SURGERY    . TOTAL KNEE ARTHROPLASTY  09/2013   Right    Current Medications: Current Meds  Medication Sig  . albuterol (PROAIR HFA) 108 (90 Base) MCG/ACT inhaler USE 2 INHALATIONS EVERY 6 HOURS AS NEEDED FOR WHEEZING OR SHORTNESS OF BREATH  . ALPRAZolam (XANAX) 0.5 MG tablet Take 1 tablet (0.5 mg total) by mouth at bedtime as needed for anxiety.  Marland Kitchen aspirin 81 MG tablet Take 1  tablet (81 mg total) by mouth daily.  Marland Kitchen co-enzyme Q-10 30 MG capsule Take 30 mg by mouth daily.   . febuxostat (ULORIC) 40 MG tablet Take 1 tablet (40 mg total) by mouth daily.  . fexofenadine (ALLEGRA ALLERGY) 180 MG tablet Take 1 tablet (180 mg total) by mouth daily.  . fluticasone (FLONASE) 50 MCG/ACT nasal spray Place 2 sprays into both nostrils daily. USE 2 SPRAYS IN EACH NOSTRIL DAILY AS NEEDED FOR ALLERGIES OR RHINITIS  . fluticasone (FLOVENT HFA) 110 MCG/ACT inhaler Inhale 2 puffs into the lungs 2 (two) times daily.  . furosemide (LASIX) 40 MG tablet Take 1 tablet (40 mg total) by mouth daily.  Marland Kitchen gabapentin (NEURONTIN) 300 MG capsule Take 1 capsule (300 mg total) by mouth at bedtime.  Marland Kitchen guaiFENesin (MUCINEX) 600 MG  12 hr tablet Take 1,200 mg by mouth 2 (two) times daily as needed for to loosen phlegm.   Marland Kitchen ipratropium-albuterol (DUONEB) 0.5-2.5 (3) MG/3ML SOLN USE THREE TIMES A DAY FOR NEXT 3 DAYS AND THEN AS NEEDED FOR WHEEZING.  Marland Kitchen propranolol ER (INDERAL LA) 60 MG 24 hr capsule Take 1 capsule (60 mg total) by mouth daily.  Marland Kitchen PROTONIX 40 MG tablet Take 1 tablet (40 mg total) by mouth 2 (two) times daily before a meal.  . sucralfate (CARAFATE) 1 g tablet Take 1 tablet (1 g total) by mouth in the morning, at noon, in the evening, and at bedtime.  Marland Kitchen SYNTHROID 50 MCG tablet Take 1 tablet (50 mcg total) by mouth daily before breakfast.  . [DISCONTINUED] losartan (COZAAR) 50 MG tablet Take 1 tablet (50 mg total) by mouth daily.     Allergies:    Simvastatin, Crestor [rosuvastatin], Lipitor [atorvastatin], Red dye, and Statins   Social History: Social History   Socioeconomic History  . Marital status: Widowed    Spouse name: Not on file  . Number of children: 3  . Years of education: Not on file  . Highest education level: Not on file  Occupational History  . Occupation: Retired   Tobacco Use  . Smoking status: Former Smoker    Years: 40.00    Types: Cigarettes    Quit date: 11/12/1988    Years since quitting: 31.5  . Smokeless tobacco: Never Used  Substance and Sexual Activity  . Alcohol use: Yes    Alcohol/week: 4.0 standard drinks    Types: 4 Standard drinks or equivalent per week    Comment: occasional wine   . Drug use: No  . Sexual activity: Never    Comment: lives alone, no dietary restrictions, widowed  Other Topics Concern  . Not on file  Social History Narrative  . Not on file   Social Determinants of Health   Financial Resource Strain:   . Difficulty of Paying Living Expenses:   Food Insecurity:   . Worried About Charity fundraiser in the Last Year:   . Arboriculturist in the Last Year:   Transportation Needs:   . Film/video editor (Medical):   Marland Kitchen Lack of  Transportation (Non-Medical):   Physical Activity:   . Days of Exercise per Week:   . Minutes of Exercise per Session:   Stress:   . Feeling of Stress :   Social Connections:   . Frequency of Communication with Friends and Family:   . Frequency of Social Gatherings with Friends and Family:   . Attends Religious Services:   . Active Member of Clubs or Organizations:   .  Attends Archivist Meetings:   Marland Kitchen Marital Status:      Family History: The patient's family history includes Aneurysm in her daughter; Cancer in her sister; Diabetes in her daughter; Heart attack in her father; Heart disease in her brother, daughter, father, and sister; Hip fracture in her mother; Hyperlipidemia in her daughter and father; Hypertension in her daughter; Other in her mother. There is no history of Colon cancer.  ROS:   All other ROS reviewed and negative. Pertinent positives noted in the HPI.     EKGs/Labs/Other Studies Reviewed:   The following studies were personally reviewed by me today:  [facility] 05/05/2020 Impression:  1. No significant arrhythmias detected.  2. Rare ectopy.  3. Symptoms coincided with normal sinus rhythm.   Carotid US 04/15/2020 -Bilateral 1-39%  Recent Labs: 11/16/2019: ALT 7; BUN 28; Creatinine, Ser 1.37; Hemoglobin 12.8; Platelets 209.0; Potassium 4.4; Sodium 142; TSH 4.73   Recent Lipid Panel    Component Value Date/Time   CHOL 217 (H) 11/16/2019 1042   TRIG 148.0 11/16/2019 1042   HDL 51.60 11/16/2019 1042   CHOLHDL 4 11/16/2019 1042   VLDL 29.6 11/16/2019 1042   LDLCALC 136 (H) 11/16/2019 1042   LDLDIRECT 83.0 05/21/2019 1511    Physical Exam:   VS:  BP (!) 110/52   Pulse 74   Ht 5\' 1"  (1.549 m)   Wt 148 lb (67.1 kg)   SpO2 97%   BMI 27.96 kg/m    Wt Readings from Last 3 Encounters:  06/08/20 148 lb (67.1 kg)  04/05/20 149 lb (67.6 kg)  11/16/19 157 lb (71.2 kg)    General: Well nourished, well developed, in no acute distress Heart: Atraumatic,  normal size  Eyes: PEERLA, EOMI  Neck: Supple, no JVD Endocrine: No thryomegaly Cardiac: Normal S1, S2; RRR; no murmurs, rubs, or gallops Lungs: Clear to auscultation bilaterally, no wheezing, rhonchi or rales  Abd: Soft, nontender, no hepatomegaly  Ext: No edema, pulses 2+ Musculoskeletal: No deformities, BUE and BLE strength normal and equal Skin: Warm and dry, no rashes   Neuro: Alert and oriented to person, place, time, and situation, CNII-XII grossly intact, no focal deficits  Psych: Normal mood and affect   ASSESSMENT:   Christina Mckinney is a 86 y.o. female who presents for the following: 1. Bilateral carotid artery stenosis   2. Palpitations   3. Essential hypertension   4. Mixed hyperlipidemia     PLAN:   1. Bilateral carotid artery stenosis -1-39% bilaterally. Stable. Will continue to monitor.   2. Palpitations -normal monitor.   3. Essential hypertension -Dizzy all the time. BP a bit low. Will stop losartan 25 mg QD.   4. Mixed hyperlipidemia -refuses statin. On CoQ10. Not interested in zetia.   Disposition: Return in about 1 year (around 06/08/2021).  Medication Adjustments/Labs and Tests Ordered: Current medicines are reviewed at length with the patient today.  Concerns regarding medicines are outlined above.  No orders of the defined types were placed in this encounter.  No orders of the defined types were placed in this encounter.   Patient Instructions  Medication Instructions:  Stop Cozaar   *If you need a refill on your cardiac medications before your next appointment, please call your pharmacy*   Follow-Up: At [facility], you and your health needs are our priority.  As part of our continuing mission to provide you with exceptional heart care, we have created designated Provider Care Teams.  These Care Teams include  your primary Cardiologist (physician) and Advanced Practice Providers (APPs -  Physician Assistants and Nurse Practitioners) who all  work together to provide you with the care you need, when you need it.  We recommend signing up for the patient portal called "MyChart".  Sign up information is provided on this After Visit Summary.  MyChart is used to connect with patients for Virtual Visits (Telemedicine).  Patients are able to view lab/test results, encounter notes, upcoming appointments, etc.  Non-urgent messages can be sent to your provider as well.   To learn more about what you can do with MyChart, go to [email or web address].    Your next appointment:   12 month(s)  The format for your next appointment:   In Person  Provider:   Eleonore Chiquito, MD        Time Spent with Patient: I have spent a total of 25 minutes with patient reviewing hospital notes, telemetry, EKGs, labs and examining the patient as well as establishing an assessment and plan that was discussed with the patient.  > 50% of time was spent in direct patient care.  Signed, Addison Naegeli. Audie Box, [place]  [address] [place], [place] [address] [phone]  06/08/2020 3:40 PM

## 2020-06-07 NOTE — Telephone Encounter (Signed)
Spoke with patient, advised referral was placed.

## 2020-06-08 ENCOUNTER — Other Ambulatory Visit: Payer: Self-pay

## 2020-06-08 ENCOUNTER — Encounter: Payer: Self-pay | Admitting: Cardiovascular Disease

## 2020-06-08 ENCOUNTER — Ambulatory Visit (INDEPENDENT_AMBULATORY_CARE_PROVIDER_SITE_OTHER): Payer: Medicare Other | Admitting: Cardiovascular Disease

## 2020-06-08 VITALS — BP 110/52 | HR 74 | Ht 61.0 in | Wt 148.0 lb

## 2020-06-08 DIAGNOSIS — R002 Palpitations: Secondary | ICD-10-CM | POA: Diagnosis not present

## 2020-06-08 DIAGNOSIS — E782 Mixed hyperlipidemia: Secondary | ICD-10-CM

## 2020-06-08 DIAGNOSIS — I6523 Occlusion and stenosis of bilateral carotid arteries: Secondary | ICD-10-CM

## 2020-06-08 DIAGNOSIS — I10 Essential (primary) hypertension: Secondary | ICD-10-CM

## 2020-06-08 NOTE — Patient Instructions (Signed)
Medication Instructions:  Stop Cozaar   *If you need a refill on your cardiac medications before your next appointment, please call your pharmacy*   Follow-Up: At [facility], you and your health needs are our priority.  As part of our continuing mission to provide you with exceptional heart care, we have created designated Provider Care Teams.  These Care Teams include your primary Cardiologist (physician) and Advanced Practice Providers (APPs -  Physician Assistants and Nurse Practitioners) who all work together to provide you with the care you need, when you need it.  We recommend signing up for the patient portal called "MyChart".  Sign up information is provided on this After Visit Summary.  MyChart is used to connect with patients for Virtual Visits (Telemedicine).  Patients are able to view lab/test results, encounter notes, upcoming appointments, etc.  Non-urgent messages can be sent to your provider as well.   To learn more about what you can do with MyChart, go to [email or web address].    Your next appointment:   12 month(s)  The format for your next appointment:   In Person  Provider:   Eleonore Chiquito, MD

## 2020-06-11 ENCOUNTER — Other Ambulatory Visit: Payer: Self-pay | Admitting: Family Medicine

## 2020-06-13 ENCOUNTER — Other Ambulatory Visit: Payer: Self-pay

## 2020-06-13 MED ORDER — FLOVENT HFA 110 MCG/ACT IN AERO: 2.0000 | INHALATION_SPRAY | Freq: Two times a day (BID) | RESPIRATORY_TRACT | 1 refills | Status: DC

## 2020-06-14 ENCOUNTER — Telehealth: Payer: Self-pay | Admitting: Family Medicine

## 2020-06-14 MED ORDER — FUROSEMIDE 40 MG PO TABS: 40.0000 mg | ORAL_TABLET | Freq: Every day | ORAL | 1 refills | Status: DC

## 2020-06-14 NOTE — Telephone Encounter (Signed)
Patient notified that rx has been sent in. 

## 2020-06-14 NOTE — Telephone Encounter (Signed)
Medication:pantoprazole 40 mg furosemide (LASIX) 40 MG tablet [[id number]]    Has the patient contacted their pharmacy? No. (If no, request that the patient contact the pharmacy for the refill.) (If yes, when and what did the pharmacy advise?)  Preferred Pharmacy (with phone number or street name): [facility]  [address] [place] [address]  Phone:  [phone] Fax:  [phone]  DEA #:  [phone]  Agent: Please be advised that RX refills may take up to 3 business days. We ask that you follow-up with your pharmacy.

## 2020-06-15 DIAGNOSIS — R42 Dizziness and giddiness: Secondary | ICD-10-CM | POA: Diagnosis not present

## 2020-06-15 DIAGNOSIS — I951 Orthostatic hypotension: Secondary | ICD-10-CM | POA: Diagnosis not present

## 2020-06-15 DIAGNOSIS — H8113 Benign paroxysmal vertigo, bilateral: Secondary | ICD-10-CM | POA: Diagnosis not present

## 2020-06-22 DIAGNOSIS — H8113 Benign paroxysmal vertigo, bilateral: Secondary | ICD-10-CM | POA: Diagnosis not present

## 2020-06-22 DIAGNOSIS — R42 Dizziness and giddiness: Secondary | ICD-10-CM | POA: Diagnosis not present

## 2020-06-22 DIAGNOSIS — I951 Orthostatic hypotension: Secondary | ICD-10-CM | POA: Diagnosis not present

## 2020-06-29 DIAGNOSIS — I951 Orthostatic hypotension: Secondary | ICD-10-CM | POA: Diagnosis not present

## 2020-06-29 DIAGNOSIS — H8113 Benign paroxysmal vertigo, bilateral: Secondary | ICD-10-CM | POA: Diagnosis not present

## 2020-06-29 DIAGNOSIS — R42 Dizziness and giddiness: Secondary | ICD-10-CM | POA: Diagnosis not present

## 2020-07-04 ENCOUNTER — Telehealth: Payer: Self-pay | Admitting: Family Medicine

## 2020-07-04 MED ORDER — PROTONIX 40 MG PO TBEC: 40.0000 mg | DELAYED_RELEASE_TABLET | Freq: Two times a day (BID) | ORAL | 1 refills | Status: DC

## 2020-07-04 MED ORDER — PROPRANOLOL HCL ER 60 MG PO CP24: 60.0000 mg | ORAL_CAPSULE | Freq: Every day | ORAL | 1 refills | Status: DC

## 2020-07-04 NOTE — Telephone Encounter (Signed)
Patient states she is out of medication. 71 pills into  [facility], [place] Phone:  [phone]  Fax:  [phone]       and then 3 months supply to   [facility], [place] Phone:  [phone]  Fax:  [phone]       propranolol ER (INDERAL LA) 60 MG 24 hr capsule [[id number]]    PROTONIX 40 MG tablet [[id number]]

## 2020-07-04 NOTE — Telephone Encounter (Signed)
Patient notified that rxs have been sent in. 

## 2020-07-05 DIAGNOSIS — I951 Orthostatic hypotension: Secondary | ICD-10-CM | POA: Diagnosis not present

## 2020-07-05 DIAGNOSIS — R42 Dizziness and giddiness: Secondary | ICD-10-CM | POA: Diagnosis not present

## 2020-07-05 DIAGNOSIS — H8113 Benign paroxysmal vertigo, bilateral: Secondary | ICD-10-CM | POA: Diagnosis not present

## 2020-07-05 MED ORDER — PANTOPRAZOLE SODIUM 40 MG PO TBEC: 40.0000 mg | DELAYED_RELEASE_TABLET | Freq: Two times a day (BID) | ORAL | 1 refills | Status: DC

## 2020-07-05 NOTE — Addendum Note (Signed)
Addended by: Kem Boroughs D on: 07/05/2020 10:33 AM   Modules accepted: Orders

## 2020-07-05 NOTE — Telephone Encounter (Signed)
Pharmacy faxed over stating that brand is not covered.  Patient is willing to try generic.  Generic was sent in.

## 2020-07-07 ENCOUNTER — Other Ambulatory Visit: Payer: Self-pay | Admitting: *Deleted

## 2020-07-07 MED ORDER — PANTOPRAZOLE SODIUM 40 MG PO TBEC: 40.0000 mg | DELAYED_RELEASE_TABLET | Freq: Two times a day (BID) | ORAL | 1 refills | Status: DC

## 2020-07-12 DIAGNOSIS — J324 Chronic pansinusitis: Secondary | ICD-10-CM | POA: Diagnosis not present

## 2020-07-12 DIAGNOSIS — K219 Gastro-esophageal reflux disease without esophagitis: Secondary | ICD-10-CM | POA: Diagnosis not present

## 2020-07-13 DIAGNOSIS — R42 Dizziness and giddiness: Secondary | ICD-10-CM | POA: Diagnosis not present

## 2020-07-13 DIAGNOSIS — I951 Orthostatic hypotension: Secondary | ICD-10-CM | POA: Diagnosis not present

## 2020-07-13 DIAGNOSIS — H8113 Benign paroxysmal vertigo, bilateral: Secondary | ICD-10-CM | POA: Diagnosis not present

## 2020-07-20 DIAGNOSIS — H8113 Benign paroxysmal vertigo, bilateral: Secondary | ICD-10-CM | POA: Diagnosis not present

## 2020-07-20 DIAGNOSIS — R42 Dizziness and giddiness: Secondary | ICD-10-CM | POA: Diagnosis not present

## 2020-07-20 DIAGNOSIS — I951 Orthostatic hypotension: Secondary | ICD-10-CM | POA: Diagnosis not present

## 2020-07-27 DIAGNOSIS — H8113 Benign paroxysmal vertigo, bilateral: Secondary | ICD-10-CM | POA: Diagnosis not present

## 2020-07-27 DIAGNOSIS — R42 Dizziness and giddiness: Secondary | ICD-10-CM | POA: Diagnosis not present

## 2020-07-27 DIAGNOSIS — I951 Orthostatic hypotension: Secondary | ICD-10-CM | POA: Diagnosis not present

## 2020-08-11 DIAGNOSIS — I951 Orthostatic hypotension: Secondary | ICD-10-CM | POA: Diagnosis not present

## 2020-08-11 DIAGNOSIS — H8113 Benign paroxysmal vertigo, bilateral: Secondary | ICD-10-CM | POA: Diagnosis not present

## 2020-08-11 DIAGNOSIS — R42 Dizziness and giddiness: Secondary | ICD-10-CM | POA: Diagnosis not present

## 2020-08-18 ENCOUNTER — Other Ambulatory Visit: Payer: Self-pay | Admitting: Family Medicine

## 2020-08-23 DIAGNOSIS — R490 Dysphonia: Secondary | ICD-10-CM | POA: Diagnosis not present

## 2020-08-23 DIAGNOSIS — J310 Chronic rhinitis: Secondary | ICD-10-CM | POA: Diagnosis not present

## 2020-08-23 DIAGNOSIS — R198 Other specified symptoms and signs involving the digestive system and abdomen: Secondary | ICD-10-CM | POA: Diagnosis not present

## 2020-08-23 DIAGNOSIS — J324 Chronic pansinusitis: Secondary | ICD-10-CM | POA: Diagnosis not present

## 2020-08-23 DIAGNOSIS — J321 Chronic frontal sinusitis: Secondary | ICD-10-CM | POA: Diagnosis not present

## 2020-08-29 DIAGNOSIS — H04123 Dry eye syndrome of bilateral lacrimal glands: Secondary | ICD-10-CM | POA: Diagnosis not present

## 2020-08-29 DIAGNOSIS — Z961 Presence of intraocular lens: Secondary | ICD-10-CM | POA: Diagnosis not present

## 2020-08-31 DIAGNOSIS — R42 Dizziness and giddiness: Secondary | ICD-10-CM | POA: Diagnosis not present

## 2020-08-31 DIAGNOSIS — I951 Orthostatic hypotension: Secondary | ICD-10-CM | POA: Diagnosis not present

## 2020-08-31 DIAGNOSIS — H8113 Benign paroxysmal vertigo, bilateral: Secondary | ICD-10-CM | POA: Diagnosis not present

## 2020-09-02 ENCOUNTER — Other Ambulatory Visit: Payer: Self-pay | Admitting: *Deleted

## 2020-09-02 MED ORDER — PANTOPRAZOLE SODIUM 40 MG PO TBEC: 40.0000 mg | DELAYED_RELEASE_TABLET | Freq: Two times a day (BID) | ORAL | 1 refills | Status: DC

## 2020-09-08 DIAGNOSIS — R42 Dizziness and giddiness: Secondary | ICD-10-CM | POA: Diagnosis not present

## 2020-09-08 DIAGNOSIS — H8113 Benign paroxysmal vertigo, bilateral: Secondary | ICD-10-CM | POA: Diagnosis not present

## 2020-09-08 DIAGNOSIS — I951 Orthostatic hypotension: Secondary | ICD-10-CM | POA: Diagnosis not present

## 2020-09-13 ENCOUNTER — Encounter: Payer: Self-pay | Admitting: Family Medicine

## 2020-09-13 ENCOUNTER — Ambulatory Visit (INDEPENDENT_AMBULATORY_CARE_PROVIDER_SITE_OTHER): Payer: Medicare Other | Admitting: Family Medicine

## 2020-09-13 ENCOUNTER — Other Ambulatory Visit: Payer: Self-pay

## 2020-09-13 ENCOUNTER — Telehealth: Payer: Self-pay | Admitting: Family Medicine

## 2020-09-13 VITALS — BP 112/74 | HR 73 | Temp 97.8°F | Resp 16 | Wt 150.8 lb

## 2020-09-13 DIAGNOSIS — N189 Chronic kidney disease, unspecified: Secondary | ICD-10-CM | POA: Diagnosis not present

## 2020-09-13 DIAGNOSIS — E039 Hypothyroidism, unspecified: Secondary | ICD-10-CM

## 2020-09-13 DIAGNOSIS — I10 Essential (primary) hypertension: Secondary | ICD-10-CM | POA: Diagnosis not present

## 2020-09-13 DIAGNOSIS — R002 Palpitations: Secondary | ICD-10-CM

## 2020-09-13 DIAGNOSIS — E119 Type 2 diabetes mellitus without complications: Secondary | ICD-10-CM

## 2020-09-13 DIAGNOSIS — M109 Gout, unspecified: Secondary | ICD-10-CM

## 2020-09-13 DIAGNOSIS — I6523 Occlusion and stenosis of bilateral carotid arteries: Secondary | ICD-10-CM

## 2020-09-13 DIAGNOSIS — D649 Anemia, unspecified: Secondary | ICD-10-CM | POA: Diagnosis not present

## 2020-09-13 DIAGNOSIS — E782 Mixed hyperlipidemia: Secondary | ICD-10-CM

## 2020-09-13 DIAGNOSIS — E673 Hypervitaminosis D: Secondary | ICD-10-CM

## 2020-09-13 DIAGNOSIS — G4730 Sleep apnea, unspecified: Secondary | ICD-10-CM | POA: Diagnosis not present

## 2020-09-13 DIAGNOSIS — N289 Disorder of kidney and ureter, unspecified: Secondary | ICD-10-CM

## 2020-09-13 NOTE — Telephone Encounter (Signed)
Pt dropped off copy of Covid Vaccination Card for provider to see and have on pt's chart. Document put at front office tray under providers name.

## 2020-09-13 NOTE — Patient Instructions (Addendum)
Check your blood pressure at home twice a week and pulse Omron upper arm blood pressure cuff, send me numbers  Let us know which cardiologist you want to see  Cartia mobile can capture irregular heart rhythms at home. Check their website    Hypertension, Adult Hypertension is another name for high blood pressure. High blood pressure forces your heart to work harder to pump blood. This can cause problems over time. There are two numbers in a blood pressure reading. There is a top number (systolic) over a bottom number (diastolic). It is best to have a blood pressure that is below 120/80. Healthy choices can help lower your blood pressure, or you may need medicine to help lower it. What are the causes? The cause of this condition is not known. Some conditions may be related to high blood pressure. What increases the risk?  Smoking.  Having type 2 diabetes mellitus, high cholesterol, or both.  Not getting enough exercise or physical activity.  Being overweight.  Having too much fat, sugar, calories, or salt (sodium) in your diet.  Drinking too much alcohol.  Having long-term (chronic) kidney disease.  Having a family history of high blood pressure.  Age. Risk increases with age.  Race. You may be at higher risk if you are African American.  Gender. Men are at higher risk than women before age 50. After age 91, women are at higher risk than men.  Having obstructive sleep apnea.  Stress. What are the signs or symptoms?  High blood pressure may not cause symptoms. Very high blood pressure (hypertensive crisis) may cause: ? Headache. ? Feelings of worry or nervousness (anxiety). ? Shortness of breath. ? Nosebleed. ? A feeling of being sick to your stomach (nausea). ? Throwing up (vomiting). ? Changes in how you see. ? Very bad chest pain. ? Seizures. How is this treated?  This condition is treated by making healthy lifestyle changes, such as: ? Eating healthy  foods. ? Exercising more. ? Drinking less alcohol.  Your health care provider may prescribe medicine if lifestyle changes are not enough to get your blood pressure under control, and if: ? Your top number is above 130. ? Your bottom number is above 80.  Your personal target blood pressure may vary. Follow these instructions at home: Eating and drinking   If told, follow the DASH eating plan. To follow this plan: ? Fill one half of your plate at each meal with fruits and vegetables. ? Fill one fourth of your plate at each meal with whole grains. Whole grains include whole-wheat pasta, brown rice, and whole-grain bread. ? Eat or drink low-fat dairy products, such as skim milk or low-fat yogurt. ? Fill one fourth of your plate at each meal with low-fat (lean) proteins. Low-fat proteins include fish, chicken without skin, eggs, beans, and tofu. ? Avoid fatty meat, cured and processed meat, or chicken with skin. ? Avoid pre-made or processed food.  Eat less than 1,500 mg of salt each day.  Do not drink alcohol if: ? Your doctor tells you not to drink. ? You are pregnant, may be pregnant, or are planning to become pregnant.  If you drink alcohol: ? Limit how much you use to:  0-1 drink a day for women.  0-2 drinks a day for men. ? Be aware of how much alcohol is in your drink. In the [place] one drink equals one 12 oz bottle of beer (355 mL), one 5 oz glass of wine (148 mL),  or one 1 oz glass of hard liquor (44 mL). Lifestyle   Work with your doctor to stay at a healthy weight or to lose weight. Ask your doctor what the best weight is for you.  Get at least 30 minutes of exercise most days of the week. This may include walking, swimming, or biking.  Get at least 30 minutes of exercise that strengthens your muscles (resistance exercise) at least 3 days a week. This may include lifting weights or doing Pilates.  Do not use any products that contain nicotine or tobacco, such as  cigarettes, e-cigarettes, and chewing tobacco. If you need help quitting, ask your doctor.  Check your blood pressure at home as told by your doctor.  Keep all follow-up visits as told by your doctor. This is important. Medicines  Take over-the-counter and prescription medicines only as told by your doctor. Follow directions carefully.  Do not skip doses of blood pressure medicine. The medicine does not work as well if you skip doses. Skipping doses also puts you at risk for problems.  Ask your doctor about side effects or reactions to medicines that you should watch for. Contact a doctor if you:  Think you are having a reaction to the medicine you are taking.  Have headaches that keep coming back (recurring).  Feel dizzy.  Have swelling in your ankles.  Have trouble with your vision. Get help right away if you:  Get a very bad headache.  Start to feel mixed up (confused).  Feel weak or numb.  Feel faint.  Have very bad pain in your: ? Chest. ? Belly (abdomen).  Throw up more than once.  Have trouble breathing. Summary  Hypertension is another name for high blood pressure.  High blood pressure forces your heart to work harder to pump blood.  For most people, a normal blood pressure is less than 120/80.  Making healthy choices can help lower blood pressure. If your blood pressure does not get lower with healthy choices, you may need to take medicine. This information is not intended to replace advice given to you by your health care provider. Make sure you discuss any questions you have with your health care provider. Document Revised: 07/09/2018 Document Reviewed: 07/09/2018 Elsevier Patient Education  2020 [employer].

## 2020-09-14 DIAGNOSIS — R002 Palpitations: Secondary | ICD-10-CM | POA: Insufficient documentation

## 2020-09-14 NOTE — Progress Notes (Signed)
Pt is aware of lab results and scheduled for 10/12/2020 for cmp recheck.

## 2020-09-14 NOTE — Assessment & Plan Note (Signed)
Encouraged heart healthy diet, increase exercise, avoid trans fats, consider a krill oil cap daily 

## 2020-09-14 NOTE — Assessment & Plan Note (Signed)
On Synthroid DAW, continue to monitor. She does not tolerate the generic Levothyroxine.

## 2020-09-14 NOTE — Assessment & Plan Note (Signed)
She has continued to have episodes that sometimes have associated SOB and diaphoresis. She has undergone an event monitor but no concerns identified per patient. She is asked to get a Cartia mobile and record her episodes for review. She is interested in a referral to a new cardiologist she will let us know when she talks with family and decides whom she wants to see.

## 2020-09-14 NOTE — Assessment & Plan Note (Signed)
No recent exacerbation 

## 2020-09-14 NOTE — Assessment & Plan Note (Signed)
Well controlled, no changes to meds. Encouraged heart healthy diet such as the DASH diet and exercise as tolerated.  °

## 2020-09-14 NOTE — Assessment & Plan Note (Signed)
Hydrate and monitor 

## 2020-09-14 NOTE — Assessment & Plan Note (Signed)
She is not currently using her CPAP but is willing to consider restarting. She is referred to pulmonology for further evaluation

## 2020-09-14 NOTE — Progress Notes (Signed)
Subjective:    Patient ID: Christina Mckinney, female    DOB: 19-Nov-1931, 86 y.o.   MRN: [id number]  Chief Complaint  Patient presents with  . Follow-up    HPI Patient is in today for follow up on chronic medical concerns. No recent febrile illness or hospitalizations. She has been struggling with intermittent palpitations and fatigue. She is not using her CPAP and thus not sleeping well. She notes some SOB with the palpitations at times. Her blood pressure was running low so her cardiologist adjusted her meds and her blood pressure have been better. Denies CP/HA/congestion/fevers/GI or GU c/o. Taking meds as prescribed  Past Medical History:  Diagnosis Date  . Anemia, unspecified   . Anxiety and depression 09/18/2015  . Anxiety state, unspecified   . Chest pain   . Chronic systolic CHF (congestive heart failure) ([place]) 08/12/2015  . Depression with anxiety 06/02/2007   Qualifier: Diagnosis of  By: Wynona Luna   . Depressive disorder, not elsewhere classified   . Diverticulitis   . DVT (deep venous thrombosis) ([place])   . Dyspnea 07/22/2015  . Edema 06/12/2014  . Essential and other specified forms of tremor   . GERD (gastroesophageal reflux disease)   . Gout, unspecified   . Internal hemorrhoids without mention of complication   . Macular degeneration 08/07/2015  . Osteoarthritis   . Other abnormal glucose   . Other and unspecified hyperlipidemia   . Personal history of colonic polyps   . Personal history of peptic ulcer disease   . Pulmonary embolism ([place])   . Unspecified disorder of bladder   . Unspecified essential hypertension   . Unspecified hypothyroidism   . Varicose veins     Past Surgical History:  Procedure Laterality Date  . ABDOMINAL HYSTERECTOMY    . CHOLECYSTECTOMY    . ENDOVENOUS ABLATION SAPHENOUS VEIN W/ LASER  09-11-2012   left greater saphenous vein   by Curt Jews MD  . EYE SURGERY    . KNEE ARTHROSCOPY     right   . NASAL SEPTUM SURGERY    . SHOULDER  SURGERY    . TOTAL KNEE ARTHROPLASTY  09/2013   Right    Family History  Problem Relation Age of Onset  . Other Mother        varicose veins  . Hip fracture Mother   . Heart disease Father        CHF  . Hyperlipidemia Father   . Heart attack Father   . Diabetes Daughter   . Heart disease Daughter   . Hyperlipidemia Daughter   . Hypertension Daughter   . Aneurysm Daughter   . Cancer Sister        stomach  . Heart disease Sister   . Heart disease Brother   . Colon cancer Neg Hx     Social History   Socioeconomic History  . Marital status: Widowed    Spouse name: Not on file  . Number of children: 3  . Years of education: Not on file  . Highest education level: Not on file  Occupational History  . Occupation: Retired   Tobacco Use  . Smoking status: Former Smoker    Years: 40.00    Types: Cigarettes    Quit date: 11/12/1988    Years since quitting: 31.8  . Smokeless tobacco: Never Used  Substance and Sexual Activity  . Alcohol use: Yes    Alcohol/week: 4.0 standard drinks    Types: 4  Standard drinks or equivalent per week    Comment: occasional wine   . Drug use: No  . Sexual activity: Never    Comment: lives alone, no dietary restrictions, widowed  Other Topics Concern  . Not on file  Social History Narrative  . Not on file   Social Determinants of Health   Financial Resource Strain:   . Difficulty of Paying Living Expenses: Not on file  Food Insecurity:   . Worried About Charity fundraiser in the Last Year: Not on file  . Ran Out of Food in the Last Year: Not on file  Transportation Needs:   . Lack of Transportation (Medical): Not on file  . Lack of Transportation (Non-Medical): Not on file  Physical Activity:   . Days of Exercise per Week: Not on file  . Minutes of Exercise per Session: Not on file  Stress:   . Feeling of Stress : Not on file  Social Connections:   . Frequency of Communication with Friends and Family: Not on file  . Frequency of  Social Gatherings with Friends and Family: Not on file  . Attends Religious Services: Not on file  . Active Member of Clubs or Organizations: Not on file  . Attends Archivist Meetings: Not on file  . Marital Status: Not on file  Intimate Partner Violence:   . Fear of Current or Ex-Partner: Not on file  . Emotionally Abused: Not on file  . Physically Abused: Not on file  . Sexually Abused: Not on file    Outpatient Medications Prior to Visit  Medication Sig Dispense Refill  . albuterol (PROAIR HFA) 108 (90 Base) MCG/ACT inhaler USE 2 INHALATIONS EVERY 6 HOURS AS NEEDED FOR WHEEZING OR SHORTNESS OF BREATH 25.5 g 1  . ALPRAZolam (XANAX) 0.5 MG tablet Take 1 tablet (0.5 mg total) by mouth at bedtime as needed for anxiety. 90 tablet 1  . aspirin 81 MG tablet Take 1 tablet (81 mg total) by mouth daily. 30 tablet   . co-enzyme Q-10 30 MG capsule Take 30 mg by mouth daily.     . febuxostat (ULORIC) 40 MG tablet TAKE 1 TABLET DAILY (FOR GOUT) 90 tablet 3  . fexofenadine (ALLEGRA ALLERGY) 180 MG tablet Take 1 tablet (180 mg total) by mouth daily. 90 tablet 1  . fluticasone (FLONASE) 50 MCG/ACT nasal spray Place 2 sprays into both nostrils daily. USE 2 SPRAYS IN EACH NOSTRIL DAILY AS NEEDED FOR ALLERGIES OR RHINITIS 16 g 5  . fluticasone (FLOVENT HFA) 110 MCG/ACT inhaler Inhale 2 puffs into the lungs 2 (two) times daily. 3 Inhaler 1  . furosemide (LASIX) 40 MG tablet Take 1 tablet (40 mg total) by mouth daily. 90 tablet 1  . gabapentin (NEURONTIN) 300 MG capsule Take 1 capsule (300 mg total) by mouth at bedtime. 90 capsule 1  . guaiFENesin (MUCINEX) 600 MG 12 hr tablet Take 1,200 mg by mouth 2 (two) times daily as needed for to loosen phlegm.     Marland Kitchen ipratropium-albuterol (DUONEB) 0.5-2.5 (3) MG/3ML SOLN USE THREE TIMES A DAY FOR NEXT 3 DAYS AND THEN AS NEEDED FOR WHEEZING. 60 mL 8  . pantoprazole (PROTONIX) 40 MG tablet Take 1 tablet (40 mg total) by mouth 2 (two) times daily before a meal.  180 tablet 1  . propranolol ER (INDERAL LA) 60 MG 24 hr capsule Take 1 capsule (60 mg total) by mouth daily. 180 capsule 1  . sucralfate (CARAFATE) 1 g tablet Take  1 tablet (1 g total) by mouth in the morning, at noon, in the evening, and at bedtime. 360 tablet 1  . SYNTHROID 50 MCG tablet Take 1 tablet (50 mcg total) by mouth daily before breakfast. 90 tablet 1   No facility-administered medications prior to visit.    Allergies  Allergen Reactions  . Simvastatin Other (See Comments)    Hip pain  . Crestor [Rosuvastatin]     Left hip pain  . Lipitor [Atorvastatin]     Pain in hips  . Red Dye     Hip pain  . Statins Other (See Comments)    Muscle aches, could not walk    Review of Systems  Constitutional: Negative for fever and malaise/fatigue.  HENT: Negative for congestion.   Eyes: Negative for blurred vision.  Respiratory: Negative for shortness of breath.   Cardiovascular: Positive for palpitations. Negative for chest pain and leg swelling.  Gastrointestinal: Negative for abdominal pain, blood in stool and nausea.  Genitourinary: Negative for dysuria and frequency.  Musculoskeletal: Negative for falls.  Skin: Negative for rash.  Neurological: Positive for dizziness. Negative for loss of consciousness and headaches.  Endo/Heme/Allergies: Negative for environmental allergies.  Psychiatric/Behavioral: Negative for depression. The patient is not nervous/anxious.        Objective:    Physical Exam Vitals and nursing note reviewed.  Constitutional:      General: She is not in acute distress.    Appearance: She is well-developed.  HENT:     Head: Normocephalic and atraumatic.     Nose: Nose normal.  Eyes:     General:        Right eye: No discharge.        Left eye: No discharge.  Cardiovascular:     Rate and Rhythm: Normal rate and regular rhythm.     Heart sounds: No murmur heard.   Pulmonary:     Effort: Pulmonary effort is normal.     Breath sounds: Normal  breath sounds.  Abdominal:     General: Bowel sounds are normal.     Palpations: Abdomen is soft.     Tenderness: There is no abdominal tenderness.  Musculoskeletal:     Cervical back: Normal range of motion and neck supple.  Skin:    General: Skin is warm and dry.  Neurological:     Mental Status: She is alert and oriented to person, place, and time.     BP 112/74 (BP Location: Left Arm)   Pulse 73   Temp 97.8 F (36.6 C) (Oral)   Resp 16   Wt 150 lb 12.8 oz (68.4 kg)   SpO2 98%   BMI 28.49 kg/m  Wt Readings from Last 3 Encounters:  09/13/20 150 lb 12.8 oz (68.4 kg)  06/08/20 148 lb (67.1 kg)  04/05/20 149 lb (67.6 kg)    Diabetic Foot Exam - Simple   No data filed     Lab Results  Component Value Date   WBC 7.7 09/13/2020   HGB 13.5 09/13/2020   HCT 39.7 09/13/2020   PLT 222 09/13/2020   GLUCOSE 110 (H) 09/13/2020   CHOL 201 (H) 09/13/2020   TRIG 123 09/13/2020   HDL 62 09/13/2020   LDLDIRECT 83.0 05/21/2019   LDLCALC 116 (H) 09/13/2020   ALT 7 09/13/2020   AST 22 09/13/2020   NA 143 09/13/2020   K 5.4 (H) 09/13/2020   CL 107 09/13/2020   CREATININE 1.18 (H) 09/13/2020   BUN 20  09/13/2020   CO2 28 09/13/2020   TSH 2.96 09/13/2020   INR 1.0 07/21/2010   HGBA1C 5.3 09/13/2020   MICROALBUR 0.9 01/31/2016    Lab Results  Component Value Date   TSH 2.96 09/13/2020   Lab Results  Component Value Date   WBC 7.7 09/13/2020   HGB 13.5 09/13/2020   HCT 39.7 09/13/2020   MCV 96.6 09/13/2020   PLT 222 09/13/2020   Lab Results  Component Value Date   NA 143 09/13/2020   K 5.4 (H) 09/13/2020   CO2 28 09/13/2020   GLUCOSE 110 (H) 09/13/2020   BUN 20 09/13/2020   CREATININE 1.18 (H) 09/13/2020   BILITOT 0.7 09/13/2020   ALKPHOS 91 11/16/2019   AST 22 09/13/2020   ALT 7 09/13/2020   PROT 6.9 09/13/2020   ALBUMIN 4.2 11/16/2019   CALCIUM 10.9 (H) 09/13/2020   ANIONGAP 9 12/21/2018   GFR 36.57 (L) 11/16/2019   Lab Results  Component Value Date     CHOL 201 (H) 09/13/2020   Lab Results  Component Value Date   HDL 62 09/13/2020   Lab Results  Component Value Date   LDLCALC 116 (H) 09/13/2020   Lab Results  Component Value Date   TRIG 123 09/13/2020   Lab Results  Component Value Date   CHOLHDL 3.2 09/13/2020   Lab Results  Component Value Date   HGBA1C 5.3 09/13/2020       Assessment & Plan:   Problem List Items Addressed This Visit    Hypothyroidism - Primary    On Synthroid DAW, continue to monitor. She does not tolerate the generic Levothyroxine.       Relevant Orders   TSH (Completed)   Hyperlipidemia, mixed    Encouraged heart healthy diet, increase exercise, avoid trans fats, consider a krill oil cap daily      Relevant Orders   Lipid panel (Completed)   Gout    No recent exacerbation.       Relevant Orders   CBC (Completed)   Anemia   Essential hypertension    Well controlled, no changes to meds. Encouraged heart healthy diet such as the DASH diet and exercise as tolerated.       Relevant Orders   Comprehensive metabolic panel (Completed)   Diabetes mellitus without complication ([place])   Relevant Orders   Hemoglobin A1c (Completed)   Sleep apnea    She is not currently using her CPAP but is willing to consider restarting. She is referred to pulmonology for further evaluation      Relevant Orders   Ambulatory referral to Pulmonology   High vitamin D level   CRI (chronic renal insufficiency)    Hydrate and monitor      Palpitations    She has continued to have episodes that sometimes have associated SOB and diaphoresis. She has undergone an event monitor but no concerns identified per patient. She is asked to get a Cartia mobile and record her episodes for review. She is interested in a referral to a new cardiologist she will let us know when she talks with family and decides whom she wants to see.          I am having [name] maintain her co-enzyme Q-10, aspirin,  fexofenadine, guaiFENesin, fluticasone, ipratropium-albuterol, albuterol, gabapentin, sucralfate, Synthroid, ALPRAZolam, Flovent HFA, furosemide, propranolol ER, febuxostat, and pantoprazole.  No orders of the defined types were placed in this encounter.    [name], MD

## 2020-09-15 DIAGNOSIS — I951 Orthostatic hypotension: Secondary | ICD-10-CM | POA: Diagnosis not present

## 2020-09-15 DIAGNOSIS — R42 Dizziness and giddiness: Secondary | ICD-10-CM | POA: Diagnosis not present

## 2020-09-15 DIAGNOSIS — H8113 Benign paroxysmal vertigo, bilateral: Secondary | ICD-10-CM | POA: Diagnosis not present

## 2020-09-15 LAB — CBC
HCT: 39.7 % (ref 35.0–45.0)
Hemoglobin: 13.5 g/dL (ref 11.7–15.5)
MCH: 32.8 pg (ref 27.0–33.0)
MCHC: 34 g/dL (ref 32.0–36.0)
MCV: 96.6 fL (ref 80.0–100.0)
MPV: 11.6 fL (ref 7.5–12.5)
Platelets: 222 10*3/uL (ref 140–400)
RBC: 4.11 10*6/uL (ref 3.80–5.10)
RDW: 12.4 % (ref 11.0–15.0)
WBC: 7.7 10*3/uL (ref 3.8–10.8)

## 2020-09-15 LAB — COMPREHENSIVE METABOLIC PANEL
AG Ratio: 1.7 (calc) (ref 1.0–2.5)
ALT: 7 U/L (ref 6–29)
AST: 22 U/L (ref 10–35)
Albumin: 4.3 g/dL (ref 3.6–5.1)
Alkaline phosphatase (APISO): 71 U/L (ref 37–153)
BUN/Creatinine Ratio: 17 (calc) (ref 6–22)
BUN: 20 mg/dL (ref 7–25)
CO2: 28 mmol/L (ref 20–32)
Calcium: 10.9 mg/dL — ABNORMAL HIGH (ref 8.6–10.4)
Chloride: 107 mmol/L (ref 98–110)
Creat: 1.18 mg/dL — ABNORMAL HIGH (ref 0.60–0.88)
Globulin: 2.6 g/dL (calc) (ref 1.9–3.7)
Glucose, Bld: 110 mg/dL — ABNORMAL HIGH (ref 65–99)
Potassium: 5.4 mmol/L — ABNORMAL HIGH (ref 3.5–5.3)
Sodium: 143 mmol/L (ref 135–146)
Total Bilirubin: 0.7 mg/dL (ref 0.2–1.2)
Total Protein: 6.9 g/dL (ref 6.1–8.1)

## 2020-09-15 LAB — VITAMIN D 25 HYDROXY (VIT D DEFICIENCY, FRACTURES): Vit D, 25-Hydroxy: 65 ng/mL (ref 30–100)

## 2020-09-15 LAB — LIPID PANEL
Cholesterol: 201 mg/dL — ABNORMAL HIGH (ref <200)
HDL: 62 mg/dL (ref >=50)
LDL Cholesterol (Calc): 116 mg/dL (calc) — ABNORMAL HIGH
Non-HDL Cholesterol (Calc): 139 mg/dL (calc) — ABNORMAL HIGH (ref <130)
Total CHOL/HDL Ratio: 3.2 (calc) (ref <5.0)
Triglycerides: 123 mg/dL (ref <150)

## 2020-09-15 LAB — HEMOGLOBIN A1C
Hgb A1c MFr Bld: 5.3 % of total Hgb (ref <5.7)
Mean Plasma Glucose: 105 (calc)
eAG (mmol/L): 5.8 (calc)

## 2020-09-15 LAB — TEST AUTHORIZATION

## 2020-09-15 LAB — TSH: TSH: 2.96 mIU/L (ref 0.40–4.50)

## 2020-09-20 ENCOUNTER — Other Ambulatory Visit: Payer: Self-pay | Admitting: Family Medicine

## 2020-09-22 DIAGNOSIS — H8113 Benign paroxysmal vertigo, bilateral: Secondary | ICD-10-CM | POA: Diagnosis not present

## 2020-09-22 DIAGNOSIS — I951 Orthostatic hypotension: Secondary | ICD-10-CM | POA: Diagnosis not present

## 2020-09-22 DIAGNOSIS — R42 Dizziness and giddiness: Secondary | ICD-10-CM | POA: Diagnosis not present

## 2020-09-29 DIAGNOSIS — I951 Orthostatic hypotension: Secondary | ICD-10-CM | POA: Diagnosis not present

## 2020-09-29 DIAGNOSIS — R42 Dizziness and giddiness: Secondary | ICD-10-CM | POA: Diagnosis not present

## 2020-09-29 DIAGNOSIS — H8113 Benign paroxysmal vertigo, bilateral: Secondary | ICD-10-CM | POA: Diagnosis not present

## 2020-10-03 ENCOUNTER — Other Ambulatory Visit: Payer: Self-pay | Admitting: Family Medicine

## 2020-10-11 DIAGNOSIS — R42 Dizziness and giddiness: Secondary | ICD-10-CM | POA: Diagnosis not present

## 2020-10-11 DIAGNOSIS — H8113 Benign paroxysmal vertigo, bilateral: Secondary | ICD-10-CM | POA: Diagnosis not present

## 2020-10-11 DIAGNOSIS — I951 Orthostatic hypotension: Secondary | ICD-10-CM | POA: Diagnosis not present

## 2020-10-12 ENCOUNTER — Telehealth: Payer: Self-pay | Admitting: Family Medicine

## 2020-10-12 ENCOUNTER — Other Ambulatory Visit: Payer: Self-pay

## 2020-10-12 ENCOUNTER — Other Ambulatory Visit: Payer: Self-pay | Admitting: Family Medicine

## 2020-10-12 ENCOUNTER — Other Ambulatory Visit (INDEPENDENT_AMBULATORY_CARE_PROVIDER_SITE_OTHER): Payer: Medicare Other

## 2020-10-12 ENCOUNTER — Other Ambulatory Visit: Payer: Self-pay | Admitting: *Deleted

## 2020-10-12 DIAGNOSIS — M109 Gout, unspecified: Secondary | ICD-10-CM

## 2020-10-12 DIAGNOSIS — E875 Hyperkalemia: Secondary | ICD-10-CM

## 2020-10-12 DIAGNOSIS — I10 Essential (primary) hypertension: Secondary | ICD-10-CM

## 2020-10-12 MED ORDER — PROPRANOLOL HCL ER 60 MG PO CP24
60.0000 mg | ORAL_CAPSULE | Freq: Every day | ORAL | 1 refills | Status: DC
Start: 2020-10-12 — End: 2020-10-12

## 2020-10-12 MED ORDER — PROPRANOLOL HCL ER 60 MG PO CP24
60.0000 mg | ORAL_CAPSULE | Freq: Every day | ORAL | 1 refills | Status: DC
Start: 2020-10-12 — End: 2021-02-22

## 2020-10-12 MED ORDER — GABAPENTIN 300 MG PO CAPS: 300.0000 mg | ORAL_CAPSULE | Freq: Every day | ORAL | 1 refills | Status: DC

## 2020-10-12 MED ORDER — ALPRAZOLAM 0.5 MG PO TABS
0.5000 mg | ORAL_TABLET | Freq: Every evening | ORAL | 1 refills | Status: DC | PRN
Start: 2020-10-12 — End: 2021-02-22

## 2020-10-12 NOTE — Telephone Encounter (Signed)
Requesting:Xanax Contract: UDS: Last Visit: Next Visit: Last Refill:05/2020  Please Advise

## 2020-10-12 NOTE — Telephone Encounter (Signed)
[facility]

## 2020-10-12 NOTE — Progress Notes (Signed)
.  comp

## 2020-10-12 NOTE — Telephone Encounter (Signed)
Pt came in office stating is needing refill on gabapentin (NEURONTIN) 300 MG capsule (pt only has 1 wk meds left- needing ASAP), needing ALPRAZolam (XANAX) 0.5 MG tablet  And also propranolol ER (INDERAL LA) 60 MG 24 hr capsule send to [facility], [place] [address], [place] [address]  Phone:  [phone] Fax:  [phone]    Please advise.

## 2020-10-13 ENCOUNTER — Telehealth: Payer: Self-pay

## 2020-10-13 LAB — COMPREHENSIVE METABOLIC PANEL WITH GFR
ALT: 6 U/L (ref 0–35)
AST: 19 U/L (ref 0–37)
Albumin: 4.4 g/dL (ref 3.5–5.2)
Alkaline Phosphatase: 83 U/L (ref 39–117)
BUN: 18 mg/dL (ref 6–23)
CO2: 32 meq/L (ref 19–32)
Calcium: 10.6 mg/dL — ABNORMAL HIGH (ref 8.4–10.5)
Chloride: 102 meq/L (ref 96–112)
Creatinine, Ser: 1.14 mg/dL (ref 0.40–1.20)
GFR: 43.52 mL/min — ABNORMAL LOW
Glucose, Bld: 94 mg/dL (ref 70–99)
Potassium: 4.5 meq/L (ref 3.5–5.1)
Sodium: 142 meq/L (ref 135–145)
Total Bilirubin: 0.6 mg/dL (ref 0.2–1.2)
Total Protein: 7.3 g/dL (ref 6.0–8.3)

## 2020-10-13 NOTE — Telephone Encounter (Signed)
-----   Message from Mosie Lukes, MD sent at 10/13/2020 10:54 AM EST ----- Notify potassium and renal functions now normal

## 2020-10-13 NOTE — Telephone Encounter (Signed)
Pt is aware of lab results.

## 2020-12-01 ENCOUNTER — Other Ambulatory Visit: Payer: Self-pay | Admitting: Family Medicine

## 2020-12-13 ENCOUNTER — Institutional Professional Consult (permissible substitution): Payer: Medicare Other | Admitting: Pulmonary Disease

## 2021-01-26 ENCOUNTER — Telehealth: Payer: Self-pay | Admitting: Family Medicine

## 2021-01-26 DIAGNOSIS — I6523 Occlusion and stenosis of bilateral carotid arteries: Secondary | ICD-10-CM

## 2021-01-26 DIAGNOSIS — I10 Essential (primary) hypertension: Secondary | ICD-10-CM

## 2021-01-26 NOTE — Telephone Encounter (Signed)
Patient would like a referral to Cardiology,  Please Advise   Patient would like to see   Champ Mungo. Lovena Le, MD Specialties and/or Subspecialties Cardiology, Electrophysiology [place] at [facility] [address] [place], [place] [address] [phone]

## 2021-01-26 NOTE — Telephone Encounter (Signed)
Cardiology referral placed

## 2021-01-27 ENCOUNTER — Telehealth: Payer: Self-pay | Admitting: Cardiovascular Disease

## 2021-01-27 NOTE — Telephone Encounter (Signed)
Error

## 2021-02-20 NOTE — Progress Notes (Signed)
Cardiology Clinic Note   Patient Name: Christina Mckinney Date of Encounter: 02/22/2021  Primary Care Provider:  Mosie Lukes, MD Primary Cardiologist:  Evalina Field, MD  Patient Profile    Christina Mckinney 87 year old female presents the clinic today for follow-up evaluation of her hypertension and hyperlipidemia  Past Medical History    Past Medical History:  Diagnosis Date  . Anemia, unspecified   . Anxiety and depression 09/18/2015  . Anxiety state, unspecified   . Chest pain   . Chronic systolic CHF (congestive heart failure) ([place]) 08/12/2015  . Depression with anxiety 06/02/2007   Qualifier: Diagnosis of  By: Wynona Luna   . Depressive disorder, not elsewhere classified   . Diverticulitis   . DVT (deep venous thrombosis) ([place])   . Dyspnea 07/22/2015  . Edema 06/12/2014  . Essential and other specified forms of tremor   . GERD (gastroesophageal reflux disease)   . Gout, unspecified   . Internal hemorrhoids without mention of complication   . Macular degeneration 08/07/2015  . Osteoarthritis   . Other abnormal glucose   . Other and unspecified hyperlipidemia   . Personal history of colonic polyps   . Personal history of peptic ulcer disease   . Pulmonary embolism ([place])   . Unspecified disorder of bladder   . Unspecified essential hypertension   . Unspecified hypothyroidism   . Varicose veins    Past Surgical History:  Procedure Laterality Date  . ABDOMINAL HYSTERECTOMY    . CHOLECYSTECTOMY    . ENDOVENOUS ABLATION SAPHENOUS VEIN W/ LASER  09-11-2012   left greater saphenous vein   by Curt Jews MD  . EYE SURGERY    . KNEE ARTHROSCOPY     right   . NASAL SEPTUM SURGERY    . SHOULDER SURGERY    . TOTAL KNEE ARTHROPLASTY  09/2013   Right    Allergies  Allergies  Allergen Reactions  . Simvastatin Other (See Comments)    Hip pain  . Crestor [Rosuvastatin]     Left hip pain  . Lipitor [Atorvastatin]     Pain in hips  . Red Dye     Hip pain  .  Statins Other (See Comments)    Muscle aches, could not walk    History of Present Illness    [name] has a PMH of carotid artery stenosis, HTN, HLD, and anxiety.  She was seen by Dr. Audie Box on 06/08/2020.  She was noted to have 1-39% carotid stenosis.  She wore a cardiac event monitor for palpitations.  Results were normal.  She reported that she felt well since her last visit.  She complained of constant dizziness.  Her blood pressure on exam was 106/50.  She reported compliance with her losartan and propranolol.  Her losartan was stopped.  She reported that she still had intermittent episodes of heart racing.  Her cardiac event monitor was reviewed.  Her carotid ultrasound was also reviewed.  There has been no change since 2008.  Plan once reviewed to recheck in 2-3 years.  She appeared very distressed and concerned about several issues.  However, her heart health was quite well.  She denied chest pain, shortness of breath, and palpitations.  She presents the clinic today for follow-up evaluation states she has noticed that since being off of her Cozaar her blood pressure has been elevated.  She reports home blood pressures in the 150s-high 160s over 70s and 80s.  She is not  as physically active because she feels like she does not have much energy.  She reports that her dizziness is much better than it was previously and she attributes it to vertigo.  She notices that she has increased palpitations at night and she continues to take her propranolol.  She complains of sinus drainage with the seasonal pollen/allergens.  I will increase her propranolol to 80 mg at bedtime, put her back on her 25 mg daily losartan, give her the salty 6 diet sheet, have her increase her physical activity as tolerated, and follow-up in 1 month.  We will also give her a blood pressure log.  Today she denies chest pain, shortness of breath, lower extremity edema, fatigue, palpitations, melena, hematuria, hemoptysis,  diaphoresis, weakness, presyncope, syncope, orthopnea, and PND.   Home Medications    Prior to Admission medications   Medication Sig Start Date End Date Taking? Authorizing Provider  albuterol (PROAIR HFA) 108 (90 Base) MCG/ACT inhaler Inhale 2 puffs into the lungs Mckinney 6 (six) hours as needed for wheezing or shortness of breath. 09/20/20   Mosie Lukes, MD  ALPRAZolam Duanne Moron) 0.5 MG tablet Take 1 tablet (0.5 mg total) by mouth at bedtime as needed for anxiety. 10/12/20   Mosie Lukes, MD  aspirin 81 MG tablet Take 1 tablet (81 mg total) by mouth daily. 04/29/12   Yoo, Doe-Hyun R, DO  co-enzyme Q-10 30 MG capsule Take 30 mg by mouth daily.     [provider]  febuxostat (ULORIC) 40 MG tablet TAKE 1 TABLET DAILY (FOR GOUT) 08/18/20   Mosie Lukes, MD  fexofenadine [facility] ALLERGY) 180 MG tablet Take 1 tablet (180 mg total) by mouth daily. 09/04/18   Mosie Lukes, MD  fluticasone (FLONASE) 50 MCG/ACT nasal spray Place 2 sprays into both nostrils daily. USE 2 SPRAYS IN EACH NOSTRIL DAILY AS NEEDED FOR ALLERGIES OR RHINITIS 09/09/19   Mosie Lukes, MD  fluticasone (FLOVENT HFA) 110 MCG/ACT inhaler Inhale 2 puffs into the lungs 2 (two) times daily. 06/13/20   Mosie Lukes, MD  furosemide (LASIX) 40 MG tablet Take 1 tablet (40 mg total) by mouth daily. 06/14/20   Mosie Lukes, MD  gabapentin (NEURONTIN) 300 MG capsule Take 1 capsule (300 mg total) by mouth at bedtime. 10/12/20   Mosie Lukes, MD  guaiFENesin (MUCINEX) 600 MG 12 hr tablet Take 1,200 mg by mouth 2 (two) times daily as needed for to loosen phlegm.     [provider]  ipratropium-albuterol (DUONEB) 0.5-2.5 (3) MG/3ML SOLN USE THREE TIMES A DAY FOR NEXT 3 DAYS AND THEN AS NEEDED FOR WHEEZING. 09/09/19   Mosie Lukes, MD  pantoprazole (PROTONIX) 40 MG tablet Take 1 tablet (40 mg total) by mouth 2 (two) times daily before a meal. 09/02/20   Mosie Lukes, MD  propranolol ER (INDERAL LA) 60 MG 24 hr  capsule Take 1 capsule (60 mg total) by mouth daily. 10/12/20   Mosie Lukes, MD  sucralfate (CARAFATE) 1 g tablet TAKE 1 TABLET IN THE MORNING, AT NOON, IN THE EVENING AND AT BEDTIME 10/03/20   Mosie Lukes, MD  SYNTHROID 50 MCG tablet Take 1 tablet (50 mcg total) by mouth daily before breakfast. 12/01/20   Mosie Lukes, MD    Family History    Family History  Problem Relation Age of Onset  . Other Mother        varicose veins  . Hip fracture Mother   .  Heart disease Father        CHF  . Hyperlipidemia Father   . Heart attack Father   . Diabetes Daughter   . Heart disease Daughter   . Hyperlipidemia Daughter   . Hypertension Daughter   . Aneurysm Daughter   . Cancer Sister        stomach  . Heart disease Sister   . Heart disease Brother   . Colon cancer Neg Hx    She indicated that her mother is deceased. She indicated that her father is deceased. She indicated that only one of her three sisters is alive. She indicated that all of her three brothers are deceased. She indicated that her maternal grandmother is deceased. She indicated that her maternal grandfather is deceased. She indicated that her paternal grandmother is deceased. She indicated that her paternal grandfather is deceased. She indicated that only one of her two daughters is alive. She indicated that her son is alive. She indicated that the status of her neg hx is unknown.  Social History    Social History   Socioeconomic History  . Marital status: Widowed    Spouse name: Not on file  . Number of children: 3  . Years of education: Not on file  . Highest education level: Not on file  Occupational History  . Occupation: Retired   Tobacco Use  . Smoking status: Former Smoker    Years: 40.00    Types: Cigarettes    Quit date: 11/12/1988    Years since quitting: 32.3  . Smokeless tobacco: Never Used  Substance and Sexual Activity  . Alcohol use: Yes    Alcohol/week: 4.0 standard drinks    Types: 4  Standard drinks or equivalent per week    Comment: occasional wine   . Drug use: No  . Sexual activity: Never    Comment: lives alone, no dietary restrictions, widowed  Other Topics Concern  . Not on file  Social History Narrative  . Not on file   Social Determinants of Health   Financial Resource Strain: Not on file  Food Insecurity: Not on file  Transportation Needs: Not on file  Physical Activity: Not on file  Stress: Not on file  Social Connections: Not on file  Intimate Partner Violence: Not on file     Review of Systems    General:  No chills, fever, night sweats or weight changes.  Cardiovascular:  No chest pain, dyspnea on exertion, edema, orthopnea, palpitations, paroxysmal nocturnal dyspnea. Dermatological: No rash, lesions/masses Respiratory: No cough, dyspnea Urologic: No hematuria, dysuria Abdominal:   No nausea, vomiting, diarrhea, bright red blood per rectum, melena, or hematemesis Neurologic:  No visual changes, wkns, changes in mental status. All other systems reviewed and are otherwise negative except as noted above.  Physical Exam    VS:  BP (!) 150/72 (BP Location: Left Arm, Patient Position: Sitting)   Pulse 67   Ht 5\' 1"  (5.400 m)   Wt 153 lb (69.4 kg)   SpO2 98%   BMI 28.91 kg/m  , BMI Body mass index is 28.91 kg/m. GEN: Well nourished, well developed, in no acute distress. HEENT: normal. Neck: Supple, no JVD, carotid bruits, or masses. Cardiac: RRR, no murmurs, rubs, or gallops. No clubbing, cyanosis, edema.  Radials/DP/PT 2+ and equal bilaterally.  Respiratory:  Respirations regular and unlabored, clear to auscultation bilaterally. GI: Soft, nontender, nondistended, BS + x 4. MS: no deformity or atrophy. Skin: warm and dry, no rash. Neuro:  Strength and sensation are intact. Psych: Normal affect.  Accessory Clinical Findings    Recent Labs: 09/13/2020: Hemoglobin 13.5; Platelets 222; TSH 2.96 10/12/2020: ALT 6; BUN 18; Creatinine, Ser  1.14; Potassium 4.5; Sodium 142   Recent Lipid Panel    Component Value Date/Time   CHOL 201 (H) 09/13/2020 1044   TRIG 123 09/13/2020 1044   HDL 62 09/13/2020 1044   CHOLHDL 3.2 09/13/2020 1044   VLDL 29.6 11/16/2019 1042   LDLCALC 116 (H) 09/13/2020 1044   LDLDIRECT 83.0 05/21/2019 1511    ECG personally reviewed by me today-normal sinus rhythm right bundle branch block 67 bpm- No acute changes  Cardiac event monitor 05/05/2020 1. No significant arrhythmias detected.  2. Rare ectopy.  3. Symptoms coincided with normal sinus rhythm.   Carotid US 04/15/2020 -Bilateral 1-39%  Assessment & Plan   1.  Essential hypertension-BP today 150/72.  Well-controlled at home.  Losartan previously stopped due to dizziness. Continue  furosemide Heart healthy low-sodium diet-salty 6 given Increase physical activity as tolerated Restart losartan 25 mg daily  Palpitations-continues to notice occasional episodes of increased heart rate.  Cardiac event monitor 05/05/2020 showed normal results.  Details above. Increase propranolol 80 mg at bedtime Avoid triggers caffeine, chocolate, EtOH, dehydration etc.  Hyperlipidemia-09/13/2020: Cholesterol 201; HDL 62; LDL Cholesterol (Calc) 116; Triglycerides 123 Statin intolerant/refuses statin, not interested in taking ezetimibe. Continue co-Q10 Heart healthy low-sodium high-fiber diet Increase physical activity as tolerated  Bilateral carotid stenosis-carotid ultrasound showed bilateral 1-39% bilateral stenosis Plan reviewed for repeat ultrasound in 2-3 years. Continue co-Q10 Heart healthy low-sodium high-fiber diet  Disposition: Follow-up with Dr. Audie Box in 6 months.  Jossie Ng. Charley Lafrance NP-C    02/22/2021, 10:39 AM [place] [address] Office [phone] Fax [phone]  Notice: This dictation was prepared with Dragon dictation along with smaller phrase technology. Any transcriptional errors that  result from this process are unintentional and may not be corrected upon review.  I spent 15 minutes examining this patient, reviewing medications, and using patient centered shared decision making involving her cardiac care.  Prior to her visit I spent greater than 20 minutes reviewing her past medical history,  medications, and prior cardiac tests.

## 2021-02-22 ENCOUNTER — Ambulatory Visit (INDEPENDENT_AMBULATORY_CARE_PROVIDER_SITE_OTHER): Payer: Medicare Other | Admitting: General Practice

## 2021-02-22 ENCOUNTER — Encounter: Payer: Self-pay | Admitting: General Practice

## 2021-02-22 ENCOUNTER — Telehealth: Payer: Self-pay

## 2021-02-22 ENCOUNTER — Other Ambulatory Visit: Payer: Self-pay

## 2021-02-22 VITALS — BP 150/72 | HR 67 | Ht 61.0 in | Wt 153.0 lb

## 2021-02-22 DIAGNOSIS — R002 Palpitations: Secondary | ICD-10-CM | POA: Diagnosis not present

## 2021-02-22 DIAGNOSIS — I10 Essential (primary) hypertension: Secondary | ICD-10-CM | POA: Diagnosis not present

## 2021-02-22 DIAGNOSIS — I6523 Occlusion and stenosis of bilateral carotid arteries: Secondary | ICD-10-CM

## 2021-02-22 DIAGNOSIS — E782 Mixed hyperlipidemia: Secondary | ICD-10-CM

## 2021-02-22 MED ORDER — LOSARTAN POTASSIUM 25 MG PO TABS: 25.0000 mg | ORAL_TABLET | Freq: Every day | ORAL | 6 refills | Status: DC

## 2021-02-22 MED ORDER — PROPRANOLOL HCL ER 80 MG PO CP24: 80.0000 mg | ORAL_CAPSULE | Freq: Every day | ORAL | 6 refills | Status: DC

## 2021-02-22 NOTE — Telephone Encounter (Signed)
That is fine with me.  [place] T. Audie Box, MD, [place]  [address] [place], [place] [address] [phone]  11:16 AM

## 2021-02-22 NOTE — Telephone Encounter (Signed)
Pt was her for appointment and she states that she would like to switch her MD back to Dr Martinique for future use. Is this OK?

## 2021-02-22 NOTE — Patient Instructions (Signed)
Medication Instructions:  START LOSARTAN 25MG  DAILY  INCREASE PROPRANOLOL 80MG  IN THE EVENING  START USING YOUR FLONASE AND MUCINEX *If you need a refill on your cardiac medications before your next appointment, please call your pharmacy*  Special Instructions TAKE AND LOG YOUR BLOOD PRESSURE DAILY-BRING WITH YOU TO YOUR FOLLOW UP APPOINTMENT  PLEASE READ AND FOLLOW SALTY 6-ATTACHED-1,800mg  daily  PLEASE INCREASE PHYSICAL ACTIVITY AS TOLERATED  Follow-Up: Your next appointment:  1 month(s) In Person with Eleonore Chiquito, MD OR IF UNAVAILABLE [place], FNP-C (OR 1st AVAILABLE)  At [facility], you and your health needs are our priority.  As part of our continuing mission to provide you with exceptional heart care, we have created designated Provider Care Teams.  These Care Teams include your primary Cardiologist (physician) and Advanced Practice Providers (APPs -  Physician Assistants and Nurse Practitioners) who all work together to provide you with the care you need, when you need it.  We recommend signing up for the patient portal called "MyChart".  Sign up information is provided on this After Visit Summary.  MyChart is used to connect with patients for Virtual Visits (Telemedicine).  Patients are able to view lab/test results, encounter notes, upcoming appointments, etc.  Non-urgent messages can be sent to your provider as well.   To learn more about what you can do with MyChart, go to [email or web address].              6 SALTY THINGS TO AVOID     1,800MG  DAILY

## 2021-03-01 ENCOUNTER — Other Ambulatory Visit: Payer: Self-pay | Admitting: Family Medicine

## 2021-03-10 ENCOUNTER — Telehealth: Payer: Self-pay | Admitting: *Deleted

## 2021-03-10 ENCOUNTER — Other Ambulatory Visit: Payer: Self-pay | Admitting: Family Medicine

## 2021-03-10 NOTE — Telephone Encounter (Signed)
Left message on machine to call back to schedule follow up appointment.  Patient is past due for follow up.

## 2021-03-21 ENCOUNTER — Ambulatory Visit: Payer: Medicare Other | Admitting: General Practice

## 2021-03-27 ENCOUNTER — Other Ambulatory Visit: Payer: Self-pay

## 2021-03-27 ENCOUNTER — Other Ambulatory Visit: Payer: Self-pay | Admitting: Physician Assistant

## 2021-03-27 ENCOUNTER — Encounter: Payer: Self-pay | Admitting: Physician Assistant

## 2021-03-27 ENCOUNTER — Ambulatory Visit (INDEPENDENT_AMBULATORY_CARE_PROVIDER_SITE_OTHER): Payer: Medicare Other | Admitting: Physician Assistant

## 2021-03-27 VITALS — BP 128/72 | HR 75 | Ht 61.5 in | Wt 152.0 lb

## 2021-03-27 DIAGNOSIS — I10 Essential (primary) hypertension: Secondary | ICD-10-CM | POA: Diagnosis not present

## 2021-03-27 DIAGNOSIS — I6523 Occlusion and stenosis of bilateral carotid arteries: Secondary | ICD-10-CM

## 2021-03-27 DIAGNOSIS — R002 Palpitations: Secondary | ICD-10-CM | POA: Diagnosis not present

## 2021-03-27 DIAGNOSIS — E785 Hyperlipidemia, unspecified: Secondary | ICD-10-CM

## 2021-03-27 DIAGNOSIS — J0191 Acute recurrent sinusitis, unspecified: Secondary | ICD-10-CM | POA: Diagnosis not present

## 2021-03-27 MED ORDER — LEVOFLOXACIN 500 MG PO TABS: 500.0000 mg | ORAL_TABLET | Freq: Two times a day (BID) | ORAL | 0 refills | Status: DC

## 2021-03-27 MED ORDER — LOSARTAN POTASSIUM 50 MG PO TABS: 50.0000 mg | ORAL_TABLET | Freq: Every day | ORAL | 3 refills | Status: DC

## 2021-03-27 MED ORDER — LEVOFLOXACIN 500 MG PO TABS: 500.0000 mg | ORAL_TABLET | Freq: Two times a day (BID) | ORAL | 0 refills | Status: AC

## 2021-03-27 MED ORDER — PROPRANOLOL HCL ER 80 MG PO CP24: 80.0000 mg | ORAL_CAPSULE | Freq: Every day | ORAL | 3 refills | Status: DC

## 2021-03-27 NOTE — Progress Notes (Signed)
Cardiology Office Note:    Date:  03/29/2021   ID:  Christina Mckinney, DOB Jan 30, 1932, MRN [id number]  PCP:  Christina Lukes, MD   [facility] Cardiologist:  Christina Field, MD {   Referring MD: Christina Lukes, MD   Chief Complaint  Patient presents with  . Follow-up    Seen for Christina Mckinney    History of Present Illness:    [name] is a 87 y.o. female with a hx of carotid artery stenosis, hypertension, hyperlipidemia intolerant of statins and anxiety.  He previously saw Christina Mckinney and wore a heart monitor for palpitation.  The result was normal.  Due to dizziness and the borderline low blood pressure, losartan was stopped.  He was last seen by Christina Mckinney on 02/22/2021 for elevated blood pressure after coming off of Cozaar.  Propranolol was increased to help cover her blood pressure and palpitation, she was also restarted on 25 mg daily of losartan.  Patient presents today for 1 month follow-up.  Patient is seen for follow-up today.  Instead of 25 mg daily of losartan, she has been taking 50 mg daily of losartan instead.  Her blood pressure has been perfect in the 120s to 130s at home based on home blood pressure diary.  She is having sinusitis issue again.  She says Dr. Wilburn Mckinney, her ENT doctor prescribed her some Levaquin last year that really helped her with sinus issue.  Now she is having chills and green sinus drainage again.  I will prescribe another course of Levaquin in this case.  However if her symptom does not get better, she will need to see Dr. Wilburn Mckinney of [facility] again.  She is still having nightly episode of palpitation.  I have reviewed her heart monitor from 2021, heart monitor at the time did not show significant irregular rhythm despite she was symptomatic.  Therefore I am not entirely clear if her palpitation is truly related to arrhythmia or anxiety issue.  I recommended continue on the current dose of propranolol and reassess after her sinus  issue is fixed.  She can follow-up with Christina Mckinney in 2 to 3 months.   Past Medical History:  Diagnosis Date  . Anemia, unspecified   . Anxiety and depression 09/18/2015  . Anxiety state, unspecified   . Chest pain   . Chronic systolic CHF (congestive heart failure) ([place]) 08/12/2015  . Depression with anxiety 06/02/2007   Qualifier: Diagnosis of  By: Christina Mckinney   . Depressive disorder, not elsewhere classified   . Diverticulitis   . DVT (deep venous thrombosis) ([place])   . Dyspnea 07/22/2015  . Edema 06/12/2014  . Essential and other specified forms of tremor   . GERD (gastroesophageal reflux disease)   . Gout, unspecified   . Internal hemorrhoids without mention of complication   . Macular degeneration 08/07/2015  . Osteoarthritis   . Other abnormal glucose   . Other and unspecified hyperlipidemia   . Personal history of colonic polyps   . Personal history of peptic ulcer disease   . Pulmonary embolism ([place])   . Unspecified disorder of bladder   . Unspecified essential hypertension   . Unspecified hypothyroidism   . Varicose veins     Past Surgical History:  Procedure Laterality Date  . ABDOMINAL HYSTERECTOMY    . CHOLECYSTECTOMY    . ENDOVENOUS ABLATION SAPHENOUS VEIN W/ LASER  09-11-2012   left greater saphenous vein  by Christina Jews MD  . EYE SURGERY    . KNEE ARTHROSCOPY     right   . NASAL SEPTUM SURGERY    . SHOULDER SURGERY    . TOTAL KNEE ARTHROPLASTY  09/2013   Right    Current Medications: Current Meds  Medication Sig  . albuterol (PROAIR HFA) 108 (90 Base) MCG/ACT inhaler Inhale 2 puffs into the lungs every 6 (six) hours as needed for wheezing or shortness of breath.  Marland Kitchen aspirin 81 MG tablet Take 1 tablet (81 mg total) by mouth daily.  Marland Kitchen co-enzyme Q-10 30 MG capsule Take 30 mg by mouth daily.  . febuxostat (ULORIC) 40 MG tablet TAKE 1 TABLET DAILY (FOR GOUT)  . fexofenadine (ALLEGRA ALLERGY) 180 MG tablet Take 1 tablet (180 mg total) by mouth daily.   . fluticasone (FLONASE) 50 MCG/ACT nasal spray Place 2 sprays into both nostrils daily. USE 2 SPRAYS IN EACH NOSTRIL DAILY AS NEEDED FOR ALLERGIES OR RHINITIS  . fluticasone (FLOVENT HFA) 110 MCG/ACT inhaler Inhale 2 puffs into the lungs 2 (two) times daily.  . furosemide (LASIX) 40 MG tablet TAKE 1 TABLET DAILY  . gabapentin (NEURONTIN) 300 MG capsule Take 1 capsule (300 mg total) by mouth at bedtime.  Marland Kitchen guaiFENesin (MUCINEX) 600 MG 12 hr tablet Take 1,200 mg by mouth 2 (two) times daily as needed for to loosen phlegm.   Marland Kitchen ipratropium-albuterol (DUONEB) 0.5-2.5 (3) MG/3ML SOLN USE THREE TIMES A DAY FOR NEXT 3 DAYS AND THEN AS NEEDED FOR WHEEZING.  . pantoprazole (PROTONIX) 40 MG tablet Take 1 tablet (40 mg total) by mouth 2 (two) times daily before a meal.  . sucralfate (CARAFATE) 1 g tablet TAKE 1 TABLET IN THE MORNING, AT NOON, IN THE EVENING AND AT BEDTIME  . SYNTHROID 50 MCG tablet Take 1 tablet (50 mcg total) by mouth daily before breakfast.  . [DISCONTINUED] levofloxacin (LEVAQUIN) 500 MG tablet Take 1 tablet (500 mg total) by mouth 2 (two) times daily for 14 days.  . [DISCONTINUED] losartan (COZAAR) 50 MG tablet Take 50 mg by mouth daily.  . [DISCONTINUED] propranolol ER (INDERAL LA) 80 MG 24 hr capsule Take 1 capsule (80 mg total) by mouth daily.     Allergies:   Simvastatin, Crestor [rosuvastatin], Lipitor [atorvastatin], Red dye, and Statins   Social History   Socioeconomic History  . Marital status: Widowed    Spouse name: Not on file  . Number of children: 3  . Years of education: Not on file  . Highest education level: Not on file  Occupational History  . Occupation: Retired   Tobacco Use  . Smoking status: Former Smoker    Years: 40.00    Types: Cigarettes    Quit date: 11/12/1988    Years since quitting: 32.3  . Smokeless tobacco: Never Used  Substance and Sexual Activity  . Alcohol use: Yes    Alcohol/week: 4.0 standard drinks    Types: 4 Standard drinks or  equivalent per week    Comment: occasional wine   . Drug use: No  . Sexual activity: Never    Comment: lives alone, no dietary restrictions, widowed  Other Topics Concern  . Not on file  Social History Narrative  . Not on file   Social Determinants of Health   Financial Resource Strain: Not on file  Food Insecurity: Not on file  Transportation Needs: Not on file  Physical Activity: Not on file  Stress: Not on file  Social Connections: Not  on file     Family History: The patient's family history includes Aneurysm in her daughter; Cancer in her sister; Diabetes in her daughter; Heart attack in her father; Heart disease in her brother, daughter, father, and sister; Hip fracture in her mother; Hyperlipidemia in her daughter and father; Hypertension in her daughter; Other in her mother. There is no history of Colon cancer.  ROS:   Please see the history of present illness.     All other systems reviewed and are negative.  EKGs/Labs/Other Studies Reviewed:    The following studies were reviewed today:  Echo 05/02/2017 LV EF: 55% -  60%   -------------------------------------------------------------------  Indications:   Dyspnea 786.09.   -------------------------------------------------------------------  History:  PMH: Hypothyroidism. Risk factors: Hypertension.  Diabetes mellitus. Dyslipidemia.   -------------------------------------------------------------------  Study Conclusions   - Left ventricle: The cavity size was normal. Wall thickness was  increased in a pattern of mild LVH. Systolic function was normal.  The estimated ejection fraction was in the range of 55% to 60%.  Wall motion was normal; there were no regional wall motion  abnormalities. Doppler parameters are consistent with abnormal  left ventricular relaxation (grade 1 diastolic dysfunction).  - Aortic valve: There was no stenosis.  - Mitral valve: Mildly calcified annulus. There was no  significant  regurgitation.  - Left atrium: The atrium was mildly dilated.  - Right ventricle: The cavity size was normal. Systolic function  was normal.  - Pulmonary arteries: PA peak pressure: 29 mm Hg (S).  - Systemic veins: IVC measured 2.2 cm with normal respirophasic  variation, suggesting RA pressure 8 mmHg.   Impressions:   - Nomral LV size with mild LV hypertrophy. EF 55-60%. Normal RV  size and systolic function. No significant valvular  abnormalities.   EKG:  EKG is not ordered today.  Recent Labs: 09/13/2020: Hemoglobin 13.5; Platelets 222; TSH 2.96 10/12/2020: ALT 6; BUN 18; Creatinine, Ser 1.14; Potassium 4.5; Sodium 142  Recent Lipid Panel    Component Value Date/Time   CHOL 201 (H) 09/13/2020 1044   TRIG 123 09/13/2020 1044   HDL 62 09/13/2020 1044   CHOLHDL 3.2 09/13/2020 1044   VLDL 29.6 11/16/2019 1042   LDLCALC 116 (H) 09/13/2020 1044   LDLDIRECT 83.0 05/21/2019 1511     Risk Assessment/Calculations:       Physical Exam:    VS:  BP 128/72   Pulse 75   Ht 5' 1.5" (1.562 m)   Wt 152 lb (68.9 kg)   SpO2 95%   BMI 28.26 kg/m     Wt Readings from Last 3 Encounters:  03/27/21 152 lb (68.9 kg)  02/22/21 153 lb (69.4 kg)  09/13/20 150 lb 12.8 oz (68.4 kg)     GEN:  Well nourished, well developed in no acute distress HEENT: Normal NECK: No JVD; No carotid bruits LYMPHATICS: No lymphadenopathy CARDIAC: RRR, no murmurs, rubs, gallops RESPIRATORY:  Clear to auscultation without rales, wheezing or rhonchi  ABDOMEN: Soft, non-tender, non-distended MUSCULOSKELETAL:  No edema; No deformity  SKIN: Warm and dry NEUROLOGIC:  Alert and oriented x 3 PSYCHIATRIC:  Normal affect   ASSESSMENT:    1. Essential hypertension   2. Acute recurrent sinusitis, unspecified location   3. Hyperlipidemia LDL goal <100   4. Palpitations    PLAN:    In order of problems listed above:  1. Hypertension: He started taking 25 mg of losartan, she is  actually taking 50 mg losartan.  Blood pressure has normalized.  We will continue on the current therapy  2. Acute recurrent sinusitis: Recently, she has been having chills, green drainage from her nose and recurrent sinus issue.  This is reminiscent of her sinusitis from late last year.  I will prescribe her a course of Levaquin which has worked for her previously.  If this does not fix her sinusitis, she has been instructed to see Dr. Wilburn Mckinney of [facility] again  3. Hyperlipidemia: Intolerant of statins  4. Palpitation: She has been having nightly episode of palpitation, however I recommend try to treat the sinusitis first, if palpitation still persist afterward, may consider increase beta-blocker.  She has not noticed any significant change after her propranolol was increased last time.        Medication Adjustments/Labs and Tests Ordered: Current medicines are reviewed at length with the patient today.  Concerns regarding medicines are outlined above.  No orders of the defined types were placed in this encounter.  Meds ordered this encounter  Medications  . DISCONTD: levofloxacin (LEVAQUIN) 500 MG tablet    Sig: Take 1 tablet (500 mg total) by mouth 2 (two) times daily for 14 days.    Dispense:  28 tablet    Refill:  0  . losartan (COZAAR) 50 MG tablet    Sig: Take 1 tablet (50 mg total) by mouth daily.    Dispense:  90 tablet    Refill:  3  . propranolol ER (INDERAL LA) 80 MG 24 hr capsule    Sig: Take 1 capsule (80 mg total) by mouth daily.    Dispense:  90 capsule    Refill:  3  . levofloxacin (LEVAQUIN) 500 MG tablet    Sig: Take 1 tablet (500 mg total) by mouth 2 (two) times daily for 14 days.    Dispense:  28 tablet    Refill:  0    Patient Instructions  Medication Instructions:   START Levofloxacin 500 mg 2 times a day for 14 days   *If you need a refill on your cardiac medications before your next appointment, please call your pharmacy*  Lab Work: NONE  ordered at this time of appointment   If you have labs (blood work) drawn today and your tests are completely normal, you will receive your results only by: Marland Kitchen MyChart Message (if you have MyChart) OR . A paper copy in the mail If you have any lab test that is abnormal or we need to change your treatment, we will call you to review the results.  Testing/Procedures: NONE ordered at this time of appointment   Follow-Up: At [facility], you and your health needs are our priority.  As part of our continuing mission to provide you with exceptional heart care, we have created designated Provider Care Teams.  These Care Teams include your primary Cardiologist (physician) and Advanced Practice Providers (APPs -  Physician Assistants and Nurse Practitioners) who all work together to provide you with the care you need, when you need it.  We recommend signing up for the patient portal called "MyChart".  Sign up information is provided on this After Visit Summary.  MyChart is used to connect with patients for Virtual Visits (Telemedicine).  Patients are able to view lab/test results, encounter notes, upcoming appointments, etc.  Non-urgent messages can be sent to your provider as well.   To learn more about what you can do with MyChart, go to [email or web address].    Your next appointment:   2-3 month(s)  The  format for your next appointment:   In Person  Provider:   Coletta Memos, FNP  Other Instructions      Signed, Almyra Deforest, [place]  03/29/2021 11:49 PM    [facility]

## 2021-03-27 NOTE — Patient Instructions (Signed)
Medication Instructions:   START Levofloxacin 500 mg 2 times a day for 14 days   *If you need a refill on your cardiac medications before your next appointment, please call your pharmacy*  Lab Work: NONE ordered at this time of appointment   If you have labs (blood work) drawn today and your tests are completely normal, you will receive your results only by: Marland Kitchen MyChart Message (if you have MyChart) OR . A paper copy in the mail If you have any lab test that is abnormal or we need to change your treatment, we will call you to review the results.  Testing/Procedures: NONE ordered at this time of appointment   Follow-Up: At [facility], you and your health needs are our priority.  As part of our continuing mission to provide you with exceptional heart care, we have created designated Provider Care Teams.  These Care Teams include your primary Cardiologist (physician) and Advanced Practice Providers (APPs -  Physician Assistants and Nurse Practitioners) who all work together to provide you with the care you need, when you need it.  We recommend signing up for the patient portal called "MyChart".  Sign up information is provided on this After Visit Summary.  MyChart is used to connect with patients for Virtual Visits (Telemedicine).  Patients are able to view lab/test results, encounter notes, upcoming appointments, etc.  Non-urgent messages can be sent to your provider as well.   To learn more about what you can do with MyChart, go to [email or web address].    Your next appointment:   2-3 month(s)  The format for your next appointment:   In Person  Provider:   Coletta Memos, FNP  Other Instructions

## 2021-03-29 ENCOUNTER — Encounter: Payer: Self-pay | Admitting: Physician Assistant

## 2021-04-17 ENCOUNTER — Telehealth: Payer: Self-pay

## 2021-04-17 ENCOUNTER — Other Ambulatory Visit: Payer: Self-pay | Admitting: Family Medicine

## 2021-04-17 DIAGNOSIS — H8110 Benign paroxysmal vertigo, unspecified ear: Secondary | ICD-10-CM

## 2021-04-17 NOTE — Telephone Encounter (Signed)
See below

## 2021-04-17 NOTE — Telephone Encounter (Signed)
I have placed the referral but it has been more than 6 months since I saw her she needs an appt for f/u

## 2021-04-17 NOTE — Telephone Encounter (Signed)
Pt called stating PCP has placed referrals for her in the past to [facility] [phone] per pt) for her ongoing vertigo issues that she has had for years resulting from an accident years ago.  She is requesting a referral to them for vertigo again.  Pt can be reached at [phone]

## 2021-04-18 DIAGNOSIS — I951 Orthostatic hypotension: Secondary | ICD-10-CM | POA: Diagnosis not present

## 2021-04-18 DIAGNOSIS — R42 Dizziness and giddiness: Secondary | ICD-10-CM | POA: Diagnosis not present

## 2021-04-18 DIAGNOSIS — H8113 Benign paroxysmal vertigo, bilateral: Secondary | ICD-10-CM | POA: Diagnosis not present

## 2021-04-19 NOTE — Telephone Encounter (Signed)
Per Dr. Charlett Blake: Called pt to get set up with a follow up visit.

## 2021-04-24 ENCOUNTER — Telehealth: Payer: Self-pay | Admitting: *Deleted

## 2021-04-24 DIAGNOSIS — H8113 Benign paroxysmal vertigo, bilateral: Secondary | ICD-10-CM | POA: Diagnosis not present

## 2021-04-24 DIAGNOSIS — I951 Orthostatic hypotension: Secondary | ICD-10-CM | POA: Diagnosis not present

## 2021-04-24 DIAGNOSIS — R42 Dizziness and giddiness: Secondary | ICD-10-CM | POA: Diagnosis not present

## 2021-04-24 NOTE — Telephone Encounter (Signed)
FYI Patient came into office because she thought she had an appt today.  There was nothing scheduled.  She then stated that her bp has been running high and that she would like to be evaluated.  She declined any chest pain, SOB, dizziness.  Appt scheduled with Percell Miller tomorrow.  Advised patient that if she starts to have any chest pain, SOB, or dizziness to call 911 or get to the ER immediately.

## 2021-04-25 ENCOUNTER — Encounter: Payer: Self-pay | Admitting: Family Medicine

## 2021-04-25 ENCOUNTER — Ambulatory Visit (INDEPENDENT_AMBULATORY_CARE_PROVIDER_SITE_OTHER): Payer: Medicare Other | Admitting: Family Medicine

## 2021-04-25 ENCOUNTER — Ambulatory Visit (HOSPITAL_BASED_OUTPATIENT_CLINIC_OR_DEPARTMENT_OTHER)
Admission: RE | Admit: 2021-04-25 | Discharge: 2021-04-25 | Disposition: A | Discharge status: Home / Self Care | Payer: Medicare Other | Source: Ambulatory Visit | Attending: Family Medicine | Admitting: Family Medicine

## 2021-04-25 ENCOUNTER — Other Ambulatory Visit: Payer: Self-pay

## 2021-04-25 VITALS — BP 140/70 | HR 69 | Temp 98.7°F | Resp 18 | Ht 61.5 in

## 2021-04-25 DIAGNOSIS — E538 Deficiency of other specified B group vitamins: Secondary | ICD-10-CM | POA: Diagnosis not present

## 2021-04-25 DIAGNOSIS — R2232 Localized swelling, mass and lump, left upper limb: Secondary | ICD-10-CM | POA: Diagnosis not present

## 2021-04-25 DIAGNOSIS — M545 Low back pain, unspecified: Secondary | ICD-10-CM | POA: Diagnosis not present

## 2021-04-25 DIAGNOSIS — M1A09X0 Idiopathic chronic gout, multiple sites, without tophus (tophi): Secondary | ICD-10-CM

## 2021-04-25 DIAGNOSIS — E785 Hyperlipidemia, unspecified: Secondary | ICD-10-CM | POA: Diagnosis not present

## 2021-04-25 DIAGNOSIS — L0291 Cutaneous abscess, unspecified: Secondary | ICD-10-CM | POA: Insufficient documentation

## 2021-04-25 DIAGNOSIS — N6012 Diffuse cystic mastopathy of left breast: Secondary | ICD-10-CM | POA: Diagnosis not present

## 2021-04-25 DIAGNOSIS — R5383 Other fatigue: Secondary | ICD-10-CM | POA: Diagnosis not present

## 2021-04-25 DIAGNOSIS — E039 Hypothyroidism, unspecified: Secondary | ICD-10-CM | POA: Diagnosis not present

## 2021-04-25 DIAGNOSIS — I6523 Occlusion and stenosis of bilateral carotid arteries: Secondary | ICD-10-CM

## 2021-04-25 DIAGNOSIS — I10 Essential (primary) hypertension: Secondary | ICD-10-CM | POA: Diagnosis not present

## 2021-04-25 DIAGNOSIS — N6011 Diffuse cystic mastopathy of right breast: Secondary | ICD-10-CM | POA: Diagnosis not present

## 2021-04-25 LAB — CBC WITH DIFFERENTIAL/PLATELET
Basophils Absolute: 0 10*3/uL (ref 0.0–0.1)
Basophils Relative: 0.4 % (ref 0.0–3.0)
Eosinophils Absolute: 0.3 10*3/uL (ref 0.0–0.7)
Eosinophils Relative: 4.6 % (ref 0.0–5.0)
HCT: 35.7 % — ABNORMAL LOW (ref 36.0–46.0)
Hemoglobin: 12 g/dL (ref 12.0–15.0)
Lymphocytes Relative: 23.8 % (ref 12.0–46.0)
Lymphs Abs: 1.8 10*3/uL (ref 0.7–4.0)
MCHC: 33.5 g/dL (ref 30.0–36.0)
MCV: 94.9 fl (ref 78.0–100.0)
Monocytes Absolute: 0.6 10*3/uL (ref 0.1–1.0)
Monocytes Relative: 7.4 % (ref 3.0–12.0)
Neutro Abs: 4.8 10*3/uL (ref 1.4–7.7)
Neutrophils Relative %: 63.8 % (ref 43.0–77.0)
Platelets: 182 10*3/uL (ref 150.0–400.0)
RBC: 3.76 Mil/uL — ABNORMAL LOW (ref 3.87–5.11)
RDW: 14.2 % (ref 11.5–15.5)
WBC: 7.5 10*3/uL (ref 4.0–10.5)

## 2021-04-25 LAB — VITAMIN B12: Vitamin B-12: 754 pg/mL (ref 211–911)

## 2021-04-25 LAB — COMPREHENSIVE METABOLIC PANEL
ALT: 5 U/L (ref 0–35)
AST: 20 U/L (ref 0–37)
Albumin: 4.1 g/dL (ref 3.5–5.2)
Alkaline Phosphatase: 76 U/L (ref 39–117)
BUN: 21 mg/dL (ref 6–23)
CO2: 31 mEq/L (ref 19–32)
Calcium: 10.1 mg/dL (ref 8.4–10.5)
Chloride: 102 mEq/L (ref 96–112)
Creatinine, Ser: 1.28 mg/dL — ABNORMAL HIGH (ref 0.40–1.20)
GFR: 37.73 mL/min — ABNORMAL LOW (ref >60.00)
Glucose, Bld: 100 mg/dL — ABNORMAL HIGH (ref 70–99)
Potassium: 4.7 mEq/L (ref 3.5–5.1)
Sodium: 141 mEq/L (ref 135–145)
Total Bilirubin: 0.6 mg/dL (ref 0.2–1.2)
Total Protein: 6.7 g/dL (ref 6.0–8.3)

## 2021-04-25 LAB — LIPID PANEL
Cholesterol: 176 mg/dL (ref 0–200)
HDL: 46.4 mg/dL (ref >39.00)
LDL Cholesterol: 103 mg/dL — ABNORMAL HIGH (ref 0–99)
NonHDL: 129.22
Total CHOL/HDL Ratio: 4
Triglycerides: 133 mg/dL (ref 0.0–149.0)
VLDL: 26.6 mg/dL (ref 0.0–40.0)

## 2021-04-25 LAB — TSH: TSH: 4.99 u[IU]/mL — ABNORMAL HIGH (ref 0.35–4.50)

## 2021-04-25 LAB — URIC ACID: Uric Acid, Serum: 7.8 mg/dL — ABNORMAL HIGH (ref 2.4–7.0)

## 2021-04-25 MED ORDER — SULFAMETHOXAZOLE-TRIMETHOPRIM 800-160 MG PO TABS: 1.0000 | ORAL_TABLET | Freq: Two times a day (BID) | ORAL | 0 refills | Status: DC

## 2021-04-25 NOTE — Assessment & Plan Note (Signed)
abx per orders Warm compresses  Diagnostic mamm---  Consider surgery consultation if no improvement

## 2021-04-25 NOTE — Assessment & Plan Note (Signed)
Check labs 

## 2021-04-25 NOTE — Patient Instructions (Signed)
Skin Abscess  A skin abscess is an infected area on or under your skin that contains a collection of pus and other material. An abscess may also be called a furuncle,carbuncle, or boil. An abscess can occur in or on almost any part of your body. Some abscesses break open (rupture) on their own. Most continue to get worse unless they are treated. The infection can spread deeper into the body and eventually into your blood, whichcan make you feel ill. Treatment usually involves draining the abscess. What are the causes? An abscess occurs when germs, like bacteria, pass through your skin and cause an infection. This may be caused by: A scrape or cut on your skin. A puncture wound through your skin, including a needle injection or insect bite. Blocked oil or sweat glands. Blocked and infected hair follicles. A cyst that forms beneath your skin (sebaceous cyst) and becomes infected. What increases the risk? This condition is more likely to develop in people who: Have a weak body defense system (immune system). Have diabetes. Have dry and irritated skin. Get frequent injections or use illegal IV drugs. Have a foreign body in a wound, such as a splinter. Have problems with their lymph system or veins. What are the signs or symptoms? Symptoms of this condition include: A painful, firm bump under the skin. A bump with pus at the top. This may break through the skin and drain. Other symptoms include: Redness surrounding the abscess site. Warmth. Swelling of the lymph nodes (glands) near the abscess. Tenderness. A sore on the skin. How is this diagnosed? This condition may be diagnosed based on: A physical exam. Your medical history. A sample of pus. This may be used to find out what is causing the infection. Blood tests. Imaging tests, such as an ultrasound, CT scan, or MRI. How is this treated? A small abscess that drains on its own may not need treatment. Treatment for larger abscesses  may include: Moist heat or heat pack applied to the area several times a day. A procedure to drain the abscess (incision and drainage). Antibiotic medicines. For a severe abscess, you may first get antibiotics through an IV and then change to antibiotics by mouth. Follow these instructions at home: Medicines  Take over-the-counter and prescription medicines only as told by your health care provider. If you were prescribed an antibiotic medicine, take it as told by your health care provider. Do not stop taking the antibiotic even if you start to feel better.  Abscess care  If you have an abscess that has not drained, apply heat to the affected area. Use the heat source that your health care provider recommends, such as a moist heat pack or a heating pad. Place a towel between your skin and the heat source. Leave the heat on for 20-30 minutes. Remove the heat if your skin turns bright red. This is especially important if you are unable to feel pain, heat, or cold. You may have a greater risk of getting burned. Follow instructions from your health care provider about how to take care of your abscess. Make sure you: Cover the abscess with a bandage (dressing). Change your dressing or gauze as told by your health care provider. Wash your hands with soap and water before you change the dressing or gauze. If soap and water are not available, use hand sanitizer. Check your abscess every day for signs of a worsening infection. Check for: More redness, swelling, or pain. More fluid or blood. Warmth. More   pus or a bad smell.  General instructions To avoid spreading the infection: Do not share personal care items, towels, or hot tubs with others. Avoid making skin contact with other people. Keep all follow-up visits as told by your health care provider. This is important. Contact a health care provider if you have: More redness, swelling, or pain around your abscess. More fluid or blood coming  from your abscess. Warm skin around your abscess. More pus or a bad smell coming from your abscess. A fever. Muscle aches. Chills or a general ill feeling. Get help right away if you: Have severe pain. See red streaks on your skin spreading away from the abscess. Summary A skin abscess is an infected area on or under your skin that contains a collection of pus and other material. A small abscess that drains on its own may not need treatment. Treatment for larger abscesses may include having a procedure to drain the abscess and taking an antibiotic. This information is not intended to replace advice given to you by your health care provider. Make sure you discuss any questions you have with your healthcare provider. Document Revised: 02/19/2019 Document Reviewed: 12/12/2017 Elsevier Patient Education  2022 [employer]  

## 2021-04-25 NOTE — Assessment & Plan Note (Signed)
Check labs today F/u pcp

## 2021-04-25 NOTE — Progress Notes (Signed)
Subjective:   By signing my name below, I, Christina Mckinney, attest that this documentation has been prepared under the direction and in the presence of Dr. Roma Schanz, DO. 04/25/2021     Patient ID: Christina Mckinney, female    DOB: [birth year], 87 y.o.   MRN: [id number]  Chief Complaint  Patient presents with   Hypertension   Follow-up   Mass    Pt states having lump under left arm pit x1 week    HPI Patient is in today for a office visit. She complains of a mass on her left axilla.  She was taking a antibiotic for the past 2 weeks to manage a sinus infection. This was prescribed by her cardiologists PA.  She also complains of constant low back pain and fatigue. She reports while taking gabapentin, her legs were hot. Since she started experiencing these symptoms she has stopped taking it. She has a history of arthritis in her left knee. She gets injections to manage her pain and reports having almost no pain for the past 2 years since starting.  Her feet is swelling recently. She continues taking fluid medication. She continues taking blood pressure medication but not blood thinners at this time.  She complains her symptoms of gout has worsened recently. In the past her symptoms worsened and she had to be put on a wheelchair for 2 months.  She recently was diagnosed with a irregular heart beat by her cardiologist. Her cardiologist is going to screen further for this issue.  She is due for a mammogram. Her GYN, Dr. Radene Knee, performs her mammograms. She has stopped taking her thyroid medication. She has a family history of thyroid issues. Her son has thyroid issues.     Past Medical History:  Diagnosis Date   Anemia, unspecified    Anxiety and depression 09/18/2015   Anxiety state, unspecified    Chest pain    Chronic systolic CHF (congestive heart failure) ([place]) 08/12/2015   Depression with anxiety 06/02/2007   Qualifier: Diagnosis of  By: Wynona Luna    Depressive  disorder, not elsewhere classified    Diverticulitis    DVT (deep venous thrombosis) ([place])    Dyspnea 07/22/2015   Edema 06/12/2014   Essential and other specified forms of tremor    GERD (gastroesophageal reflux disease)    Gout, unspecified    Internal hemorrhoids without mention of complication    Macular degeneration 08/07/2015   Osteoarthritis    Other abnormal glucose    Other and unspecified hyperlipidemia    Personal history of colonic polyps    Personal history of peptic ulcer disease    Pulmonary embolism ([facility])    Unspecified disorder of bladder    Unspecified essential hypertension    Unspecified hypothyroidism    Varicose veins     Past Surgical History:  Procedure Laterality Date   ABDOMINAL HYSTERECTOMY     CHOLECYSTECTOMY     ENDOVENOUS ABLATION SAPHENOUS VEIN W/ LASER  09-11-2012   left greater saphenous vein   by Curt Jews MD   EYE SURGERY     KNEE ARTHROSCOPY     right    NASAL SEPTUM SURGERY     SHOULDER SURGERY     TOTAL KNEE ARTHROPLASTY  09/2013   Right    Family History  Problem Relation Age of Onset   Other Mother        varicose veins   Hip fracture Mother    Heart disease  Father        CHF   Hyperlipidemia Father    Heart attack Father    Diabetes Daughter    Heart disease Daughter    Hyperlipidemia Daughter    Hypertension Daughter    Aneurysm Daughter    Cancer Sister        stomach   Heart disease Sister    Heart disease Brother    Colon cancer Neg Hx     Social History   Socioeconomic History   Marital status: Widowed    Spouse name: Not on file   Number of children: 3   Years of education: Not on file   Highest education level: Not on file  Occupational History   Occupation: Retired   Tobacco Use   Smoking status: Former    Years: 40.00    Pack years: 0.00    Types: Cigarettes    Quit date: 11/12/1988    Years since quitting: 32.4   Smokeless tobacco: Never  Substance and Sexual Activity   Alcohol use: Yes     Alcohol/week: 4.0 standard drinks    Types: 4 Standard drinks or equivalent per week    Comment: occasional wine    Drug use: No   Sexual activity: Never    Comment: lives alone, no dietary restrictions, widowed  Other Topics Concern   Not on file  Social History Narrative   Not on file   Social Determinants of Health   Financial Resource Strain: Not on file  Food Insecurity: Not on file  Transportation Needs: Not on file  Physical Activity: Not on file  Stress: Not on file  Social Connections: Not on file  Intimate Partner Violence: Not on file    Outpatient Medications Prior to Visit  Medication Sig Dispense Refill   albuterol (PROAIR HFA) 108 (90 Base) MCG/ACT inhaler Inhale 2 puffs into the lungs Mckinney 6 (six) hours as needed for wheezing or shortness of breath. 54 g 3   aspirin 81 MG tablet Take 1 tablet (81 mg total) by mouth daily. 30 tablet    co-enzyme Q-10 30 MG capsule Take 30 mg by mouth daily.     febuxostat (ULORIC) 40 MG tablet TAKE 1 TABLET DAILY (FOR GOUT) 90 tablet 3   fexofenadine (ALLEGRA ALLERGY) 180 MG tablet Take 1 tablet (180 mg total) by mouth daily. 90 tablet 1   fluticasone (FLONASE) 50 MCG/ACT nasal spray Place 2 sprays into both nostrils daily. USE 2 SPRAYS IN EACH NOSTRIL DAILY AS NEEDED FOR ALLERGIES OR RHINITIS 16 g 5   fluticasone (FLOVENT HFA) 110 MCG/ACT inhaler Inhale 2 puffs into the lungs 2 (two) times daily. 3 Inhaler 1   furosemide (LASIX) 40 MG tablet TAKE 1 TABLET DAILY 90 tablet 3   gabapentin (NEURONTIN) 300 MG capsule Take 1 capsule (300 mg total) by mouth at bedtime. 90 capsule 1   guaiFENesin (MUCINEX) 600 MG 12 hr tablet Take 1,200 mg by mouth 2 (two) times daily as needed for to loosen phlegm.      ipratropium-albuterol (DUONEB) 0.5-2.5 (3) MG/3ML SOLN USE THREE TIMES A DAY FOR NEXT 3 DAYS AND THEN AS NEEDED FOR WHEEZING. 60 mL 8   losartan (COZAAR) 50 MG tablet Take 1 tablet (50 mg total) by mouth daily. 90 tablet 3   pantoprazole  (PROTONIX) 40 MG tablet Take 1 tablet (40 mg total) by mouth 2 (two) times daily before a meal. 180 tablet 1   propranolol ER (INDERAL LA) 80 MG  24 hr capsule Take 1 capsule (80 mg total) by mouth daily. 90 capsule 3   sucralfate (CARAFATE) 1 g tablet TAKE 1 TABLET IN THE MORNING, AT NOON, IN THE EVENING AND AT BEDTIME 360 tablet 3   SYNTHROID 50 MCG tablet Take 1 tablet (50 mcg total) by mouth daily before breakfast. 90 tablet 1   No facility-administered medications prior to visit.    Allergies  Allergen Reactions   Simvastatin Other (See Comments)    Hip pain   Crestor [Rosuvastatin]     Left hip pain   Lipitor [Atorvastatin]     Pain in hips   Red Dye     Hip pain   Statins Other (See Comments)    Muscle aches, could not walk    Review of Systems  Constitutional:  Positive for malaise/fatigue. Negative for chills and fever.  HENT:  Negative for congestion and hearing loss.   Eyes:  Negative for discharge.  Respiratory:  Negative for cough, sputum production and shortness of breath.   Cardiovascular:  Negative for chest pain, palpitations and leg swelling.  Gastrointestinal:  Negative for abdominal pain, blood in stool, constipation, diarrhea, heartburn, nausea and vomiting.  Genitourinary:  Negative for dysuria, frequency, hematuria and urgency.  Musculoskeletal:  Positive for back pain (low back) and myalgias (Left axilla). Negative for falls.  Skin:  Negative for rash.  Neurological:  Negative for dizziness, sensory change, loss of consciousness, weakness and headaches.  Endo/Heme/Allergies:  Negative for environmental allergies. Does not bruise/bleed easily.  Psychiatric/Behavioral:  Negative for depression and suicidal ideas. The patient is not nervous/anxious and does not have insomnia.       Objective:    Physical Exam Vitals and nursing note reviewed.  Constitutional:      General: She is not in acute distress.    Appearance: Normal appearance. She is not  ill-appearing.  HENT:     Head: Normocephalic and atraumatic.     Right Ear: External ear normal.     Left Ear: External ear normal.  Eyes:     Extraocular Movements: Extraocular movements intact.     Pupils: Pupils are equal, round, and reactive to light.  Cardiovascular:     Rate and Rhythm: Normal rate and regular rhythm.     Pulses: Normal pulses.     Heart sounds: Normal heart sounds. No murmur heard.   No gallop.  Pulmonary:     Effort: Pulmonary effort is normal. No respiratory distress.     Breath sounds: Normal breath sounds. No wheezing, rhonchi or rales.  Musculoskeletal:     Comments: Swollen tender cyst on left axilla.  Skin:    General: Skin is warm and dry.  Neurological:     General: No focal deficit present.     Mental Status: She is alert and oriented to person, place, and time.     Motor: No weakness.     Coordination: Coordination normal.     Gait: Gait normal.  Psychiatric:        Behavior: Behavior normal.    BP 140/70 (BP Location: Right Arm, Patient Position: Sitting, Cuff Size: Normal)   Pulse 69   Temp 98.7 F (37.1 C) (Oral)   Resp 18   Ht 5' 1.5" (1.562 m)   SpO2 95%   BMI 28.26 kg/m  Wt Readings from Last 3 Encounters:  03/27/21 152 lb (68.9 kg)  02/22/21 153 lb (69.4 kg)  09/13/20 150 lb 12.8 oz (68.4 kg)  Diabetic Foot Exam - Simple   No data filed    Lab Results  Component Value Date   WBC 7.7 09/13/2020   HGB 13.5 09/13/2020   HCT 39.7 09/13/2020   PLT 222 09/13/2020   GLUCOSE 94 10/12/2020   CHOL 201 (H) 09/13/2020   TRIG 123 09/13/2020   HDL 62 09/13/2020   LDLDIRECT 83.0 05/21/2019   LDLCALC 116 (H) 09/13/2020   ALT 6 10/12/2020   AST 19 10/12/2020   NA 142 10/12/2020   K 4.5 10/12/2020   CL 102 10/12/2020   CREATININE 1.14 10/12/2020   BUN 18 10/12/2020   CO2 32 10/12/2020   TSH 2.96 09/13/2020   INR 1.0 07/21/2010   HGBA1C 5.3 09/13/2020   MICROALBUR 0.9 01/31/2016    Lab Results  Component Value Date    TSH 2.96 09/13/2020   Lab Results  Component Value Date   WBC 7.7 09/13/2020   HGB 13.5 09/13/2020   HCT 39.7 09/13/2020   MCV 96.6 09/13/2020   PLT 222 09/13/2020   Lab Results  Component Value Date   NA 142 10/12/2020   K 4.5 10/12/2020   CO2 32 10/12/2020   GLUCOSE 94 10/12/2020   BUN 18 10/12/2020   CREATININE 1.14 10/12/2020   BILITOT 0.6 10/12/2020   ALKPHOS 83 10/12/2020   AST 19 10/12/2020   ALT 6 10/12/2020   PROT 7.3 10/12/2020   ALBUMIN 4.4 10/12/2020   CALCIUM 10.6 (H) 10/12/2020   ANIONGAP 9 12/21/2018   GFR 43.52 (L) 10/12/2020   Lab Results  Component Value Date   CHOL 201 (H) 09/13/2020   Lab Results  Component Value Date   HDL 62 09/13/2020   Lab Results  Component Value Date   LDLCALC 116 (H) 09/13/2020   Lab Results  Component Value Date   TRIG 123 09/13/2020   Lab Results  Component Value Date   CHOLHDL 3.2 09/13/2020   Lab Results  Component Value Date   HGBA1C 5.3 09/13/2020       Assessment & Plan:   Problem List Items Addressed This Visit       Unprioritized   Gout   Relevant Orders   Uric acid   Hypothyroidism   Relevant Orders   TSH   Vitamin B12   Other Visit Diagnoses     Abscess    -  Primary   Relevant Medications   sulfamethoxazole-trimethoprim (BACTRIM DS) 800-160 MG tablet   Other Relevant Orders   MM Digital Diagnostic Bilat   US BREAST LTD UNI LEFT INC AXILLA   Vitamin B12   Mass of left axilla       Relevant Orders   MM Digital Diagnostic Bilat   US BREAST LTD UNI LEFT INC AXILLA   Vitamin B12   Fibrocystic breast changes of both breasts       Relevant Orders   MM Digital Diagnostic Bilat   US BREAST LTD UNI LEFT INC AXILLA   Vitamin B12   Acute bilateral low back pain without sciatica       Relevant Orders   DG Lumbar Spine Complete   Vitamin B12   Primary hypertension       Relevant Orders   Vitamin B12   Hyperlipidemia, unspecified hyperlipidemia type       Relevant Orders   Lipid  panel   Comprehensive metabolic panel   Vitamin X32   Other fatigue       Relevant Orders   CBC with  Differential/Platelet   TSH   Vitamin B12   Low energy       Relevant Orders   Vitamin B12   B12 deficiency       Relevant Orders   Vitamin B12        Meds ordered this encounter  Medications   sulfamethoxazole-trimethoprim (BACTRIM DS) 800-160 MG tablet    Sig: Take 1 tablet by mouth 2 (two) times daily.    Dispense:  20 tablet    Refill:  0    I, Dr. Roma Schanz, DO, personally preformed the services described in this documentation.  All medical record entries made by the scribe were at my direction and in my presence.  I have reviewed the chart and discharge instructions (if applicable) and agree that the record reflects my personal performance and is accurate and complete. 04/25/2021   I,Christina Mckinney,acting as a Education administrator for [employer], DO.,have documented all relevant documentation on the behalf of Christina Held, DO,as directed by  Christina Held, DO while in the presence of Christina Held, DO.   Christina Held, DO

## 2021-04-25 NOTE — Assessment & Plan Note (Signed)
Check xray  F/u pcp

## 2021-05-02 DIAGNOSIS — H8113 Benign paroxysmal vertigo, bilateral: Secondary | ICD-10-CM | POA: Diagnosis not present

## 2021-05-02 DIAGNOSIS — I951 Orthostatic hypotension: Secondary | ICD-10-CM | POA: Diagnosis not present

## 2021-05-02 DIAGNOSIS — R42 Dizziness and giddiness: Secondary | ICD-10-CM | POA: Diagnosis not present

## 2021-05-03 ENCOUNTER — Other Ambulatory Visit: Payer: Self-pay | Admitting: Family Medicine

## 2021-05-03 ENCOUNTER — Telehealth: Payer: Self-pay | Admitting: *Deleted

## 2021-05-03 DIAGNOSIS — R2232 Localized swelling, mass and lump, left upper limb: Secondary | ICD-10-CM

## 2021-05-03 DIAGNOSIS — N6011 Diffuse cystic mastopathy of right breast: Secondary | ICD-10-CM

## 2021-05-03 DIAGNOSIS — L0291 Cutaneous abscess, unspecified: Secondary | ICD-10-CM

## 2021-05-03 MED ORDER — ULORIC 40 MG PO TABS: 40.0000 mg | ORAL_TABLET | Freq: Every day | ORAL | 1 refills | Status: DC

## 2021-05-03 MED ORDER — FEBUXOSTAT 40 MG PO TABS: ORAL_TABLET | ORAL | 1 refills | Status: DC

## 2021-05-03 NOTE — Addendum Note (Signed)
Addended by: Kem Boroughs D on: 05/03/2021 04:44 PM   Modules accepted: Orders

## 2021-05-03 NOTE — Telephone Encounter (Signed)
Spoke with patient for about 69min.  She has not been taking the generic Uloric because she stated that it caused dizziness.  She declined allopurinol.  I will send in for brand Uloric again and see what happens and she stated she will take the generic until we hopefully get the brand approved.  If not she will take until we follow up in 3 months.  Advised patient that if she does not take medication that she will have more problems than just dizziness.  Rx for brand Uloric sent in.  Patient has follow up appointment scheduled in 3 months.

## 2021-05-03 NOTE — Telephone Encounter (Signed)
-----   Message from Mosie Lukes, MD sent at 05/02/2021  4:45 PM EDT ----- Thanks I am good with Allopurinol 100 mg tabs, 1 tab po daily for her elevated uric acid and at her level of renal dysfunction. Will have nursing confirm with patient she is willing to do that and send in #30 with 3 rf and make sure she has f/u labs in 3 months

## 2021-05-04 ENCOUNTER — Telehealth: Payer: Self-pay | Admitting: *Deleted

## 2021-05-04 NOTE — Telephone Encounter (Addendum)
Brand over generic prior auth form faxed over to insurance   Awaiting determination.

## 2021-05-05 ENCOUNTER — Other Ambulatory Visit: Payer: Self-pay

## 2021-05-05 ENCOUNTER — Ambulatory Visit
Admission: RE | Admit: 2021-05-05 | Discharge: 2021-05-05 | Disposition: A | Discharge status: Home / Self Care | Payer: Medicare Other | Source: Ambulatory Visit | Attending: Family Medicine | Admitting: Family Medicine

## 2021-05-05 DIAGNOSIS — R928 Other abnormal and inconclusive findings on diagnostic imaging of breast: Secondary | ICD-10-CM | POA: Diagnosis not present

## 2021-05-05 DIAGNOSIS — N6011 Diffuse cystic mastopathy of right breast: Secondary | ICD-10-CM

## 2021-05-05 DIAGNOSIS — N6489 Other specified disorders of breast: Secondary | ICD-10-CM | POA: Diagnosis not present

## 2021-05-05 DIAGNOSIS — R2232 Localized swelling, mass and lump, left upper limb: Secondary | ICD-10-CM

## 2021-05-05 DIAGNOSIS — N6012 Diffuse cystic mastopathy of left breast: Secondary | ICD-10-CM

## 2021-05-05 DIAGNOSIS — L0291 Cutaneous abscess, unspecified: Secondary | ICD-10-CM

## 2021-05-08 DIAGNOSIS — I951 Orthostatic hypotension: Secondary | ICD-10-CM | POA: Diagnosis not present

## 2021-05-08 DIAGNOSIS — R42 Dizziness and giddiness: Secondary | ICD-10-CM | POA: Diagnosis not present

## 2021-05-08 DIAGNOSIS — H8113 Benign paroxysmal vertigo, bilateral: Secondary | ICD-10-CM | POA: Diagnosis not present

## 2021-05-09 ENCOUNTER — Encounter: Payer: Self-pay | Admitting: *Deleted

## 2021-05-10 NOTE — Telephone Encounter (Signed)
Appeal letter written, but we need patient signature to give consent.  Patient will be by on Friday for [place] to send letter.

## 2021-05-11 DIAGNOSIS — H8113 Benign paroxysmal vertigo, bilateral: Secondary | ICD-10-CM | POA: Diagnosis not present

## 2021-05-11 DIAGNOSIS — R42 Dizziness and giddiness: Secondary | ICD-10-CM | POA: Diagnosis not present

## 2021-05-11 DIAGNOSIS — I951 Orthostatic hypotension: Secondary | ICD-10-CM | POA: Diagnosis not present

## 2021-05-16 DIAGNOSIS — R42 Dizziness and giddiness: Secondary | ICD-10-CM | POA: Diagnosis not present

## 2021-05-16 DIAGNOSIS — I951 Orthostatic hypotension: Secondary | ICD-10-CM | POA: Diagnosis not present

## 2021-05-16 DIAGNOSIS — H8113 Benign paroxysmal vertigo, bilateral: Secondary | ICD-10-CM | POA: Diagnosis not present

## 2021-05-22 DIAGNOSIS — R42 Dizziness and giddiness: Secondary | ICD-10-CM | POA: Diagnosis not present

## 2021-05-22 DIAGNOSIS — I951 Orthostatic hypotension: Secondary | ICD-10-CM | POA: Diagnosis not present

## 2021-05-22 DIAGNOSIS — H8113 Benign paroxysmal vertigo, bilateral: Secondary | ICD-10-CM | POA: Diagnosis not present

## 2021-05-26 NOTE — Telephone Encounter (Signed)
Uloric was denied.  Left detailed message on machine and no answer at home number.

## 2021-06-01 ENCOUNTER — Ambulatory Visit: Payer: Medicare Other | Admitting: Family Medicine

## 2021-06-16 ENCOUNTER — Encounter: Payer: Self-pay | Admitting: Physician Assistant

## 2021-06-16 ENCOUNTER — Ambulatory Visit (INDEPENDENT_AMBULATORY_CARE_PROVIDER_SITE_OTHER): Payer: Medicare Other | Admitting: Internal Medicine

## 2021-06-16 ENCOUNTER — Ambulatory Visit (INDEPENDENT_AMBULATORY_CARE_PROVIDER_SITE_OTHER): Payer: Medicare Other | Admitting: Physician Assistant

## 2021-06-16 ENCOUNTER — Encounter: Payer: Self-pay | Admitting: Internal Medicine

## 2021-06-16 ENCOUNTER — Other Ambulatory Visit: Payer: Self-pay

## 2021-06-16 VITALS — BP 120/74 | HR 74 | Temp 98.4°F | Resp 18 | Ht 61.0 in | Wt 143.8 lb

## 2021-06-16 VITALS — BP 115/72 | HR 73 | Ht 61.0 in | Wt 143.8 lb

## 2021-06-16 DIAGNOSIS — L732 Hidradenitis suppurativa: Secondary | ICD-10-CM

## 2021-06-16 DIAGNOSIS — R42 Dizziness and giddiness: Secondary | ICD-10-CM

## 2021-06-16 DIAGNOSIS — L0291 Cutaneous abscess, unspecified: Secondary | ICD-10-CM | POA: Diagnosis not present

## 2021-06-16 DIAGNOSIS — I6523 Occlusion and stenosis of bilateral carotid arteries: Secondary | ICD-10-CM

## 2021-06-16 DIAGNOSIS — R0600 Dyspnea, unspecified: Secondary | ICD-10-CM

## 2021-06-16 DIAGNOSIS — R002 Palpitations: Secondary | ICD-10-CM

## 2021-06-16 DIAGNOSIS — I10 Essential (primary) hypertension: Secondary | ICD-10-CM

## 2021-06-16 MED ORDER — LOSARTAN POTASSIUM 25 MG PO TABS: 25.0000 mg | ORAL_TABLET | Freq: Every day | ORAL | 3 refills | Status: DC

## 2021-06-16 MED ORDER — DOXYCYCLINE HYCLATE 100 MG PO TABS: 100.0000 mg | ORAL_TABLET | Freq: Two times a day (BID) | ORAL | 0 refills | Status: DC

## 2021-06-16 NOTE — Progress Notes (Signed)
Cardiology Office Note:    Date:  06/18/2021   ID:  Christina Mckinney, DOB July 22, 1932, MRN [id number]  PCP:  Mosie Lukes, MD   [facility] Providers Cardiologist:  Evalina Field, MD     Referring MD: Mosie Lukes, MD   Chief Complaint  Patient presents with   Follow-up    Seen for Dr. Audie Box    History of Present Illness:    Christina Mckinney is a 87 y.o. female with a hx of carotid artery stenosis, hypertension, hyperlipidemia intolerant of statins and anxiety.  He previously saw Dr. Audie Box and wore a heart monitor for palpitation.  The result was normal.  Due to dizziness and borderline low blood pressure, losartan was stopped.  He was seen by Coletta Memos, NP on 02/22/2021 for elevated blood pressure after coming off of Cozaar.  Propranolol was increased to help cover her blood pressure and palpitation, she was also restarted on 25 mg daily of losartan.    I last saw the patient in May 2022, at which time her blood pressure has been very well controlled after self-increasing losartan to 50 mg daily.  She was having some chills and green sinus drainage again.  Her previous ENT doctor prescribed her Levaquin last year which really helped her sinus reinfection.  I prescribed another course of Levaquin.  Recent blood work obtained by PCP showed borderline elevated TSH and elevated uric acid level.  Patient presents today for follow-up.  In the past few weeks, she has been having worsening fatigue and shortness of breath.  On examination, she has multiple boils under the left armpit, consistent with hidradenitis suppurativa.  She is going to see her PCP this afternoon.  She complains of nightly episode of palpitation every time she would lay down.  Her dizziness is occurring on a daily basis.  I recommended reduce losartan to 25 mg daily.  She also complained of worsening shortness of breath and is worried she might have a new blockage.  She says she underwent a cardiac catheterization at  [facility] in [place] about 15 years ago.  She did not receive any stents.  She was told she might have coronary artery disease however did not mention what percentage of the coronary artery disease.  I recommended monitoring for her palpitation.  Her palpitation seems to be improving after she was switched back from generic medication to brand name medication.  If her symptoms still persist in the future, I can order a 2-week heart monitor.  As far as her concern of significant coronary artery disease, I will order a nuclear stress test.  Past Medical History:  Diagnosis Date   Anemia, unspecified    Anxiety and depression 09/18/2015   Anxiety state, unspecified    Chest pain    Chronic systolic CHF (congestive heart failure) ([place]) 08/12/2015   Depression with anxiety 06/02/2007   Qualifier: Diagnosis of  By: Wynona Luna    Depressive disorder, not elsewhere classified    Diverticulitis    DVT (deep venous thrombosis) ([place])    Dyspnea 07/22/2015   Edema 06/12/2014   Essential and other specified forms of tremor    GERD (gastroesophageal reflux disease)    Gout, unspecified    Internal hemorrhoids without mention of complication    Macular degeneration 08/07/2015   Osteoarthritis    Other abnormal glucose    Other and unspecified hyperlipidemia    Personal history of colonic polyps  Personal history of peptic ulcer disease    Pulmonary embolism ([place])    Unspecified disorder of bladder    Unspecified essential hypertension    Unspecified hypothyroidism    Varicose veins     Past Surgical History:  Procedure Laterality Date   ABDOMINAL HYSTERECTOMY     CHOLECYSTECTOMY     ENDOVENOUS ABLATION SAPHENOUS VEIN W/ LASER  09-11-2012   left greater saphenous vein   by Curt Jews MD   EYE SURGERY     KNEE ARTHROSCOPY     right    NASAL SEPTUM SURGERY     SHOULDER SURGERY     TOTAL KNEE ARTHROPLASTY  09/2013   Right    Current Medications: Current Meds  Medication Sig    albuterol (PROAIR HFA) 108 (90 Base) MCG/ACT inhaler Inhale 2 puffs into the lungs every 6 (six) hours as needed for wheezing or shortness of breath.   aspirin 81 MG tablet Take 1 tablet (81 mg total) by mouth daily.   co-enzyme Q-10 30 MG capsule Take 30 mg by mouth daily.   fexofenadine (ALLEGRA ALLERGY) 180 MG tablet Take 1 tablet (180 mg total) by mouth daily.   fluticasone (FLONASE) 50 MCG/ACT nasal spray Place 2 sprays into both nostrils daily. USE 2 SPRAYS IN EACH NOSTRIL DAILY AS NEEDED FOR ALLERGIES OR RHINITIS   fluticasone (FLOVENT HFA) 110 MCG/ACT inhaler Inhale 2 puffs into the lungs 2 (two) times daily.   furosemide (LASIX) 40 MG tablet TAKE 1 TABLET DAILY   gabapentin (NEURONTIN) 300 MG capsule Take 1 capsule (300 mg total) by mouth at bedtime.   guaiFENesin (MUCINEX) 600 MG 12 hr tablet Take 1,200 mg by mouth 2 (two) times daily as needed for to loosen phlegm.    ipratropium-albuterol (DUONEB) 0.5-2.5 (3) MG/3ML SOLN USE THREE TIMES A DAY FOR NEXT 3 DAYS AND THEN AS NEEDED FOR WHEEZING.   pantoprazole (PROTONIX) 40 MG tablet Take 1 tablet (40 mg total) by mouth 2 (two) times daily before a meal.   propranolol ER (INDERAL LA) 80 MG 24 hr capsule Take 1 capsule (80 mg total) by mouth daily.   sucralfate (CARAFATE) 1 g tablet TAKE 1 TABLET IN THE MORNING, AT NOON, IN THE EVENING AND AT BEDTIME   SYNTHROID 50 MCG tablet Take 1 tablet (50 mcg total) by mouth daily before breakfast.   ULORIC 40 MG tablet Take 1 tablet (40 mg total) by mouth daily.   [DISCONTINUED] losartan (COZAAR) 50 MG tablet Take 1 tablet (50 mg total) by mouth daily.     Allergies:   Simvastatin, Crestor [rosuvastatin], Lipitor [atorvastatin], Red dye, and Statins   Social History   Socioeconomic History   Marital status: Widowed    Spouse name: Not on file   Number of children: 3   Years of education: Not on file   Highest education level: Not on file  Occupational History   Occupation: Retired    Tobacco Use   Smoking status: Former    Years: 40.00    Types: Cigarettes    Quit date: 11/12/1988    Years since quitting: 32.6   Smokeless tobacco: Never  Substance and Sexual Activity   Alcohol use: Yes    Alcohol/week: 4.0 standard drinks    Types: 4 Standard drinks or equivalent per week    Comment: occasional wine    Drug use: No   Sexual activity: Never    Comment: lives alone, no dietary restrictions, widowed  Other Topics Concern  Not on file  Social History Narrative   Not on file   Social Determinants of Health   Financial Resource Strain: Not on file  Food Insecurity: Not on file  Transportation Needs: Not on file  Physical Activity: Not on file  Stress: Not on file  Social Connections: Not on file     Family History: The patient's family history includes Aneurysm in her daughter; Cancer in her sister; Diabetes in her daughter; Heart attack in her father; Heart disease in her brother, daughter, father, and sister; Hip fracture in her mother; Hyperlipidemia in her daughter and father; Hypertension in her daughter; Other in her mother. There is no history of Colon cancer.  ROS:   Please see the history of present illness.     All other systems reviewed and are negative.  EKGs/Labs/Other Studies Reviewed:    The following studies were reviewed today:  Echo 05/02/2017 LV EF: 55% -   60%   -------------------------------------------------------------------  Indications:      Dyspnea 786.09.   -------------------------------------------------------------------  History:   PMH:  Hypothyroidism.  Risk factors:  Hypertension.  Diabetes mellitus. Dyslipidemia.   -------------------------------------------------------------------  Study Conclusions   - Left ventricle: The cavity size was normal. Wall thickness was    increased in a pattern of mild LVH. Systolic function was normal.    The estimated ejection fraction was in the range of 55% to 60%.    Wall  motion was normal; there were no regional wall motion    abnormalities. Doppler parameters are consistent with abnormal    left ventricular relaxation (grade 1 diastolic dysfunction).  - Aortic valve: There was no stenosis.  - Mitral valve: Mildly calcified annulus. There was no significant    regurgitation.  - Left atrium: The atrium was mildly dilated.  - Right ventricle: The cavity size was normal. Systolic function    was normal.  - Pulmonary arteries: PA peak pressure: 29 mm Hg (S).  - Systemic veins: IVC measured 2.2 cm with normal respirophasic    variation, suggesting RA pressure 8 mmHg.   Impressions:   - Nomral LV size with mild LV hypertrophy. EF 55-60%. Normal RV    size and systolic function. No significant valvular    abnormalities.   EKG:  EKG is ordered today.  The ekg ordered today demonstrates normal sinus rhythm, right bundle branch block  Recent Labs: 04/25/2021: ALT 5; BUN 21; Creatinine, Ser 1.28; Hemoglobin 12.0; Platelets 182.0; Potassium 4.7; Sodium 141; TSH 4.99  Recent Lipid Panel    Component Value Date/Time   CHOL 176 04/25/2021 1154   TRIG 133.0 04/25/2021 1154   HDL 46.40 04/25/2021 1154   CHOLHDL 4 04/25/2021 1154   VLDL 26.6 04/25/2021 1154   LDLCALC 103 (H) 04/25/2021 1154   LDLCALC 116 (H) 09/13/2020 1044   LDLDIRECT 83.0 05/21/2019 1511     Risk Assessment/Calculations:           Physical Exam:    VS:  BP 115/72   Pulse 73   Ht '5\' 1"'$  (1.549 m)   Wt 143 lb 12.8 oz (65.2 kg)   SpO2 94%   BMI 27.17 kg/m     Wt Readings from Last 3 Encounters:  06/16/21 143 lb 12 oz (65.2 kg)  06/16/21 143 lb 12.8 oz (65.2 kg)  03/27/21 152 lb (68.9 kg)     GEN:  Well nourished, well developed in no acute distress HEENT: Normal NECK: No JVD; No carotid bruits LYMPHATICS: No lymphadenopathy CARDIAC:  RRR, no murmurs, rubs, gallops RESPIRATORY:  Clear to auscultation without rales, wheezing or rhonchi  ABDOMEN: Soft, non-tender,  non-distended MUSCULOSKELETAL:  No edema; No deformity  SKIN: Warm and dry NEUROLOGIC:  Alert and oriented x 3 PSYCHIATRIC:  Normal affect   ASSESSMENT:    1. Palpitations   2. Dyspnea, unspecified type   3. Dizziness   4. Essential hypertension   5. Hyperlipidemia LDL goal <100   6. Hidradenitis suppurativa    PLAN:    In order of problems listed above:  Palpitation: Interestingly her palpitation only occurs when she is laying down.  Previous heart monitor did not reveal any obvious arrhythmia.  She attributed a lot of recent symptoms to generic medication she has received, she has since contacted her PCP and was able to get some brand-name medication.  I recommended continued observation, if symptom persist or start increasing, we can run a longer heart monitor.  Dyspnea: Symptoms occur mainly on exertion.  Patient is concerned about blockage in the heart.  She does not previously carry a diagnosis of CAD, however according to the patient, she underwent cardiac catheterization at [facility] in [place] 15 years ago, no stent was placed.  I do not have the record.  I will order a Myoview  Dizziness: This is a chronic issue for the patient.  Reduce losartan to 25 mg daily.  Symptom does not appears to be orthostatic  Hypertension: Blood pressure stable on current therapy, will reduce losartan to 25 mg daily given persistent dizziness  Hidradenitis suppurativa: She has multiple inflamed red boils under the left armpit.  I recommended she see her PCP.    Shared Decision Making/Informed Consent The risks [chest pain, shortness of breath, cardiac arrhythmias, dizziness, blood pressure fluctuations, myocardial infarction, stroke/transient ischemic attack, nausea, vomiting, allergic reaction, radiation exposure, metallic taste sensation and life-threatening complications (estimated to be 1 in 10,000)], benefits (risk stratification, diagnosing coronary artery disease, treatment  guidance) and alternatives of a nuclear stress test were discussed in detail with Ms. Blattel and she agrees to proceed.    Medication Adjustments/Labs and Tests Ordered: Current medicines are reviewed at length with the patient today.  Concerns regarding medicines are outlined above.  Orders Placed This Encounter  Procedures   MYOCARDIAL PERFUSION IMAGING   EKG 12-Lead   Meds ordered this encounter  Medications   losartan (COZAAR) 25 MG tablet    Sig: Take 1 tablet (25 mg total) by mouth daily.    Dispense:  90 tablet    Refill:  3    Dose change new Rx    Patient Instructions  Medication Instructions:  DECREASE Losartan to 25 mg daily *If you need a refill on your cardiac medications before your next appointment, please call your pharmacy*  Lab Work: NONE ordered at this time of appointment   If you have labs (blood work) drawn today and your tests are completely normal, you will receive your results only by: MyChart Message (if you have MyChart) OR A paper copy in the mail If you have any lab test that is abnormal or we need to change your treatment, we will call you to review the results.   Testing/Procedures: Your physician has requested that you have a lexiscan myoview. For further information please visit [email or web address]. Please follow instruction sheet, as given.  Please schedule for 2-3 weeks at [facility] office   Follow-Up: At [facility], you and your health needs are our priority.  As part of our  continuing mission to provide you with exceptional heart care, we have created designated Provider Care Teams.  These Care Teams include your primary Cardiologist (physician) and Advanced Practice Providers (APPs -  Physician Assistants and Nurse Practitioners) who all work together to provide you with the care you need, when you need it.  We recommend signing up for the patient portal called "MyChart".  Sign up information is provided on this After Visit Summary.   MyChart is used to connect with patients for Virtual Visits (Telemedicine).  Patients are able to view lab/test results, encounter notes, upcoming appointments, etc.  Non-urgent messages can be sent to your provider as well.   To learn more about what you can do with MyChart, go to [email or web address].    Your next appointment:   3 month(s)  The format for your next appointment:   In Person  Provider:   Eleonore Chiquito, MD or Almyra Deforest, PA-C  Other Instructions  Talk to your PCP this afternoon about Hidradenitis Suppurativa    Signed, Almyra Deforest, PA  06/18/2021 11:56 PM    [place]

## 2021-06-16 NOTE — Patient Instructions (Addendum)
Keep the area clean and dry  You can take a shower if you like to  If the area is bleeding, apply pressure with a gauze  Take doxycycline, an antibiotic, for 1 week.  If you have fever, chills or the area is getting worse: Call immediately  Please make an appointment to see your primary doctor next week for a checkup

## 2021-06-16 NOTE — Patient Instructions (Addendum)
Medication Instructions:  DECREASE Losartan to 25 mg daily *If you need a refill on your cardiac medications before your next appointment, please call your pharmacy*  Lab Work: NONE ordered at this time of appointment   If you have labs (blood work) drawn today and your tests are completely normal, you will receive your results only by: [place] (if you have MyChart) OR A paper copy in the mail If you have any lab test that is abnormal or we need to change your treatment, we will call you to review the results.   Testing/Procedures: Your physician has requested that you have a lexiscan myoview. For further information please visit [email or web address]. Please follow instruction sheet, as given.  Please schedule for 2-3 weeks at [facility] office   Follow-Up: At [facility], you and your health needs are our priority.  As part of our continuing mission to provide you with exceptional heart care, we have created designated Provider Care Teams.  These Care Teams include your primary Cardiologist (physician) and Advanced Practice Providers (APPs -  Physician Assistants and Nurse Practitioners) who all work together to provide you with the care you need, when you need it.  We recommend signing up for the patient portal called "MyChart".  Sign up information is provided on this After Visit Summary.  MyChart is used to connect with patients for Virtual Visits (Telemedicine).  Patients are able to view lab/test results, encounter notes, upcoming appointments, etc.  Non-urgent messages can be sent to your provider as well.   To learn more about what you can do with MyChart, go to [email or web address].    Your next appointment:   3 month(s)  The format for your next appointment:   In Person  Provider:   Eleonore Chiquito, MD or Almyra Deforest, PA-C  Other Instructions  Talk to your PCP this afternoon about Hidradenitis Suppurativa

## 2021-06-16 NOTE — Progress Notes (Signed)
Subjective:    Patient ID: Christina Mckinney, female    DOB: August 22, 1932, 87 y.o.   MRN: [id number]  DOS:  06/16/2021 Type of visit - description: Acute Had a boil at the left axillary area in June, was prescribed Bactrim. Issue never completely resolved. Has noted boils again for the last 5 days. Denies fever chills + Pain No discharge.  Also complaining of small boil on and off at the nostrils for a while.  Review of Systems See above   Past Medical History:  Diagnosis Date   Anemia, unspecified    Anxiety and depression 09/18/2015   Anxiety state, unspecified    Chest pain    Chronic systolic CHF (congestive heart failure) ([place]) 08/12/2015   Depression with anxiety 06/02/2007   Qualifier: Diagnosis of  By: Wynona Luna    Depressive disorder, not elsewhere classified    Diverticulitis    DVT (deep venous thrombosis) ([place])    Dyspnea 07/22/2015   Edema 06/12/2014   Essential and other specified forms of tremor    GERD (gastroesophageal reflux disease)    Gout, unspecified    Internal hemorrhoids without mention of complication    Macular degeneration 08/07/2015   Osteoarthritis    Other abnormal glucose    Other and unspecified hyperlipidemia    Personal history of colonic polyps    Personal history of peptic ulcer disease    Pulmonary embolism ([place])    Unspecified disorder of bladder    Unspecified essential hypertension    Unspecified hypothyroidism    Varicose veins     Past Surgical History:  Procedure Laterality Date   ABDOMINAL HYSTERECTOMY     CHOLECYSTECTOMY     ENDOVENOUS ABLATION SAPHENOUS VEIN W/ LASER  09-11-2012   left greater saphenous vein   by Curt Jews MD   EYE SURGERY     KNEE ARTHROSCOPY     right    NASAL SEPTUM SURGERY     SHOULDER SURGERY     TOTAL KNEE ARTHROPLASTY  09/2013   Right    Allergies as of 06/16/2021       Reactions   Simvastatin Other (See Comments)   Hip pain   Crestor [rosuvastatin]    Left hip pain   Lipitor  [atorvastatin]    Pain in hips   Red Dye    Hip pain   Statins Other (See Comments)   Muscle aches, could not walk        Medication List        Accurate as of June 16, 2021  3:55 PM. If you have any questions, ask your nurse or doctor.          albuterol 108 (90 Base) MCG/ACT inhaler Commonly known as: ProAir HFA Inhale 2 puffs into the lungs every 6 (six) hours as needed for wheezing or shortness of breath.   aspirin 81 MG tablet Take 1 tablet (81 mg total) by mouth daily.   co-enzyme Q-10 30 MG capsule Take 30 mg by mouth daily.   fexofenadine 180 MG tablet Commonly known as: Allegra Allergy Take 1 tablet (180 mg total) by mouth daily.   Flovent HFA 110 MCG/ACT inhaler Generic drug: fluticasone Inhale 2 puffs into the lungs 2 (two) times daily.   fluticasone 50 MCG/ACT nasal spray Commonly known as: FLONASE Place 2 sprays into both nostrils daily. USE 2 SPRAYS IN EACH NOSTRIL DAILY AS NEEDED FOR ALLERGIES OR RHINITIS   furosemide 40 MG tablet Commonly known  as: LASIX TAKE 1 TABLET DAILY   gabapentin 300 MG capsule Commonly known as: Neurontin Take 1 capsule (300 mg total) by mouth at bedtime.   guaiFENesin 600 MG 12 hr tablet Commonly known as: MUCINEX Take 1,200 mg by mouth 2 (two) times daily as needed for to loosen phlegm.   ipratropium-albuterol 0.5-2.5 (3) MG/3ML Soln Commonly known as: DUONEB USE THREE TIMES A DAY FOR NEXT 3 DAYS AND THEN AS NEEDED FOR WHEEZING.   losartan 25 MG tablet Commonly known as: COZAAR Take 1 tablet (25 mg total) by mouth daily. What changed:  medication strength how much to take Changed by: Almyra Deforest, PA   pantoprazole 40 MG tablet Commonly known as: Protonix Take 1 tablet (40 mg total) by mouth 2 (two) times daily before a meal.   propranolol ER 80 MG 24 hr capsule Commonly known as: INDERAL LA Take 1 capsule (80 mg total) by mouth daily.   sucralfate 1 g tablet Commonly known as: CARAFATE TAKE 1 TABLET  IN THE MORNING, AT NOON, IN THE EVENING AND AT BEDTIME   sulfamethoxazole-trimethoprim 800-160 MG tablet Commonly known as: BACTRIM DS Take 1 tablet by mouth 2 (two) times daily.   Synthroid 50 MCG tablet Generic drug: levothyroxine Take 1 tablet (50 mcg total) by mouth daily before breakfast.   Uloric 40 MG tablet Generic drug: febuxostat Take 1 tablet (40 mg total) by mouth daily.           Objective:   Physical Exam BP 120/74 (BP Location: Left Arm, Patient Position: Sitting, Cuff Size: Small)   Pulse 74   Temp 98.4 F (36.9 C) (Oral)   Resp 18   Ht '5\' 1"'$  (1.549 m)   Wt 143 lb 12 oz (65.2 kg)   SpO2 93%   BMI 27.16 kg/m  General:   Well developed, NAD, BMI noted. HEENT:  Normocephalic . Face symmetric, atraumatic. Nose: No obvious bolus today. R axillary area: Normal L axillary area: Has 4 boils, the one in the center is about 2 x 1 cm in size, fluctuant, TTP Lower extremities: no pretibial edema bilaterally  Skin: Not pale. Not jaundice Neurologic:  alert & oriented X3.  Speech normal, gait appropriate for age and unassisted Psych--  Cognition and judgment appear intact.  Cooperative with normal attention span and concentration.  Behavior appropriate. No anxious or depressed appearing.      Assessment     87 year old female, PMH includes thyroid disease, HTN, sleep apnea, DJD, carotid artery stenosis, among other issues presents with:  Boil, axillary area: Area was incised and drained, culture sent, start doxycycline. See AVS Will send mupirocin to be used at the nose twice daily for 7 days.  She has on and off boils there.  Procedure note: In a sterile fashion and under local anesthesia with about three quarters of mL of lidocaine 2% without I lancet the boil at the L axillary area.  I immediately got purulent material, some blood, no fatty material. Patient tolerated well, bleeding was easily controlled.  Area was covered with a gauze. Cx sent     This visit occurred during the SARS-CoV-2 public health emergency.  Safety protocols were in place, including screening questions prior to the visit, additional usage of staff PPE, and extensive cleaning of exam room while observing appropriate contact time as indicated for disinfecting solutions.

## 2021-06-18 ENCOUNTER — Encounter: Payer: Self-pay | Admitting: Physician Assistant

## 2021-06-18 ENCOUNTER — Encounter: Payer: Self-pay | Admitting: Internal Medicine

## 2021-06-18 MED ORDER — MUPIROCIN CALCIUM 2 % EX CREA: 1.0000 "application " | TOPICAL_CREAM | Freq: Two times a day (BID) | CUTANEOUS | 0 refills | Status: DC

## 2021-06-19 LAB — WOUND CULTURE
MICRO NUMBER:: 12207446
SPECIMEN QUALITY:: ADEQUATE

## 2021-06-23 ENCOUNTER — Other Ambulatory Visit: Payer: Self-pay

## 2021-06-23 ENCOUNTER — Encounter: Payer: Self-pay | Admitting: Internal Medicine

## 2021-06-23 ENCOUNTER — Ambulatory Visit (INDEPENDENT_AMBULATORY_CARE_PROVIDER_SITE_OTHER): Payer: Medicare Other | Admitting: Internal Medicine

## 2021-06-23 VITALS — BP 142/76 | HR 72 | Temp 98.4°F | Resp 16 | Ht 61.0 in | Wt 143.0 lb

## 2021-06-23 DIAGNOSIS — L0291 Cutaneous abscess, unspecified: Secondary | ICD-10-CM

## 2021-06-23 DIAGNOSIS — I6523 Occlusion and stenosis of bilateral carotid arteries: Secondary | ICD-10-CM

## 2021-06-23 NOTE — Progress Notes (Signed)
Subjective:    Patient ID: Christina Mckinney, female    DOB: 05-30-1932, 87 y.o.   MRN: [id number]  DOS:  06/23/2021 Type of visit - description: Follow-up from last visit  At the last visit, had a incision and drainage of a boil. Was prescribed doxycycline which she took until today. Overall feels better. Denies fever chills No nausea, vomiting, diarrhea.  Review of Systems See above   Past Medical History:  Diagnosis Date   Anemia, unspecified    Anxiety and depression 09/18/2015   Anxiety state, unspecified    Chest pain    Chronic systolic CHF (congestive heart failure) ([place]) 08/12/2015   Depression with anxiety 06/02/2007   Qualifier: Diagnosis of  By: Wynona Luna    Depressive disorder, not elsewhere classified    Diverticulitis    DVT (deep venous thrombosis) ([place])    Dyspnea 07/22/2015   Edema 06/12/2014   Essential and other specified forms of tremor    GERD (gastroesophageal reflux disease)    Gout, unspecified    Internal hemorrhoids without mention of complication    Macular degeneration 08/07/2015   Osteoarthritis    Other abnormal glucose    Other and unspecified hyperlipidemia    Personal history of colonic polyps    Personal history of peptic ulcer disease    Pulmonary embolism ([place])    Unspecified disorder of bladder    Unspecified essential hypertension    Unspecified hypothyroidism    Varicose veins     Past Surgical History:  Procedure Laterality Date   ABDOMINAL HYSTERECTOMY     CHOLECYSTECTOMY     ENDOVENOUS ABLATION SAPHENOUS VEIN W/ LASER  09-11-2012   left greater saphenous vein   by Curt Jews MD   EYE SURGERY     KNEE ARTHROSCOPY     right    NASAL SEPTUM SURGERY     SHOULDER SURGERY     TOTAL KNEE ARTHROPLASTY  09/2013   Right    Allergies as of 06/23/2021       Reactions   Simvastatin Other (See Comments)   Hip pain   Crestor [rosuvastatin]    Left hip pain   Lipitor [atorvastatin]    Pain in hips   Red Dye    Hip pain    Statins Other (See Comments)   Muscle aches, could not walk        Medication List        Accurate as of June 23, 2021 11:59 PM. If you have any questions, ask your nurse or doctor.          STOP taking these medications    doxycycline 100 MG tablet Commonly known as: VIBRA-TABS Stopped by: Kathlene November, MD       TAKE these medications    albuterol 108 (90 Base) MCG/ACT inhaler Commonly known as: ProAir HFA Inhale 2 puffs into the lungs every 6 (six) hours as needed for wheezing or shortness of breath.   aspirin 81 MG tablet Take 1 tablet (81 mg total) by mouth daily.   co-enzyme Q-10 30 MG capsule Take 30 mg by mouth daily.   fexofenadine 180 MG tablet Commonly known as: Allegra Allergy Take 1 tablet (180 mg total) by mouth daily.   Flovent HFA 110 MCG/ACT inhaler Generic drug: fluticasone Inhale 2 puffs into the lungs 2 (two) times daily.   fluticasone 50 MCG/ACT nasal spray Commonly known as: FLONASE Place 2 sprays into both nostrils daily. USE 2 SPRAYS IN  EACH NOSTRIL DAILY AS NEEDED FOR ALLERGIES OR RHINITIS   furosemide 40 MG tablet Commonly known as: LASIX TAKE 1 TABLET DAILY   gabapentin 300 MG capsule Commonly known as: Neurontin Take 1 capsule (300 mg total) by mouth at bedtime.   guaiFENesin 600 MG 12 hr tablet Commonly known as: MUCINEX Take 1,200 mg by mouth 2 (two) times daily as needed for to loosen phlegm.   ipratropium-albuterol 0.5-2.5 (3) MG/3ML Soln Commonly known as: DUONEB USE THREE TIMES A DAY FOR NEXT 3 DAYS AND THEN AS NEEDED FOR WHEEZING.   losartan 25 MG tablet Commonly known as: COZAAR Take 1 tablet (25 mg total) by mouth daily.   mupirocin cream 2 % Commonly known as: BACTROBAN Apply 1 application topically 2 (two) times daily. Apply to the nose twice a day x 1 week   pantoprazole 40 MG tablet Commonly known as: Protonix Take 1 tablet (40 mg total) by mouth 2 (two) times daily before a meal.   propranolol ER 80 MG  24 hr capsule Commonly known as: INDERAL LA Take 1 capsule (80 mg total) by mouth daily.   sucralfate 1 g tablet Commonly known as: CARAFATE TAKE 1 TABLET IN THE MORNING, AT NOON, IN THE EVENING AND AT BEDTIME   Synthroid 50 MCG tablet Generic drug: levothyroxine Take 1 tablet (50 mcg total) by mouth daily before breakfast.   Uloric 40 MG tablet Generic drug: febuxostat Take 1 tablet (40 mg total) by mouth daily.           Objective:   Physical Exam BP (!) 142/76 (BP Location: Left Arm, Patient Position: Sitting, Cuff Size: Small)   Pulse 72   Temp 98.4 F (36.9 C) (Oral)   Resp 16   Ht '5\' 1"'$  (1.549 m)   Wt 143 lb (64.9 kg)   SpO2 95%   BMI 27.02 kg/m  General:   Well developed, NAD, BMI noted. HEENT:  Normocephalic . Face symmetric, atraumatic Skin: Left axillary area, currently with no abscess, redness, fluctuance.  She does have 4 or 5 subcu indurations without openings. Neurologic:  alert & oriented X3.  Speech normal, gait appropriate for age and unassisted Psych--  Cognition and judgment appear intact.  Cooperative with normal attention span and concentration.  Behavior appropriate. No anxious or depressed appearing.      Assessment     87 year old female, PMH includes thyroid disease, HTN, sleep apnea, DJD, carotid artery stenosis, among other issues presents with:  Boil, axillary area: Since the last visit, culture showed MRSA, finishing doxycycline today. No fever chills. Area looks much improved. I also sent mupirocin to be used at the nose. At this point I do not think she needs further systemic antibiotics,    This visit occurred during the SARS-CoV-2 public health emergency.  Safety protocols were in place, including screening questions prior to the visit, additional usage of staff PPE, and extensive cleaning of exam room while observing appropriate contact time as indicated for disinfecting solutions.

## 2021-06-27 ENCOUNTER — Telehealth (HOSPITAL_COMMUNITY): Payer: Self-pay | Admitting: *Deleted

## 2021-06-27 NOTE — Telephone Encounter (Signed)
Patient given detailed instructions per Myocardial Perfusion Study Information Sheet for the test on 07/03/21 at 10:45. Patient notified to arrive 15 minutes early and that it is imperative to arrive on time for appointment to keep from having the test rescheduled.  If you need to cancel or reschedule your appointment, please call the office within 24 hours of your appointment. . Patient verbalized understanding.Christina Mckinney

## 2021-07-03 ENCOUNTER — Other Ambulatory Visit: Payer: Self-pay

## 2021-07-03 ENCOUNTER — Ambulatory Visit (HOSPITAL_COMMUNITY): Payer: Medicare Other | Attending: Physician Assistant

## 2021-07-03 DIAGNOSIS — R0600 Dyspnea, unspecified: Secondary | ICD-10-CM | POA: Insufficient documentation

## 2021-07-03 LAB — MYOCARDIAL PERFUSION IMAGING
Base ST Depression (mm): 0 mm
LV dias vol: 38 mL (ref 46–106)
LV sys vol: 4 mL
MPHR: 133 {beats}/min
Nuc Stress EF: 89 %
Peak HR: 89 {beats}/min
Percent HR: 66.9 %
Rest HR: 71 {beats}/min
Rest Nuclear Isotope Dose: 10.9 mCi
SDS: 0
SRS: 0
SSS: 0
ST Depression (mm): 0 mm
Stress Nuclear Isotope Dose: 31.1 mCi
TID: 1.13

## 2021-07-03 MED ORDER — REGADENOSON 0.4 MG/5ML IV SOLN
0.4000 mg | Freq: Once | INTRAVENOUS | Status: AC
  Administered 2021-07-03: 0.4 mg via INTRAVENOUS

## 2021-07-03 MED ORDER — TECHNETIUM TC 99M TETROFOSMIN IV KIT
10.9000 | PACK | Freq: Once | INTRAVENOUS | Status: AC | PRN
  Administered 2021-07-03: 10.9 via INTRAVENOUS
  Filled 2021-07-03: qty 11

## 2021-07-03 MED ORDER — TECHNETIUM TC 99M TETROFOSMIN IV KIT
31.1000 | PACK | Freq: Once | INTRAVENOUS | Status: AC | PRN
  Administered 2021-07-03: 31.1 via INTRAVENOUS
  Filled 2021-07-03: qty 32

## 2021-08-10 ENCOUNTER — Encounter: Payer: Self-pay | Admitting: Family Medicine

## 2021-08-10 ENCOUNTER — Ambulatory Visit (INDEPENDENT_AMBULATORY_CARE_PROVIDER_SITE_OTHER): Payer: Medicare Other | Admitting: Family Medicine

## 2021-08-10 ENCOUNTER — Other Ambulatory Visit: Payer: Self-pay

## 2021-08-10 VITALS — BP 108/66 | HR 81 | Temp 97.8°F | Resp 16 | Wt 147.0 lb

## 2021-08-10 DIAGNOSIS — R7989 Other specified abnormal findings of blood chemistry: Secondary | ICD-10-CM | POA: Diagnosis not present

## 2021-08-10 DIAGNOSIS — E039 Hypothyroidism, unspecified: Secondary | ICD-10-CM | POA: Diagnosis not present

## 2021-08-10 DIAGNOSIS — M109 Gout, unspecified: Secondary | ICD-10-CM | POA: Diagnosis not present

## 2021-08-10 DIAGNOSIS — E785 Hyperlipidemia, unspecified: Secondary | ICD-10-CM | POA: Diagnosis not present

## 2021-08-10 DIAGNOSIS — E079 Disorder of thyroid, unspecified: Secondary | ICD-10-CM

## 2021-08-10 DIAGNOSIS — T466X5A Adverse effect of antihyperlipidemic and antiarteriosclerotic drugs, initial encounter: Secondary | ICD-10-CM

## 2021-08-10 DIAGNOSIS — R739 Hyperglycemia, unspecified: Secondary | ICD-10-CM | POA: Diagnosis not present

## 2021-08-10 DIAGNOSIS — M791 Myalgia, unspecified site: Secondary | ICD-10-CM

## 2021-08-10 DIAGNOSIS — M25561 Pain in right knee: Secondary | ICD-10-CM | POA: Diagnosis not present

## 2021-08-10 DIAGNOSIS — I6523 Occlusion and stenosis of bilateral carotid arteries: Secondary | ICD-10-CM

## 2021-08-10 DIAGNOSIS — I739 Peripheral vascular disease, unspecified: Secondary | ICD-10-CM | POA: Diagnosis not present

## 2021-08-10 DIAGNOSIS — I10 Essential (primary) hypertension: Secondary | ICD-10-CM | POA: Diagnosis not present

## 2021-08-10 DIAGNOSIS — E673 Hypervitaminosis D: Secondary | ICD-10-CM

## 2021-08-10 DIAGNOSIS — N189 Chronic kidney disease, unspecified: Secondary | ICD-10-CM

## 2021-08-10 DIAGNOSIS — R002 Palpitations: Secondary | ICD-10-CM

## 2021-08-10 DIAGNOSIS — G8929 Other chronic pain: Secondary | ICD-10-CM

## 2021-08-10 DIAGNOSIS — Z23 Encounter for immunization: Secondary | ICD-10-CM | POA: Diagnosis not present

## 2021-08-10 MED ORDER — ULORIC 40 MG PO TABS: 40.0000 mg | ORAL_TABLET | ORAL | 1 refills | Status: DC

## 2021-08-10 MED ORDER — MUPIROCIN CALCIUM 2 % EX CREA: 1.0000 "application " | TOPICAL_CREAM | Freq: Two times a day (BID) | CUTANEOUS | 1 refills | Status: DC

## 2021-08-10 MED ORDER — GABAPENTIN 300 MG PO CAPS: 300.0000 mg | ORAL_CAPSULE | Freq: Every day | ORAL | 1 refills | Status: DC

## 2021-08-10 MED ORDER — SYNTHROID 50 MCG PO TABS: 50.0000 ug | ORAL_TABLET | Freq: Every day | ORAL | 1 refills | Status: DC

## 2021-08-10 NOTE — Patient Instructions (Signed)
Drop Uloric to every other day Consider COVID booster   Hypertension, Adult High blood pressure (hypertension) is when the force of blood pumping through the arteries is too strong. The arteries are the blood vessels that carry blood from the heart throughout the body. Hypertension forces the heart to work harder to pump blood and may cause arteries to become narrow or stiff. Untreated or uncontrolled hypertension can cause a heart attack, heart failure, a stroke, kidney disease, and other problems. A blood pressure reading consists of a higher number over a lower number. Ideally, your blood pressure should be below 120/80. The first ("top") number is called the systolic pressure. It is a measure of the pressure in your arteries as your heart beats. The second ("bottom") number is called the diastolic pressure. It is a measure of the pressure in your arteries as the heart relaxes. What are the causes? The exact cause of this condition is not known. There are some conditions that result in or are related to high blood pressure. What increases the risk? Some risk factors for high blood pressure are under your control. The following factors may make you more likely to develop this condition: Smoking. Having type 2 diabetes mellitus, high cholesterol, or both. Not getting enough exercise or physical activity. Being overweight. Having too much fat, sugar, calories, or salt (sodium) in your diet. Drinking too much alcohol. Some risk factors for high blood pressure may be difficult or impossible to change. Some of these factors include: Having chronic kidney disease. Having a family history of high blood pressure. Age. Risk increases with age. Race. You may be at higher risk if you are African American. Gender. Men are at higher risk than women before age 10. After age 58, women are at higher risk than men. Having obstructive sleep apnea. Stress. What are the signs or symptoms? High blood pressure  may not cause symptoms. Very high blood pressure (hypertensive crisis) may cause: Headache. Anxiety. Shortness of breath. Nosebleed. Nausea and vomiting. Vision changes. Severe chest pain. Seizures. How is this diagnosed? This condition is diagnosed by measuring your blood pressure while you are seated, with your arm resting on a flat surface, your legs uncrossed, and your feet flat on the floor. The cuff of the blood pressure monitor will be placed directly against the skin of your upper arm at the level of your heart. It should be measured at least twice using the same arm. Certain conditions can cause a difference in blood pressure between your right and left arms. Certain factors can cause blood pressure readings to be lower or higher than normal for a short period of time: When your blood pressure is higher when you are in a health care provider's office than when you are at home, this is called white coat hypertension. Most people with this condition do not need medicines. When your blood pressure is higher at home than when you are in a health care provider's office, this is called masked hypertension. Most people with this condition may need medicines to control blood pressure. If you have a high blood pressure reading during one visit or you have normal blood pressure with other risk factors, you may be asked to: Return on a different day to have your blood pressure checked again. Monitor your blood pressure at home for 1 week or longer. If you are diagnosed with hypertension, you may have other blood or imaging tests to help your health care provider understand your overall risk for other  conditions. How is this treated? This condition is treated by making healthy lifestyle changes, such as eating healthy foods, exercising more, and reducing your alcohol intake. Your health care provider may prescribe medicine if lifestyle changes are not enough to get your blood pressure under control,  and if: Your systolic blood pressure is above 130. Your diastolic blood pressure is above 80. Your personal target blood pressure may vary depending on your medical conditions, your age, and other factors. Follow these instructions at home: Eating and drinking  Eat a diet that is high in fiber and potassium, and low in sodium, added sugar, and fat. An example eating plan is called the DASH (Dietary Approaches to Stop Hypertension) diet. To eat this way: Eat plenty of fresh fruits and vegetables. Try to fill one half of your plate at each meal with fruits and vegetables. Eat whole grains, such as whole-wheat pasta, brown rice, or whole-grain bread. Fill about one fourth of your plate with whole grains. Eat or drink low-fat dairy products, such as skim milk or low-fat yogurt. Avoid fatty cuts of meat, processed or cured meats, and poultry with skin. Fill about one fourth of your plate with lean proteins, such as fish, chicken without skin, beans, eggs, or tofu. Avoid pre-made and processed foods. These tend to be higher in sodium, added sugar, and fat. Reduce your daily sodium intake. Most people with hypertension should eat less than 1,500 mg of sodium a day. Do not drink alcohol if: Your health care provider tells you not to drink. You are pregnant, may be pregnant, or are planning to become pregnant. If you drink alcohol: Limit how much you use to: 0-1 drink a day for women. 0-2 drinks a day for men. Be aware of how much alcohol is in your drink. In the [place] one drink equals one 12 oz bottle of beer (355 mL), one 5 oz glass of wine (148 mL), or one 1 oz glass of hard liquor (44 mL). Lifestyle  Work with your health care provider to maintain a healthy body weight or to lose weight. Ask what an ideal weight is for you. Get at least 30 minutes of exercise most days of the week. Activities may include walking, swimming, or biking. Include exercise to strengthen your muscles (resistance  exercise), such as Pilates or lifting weights, as part of your weekly exercise routine. Try to do these types of exercises for 30 minutes at least 3 days a week. Do not use any products that contain nicotine or tobacco, such as cigarettes, e-cigarettes, and chewing tobacco. If you need help quitting, ask your health care provider. Monitor your blood pressure at home as told by your health care provider. Keep all follow-up visits as told by your health care provider. This is important. Medicines Take over-the-counter and prescription medicines only as told by your health care provider. Follow directions carefully. Blood pressure medicines must be taken as prescribed. Do not skip doses of blood pressure medicine. Doing this puts you at risk for problems and can make the medicine less effective. Ask your health care provider about side effects or reactions to medicines that you should watch for. Contact a health care provider if you: Think you are having a reaction to a medicine you are taking. Have headaches that keep coming back (recurring). Feel dizzy. Have swelling in your ankles. Have trouble with your vision. Get help right away if you: Develop a severe headache or confusion. Have unusual weakness or numbness. Feel faint. Have  severe pain in your chest or abdomen. Vomit repeatedly. Have trouble breathing. Summary Hypertension is when the force of blood pumping through your arteries is too strong. If this condition is not controlled, it may put you at risk for serious complications. Your personal target blood pressure may vary depending on your medical conditions, your age, and other factors. For most people, a normal blood pressure is less than 120/80. Hypertension is treated with lifestyle changes, medicines, or a combination of both. Lifestyle changes include losing weight, eating a healthy, low-sodium diet, exercising more, and limiting alcohol. This information is not intended to  replace advice given to you by your health care provider. Make sure you discuss any questions you have with your health care provider. Document Revised: 07/09/2018 Document Reviewed: 07/09/2018 Elsevier Patient Education  [place].

## 2021-08-10 NOTE — Assessment & Plan Note (Addendum)
She is having increased pain and coldness in feet, will continue to treat risk factors and is referred to vascular surgery for further evalaution

## 2021-08-10 NOTE — Progress Notes (Signed)
Patient ID: Christina Mckinney, female    DOB: Oct 03, 1932  Age: 87 y.o. MRN: [id number]    Subjective:   Chief Complaint  Patient presents with   Follow-up    Pt would like a back and legs looked   Subjective   HPI Christina Mckinney presents for office visit today for follow up on htn and CHF. She reports discomfort in bilateral LE's. She feels that her legs are hot and usually she notices it at night. She started experiencing the discomfort around 2 weeks ago without any obvious trigger.  5 years ago when she was on a prolonged flight she started having lower leg pain and she was found to have bilateral blood clots so she is worried. Denies CP/SOB/HA/congestion/fevers/GI or GU c/o. Taking meds as prescribed. She experiences palpitations/tachycardia which she usually notices at night. She was concerned and brought it up to her cardiologist and her Uloric dosage was increased to 40 mg. She also experiences sweating with the palpitations.  She lost her 68 y/o sister last week due to pneumonia.   Review of Systems  Constitutional:  Positive for diaphoresis. Negative for chills, fatigue and fever.  HENT:  Negative for congestion, rhinorrhea, sinus pressure, sinus pain and sore throat.   Eyes:  Negative for pain.  Respiratory:  Negative for cough and shortness of breath.   Cardiovascular:  Positive for palpitations. Negative for chest pain and leg swelling.  Gastrointestinal:  Negative for abdominal pain, blood in stool, diarrhea, nausea and vomiting.  Genitourinary:  Negative for decreased urine volume, flank pain, frequency, vaginal bleeding and vaginal discharge.  Musculoskeletal:  Negative for back pain.  Neurological:  Negative for headaches.   History Past Medical History:  Diagnosis Date   Anemia, unspecified    Anxiety and depression 09/18/2015   Anxiety state, unspecified    Chest pain    Chronic systolic CHF (congestive heart failure) ([place]) 08/12/2015   Depression with anxiety  06/02/2007   Qualifier: Diagnosis of  By: Wynona Luna    Depressive disorder, not elsewhere classified    Diverticulitis    DVT (deep venous thrombosis) ([place])    Dyspnea 07/22/2015   Edema 06/12/2014   Essential and other specified forms of tremor    GERD (gastroesophageal reflux disease)    Gout, unspecified    Internal hemorrhoids without mention of complication    Macular degeneration 08/07/2015   Osteoarthritis    Other abnormal glucose    Other and unspecified hyperlipidemia    Personal history of colonic polyps    Personal history of peptic ulcer disease    Pulmonary embolism (H[facility])    Unspecified disorder of bladder    Unspecified essential hypertension    Unspecified hypothyroidism    Varicose veins     She has a past surgical history that includes Cholecystectomy; Abdominal hysterectomy; Nasal septum surgery; Shoulder surgery; Knee arthroscopy; Eye surgery; Endovenous ablation saphenous vein w/ laser (09-11-2012); and Total knee arthroplasty (09/2013).   Her family history includes Aneurysm in her daughter; Cancer in her sister; Diabetes in her daughter; Heart attack in her father; Heart disease in her brother, daughter, father, and sister; Hip fracture in her mother; Hyperlipidemia in her daughter and father; Hypertension in her daughter; Other in her mother.She reports that she quit smoking about 32 years ago. Her smoking use included cigarettes. She has never used smokeless tobacco. She reports current alcohol use of about 4.0 standard drinks per week. She reports that she does not use  drugs.  Current Outpatient Medications on File Prior to Visit  Medication Sig Dispense Refill   albuterol (PROAIR HFA) 108 (90 Base) MCG/ACT inhaler Inhale 2 puffs into the lungs every 6 (six) hours as needed for wheezing or shortness of breath. 54 g 3   aspirin 81 MG tablet Take 1 tablet (81 mg total) by mouth daily. 30 tablet    co-enzyme Q-10 30 MG capsule Take 30 mg by mouth daily.      fexofenadine (ALLEGRA ALLERGY) 180 MG tablet Take 1 tablet (180 mg total) by mouth daily. 90 tablet 1   fluticasone (FLONASE) 50 MCG/ACT nasal spray Place 2 sprays into both nostrils daily. USE 2 SPRAYS IN EACH NOSTRIL DAILY AS NEEDED FOR ALLERGIES OR RHINITIS 16 g 5   fluticasone (FLOVENT HFA) 110 MCG/ACT inhaler Inhale 2 puffs into the lungs 2 (two) times daily. 3 Inhaler 1   furosemide (LASIX) 40 MG tablet TAKE 1 TABLET DAILY 90 tablet 3   guaiFENesin (MUCINEX) 600 MG 12 hr tablet Take 1,200 mg by mouth 2 (two) times daily as needed for to loosen phlegm.      ipratropium-albuterol (DUONEB) 0.5-2.5 (3) MG/3ML SOLN USE THREE TIMES A DAY FOR NEXT 3 DAYS AND THEN AS NEEDED FOR WHEEZING. 60 mL 8   losartan (COZAAR) 25 MG tablet Take 1 tablet (25 mg total) by mouth daily. 90 tablet 3   pantoprazole (PROTONIX) 40 MG tablet Take 1 tablet (40 mg total) by mouth 2 (two) times daily before a meal. 180 tablet 1   propranolol ER (INDERAL LA) 80 MG 24 hr capsule Take 1 capsule (80 mg total) by mouth daily. 90 capsule 3   sucralfate (CARAFATE) 1 g tablet TAKE 1 TABLET IN THE MORNING, AT NOON, IN THE EVENING AND AT BEDTIME 360 tablet 3   No current facility-administered medications on file prior to visit.     Objective:  Objective  Physical Exam Constitutional:      General: She is not in acute distress.    Appearance: Normal appearance. She is not ill-appearing or toxic-appearing.  HENT:     Head: Normocephalic and atraumatic.     Right Ear: Tympanic membrane, ear canal and external ear normal.     Left Ear: Tympanic membrane, ear canal and external ear normal.     Nose: No congestion or rhinorrhea.  Eyes:     Extraocular Movements: Extraocular movements intact.     Pupils: Pupils are equal, round, and reactive to light.  Cardiovascular:     Rate and Rhythm: Normal rate and regular rhythm.     Pulses: Normal pulses.     Heart sounds: Normal heart sounds. No murmur heard. Pulmonary:     Effort:  Pulmonary effort is normal. No respiratory distress.     Breath sounds: Normal breath sounds. No wheezing, rhonchi or rales.  Abdominal:     General: Bowel sounds are normal.     Palpations: Abdomen is soft. There is no mass.     Tenderness: There is no abdominal tenderness. There is no guarding.     Hernia: No hernia is present.  Musculoskeletal:        General: Normal range of motion.     Cervical back: Normal range of motion and neck supple.  Skin:    General: Skin is warm and dry.  Neurological:     Mental Status: She is alert and oriented to person, place, and time.  Psychiatric:        Behavior:  Behavior normal.   BP 108/66   Pulse 81   Temp 97.8 F (36.6 C)   Resp 16   Wt 147 lb (66.7 kg)   SpO2 97%   BMI 27.78 kg/m  Wt Readings from Last 3 Encounters:  08/10/21 147 lb (66.7 kg)  07/03/21 143 lb (64.9 kg)  06/23/21 143 lb (64.9 kg)     Lab Results  Component Value Date   WBC 7.5 04/25/2021   HGB 12.0 04/25/2021   HCT 35.7 (L) 04/25/2021   PLT 182.0 04/25/2021   GLUCOSE 100 (H) 04/25/2021   CHOL 176 04/25/2021   TRIG 133.0 04/25/2021   HDL 46.40 04/25/2021   LDLDIRECT 83.0 05/21/2019   LDLCALC 103 (H) 04/25/2021   ALT 5 04/25/2021   AST 20 04/25/2021   NA 141 04/25/2021   K 4.7 04/25/2021   CL 102 04/25/2021   CREATININE 1.28 (H) 04/25/2021   BUN 21 04/25/2021   CO2 31 04/25/2021   TSH 4.99 (H) 04/25/2021   INR 1.0 07/21/2010   HGBA1C 5.3 09/13/2020   MICROALBUR 0.9 01/31/2016    MM DIAG BREAST TOMO BILATERAL  Result Date: 05/05/2021 CLINICAL DATA:  87 year old female with a boil under the left axilla which has since erupted and improved symptomatically. The patient is toward the end of a course of antibiotics. EXAM: DIGITAL DIAGNOSTIC BILATERAL MAMMOGRAM WITH TOMOSYNTHESIS AND CAD; [place] AXILLARY LEFT TECHNIQUE: Bilateral digital diagnostic mammography and breast tomosynthesis was performed. The images were evaluated with computer-aided detection.;  Targeted ultrasound examination of the left axilla was performed. COMPARISON:  Previous exam(s). ACR Breast Density Category b: There are scattered areas of fibroglandular density. FINDINGS: No suspicious mammographic findings are identified within either breast or within the left axilla. On physical exam, there is a small, ruptured boil within the left axilla. No surrounding skin redness or warmth. Targeted ultrasound is performed, showing a vague hypoechoic mass located entirely within the skin in the left axilla. It measures up to 9 mm. There is no internal vascularity. IMPRESSION: 1. Benign ruptured boil in the left axilla. Recommendation is for clinical and symptomatic follow-up. 2. No mammographic evidence of malignancy in either breast. RECOMMENDATION: Screening mammogram in one year.(Code:SM-B-01Y) I have discussed the findings and recommendations with the patient. If applicable, a reminder letter will be sent to the patient regarding the next appointment. BI-RADS CATEGORY  2: Benign. Electronically Signed   By: Kristopher Oppenheim M.D.   On: 05/05/2021 15:17   [place] AXILLA LEFT  Result Date: 05/05/2021 CLINICAL DATA:  87 year old female with a boil under the left axilla which has since erupted and improved symptomatically. The patient is toward the end of a course of antibiotics. EXAM: DIGITAL DIAGNOSTIC BILATERAL MAMMOGRAM WITH TOMOSYNTHESIS AND CAD; [place] AXILLARY LEFT TECHNIQUE: Bilateral digital diagnostic mammography and breast tomosynthesis was performed. The images were evaluated with computer-aided detection.; Targeted ultrasound examination of the left axilla was performed. COMPARISON:  Previous exam(s). ACR Breast Density Category b: There are scattered areas of fibroglandular density. FINDINGS: No suspicious mammographic findings are identified within either breast or within the left axilla. On physical exam, there is a small, ruptured boil within the left axilla. No surrounding skin redness or warmth.  Targeted ultrasound is performed, showing a vague hypoechoic mass located entirely within the skin in the left axilla. It measures up to 9 mm. There is no internal vascularity. IMPRESSION: 1. Benign ruptured boil in the left axilla. Recommendation is for clinical and symptomatic follow-up. 2. No mammographic evidence of  malignancy in either breast. RECOMMENDATION: Screening mammogram in one year.(Code:SM-B-01Y) I have discussed the findings and recommendations with the patient. If applicable, a reminder letter will be sent to the patient regarding the next appointment. BI-RADS CATEGORY  2: Benign. Electronically Signed   By: Kristopher Oppenheim M.D.   On: 05/05/2021 15:17    Assessment & Plan:  Plan    Meds ordered this encounter  Medications   gabapentin (NEURONTIN) 300 MG capsule    Sig: Take 1 capsule (300 mg total) by mouth at bedtime.    Dispense:  90 capsule    Refill:  1   SYNTHROID 50 MCG tablet    Sig: Take 1 tablet (50 mcg total) by mouth daily before breakfast.    Dispense:  90 tablet    Refill:  1   mupirocin cream (BACTROBAN) 2 %    Sig: Apply 1 application topically 2 (two) times daily. Apply to the nose twice a day x 1 week    Dispense:  15 g    Refill:  1   ULORIC 40 MG tablet    Sig: Take 1 tablet (40 mg total) by mouth every other day.    Dispense:  90 tablet    Refill:  1    Please cancel rx for generic.  Generic causes dizziness.    Problem List Items Addressed This Visit     Thyroid disease   Relevant Medications   SYNTHROID 50 MCG tablet   Other Relevant Orders   T4, free   Hyperlipidemia - Primary    Encourage heart healthy diet such as MIND or DASH diet, increase exercise, avoid trans fats, simple carbohydrates and processed foods, consider a krill or fish or flaxseed oil cap daily.       Relevant Orders   CBC with Differential/Platelet   Lipid panel   Gout    No recent flares. Drop Uloric to 40 mg qod      Relevant Medications   gabapentin  (NEURONTIN) 300 MG capsule   Primary hypertension    Well controlled, no changes to meds. Encouraged heart healthy diet such as the DASH diet and exercise as tolerated.       Relevant Orders   CBC with Differential/Platelet   Comprehensive metabolic panel   TSH   Right knee pain    Encouraged moist heat and gentle stretching as tolerated. May try NSAIDs and prescription meds as directed and report if symptoms worsen or seek immediate care. Notes right hip pain as well. She is following with ortho      High vitamin D level    Continue to monitor      CRI (chronic renal insufficiency)    Hydrate and monitor      Palpitations    Has increased recently and she has been seen by cardiology. They have increased her Inderal to 80 mg and while the intensity of her symptoms have improved she is still having symptoms nightly often with associated diaphoresis. she will follow up with cardiology if symptoms persist and/or worsen      PVD (peripheral vascular disease) ([place])    She is having increased pain and coldness in feet, will continue to treat risk factors and is referred to vascular surgery for further evalaution       Relevant Orders   Ambulatory referral to Vascular Surgery   Hypothyroid    On Levothyroxine, continue to monitor      Relevant Medications   SYNTHROID 50 MCG tablet  Myalgia due to statin    She declines statins due to history      RESOLVED: Hyperglycemia    hgba1c acceptable, minimize simple carbs. Increase exercise as tolerated.       Relevant Orders   Hemoglobin A1c   Other Visit Diagnoses     Need for influenza vaccination       Relevant Orders   Flu Vaccine QUAD High Dose(Fluad) (Completed)       Follow-up: Return in about 4 months (around 12/10/2021).  I, Suezanne Jacquet, acting as a scribe for [name], MD, have documented all relevent documentation on behalf of [name], MD, as directed by [name], MD while in the presence of [name], MD. DO:08/11/21.  I, Mosie Lukes, MD personally performed the services described in this documentation. All medical record entries made by the scribe were at my direction and in my presence. I have reviewed the chart and agree that the record reflects my personal performance and is accurate and complete

## 2021-08-11 DIAGNOSIS — M791 Myalgia, unspecified site: Secondary | ICD-10-CM | POA: Insufficient documentation

## 2021-08-11 DIAGNOSIS — E039 Hypothyroidism, unspecified: Secondary | ICD-10-CM | POA: Insufficient documentation

## 2021-08-11 DIAGNOSIS — T466X5A Adverse effect of antihyperlipidemic and antiarteriosclerotic drugs, initial encounter: Secondary | ICD-10-CM | POA: Insufficient documentation

## 2021-08-11 LAB — LIPID PANEL
Cholesterol: 228 mg/dL — ABNORMAL HIGH (ref 0–200)
HDL: 56.9 mg/dL (ref >39.00)
NonHDL: 170.66
Total CHOL/HDL Ratio: 4
Triglycerides: 216 mg/dL — ABNORMAL HIGH (ref 0.0–149.0)
VLDL: 43.2 mg/dL — ABNORMAL HIGH (ref 0.0–40.0)

## 2021-08-11 LAB — HEMOGLOBIN A1C: Hgb A1c MFr Bld: 5.7 % (ref 4.6–6.5)

## 2021-08-11 LAB — COMPREHENSIVE METABOLIC PANEL
ALT: 7 U/L (ref 0–35)
AST: 21 U/L (ref 0–37)
Albumin: 4.4 g/dL (ref 3.5–5.2)
Alkaline Phosphatase: 84 U/L (ref 39–117)
BUN: 16 mg/dL (ref 6–23)
CO2: 30 mEq/L (ref 19–32)
Calcium: 10.7 mg/dL — ABNORMAL HIGH (ref 8.4–10.5)
Chloride: 104 mEq/L (ref 96–112)
Creatinine, Ser: 1.18 mg/dL (ref 0.40–1.20)
GFR: 41.51 mL/min — ABNORMAL LOW (ref >60.00)
Glucose, Bld: 97 mg/dL (ref 70–99)
Potassium: 5.1 mEq/L (ref 3.5–5.1)
Sodium: 143 mEq/L (ref 135–145)
Total Bilirubin: 0.6 mg/dL (ref 0.2–1.2)
Total Protein: 7.4 g/dL (ref 6.0–8.3)

## 2021-08-11 LAB — CBC WITH DIFFERENTIAL/PLATELET
Basophils Absolute: 0.1 10*3/uL (ref 0.0–0.1)
Basophils Relative: 0.8 % (ref 0.0–3.0)
Eosinophils Absolute: 0.4 10*3/uL (ref 0.0–0.7)
Eosinophils Relative: 4 % (ref 0.0–5.0)
HCT: 38.9 % (ref 36.0–46.0)
Hemoglobin: 12.7 g/dL (ref 12.0–15.0)
Lymphocytes Relative: 20 % (ref 12.0–46.0)
Lymphs Abs: 1.8 10*3/uL (ref 0.7–4.0)
MCHC: 32.6 g/dL (ref 30.0–36.0)
MCV: 97.2 fl (ref 78.0–100.0)
Monocytes Absolute: 0.5 10*3/uL (ref 0.1–1.0)
Monocytes Relative: 5.6 % (ref 3.0–12.0)
Neutro Abs: 6.3 10*3/uL (ref 1.4–7.7)
Neutrophils Relative %: 69.6 % (ref 43.0–77.0)
Platelets: 239 10*3/uL (ref 150.0–400.0)
RBC: 4 Mil/uL (ref 3.87–5.11)
RDW: 14.1 % (ref 11.5–15.5)
WBC: 9.1 10*3/uL (ref 4.0–10.5)

## 2021-08-11 LAB — LDL CHOLESTEROL, DIRECT: Direct LDL: 125 mg/dL

## 2021-08-11 LAB — TSH: TSH: 2.5 u[IU]/mL (ref 0.35–5.50)

## 2021-08-11 LAB — T4, FREE: Free T4: 1.62 ng/dL — ABNORMAL HIGH (ref 0.60–1.60)

## 2021-08-11 NOTE — Assessment & Plan Note (Signed)
hgba1c acceptable, minimize simple carbs. Increase exercise as tolerated.  

## 2021-08-11 NOTE — Assessment & Plan Note (Signed)
Well controlled, no changes to meds. Encouraged heart healthy diet such as the DASH diet and exercise as tolerated.  °

## 2021-08-11 NOTE — Assessment & Plan Note (Signed)
Has increased recently and she has been seen by cardiology. They have increased her Inderal to 80 mg and while the intensity of her symptoms have improved she is still having symptoms nightly often with associated diaphoresis. she will follow up with cardiology if symptoms persist and/or worsen

## 2021-08-11 NOTE — Assessment & Plan Note (Signed)
Continue to monitor

## 2021-08-11 NOTE — Assessment & Plan Note (Signed)
On Levothyroxine, continue to monitor 

## 2021-08-11 NOTE — Assessment & Plan Note (Signed)
Encouraged moist heat and gentle stretching as tolerated. May try NSAIDs and prescription meds as directed and report if symptoms worsen or seek immediate care. Notes right hip pain as well. She is following with ortho

## 2021-08-11 NOTE — Assessment & Plan Note (Signed)
Hydrate and monitor 

## 2021-08-11 NOTE — Assessment & Plan Note (Signed)
She declines statins due to history

## 2021-08-11 NOTE — Assessment & Plan Note (Signed)
No recent flares. Drop Uloric to 40 mg qod

## 2021-08-11 NOTE — Assessment & Plan Note (Signed)
Encourage heart healthy diet such as MIND or DASH diet, increase exercise, avoid trans fats, simple carbohydrates and processed foods, consider a krill or fish or flaxseed oil cap daily.  °

## 2021-08-21 ENCOUNTER — Other Ambulatory Visit: Payer: Self-pay | Admitting: Family Medicine

## 2021-08-28 ENCOUNTER — Other Ambulatory Visit: Payer: Self-pay | Admitting: Family Medicine

## 2021-09-12 ENCOUNTER — Other Ambulatory Visit: Payer: Self-pay

## 2021-09-12 DIAGNOSIS — I739 Peripheral vascular disease, unspecified: Secondary | ICD-10-CM

## 2021-09-18 NOTE — Progress Notes (Signed)
[facility] OF [place]  ASSESSMENT / PLAN: Christina Mckinney is a 87 y.o. female without evidence of hemodynamically significant peripheral arterial disease by exam or non-invasive testing. I suspect her symptoms are being caused by neuropathy. Will refer her back to PCP. Follow up with me as needed.  CHIEF COMPLAINT: leg pain  HISTORY OF PRESENT ILLNESS: Christina Mckinney is a 87 y.o. female with past history of venous ablation for greater saphenous vein incompetence who presents to clinic for evaluation of bilateral foot ankle and calf discomfort.  She reports burning, almost electric discomfort in her legs.  She feels like her "blood is boiling" in her feet.  The pain comes and goes and does not seem related to activity.  The pain is located in a "stocking" distribution over her bilateral lower extremities.  She feels that the pain in her feet is related to episodes of palpitation she feels.  She is currently undergoing work-up for this with cardiology service.  Past Medical History:  Diagnosis Date   Anemia, unspecified    Anxiety and depression 09/18/2015   Anxiety state, unspecified    Chest pain    Chronic systolic CHF (congestive heart failure) ([place]) 08/12/2015   Depression with anxiety 06/02/2007   Qualifier: Diagnosis of  By: Wynona Luna    Depressive disorder, not elsewhere classified    Diverticulitis    DVT (deep venous thrombosis) ([place])    Dyspnea 07/22/2015   Edema 06/12/2014   Essential and other specified forms of tremor    GERD (gastroesophageal reflux disease)    Gout, unspecified    Internal hemorrhoids without mention of complication    Macular degeneration 08/07/2015   Osteoarthritis    Other abnormal glucose    Other and unspecified hyperlipidemia    Personal history of colonic polyps    Personal history of peptic ulcer disease    Pulmonary embolism ([place])    Unspecified disorder of bladder    Unspecified essential hypertension    Unspecified  hypothyroidism    Varicose veins     Past Surgical History:  Procedure Laterality Date   ABDOMINAL HYSTERECTOMY     CHOLECYSTECTOMY     ENDOVENOUS ABLATION SAPHENOUS VEIN W/ LASER  09-11-2012   left greater saphenous vein   by Curt Jews MD   EYE SURGERY     KNEE ARTHROSCOPY     right    NASAL SEPTUM SURGERY     SHOULDER SURGERY     TOTAL KNEE ARTHROPLASTY  09/2013   Right    Family History  Problem Relation Age of Onset   Other Mother        varicose veins   Hip fracture Mother    Heart disease Father        CHF   Hyperlipidemia Father    Heart attack Father    Cancer Sister        stomach   Heart disease Sister    Heart disease Brother    Diabetes Daughter    Heart disease Daughter    Hyperlipidemia Daughter    Hypertension Daughter    Aneurysm Daughter    Colon cancer Neg Hx     Social History   Socioeconomic History   Marital status: Widowed    Spouse name: Not on file   Number of children: 3   Years of education: Not on file   Highest education level: Not on file  Occupational History   Occupation: Retired  Tobacco Use   Smoking status: Former    Years: 40.00    Types: Cigarettes    Quit date: 11/12/1988    Years since quitting: 32.8   Smokeless tobacco: Never  Substance and Sexual Activity   Alcohol use: Yes    Alcohol/week: 4.0 standard drinks    Types: 4 Standard drinks or equivalent per week    Comment: occasional wine    Drug use: No   Sexual activity: Never    Comment: lives alone, no dietary restrictions, widowed  Other Topics Concern   Not on file  Social History Narrative   Not on file   Social Determinants of Health   Financial Resource Strain: Not on file  Food Insecurity: Not on file  Transportation Needs: Not on file  Physical Activity: Not on file  Stress: Not on file  Social Connections: Not on file  Intimate Partner Violence: Not on file    Allergies  Allergen Reactions   Simvastatin Other (See Comments)    Hip  pain   Crestor [Rosuvastatin]     Left hip pain   Lipitor [Atorvastatin]     Pain in hips   Red Dye     Hip pain   Statins Other (See Comments)    Muscle aches, could not walk    Current Outpatient Medications  Medication Sig Dispense Refill   albuterol (VENTOLIN HFA) 108 (90 Base) MCG/ACT inhaler USE 2 INHALATIONS EVERY 6 HOURS AS NEEDED FOR WHEEZING OR SHORTNESS OF BREATH 51 g 3   aspirin 81 MG tablet Take 1 tablet (81 mg total) by mouth daily. 30 tablet    co-enzyme Q-10 30 MG capsule Take 30 mg by mouth daily.     fexofenadine (ALLEGRA ALLERGY) 180 MG tablet Take 1 tablet (180 mg total) by mouth daily. 90 tablet 1   fluticasone (FLONASE) 50 MCG/ACT nasal spray Place 2 sprays into both nostrils daily. USE 2 SPRAYS IN EACH NOSTRIL DAILY AS NEEDED FOR ALLERGIES OR RHINITIS 16 g 5   fluticasone (FLOVENT HFA) 110 MCG/ACT inhaler Inhale 2 puffs into the lungs 2 (two) times daily. 3 Inhaler 1   furosemide (LASIX) 40 MG tablet TAKE 1 TABLET DAILY 90 tablet 3   gabapentin (NEURONTIN) 300 MG capsule Take 1 capsule (300 mg total) by mouth at bedtime. 90 capsule 1   guaiFENesin (MUCINEX) 600 MG 12 hr tablet Take 1,200 mg by mouth 2 (two) times daily as needed for to loosen phlegm.      ipratropium-albuterol (DUONEB) 0.5-2.5 (3) MG/3ML SOLN USE THREE TIMES A DAY FOR NEXT 3 DAYS AND THEN AS NEEDED FOR WHEEZING. 60 mL 8   losartan (COZAAR) 25 MG tablet Take 1 tablet (25 mg total) by mouth daily. 90 tablet 3   mupirocin cream (BACTROBAN) 2 % Apply 1 application topically 2 (two) times daily. Apply to the nose twice a day x 1 week 15 g 1   pantoprazole (PROTONIX) 40 MG tablet TAKE 1 TABLET TWICE A DAY BEFORE MEALS 180 tablet 3   propranolol ER (INDERAL LA) 80 MG 24 hr capsule Take 1 capsule (80 mg total) by mouth daily. 90 capsule 3   sucralfate (CARAFATE) 1 g tablet TAKE 1 TABLET IN THE MORNING, AT NOON, IN THE EVENING AND AT BEDTIME 360 tablet 3   SYNTHROID 50 MCG tablet Take 1 tablet (50 mcg total)  by mouth daily before breakfast. 90 tablet 1   ULORIC 40 MG tablet Take 1 tablet (40 mg total) by mouth  every other day. 90 tablet 1   No current facility-administered medications for this visit.    REVIEW OF SYSTEMS:  [X]  denotes positive finding, [ ]  denotes negative finding Cardiac  Comments:  Chest pain or chest pressure:    Shortness of breath upon exertion: x   Short of breath when lying flat:    Irregular heart rhythm: x       Vascular    Pain in calf, thigh, or hip brought on by ambulation: x   Pain in feet at night that wakes you up from your sleep:  x   Blood clot in your veins:    Leg swelling:  x       Pulmonary    Oxygen at home:    Productive cough:     Wheezing:  x       Neurologic    Sudden weakness in arms or legs:     Sudden numbness in arms or legs:     Sudden onset of difficulty speaking or slurred speech:    Temporary loss of vision in one eye:     Problems with dizziness:  x       Gastrointestinal    Blood in stool:     Vomited blood:         Genitourinary    Burning when urinating:     Blood in urine:        Psychiatric    Major depression:         Hematologic    Bleeding problems:    Problems with blood clotting too easily:        Skin    Rashes or ulcers: x       Constitutional    Fever or chills:      PHYSICAL EXAM Vitals:   09/19/21 1446  BP: 126/68  Pulse: 64  Resp: 20  Temp: 98.5 F (36.9 C)  SpO2: 97%  Weight: 145 lb (65.8 kg)  Height: 5\' 1"  (1.549 m)    Constitutional: well appearing. no distress. Appears well nourished.  Neurologic: CN intact. no focal findings. no sensory loss. Psychiatric:  Mood and affect symmetric and appropriate. Eyes:  No icterus. No conjunctival pallor. Ears, nose, throat:  mucous membranes moist. Midline trachea.  Cardiac: regular rate and rhythm.  Respiratory:  unlabored. Abdominal:  soft, non-tender, non-distended.  Peripheral vascular: 2+ DP pulse. Feet warm and well  perfused. Extremity: No edema. No cyanosis. No pallor.  Skin: no gangrene. no ulceration.  Lymphatic: no Stemmer's sign. no palpable lymphadenopathy.  PERTINENT LABORATORY AND RADIOLOGIC DATA  Most recent CBC CBC Latest Ref Rng & Units 08/10/2021 04/25/2021 09/13/2020  WBC 4.0 - 10.5 K/uL 9.1 7.5 7.7  Hemoglobin 12.0 - 15.0 g/dL 12.7 12.0 13.5  Hematocrit 36.0 - 46.0 % 38.9 35.7(L) 39.7  Platelets 150.0 - 400.0 K/uL 239.0 182.0 222     Most recent CMP CMP Latest Ref Rng & Units 08/10/2021 04/25/2021 10/12/2020  Glucose 70 - 99 mg/dL 97 100(H) 94  BUN 6 - 23 mg/dL 16 21 18   Creatinine 0.40 - 1.20 mg/dL 1.18 1.28(H) 1.14  Sodium 135 - 145 mEq/L 143 141 142  Potassium 3.5 - 5.1 mEq/L 5.1 4.7 4.5  Chloride 96 - 112 mEq/L 104 102 102  CO2 19 - 32 mEq/L 30 31 32  Calcium 8.4 - 10.5 mg/dL 10.7(H) 10.1 10.6(H)  Total Protein 6.0 - 8.3 g/dL 7.4 6.7 7.3  Total Bilirubin 0.2 - 1.2 mg/dL 0.6 0.6 0.6  Alkaline Phos 39 - 117 U/L 84 76 83  AST 0 - 37 U/L 21 20 19   ALT 0 - 35 U/L 7 5 6     Renal function CrCl cannot be calculated (Patient's most recent lab result is older than the maximum 21 days allowed.).  Hgb A1c MFr Bld (%)  Date Value  08/10/2021 5.7    LDL Cholesterol (Calc)  Date Value Ref Range Status  09/13/2020 116 (H) mg/dL (calc) Final    Comment:    Reference range: <100 . Desirable range <100 mg/dL for primary prevention;   <70 mg/dL for patients with CHD or diabetic patients  with > or = 2 CHD risk factors. Marland Kitchen LDL-C is now calculated using the Martin-Hopkins  calculation, which is a validated novel method providing  better accuracy than the Friedewald equation in the  estimation of LDL-C.  Cresenciano Genre et al. Annamaria Helling. 1610;960(45): 2061-2068  (http://education.[employer].com/faq/FAQ164)    LDL Cholesterol  Date Value Ref Range Status  04/25/2021 103 (H) 0 - 99 mg/dL Final   Direct LDL  Date Value Ref Range Status  08/10/2021 125.0 mg/dL Final    Comment:     Optimal:  <100 mg/dLNear or Above Optimal:  100-129 mg/dLBorderline High:  130-159 mg/dLHigh:  160-189 mg/dLVery High:  >190 mg/dL     +-------+-----------+-----------+------------+------------+  ABI/TBIToday's ABIToday's TBIPrevious ABIPrevious TBI  +-------+-----------+-----------+------------+------------+  Right  1.07       0.59       1.06        0.56          +-------+-----------+-----------+------------+------------+  Left   0.99       0.66       1.03        0.64          +-------+-----------+-----------+------------+------------+   Yevonne Aline. Stanford Breed, MD [facility] Phone Number: [phone] 09/18/2021 11:17 AM  Total time spent on preparing this encounter including chart review, data review, collecting history, examining the patient, coordinating care for this new patient, 45 minutes.  Portions of this report may have been transcribed using voice recognition software.  Every effort has been made to ensure accuracy; however, inadvertent computerized transcription errors may still be present.

## 2021-09-19 ENCOUNTER — Other Ambulatory Visit: Payer: Self-pay

## 2021-09-19 ENCOUNTER — Encounter: Payer: Self-pay | Admitting: Vascular Surgery

## 2021-09-19 ENCOUNTER — Ambulatory Visit (HOSPITAL_COMMUNITY)
Admission: RE | Admit: 2021-09-19 | Discharge: 2021-09-19 | Disposition: A | Discharge status: Home / Self Care | Payer: Medicare Other | Source: Ambulatory Visit | Attending: Vascular Surgery | Admitting: Vascular Surgery

## 2021-09-19 ENCOUNTER — Ambulatory Visit (INDEPENDENT_AMBULATORY_CARE_PROVIDER_SITE_OTHER): Payer: Medicare Other | Admitting: Vascular Surgery

## 2021-09-19 VITALS — BP 126/68 | HR 64 | Temp 98.5°F | Resp 20 | Ht 61.0 in | Wt 145.0 lb

## 2021-09-19 DIAGNOSIS — M79604 Pain in right leg: Secondary | ICD-10-CM

## 2021-09-19 DIAGNOSIS — I739 Peripheral vascular disease, unspecified: Secondary | ICD-10-CM | POA: Diagnosis not present

## 2021-09-19 DIAGNOSIS — M79605 Pain in left leg: Secondary | ICD-10-CM

## 2021-09-22 NOTE — Progress Notes (Signed)
Cardiology Office Note:   Date:  09/25/2021  NAME:  Christina Mckinney    MRN: [id number] DOB:  [birth year]   PCP:  Mosie Lukes, MD  Cardiologist:  Evalina Field, MD   Referring MD: Mosie Lukes, MD   Chief Complaint  Patient presents with   Follow-up   History of Present Illness:   Christina Mckinney is a 87 y.o. female with a hx of carotid artery disease, hypertension, hyperlipidemia who presents for follow-up.  Seen by PA's in our office recently.  Stress test normal.  Long-term cardiac monitor was normal.  Referred to vascular surgery for leg pain but no evidence of PAD.  She reports she is still having episodes of palpitations.  They occur daily.  She has rapid heartbeat sensation.  She feels that her heart is going to beat out of her chest.  She describes no chest pain or shortness of breath.  She does feels her heart is racing.  We discussed her recent heart monitor which was normal.  She also had a normal stress test.  Her blood pressure is well controlled.  We discussed trying metoprolol instead of propranolol to see if this helps.  She reports something needs to be done.  I informed her that I am uncertain if this will help her.  She is okay to try this medication.  Her blood pressure is well controlled in office.  She is not exercising routinely but has no major issues.  Denies any chest pain or significant shortness of breath.  Weights are stable in office.  Problem List 1. Carotid artery stenosis -bilateral 1-39% 04/15/2020 2. HTN 3. HLD -T chol 217, HDL 51, LDL 136, TG 148 -A1c 5.8 -statin intolerant/refuses zetia   Past Medical History: Past Medical History:  Diagnosis Date   Anemia, unspecified    Anxiety and depression 09/18/2015   Anxiety state, unspecified    Chest pain    Chronic systolic CHF (congestive heart failure) ([place]) 08/12/2015   Depression with anxiety 06/02/2007   Qualifier: Diagnosis of  By: Wynona Luna    Depressive disorder, not elsewhere  classified    Diverticulitis    DVT (deep venous thrombosis) ([place])    Dyspnea 07/22/2015   Edema 06/12/2014   Essential and other specified forms of tremor    GERD (gastroesophageal reflux disease)    Gout, unspecified    Internal hemorrhoids without mention of complication    Macular degeneration 08/07/2015   Osteoarthritis    Other abnormal glucose    Other and unspecified hyperlipidemia    Personal history of colonic polyps    Personal history of peptic ulcer disease    Pulmonary embolism ([place])    Unspecified disorder of bladder    Unspecified essential hypertension    Unspecified hypothyroidism    Varicose veins     Past Surgical History: Past Surgical History:  Procedure Laterality Date   ABDOMINAL HYSTERECTOMY     CHOLECYSTECTOMY     ENDOVENOUS ABLATION SAPHENOUS VEIN W/ LASER  09-11-2012   left greater saphenous vein   by Curt Jews MD   EYE SURGERY     KNEE ARTHROSCOPY     right    NASAL SEPTUM SURGERY     SHOULDER SURGERY     TOTAL KNEE ARTHROPLASTY  09/2013   Right    Current Medications: Current Meds  Medication Sig   albuterol (VENTOLIN HFA) 108 (90 Base) MCG/ACT inhaler USE 2 INHALATIONS EVERY 6 HOURS AS  NEEDED FOR WHEEZING OR SHORTNESS OF BREATH   aspirin 81 MG tablet Take 1 tablet (81 mg total) by mouth daily.   co-enzyme Q-10 30 MG capsule Take 30 mg by mouth daily.   fexofenadine (ALLEGRA ALLERGY) 180 MG tablet Take 1 tablet (180 mg total) by mouth daily.   fluticasone (FLONASE) 50 MCG/ACT nasal spray Place 2 sprays into both nostrils daily. USE 2 SPRAYS IN EACH NOSTRIL DAILY AS NEEDED FOR ALLERGIES OR RHINITIS   fluticasone (FLOVENT HFA) 110 MCG/ACT inhaler Inhale 2 puffs into the lungs 2 (two) times daily.   furosemide (LASIX) 40 MG tablet TAKE 1 TABLET DAILY   gabapentin (NEURONTIN) 300 MG capsule Take 1 capsule (300 mg total) by mouth at bedtime.   guaiFENesin (MUCINEX) 600 MG 12 hr tablet Take 1,200 mg by mouth 2 (two) times daily as needed for to  loosen phlegm.    ipratropium-albuterol (DUONEB) 0.5-2.5 (3) MG/3ML SOLN USE THREE TIMES A DAY FOR NEXT 3 DAYS AND THEN AS NEEDED FOR WHEEZING.   losartan (COZAAR) 25 MG tablet Take 1 tablet (25 mg total) by mouth daily.   metoprolol succinate (TOPROL-XL) 50 MG 24 hr tablet Take 1 tablet (50 mg total) by mouth daily. Take with or immediately following a meal.   mupirocin cream (BACTROBAN) 2 % Apply 1 application topically 2 (two) times daily. Apply to the nose twice a day x 1 week   pantoprazole (PROTONIX) 40 MG tablet TAKE 1 TABLET TWICE A DAY BEFORE MEALS   sucralfate (CARAFATE) 1 g tablet TAKE 1 TABLET IN THE MORNING, AT NOON, IN THE EVENING AND AT BEDTIME   SYNTHROID 50 MCG tablet Take 1 tablet (50 mcg total) by mouth daily before breakfast.   ULORIC 40 MG tablet Take 1 tablet (40 mg total) by mouth every other day.   [DISCONTINUED] propranolol ER (INDERAL LA) 80 MG 24 hr capsule Take 1 capsule (80 mg total) by mouth daily.     Allergies:    Simvastatin, Crestor [rosuvastatin], Lipitor [atorvastatin], Red dye, and Statins   Social History: Social History   Socioeconomic History   Marital status: Widowed    Spouse name: Not on file   Number of children: 3   Years of education: Not on file   Highest education level: Not on file  Occupational History   Occupation: Retired   Tobacco Use   Smoking status: Former    Years: 40.00    Types: Cigarettes    Quit date: 11/12/1988    Years since quitting: 32.8   Smokeless tobacco: Never  Vaping Use   Vaping Use: Never used  Substance and Sexual Activity   Alcohol use: Yes    Alcohol/week: 4.0 standard drinks    Types: 4 Standard drinks or equivalent per week    Comment: occasional wine    Drug use: No   Sexual activity: Never    Comment: lives alone, no dietary restrictions, widowed  Other Topics Concern   Not on file  Social History Narrative   Not on file   Social Determinants of Health   Financial Resource Strain: Not on  file  Food Insecurity: Not on file  Transportation Needs: Not on file  Physical Activity: Not on file  Stress: Not on file  Social Connections: Not on file     Family History: The patient's family history includes Aneurysm in her daughter; Cancer in her sister; Diabetes in her daughter; Heart attack in her father; Heart disease in her brother, daughter, father,  and sister; Hip fracture in her mother; Hyperlipidemia in her daughter and father; Hypertension in her daughter; Other in her mother. There is no history of Colon cancer.  ROS:   All other ROS reviewed and negative. Pertinent positives noted in the HPI.     EKGs/Labs/Other Studies Reviewed:   The following studies were personally reviewed by me today:  NM Stress 07/03/2021   The study is normal. Findings are consistent with no prior ischemia and no prior myocardial infarction. The study is low risk.   No ST deviation was noted.   LV perfusion is normal. There is no evidence of inducible ischemia and infarction.   Nuclear stress EF: 89 %. Left ventricular function is normal.  Recent Labs: 08/10/2021: ALT 7; BUN 16; Creatinine, Ser 1.18; Hemoglobin 12.7; Platelets 239.0; Potassium 5.1; Sodium 143; TSH 2.50   Recent Lipid Panel    Component Value Date/Time   CHOL 228 (H) 08/10/2021 1604   TRIG 216.0 (H) 08/10/2021 1604   HDL 56.90 08/10/2021 1604   CHOLHDL 4 08/10/2021 1604   VLDL 43.2 (H) 08/10/2021 1604   LDLCALC 103 (H) 04/25/2021 1154   LDLCALC 116 (H) 09/13/2020 1044   LDLDIRECT 125.0 08/10/2021 1604    Physical Exam:   VS:  BP 118/70 (BP Location: Left Arm, Patient Position: Sitting, Cuff Size: Normal)   Pulse 79   Resp 20   Ht 5\' 2"  (1.575 m)   Wt 145 lb (65.8 kg)   SpO2 95%   BMI 26.52 kg/m    Wt Readings from Last 3 Encounters:  09/25/21 145 lb (65.8 kg)  09/19/21 145 lb (65.8 kg)  08/10/21 147 lb (66.7 kg)    General: Well nourished, well developed, in no acute distress Head: Atraumatic, normal size   Eyes: PEERLA, EOMI  Neck: Supple, no JVD Endocrine: No thryomegaly Cardiac: Normal S1, S2; RRR; no murmurs, rubs, or gallops Lungs: Clear to auscultation bilaterally, no wheezing, rhonchi or rales  Abd: Soft, nontender, no hepatomegaly  Ext: No edema, pulses 2+ Musculoskeletal: No deformities, BUE and BLE strength normal and equal Skin: Warm and dry, no rashes   Neuro: Alert and oriented to person, place, time, and situation, CNII-XII grossly intact, no focal deficits  Psych: Normal mood and affect   ASSESSMENT:   Christina Mckinney is a 87 y.o. female who presents for the following: 1. Bilateral carotid artery stenosis   2. Mixed hyperlipidemia   3. Palpitations    PLAN:   1. Bilateral carotid artery stenosis 2. Mixed hyperlipidemia -Minimal carotid artery disease.  She reports she cannot take statins or Zetia.  Not wanting to try any other medications.  For now we will just continue aspirin 81 mg daily.  Likely will recheck ultrasound in 1 to 2 years. -Recent nuclear medicine stress test normal.  No evidence of blockages.  3. Palpitations -She continues to report daily episodes of palpitations.  Recent monitor was normal.  Echo and stress test have all been normal.  I informed her that I not believe this is related to her heart.  She would like to try different medication to see if this helps.  We will stop her propranolol and start her on metoprolol succinate 50 mg daily.  She will let us know if this does not help.  We may need to reconsider a repeat heart monitor.  She will let us know.   Disposition: Return in about 6 months (around 03/25/2022).  Medication Adjustments/Labs and Tests Ordered: Current medicines are  reviewed at length with the patient today.  Concerns regarding medicines are outlined above.  No orders of the defined types were placed in this encounter.  Meds ordered this encounter  Medications   metoprolol succinate (TOPROL-XL) 50 MG 24 hr tablet    Sig: Take 1  tablet (50 mg total) by mouth daily. Take with or immediately following a meal.    Dispense:  90 tablet    Refill:  3    Patient Instructions  Medication Instructions:  STOP propranolol  START Metoprolol Succinate 50 mg daily   *If you need a refill on your cardiac medications before your next appointment, please call your pharmacy*   Follow-Up: At [facility], you and your health needs are our priority.  As part of our continuing mission to provide you with exceptional heart care, we have created designated Provider Care Teams.  These Care Teams include your primary Cardiologist (physician) and Advanced Practice Providers (APPs -  Physician Assistants and Nurse Practitioners) who all work together to provide you with the care you need, when you need it.  We recommend signing up for the patient portal called "MyChart".  Sign up information is provided on this After Visit Summary.  MyChart is used to connect with patients for Virtual Visits (Telemedicine).  Patients are able to view lab/test results, encounter notes, upcoming appointments, etc.  Non-urgent messages can be sent to your provider as well.   To learn more about what you can do with MyChart, go to [email or web address].    Your next appointment:   6 month(s)  The format for your next appointment:   In Person  Provider:   Evalina Field, MD      Time Spent with Patient: I have spent a total of 35 minutes with patient reviewing hospital notes, telemetry, EKGs, labs and examining the patient as well as establishing an assessment and plan that was discussed with the patient.  > 50% of time was spent in direct patient care.  Signed, Addison Naegeli. Audie Box, MD, [place]  [address], [place], [place] [address] [phone]  09/25/2021 2:20 PM

## 2021-09-25 ENCOUNTER — Encounter: Payer: Self-pay | Admitting: Cardiovascular Disease

## 2021-09-25 ENCOUNTER — Other Ambulatory Visit: Payer: Self-pay

## 2021-09-25 ENCOUNTER — Ambulatory Visit (INDEPENDENT_AMBULATORY_CARE_PROVIDER_SITE_OTHER): Payer: Medicare Other | Admitting: Cardiovascular Disease

## 2021-09-25 VITALS — BP 118/70 | HR 79 | Resp 20 | Ht 62.0 in | Wt 145.0 lb

## 2021-09-25 DIAGNOSIS — E782 Mixed hyperlipidemia: Secondary | ICD-10-CM

## 2021-09-25 DIAGNOSIS — I6523 Occlusion and stenosis of bilateral carotid arteries: Secondary | ICD-10-CM | POA: Diagnosis not present

## 2021-09-25 DIAGNOSIS — R002 Palpitations: Secondary | ICD-10-CM | POA: Diagnosis not present

## 2021-09-25 MED ORDER — METOPROLOL SUCCINATE ER 50 MG PO TB24: 50.0000 mg | ORAL_TABLET | Freq: Every day | ORAL | 3 refills | Status: DC

## 2021-09-25 NOTE — Patient Instructions (Signed)
Medication Instructions:  STOP propranolol  START Metoprolol Succinate 50 mg daily   *If you need a refill on your cardiac medications before your next appointment, please call your pharmacy*   Follow-Up: At [facility], you and your health needs are our priority.  As part of our continuing mission to provide you with exceptional heart care, we have created designated Provider Care Teams.  These Care Teams include your primary Cardiologist (physician) and Advanced Practice Providers (APPs -  Physician Assistants and Nurse Practitioners) who all work together to provide you with the care you need, when you need it.  We recommend signing up for the patient portal called "MyChart".  Sign up information is provided on this After Visit Summary.  MyChart is used to connect with patients for Virtual Visits (Telemedicine).  Patients are able to view lab/test results, encounter notes, upcoming appointments, etc.  Non-urgent messages can be sent to your provider as well.   To learn more about what you can do with MyChart, go to [email or web address].    Your next appointment:   6 month(s)  The format for your next appointment:   In Person  Provider:   Evalina Field, MD

## 2021-12-18 ENCOUNTER — Ambulatory Visit (INDEPENDENT_AMBULATORY_CARE_PROVIDER_SITE_OTHER): Payer: Medicare Other | Admitting: Family Medicine

## 2021-12-18 ENCOUNTER — Encounter: Payer: Self-pay | Admitting: Family Medicine

## 2021-12-18 VITALS — BP 110/62 | HR 74 | Temp 98.1°F | Resp 16

## 2021-12-18 DIAGNOSIS — R739 Hyperglycemia, unspecified: Secondary | ICD-10-CM

## 2021-12-18 DIAGNOSIS — R002 Palpitations: Secondary | ICD-10-CM | POA: Diagnosis not present

## 2021-12-18 DIAGNOSIS — E785 Hyperlipidemia, unspecified: Secondary | ICD-10-CM

## 2021-12-18 DIAGNOSIS — I10 Essential (primary) hypertension: Secondary | ICD-10-CM | POA: Diagnosis not present

## 2021-12-18 DIAGNOSIS — B80 Enterobiasis: Secondary | ICD-10-CM | POA: Insufficient documentation

## 2021-12-18 DIAGNOSIS — R350 Frequency of micturition: Secondary | ICD-10-CM

## 2021-12-18 MED ORDER — MEBENDAZOLE 100 MG PO CHEW: CHEWABLE_TABLET | ORAL | 0 refills | Status: DC

## 2021-12-18 MED ORDER — METOPROLOL SUCCINATE ER 100 MG PO TB24: 100.0000 mg | ORAL_TABLET | Freq: Every day | ORAL | 1 refills | Status: DC

## 2021-12-18 NOTE — Assessment & Plan Note (Signed)
Patient has been taking Metoprolol, propranolol and not taking Cozaar. Her Metoprolol XL is increased to 100 mg daily and Propranolol is stopped. She will restart Cozaar and we will monitor labs

## 2021-12-18 NOTE — Assessment & Plan Note (Signed)
She notes they are occurring more at night and lasting longer. She is requesting a referral back to her previous cardiologist Dr Martinique for evaluation. Referral is placed and she is advised to consider purchasing a Cartia mobile to monitor her heart rate and rhythm.

## 2021-12-18 NOTE — Assessment & Plan Note (Signed)
Her grandchildren have been diagnosed and she has seen them herself she is given a course of Vermox and consideration is given to ID referral if persists

## 2021-12-18 NOTE — Patient Instructions (Addendum)
7000 steps with 1/2 hour of moderate intensity activity or 10000 steps a day Increase the Toprol to 100 mg daily Stop the Propranolol/Inderal Take the Espy for your heart to monitor, can buy on online Hypertension, Adult High blood pressure (hypertension) is when the force of blood pumping through the arteries is too strong. The arteries are the blood vessels that carry blood from the heart throughout the body. Hypertension forces the heart to work harder to pump blood and may cause arteries to become narrow or stiff. Untreated or uncontrolled hypertension can cause a heart attack, heart failure, a stroke, kidney disease, and other problems. A blood pressure reading consists of a higher number over a lower number. Ideally, your blood pressure should be below 120/80. The first ("top") number is called the systolic pressure. It is a measure of the pressure in your arteries as your heart beats. The second ("bottom") number is called the diastolic pressure. It is a measure of the pressure in your arteries as the heart relaxes. What are the causes? The exact cause of this condition is not known. There are some conditions that result in or are related to high blood pressure. What increases the risk? Some risk factors for high blood pressure are under your control. The following factors may make you more likely to develop this condition: Smoking. Having type 2 diabetes mellitus, high cholesterol, or both. Not getting enough exercise or physical activity. Being overweight. Having too much fat, sugar, calories, or salt (sodium) in your diet. Drinking too much alcohol. Some risk factors for high blood pressure may be difficult or impossible to change. Some of these factors include: Having chronic kidney disease. Having a family history of high blood pressure. Age. Risk increases with age. Race. You may be at higher risk if you are African American. Gender. Men are at higher risk than  women before age 74. After age 30, women are at higher risk than men. Having obstructive sleep apnea. Stress. What are the signs or symptoms? High blood pressure may not cause symptoms. Very high blood pressure (hypertensive crisis) may cause: Headache. Anxiety. Shortness of breath. Nosebleed. Nausea and vomiting. Vision changes. Severe chest pain. Seizures. How is this diagnosed? This condition is diagnosed by measuring your blood pressure while you are seated, with your arm resting on a flat surface, your legs uncrossed, and your feet flat on the floor. The cuff of the blood pressure monitor will be placed directly against the skin of your upper arm at the level of your heart. It should be measured at least twice using the same arm. Certain conditions can cause a difference in blood pressure between your right and left arms. Certain factors can cause blood pressure readings to be lower or higher than normal for a short period of time: When your blood pressure is higher when you are in a health care provider's office than when you are at home, this is called white coat hypertension. Most people with this condition do not need medicines. When your blood pressure is higher at home than when you are in a health care provider's office, this is called masked hypertension. Most people with this condition may need medicines to control blood pressure. If you have a high blood pressure reading during one visit or you have normal blood pressure with other risk factors, you may be asked to: Return on a different day to have your blood pressure checked again. Monitor your blood pressure at home for 1 week  or longer. If you are diagnosed with hypertension, you may have other blood or imaging tests to help your health care provider understand your overall risk for other conditions. How is this treated? This condition is treated by making healthy lifestyle changes, such as eating healthy foods, exercising  more, and reducing your alcohol intake. Your health care provider may prescribe medicine if lifestyle changes are not enough to get your blood pressure under control, and if: Your systolic blood pressure is above 130. Your diastolic blood pressure is above 80. Your personal target blood pressure may vary depending on your medical conditions, your age, and other factors. Follow these instructions at home: Eating and drinking  Eat a diet that is high in fiber and potassium, and low in sodium, added sugar, and fat. An example eating plan is called the DASH (Dietary Approaches to Stop Hypertension) diet. To eat this way: Eat plenty of fresh fruits and vegetables. Try to fill one half of your plate at each meal with fruits and vegetables. Eat whole grains, such as whole-wheat pasta, brown rice, or whole-grain bread. Fill about one fourth of your plate with whole grains. Eat or drink low-fat dairy products, such as skim milk or low-fat yogurt. Avoid fatty cuts of meat, processed or cured meats, and poultry with skin. Fill about one fourth of your plate with lean proteins, such as fish, chicken without skin, beans, eggs, or tofu. Avoid pre-made and processed foods. These tend to be higher in sodium, added sugar, and fat. Reduce your daily sodium intake. Most people with hypertension should eat less than 1,500 mg of sodium a day. Do not drink alcohol if: Your health care provider tells you not to drink. You are pregnant, may be pregnant, or are planning to become pregnant. If you drink alcohol: Limit how much you use to: 0-1 drink a day for women. 0-2 drinks a day for men. Be aware of how much alcohol is in your drink. In the [place] one drink equals one 12 oz bottle of beer (355 mL), one 5 oz glass of wine (148 mL), or one 1 oz glass of hard liquor (44 mL). Lifestyle  Work with your health care provider to maintain a healthy body weight or to lose weight. Ask what an ideal weight is for you. Get at  least 30 minutes of exercise most days of the week. Activities may include walking, swimming, or biking. Include exercise to strengthen your muscles (resistance exercise), such as Pilates or lifting weights, as part of your weekly exercise routine. Try to do these types of exercises for 30 minutes at least 3 days a week. Do not use any products that contain nicotine or tobacco, such as cigarettes, e-cigarettes, and chewing tobacco. If you need help quitting, ask your health care provider. Monitor your blood pressure at home as told by your health care provider. Keep all follow-up visits as told by your health care provider. This is important. Medicines Take over-the-counter and prescription medicines only as told by your health care provider. Follow directions carefully. Blood pressure medicines must be taken as prescribed. Do not skip doses of blood pressure medicine. Doing this puts you at risk for problems and can make the medicine less effective. Ask your health care provider about side effects or reactions to medicines that you should watch for. Contact a health care provider if you: Think you are having a reaction to a medicine you are taking. Have headaches that keep coming back (recurring). Feel dizzy. Have swelling  in your ankles. Have trouble with your vision. Get help right away if you: Develop a severe headache or confusion. Have unusual weakness or numbness. Feel faint. Have severe pain in your chest or abdomen. Vomit repeatedly. Have trouble breathing. Summary Hypertension is when the force of blood pumping through your arteries is too strong. If this condition is not controlled, it may put you at risk for serious complications. Your personal target blood pressure may vary depending on your medical conditions, your age, and other factors. For most people, a normal blood pressure is less than 120/80. Hypertension is treated with lifestyle changes, medicines, or a combination of  both. Lifestyle changes include losing weight, eating a healthy, low-sodium diet, exercising more, and limiting alcohol. This information is not intended to replace advice given to you by your health care provider. Make sure you discuss any questions you have with your health care provider. Document Revised: 07/09/2018 Document Reviewed: 07/09/2018 Elsevier Patient Education  [place].

## 2021-12-18 NOTE — Progress Notes (Signed)
Subjective:   By signing my name below, I, Christina Mckinney, attest that this documentation has been prepared under the direction and in the presence of Mosie Lukes, MD. 12/18/2021       Patient ID: Christina Mckinney, female    DOB: 01-12-1932, 88 y.o.   MRN: [id number]  Chief Complaint  Patient presents with   4 months follow up    HPI Patient is in today for an office visit.  She reports of frequent palpitations that happen more often when she is laying down. She was given a monitor by her cardiologist but she did not know how to use it so results were incorrect.  She would like to return to Dr Martinique. She is using 50 mg metoprolol to manage the palpitations.   She is complaining of dry skin on her feet and thinks it is due to the 40 mg uloric.  She reports she has been feeling very dizzy for the past few days. She would like to return to the neurologist.   She thinks she has been getting pinworm infections from her grandchildren. Notes it has been going on for a couple of years. She has been using OTC medication but it did not help.   Past Medical History:  Diagnosis Date   Anemia, unspecified    Anxiety and depression 09/18/2015   Anxiety state, unspecified    Chest pain    Chronic systolic CHF (congestive heart failure) ([place]) 08/12/2015   Depression with anxiety 06/02/2007   Qualifier: Diagnosis of  By: Wynona Luna    Depressive disorder, not elsewhere classified    Diverticulitis    DVT (deep venous thrombosis) ([place])    Dyspnea 07/22/2015   Edema 06/12/2014   Essential and other specified forms of tremor    GERD (gastroesophageal reflux disease)    Gout, unspecified    Internal hemorrhoids without mention of complication    Macular degeneration 08/07/2015   Osteoarthritis    Other abnormal glucose    Other and unspecified hyperlipidemia    Personal history of colonic polyps    Personal history of peptic ulcer disease    Pulmonary embolism ([place])    Unspecified disorder  of bladder    Unspecified essential hypertension    Unspecified hypothyroidism    Varicose veins     Past Surgical History:  Procedure Laterality Date   ABDOMINAL HYSTERECTOMY     CHOLECYSTECTOMY     ENDOVENOUS ABLATION SAPHENOUS VEIN W/ LASER  09-11-2012   left greater saphenous vein   by Curt Jews MD   EYE SURGERY     KNEE ARTHROSCOPY     right    NASAL SEPTUM SURGERY     SHOULDER SURGERY     TOTAL KNEE ARTHROPLASTY  09/2013   Right    Family History  Problem Relation Age of Onset   Other Mother        varicose veins   Hip fracture Mother    Heart disease Father        CHF   Hyperlipidemia Father    Heart attack Father    Cancer Sister        stomach   Heart disease Sister    Heart disease Brother    Diabetes Daughter    Heart disease Daughter    Hyperlipidemia Daughter    Hypertension Daughter    Aneurysm Daughter    Colon cancer Neg Hx     Social History   Socioeconomic  History   Marital status: Widowed    Spouse name: Not on file   Number of children: 3   Years of education: Not on file   Highest education level: Not on file  Occupational History   Occupation: Retired   Tobacco Use   Smoking status: Former    Years: 40.00    Types: Cigarettes    Quit date: 11/12/1988    Years since quitting: 33.1   Smokeless tobacco: Never  Vaping Use   Vaping Use: Never used  Substance and Sexual Activity   Alcohol use: Yes    Alcohol/week: 4.0 standard drinks    Types: 4 Standard drinks or equivalent per week    Comment: occasional wine    Drug use: No   Sexual activity: Never    Comment: lives alone, no dietary restrictions, widowed  Other Topics Concern   Not on file  Social History Narrative   Not on file   Social Determinants of Health   Financial Resource Strain: Not on file  Food Insecurity: Not on file  Transportation Needs: Not on file  Physical Activity: Not on file  Stress: Not on file  Social Connections: Not on file  Intimate Partner  Violence: Not on file    Outpatient Medications Prior to Visit  Medication Sig Dispense Refill   albuterol (VENTOLIN HFA) 108 (90 Base) MCG/ACT inhaler USE 2 INHALATIONS Mckinney 6 HOURS AS NEEDED FOR WHEEZING OR SHORTNESS OF BREATH 51 g 3   aspirin 81 MG tablet Take 1 tablet (81 mg total) by mouth daily. 30 tablet    co-enzyme Q-10 30 MG capsule Take 30 mg by mouth daily.     fexofenadine (ALLEGRA ALLERGY) 180 MG tablet Take 1 tablet (180 mg total) by mouth daily. 90 tablet 1   fluticasone (FLONASE) 50 MCG/ACT nasal spray Place 2 sprays into both nostrils daily. USE 2 SPRAYS IN EACH NOSTRIL DAILY AS NEEDED FOR ALLERGIES OR RHINITIS 16 g 5   fluticasone (FLOVENT HFA) 110 MCG/ACT inhaler Inhale 2 puffs into the lungs 2 (two) times daily. 3 Inhaler 1   furosemide (LASIX) 40 MG tablet TAKE 1 TABLET DAILY 90 tablet 3   gabapentin (NEURONTIN) 300 MG capsule Take 1 capsule (300 mg total) by mouth at bedtime. 90 capsule 1   pantoprazole (PROTONIX) 40 MG tablet TAKE 1 TABLET TWICE A DAY BEFORE MEALS 180 tablet 3   sucralfate (CARAFATE) 1 g tablet TAKE 1 TABLET IN THE MORNING, AT NOON, IN THE EVENING AND AT BEDTIME 360 tablet 3   SYNTHROID 50 MCG tablet Take 1 tablet (50 mcg total) by mouth daily before breakfast. 90 tablet 1   metoprolol succinate (TOPROL-XL) 50 MG 24 hr tablet Take 1 tablet (50 mg total) by mouth daily. Take with or immediately following a meal. 90 tablet 3   ULORIC 40 MG tablet Take 1 tablet (40 mg total) by mouth Mckinney other day. 90 tablet 1   guaiFENesin (MUCINEX) 600 MG 12 hr tablet Take 1,200 mg by mouth 2 (two) times daily as needed for to loosen phlegm.      ipratropium-albuterol (DUONEB) 0.5-2.5 (3) MG/3ML SOLN USE THREE TIMES A DAY FOR NEXT 3 DAYS AND THEN AS NEEDED FOR WHEEZING. 60 mL 8   losartan (COZAAR) 25 MG tablet Take 1 tablet (25 mg total) by mouth daily. (Patient not taking: Reported on 12/18/2021) 90 tablet 3   mupirocin cream (BACTROBAN) 2 % Apply 1 application topically  2 (two) times daily. Apply to the  nose twice a day x 1 week (Patient not taking: Reported on 12/18/2021) 15 g 1   No facility-administered medications prior to visit.    Allergies  Allergen Reactions   Simvastatin Other (See Comments)    Hip pain   Crestor [Rosuvastatin]     Left hip pain   Lipitor [Atorvastatin]     Pain in hips   Red Dye     Hip pain   Statins Other (See Comments)    Muscle aches, could not walk    Review of Systems  Constitutional:  Negative for fever and malaise/fatigue.  HENT:  Negative for congestion.   Eyes:  Negative for redness.  Respiratory:  Negative for shortness of breath and wheezing.   Cardiovascular:  Positive for palpitations. Negative for chest pain and leg swelling.  Gastrointestinal:  Negative for abdominal pain, blood in stool and nausea.  Genitourinary:  Negative for dysuria and frequency.  Musculoskeletal:  Negative for falls.  Skin:  Negative for rash.       (+) dry flaky skin  Neurological:  Positive for dizziness. Negative for loss of consciousness and headaches.  Endo/Heme/Allergies:  Negative for polydipsia.  Psychiatric/Behavioral:  Negative for depression. The patient is not nervous/anxious.       Objective:    Physical Exam Constitutional:      General: She is not in acute distress.    Appearance: She is well-developed.  HENT:     Head: Normocephalic and atraumatic.  Eyes:     Conjunctiva/sclera: Conjunctivae normal.  Neck:     Thyroid: No thyromegaly.  Cardiovascular:     Rate and Rhythm: Normal rate and regular rhythm.     Heart sounds: Normal heart sounds. No murmur heard. Pulmonary:     Effort: Pulmonary effort is normal. No respiratory distress.     Breath sounds: Normal breath sounds.  Abdominal:     General: Bowel sounds are normal. There is no distension.     Palpations: Abdomen is soft. There is no mass.     Tenderness: There is no abdominal tenderness.  Musculoskeletal:     Cervical back: Neck supple.   Lymphadenopathy:     Cervical: No cervical adenopathy.  Skin:    General: Skin is warm and dry.  Neurological:     Mental Status: She is alert and oriented to person, place, and time.  Psychiatric:        Behavior: Behavior normal.    BP 110/62    Pulse 74    Temp 98.1 F (36.7 C)    Resp 16    SpO2 92%  Wt Readings from Last 3 Encounters:  09/25/21 145 lb (65.8 kg)  09/19/21 145 lb (65.8 kg)  08/10/21 147 lb (66.7 kg)    Diabetic Foot Exam - Simple   No data filed    Lab Results  Component Value Date   WBC 9.1 08/10/2021   HGB 12.7 08/10/2021   HCT 38.9 08/10/2021   PLT 239.0 08/10/2021   GLUCOSE 97 08/10/2021   CHOL 228 (H) 08/10/2021   TRIG 216.0 (H) 08/10/2021   HDL 56.90 08/10/2021   LDLDIRECT 125.0 08/10/2021   LDLCALC 103 (H) 04/25/2021   ALT 7 08/10/2021   AST 21 08/10/2021   NA 143 08/10/2021   K 5.1 08/10/2021   CL 104 08/10/2021   CREATININE 1.18 08/10/2021   BUN 16 08/10/2021   CO2 30 08/10/2021   TSH 2.50 08/10/2021   INR 1.0 07/21/2010   HGBA1C 5.7  08/10/2021   MICROALBUR 0.9 01/31/2016    Lab Results  Component Value Date   TSH 2.50 08/10/2021   Lab Results  Component Value Date   WBC 9.1 08/10/2021   HGB 12.7 08/10/2021   HCT 38.9 08/10/2021   MCV 97.2 08/10/2021   PLT 239.0 08/10/2021   Lab Results  Component Value Date   NA 143 08/10/2021   K 5.1 08/10/2021   CO2 30 08/10/2021   GLUCOSE 97 08/10/2021   BUN 16 08/10/2021   CREATININE 1.18 08/10/2021   BILITOT 0.6 08/10/2021   ALKPHOS 84 08/10/2021   AST 21 08/10/2021   ALT 7 08/10/2021   PROT 7.4 08/10/2021   ALBUMIN 4.4 08/10/2021   CALCIUM 10.7 (H) 08/10/2021   ANIONGAP 9 12/21/2018   GFR 41.51 (L) 08/10/2021   Lab Results  Component Value Date   CHOL 228 (H) 08/10/2021   Lab Results  Component Value Date   HDL 56.90 08/10/2021   Lab Results  Component Value Date   LDLCALC 103 (H) 04/25/2021   Lab Results  Component Value Date   TRIG 216.0 (H) 08/10/2021    Lab Results  Component Value Date   CHOLHDL 4 08/10/2021   Lab Results  Component Value Date   HGBA1C 5.7 08/10/2021       Assessment & Plan:   Problem List Items Addressed This Visit     Hyperlipidemia   Relevant Medications   metoprolol succinate (TOPROL-XL) 100 MG 24 hr tablet   Other Relevant Orders   CBC with Differential/Platelet   Comprehensive metabolic panel   TSH   Lipid panel   Primary hypertension    Patient has been taking Metoprolol, propranolol and not taking Cozaar. Her Metoprolol XL is increased to 100 mg daily and Propranolol is stopped. She will restart Cozaar and we will monitor labs      Relevant Medications   metoprolol succinate (TOPROL-XL) 100 MG 24 hr tablet   Other Relevant Orders   CBC with Differential/Platelet   Comprehensive metabolic panel   TSH   Lipid panel   Palpitations    She notes they are occurring more at night and lasting longer. She is requesting a referral back to her previous cardiologist Dr Martinique for evaluation. Referral is placed and she is advised to consider purchasing a [employer]ia mobile to monitor her heart rate and rhythm.       Relevant Orders   Ambulatory referral to Cardiology   Pinworm infection    Her grandchildren have been diagnosed and she has seen them herself she is given a course of Vermox and consideration is given to ID referral if persists      Other Visit Diagnoses     Hyperglycemia    -  Primary   Relevant Orders   Hemoglobin A1c   Urinary frequency       Relevant Orders   Urinalysis   Urine Culture        Meds ordered this encounter  Medications   metoprolol succinate (TOPROL-XL) 100 MG 24 hr tablet    Sig: Take 1 tablet (100 mg total) by mouth daily. Take with or immediately following a meal.    Dispense:  90 tablet    Refill:  1   mebendazole (VERMOX) 100 MG chewable tablet    Sig: 1 tab Mckinney other week x 2 doses    Dispense:  2 tablet    Refill:  0    I,Christina Mckinney,acting as a  scribe for  [name], MD.,have documented all relevant documentation on the behalf of [name], MD,as directed by  [name], MD while in the presence of [name], MD.   I, Mosie Lukes, MD., personally preformed the services described in this documentation.  All medical record entries made by the scribe were at my direction and in my presence.  I have reviewed the chart and discharge instructions (if applicable) and agree that the record reflects my personal performance and is accurate and complete. 12/18/2021

## 2021-12-19 LAB — LIPID PANEL
Cholesterol: 202 mg/dL — ABNORMAL HIGH (ref 0–200)
HDL: 52.7 mg/dL (ref >39.00)
NonHDL: 149.74
Total CHOL/HDL Ratio: 4
Triglycerides: 203 mg/dL — ABNORMAL HIGH (ref 0.0–149.0)
VLDL: 40.6 mg/dL — ABNORMAL HIGH (ref 0.0–40.0)

## 2021-12-19 LAB — URINALYSIS, ROUTINE W REFLEX MICROSCOPIC
Bilirubin Urine: NEGATIVE
Hgb urine dipstick: NEGATIVE
Ketones, ur: NEGATIVE
Nitrite: NEGATIVE
RBC / HPF: NONE SEEN (ref 0–?)
Specific Gravity, Urine: 1.01 (ref 1.000–1.030)
Total Protein, Urine: NEGATIVE
Urine Glucose: NEGATIVE
Urobilinogen, UA: 0.2 (ref 0.0–1.0)
pH: 6 (ref 5.0–8.0)

## 2021-12-19 LAB — CBC WITH DIFFERENTIAL/PLATELET
Basophils Absolute: 0.1 10*3/uL (ref 0.0–0.1)
Basophils Relative: 1.1 % (ref 0.0–3.0)
Eosinophils Absolute: 0.5 10*3/uL (ref 0.0–0.7)
Eosinophils Relative: 5.7 % — ABNORMAL HIGH (ref 0.0–5.0)
HCT: 40.6 % (ref 36.0–46.0)
Hemoglobin: 13.1 g/dL (ref 12.0–15.0)
Lymphocytes Relative: 25.6 % (ref 12.0–46.0)
Lymphs Abs: 2.1 10*3/uL (ref 0.7–4.0)
MCHC: 32.3 g/dL (ref 30.0–36.0)
MCV: 96.3 fl (ref 78.0–100.0)
Monocytes Absolute: 0.5 10*3/uL (ref 0.1–1.0)
Monocytes Relative: 6.3 % (ref 3.0–12.0)
Neutro Abs: 5.1 10*3/uL (ref 1.4–7.7)
Neutrophils Relative %: 61.3 % (ref 43.0–77.0)
Platelets: 230 10*3/uL (ref 150.0–400.0)
RBC: 4.21 Mil/uL (ref 3.87–5.11)
RDW: 14.1 % (ref 11.5–15.5)
WBC: 8.3 10*3/uL (ref 4.0–10.5)

## 2021-12-19 LAB — COMPREHENSIVE METABOLIC PANEL
ALT: 6 U/L (ref 0–35)
AST: 22 U/L (ref 0–37)
Albumin: 4.4 g/dL (ref 3.5–5.2)
Alkaline Phosphatase: 80 U/L (ref 39–117)
BUN: 16 mg/dL (ref 6–23)
CO2: 34 mEq/L — ABNORMAL HIGH (ref 19–32)
Calcium: 10.7 mg/dL — ABNORMAL HIGH (ref 8.4–10.5)
Chloride: 100 mEq/L (ref 96–112)
Creatinine, Ser: 1.18 mg/dL (ref 0.40–1.20)
GFR: 41.41 mL/min — ABNORMAL LOW (ref >60.00)
Glucose, Bld: 105 mg/dL — ABNORMAL HIGH (ref 70–99)
Potassium: 4.1 mEq/L (ref 3.5–5.1)
Sodium: 140 mEq/L (ref 135–145)
Total Bilirubin: 0.6 mg/dL (ref 0.2–1.2)
Total Protein: 7.7 g/dL (ref 6.0–8.3)

## 2021-12-19 LAB — LDL CHOLESTEROL, DIRECT: Direct LDL: 104 mg/dL

## 2021-12-19 LAB — TSH: TSH: 2.07 u[IU]/mL (ref 0.35–5.50)

## 2021-12-19 LAB — HEMOGLOBIN A1C: Hgb A1c MFr Bld: 5.8 % (ref 4.6–6.5)

## 2021-12-20 ENCOUNTER — Other Ambulatory Visit: Payer: Self-pay | Admitting: Family Medicine

## 2021-12-20 ENCOUNTER — Telehealth: Payer: Self-pay | Admitting: Cardiovascular Disease

## 2021-12-20 LAB — URINE CULTURE
MICRO NUMBER:: 12967983
SPECIMEN QUALITY:: ADEQUATE

## 2021-12-20 MED ORDER — AMOXICILLIN-POT CLAVULANATE 875-125 MG PO TABS: 1.0000 | ORAL_TABLET | Freq: Two times a day (BID) | ORAL | 0 refills | Status: DC

## 2021-12-20 NOTE — Telephone Encounter (Signed)
Patient requesting to switch from Dr. Audie Box to Dr. Martinique.

## 2021-12-21 ENCOUNTER — Other Ambulatory Visit: Payer: Self-pay

## 2021-12-21 MED ORDER — AMOXICILLIN-POT CLAVULANATE 875-125 MG PO TABS: 1.0000 | ORAL_TABLET | Freq: Two times a day (BID) | ORAL | 0 refills | Status: DC

## 2022-02-05 DIAGNOSIS — H04123 Dry eye syndrome of bilateral lacrimal glands: Secondary | ICD-10-CM | POA: Diagnosis not present

## 2022-02-05 DIAGNOSIS — Z961 Presence of intraocular lens: Secondary | ICD-10-CM | POA: Diagnosis not present

## 2022-02-06 ENCOUNTER — Telehealth: Payer: Self-pay | Admitting: Family Medicine

## 2022-02-06 ENCOUNTER — Other Ambulatory Visit: Payer: Self-pay

## 2022-02-06 DIAGNOSIS — M109 Gout, unspecified: Secondary | ICD-10-CM

## 2022-02-06 MED ORDER — LOSARTAN POTASSIUM 25 MG PO TABS: 25.0000 mg | ORAL_TABLET | Freq: Every day | ORAL | 1 refills | Status: DC

## 2022-02-06 MED ORDER — ALPRAZOLAM 0.5 MG PO TABS: 0.5000 mg | ORAL_TABLET | Freq: Every evening | ORAL | 0 refills | Status: DC | PRN

## 2022-02-06 MED ORDER — GABAPENTIN 300 MG PO CAPS: 300.0000 mg | ORAL_CAPSULE | Freq: Every day | ORAL | 1 refills | Status: DC

## 2022-02-06 MED ORDER — SYNTHROID 50 MCG PO TABS: 50.0000 ug | ORAL_TABLET | Freq: Every day | ORAL | 1 refills | Status: DC

## 2022-02-06 NOTE — Telephone Encounter (Signed)
Refills on synthroid, losartan and [facility]. Request for xanax sent to PCP. ?

## 2022-02-06 NOTE — Telephone Encounter (Signed)
Requesting: alprazolam 0.'5MG'$  ?Contract: n/a ?UDS: n/a ?Last Visit: 12/18/2021 ?Next Visit: not scheduled ?Last Refill: 02/22/2021 ? ?Please Advise  ?

## 2022-02-06 NOTE — Telephone Encounter (Signed)
Medication:  ?SYNTHROID 50 MCG tablet ?losartan (COZAAR) 25 MG tablet  ?gabapentin (NEURONTIN) 300 MG capsule ?Pt also wanted to ask about getting generic xanax again.  ? ?Has the patient contacted their pharmacy? No. ? ?Preferred Pharmacy:  ?[facility] [place]  ? [address], [place] [address]  ?Phone:  [phone]  Fax:  [phone]  ?

## 2022-02-22 ENCOUNTER — Ambulatory Visit (INDEPENDENT_AMBULATORY_CARE_PROVIDER_SITE_OTHER): Payer: Medicare Other | Admitting: Physician Assistant

## 2022-02-22 ENCOUNTER — Ambulatory Visit (INDEPENDENT_AMBULATORY_CARE_PROVIDER_SITE_OTHER): Payer: Medicare Other

## 2022-02-22 VITALS — BP 128/62 | HR 67 | Ht 62.0 in | Wt 147.0 lb

## 2022-02-22 DIAGNOSIS — R002 Palpitations: Secondary | ICD-10-CM

## 2022-02-22 DIAGNOSIS — I10 Essential (primary) hypertension: Secondary | ICD-10-CM | POA: Diagnosis not present

## 2022-02-22 NOTE — Patient Instructions (Signed)
Medication Instructions:  ?Your physician recommends that you continue on your current medications as directed. Please refer to the Current Medication list given to you today. ? ?*If you need a refill on your cardiac medications before your next appointment, please call your pharmacy* ? ?Lab Work: ?NONE ordered at this time of appointment  ? ?If you have labs (blood work) drawn today and your tests are completely normal, you will receive your results only by: ?MyChart Message (if you have MyChart) OR ?A paper copy in the mail ?If you have any lab test that is abnormal or we need to change your treatment, we will call you to review the results. ? ?Testing/Procedures: ? ?ZIO XT- Long Term Monitor Instructions ? ?Your physician has requested you wear a ZIO patch monitor for 14 days.  ?This is a single patch monitor. [employer] supplies one patch monitor per enrollment. Additional ?stickers are not available. Please do not apply patch if you will be having a Nuclear Stress Test,  ?Echocardiogram, Cardiac CT, MRI, or Chest Xray during the period you would be wearing the  ?monitor. The patch cannot be worn during these tests. You cannot remove and re-apply the  ?ZIO XT patch monitor.  ?Your ZIO patch monitor will be mailed 3 day USPS to your address on file. It may take 3-5 days  ?to receive your monitor after you have been enrolled.  ?Once you have received your monitor, please review the enclosed instructions. Your monitor  ?has already been registered assigning a specific monitor serial # to you. ? ?Billing and Patient Assistance Program Information ? ?We have supplied [employer] with any of your insurance information on file for billing purposes. ?[employer] offers a sliding scale Patient Assistance Program for patients that do not have  ?insurance, or whose insurance does not completely cover the cost of the ZIO monitor.  ?You must apply for the Patient Assistance Program to qualify for this discounted rate.  ?To apply,  please call [employer] at [phone], select option 4, select option 2, ask to apply for  ?Patient Assistance Program. Theodore Demark will ask your household income, and how many people  ?are in your household. They will quote your out-of-pocket cost based on that information.  ?[employer] will also be able to set up a 66-month interest-free payment plan if needed. ? ?Applying the monitor ?  ?Shave hair from upper left chest.  ?Hold abrader disc by orange tab. Rub abrader in 40 strokes over the upper left chest as  ?indicated in your monitor instructions.  ?Clean area with 4 enclosed alcohol pads. Let dry.  ?Apply patch as indicated in monitor instructions. Patch will be placed under collarbone on left  ?side of chest with arrow pointing upward.  ?Rub patch adhesive wings for 2 minutes. Remove white label marked "1". Remove the white  ?label marked "2". Rub patch adhesive wings for 2 additional minutes.  ?While looking in a mirror, press and release button in center of patch. A small green light will  ?flash 3-4 times. This will be your only indicator that the monitor has been turned on.  ?Do not shower for the first 24 hours. You may shower after the first 24 hours.  ?Press the button if you feel a symptom. You will hear a small click. Record Date, Time and  ?Symptom in the Patient Logbook.  ?When you are ready to remove the patch, follow instructions on the last 2 pages of Patient  ?Logbook. Stick patch monitor onto the last page of  Patient Logbook.  ?Place Patient Logbook in the blue and white box. Use locking tab on box and tape box closed  ?securely. The blue and white box has prepaid postage on it. Please place it in the mailbox as  ?soon as possible. Your physician should have your test results approximately 7 days after the  ?monitor has been mailed back to [facility].  ?Call [facility] at [phone] if you have questions regarding  ?your ZIO XT patch monitor. Call them immediately if you see  an orange light blinking on your  ?monitor.  ?If your monitor falls off in less than 4 days, contact our Monitor department at [phone].  ?If your monitor becomes loose or falls off after 4 days call [employer] at [phone] for  ?suggestions on securing your monitor ? ?Follow-Up: ?At [facility], you and your health needs are our priority.  As part of our continuing mission to provide you with exceptional heart care, we have created designated Provider Care Teams.  These Care Teams include your primary Cardiologist (physician) and Advanced Practice Providers (APPs -  Physician Assistants and Nurse Practitioners) who all work together to provide you with the care you need, when you need it. ? ?We recommend signing up for the patient portal called "MyChart".  Sign up information is provided on this After Visit Summary.  MyChart is used to connect with patients for Virtual Visits (Telemedicine).  Patients are able to view lab/test results, encounter notes, upcoming appointments, etc.  Non-urgent messages can be sent to your provider as well.   ?To learn more about what you can do with MyChart, go to [email or web address].   ? ?Your next appointment:   ?2 month(s) ? ?The format for your next appointment:   ?In Person ? ?Provider:   ?Peter Martinique, MD  or Almyra Deforest, PA-C      ? ? ?Other Instructions ? ? ?Important Information About Sugar ? ? ? ? ? ? ?

## 2022-02-22 NOTE — Progress Notes (Signed)
?Cardiology Office Note:   ? ?Date:  02/24/2022  ? ?ID:  Christina Mckinney, DOB [birth year], MRN [id number] ? ?PCP:  Mosie Lukes, MD ?  ?[facility] Providers ?Cardiologist:  Peter Martinique, MD    ? ?Referring MD: Mosie Lukes, MD  ? ?Chief Complaint  ?Patient presents with  ? Follow-up  ? Palpitations  ? Headache  ? Shortness of Breath  ? Chest Pain  ? ? ?History of Present Illness:   ? ?Christina Mckinney is a 88 y.o. female with a hx of carotid artery stenosis, hypertension, hyperlipidemia intolerant of statins and anxiety.  He previously saw Dr. Audie Box and wore a heart monitor in June for palpitation.  The result was normal.  Due to dizziness and borderline low blood pressure, losartan was stopped.  He was seen by Coletta Memos, NP on 02/22/2021 for elevated blood pressure after coming off of Cozaar.  Propranolol was increased to help cover her blood pressure and palpitation, she was eventually restarted on 25 mg daily of losartan.  I saw the patient in August 2022 at which time she had worsening fatigue and the shortness of breath.  Myoview was ordered and performed on 07/03/2021 which showed EF 89%, no ischemia or infarction, normal wall motion.  ABI was normal in November 2022. She was last seen by Dr. Audie Box on 09/25/2021 at which time she continued to complain of palpitation.  Dr. Audie Box recommended switching the propranolol to metoprolol succinate 50 mg daily.  Since the last visit, patient requested to switch provider to Dr. Martinique. ? ?Since switching from propranolol to metoprolol, her palpitation has improved.  Unfortunately she still has daily episode of palpitation.  Her PCP increased losartan to 100 mg daily in February.  We discussed various options, I recommended to consider a Kardia mobile device and we will arrange a 2-week heart monitor.  I plan to see the patient back in 2 months. ? ?Past Medical History:  ?Diagnosis Date  ? Anemia, unspecified   ? Anxiety and depression 09/18/2015  ? Anxiety state,  unspecified   ? Chest pain   ? Chronic systolic CHF (congestive heart failure) ([place]) 08/12/2015  ? Depression with anxiety 06/02/2007  ? Qualifier: Diagnosis of  By: Wynona Luna   ? Depressive disorder, not elsewhere classified   ? Diverticulitis   ? DVT (deep venous thrombosis) ([place])   ? Dyspnea 07/22/2015  ? Edema 06/12/2014  ? Essential and other specified forms of tremor   ? GERD (gastroesophageal reflux disease)   ? Gout, unspecified   ? Internal hemorrhoids without mention of complication   ? Macular degeneration 08/07/2015  ? Osteoarthritis   ? Other abnormal glucose   ? Other and unspecified hyperlipidemia   ? Personal history of colonic polyps   ? Personal history of peptic ulcer disease   ? Pulmonary embolism ([place])   ? Unspecified disorder of bladder   ? Unspecified essential hypertension   ? Unspecified hypothyroidism   ? Varicose veins   ? ? ?Past Surgical History:  ?Procedure Laterality Date  ? ABDOMINAL HYSTERECTOMY    ? CHOLECYSTECTOMY    ? ENDOVENOUS ABLATION SAPHENOUS VEIN W/ LASER  09-11-2012  ? left greater saphenous vein   by Curt Jews MD  ? EYE SURGERY    ? KNEE ARTHROSCOPY    ? right   ? NASAL SEPTUM SURGERY    ? SHOULDER SURGERY    ? TOTAL KNEE ARTHROPLASTY  09/2013  ? Right  ? ? ?  Current Medications: ?Current Meds  ?Medication Sig  ? albuterol (VENTOLIN HFA) 108 (90 Base) MCG/ACT inhaler USE 2 INHALATIONS EVERY 6 HOURS AS NEEDED FOR WHEEZING OR SHORTNESS OF BREATH  ? ALPRAZolam (XANAX) 0.5 MG tablet Take 1 tablet (0.5 mg total) by mouth at bedtime as needed for anxiety.  ? amoxicillin-clavulanate (AUGMENTIN) 875-125 MG tablet Take 1 tablet by mouth 2 (two) times daily.  ? aspirin 81 MG tablet Take 1 tablet (81 mg total) by mouth daily.  ? co-enzyme Q-10 30 MG capsule Take 30 mg by mouth daily.  ? fexofenadine (ALLEGRA ALLERGY) 180 MG tablet Take 1 tablet (180 mg total) by mouth daily.  ? fluticasone (FLONASE) 50 MCG/ACT nasal spray Place 2 sprays into both nostrils daily. USE 2 SPRAYS IN EACH  NOSTRIL DAILY AS NEEDED FOR ALLERGIES OR RHINITIS  ? fluticasone (FLOVENT HFA) 110 MCG/ACT inhaler Inhale 2 puffs into the lungs 2 (two) times daily.  ? furosemide (LASIX) 40 MG tablet TAKE 1 TABLET DAILY  ? gabapentin (NEURONTIN) 300 MG capsule Take 1 capsule (300 mg total) by mouth at bedtime.  ? guaiFENesin (MUCINEX) 600 MG 12 hr tablet Take 1,200 mg by mouth 2 (two) times daily as needed for to loosen phlegm.   ? ipratropium-albuterol (DUONEB) 0.5-2.5 (3) MG/3ML SOLN USE THREE TIMES A DAY FOR NEXT 3 DAYS AND THEN AS NEEDED FOR WHEEZING.  ? losartan (COZAAR) 25 MG tablet Take 1 tablet (25 mg total) by mouth daily.  ? mebendazole (VERMOX) 100 MG chewable tablet 1 tab every other week x 2 doses  ? metoprolol succinate (TOPROL-XL) 100 MG 24 hr tablet Take 1 tablet (100 mg total) by mouth daily. Take with or immediately following a meal.  ? mupirocin cream (BACTROBAN) 2 % Apply 1 application topically 2 (two) times daily. Apply to the nose twice a day x 1 week  ? pantoprazole (PROTONIX) 40 MG tablet TAKE 1 TABLET TWICE A DAY BEFORE MEALS  ? sucralfate (CARAFATE) 1 g tablet TAKE 1 TABLET IN THE MORNING, AT NOON, IN THE EVENING AND AT BEDTIME  ? SYNTHROID 50 MCG tablet Take 1 tablet (50 mcg total) by mouth daily before breakfast.  ?  ? ?Allergies:   Simvastatin, Crestor [rosuvastatin], Lipitor [atorvastatin], Red dye, and Statins  ? ?Social History  ? ?Socioeconomic History  ? Marital status: Widowed  ?  Spouse name: Not on file  ? Number of children: 3  ? Years of education: Not on file  ? Highest education level: Not on file  ?Occupational History  ? Occupation: Retired   ?Tobacco Use  ? Smoking status: Former  ?  Years: 40.00  ?  Types: Cigarettes  ?  Quit date: 11/12/1988  ?  Years since quitting: 33.3  ? Smokeless tobacco: Never  ?Vaping Use  ? Vaping Use: Never used  ?Substance and Sexual Activity  ? Alcohol use: Yes  ?  Alcohol/week: 4.0 standard drinks  ?  Types: 4 Standard drinks or equivalent per week  ?   Comment: occasional wine   ? Drug use: No  ? Sexual activity: Never  ?  Comment: lives alone, no dietary restrictions, widowed  ?Other Topics Concern  ? Not on file  ?Social History Narrative  ? Not on file  ? ?Social Determinants of Health  ? ?Financial Resource Strain: Not on file  ?Food Insecurity: Not on file  ?Transportation Needs: Not on file  ?Physical Activity: Not on file  ?Stress: Not on file  ?Social Connections: Not on  file  ?  ? ?Family History: ?The patient's family history includes Aneurysm in her daughter; Cancer in her sister; Diabetes in her daughter; Heart attack in her father; Heart disease in her brother, daughter, father, and sister; Hip fracture in her mother; Hyperlipidemia in her daughter and father; Hypertension in her daughter; Other in her mother. There is no history of Colon cancer. ? ?ROS:   ?Please see the history of present illness.    ? All other systems reviewed and are negative. ? ?EKGs/Labs/Other Studies Reviewed:   ? ?The following studies were reviewed today: ? ?Myoview 07/03/2021 ?  The study is normal. Findings are consistent with no prior ischemia and no prior myocardial infarction. The study is low risk. ?  No ST deviation was noted. ?  LV perfusion is normal. There is no evidence of inducible ischemia and infarction. ?  Nuclear stress EF: 89 %. Left ventricular function is normal. ?  ?No ischemia or infarction on perfusion images. Normal wall motion. ? ?EKG:  EKG is ordered today.  The ekg ordered today demonstrates normal sinus rhythm, right bundle branch block ? ?Recent Labs: ?12/18/2021: ALT 6; BUN 16; Creatinine, Ser 1.18; Hemoglobin 13.1; Platelets 230.0; Potassium 4.1; Sodium 140; TSH 2.07  ?Recent Lipid Panel ?   ?Component Value Date/Time  ? CHOL 202 (H) 12/18/2021 1616  ? TRIG 203.0 (H) 12/18/2021 1616  ? HDL 52.70 12/18/2021 1616  ? CHOLHDL 4 12/18/2021 1616  ? VLDL 40.6 (H) 12/18/2021 1616  ? LDLCALC 103 (H) 04/25/2021 1154  ? LDLCALC 116 (H) 09/13/2020 1044  ?  LDLDIRECT 104.0 12/18/2021 1616  ? ? ? ?Risk Assessment/Calculations:   ?  ? ?    ? ?Physical Exam:   ? ?VS:  BP 128/62 (BP Location: Left Arm, Patient Position: Sitting, Cuff Size: Normal)   Pulse 67   Ht 5'

## 2022-02-22 NOTE — Progress Notes (Unsigned)
Enrolled for [employer] to mail a ZIO XT long term holter monitor to the patients address on file.   Dr. Jordan to read. 

## 2022-02-26 DIAGNOSIS — R002 Palpitations: Secondary | ICD-10-CM | POA: Diagnosis not present

## 2022-03-05 ENCOUNTER — Other Ambulatory Visit: Payer: Self-pay | Admitting: Family Medicine

## 2022-03-20 DIAGNOSIS — R002 Palpitations: Secondary | ICD-10-CM | POA: Diagnosis not present

## 2022-04-03 ENCOUNTER — Encounter: Payer: Self-pay | Admitting: *Deleted

## 2022-04-24 ENCOUNTER — Ambulatory Visit (INDEPENDENT_AMBULATORY_CARE_PROVIDER_SITE_OTHER): Payer: Medicare Other | Admitting: Physician Assistant

## 2022-04-24 ENCOUNTER — Encounter: Payer: Self-pay | Admitting: Physician Assistant

## 2022-04-24 VITALS — BP 140/62 | HR 63 | Ht 61.0 in | Wt 154.0 lb

## 2022-04-24 DIAGNOSIS — I10 Essential (primary) hypertension: Secondary | ICD-10-CM

## 2022-04-24 DIAGNOSIS — R002 Palpitations: Secondary | ICD-10-CM

## 2022-04-24 NOTE — Patient Instructions (Signed)
Medication Instructions:  Your physician recommends that you continue on your current medications as directed. Please refer to the Current Medication list given to you today.   *If you need a refill on your cardiac medications before your next appointment, please call your pharmacy*   Lab Work: NONE ordered at this time of appointment   If you have labs (blood work) drawn today and your tests are completely normal, you will receive your results only by: MyChart Message (if you have MyChart) OR A paper copy in the mail If you have any lab test that is abnormal or we need to change your treatment, we will call you to review the results.   Testing/Procedures: NONE ordered at this time of appointment    Follow-Up: At [facility], you and your health needs are our priority.  As part of our continuing mission to provide you with exceptional heart care, we have created designated Provider Care Teams.  These Care Teams include your primary Cardiologist (physician) and Advanced Practice Providers (APPs -  Physician Assistants and Nurse Practitioners) who all work together to provide you with the care you need, when you need it.  We recommend signing up for the patient portal called "MyChart".  Sign up information is provided on this After Visit Summary.  MyChart is used to connect with patients for Virtual Visits (Telemedicine).  Patients are able to view lab/test results, encounter notes, upcoming appointments, etc.  Non-urgent messages can be sent to your provider as well.   To learn more about what you can do with MyChart, go to [email or web address].    Your next appointment:   6 month(s)  The format for your next appointment:   In Person  Provider:   Peter Jordan, MD     Other Instructions   Important Information About Sugar       

## 2022-04-24 NOTE — Progress Notes (Signed)
Cardiology Office Note:    Date:  04/26/2022   ID:  Christina Mckinney, DOB Dec 09, 1931, MRN [id number]  PCP:  Mosie Lukes, MD   [facility] Providers Cardiologist:  Peter Martinique, MD     Referring MD: Mosie Lukes, MD   Chief Complaint  Patient presents with   Follow-up    Seen for Dr. Martinique    History of Present Illness:    Christina Mckinney is a 88 y.o. female with a hx of carotid artery stenosis, hypertension, hyperlipidemia intolerant of statins and anxiety.  He previously saw Dr. Audie Box and wore a heart monitor in June for palpitation.  The result was normal.  Due to dizziness and borderline low blood pressure, losartan was stopped.  He was seen by Coletta Memos, NP on 02/22/2021 for elevated blood pressure after coming off of Cozaar.  Propranolol was increased to help cover her blood pressure and palpitation, she was eventually restarted on 25 mg daily of losartan.  I saw the patient in August 2022 at which time she had worsening fatigue and the shortness of breath.  Myoview was ordered and performed on 07/03/2021 which showed EF 89%, no ischemia or infarction, normal wall motion.  ABI was normal in November 2022. She was last seen by Dr. Audie Box on 09/25/2021 at which time she continued to complain of palpitation.  Dr. Audie Box recommended switching the propranolol to metoprolol succinate 50 mg daily.  Since the last visit, patient requested to switch provider to Dr. Martinique.   Since switching from propranolol to metoprolol, her palpitation has improved.  Despite switching to metoprolol, she was still having persistent palpitation, that is why we repeated her heart monitor.  Her monitor shows several episodes of very transient burst of fast heartbeat, typically lasting 3-5 beats each.  She does not appears to be symptomatic with those as none of the episode patient triggered events.  When she did have triggering events, the underlying rhythm has always been sinus rhythm with rare episode of  PVC.  I do not think her palpitation is truly coming from the heart.  I recommended continue on the current therapy and follow-up with Dr. Martinique in 6 months.    Past Medical History:  Diagnosis Date   Anemia, unspecified    Anxiety and depression 09/18/2015   Anxiety state, unspecified    Chest pain    Chronic systolic CHF (congestive heart failure) ([place]) 08/12/2015   Depression with anxiety 06/02/2007   Qualifier: Diagnosis of  By: Wynona Luna    Depressive disorder, not elsewhere classified    Diverticulitis    DVT (deep venous thrombosis) ([place])    Dyspnea 07/22/2015   Edema 06/12/2014   Essential and other specified forms of tremor    GERD (gastroesophageal reflux disease)    Gout, unspecified    Internal hemorrhoids without mention of complication    Macular degeneration 08/07/2015   Osteoarthritis    Other abnormal glucose    Other and unspecified hyperlipidemia    Personal history of colonic polyps    Personal history of peptic ulcer disease    Pulmonary embolism ([place])    Unspecified disorder of bladder    Unspecified essential hypertension    Unspecified hypothyroidism    Varicose veins     Past Surgical History:  Procedure Laterality Date   ABDOMINAL HYSTERECTOMY     CHOLECYSTECTOMY     ENDOVENOUS ABLATION SAPHENOUS VEIN W/ LASER  09-11-2012   left greater saphenous vein  by Curt Jews MD   EYE SURGERY     KNEE ARTHROSCOPY     right    NASAL SEPTUM SURGERY     SHOULDER SURGERY     TOTAL KNEE ARTHROPLASTY  09/2013   Right    Current Medications: Current Meds  Medication Sig   albuterol (VENTOLIN HFA) 108 (90 Base) MCG/ACT inhaler USE 2 INHALATIONS EVERY 6 HOURS AS NEEDED FOR WHEEZING OR SHORTNESS OF BREATH   ALPRAZolam (XANAX) 0.5 MG tablet Take 1 tablet (0.5 mg total) by mouth at bedtime as needed for anxiety.   aspirin 81 MG tablet Take 1 tablet (81 mg total) by mouth daily.   co-enzyme Q-10 30 MG capsule Take 30 mg by mouth daily.   fluticasone  (FLONASE) 50 MCG/ACT nasal spray Place 2 sprays into both nostrils daily. USE 2 SPRAYS IN EACH NOSTRIL DAILY AS NEEDED FOR ALLERGIES OR RHINITIS   furosemide (LASIX) 40 MG tablet TAKE 1 TABLET DAILY   gabapentin (NEURONTIN) 300 MG capsule Take 1 capsule (300 mg total) by mouth at bedtime.   guaiFENesin (MUCINEX) 600 MG 12 hr tablet Take 1,200 mg by mouth 2 (two) times daily as needed for to loosen phlegm.    losartan (COZAAR) 25 MG tablet Take 1 tablet (25 mg total) by mouth daily.   pantoprazole (PROTONIX) 40 MG tablet TAKE 1 TABLET TWICE A DAY BEFORE MEALS   sucralfate (CARAFATE) 1 g tablet TAKE 1 TABLET IN THE MORNING, AT NOON, IN THE EVENING AND AT BEDTIME   SYNTHROID 50 MCG tablet Take 1 tablet (50 mcg total) by mouth daily before breakfast.     Allergies:   Simvastatin, Crestor [rosuvastatin], Lipitor [atorvastatin], Red dye, and Statins   Social History   Socioeconomic History   Marital status: Widowed    Spouse name: Not on file   Number of children: 3   Years of education: Not on file   Highest education level: Not on file  Occupational History   Occupation: Retired   Tobacco Use   Smoking status: Former    Years: 40.00    Types: Cigarettes    Quit date: 11/12/1988    Years since quitting: 33.4   Smokeless tobacco: Never  Vaping Use   Vaping Use: Never used  Substance and Sexual Activity   Alcohol use: Yes    Alcohol/week: 4.0 standard drinks of alcohol    Types: 4 Standard drinks or equivalent per week    Comment: occasional wine    Drug use: No   Sexual activity: Never    Comment: lives alone, no dietary restrictions, widowed  Other Topics Concern   Not on file  Social History Narrative   Not on file   Social Determinants of Health   Financial Resource Strain: Not on file  Food Insecurity: Not on file  Transportation Needs: Not on file  Physical Activity: Not on file  Stress: Not on file  Social Connections: Not on file     Family History: The patient's  family history includes Aneurysm in her daughter; Cancer in her sister; Diabetes in her daughter; Heart attack in her father; Heart disease in her brother, daughter, father, and sister; Hip fracture in her mother; Hyperlipidemia in her daughter and father; Hypertension in her daughter; Other in her mother. There is no history of Colon cancer.  ROS:   Please see the history of present illness.     All other systems reviewed and are negative.  EKGs/Labs/Other Studies Reviewed:  The following studies were reviewed today:  Myoview 07/03/2021 The study is normal. Findings are consistent with no prior ischemia and no prior myocardial infarction. The study is low risk.   No ST deviation was noted.   LV perfusion is normal. There is no evidence of inducible ischemia and infarction.   Nuclear stress EF: 89 %. Left ventricular function is normal.   No ischemia or infarction on perfusion images. Normal wall motion.     EKG:  EKG is not ordered today.    Recent Labs: 12/18/2021: ALT 6; BUN 16; Creatinine, Ser 1.18; Hemoglobin 13.1; Platelets 230.0; Potassium 4.1; Sodium 140; TSH 2.07  Recent Lipid Panel    Component Value Date/Time   CHOL 202 (H) 12/18/2021 1616   TRIG 203.0 (H) 12/18/2021 1616   HDL 52.70 12/18/2021 1616   CHOLHDL 4 12/18/2021 1616   VLDL 40.6 (H) 12/18/2021 1616   LDLCALC 103 (H) 04/25/2021 1154   LDLCALC 116 (H) 09/13/2020 1044   LDLDIRECT 104.0 12/18/2021 1616     Risk Assessment/Calculations:           Physical Exam:    VS:  BP 140/62 (BP Location: Left Arm, Patient Position: Sitting, Cuff Size: Normal)   Pulse 63   Ht '5\' 1"'$  (1.549 m)   Wt 154 lb (69.9 kg)   SpO2 95%   BMI 29.10 kg/m     Wt Readings from Last 3 Encounters:  04/24/22 154 lb (69.9 kg)  02/22/22 147 lb (66.7 kg)  09/25/21 145 lb (65.8 kg)     GEN:  Well nourished, well developed in no acute distress HEENT: Normal NECK: No JVD; No carotid bruits LYMPHATICS: No  lymphadenopathy CARDIAC: RRR, no murmurs, rubs, gallops RESPIRATORY:  Clear to auscultation without rales, wheezing or rhonchi  ABDOMEN: Soft, non-tender, non-distended MUSCULOSKELETAL:  No edema; No deformity  SKIN: Warm and dry NEUROLOGIC:  Alert and oriented x 3 PSYCHIATRIC:  Normal affect   ASSESSMENT:    1. Palpitations   2. Primary hypertension   3. Hyperlipidemia LDL goal <100    PLAN:    In order of problems listed above:  Palpitation: Despite switching to metoprolol, she still had persistent palpitation, that is why we repeated her heart monitor.  Heart monitor showed several episodes of very transient burst of fast heartbeat, typically lasting 3-5 beats each.  She does not appears to be symptomatic with those episodes.  I reviewed the manual triggered events, most of which sinus rhythm with rare PVC.  At this time, I would not recommend any changes to her medication.  Hypertension: Blood pressure stable            Medication Adjustments/Labs and Tests Ordered: Current medicines are reviewed at length with the patient today.  Concerns regarding medicines are outlined above.  No orders of the defined types were placed in this encounter.  No orders of the defined types were placed in this encounter.   Patient Instructions  Medication Instructions:  Your physician recommends that you continue on your current medications as directed. Please refer to the Current Medication list given to you today.  *If you need a refill on your cardiac medications before your next appointment, please call your pharmacy*  Lab Work: NONE ordered at this time of appointment   If you have labs (blood work) drawn today and your tests are completely normal, you will receive your results only by: [place] (if you have MyChart) OR A paper copy in the mail If you  have any lab test that is abnormal or we need to change your treatment, we will call you to review the  results.  Testing/Procedures: NONE ordered at this time of appointment   Follow-Up: At [facility], you and your health needs are our priority.  As part of our continuing mission to provide you with exceptional heart care, we have created designated Provider Care Teams.  These Care Teams include your primary Cardiologist (physician) and Advanced Practice Providers (APPs -  Physician Assistants and Nurse Practitioners) who all work together to provide you with the care you need, when you need it.  We recommend signing up for the patient portal called "MyChart".  Sign up information is provided on this After Visit Summary.  MyChart is used to connect with patients for Virtual Visits (Telemedicine).  Patients are able to view lab/test results, encounter notes, upcoming appointments, etc.  Non-urgent messages can be sent to your provider as well.   To learn more about what you can do with MyChart, go to [email or web address].    Your next appointment:   6 month(s)  The format for your next appointment:   In Person  Provider:   Peter Martinique, MD     Other Instructions   Important Information About Sugar         Signed, Almyra Deforest, [place]  04/26/2022 11:22 PM    [place]

## 2022-04-26 ENCOUNTER — Encounter: Payer: Self-pay | Admitting: Physician Assistant

## 2022-06-04 ENCOUNTER — Other Ambulatory Visit: Payer: Self-pay | Admitting: Family Medicine

## 2022-06-18 ENCOUNTER — Other Ambulatory Visit: Payer: Self-pay | Admitting: Family Medicine

## 2022-06-18 ENCOUNTER — Other Ambulatory Visit: Payer: Self-pay | Admitting: *Deleted

## 2022-06-20 ENCOUNTER — Other Ambulatory Visit: Payer: Self-pay | Admitting: Family Medicine

## 2022-08-13 ENCOUNTER — Other Ambulatory Visit: Payer: Self-pay | Admitting: Family Medicine

## 2022-08-13 DIAGNOSIS — M109 Gout, unspecified: Secondary | ICD-10-CM

## 2022-08-15 ENCOUNTER — Other Ambulatory Visit: Payer: Self-pay

## 2022-08-15 NOTE — Telephone Encounter (Signed)
Medication was not prescribed nor is managed by Dr. Audie Box per med list. Please defer to PCP. Dr. Charlett Blake.

## 2022-08-24 ENCOUNTER — Telehealth: Payer: Self-pay

## 2022-08-24 ENCOUNTER — Other Ambulatory Visit: Payer: Self-pay

## 2022-08-24 NOTE — Telephone Encounter (Signed)
done

## 2022-08-24 NOTE — Telephone Encounter (Signed)
[facility] call for VO for Mupirocin cream 2% was given.

## 2022-09-04 DIAGNOSIS — Z6828 Body mass index (BMI) 28.0-28.9, adult: Secondary | ICD-10-CM | POA: Diagnosis not present

## 2022-09-04 DIAGNOSIS — Z01419 Encounter for gynecological examination (general) (routine) without abnormal findings: Secondary | ICD-10-CM | POA: Diagnosis not present

## 2022-09-05 ENCOUNTER — Telehealth: Payer: Self-pay | Admitting: Family

## 2022-09-05 ENCOUNTER — Ambulatory Visit (INDEPENDENT_AMBULATORY_CARE_PROVIDER_SITE_OTHER): Payer: Medicare Other | Admitting: Family

## 2022-09-05 VITALS — BP 133/54 | HR 65 | Temp 97.9°F | Resp 16 | Wt 151.0 lb

## 2022-09-05 DIAGNOSIS — E119 Type 2 diabetes mellitus without complications: Secondary | ICD-10-CM | POA: Diagnosis not present

## 2022-09-05 DIAGNOSIS — F419 Anxiety disorder, unspecified: Secondary | ICD-10-CM | POA: Diagnosis not present

## 2022-09-05 DIAGNOSIS — E039 Hypothyroidism, unspecified: Secondary | ICD-10-CM | POA: Diagnosis not present

## 2022-09-05 DIAGNOSIS — E785 Hyperlipidemia, unspecified: Secondary | ICD-10-CM

## 2022-09-05 DIAGNOSIS — I5022 Chronic systolic (congestive) heart failure: Secondary | ICD-10-CM | POA: Diagnosis not present

## 2022-09-05 DIAGNOSIS — F32A Depression, unspecified: Secondary | ICD-10-CM | POA: Diagnosis not present

## 2022-09-05 DIAGNOSIS — D649 Anemia, unspecified: Secondary | ICD-10-CM

## 2022-09-05 DIAGNOSIS — I10 Essential (primary) hypertension: Secondary | ICD-10-CM | POA: Diagnosis not present

## 2022-09-05 DIAGNOSIS — E213 Hyperparathyroidism, unspecified: Secondary | ICD-10-CM

## 2022-09-05 DIAGNOSIS — G6289 Other specified polyneuropathies: Secondary | ICD-10-CM | POA: Diagnosis not present

## 2022-09-05 DIAGNOSIS — G4730 Sleep apnea, unspecified: Secondary | ICD-10-CM

## 2022-09-05 LAB — COMPREHENSIVE METABOLIC PANEL
ALT: 6 U/L (ref 0–35)
AST: 19 U/L (ref 0–37)
Albumin: 4.1 g/dL (ref 3.5–5.2)
Alkaline Phosphatase: 84 U/L (ref 39–117)
BUN: 16 mg/dL (ref 6–23)
CO2: 30 mEq/L (ref 19–32)
Calcium: 10.3 mg/dL (ref 8.4–10.5)
Chloride: 104 mEq/L (ref 96–112)
Creatinine, Ser: 1.04 mg/dL (ref 0.40–1.20)
GFR: 47.94 mL/min — ABNORMAL LOW (ref >60.00)
Glucose, Bld: 104 mg/dL — ABNORMAL HIGH (ref 70–99)
Potassium: 4.5 mEq/L (ref 3.5–5.1)
Sodium: 140 mEq/L (ref 135–145)
Total Bilirubin: 0.6 mg/dL (ref 0.2–1.2)
Total Protein: 6.9 g/dL (ref 6.0–8.3)

## 2022-09-05 LAB — TSH: TSH: 2.4 u[IU]/mL (ref 0.35–5.50)

## 2022-09-05 LAB — LIPID PANEL
Cholesterol: 204 mg/dL — ABNORMAL HIGH (ref 0–200)
HDL: 53.2 mg/dL (ref >39.00)
LDL Cholesterol: 121 mg/dL — ABNORMAL HIGH (ref 0–99)
NonHDL: 150.7
Total CHOL/HDL Ratio: 4
Triglycerides: 150 mg/dL — ABNORMAL HIGH (ref 0.0–149.0)
VLDL: 30 mg/dL (ref 0.0–40.0)

## 2022-09-05 LAB — HEMOGLOBIN A1C: Hgb A1c MFr Bld: 5.9 % (ref 4.6–6.5)

## 2022-09-05 MED ORDER — LOSARTAN POTASSIUM 25 MG PO TABS: 25.0000 mg | ORAL_TABLET | Freq: Every day | ORAL | 3 refills | Status: DC

## 2022-09-05 MED ORDER — ALPRAZOLAM 0.5 MG PO TABS: 0.5000 mg | ORAL_TABLET | Freq: Every evening | ORAL | 0 refills | Status: DC | PRN

## 2022-09-05 MED ORDER — FUROSEMIDE 40 MG PO TABS: 40.0000 mg | ORAL_TABLET | Freq: Every day | ORAL | 3 refills | Status: DC

## 2022-09-05 NOTE — Assessment & Plan Note (Addendum)
Recent worsening of anxiety, requesting refill of xanax. UDS will be obtained, controlled substance contract is updated.

## 2022-09-05 NOTE — Assessment & Plan Note (Signed)
Check follow up calcium.

## 2022-09-05 NOTE — Assessment & Plan Note (Signed)
Lab Results  Component Value Date   WBC 8.3 12/18/2021   HGB 13.1 12/18/2021   HCT 40.6 12/18/2021   MCV 96.3 12/18/2021   PLT 230.0 12/18/2021   Last CBC was WNL.

## 2022-09-05 NOTE — Assessment & Plan Note (Signed)
BP Readings from Last 3 Encounters:  09/05/22 (!) 133/54  04/24/22 140/62  02/22/22 128/62   BP stable. Continue losartan '25mg'$ .

## 2022-09-05 NOTE — Assessment & Plan Note (Signed)
Stable on HS gabapentin. Continue same.

## 2022-09-05 NOTE — Telephone Encounter (Signed)
Please call Dr. Kellie Moor office and request a copy of DM eye exam.

## 2022-09-05 NOTE — Progress Notes (Signed)
Subjective:   By signing my name below, I, Shehryar Baig, attest that this documentation has been prepared under the direction and in the presence of Debbrah Alar, NP. 09/05/2022      Patient ID: Christina Mckinney, female    DOB: 08/16/1932, 88 y.o.   MRN: [id number]  Chief Complaint  Patient presents with   Diabetes    Here for follow up   Hypertension    Here for follow up   Insomnia    Needs refill on Xanax    Patient is in today for a follow up visit.   Blood pressure- She continues taking 100 mg metoprolol succinate daily PO, 25 mg losartan daily PO, 40 mg lasix daily PO and reports no new issues while taking them. She is requesting are refill on it as well. She has occasional shortness of breath, otherwise her breathing is doing well.  BP Readings from Last 3 Encounters:  09/05/22 (!) 133/54  04/24/22 140/62  02/22/22 128/62   Pulse Readings from Last 3 Encounters:  09/05/22 65  04/24/22 63  02/22/22 67   Xanax- She has not taken 0.5 mg xanax prn for the past couple of months. She reports having increased stress in her daily life due to family circumstances. She is requesting a refill on it.   Benign tremor- She reports having a benign tremor on both hands. Her tremor recently caused her to spill her coffee on her self.   Hoarse voice- She reports having a hoarse voice for a while and has an appointment with an ENT specialist to manage it.   Gabapentin- She continues taking 300 mg gabapentin to manage her neuropathy and reports no new issues while taking it.   CPAP- She is planning on wearing her CPAP regularly.   Immunizations- She is not interested in receiving the flu vaccine this year. She has taken 3 Covid-19 vaccines and is not interested in receiving anymore.   Vision- She is UTD on vision care this year.    Health Maintenance Due  Topic Date Due   OPHTHALMOLOGY EXAM  Never done   FOOT EXAM  12/04/2017   Medicare Annual Wellness (AWV)  05/09/2018    HEMOGLOBIN A1C  06/17/2022    Past Medical History:  Diagnosis Date   Anemia, unspecified    Anxiety and depression 09/18/2015   Anxiety state, unspecified    Chest pain    Chronic systolic CHF (congestive heart failure) ([place]) 08/12/2015   Depression with anxiety 06/02/2007   Qualifier: Diagnosis of  By: Wynona Luna    Depressive disorder, not elsewhere classified    Diverticulitis    DVT (deep venous thrombosis) ([employer])    Dyspnea 07/22/2015   Edema 06/12/2014   Essential and other specified forms of tremor    GERD (gastroesophageal reflux disease)    Gout, unspecified    Internal hemorrhoids without mention of complication    Macular degeneration 08/07/2015   Osteoarthritis    Other abnormal glucose    Other and unspecified hyperlipidemia    Personal history of colonic polyps    Personal history of peptic ulcer disease    Pulmonary embolism ([place])    Unspecified disorder of bladder    Unspecified essential hypertension    Unspecified hypothyroidism    Varicose veins     Past Surgical History:  Procedure Laterality Date   ABDOMINAL HYSTERECTOMY     CHOLECYSTECTOMY     ENDOVENOUS ABLATION SAPHENOUS VEIN W/ LASER  09-11-2012  left greater saphenous vein   by Curt Jews MD   EYE SURGERY     KNEE ARTHROSCOPY     right    NASAL SEPTUM SURGERY     SHOULDER SURGERY     TOTAL KNEE ARTHROPLASTY  09/2013   Right    Family History  Problem Relation Age of Onset   Other Mother        varicose veins   Hip fracture Mother    Heart disease Father        CHF   Hyperlipidemia Father    Heart attack Father    Cancer Sister        stomach   Heart disease Sister    Heart disease Brother    Diabetes Daughter    Heart disease Daughter    Hyperlipidemia Daughter    Hypertension Daughter    Aneurysm Daughter    Colon cancer Neg Hx     Social History   Socioeconomic History   Marital status: Widowed    Spouse name: Not on file   Number of children: 3   Years of  education: Not on file   Highest education level: Not on file  Occupational History   Occupation: Retired   Tobacco Use   Smoking status: Former    Years: 40.00    Types: Cigarettes    Quit date: 11/12/1988    Years since quitting: 33.8   Smokeless tobacco: Never  Vaping Use   Vaping Use: Never used  Substance and Sexual Activity   Alcohol use: Yes    Alcohol/week: 4.0 standard drinks of alcohol    Types: 4 Standard drinks or equivalent per week    Comment: occasional wine    Drug use: No   Sexual activity: Never    Comment: lives alone, no dietary restrictions, widowed  Other Topics Concern   Not on file  Social History Narrative   Not on file   Social Determinants of Health   Financial Resource Strain: Not on file  Food Insecurity: Not on file  Transportation Needs: Not on file  Physical Activity: Not on file  Stress: Not on file  Social Connections: Not on file  Intimate Partner Violence: Not on file    Outpatient Medications Prior to Visit  Medication Sig Dispense Refill   albuterol (VENTOLIN HFA) 108 (90 Base) MCG/ACT inhaler USE 2 INHALATIONS Mckinney 6 HOURS AS NEEDED FOR WHEEZING OR SHORTNESS OF BREATH 51 g 3   aspirin 81 MG tablet Take 1 tablet (81 mg total) by mouth daily. 30 tablet    co-enzyme Q-10 30 MG capsule Take 30 mg by mouth daily.     fexofenadine (ALLEGRA ALLERGY) 180 MG tablet Take 1 tablet (180 mg total) by mouth daily. 90 tablet 1   gabapentin (NEURONTIN) 300 MG capsule TAKE 1 CAPSULE AT BEDTIME 90 capsule 0   guaiFENesin (MUCINEX) 600 MG 12 hr tablet Take 1,200 mg by mouth 2 (two) times daily as needed for to loosen phlegm.      metoprolol succinate (TOPROL-XL) 100 MG 24 hr tablet TAKE 1 TABLET DAILY WITH OR IMMEDIATELY FOLLOWING A MEAL 90 tablet 3   mupirocin cream (BACTROBAN) 2 % Apply 1 application topically 2 (two) times daily. Apply to the nose twice a day x 1 week 15 g 1   pantoprazole (PROTONIX) 40 MG tablet TAKE 1 TABLET TWICE A DAY BEFORE  MEALS 180 tablet 3   SYNTHROID 50 MCG tablet TAKE 1 TABLET DAILY BEFORE BREAKFAST  90 tablet 3   ALPRAZolam (XANAX) 0.5 MG tablet Take 1 tablet (0.5 mg total) by mouth at bedtime as needed for anxiety. 30 tablet 0   furosemide (LASIX) 40 MG tablet TAKE 1 TABLET DAILY 90 tablet 3   losartan (COZAAR) 25 MG tablet TAKE 1 TABLET DAILY 90 tablet 3   fluticasone (FLONASE) 50 MCG/ACT nasal spray Place 2 sprays into both nostrils daily. USE 2 SPRAYS IN EACH NOSTRIL DAILY AS NEEDED FOR ALLERGIES OR RHINITIS 16 g 5   fluticasone (FLOVENT HFA) 110 MCG/ACT inhaler Inhale 2 puffs into the lungs 2 (two) times daily. (Patient not taking: Reported on 04/24/2022) 3 Inhaler 1   ipratropium-albuterol (DUONEB) 0.5-2.5 (3) MG/3ML SOLN USE THREE TIMES A DAY FOR NEXT 3 DAYS AND THEN AS NEEDED FOR WHEEZING. (Patient not taking: Reported on 04/24/2022) 60 mL 8   mebendazole (VERMOX) 100 MG chewable tablet 1 tab Mckinney other week x 2 doses (Patient not taking: Reported on 04/24/2022) 2 tablet 0   sucralfate (CARAFATE) 1 g tablet TAKE 1 TABLET IN THE MORNING, AT NOON, IN THE EVENING AND AT BEDTIME 360 tablet 3   No facility-administered medications prior to visit.    Allergies  Allergen Reactions   Simvastatin Other (See Comments)    Hip pain   Crestor [Rosuvastatin]     Left hip pain   Lipitor [Atorvastatin]     Pain in hips   Red Dye     Hip pain   Statins Other (See Comments)    Muscle aches, could not walk    ROS    See HPI Objective:    Physical Exam Constitutional:      General: She is not in acute distress.    Appearance: Normal appearance. She is not ill-appearing.  HENT:     Head: Normocephalic and atraumatic.     Right Ear: External ear normal.     Left Ear: External ear normal.  Eyes:     Extraocular Movements: Extraocular movements intact.     Pupils: Pupils are equal, round, and reactive to light.  Cardiovascular:     Rate and Rhythm: Normal rate and regular rhythm.     Pulses:           Dorsalis pedis pulses are 2+ on the right side and 2+ on the left side.       Posterior tibial pulses are 2+ on the right side and 2+ on the left side.     Heart sounds: Normal heart sounds. No murmur heard.    No gallop.  Pulmonary:     Effort: Pulmonary effort is normal. No respiratory distress.     Breath sounds: Normal breath sounds. No wheezing or rales.  Skin:    General: Skin is warm and dry.  Neurological:     Mental Status: She is alert and oriented to person, place, and time.  Psychiatric:        Judgment: Judgment normal.    Diabetic Foot Exam - Simple   Simple Foot Form Visual Inspection No deformities, no ulcerations, no other skin breakdown bilaterally: Yes Sensation Testing See comments: Yes Pulse Check Posterior Tibialis and Dorsalis pulse intact bilaterally: Yes Comments Diminished sensation to monofilament bilaterally      BP (!) 133/54 (BP Location: Right Arm, Patient Position: Sitting, Cuff Size: Small)   Pulse 65   Temp 97.9 F (36.6 C) (Oral)   Resp 16   Wt 151 lb (68.5 kg)   SpO2 95%   BMI  28.53 kg/m  Wt Readings from Last 3 Encounters:  09/05/22 151 lb (68.5 kg)  04/24/22 154 lb (69.9 kg)  02/22/22 147 lb (66.7 kg)       Assessment & Plan:   Problem List Items Addressed This Visit       Unprioritized   Sleep apnea    She is not currently using CPAP but plans to restart.       Primary hypertension - Primary    BP Readings from Last 3 Encounters:  09/05/22 (!) 133/54  04/24/22 140/62  02/22/22 128/62  BP stable. Continue losartan 33m.       Relevant Medications   furosemide (LASIX) 40 MG tablet   losartan (COZAAR) 25 MG tablet   Peripheral neuropathy    Stable on HS gabapentin. Continue same.       Relevant Medications   ALPRAZolam (XANAX) 0.5 MG tablet   Hypothyroid    On synthroid 571m. Stable. Continue same. Obtain follow up tsh.       Relevant Orders   TSH   [place] ([place]   Check follow up calcium.        Relevant Orders   Comp Met (CMET)   Hyperlipidemia    Lab Results  Component Value Date   CHOL 202 (H) 12/18/2021   HDL 52.70 12/18/2021   LDLCALC 103 (H) 04/25/2021   LDLDIRECT 104.0 12/18/2021   TRIG 203.0 (H) 12/18/2021   CHOLHDL 4 12/18/2021  Stable last time.  Obtain follow up A1C.       Relevant Medications   furosemide (LASIX) 40 MG tablet   losartan (COZAAR) 25 MG tablet   Other Relevant Orders   Lipid panel   Diabetes mellitus without complication (HCC)    Lab Results  Component Value Date   HGBA1C 5.8 12/18/2021   HGBA1C 5.7 08/10/2021   HGBA1C 5.3 09/13/2020   Lab Results  Component Value Date   MICROALBUR 0.9 01/31/2016   LDLCALC 103 (H) 04/25/2021   CREATININE 1.18 12/18/2021  Clinically stable.  Obtain follow up A1C.        Relevant Medications   losartan (COZAAR) 25 MG tablet   Other Relevant Orders   Hemoglobin A1Y6E Chronic systolic CHF (congestive heart failure) (HCC)    Overall stable. Continues furosemide daily.       Relevant Medications   furosemide (LASIX) 40 MG tablet   losartan (COZAAR) 25 MG tablet   Anxiety and depression    Recent worsening of anxiety, requesting refill of xanax. UDS will be obtained, controlled substance contract is updated.      Relevant Medications   ALPRAZolam (XANAX) 0.5 MG tablet   Anemia    Lab Results  Component Value Date   WBC 8.3 12/18/2021   HGB 13.1 12/18/2021   HCT 40.6 12/18/2021   MCV 96.3 12/18/2021   PLT 230.0 12/18/2021  Last CBC was WNL.       Other Visit Diagnoses     Anxiety       Relevant Medications   ALPRAZolam (XANAX) 0.5 MG tablet   Other Relevant Orders   DRUG MONITORING, PANEL 8 WITH CONFIRMATION, URINE      Declines flu shot and covid vaccine.   Meds ordered this encounter  Medications   ALPRAZolam (XANAX) 0.5 MG tablet    Sig: Take 1 tablet (0.5 mg total) by mouth at bedtime as needed for anxiety.    Dispense:  30 tablet    Refill:  0  furosemide  (LASIX) 40 MG tablet    Sig: Take 1 tablet (40 mg total) by mouth daily.    Dispense:  90 tablet    Refill:  3   losartan (COZAAR) 25 MG tablet    Sig: Take 1 tablet (25 mg total) by mouth daily.    Dispense:  90 tablet    Refill:  3    I, Nance Pear, NP, personally preformed the services described in this documentation.  All medical record entries made by the scribe were at my direction and in my presence.  I have reviewed the chart and discharge instructions (if applicable) and agree that the record reflects my personal performance and is accurate and complete. 09/05/2022   I,Shehryar Baig,acting as a Education administrator for Nance Pear, NP.,have documented all relevant documentation on the behalf of Nance Pear, NP,as directed by  Nance Pear, NP while in the presence of Nance Pear, NP.   Nance Pear, NP

## 2022-09-05 NOTE — Assessment & Plan Note (Signed)
She is not currently using CPAP but plans to restart.

## 2022-09-05 NOTE — Assessment & Plan Note (Signed)
Lab Results  Component Value Date   HGBA1C 5.8 12/18/2021   HGBA1C 5.7 08/10/2021   HGBA1C 5.3 09/13/2020   Lab Results  Component Value Date   MICROALBUR 0.9 01/31/2016   LDLCALC 103 (H) 04/25/2021   CREATININE 1.18 12/18/2021   Clinically stable.  Obtain follow up A1C.

## 2022-09-05 NOTE — Assessment & Plan Note (Signed)
Lab Results  Component Value Date   CHOL 202 (H) 12/18/2021   HDL 52.70 12/18/2021   LDLCALC 103 (H) 04/25/2021   LDLDIRECT 104.0 12/18/2021   TRIG 203.0 (H) 12/18/2021   CHOLHDL 4 12/18/2021   Stable last time.  Obtain follow up A1C.

## 2022-09-05 NOTE — Telephone Encounter (Signed)
Records release will be faxed

## 2022-09-05 NOTE — Assessment & Plan Note (Signed)
Overall stable. Continues furosemide daily.

## 2022-09-05 NOTE — Assessment & Plan Note (Signed)
On synthroid 20mg. Stable. Continue same. Obtain follow up tsh.

## 2022-09-06 LAB — DRUG MONITORING, PANEL 8 WITH CONFIRMATION, URINE
6 Acetylmorphine: NEGATIVE ng/mL (ref <10)
Alcohol Metabolites: NEGATIVE ng/mL (ref <500)
Amphetamines: NEGATIVE ng/mL (ref <500)
Benzodiazepines: NEGATIVE ng/mL (ref <100)
Buprenorphine, Urine: NEGATIVE ng/mL (ref <5)
Cocaine Metabolite: NEGATIVE ng/mL (ref <150)
Creatinine: 106.1 mg/dL (ref >=20.0)
MDMA: NEGATIVE ng/mL (ref <500)
Marijuana Metabolite: NEGATIVE ng/mL (ref <20)
Opiates: NEGATIVE ng/mL (ref <100)
Oxidant: NEGATIVE ug/mL (ref <200)
Oxycodone: NEGATIVE ng/mL (ref <100)
pH: 6.2 (ref 4.5–9.0)

## 2022-09-06 LAB — DM TEMPLATE

## 2022-09-07 ENCOUNTER — Encounter: Payer: Self-pay | Admitting: Family

## 2022-09-07 NOTE — Progress Notes (Signed)
Mailed out to pt 

## 2022-09-24 DIAGNOSIS — Z1231 Encounter for screening mammogram for malignant neoplasm of breast: Secondary | ICD-10-CM | POA: Diagnosis not present

## 2022-09-24 DIAGNOSIS — N3945 Continuous leakage: Secondary | ICD-10-CM | POA: Diagnosis not present

## 2022-09-24 DIAGNOSIS — K640 First degree hemorrhoids: Secondary | ICD-10-CM | POA: Diagnosis not present

## 2022-09-24 DIAGNOSIS — B80 Enterobiasis: Secondary | ICD-10-CM | POA: Diagnosis not present

## 2022-10-11 NOTE — Progress Notes (Deleted)
Cardiology Office Note:    Date:  04/26/2022   ID:  Christina Mckinney, DOB 1932-01-22, MRN [id number]  PCP:  Mosie Lukes, MD   [facility] Providers Cardiologist:  Edythe Riches Martinique, MD     Referring MD: Mosie Lukes, MD   Chief Complaint  Patient presents with   Follow-up    Seen for Dr. Martinique    History of Present Illness:    Christina Mckinney is a 88 y.o. female with a hx of carotid artery stenosis, hypertension, hyperlipidemia intolerant of statins and anxiety. Remote history of normal cardiac cath in [place].  He previously saw Dr. Audie Box and wore a heart monitor in June for palpitation.  The result was normal.  Due to dizziness and borderline low blood pressure, losartan was stopped.  He was seen by Coletta Memos, NP on 02/22/2021 for elevated blood pressure after coming off of Cozaar.  Propranolol was increased to help cover her blood pressure and palpitation, she was eventually restarted on 25 mg daily of losartan.  Seen in  August 2022 at which time she had worsening fatigue and the shortness of breath.  Myoview was ordered and performed on 07/03/2021 which showed EF 89%, no ischemia or infarction, normal wall motion.  ABI was normal in November 2022. She was last seen by Dr. Audie Box on 09/25/2021 at which time she continued to complain of palpitation.  Dr. Audie Box recommended switching the propranolol to metoprolol succinate 50 mg daily.     Despite switching to metoprolol, she was still having persistent palpitation. Event monitor was repeated.  Her monitor shows several episodes of very transient burst of fast heartbeat, typically lasting 3-5 beats each.  She does not appears to be symptomatic with those as none of the episode patient triggered events.  When she did have triggering events, the underlying rhythm has always been sinus rhythm with rare episode of PVC.  I do not think her palpitation is truly coming from the heart.  I recommended continue on the current therapy and  follow-up with Dr. Martinique in 6 months.    Past Medical History:  Diagnosis Date   Anemia, unspecified    Anxiety and depression 09/18/2015   Anxiety state, unspecified    Chest pain    Chronic systolic CHF (congestive heart failure) ([place]) 08/12/2015   Depression with anxiety 06/02/2007   Qualifier: Diagnosis of  By: Wynona Luna    Depressive disorder, not elsewhere classified    Diverticulitis    DVT (deep venous thrombosis) ([place])    Dyspnea 07/22/2015   Edema 06/12/2014   Essential and other specified forms of tremor    GERD (gastroesophageal reflux disease)    Gout, unspecified    Internal hemorrhoids without mention of complication    Macular degeneration 08/07/2015   Osteoarthritis    Other abnormal glucose    Other and unspecified hyperlipidemia    Personal history of colonic polyps    Personal history of peptic ulcer disease    Pulmonary embolism (HCC)    Unspecified disorder of bladder    Unspecified essential hypertension    Unspecified hypothyroidism    Varicose veins     Past Surgical History:  Procedure Laterality Date   ABDOMINAL HYSTERECTOMY     CHOLECYSTECTOMY     ENDOVENOUS ABLATION SAPHENOUS VEIN W/ LASER  09-11-2012   left greater saphenous vein   by Curt Jews MD   EYE SURGERY     KNEE ARTHROSCOPY  right    NASAL SEPTUM SURGERY     SHOULDER SURGERY     TOTAL KNEE ARTHROPLASTY  09/2013   Right    Current Medications: Current Meds  Medication Sig   albuterol (VENTOLIN HFA) 108 (90 Base) MCG/ACT inhaler USE 2 INHALATIONS EVERY 6 HOURS AS NEEDED FOR WHEEZING OR SHORTNESS OF BREATH   ALPRAZolam (XANAX) 0.5 MG tablet Take 1 tablet (0.5 mg total) by mouth at bedtime as needed for anxiety.   aspirin 81 MG tablet Take 1 tablet (81 mg total) by mouth daily.   co-enzyme Q-10 30 MG capsule Take 30 mg by mouth daily.   fluticasone (FLONASE) 50 MCG/ACT nasal spray Place 2 sprays into both nostrils daily. USE 2 SPRAYS IN EACH NOSTRIL DAILY AS NEEDED FOR  ALLERGIES OR RHINITIS   furosemide (LASIX) 40 MG tablet TAKE 1 TABLET DAILY   gabapentin (NEURONTIN) 300 MG capsule Take 1 capsule (300 mg total) by mouth at bedtime.   guaiFENesin (MUCINEX) 600 MG 12 hr tablet Take 1,200 mg by mouth 2 (two) times daily as needed for to loosen phlegm.    losartan (COZAAR) 25 MG tablet Take 1 tablet (25 mg total) by mouth daily.   pantoprazole (PROTONIX) 40 MG tablet TAKE 1 TABLET TWICE A DAY BEFORE MEALS   sucralfate (CARAFATE) 1 g tablet TAKE 1 TABLET IN THE MORNING, AT NOON, IN THE EVENING AND AT BEDTIME   SYNTHROID 50 MCG tablet Take 1 tablet (50 mcg total) by mouth daily before breakfast.     Allergies:   Simvastatin, Crestor [rosuvastatin], Lipitor [atorvastatin], Red dye, and Statins   Social History   Socioeconomic History   Marital status: Widowed    Spouse name: Not on file   Number of children: 3   Years of education: Not on file   Highest education level: Not on file  Occupational History   Occupation: Retired   Tobacco Use   Smoking status: Former    Years: 40.00    Types: Cigarettes    Quit date: 11/12/1988    Years since quitting: 33.4   Smokeless tobacco: Never  Vaping Use   Vaping Use: Never used  Substance and Sexual Activity   Alcohol use: Yes    Alcohol/week: 4.0 standard drinks of alcohol    Types: 4 Standard drinks or equivalent per week    Comment: occasional wine    Drug use: No   Sexual activity: Never    Comment: lives alone, no dietary restrictions, widowed  Other Topics Concern   Not on file  Social History Narrative   Not on file   Social Determinants of Health   Financial Resource Strain: Not on file  Food Insecurity: Not on file  Transportation Needs: Not on file  Physical Activity: Not on file  Stress: Not on file  Social Connections: Not on file     Family History: The patient's family history includes Aneurysm in her daughter; Cancer in her sister; Diabetes in her daughter; Heart attack in her  father; Heart disease in her brother, daughter, father, and sister; Hip fracture in her mother; Hyperlipidemia in her daughter and father; Hypertension in her daughter; Other in her mother. There is no history of Colon cancer.  ROS:   Please see the history of present illness.     All other systems reviewed and are negative.  EKGs/Labs/Other Studies Reviewed:    The following studies were reviewed today:  Myoview 07/03/2021 The study is normal. Findings are consistent with no  prior ischemia and no prior myocardial infarction. The study is low risk.   No ST deviation was noted.   LV perfusion is normal. There is no evidence of inducible ischemia and infarction.   Nuclear stress EF: 89 %. Left ventricular function is normal.   No ischemia or infarction on perfusion images. Normal wall motion.     EKG:  EKG is not ordered today.    Recent Labs: 12/18/2021: ALT 6; BUN 16; Creatinine, Ser 1.18; Hemoglobin 13.1; Platelets 230.0; Potassium 4.1; Sodium 140; TSH 2.07  Recent Lipid Panel    Component Value Date/Time   CHOL 202 (H) 12/18/2021 1616   TRIG 203.0 (H) 12/18/2021 1616   HDL 52.70 12/18/2021 1616   CHOLHDL 4 12/18/2021 1616   VLDL 40.6 (H) 12/18/2021 1616   LDLCALC 103 (H) 04/25/2021 1154   LDLCALC 116 (H) 09/13/2020 1044   LDLDIRECT 104.0 12/18/2021 1616     Risk Assessment/Calculations:           Physical Exam:    VS:  BP 140/62 (BP Location: Left Arm, Patient Position: Sitting, Cuff Size: Normal)   Pulse 63   Ht '5\' 1"'$  (1.549 m)   Wt 154 lb (69.9 kg)   SpO2 95%   BMI 29.10 kg/m     Wt Readings from Last 3 Encounters:  04/24/22 154 lb (69.9 kg)  02/22/22 147 lb (66.7 kg)  09/25/21 145 lb (65.8 kg)     GEN:  Well nourished, well developed in no acute distress HEENT: Normal NECK: No JVD; No carotid bruits LYMPHATICS: No lymphadenopathy CARDIAC: RRR, no murmurs, rubs, gallops RESPIRATORY:  Clear to auscultation without rales, wheezing or rhonchi  ABDOMEN:  Soft, non-tender, non-distended MUSCULOSKELETAL:  No edema; No deformity  SKIN: Warm and dry NEUROLOGIC:  Alert and oriented x 3 PSYCHIATRIC:  Normal affect   ASSESSMENT:    1. Palpitations   2. Primary hypertension   3. Hyperlipidemia LDL goal <100    PLAN:    In order of problems listed above:  Palpitation: Despite switching to metoprolol, she still had persistent palpitation, that is why we repeated her heart monitor.  Heart monitor showed several episodes of very transient burst of fast heartbeat, typically lasting 3-5 beats each.  She does not appears to be symptomatic with those episodes.  I reviewed the manual triggered events, most of which sinus rhythm with rare PVC.  At this time, I would not recommend any changes to her medication.  Hypertension: Blood pressure stable            Medication Adjustments/Labs and Tests Ordered: Current medicines are reviewed at length with the patient today.  Concerns regarding medicines are outlined above.  No orders of the defined types were placed in this encounter.  No orders of the defined types were placed in this encounter.   Patient Instructions  Medication Instructions:  Your physician recommends that you continue on your current medications as directed. Please refer to the Current Medication list given to you today.  *If you need a refill on your cardiac medications before your next appointment, please call your pharmacy*  Lab Work: NONE ordered at this time of appointment   If you have labs (blood work) drawn today and your tests are completely normal, you will receive your results only by: [place] (if you have MyChart) OR A paper copy in the mail If you have any lab test that is abnormal or we need to change your treatment, we will call you  to review the results.  Testing/Procedures: NONE ordered at this time of appointment   Follow-Up: At [facility], you and your health needs are our priority.  As  part of our continuing mission to provide you with exceptional heart care, we have created designated Provider Care Teams.  These Care Teams include your primary Cardiologist (physician) and Advanced Practice Providers (APPs -  Physician Assistants and Nurse Practitioners) who all work together to provide you with the care you need, when you need it.  We recommend signing up for the patient portal called "MyChart".  Sign up information is provided on this After Visit Summary.  MyChart is used to connect with patients for Virtual Visits (Telemedicine).  Patients are able to view lab/test results, encounter notes, upcoming appointments, etc.  Non-urgent messages can be sent to your provider as well.   To learn more about what you can do with MyChart, go to [email or web address].    Your next appointment:   6 month(s)  The format for your next appointment:   In Person  Provider:   Aanya Haynes Martinique, MD     Other Instructions   Important Information About Sugar         Signed, Dawana Asper Martinique MD, [facility]  04/26/2022 [place]

## 2022-10-15 ENCOUNTER — Ambulatory Visit: Payer: Medicare Other | Admitting: Cardiology

## 2022-10-17 DIAGNOSIS — K219 Gastro-esophageal reflux disease without esophagitis: Secondary | ICD-10-CM | POA: Diagnosis not present

## 2022-10-17 DIAGNOSIS — R09A2 Foreign body sensation, throat: Secondary | ICD-10-CM | POA: Diagnosis not present

## 2022-10-17 DIAGNOSIS — R490 Dysphonia: Secondary | ICD-10-CM | POA: Diagnosis not present

## 2022-10-17 DIAGNOSIS — Z9889 Other specified postprocedural states: Secondary | ICD-10-CM | POA: Diagnosis not present

## 2022-10-26 DIAGNOSIS — N3946 Mixed incontinence: Secondary | ICD-10-CM | POA: Diagnosis not present

## 2022-11-08 ENCOUNTER — Telehealth: Payer: Self-pay | Admitting: Family Medicine

## 2022-11-08 DIAGNOSIS — M109 Gout, unspecified: Secondary | ICD-10-CM

## 2022-11-08 MED ORDER — PANTOPRAZOLE SODIUM 40 MG PO TBEC: DELAYED_RELEASE_TABLET | ORAL | 1 refills | Status: DC

## 2022-11-08 MED ORDER — GABAPENTIN 300 MG PO CAPS: 300.0000 mg | ORAL_CAPSULE | Freq: Every day | ORAL | 1 refills | Status: DC

## 2022-11-08 NOTE — Telephone Encounter (Signed)
Medication: gabapentin (NEURONTIN) 300 MG capsule Pt stated she is also having a gout flare up and wondered if pcp would call in something w/o an appt.   Has the patient contacted their pharmacy? No.   Preferred Pharmacy:  [facility] [place] [address] [place] [address] Phone: [phone]  Fax: [phone]

## 2022-11-08 NOTE — Telephone Encounter (Signed)
Rxs sent

## 2022-11-08 NOTE — Telephone Encounter (Signed)
Pt is also requesting this medication.  pantoprazole (PROTONIX) 40 MG tablet

## 2022-11-20 NOTE — Progress Notes (Unsigned)
Cardiology Office Note:    Date:  11/20/2022   ID:  Christina Mckinney, DOB [birth year], MRN [id number]  PCP:  Mosie Lukes, MD   [place] Providers Cardiologist:  Raylin Diguglielmo Martinique, MD     Referring MD: Mosie Lukes, MD   No chief complaint on file.   History of Present Illness:    Christina Mckinney is a 89 y.o. female with a hx of carotid artery stenosis, hypertension, hyperlipidemia intolerant of statins and anxiety.  She did have a cardiac catheterization at [facility] in 2001 and this was apparently normal. She was previously seen by  Dr. Audie Box in  2021 and wore a heart monitor in June 2022 for palpitations.  The result was normal.  Due to dizziness and borderline low blood pressure, losartan was stopped.  He was seen by Coletta Memos, NP on 02/22/2021 for elevated blood pressure after coming off of Cozaar.  Propranolol was increased to help cover her blood pressure and palpitation, she was eventually restarted on 25 mg daily of losartan.  Was seen again in  August 2022 at which time she had worsening fatigue and the shortness of breath.  Myoview was ordered and performed on 07/03/2021 which showed EF 89%, no ischemia or infarction, normal wall motion.  ABI was normal in November 2022. She was last seen by Dr. Audie Box on 09/25/2021 at which time she continued to complain of palpitation.  Dr. Audie Box recommended switching the propranolol to metoprolol succinate 50 mg daily.    Despite switching to metoprolol, she was still having persistent palpitation, so her heart monitor was repeated.  Her monitor shows several episodes of very transient burst of fast heartbeat, typically lasting 3-5 beats each.  She does not appears to be symptomatic with those as none of the episode patient triggered events.  When she did have triggering events, the underlying rhythm has always been sinus rhythm with rare episode of PVC.      Past Medical History:  Diagnosis Date   Anemia, unspecified     Anxiety and depression 09/18/2015   Anxiety state, unspecified    Chest pain    Chronic systolic CHF (congestive heart failure) ([place]) 08/12/2015   Depression with anxiety 06/02/2007   Qualifier: Diagnosis of  By: Wynona Luna    Depressive disorder, not elsewhere classified    Diverticulitis    DVT (deep venous thrombosis) ([place])    Dyspnea 07/22/2015   Edema 06/12/2014   Essential and other specified forms of tremor    GERD (gastroesophageal reflux disease)    Gout, unspecified    Internal hemorrhoids without mention of complication    Macular degeneration 08/07/2015   Osteoarthritis    Other abnormal glucose    Other and unspecified hyperlipidemia    Personal history of colonic polyps    Personal history of peptic ulcer disease    Pulmonary embolism ([place])    Unspecified disorder of bladder    Unspecified essential hypertension    Unspecified hypothyroidism    Varicose veins     Past Surgical History:  Procedure Laterality Date   ABDOMINAL HYSTERECTOMY     CHOLECYSTECTOMY     ENDOVENOUS ABLATION SAPHENOUS VEIN W/ LASER  09-11-2012   left greater saphenous vein   by Curt Jews MD   EYE SURGERY     KNEE ARTHROSCOPY     right    NASAL SEPTUM SURGERY     SHOULDER SURGERY     TOTAL KNEE  ARTHROPLASTY  09/2013   Right    Current Medications: No outpatient medications have been marked as taking for the 11/21/22 encounter (Appointment) with Martinique, Zo Loudon M, MD.     Allergies:   Simvastatin, Crestor [rosuvastatin], Lipitor [atorvastatin], Red dye, and Statins   Social History   Socioeconomic History   Marital status: Widowed    Spouse name: Not on file   Number of children: 3   Years of education: Not on file   Highest education level: Not on file  Occupational History   Occupation: Retired   Tobacco Use   Smoking status: Former    Years: 40.00    Types: Cigarettes    Quit date: 11/12/1988    Years since quitting: 34.0   Smokeless tobacco: Never  Vaping Use   Vaping  Use: Never used  Substance and Sexual Activity   Alcohol use: Yes    Alcohol/week: 4.0 standard drinks of alcohol    Types: 4 Standard drinks or equivalent per week    Comment: occasional wine    Drug use: No   Sexual activity: Never    Comment: lives alone, no dietary restrictions, widowed  Other Topics Concern   Not on file  Social History Narrative   Not on file   Social Determinants of Health   Financial Resource Strain: Not on file  Food Insecurity: Not on file  Transportation Needs: Not on file  Physical Activity: Not on file  Stress: Not on file  Social Connections: Not on file     Family History: The patient's family history includes Aneurysm in her daughter; Cancer in her sister; Diabetes in her daughter; Heart attack in her father; Heart disease in her brother, daughter, father, and sister; Hip fracture in her mother; Hyperlipidemia in her daughter and father; Hypertension in her daughter; Other in her mother. There is no history of Colon cancer.  ROS:   Please see the history of present illness.     All other systems reviewed and are negative.  EKGs/Labs/Other Studies Reviewed:    The following studies were reviewed today:  Echo 05/02/17: Study Conclusions   - Left ventricle: The cavity size was normal. Wall thickness was    increased in a pattern of mild LVH. Systolic function was normal.    The estimated ejection fraction was in the range of 55% to 60%.    Wall motion was normal; there were no regional wall motion    abnormalities. Doppler parameters are consistent with abnormal    left ventricular relaxation (grade 1 diastolic dysfunction).  - Aortic valve: There was no stenosis.  - Mitral valve: Mildly calcified annulus. There was no significant    regurgitation.  - Left atrium: The atrium was mildly dilated.  - Right ventricle: The cavity size was normal. Systolic function    was normal.  - Pulmonary arteries: PA peak pressure: 29 mm Hg (S).  -  Systemic veins: IVC measured 2.2 cm with normal respirophasic    variation, suggesting RA pressure 8 mmHg.   Impressions:   - Nomral LV size with mild LV hypertrophy. EF 55-60%. Normal RV    size and systolic function. No significant valvular    abnormalities.   Myoview 07/03/2021 The study is normal. Findings are consistent with no prior ischemia and no prior myocardial infarction. The study is low risk.   No ST deviation was noted.   LV perfusion is normal. There is no evidence of inducible ischemia and infarction.   Nuclear stress  EF: 89 %. Left ventricular function is normal.   No ischemia or infarction on perfusion images. Normal wall motion.     Event monitor 03/20/22: Study Highlights    Normal sinus rhythm Rare PACs and PVCs Several brief runs of SVT. typically 3-5 beats. one episode lasting 18 beats (10 seconds)     Patch Wear Time:  14 days and 0 hours (2023-04-17T12:12:54-0400 to 2023-05-01T12:12:58-0400)   Patient had a min HR of 55 bpm, max HR of 169 bpm, and avg HR of 67 bpm. Predominant underlying rhythm was Sinus Rhythm. 16 Supraventricular Tachycardia runs occurred, the run with the fastest interval lasting 5 beats with a max rate of 169 bpm, the  longest lasting 18 beats with an avg rate of 106 bpm. Isolated SVEs were rare (<1.0%), SVE Couplets were rare (<1.0%), and SVE Triplets were rare (<1.0%). Isolated VEs were rare (<1.0%), and no VE Couplets or VE Triplets were present.   EKG:  EKG is not ordered today.    Recent Labs: 12/18/2021: Hemoglobin 13.1; Platelets 230.0 09/05/2022: ALT 6; BUN 16; Creatinine, Ser 1.04; Potassium 4.5; Sodium 140; TSH 2.40  Recent Lipid Panel    Component Value Date/Time   CHOL 204 (H) 09/05/2022 1229   TRIG 150.0 (H) 09/05/2022 1229   HDL 53.20 09/05/2022 1229   CHOLHDL 4 09/05/2022 1229   VLDL 30.0 09/05/2022 1229   LDLCALC 121 (H) 09/05/2022 1229   LDLCALC 116 (H) 09/13/2020 1044   LDLDIRECT 104.0 12/18/2021 1616      Risk Assessment/Calculations:           Physical Exam:    VS:  There were no vitals taken for this visit.    Wt Readings from Last 3 Encounters:  09/05/22 151 lb (68.5 kg)  04/24/22 154 lb (69.9 kg)  02/22/22 147 lb (66.7 kg)     GEN:  Well nourished, well developed in no acute distress HEENT: Normal NECK: No JVD; No carotid bruits LYMPHATICS: No lymphadenopathy CARDIAC: RRR, no murmurs, rubs, gallops RESPIRATORY:  Clear to auscultation without rales, wheezing or rhonchi  ABDOMEN: Soft, non-tender, non-distended MUSCULOSKELETAL:  No edema; No deformity  SKIN: Warm and dry NEUROLOGIC:  Alert and oriented x 3 PSYCHIATRIC:  Normal affect   ASSESSMENT:    No diagnosis found.  PLAN:    In order of problems listed above:  Palpitation: some brief nonsustained SVT and rare PVCs.   Hypertension: Blood pressure stable            Medication Adjustments/Labs and Tests Ordered: Current medicines are reviewed at length with the patient today.  Concerns regarding medicines are outlined above.  No orders of the defined types were placed in this encounter.  No orders of the defined types were placed in this encounter.   There are no Patient Instructions on file for this visit.   Signed, Llesenia Fogal Martinique, MD  11/20/2022 4:25 PM    [facility]

## 2022-11-21 ENCOUNTER — Encounter: Payer: Self-pay | Admitting: Cardiology

## 2022-11-21 ENCOUNTER — Ambulatory Visit: Payer: Medicare Other | Attending: Cardiology | Admitting: Cardiology

## 2022-11-21 VITALS — BP 131/66 | HR 63 | Ht 61.0 in | Wt 150.0 lb

## 2022-11-21 DIAGNOSIS — R002 Palpitations: Secondary | ICD-10-CM

## 2022-11-21 DIAGNOSIS — I10 Essential (primary) hypertension: Secondary | ICD-10-CM | POA: Diagnosis not present

## 2022-11-21 DIAGNOSIS — I6523 Occlusion and stenosis of bilateral carotid arteries: Secondary | ICD-10-CM | POA: Diagnosis not present

## 2022-11-21 DIAGNOSIS — E782 Mixed hyperlipidemia: Secondary | ICD-10-CM | POA: Insufficient documentation

## 2022-11-21 NOTE — Patient Instructions (Signed)
Medication Instructions:  Continue same medications *If you need a refill on your cardiac medications before your next appointment, please call your pharmacy*   Lab Work: None ordered   Testing/Procedures: Carotid dopplers   Follow-Up: At [facility], you and your health needs are our priority.  As part of our continuing mission to provide you with exceptional heart care, we have created designated Provider Care Teams.  These Care Teams include your primary Cardiologist (physician) and Advanced Practice Providers (APPs -  Physician Assistants and Nurse Practitioners) who all work together to provide you with the care you need, when you need it.  We recommend signing up for the patient portal called "MyChart".  Sign up information is provided on this After Visit Summary.  MyChart is used to connect with patients for Virtual Visits (Telemedicine).  Patients are able to view lab/test results, encounter notes, upcoming appointments, etc.  Non-urgent messages can be sent to your provider as well.   To learn more about what you can do with MyChart, go to [email or web address].    Your next appointment:  1 year    Call in Oct to schedule Jan appointment     The format for your next appointment: Office   Provider:  Dr.Jordan   Important Information About Sugar

## 2022-11-21 NOTE — Addendum Note (Signed)
Addended by: Kathyrn Lass on: 11/21/2022 03:26 PM   Modules accepted: Orders

## 2022-11-27 ENCOUNTER — Ambulatory Visit (HOSPITAL_COMMUNITY)
Admission: RE | Admit: 2022-11-27 | Discharge: 2022-11-27 | Disposition: A | Discharge status: Home / Self Care | Payer: Medicare Other | Source: Ambulatory Visit | Attending: Cardiology | Admitting: Cardiology

## 2022-11-27 DIAGNOSIS — R002 Palpitations: Secondary | ICD-10-CM | POA: Insufficient documentation

## 2022-11-27 DIAGNOSIS — E782 Mixed hyperlipidemia: Secondary | ICD-10-CM | POA: Diagnosis not present

## 2022-11-27 DIAGNOSIS — I6523 Occlusion and stenosis of bilateral carotid arteries: Secondary | ICD-10-CM

## 2022-11-27 DIAGNOSIS — I10 Essential (primary) hypertension: Secondary | ICD-10-CM

## 2023-01-25 DIAGNOSIS — R059 Cough, unspecified: Secondary | ICD-10-CM | POA: Diagnosis not present

## 2023-01-25 DIAGNOSIS — R062 Wheezing: Secondary | ICD-10-CM | POA: Diagnosis not present

## 2023-01-25 DIAGNOSIS — R0600 Dyspnea, unspecified: Secondary | ICD-10-CM | POA: Diagnosis not present

## 2023-02-06 ENCOUNTER — Ambulatory Visit: Payer: Medicare Other | Admitting: Cardiology

## 2023-04-25 ENCOUNTER — Other Ambulatory Visit: Payer: Self-pay | Admitting: Family Medicine

## 2023-04-25 ENCOUNTER — Telehealth: Payer: Self-pay | Admitting: Family Medicine

## 2023-04-25 DIAGNOSIS — H8110 Benign paroxysmal vertigo, unspecified ear: Secondary | ICD-10-CM

## 2023-04-25 NOTE — Telephone Encounter (Signed)
Please advise 

## 2023-04-25 NOTE — Telephone Encounter (Signed)
Pt called to see if Dr. Abner Greenspan would refer her to [facility] on [address] for dizziness. Pt said she has had it for years and has seen this provider before but they need referral from PCP.   [phone] (office) [phone] (fax)

## 2023-05-01 DIAGNOSIS — I951 Orthostatic hypotension: Secondary | ICD-10-CM | POA: Diagnosis not present

## 2023-05-01 DIAGNOSIS — R42 Dizziness and giddiness: Secondary | ICD-10-CM | POA: Diagnosis not present

## 2023-05-01 DIAGNOSIS — H8113 Benign paroxysmal vertigo, bilateral: Secondary | ICD-10-CM | POA: Diagnosis not present

## 2023-05-06 DIAGNOSIS — R42 Dizziness and giddiness: Secondary | ICD-10-CM | POA: Diagnosis not present

## 2023-05-06 DIAGNOSIS — I951 Orthostatic hypotension: Secondary | ICD-10-CM | POA: Diagnosis not present

## 2023-05-06 DIAGNOSIS — H8113 Benign paroxysmal vertigo, bilateral: Secondary | ICD-10-CM | POA: Diagnosis not present

## 2023-05-08 DIAGNOSIS — I951 Orthostatic hypotension: Secondary | ICD-10-CM | POA: Diagnosis not present

## 2023-05-08 DIAGNOSIS — H8113 Benign paroxysmal vertigo, bilateral: Secondary | ICD-10-CM | POA: Diagnosis not present

## 2023-05-08 DIAGNOSIS — R42 Dizziness and giddiness: Secondary | ICD-10-CM | POA: Diagnosis not present

## 2023-05-14 DIAGNOSIS — H8113 Benign paroxysmal vertigo, bilateral: Secondary | ICD-10-CM | POA: Diagnosis not present

## 2023-05-14 DIAGNOSIS — R42 Dizziness and giddiness: Secondary | ICD-10-CM | POA: Diagnosis not present

## 2023-05-14 DIAGNOSIS — I951 Orthostatic hypotension: Secondary | ICD-10-CM | POA: Diagnosis not present

## 2023-05-22 DIAGNOSIS — H8113 Benign paroxysmal vertigo, bilateral: Secondary | ICD-10-CM | POA: Diagnosis not present

## 2023-05-22 DIAGNOSIS — I951 Orthostatic hypotension: Secondary | ICD-10-CM | POA: Diagnosis not present

## 2023-05-22 DIAGNOSIS — R42 Dizziness and giddiness: Secondary | ICD-10-CM | POA: Diagnosis not present

## 2023-05-27 DIAGNOSIS — H8113 Benign paroxysmal vertigo, bilateral: Secondary | ICD-10-CM | POA: Diagnosis not present

## 2023-05-27 DIAGNOSIS — R42 Dizziness and giddiness: Secondary | ICD-10-CM | POA: Diagnosis not present

## 2023-05-27 DIAGNOSIS — I951 Orthostatic hypotension: Secondary | ICD-10-CM | POA: Diagnosis not present

## 2023-05-29 DIAGNOSIS — R42 Dizziness and giddiness: Secondary | ICD-10-CM | POA: Diagnosis not present

## 2023-05-29 DIAGNOSIS — I951 Orthostatic hypotension: Secondary | ICD-10-CM | POA: Diagnosis not present

## 2023-05-29 DIAGNOSIS — H8113 Benign paroxysmal vertigo, bilateral: Secondary | ICD-10-CM | POA: Diagnosis not present

## 2023-05-31 ENCOUNTER — Telehealth: Payer: Self-pay

## 2023-05-31 NOTE — Telephone Encounter (Signed)
Pt states that she needs refill on synthroid, gabapentin , and metoprolol sent to [facility]

## 2023-06-03 ENCOUNTER — Other Ambulatory Visit: Payer: Self-pay

## 2023-06-03 DIAGNOSIS — H8113 Benign paroxysmal vertigo, bilateral: Secondary | ICD-10-CM | POA: Diagnosis not present

## 2023-06-03 DIAGNOSIS — I10 Essential (primary) hypertension: Secondary | ICD-10-CM

## 2023-06-03 DIAGNOSIS — M109 Gout, unspecified: Secondary | ICD-10-CM

## 2023-06-03 DIAGNOSIS — I951 Orthostatic hypotension: Secondary | ICD-10-CM | POA: Diagnosis not present

## 2023-06-03 DIAGNOSIS — R42 Dizziness and giddiness: Secondary | ICD-10-CM | POA: Diagnosis not present

## 2023-06-03 MED ORDER — METOPROLOL SUCCINATE ER 100 MG PO TB24: 100.0000 mg | ORAL_TABLET | Freq: Every day | ORAL | 3 refills | Status: DC

## 2023-06-03 MED ORDER — GABAPENTIN 300 MG PO CAPS: 300.0000 mg | ORAL_CAPSULE | Freq: Every day | ORAL | 1 refills | Status: DC

## 2023-06-03 MED ORDER — LOSARTAN POTASSIUM 25 MG PO TABS: 25.0000 mg | ORAL_TABLET | Freq: Every day | ORAL | 3 refills | Status: DC

## 2023-06-03 NOTE — Telephone Encounter (Signed)
Refill sent and called pt haven't been seen. Appt scheduled  For follow up.

## 2023-06-05 DIAGNOSIS — I951 Orthostatic hypotension: Secondary | ICD-10-CM | POA: Diagnosis not present

## 2023-06-05 DIAGNOSIS — R42 Dizziness and giddiness: Secondary | ICD-10-CM | POA: Diagnosis not present

## 2023-06-05 DIAGNOSIS — H8113 Benign paroxysmal vertigo, bilateral: Secondary | ICD-10-CM | POA: Diagnosis not present

## 2023-06-10 DIAGNOSIS — H8113 Benign paroxysmal vertigo, bilateral: Secondary | ICD-10-CM | POA: Diagnosis not present

## 2023-06-10 DIAGNOSIS — R42 Dizziness and giddiness: Secondary | ICD-10-CM | POA: Diagnosis not present

## 2023-06-10 DIAGNOSIS — I951 Orthostatic hypotension: Secondary | ICD-10-CM | POA: Diagnosis not present

## 2023-06-10 NOTE — Progress Notes (Unsigned)
Subjective:    Patient ID: Christina Mckinney, female    DOB: Aug 21, 1932, 89 y.o.   MRN: [id number]  No chief complaint on file.   HPI Discussed the use of AI scribe software for clinical note transcription with the patient, who gave verbal consent to proceed.  History of Present Illness   Patient is an 89 yo female in today for follow up on chronic medical concerns. No recent febrile illness or hospitalizations. Denies CP/palp/SOB/HA/congestion/fevers/GI or GU c/o. Taking meds as prescribed           Past Medical History:  Diagnosis Date  . Anemia, unspecified   . Anxiety and depression 09/18/2015  . Anxiety state, unspecified   . Chest pain   . Chronic systolic CHF (congestive heart failure) (HCC) 08/12/2015  . Depression with anxiety 06/02/2007   Qualifier: Diagnosis of  By: Nena Jordan   . Depressive disorder, not elsewhere classified   . Diverticulitis   . DVT (deep venous thrombosis) (HCC)   . Dyspnea 07/22/2015  . Edema 06/12/2014  . Essential and other specified forms of tremor   . GERD (gastroesophageal reflux disease)   . Gout, unspecified   . Internal hemorrhoids without mention of complication   . Macular degeneration 08/07/2015  . Osteoarthritis   . Other abnormal glucose   . Other and unspecified hyperlipidemia   . Personal history of colonic polyps   . Personal history of peptic ulcer disease   . Pulmonary embolism (HCC)   . Unspecified disorder of bladder   . Unspecified essential hypertension   . Unspecified hypothyroidism   . Varicose veins     Past Surgical History:  Procedure Laterality Date  . ABDOMINAL HYSTERECTOMY    . CHOLECYSTECTOMY    . ENDOVENOUS ABLATION SAPHENOUS VEIN W/ LASER  09-11-2012   left greater saphenous vein   by Gretta Began MD  . EYE SURGERY    . KNEE ARTHROSCOPY     right   . NASAL SEPTUM SURGERY    . SHOULDER SURGERY    . TOTAL KNEE ARTHROPLASTY  09/2013   Right    Family History  Problem Relation Age of Onset  .  Other Mother        varicose veins  . Hip fracture Mother   . Heart disease Father        CHF  . Hyperlipidemia Father   . Heart attack Father   . Cancer Sister        stomach  . Heart disease Sister   . Heart disease Brother   . Diabetes Daughter   . Heart disease Daughter   . Hyperlipidemia Daughter   . Hypertension Daughter   . Aneurysm Daughter   . Colon cancer Neg Hx     Social History   Socioeconomic History  . Marital status: Widowed    Spouse name: Not on file  . Number of children: 3  . Years of education: Not on file  . Highest education level: Not on file  Occupational History  . Occupation: Retired   Tobacco Use  . Smoking status: Former    Current packs/day: 0.00    Types: Cigarettes    Start date: 11/12/1948    Quit date: 11/12/1988    Years since quitting: 34.5  . Smokeless tobacco: Never  Vaping Use  . Vaping status: Never Used  Substance and Sexual Activity  . Alcohol use: Yes    Alcohol/week: 4.0 standard drinks of alcohol  Types: 4 Standard drinks or equivalent per week    Comment: occasional wine   . Drug use: No  . Sexual activity: Never    Comment: lives alone, no dietary restrictions, widowed  Other Topics Concern  . Not on file  Social History Narrative  . Not on file   Social Determinants of Health   Financial Resource Strain: Not on file  Food Insecurity: Not on file  Transportation Needs: Not on file  Physical Activity: Not on file  Stress: Not on file  Social Connections: Not on file  Intimate Partner Violence: Not on file    Outpatient Medications Prior to Visit  Medication Sig Dispense Refill  . albuterol (VENTOLIN HFA) 108 (90 Base) MCG/ACT inhaler USE 2 INHALATIONS EVERY 6 HOURS AS NEEDED FOR WHEEZING OR SHORTNESS OF BREATH 51 g 3  . ALPRAZolam (XANAX) 0.5 MG tablet Take 1 tablet (0.5 mg total) by mouth at bedtime as needed for anxiety. 30 tablet 0  . aspirin 81 MG tablet Take 1 tablet (81 mg total) by mouth daily. 30  tablet   . co-enzyme Q-10 30 MG capsule Take 30 mg by mouth daily.    . fexofenadine (ALLEGRA ALLERGY) 180 MG tablet Take 1 tablet (180 mg total) by mouth daily. 90 tablet 1  . furosemide (LASIX) 40 MG tablet Take 1 tablet (40 mg total) by mouth daily. 90 tablet 3  . gabapentin (NEURONTIN) 300 MG capsule Take 1 capsule (300 mg total) by mouth at bedtime. 90 capsule 1  . guaiFENesin (MUCINEX) 600 MG 12 hr tablet Take 1,200 mg by mouth 2 (two) times daily as needed for to loosen phlegm.     Marland Kitchen losartan (COZAAR) 25 MG tablet Take 1 tablet (25 mg total) by mouth daily. 90 tablet 3  . metoprolol succinate (TOPROL-XL) 100 MG 24 hr tablet Take 1 tablet (100 mg total) by mouth daily. Take with or immediately following a meal.TAKE 1 TABLET DAILY WITH OR IMMEDIATELY FOLLOWING A MEAL 90 tablet 3  . mupirocin cream (BACTROBAN) 2 % Apply 1 application topically 2 (two) times daily. Apply to the nose twice a day x 1 week 15 g 1  . pantoprazole (PROTONIX) 40 MG tablet TAKE 1 TABLET TWICE A DAY BEFORE MEALS 180 tablet 1  . SYNTHROID 50 MCG tablet TAKE 1 TABLET DAILY BEFORE BREAKFAST 90 tablet 3   No facility-administered medications prior to visit.    Allergies  Allergen Reactions  . Simvastatin Other (See Comments)    Hip pain  . Crestor [Rosuvastatin]     Left hip pain  . Lipitor [Atorvastatin]     Pain in hips  . Red Dye     Hip pain  . Statins Other (See Comments)    Muscle aches, could not walk    Review of Systems  Constitutional:  Negative for fever and malaise/fatigue.  HENT:  Negative for congestion.   Eyes:  Negative for blurred vision.  Respiratory:  Negative for shortness of breath.   Cardiovascular:  Negative for chest pain, palpitations and leg swelling.  Gastrointestinal:  Negative for abdominal pain, blood in stool and nausea.  Genitourinary:  Negative for dysuria and frequency.  Musculoskeletal:  Negative for falls.  Skin:  Negative for rash.  Neurological:  Negative for  dizziness, loss of consciousness and headaches.  Endo/Heme/Allergies:  Negative for environmental allergies.  Psychiatric/Behavioral:  Negative for depression. The patient is not nervous/anxious.        Objective:    Physical Exam  Constitutional:      General: She is not in acute distress.    Appearance: Normal appearance. She is well-developed. She is not toxic-appearing.  HENT:     Head: Normocephalic and atraumatic.     Right Ear: External ear normal.     Left Ear: External ear normal.     Nose: Nose normal.  Eyes:     General:        Right eye: No discharge.        Left eye: No discharge.     Conjunctiva/sclera: Conjunctivae normal.  Neck:     Thyroid: No thyromegaly.  Cardiovascular:     Rate and Rhythm: Normal rate and regular rhythm.     Heart sounds: Normal heart sounds. No murmur heard. Pulmonary:     Effort: Pulmonary effort is normal. No respiratory distress.     Breath sounds: Normal breath sounds.  Abdominal:     General: Bowel sounds are normal.     Palpations: Abdomen is soft.     Tenderness: There is no abdominal tenderness. There is no guarding.  Musculoskeletal:        General: Normal range of motion.     Cervical back: Neck supple.  Lymphadenopathy:     Cervical: No cervical adenopathy.  Skin:    General: Skin is warm and dry.  Neurological:     Mental Status: She is alert and oriented to person, place, and time.  Psychiatric:        Mood and Affect: Mood normal.        Behavior: Behavior normal.        Thought Content: Thought content normal.        Judgment: Judgment normal.   There were no vitals taken for this visit. Wt Readings from Last 3 Encounters:  11/21/22 150 lb (68 kg)  09/05/22 151 lb (68.5 kg)  04/24/22 154 lb (69.9 kg)    Diabetic Foot Exam - Simple   No data filed    Lab Results  Component Value Date   WBC 8.3 12/18/2021   HGB 13.1 12/18/2021   HCT 40.6 12/18/2021   PLT 230.0 12/18/2021   GLUCOSE 104 (H) 09/05/2022    CHOL 204 (H) 09/05/2022   TRIG 150.0 (H) 09/05/2022   HDL 53.20 09/05/2022   LDLDIRECT 104.0 12/18/2021   LDLCALC 121 (H) 09/05/2022   ALT 6 09/05/2022   AST 19 09/05/2022   NA 140 09/05/2022   K 4.5 09/05/2022   CL 104 09/05/2022   CREATININE 1.04 09/05/2022   BUN 16 09/05/2022   CO2 30 09/05/2022   TSH 2.40 09/05/2022   INR 1.0 07/21/2010   HGBA1C 5.9 09/05/2022   MICROALBUR 0.9 01/31/2016    Lab Results  Component Value Date   TSH 2.40 09/05/2022   Lab Results  Component Value Date   WBC 8.3 12/18/2021   HGB 13.1 12/18/2021   HCT 40.6 12/18/2021   MCV 96.3 12/18/2021   PLT 230.0 12/18/2021   Lab Results  Component Value Date   NA 140 09/05/2022   K 4.5 09/05/2022   CO2 30 09/05/2022   GLUCOSE 104 (H) 09/05/2022   BUN 16 09/05/2022   CREATININE 1.04 09/05/2022   BILITOT 0.6 09/05/2022   ALKPHOS 84 09/05/2022   AST 19 09/05/2022   ALT 6 09/05/2022   PROT 6.9 09/05/2022   ALBUMIN 4.1 09/05/2022   CALCIUM 10.3 09/05/2022   ANIONGAP 9 12/21/2018   GFR 47.94 (L) 09/05/2022   Lab  Results  Component Value Date   CHOL 204 (H) 09/05/2022   Lab Results  Component Value Date   HDL 53.20 09/05/2022   Lab Results  Component Value Date   LDLCALC 121 (H) 09/05/2022   Lab Results  Component Value Date   TRIG 150.0 (H) 09/05/2022   Lab Results  Component Value Date   CHOLHDL 4 09/05/2022   Lab Results  Component Value Date   HGBA1C 5.9 09/05/2022       Assessment & Plan:  Chronic insomnia Assessment & Plan: Encouraged good sleep hygiene such as dark, quiet room. No blue/green glowing lights such as computer screens in bedroom. No alcohol or stimulants in evening. Cut down on caffeine as able. Regular exercise is helpful but not just prior to bed time.     Chronic systolic CHF (congestive heart failure) (HCC) Assessment & Plan: No recent exacerbation. No changes to therapy today   Chronic obstructive pulmonary disease, unspecified COPD type  (HCC) Assessment & Plan: No recent exacerbation   Chronic renal impairment, unspecified CKD stage Assessment & Plan: Hydrate and monitor    Diabetes mellitus without complication (HCC) Assessment & Plan: hgba1c acceptable, minimize simple carbs. Increase exercise as tolerated. Continue current meds   Gout, unspecified cause, unspecified chronicity, unspecified site Assessment & Plan: No recent flares. Uloric to 40 mg qod   Hyperlipidemia, unspecified hyperlipidemia type Assessment & Plan: Encourage heart healthy diet such as MIND or DASH diet, increase exercise, avoid trans fats, simple carbohydrates and processed foods, consider a krill or fish or flaxseed oil cap daily.    Hypothyroidism, unspecified type Assessment & Plan: On Levothyroxine, continue to monitor      Assessment and Plan              Danise Edge, MD

## 2023-06-10 NOTE — Assessment & Plan Note (Signed)
Encourage heart healthy diet such as MIND or DASH diet, increase exercise, avoid trans fats, simple carbohydrates and processed foods, consider a krill or fish or flaxseed oil cap daily.  °

## 2023-06-10 NOTE — Assessment & Plan Note (Signed)
Encouraged good sleep hygiene such as dark, quiet room. No blue/green glowing lights such as computer screens in bedroom. No alcohol or stimulants in evening. Cut down on caffeine as able. Regular exercise is helpful but not just prior to bed time.  

## 2023-06-10 NOTE — Assessment & Plan Note (Signed)
Hydrate and monitor 

## 2023-06-10 NOTE — Assessment & Plan Note (Signed)
No recent exacerbation. No changes to therapy today

## 2023-06-10 NOTE — Assessment & Plan Note (Signed)
On Levothyroxine, continue to monitor 

## 2023-06-10 NOTE — Assessment & Plan Note (Signed)
No recent exacerbation 

## 2023-06-10 NOTE — Assessment & Plan Note (Signed)
hgba1c acceptable, minimize simple carbs. Increase exercise as tolerated. Continue current meds 

## 2023-06-10 NOTE — Assessment & Plan Note (Addendum)
Has had a flare in big toe of right foot. Was not taking Uloric to 40 mg every day but will restart, it is sent in

## 2023-06-11 ENCOUNTER — Ambulatory Visit (INDEPENDENT_AMBULATORY_CARE_PROVIDER_SITE_OTHER): Payer: Medicare Other | Admitting: Family Medicine

## 2023-06-11 VITALS — BP 128/64 | HR 72 | Temp 97.5°F | Resp 16 | Ht 63.0 in | Wt 150.0 lb

## 2023-06-11 DIAGNOSIS — E119 Type 2 diabetes mellitus without complications: Secondary | ICD-10-CM

## 2023-06-11 DIAGNOSIS — N189 Chronic kidney disease, unspecified: Secondary | ICD-10-CM

## 2023-06-11 DIAGNOSIS — E039 Hypothyroidism, unspecified: Secondary | ICD-10-CM | POA: Diagnosis not present

## 2023-06-11 DIAGNOSIS — J449 Chronic obstructive pulmonary disease, unspecified: Secondary | ICD-10-CM

## 2023-06-11 DIAGNOSIS — E785 Hyperlipidemia, unspecified: Secondary | ICD-10-CM | POA: Diagnosis not present

## 2023-06-11 DIAGNOSIS — M109 Gout, unspecified: Secondary | ICD-10-CM

## 2023-06-11 DIAGNOSIS — R002 Palpitations: Secondary | ICD-10-CM

## 2023-06-11 DIAGNOSIS — R3911 Hesitancy of micturition: Secondary | ICD-10-CM | POA: Diagnosis not present

## 2023-06-11 DIAGNOSIS — I5022 Chronic systolic (congestive) heart failure: Secondary | ICD-10-CM | POA: Diagnosis not present

## 2023-06-11 DIAGNOSIS — F5104 Psychophysiologic insomnia: Secondary | ICD-10-CM

## 2023-06-11 DIAGNOSIS — R001 Bradycardia, unspecified: Secondary | ICD-10-CM

## 2023-06-11 MED ORDER — METOPROLOL SUCCINATE ER 50 MG PO TB24: 50.0000 mg | ORAL_TABLET | Freq: Every day | ORAL | 1 refills | Status: DC

## 2023-06-11 MED ORDER — PANTOPRAZOLE SODIUM 40 MG PO TBEC: 40.0000 mg | DELAYED_RELEASE_TABLET | Freq: Every day | ORAL | Status: DC | PRN

## 2023-06-11 MED ORDER — FEBUXOSTAT 40 MG PO TABS: 40.0000 mg | ORAL_TABLET | Freq: Every day | ORAL | 1 refills | Status: DC

## 2023-06-11 NOTE — Patient Instructions (Addendum)
Plain Mucinex twice daily 60-80 ounces broken into 10 ounces every one to two hours. Increase your protein intake  Sarna anti itch lotion  The Blue Zones on Netflix  Magnesium Glycinate 200-400 mg at bedtime for sleep  L Tryptophan capsules for sleep  Cartia Mobile to trace your heart when you have palpitations Drop the Metoprolol Succinate to 50 mg daily, (1/2 of the 100 mg tabs once daily is sufficient until you run out of your supply)

## 2023-06-12 ENCOUNTER — Other Ambulatory Visit: Payer: Self-pay | Admitting: Family Medicine

## 2023-06-12 DIAGNOSIS — N189 Chronic kidney disease, unspecified: Secondary | ICD-10-CM

## 2023-06-13 ENCOUNTER — Other Ambulatory Visit: Payer: Self-pay

## 2023-06-13 ENCOUNTER — Ambulatory Visit (INDEPENDENT_AMBULATORY_CARE_PROVIDER_SITE_OTHER): Payer: Medicare Other

## 2023-06-13 DIAGNOSIS — E559 Vitamin D deficiency, unspecified: Secondary | ICD-10-CM | POA: Diagnosis not present

## 2023-06-13 DIAGNOSIS — I5022 Chronic systolic (congestive) heart failure: Secondary | ICD-10-CM

## 2023-06-13 DIAGNOSIS — N189 Chronic kidney disease, unspecified: Secondary | ICD-10-CM

## 2023-06-13 LAB — VITAMIN D 25 HYDROXY (VIT D DEFICIENCY, FRACTURES): VITD: 67.48 ng/mL (ref 30.00–100.00)

## 2023-06-14 ENCOUNTER — Other Ambulatory Visit: Payer: Self-pay | Admitting: *Deleted

## 2023-07-09 ENCOUNTER — Ambulatory Visit
Admission: EM | Admit: 2023-07-09 | Discharge: 2023-07-09 | Disposition: A | Discharge status: Home / Self Care | Payer: TRICARE For Life (TFL) | Attending: Physician Assistant | Admitting: Physician Assistant

## 2023-07-09 DIAGNOSIS — H1033 Unspecified acute conjunctivitis, bilateral: Secondary | ICD-10-CM | POA: Diagnosis not present

## 2023-07-09 MED ORDER — POLYMYXIN B-TRIMETHOPRIM 10000-0.1 UNIT/ML-% OP SOLN
1.0000 [drp] | OPHTHALMIC | 0 refills | Status: AC
Start: 2023-07-09 — End: 2023-07-16

## 2023-07-09 NOTE — ED Provider Notes (Signed)
[facility]    CSN: [id number] Arrival date & time: 07/09/23  1055      History   Chief Complaint Chief Complaint  Patient presents with   Eye Problem    HPI Christina Mckinney is a 89 y.o. female.   Patient here today for evaluation of discharge from her eyes that causes them to be matted shut in the mornings. She has had some itching. She denies pain, headache, nausea or vomiting. She has been using pataday drops without improvement.   The history is provided by the patient.  Eye Problem Associated symptoms: discharge, itching and redness   Associated symptoms: no headaches, no nausea, no photophobia and no vomiting     Past Medical History:  Diagnosis Date   Anemia, unspecified    Anxiety and depression 09/18/2015   Anxiety state, unspecified    Chest pain    Chronic systolic CHF (congestive heart failure) (HCC) 08/12/2015   Depression with anxiety 06/02/2007   Qualifier: Diagnosis of  By: Nena Jordan    Depressive disorder, not elsewhere classified    Diverticulitis    DVT (deep venous thrombosis) (HCC)    Dyspnea 07/22/2015   Edema 06/12/2014   Essential and other specified forms of tremor    GERD (gastroesophageal reflux disease)    Gout, unspecified    Internal hemorrhoids without mention of complication    Macular degeneration 08/07/2015   Osteoarthritis    Other abnormal glucose    Other and unspecified hyperlipidemia    Personal history of colonic polyps    Personal history of peptic ulcer disease    Pulmonary embolism (HCC)    Unspecified disorder of bladder    Unspecified essential hypertension    Unspecified hypothyroidism    Varicose veins     Patient Active Problem List   Diagnosis Date Noted   Pinworm infection 12/18/2021   Hypothyroid 08/11/2021   Myalgia due to statin 08/11/2021   PVD (peripheral vascular disease) (HCC) 08/10/2021   Mass of left axilla 04/25/2021   Acute bilateral low back pain without sciatica 04/25/2021    Palpitations 09/14/2020   CRI (chronic renal insufficiency) 03/03/2019   High vitamin D level 01/25/2019   Hypercalcemia 01/25/2019   Hyperparathyroidism (HCC) 01/25/2019   Ganglion cyst 09/07/2018   Hoarseness of voice 09/07/2018   Sleep apnea 09/04/2018   Other fatigue 12/04/2016   Anxiety and depression 09/18/2015   Hypertensive heart disease 08/12/2015   Chronic systolic CHF (congestive heart failure) (HCC) 08/12/2015   Macular degeneration 08/07/2015   Diminished pulses in lower extremity 11/01/2014   Left hip pain 07/12/2014   Dermatitis 06/15/2013   Chest pain 04/13/2013   Iliotibial band syndrome of left side 03/12/2013   Varicose veins of lower extremities with other complications 05/07/2012   Carotid stenosis 04/29/2012   Pulmonary nodule 04/18/2012   Constipation 04/18/2012   Right knee pain 11/09/2011   COPD (chronic obstructive pulmonary disease) (HCC) 10/12/2011   Chronic insomnia 07/06/2011   BLADDER PROLAPSE 11/07/2010   Anemia 10/19/2010   BENIGN POSITIONAL VERTIGO 10/19/2010   Bilateral carotid artery disease (HCC) 05/09/2010   Peripheral neuropathy 01/19/2010   Pain in joint, lower leg 08/01/2009   FLANK PAIN, RIGHT 07/26/2009   INTERNAL HEMORRHOIDS 06/09/2009   RESTLESS LEG SYNDROME 05/24/2009   UNSPECIFIED SUBJECTIVE VISUAL DISTURBANCE 04/28/2009   HOT FLASHES 04/28/2009   Diabetes mellitus without complication (HCC) 02/28/2009   TREMOR, ESSENTIAL 10/29/2008   Gout 04/01/2008   Thyroid disease 06/02/2007  Hyperlipidemia 06/02/2007   Primary hypertension 06/02/2007   GERD 06/02/2007   DIVERTICULOSIS, COLON 06/02/2007   HX, PERSONAL, PEPTIC ULCER DISEASE 06/02/2007   COLONIC POLYPS, HX OF 06/02/2007   DIVERTICULITIS, HX OF 06/02/2007   Osteoarthritis 06/02/2007    Past Surgical History:  Procedure Laterality Date   ABDOMINAL HYSTERECTOMY     CHOLECYSTECTOMY     ENDOVENOUS ABLATION SAPHENOUS VEIN W/ LASER  09-11-2012   left greater saphenous  vein   by Gretta Began MD   EYE SURGERY     KNEE ARTHROSCOPY     right    NASAL SEPTUM SURGERY     SHOULDER SURGERY     TOTAL KNEE ARTHROPLASTY  09/2013   Right    OB History   No obstetric history on file.      Home Medications    Prior to Admission medications   Medication Sig Start Date End Date Taking? Authorizing Provider  trimethoprim-polymyxin b (POLYTRIM) ophthalmic solution Place 1 drop into both eyes every 4 (four) hours for 7 days. 07/09/23 07/16/23 Yes Tomi Bamberger, PA-C  albuterol (VENTOLIN HFA) 108 (90 Base) MCG/ACT inhaler USE 2 INHALATIONS EVERY 6 HOURS AS NEEDED FOR WHEEZING OR SHORTNESS OF BREATH 08/28/21   Bradd Canary, MD  ALPRAZolam Prudy Feeler) 0.5 MG tablet Take 1 tablet (0.5 mg total) by mouth at bedtime as needed for anxiety. 09/05/22   Sandford Craze, NP  aspirin 81 MG tablet Take 1 tablet (81 mg total) by mouth daily. 04/29/12   Yoo, Doe-Hyun R, DO  co-enzyme Q-10 30 MG capsule Take 30 mg by mouth daily.    [provider]  febuxostat (ULORIC) 40 MG tablet Take 1 tablet (40 mg total) by mouth daily. 06/11/23   Bradd Canary, MD  fexofenadine [facility] ALLERGY) 180 MG tablet Take 1 tablet (180 mg total) by mouth daily. 09/04/18   Bradd Canary, MD  furosemide (LASIX) 40 MG tablet Take 1 tablet (40 mg total) by mouth daily. 09/05/22   Sandford Craze, NP  guaiFENesin (MUCINEX) 600 MG 12 hr tablet Take 1,200 mg by mouth 2 (two) times daily as needed for to loosen phlegm.     [provider]  losartan (COZAAR) 25 MG tablet Take 1 tablet (25 mg total) by mouth daily. 06/03/23   Bradd Canary, MD  metoprolol succinate (TOPROL-XL) 50 MG 24 hr tablet Take 1 tablet (50 mg total) by mouth daily. Take with or immediately following a meal. 06/11/23   Bradd Canary, MD  mupirocin cream (BACTROBAN) 2 % Apply 1 application topically 2 (two) times daily. Apply to the nose twice a day x 1 week 08/10/21   Bradd Canary, MD  pantoprazole (PROTONIX)  40 MG tablet Take 1 tablet (40 mg total) by mouth daily as needed. TAKE 1 TABLET TWICE A DAY BEFORE MEALS 06/11/23   Bradd Canary, MD  SYNTHROID 50 MCG tablet TAKE 1 TABLET DAILY BEFORE BREAKFAST 06/20/22   Bradd Canary, MD    Family History Family History  Problem Relation Age of Onset   Other Mother        varicose veins   Hip fracture Mother    Heart disease Father        CHF   Hyperlipidemia Father    Heart attack Father    Cancer Sister        stomach   Heart disease Sister    Heart disease Brother    Diabetes Daughter  Heart disease Daughter    Hyperlipidemia Daughter    Hypertension Daughter    Aneurysm Daughter    Colon cancer Neg Hx     Social History Social History   Tobacco Use   Smoking status: Former    Current packs/day: 0.00    Types: Cigarettes    Start date: 11/12/1948    Quit date: 11/12/1988    Years since quitting: 34.6   Smokeless tobacco: Never  Vaping Use   Vaping status: Never Used  Substance Use Topics   Alcohol use: Yes    Alcohol/week: 4.0 standard drinks of alcohol    Types: 4 Standard drinks or equivalent per week    Comment: occasional wine    Drug use: No     Allergies   Simvastatin, Crestor [rosuvastatin], Lipitor [atorvastatin], Red dye #40 (allura red), and Statins   Review of Systems Review of Systems  Constitutional:  Negative for chills and fever.  Eyes:  Positive for discharge, redness and itching. Negative for photophobia, pain and visual disturbance.  Gastrointestinal:  Negative for nausea and vomiting.  Neurological:  Negative for headaches.     Physical Exam Triage Vital Signs ED Triage Vitals  Encounter Vitals Group     BP 07/09/23 1133 132/75     Systolic BP Percentile --      Diastolic BP Percentile --      Pulse Rate 07/09/23 1133 82     Resp 07/09/23 1133 18     Temp 07/09/23 1133 98 F (36.7 C)     Temp Source 07/09/23 1133 Oral     SpO2 07/09/23 1133 97 %     Weight 07/09/23 1131 150 lb (68 kg)      Height 07/09/23 1131 5\' 1"  (1.549 m)     Head Circumference --      Peak Flow --      Pain Score 07/09/23 1129 0     Pain Loc --      Pain Education --      Exclude from Growth Chart --    No data found.  Updated Vital Signs BP 132/75 (BP Location: Left Arm)   Pulse 82   Temp 98 F (36.7 C) (Oral)   Resp 18   Ht 5\' 1"  (1.549 m)   Wt 150 lb (68 kg)   SpO2 97%   BMI 28.34 kg/m   Visual Acuity Right Eye Distance:  (UTO today) Left Eye Distance:  (UTO today.) Bilateral Distance:  (UTO today)     Physical Exam Vitals and nursing note reviewed.  Constitutional:      General: She is not in acute distress.    Appearance: Normal appearance. She is not ill-appearing.  HENT:     Head: Normocephalic and atraumatic.  Eyes:     Extraocular Movements: Extraocular movements intact.     Pupils: Pupils are equal, round, and reactive to light.     Comments: Mild conjunctival injection bilaterally with small amount of crusting to inner and outer corners of lids bilaterally  Cardiovascular:     Rate and Rhythm: Normal rate.  Pulmonary:     Effort: Pulmonary effort is normal.  Neurological:     Mental Status: She is alert.  Psychiatric:        Mood and Affect: Mood normal.        Behavior: Behavior normal.        Thought Content: Thought content normal.      UC Treatments / Results  Labs (  all labs ordered are listed, but only abnormal results are displayed) Labs Reviewed - No data to display  EKG   Radiology No results found.  Procedures Procedures (including critical care time)  Medications Ordered in UC Medications - No data to display  Initial Impression / Assessment and Plan / UC Course  I have reviewed the triage vital signs and the nursing notes.  Pertinent labs & imaging results that were available during my care of the patient were reviewed by me and considered in my medical decision making (see chart for details).    Will treat to cover  conjunctivitis with polytrim drops and encouraged follow up if no gradual improvement or with any further concerns.   Final Clinical Impressions(s) / UC Diagnoses   Final diagnoses:  Acute bacterial conjunctivitis of both eyes   Discharge Instructions   None    ED Prescriptions     Medication Sig Dispense Auth. Provider   trimethoprim-polymyxin b (POLYTRIM) ophthalmic solution Place 1 drop into both eyes every 4 (four) hours for 7 days. 10 mL Tomi Bamberger, PA-C      PDMP not reviewed this encounter.   Tomi Bamberger, PA-C 07/09/23 1213

## 2023-07-09 NOTE — ED Triage Notes (Signed)
"  I have matter in my eyes but this morning my left and sometimes both are itching a lot". "After trying to pull this eye drainage out it seems to really itch more and it hurts".

## 2023-07-16 ENCOUNTER — Other Ambulatory Visit (INDEPENDENT_AMBULATORY_CARE_PROVIDER_SITE_OTHER): Payer: Medicare Other

## 2023-07-16 DIAGNOSIS — I5022 Chronic systolic (congestive) heart failure: Secondary | ICD-10-CM

## 2023-07-17 LAB — PARATHYROID HORMONE, INTACT (NO CA): PTH: 86 pg/mL — ABNORMAL HIGH (ref 16–77)

## 2023-07-17 LAB — COMPREHENSIVE METABOLIC PANEL
ALT: 6 U/L (ref 0–35)
AST: 21 U/L (ref 0–37)
Albumin: 3.9 g/dL (ref 3.5–5.2)
Alkaline Phosphatase: 85 U/L (ref 39–117)
BUN: 22 mg/dL (ref 6–23)
CO2: 31 meq/L (ref 19–32)
Calcium: 10.9 mg/dL — ABNORMAL HIGH (ref 8.4–10.5)
Chloride: 103 meq/L (ref 96–112)
Creatinine, Ser: 1.31 mg/dL — ABNORMAL HIGH (ref 0.40–1.20)
GFR: 36.13 mL/min — ABNORMAL LOW (ref >60.00)
Glucose, Bld: 134 mg/dL — ABNORMAL HIGH (ref 70–99)
Potassium: 4.9 meq/L (ref 3.5–5.1)
Sodium: 144 meq/L (ref 135–145)
Total Bilirubin: 0.7 mg/dL (ref 0.2–1.2)
Total Protein: 6.9 g/dL (ref 6.0–8.3)

## 2023-07-22 ENCOUNTER — Ambulatory Visit: Payer: Medicare Other | Attending: Physician Assistant | Admitting: Physician Assistant

## 2023-07-22 VITALS — BP 116/78 | HR 68 | Ht 61.0 in | Wt 148.2 lb

## 2023-07-22 DIAGNOSIS — R002 Palpitations: Secondary | ICD-10-CM | POA: Insufficient documentation

## 2023-07-22 NOTE — Patient Instructions (Addendum)
Medication Instructions:  NO CHANGES *If you need a refill on your cardiac medications before your next appointment, please call your pharmacy*   Lab Work: NO LABS If you have labs (blood work) drawn today and your tests are completely normal, you will receive your results only by: MyChart Message (if you have MyChart) OR A paper copy in the mail If you have any lab test that is abnormal or we need to change your treatment, we will call you to review the results.   Testing/Procedures: NO TESTING   Follow-Up: At [facility], you and your health needs are our priority.  As part of our continuing mission to provide you with exceptional heart care, we have created designated Provider Care Teams.  These Care Teams include your primary Cardiologist (physician) and Advanced Practice Providers (APPs -  Physician Assistants and Nurse Practitioners) who all work together to provide you with the care you need, when you need it.  We recommend signing up for the patient portal called "MyChart".  Sign up information is provided on this After Visit Summary.  MyChart is used to connect with patients for Virtual Visits (Telemedicine).  Patients are able to view lab/test results, encounter notes, upcoming appointments, etc.  Non-urgent messages can be sent to your provider as well.   To learn more about what you can do with MyChart, go to [email or web address].    Your next appointment:   6 month(s)  Provider:   Peter Swaziland, MD  Other Instructions RECOMMEND KARDIA MOBILE TO MONITOR HEART RHYTHM AT HOME.  KardiaMobile [email or web address]        FDA-cleared, clinical grade mobile EKG monitor: Christina Mckinney is the most clinically-validated mobile EKG used by the world's leading cardiac care medical professionals With Basic service, know instantly if your heart rhythm is normal or if atrial fibrillation is detected, and email the last single EKG recording to  yourself or your doctor Premium service, available for purchase through the Kardia app for $9.99 per month or $99 per year, includes unlimited history and storage of your EKG recordings, a monthly EKG summary report to share with your doctor, along with the ability to track your blood pressure, activity and weight Includes one KardiaMobile phone clip FREE SHIPPING: Standard delivery 1-3 business days. Orders placed by 11:00am PST will ship that afternoon. Otherwise, will ship next business day. All orders ship via [employer] from [place], [place]    [employer] - sending an EKG Download app and set up profile. Run EKG - by placing 1-2 fingers on the silver plates After EKG is complete - Download PDF  - Skip password (if you apply a password the provider will need it to view the EKG) Click share button (square with upward arrow) in bottom left corner To send: choose MyChart (first time log into MyChart)  Pop up window about sending ECG Click continue Choose type of message Choose provider Type subject and message Click send (EKG should be attached)  - To send additional EKGs in one message click the paperclip image and bottom of page to attach.

## 2023-07-22 NOTE — Progress Notes (Unsigned)
  Cardiology Office Note:  .   Date:  07/22/2023  ID:  Christina Mckinney, DOB 1931-12-02, MRN [id number] PCP: Bradd Canary, MD  [facility] Providers Cardiologist:  Peter Swaziland, MD { Click to update primary MD,subspecialty MD or APP then REFRESH:1}   History of Present Illness: .   Christina Mckinney is a 89 y.o. female with a hx of carotid artery stenosis, hypertension, hyperlipidemia intolerant of statins and anxiety.  He previously saw Dr. Flora Lipps and wore a heart monitor in June for palpitation.  The result was normal.  Due to dizziness and borderline low blood pressure, losartan was stopped.  He was seen by Edd Fabian, NP on 02/22/2021 for elevated blood pressure after coming off of Cozaar.  Propranolol was increased to help cover her blood pressure and palpitation, she was eventually restarted on 25 mg daily of losartan.  Myoview was ordered and performed on 07/03/2021 which showed EF 89%, no ischemia or infarction, normal wall motion.  ABI was normal in November 2022.  Dr. Flora Lipps recommended in Nov 2022 switching the propranolol to metoprolol succinate 50 mg daily.  Since the last visit, patient requested to switch provider to Dr. Swaziland.  Since switching from propranolol to metoprolol, her palpitation has improved.  Due to persistent palpitation, we repeated her heart monitor that showed very transient bursts of fast heartbeat, typically lasting 3-5 beats each.  She was not symptomatic with those.  She was last seen by Dr. Swaziland in January 2024 at which time she was doing well.  1 year follow-up was recommended.  Carotid ultrasound obtained in January showed a mild carotid artery disease bilaterally.  Presents today for follow-up.  She continues to have nightly episode of palpitation.  She also was diagnosed with bladder spasm.  She is not sure if her palpitation is coming from her bladder spasm or not.  Symptom typically occurs both in the lower abdomen and also in her chest at the same time.   I reviewed the previous heart monitor with her, I do not think her palpitation is significant to warrant another heart monitor at this time.  I did however recommend she get a Kardia mobile device to work with her iPhone.  This is for long-term monitoring.  She can follow-up with Dr. Swaziland in 6 months.  ROS: ***  Studies Reviewed: .        *** Risk Assessment/Calculations:   {Does this patient have ATRIAL FIBRILLATION?:[id number]} No BP recorded.  {Refresh Note OR Click here to enter BP  :1}***       Physical Exam:   VS:  There were no vitals taken for this visit.   Wt Readings from Last 3 Encounters:  07/09/23 150 lb (68 kg)  06/11/23 150 lb (68 kg)  11/21/22 150 lb (68 kg)    GEN: Well nourished, well developed in no acute distress NECK: No JVD; No carotid bruits CARDIAC: ***RRR, no murmurs, rubs, gallops RESPIRATORY:  Clear to auscultation without rales, wheezing or rhonchi  ABDOMEN: Soft, non-tender, non-distended EXTREMITIES:  No edema; No deformity   ASSESSMENT AND PLAN: .   ***    {Are you ordering a CV Procedure (e.g. stress test, cath, DCCV, TEE, etc)?   Press F2        :[id number]}  Dispo: ***  Signed, Azalee Course, PA

## 2023-08-15 ENCOUNTER — Other Ambulatory Visit: Payer: Self-pay | Admitting: Family Medicine

## 2023-10-02 DIAGNOSIS — Z6828 Body mass index (BMI) 28.0-28.9, adult: Secondary | ICD-10-CM | POA: Diagnosis not present

## 2023-10-02 DIAGNOSIS — Z1231 Encounter for screening mammogram for malignant neoplasm of breast: Secondary | ICD-10-CM | POA: Diagnosis not present

## 2023-10-02 DIAGNOSIS — Z01419 Encounter for gynecological examination (general) (routine) without abnormal findings: Secondary | ICD-10-CM | POA: Diagnosis not present

## 2023-10-02 DIAGNOSIS — N959 Unspecified menopausal and perimenopausal disorder: Secondary | ICD-10-CM | POA: Diagnosis not present

## 2023-10-23 DIAGNOSIS — R002 Palpitations: Secondary | ICD-10-CM | POA: Diagnosis not present

## 2023-10-23 DIAGNOSIS — N189 Chronic kidney disease, unspecified: Secondary | ICD-10-CM | POA: Diagnosis not present

## 2023-10-23 DIAGNOSIS — I2699 Other pulmonary embolism without acute cor pulmonale: Secondary | ICD-10-CM | POA: Diagnosis not present

## 2023-10-23 DIAGNOSIS — N1832 Chronic kidney disease, stage 3b: Secondary | ICD-10-CM | POA: Diagnosis not present

## 2023-10-23 DIAGNOSIS — E1122 Type 2 diabetes mellitus with diabetic chronic kidney disease: Secondary | ICD-10-CM | POA: Diagnosis not present

## 2023-10-23 DIAGNOSIS — H109 Unspecified conjunctivitis: Secondary | ICD-10-CM | POA: Diagnosis not present

## 2023-10-23 DIAGNOSIS — E039 Hypothyroidism, unspecified: Secondary | ICD-10-CM | POA: Diagnosis not present

## 2023-10-23 DIAGNOSIS — I82409 Acute embolism and thrombosis of unspecified deep veins of unspecified lower extremity: Secondary | ICD-10-CM | POA: Diagnosis not present

## 2023-10-23 LAB — LAB REPORT - SCANNED: EGFR: 46

## 2023-10-28 LAB — CBC AND DIFFERENTIAL
HCT: 37 (ref 36–46)
Hemoglobin: 12.4 (ref 12.0–16.0)
Neutrophils Absolute: 6.7
Platelets: 241 10*3/uL (ref 150–400)
WBC: 8.7

## 2023-10-28 LAB — HEPATIC FUNCTION PANEL
ALT: 5 U/L — AB (ref 7–35)
AST: 21 (ref 13–35)
Alkaline Phosphatase: 90 (ref 25–125)
Bilirubin, Total: 0.7

## 2023-10-28 LAB — BASIC METABOLIC PANEL
BUN: 20 (ref 4–21)
CO2: 26 — AB (ref 13–22)
Chloride: 105 (ref 99–108)
Creatinine: 1.2 — AB (ref 0.5–1.1)
Glucose: 115
Potassium: 3.8 meq/L (ref 3.5–5.1)
Sodium: 140 (ref 137–147)

## 2023-10-28 LAB — COMPREHENSIVE METABOLIC PANEL
Albumin: 4.3 (ref 3.5–5.0)
Calcium: 11 — AB (ref 8.7–10.7)
Globulin: 2.1
eGFR: 46

## 2023-10-28 LAB — CBC: RBC: 3.91 (ref 3.87–5.11)

## 2023-10-29 ENCOUNTER — Encounter: Payer: Self-pay | Admitting: *Deleted

## 2023-11-13 ENCOUNTER — Other Ambulatory Visit: Payer: Self-pay | Admitting: Family Medicine

## 2024-02-06 ENCOUNTER — Other Ambulatory Visit: Payer: Self-pay

## 2024-02-06 DIAGNOSIS — I10 Essential (primary) hypertension: Secondary | ICD-10-CM

## 2024-02-06 NOTE — Telephone Encounter (Unsigned)
 Copied from CRM [id number]. Topic: Clinical - Medication Refill >> Feb 06, 2024  4:49 PM Tiffany S wrote: Most Recent Primary Care Visit:  Provider: [facility]  Department: [facility]  Visit Type: LAB  Date: 07/16/2023  Medication: SYNTHROID 50 MCG tablet [[id number]] losartan (COZAAR) 25 MG tablet [[id number]]   Has the patient contacted their pharmacy? Yes (Agent: If no, request that the patient contact the pharmacy for the refill. If patient does not wish to contact the pharmacy document the reason why and proceed with request.) (Agent: If yes, when and what did the pharmacy advise?)  Is this the correct pharmacy for this prescription? Yes If no, delete pharmacy and type the correct one.  This is the patient's preferred pharmacy:  [facility] - [place], [place] - [address] [place] [address] Phone: [phone] Fax: [phone]  [facility] - [place], [place] - [address] [place] [address] Phone: [phone] Fax: [phone]   Has the prescription been filled recently? Yes  Is the patient out of the medication? Yes  Has the patient been seen for an appointment in the last year OR does the patient have an upcoming appointment? Yes  Can we respond through MyChart? No  Agent: Please be advised that Rx refills may take up to 3 business days. We ask that you follow-up with your pharmacy.

## 2024-02-07 ENCOUNTER — Telehealth: Payer: Self-pay | Admitting: Family Medicine

## 2024-02-07 MED ORDER — LOSARTAN POTASSIUM 25 MG PO TABS: 25.0000 mg | ORAL_TABLET | Freq: Every day | ORAL | 0 refills | Status: DC

## 2024-02-07 MED ORDER — SYNTHROID 50 MCG PO TABS: 50.0000 ug | ORAL_TABLET | Freq: Every day | ORAL | 0 refills | Status: DC

## 2024-02-07 NOTE — Telephone Encounter (Signed)
 Copied from CRM [id number]. Topic: Appointments - Scheduling Inquiry for Clinic >> Feb 06, 2024  5:00 PM Tiffany S wrote: Reason for CRM: Patient is trying to get schedule with Dr Abner Greenspan for bladder spasms. Dr Abner Greenspan next available is in October Patient wanted to see if she could get an appt earlier

## 2024-02-07 NOTE — Telephone Encounter (Signed)
 Called patient's son, Onalee Hua. He was headed to his mother's house to let her know an appointment has been made for Monday March 31st at 11:20 am

## 2024-02-07 NOTE — Telephone Encounter (Signed)
 I called pt to offer an NP but she did not pick up and mailbox was full. Will you try to reach out to her later?

## 2024-02-09 NOTE — Assessment & Plan Note (Signed)
 Encouraged good sleep hygiene such as dark, quiet room. No blue/green glowing lights such as computer screens in bedroom. No alcohol or stimulants in evening. Cut down on caffeine as able. Regular exercise is helpful but not just prior to bed time.

## 2024-02-09 NOTE — Assessment & Plan Note (Addendum)
 Can use alprazolam prn, she agrees to report if worsens

## 2024-02-09 NOTE — Assessment & Plan Note (Signed)
 Hydrate and monitor

## 2024-02-09 NOTE — Assessment & Plan Note (Signed)
 Encourage heart healthy diet such as MIND or DASH diet, increase exercise, avoid trans fats, simple carbohydrates and processed foods, consider a krill or fish or flaxseed oil cap daily.

## 2024-02-09 NOTE — Assessment & Plan Note (Signed)
Continue to monitor, asymptomatic 

## 2024-02-09 NOTE — Assessment & Plan Note (Signed)
No recent exacerbation. No changes to therapy today

## 2024-02-09 NOTE — Assessment & Plan Note (Addendum)
 Hydrate and monitor

## 2024-02-09 NOTE — Assessment & Plan Note (Signed)
 Well controlled, no changes to meds. Encouraged heart healthy diet such as the DASH diet and exercise as tolerated.

## 2024-02-09 NOTE — Assessment & Plan Note (Signed)
 On Levothyroxine, continue to monitor

## 2024-02-09 NOTE — Assessment & Plan Note (Signed)
 Does not tolerate statins.

## 2024-02-10 ENCOUNTER — Encounter: Payer: Self-pay | Admitting: Family Medicine

## 2024-02-10 ENCOUNTER — Ambulatory Visit (INDEPENDENT_AMBULATORY_CARE_PROVIDER_SITE_OTHER): Admitting: Family Medicine

## 2024-02-10 ENCOUNTER — Other Ambulatory Visit: Payer: Self-pay | Admitting: Family Medicine

## 2024-02-10 VITALS — BP 158/74 | HR 89 | Temp 98.0°F | Resp 18 | Ht 61.0 in | Wt 140.2 lb

## 2024-02-10 DIAGNOSIS — F5104 Psychophysiologic insomnia: Secondary | ICD-10-CM

## 2024-02-10 DIAGNOSIS — E039 Hypothyroidism, unspecified: Secondary | ICD-10-CM

## 2024-02-10 DIAGNOSIS — G2581 Restless legs syndrome: Secondary | ICD-10-CM

## 2024-02-10 DIAGNOSIS — E785 Hyperlipidemia, unspecified: Secondary | ICD-10-CM

## 2024-02-10 DIAGNOSIS — M549 Dorsalgia, unspecified: Secondary | ICD-10-CM | POA: Diagnosis not present

## 2024-02-10 DIAGNOSIS — F419 Anxiety disorder, unspecified: Secondary | ICD-10-CM | POA: Diagnosis not present

## 2024-02-10 DIAGNOSIS — M791 Myalgia, unspecified site: Secondary | ICD-10-CM

## 2024-02-10 DIAGNOSIS — I5022 Chronic systolic (congestive) heart failure: Secondary | ICD-10-CM

## 2024-02-10 DIAGNOSIS — E119 Type 2 diabetes mellitus without complications: Secondary | ICD-10-CM | POA: Diagnosis not present

## 2024-02-10 DIAGNOSIS — M109 Gout, unspecified: Secondary | ICD-10-CM | POA: Diagnosis not present

## 2024-02-10 DIAGNOSIS — I110 Hypertensive heart disease with heart failure: Secondary | ICD-10-CM

## 2024-02-10 DIAGNOSIS — I10 Essential (primary) hypertension: Secondary | ICD-10-CM

## 2024-02-10 DIAGNOSIS — N3289 Other specified disorders of bladder: Secondary | ICD-10-CM | POA: Diagnosis not present

## 2024-02-10 DIAGNOSIS — R002 Palpitations: Secondary | ICD-10-CM

## 2024-02-10 DIAGNOSIS — N189 Chronic kidney disease, unspecified: Secondary | ICD-10-CM

## 2024-02-10 DIAGNOSIS — I6523 Occlusion and stenosis of bilateral carotid arteries: Secondary | ICD-10-CM

## 2024-02-10 DIAGNOSIS — T466X5A Adverse effect of antihyperlipidemic and antiarteriosclerotic drugs, initial encounter: Secondary | ICD-10-CM

## 2024-02-10 DIAGNOSIS — H547 Unspecified visual loss: Secondary | ICD-10-CM | POA: Diagnosis not present

## 2024-02-10 DIAGNOSIS — L578 Other skin changes due to chronic exposure to nonionizing radiation: Secondary | ICD-10-CM

## 2024-02-10 DIAGNOSIS — H10509 Unspecified blepharoconjunctivitis, unspecified eye: Secondary | ICD-10-CM

## 2024-02-10 DIAGNOSIS — E213 Hyperparathyroidism, unspecified: Secondary | ICD-10-CM | POA: Diagnosis not present

## 2024-02-10 DIAGNOSIS — I739 Peripheral vascular disease, unspecified: Secondary | ICD-10-CM

## 2024-02-10 LAB — COMPREHENSIVE METABOLIC PANEL WITH GFR
ALT: 6 U/L (ref 0–35)
AST: 22 U/L (ref 0–37)
Albumin: 4.4 g/dL (ref 3.5–5.2)
Alkaline Phosphatase: 86 U/L (ref 39–117)
BUN: 23 mg/dL (ref 6–23)
CO2: 28 meq/L (ref 19–32)
Calcium: 10.8 mg/dL — ABNORMAL HIGH (ref 8.4–10.5)
Chloride: 104 meq/L (ref 96–112)
Creatinine, Ser: 1.25 mg/dL — ABNORMAL HIGH (ref 0.40–1.20)
GFR: 38.06 mL/min — ABNORMAL LOW (ref >60.00)
Glucose, Bld: 104 mg/dL — ABNORMAL HIGH (ref 70–99)
Potassium: 5.2 meq/L — ABNORMAL HIGH (ref 3.5–5.1)
Sodium: 142 meq/L (ref 135–145)
Total Bilirubin: 0.7 mg/dL (ref 0.2–1.2)
Total Protein: 7.3 g/dL (ref 6.0–8.3)

## 2024-02-10 LAB — LIPID PANEL
Cholesterol: 210 mg/dL — ABNORMAL HIGH (ref 0–200)
HDL: 55.3 mg/dL (ref >39.00)
LDL Cholesterol: 125 mg/dL — ABNORMAL HIGH (ref 0–99)
NonHDL: 154.65
Total CHOL/HDL Ratio: 4
Triglycerides: 149 mg/dL (ref 0.0–149.0)
VLDL: 29.8 mg/dL (ref 0.0–40.0)

## 2024-02-10 LAB — CBC WITH DIFFERENTIAL/PLATELET
Basophils Absolute: 0.1 10*3/uL (ref 0.0–0.1)
Basophils Relative: 0.8 % (ref 0.0–3.0)
Eosinophils Absolute: 0.1 10*3/uL (ref 0.0–0.7)
Eosinophils Relative: 1.7 % (ref 0.0–5.0)
HCT: 40.2 % (ref 36.0–46.0)
Hemoglobin: 13.1 g/dL (ref 12.0–15.0)
Lymphocytes Relative: 23.1 % (ref 12.0–46.0)
Lymphs Abs: 1.8 10*3/uL (ref 0.7–4.0)
MCHC: 32.7 g/dL (ref 30.0–36.0)
MCV: 97.2 fl (ref 78.0–100.0)
Monocytes Absolute: 0.5 10*3/uL (ref 0.1–1.0)
Monocytes Relative: 6.8 % (ref 3.0–12.0)
Neutro Abs: 5.3 10*3/uL (ref 1.4–7.7)
Neutrophils Relative %: 67.6 % (ref 43.0–77.0)
Platelets: 253 10*3/uL (ref 150.0–400.0)
RBC: 4.13 Mil/uL (ref 3.87–5.11)
RDW: 14.1 % (ref 11.5–15.5)
WBC: 7.9 10*3/uL (ref 4.0–10.5)

## 2024-02-10 LAB — HEMOGLOBIN A1C: Hgb A1c MFr Bld: 5.8 % (ref 4.6–6.5)

## 2024-02-10 LAB — TSH: TSH: 3.62 u[IU]/mL (ref 0.35–5.50)

## 2024-02-10 LAB — URIC ACID: Uric Acid, Serum: 4.8 mg/dL (ref 2.4–7.0)

## 2024-02-10 LAB — VITAMIN D 25 HYDROXY (VIT D DEFICIENCY, FRACTURES): VITD: 58 ng/mL (ref 30.00–100.00)

## 2024-02-10 MED ORDER — ROPINIROLE HCL 0.25 MG PO TABS: 0.2500 mg | ORAL_TABLET | Freq: Every day | ORAL | 2 refills | Status: DC

## 2024-02-10 MED ORDER — METOPROLOL SUCCINATE ER 100 MG PO TB24: 100.0000 mg | ORAL_TABLET | Freq: Every day | ORAL | 1 refills | Status: DC

## 2024-02-10 MED ORDER — HYOSCYAMINE SULFATE 0.125 MG SL SUBL: 0.1250 mg | SUBLINGUAL_TABLET | SUBLINGUAL | 1 refills | Status: DC | PRN

## 2024-02-10 MED ORDER — SYNTHROID 50 MCG PO TABS: 50.0000 ug | ORAL_TABLET | Freq: Every day | ORAL | 1 refills | Status: DC

## 2024-02-10 MED ORDER — LOSARTAN POTASSIUM 25 MG PO TABS
25.0000 mg | ORAL_TABLET | Freq: Every day | ORAL | 1 refills | Status: DC
Start: 2024-02-10 — End: 2024-07-20

## 2024-02-10 NOTE — Progress Notes (Signed)
 Subjective:    Patient ID: Christina Mckinney, female    DOB: 30-Sep-1932, 89 y.o.   MRN: [id number]  Chief Complaint  Patient presents with   Spasms    Bladder    HPI Discussed the use of AI scribe software for clinical note transcription with the patient, who gave verbal consent to proceed.  History of Present Illness Christina Mckinney is a 89 year old female who presents with multiple health concerns including leg discomfort, tremors, and bladder spasms.  She experiences significant discomfort in her legs, particularly at night, describing them as feeling hot and requiring constant movement. There is a family history of restless legs. She is aware of a medication called ropinirole that her daughter uses for similar symptoms.  She experiences tremors, particularly noticeable when holding a cup of coffee, requiring both hands to stabilize it. The tremors worsen at night when she is tired. She is currently taking metoprolol, which was previously reduced from 100 mg to 50 mg, but she recalls better control of her symptoms at the higher dose.  She describes bladder spasms that occur both day and night, disrupting her sleep. She recalls a previous urologist visit about a year ago but cannot remember the medication prescribed. She has no blood in her urine, fevers, or chills.  She mentions a history of fluttering sensations, initially thought to be cardiac-related, but was later attributed to bladder spasms by another doctor. She is dissatisfied with her current cardiologist and is seeking a new one. She reports no recent chest pain or new trouble breathing but experiences occasional shortness of breath, which she manages with an inhaler.  She reports a yellow matter over her eyes, which she has discussed with an eye doctor who has since retired. She is in need of a new eye examination as it has been nearly a year since her last visit.  She experiences constipation, describing her stools as hard  despite taking Metamucil and Colace. She drinks plenty of water and has considered using MiraLAX.  She coughs up phlegm about five to six times a day, which is thick and sometimes clotted. She has been using Flonase as recommended by an ENT specialist but not routinely  She takes several medications including Synthroid, Cozaar, and furosemide, and has a history of adjusting her furosemide dose between 20 mg and 40 mg on alternate days.    Past Medical History:  Diagnosis Date   Anemia, unspecified    Anxiety and depression 09/18/2015   Anxiety state, unspecified    Chest pain    Chronic systolic CHF (congestive heart failure) (HCC) 08/12/2015   Depression with anxiety 06/02/2007   Qualifier: Diagnosis of  By: Nena Jordan    Depressive disorder, not elsewhere classified    Diverticulitis    DVT (deep venous thrombosis) (HCC)    Dyspnea 07/22/2015   Edema 06/12/2014   Essential and other specified forms of tremor    GERD (gastroesophageal reflux disease)    Gout, unspecified    Internal hemorrhoids without mention of complication    Macular degeneration 08/07/2015   Osteoarthritis    Other abnormal glucose    Other and unspecified hyperlipidemia    Personal history of colonic polyps    Personal history of peptic ulcer disease    Pulmonary embolism (HCC)    Unspecified disorder of bladder    Unspecified essential hypertension    Unspecified hypothyroidism    Varicose veins     Past Surgical History:  Procedure Laterality Date   ABDOMINAL HYSTERECTOMY     CHOLECYSTECTOMY     ENDOVENOUS ABLATION SAPHENOUS VEIN W/ LASER  09-11-2012   left greater saphenous vein   by Gretta Began MD   EYE SURGERY     KNEE ARTHROSCOPY     right    NASAL SEPTUM SURGERY     SHOULDER SURGERY     TOTAL KNEE ARTHROPLASTY  09/2013   Right    Family History  Problem Relation Age of Onset   Other Mother        varicose veins   Hip fracture Mother    Heart disease Father        CHF    Hyperlipidemia Father    Heart attack Father    Cancer Sister        stomach   Heart disease Sister    Heart disease Brother    Diabetes Daughter    Heart disease Daughter    Hyperlipidemia Daughter    Hypertension Daughter    Aneurysm Daughter    Colon cancer Neg Hx     Social History   Socioeconomic History   Marital status: Widowed    Spouse name: Not on file   Number of children: 3   Years of education: Not on file   Highest education level: Not on file  Occupational History   Occupation: Retired   Tobacco Use   Smoking status: Former    Current packs/day: 0.00    Types: Cigarettes    Start date: 11/12/1948    Quit date: 11/12/1988    Years since quitting: 35.2   Smokeless tobacco: Never  Vaping Use   Vaping status: Never Used  Substance and Sexual Activity   Alcohol use: Yes    Alcohol/week: 4.0 standard drinks of alcohol    Types: 4 Standard drinks or equivalent per week    Comment: occasional wine    Drug use: No   Sexual activity: Never    Comment: lives alone, no dietary restrictions, widowed  Other Topics Concern   Not on file  Social History Narrative   Not on file   Social Drivers of Health   Financial Resource Strain: Not on file  Food Insecurity: Not on file  Transportation Needs: Not on file  Physical Activity: Not on file  Stress: Not on file  Social Connections: Not on file  Intimate Partner Violence: Not on file    Outpatient Medications Prior to Visit  Medication Sig Dispense Refill   albuterol (VENTOLIN HFA) 108 (90 Base) MCG/ACT inhaler USE 2 INHALATIONS EVERY 6 HOURS AS NEEDED FOR WHEEZING OR SHORTNESS OF BREATH 51 g 3   ALPRAZolam (XANAX) 0.5 MG tablet Take 1 tablet (0.5 mg total) by mouth at bedtime as needed for anxiety. 30 tablet 0   aspirin 81 MG tablet Take 1 tablet (81 mg total) by mouth daily. 30 tablet    co-enzyme Q-10 30 MG capsule Take 30 mg by mouth daily.     febuxostat (ULORIC) 40 MG tablet Take 1 tablet (40 mg total) by  mouth daily. 90 tablet 0   fexofenadine (ALLEGRA ALLERGY) 180 MG tablet Take 1 tablet (180 mg total) by mouth daily. 90 tablet 1   furosemide (LASIX) 40 MG tablet Take 1 tablet (40 mg total) by mouth daily. 90 tablet 3   guaiFENesin (MUCINEX) 600 MG 12 hr tablet Take 1,200 mg by mouth 2 (two) times daily as needed for to loosen phlegm.  pantoprazole (PROTONIX) 40 MG tablet Take 1 tablet (40 mg total) by mouth daily as needed. TAKE 1 TABLET TWICE A DAY BEFORE MEALS     losartan (COZAAR) 25 MG tablet Take 1 tablet (25 mg total) by mouth daily. 90 tablet 0   metoprolol succinate (TOPROL-XL) 50 MG 24 hr tablet Take 1 tablet (50 mg total) by mouth daily. Take with or immediately following a meal. 90 tablet 1   SYNTHROID 50 MCG tablet Take 1 tablet (50 mcg total) by mouth daily before breakfast. 90 tablet 0   mupirocin cream (BACTROBAN) 2 % Apply 1 application topically 2 (two) times daily. Apply to the nose twice a day x 1 week (Patient not taking: Reported on 07/22/2023) 15 g 1   No facility-administered medications prior to visit.    Allergies  Allergen Reactions   Simvastatin Other (See Comments)    Hip pain   Crestor [Rosuvastatin]     Left hip pain   Lipitor [Atorvastatin]     Pain in hips   Red Dye #40 (Allura Red)     Hip pain   Statins Other (See Comments)    Muscle aches, could not walk  Muscle aches, could not walk, Muscle aches, could not walk    Review of Systems  Constitutional:  Positive for malaise/fatigue. Negative for fever.  HENT:  Positive for congestion.   Eyes:  Negative for blurred vision.  Respiratory:  Negative for cough, sputum production and shortness of breath.   Cardiovascular:  Negative for chest pain, palpitations and leg swelling.  Gastrointestinal:  Negative for abdominal pain, blood in stool and nausea.  Genitourinary:  Negative for dysuria and frequency.  Musculoskeletal:  Negative for falls.  Skin:  Negative for rash.  Neurological:  Positive for  tremors. Negative for dizziness, loss of consciousness and headaches.  Endo/Heme/Allergies:  Negative for environmental allergies.  Psychiatric/Behavioral:  Negative for depression. The patient is not nervous/anxious.        Objective:    Physical Exam Constitutional:      General: She is not in acute distress.    Appearance: Normal appearance. She is well-developed. She is not toxic-appearing.  HENT:     Head: Normocephalic and atraumatic.     Right Ear: External ear normal.     Left Ear: External ear normal.     Nose: Nose normal.  Eyes:     General:        Right eye: No discharge.        Left eye: No discharge.     Conjunctiva/sclera: Conjunctivae normal.  Neck:     Thyroid: No thyromegaly.  Cardiovascular:     Rate and Rhythm: Normal rate and regular rhythm.     Heart sounds: Normal heart sounds. No murmur heard. Pulmonary:     Effort: Pulmonary effort is normal. No respiratory distress.     Breath sounds: Normal breath sounds.  Abdominal:     General: Bowel sounds are normal.     Palpations: Abdomen is soft.     Tenderness: There is no abdominal tenderness. There is no guarding.  Musculoskeletal:        General: Normal range of motion.     Cervical back: Neck supple.  Lymphadenopathy:     Cervical: No cervical adenopathy.  Skin:    General: Skin is warm and dry.  Neurological:     Mental Status: She is alert and oriented to person, place, and time.  Psychiatric:  Mood and Affect: Mood normal.        Behavior: Behavior normal.        Thought Content: Thought content normal.        Judgment: Judgment normal.     BP (!) 158/74 (BP Location: Right Arm, Patient Position: Sitting, Cuff Size: Normal)   Pulse 89   Temp 98 F (36.7 C) (Oral)   Resp 18   Ht 5\' 1"  (1.549 m)   Wt 140 lb 3.2 oz (63.6 kg)   SpO2 98%   BMI 26.49 kg/m  Wt Readings from Last 3 Encounters:  02/10/24 140 lb 3.2 oz (63.6 kg)  07/22/23 148 lb 3.2 oz (67.2 kg)  07/09/23 150 lb (68  kg)    Diabetic Foot Exam - Simple   No data filed    Lab Results  Component Value Date   WBC 8.7 10/28/2023   HGB 12.4 10/28/2023   HCT 37 10/28/2023   PLT 241 10/28/2023   GLUCOSE 134 (H) 07/16/2023   CHOL 202 (H) 06/11/2023   TRIG 183.0 (H) 06/11/2023   HDL 59.20 06/11/2023   LDLDIRECT 104.0 12/18/2021   LDLCALC 106 (H) 06/11/2023   ALT 5 (A) 10/28/2023   AST 21 10/28/2023   NA 140 10/28/2023   K 3.8 10/28/2023   CL 105 10/28/2023   CREATININE 1.2 (A) 10/28/2023   BUN 20 10/28/2023   CO2 26 (A) 10/28/2023   TSH 4.71 06/11/2023   INR 1.0 07/21/2010   HGBA1C 6.0 06/11/2023   MICROALBUR <0.7 06/11/2023    Lab Results  Component Value Date   TSH 4.71 06/11/2023   Lab Results  Component Value Date   WBC 8.7 10/28/2023   HGB 12.4 10/28/2023   HCT 37 10/28/2023   MCV 94.6 06/11/2023   PLT 241 10/28/2023   Lab Results  Component Value Date   NA 140 10/28/2023   K 3.8 10/28/2023   CO2 26 (A) 10/28/2023   GLUCOSE 134 (H) 07/16/2023   BUN 20 10/28/2023   CREATININE 1.2 (A) 10/28/2023   BILITOT 0.7 07/16/2023   ALKPHOS 90 10/28/2023   AST 21 10/28/2023   ALT 5 (A) 10/28/2023   PROT 6.9 07/16/2023   ALBUMIN 4.3 10/28/2023   CALCIUM 11.0 (A) 10/28/2023   ANIONGAP 9 12/21/2018   EGFR 46 10/28/2023   GFR 36.13 (L) 07/16/2023   Lab Results  Component Value Date   CHOL 202 (H) 06/11/2023   Lab Results  Component Value Date   HDL 59.20 06/11/2023   Lab Results  Component Value Date   LDLCALC 106 (H) 06/11/2023   Lab Results  Component Value Date   TRIG 183.0 (H) 06/11/2023   Lab Results  Component Value Date   CHOLHDL 3 06/11/2023   Lab Results  Component Value Date   HGBA1C 6.0 06/11/2023       Assessment & Plan:  Anxiety and depression Assessment & Plan: Can use alprazolam prn, she agrees to report if worsens   Chronic insomnia Assessment & Plan: Encouraged good sleep hygiene such as dark, quiet room. No blue/green glowing lights  such as computer screens in bedroom. No alcohol or stimulants in evening. Cut down on caffeine as able. Regular exercise is helpful but not just prior to bed time.     Chronic systolic CHF (congestive heart failure) (HCC) Assessment & Plan: No recent exacerbation. No changes to therapy today   Chronic renal impairment, unspecified CKD stage Assessment & Plan: Hydrate and monitor  Orders: -     Comprehensive metabolic panel with GFR  Gout, unspecified cause, unspecified chronicity, unspecified site Assessment & Plan: Hydrate and monitor   Orders: -     Uric acid  Hyperlipidemia, unspecified hyperlipidemia type Assessment & Plan: Encourage heart healthy diet such as MIND or DASH diet, increase exercise, avoid trans fats, simple carbohydrates and processed foods, consider a krill or fish or flaxseed oil cap daily.   Orders: -     Lipid panel  Hyperparathyroidism (HCC) Assessment & Plan: Continue to monitor, asymptomatic  Orders: -     Parathyroid hormone, intact (no Ca) -     VITAMIN D 25 Hydroxy (Vit-D Deficiency, Fractures)  Hypothyroidism, unspecified type Assessment & Plan: On Levothyroxine, continue to monitor   Orders: -     TSH  Primary hypertension Assessment & Plan: Well controlled, no changes to meds. Encouraged heart healthy diet such as the DASH diet and exercise as tolerated.    Orders: -     Losartan Potassium; Take 1 tablet (25 mg total) by mouth daily.  Dispense: 90 tablet; Refill: 1  Myalgia due to statin Assessment & Plan: Does not tolerate statins   Bladder spasms -     CBC with Differential/Platelet -     Urinalysis -     Urine Culture  Restless legs syndrome (RLS)  Blepharoconjunctivitis, unspecified blepharoconjunctivitis type, unspecified laterality -     Ambulatory referral to Ophthalmology  Decreased visual acuity -     Ambulatory referral to Ophthalmology  Hypercalcemia -     Comprehensive metabolic panel with GFR -      Parathyroid hormone, intact (no Ca) -     VITAMIN D 25 Hydroxy (Vit-D Deficiency, Fractures)  Back pain, unspecified back location, unspecified back pain laterality, unspecified chronicity -     CBC with Differential/Platelet -     Urinalysis -     Urine Culture  Diabetes mellitus without complication (HCC) -     Hemoglobin A1c  Sun-damaged skin -     Ambulatory referral to Dermatology  Other orders -     rOPINIRole HCl; Take 1-2 tablets (0.25-0.5 mg total) by mouth at bedtime.  Dispense: 60 tablet; Refill: 2 -     Hyoscyamine Sulfate; Place 1 tablet (0.125 mg total) under the tongue every 4 (four) hours as needed for cramping (bladder spasm).  Dispense: 30 tablet; Refill: 1 -     Metoprolol Succinate ER; Take 1 tablet (100 mg total) by mouth daily. Take with or immediately following a meal.  Dispense: 90 tablet; Refill: 1 -     Synthroid; Take 1 tablet (50 mcg total) by mouth daily before breakfast.  Dispense: 90 tablet; Refill: 1    Assessment and Plan Assessment & Plan Restless Legs Syndrome Reports burning and hot sensations in legs at night, causing significant discomfort and sleep disturbances. Symptoms are consistent with restless legs syndrome. Ropinirole is considered effective with some side effects, but benefits outweigh risks for symptom relief. - Prescribe ropinirole 0.25 mg. Instruct to take one tablet initially and increase to two tablets if symptoms persist.  Essential Tremor Reports shaking, particularly when tired, worse at night. Symptoms are consistent with essential tremor, possibly exacerbated by stress and hypoglycemia. Increasing metoprolol is a reasonable approach to manage tremor and hypertension. - Increase metoprolol to 100 mg daily to help manage tremor and hypertension.  Hypertension Blood pressure is elevated. Current management includes metoprolol and losartan. Increasing metoprolol is a reasonable approach to manage both  hypertension and tremor. -  Increase metoprolol to 100 mg daily. - Refill losartan prescription.  Spasmodic Bladder Experiences bladder spasms causing nocturnal awakenings, exacerbated by concentrated urine. Hyoscyamine is effective for immediate relief during spasms. - Prescribe hyoscyamine to be used sublingually during spasms. - Advise to drink 10 ounces of water every 1-2 hours while awake to keep urine dilute.  Ocular Discharge Reports yellow discharge from eyes, previously itchy, now causing a film over vision. Symptoms suggest possible blocked tear duct or other ocular issue. Referral to ophthalmologist is necessary for further evaluation. - Refer to ophthalmologist for further evaluation and management.  Chronic Kidney Disease Kidneys showing signs of aging. Continued follow-up is advised to monitor kidney function. - Encourage follow-up with nephrologist for ongoing management of kidney function.  Hypothyroidism Requires Synthroid for management. Blood work needed to ensure appropriate dosing. Monitoring thyroid function is crucial to manage symptoms such as tremor. - Order blood work to assess thyroid function. - Refill Synthroid prescription.  Chronic Pain Reports back pain, possibly related to kidney function or other chronic conditions. Monitoring is necessary to assess the cause and manage symptoms. - Monitor pain symptoms and kidney function.  Chronic Obstructive Pulmonary Disease (COPD) Reports coughing up phlegm multiple times a day. Lungs are clear on examination. Symptoms may be related to COPD. Increased water intake is advised to help thin mucus. - Encourage increased water intake to help thin mucus. - Monitor for any signs of infection such as fever or chills.  Constipation Experiences hard stools and uses Metamucil and Colace. Symptoms may be related to medication use. Consideration of additional treatment is advised if symptoms persist. - Consider adding MiraLAX to regimen if constipation  persists.  Dermatological Concerns Reports age spots and skin peeling, consistent with seborrheic keratosis and aging skin. Referral to dermatologist is advised for evaluation and management. - Refer to dermatologist for evaluation and management. - Consider over-the-counter retinoid cream for skin management.  General Health Maintenance Discussion on the importance of maintaining hydration, managing stress, and the impact of aging on health. Encouragement to continue activities at a comfortable pace. Emphasis on the importance of preventative care and lifestyle choices in aging. - Encourage regular hydration and balanced diet. - Advise on pacing activities and allowing for rest as needed.  Follow-up Requires follow-up for multiple conditions and medication management. Coordination of care with specialists is necessary for comprehensive management. - Order blood work and urine analysis. - Schedule follow-up appointment in the summer. - Arrange for blood pressure check in one month. - Refer to cardiologist for new evaluation. - Refer to ophthalmologist for eye concerns. - Refer to dermatologist for skin concerns.     Danise Edge, MD

## 2024-02-10 NOTE — Patient Instructions (Signed)
 Essential Tremor A tremor is trembling or shaking that a person cannot control. Most tremors affect the hands or arms. Tremors can also affect the head, vocal cords, legs, and other parts of the body. Essential tremor is a tremor without a known cause. Usually, it occurs while a person is trying to perform an action. It tends to get worse gradually as a person ages. What are the causes? The cause of this condition is not known, but it often runs in families. What increases the risk? You are more likely to develop this condition if: You have a family member with essential tremor. You are 88 years of age or older. What are the signs or symptoms? The main sign of a tremor is a rhythmic shaking of certain parts of your body that is uncontrolled and unintentional. You may: Have difficulty eating with a spoon or fork. Have difficulty writing. Nod your head up and down or side to side. Have a quivering voice. The shaking may: Get worse over time. Come and go. Be more noticeable on one side of your body. Get worse due to stress, tiredness (fatigue), caffeine, and extreme heat or cold. How is this diagnosed? This condition may be diagnosed based on: Your symptoms and medical history. A physical exam. There is no single test to diagnose an essential tremor. However, your health care provider may order tests to rule out other causes of your condition. These may include: Blood and urine tests. Imaging studies of your brain, such as a CT scan or MRI. How is this treated? Treatment for essential tremor depends on the severity of the condition. Mild tremors may not need treatment if they do not affect your day-to-day life. Severe tremors may need to be treated using one or more of the following options: Medicines. Injections of a substance called botulinum toxin. Procedures such as deep brain stimulation (DBS) implantation or MRI-guided ultrasound treatment. Lifestyle changes. Occupational or  physical therapy. Follow these instructions at home: Lifestyle  Do not use any products that contain nicotine or tobacco. These products include cigarettes, chewing tobacco, and vaping devices, such as e-cigarettes. If you need help quitting, ask your health care provider. Limit your caffeine intake as told by your health care provider. Try to get 8 hours of sleep each night. Find ways to manage your stress that fit your lifestyle and personality. Consider trying meditation or yoga. Try to anticipate stressful situations and allow extra time to manage them. If you are struggling emotionally with the effects of your tremor, consider working with a mental health provider. General instructions Take over-the-counter and prescription medicines only as told by your health care provider. Avoid extreme heat and extreme cold. Keep all follow-up visits. This is important. Visits may include physical therapy visits. Where to find more information [employer] [facility] [employer] [facility] Stroke: [employer] Contact a health care provider if: You experience any changes in the location or intensity of your tremors. You start having a tremor after starting a new medicine. You have a tremor with other symptoms, such as: Numbness. Tingling. Pain. Weakness. Your tremor gets worse. Your tremor interferes with your daily life. You feel down, blue, or sad for at least 2 weeks in a row. Worrying about your tremor and what other people think about you interferes with your everyday life functions, including relationships, work, or school. Summary Essential tremor is a tremor without a known cause. Usually, it occurs when you are trying to perform an action. You are more likely  to develop this condition if you have a family member with essential tremor. The main sign of a tremor is a rhythmic shaking of certain parts of your body that is uncontrolled and unintentional. Treatment for essential  tremor depends on the severity of the condition. This information is not intended to replace advice given to you by your health care provider. Make sure you discuss any questions you have with your health care provider. Document Revised: 08/18/2021 Document Reviewed: 08/18/2021 Elsevier Patient Education  2024 [employer].

## 2024-02-11 LAB — URINALYSIS, ROUTINE W REFLEX MICROSCOPIC
Bilirubin Urine: NEGATIVE
Hgb urine dipstick: NEGATIVE
Ketones, ur: NEGATIVE
Nitrite: NEGATIVE
Specific Gravity, Urine: 1.015 (ref 1.000–1.030)
Total Protein, Urine: NEGATIVE
Urine Glucose: NEGATIVE
Urobilinogen, UA: 0.2 (ref 0.0–1.0)
pH: 6 (ref 5.0–8.0)

## 2024-02-11 LAB — PARATHYROID HORMONE, INTACT (NO CA): PTH: 70 pg/mL (ref 16–77)

## 2024-02-12 ENCOUNTER — Other Ambulatory Visit: Payer: Self-pay | Admitting: Emergency Medicine

## 2024-02-12 ENCOUNTER — Telehealth: Payer: Self-pay | Admitting: Emergency Medicine

## 2024-02-12 DIAGNOSIS — N189 Chronic kidney disease, unspecified: Secondary | ICD-10-CM

## 2024-02-12 LAB — URINE CULTURE
MICRO NUMBER:: 16267723
SPECIMEN QUALITY:: ADEQUATE

## 2024-02-12 NOTE — Telephone Encounter (Signed)
 Copied from CRM [id number]. Topic: Clinical - Lab/Test Results >> Feb 12, 2024 11:42 AM Almira Coaster wrote: Reason for CRM: Patient returning a call she received from the office, relayed Dr.Blyth's message and patient understood, also scheduled 2 week lab recheck.

## 2024-02-12 NOTE — Telephone Encounter (Signed)
 Noted.

## 2024-02-13 ENCOUNTER — Other Ambulatory Visit: Payer: Self-pay | Admitting: Family Medicine

## 2024-02-13 ENCOUNTER — Other Ambulatory Visit: Payer: Self-pay | Admitting: *Deleted

## 2024-02-13 MED ORDER — CEFDINIR 300 MG PO CAPS: 300.0000 mg | ORAL_CAPSULE | Freq: Two times a day (BID) | ORAL | 0 refills | Status: AC

## 2024-02-24 ENCOUNTER — Other Ambulatory Visit

## 2024-04-20 ENCOUNTER — Other Ambulatory Visit: Payer: Self-pay | Admitting: Family Medicine

## 2024-05-01 DIAGNOSIS — R42 Dizziness and giddiness: Secondary | ICD-10-CM | POA: Diagnosis not present

## 2024-05-01 DIAGNOSIS — H8113 Benign paroxysmal vertigo, bilateral: Secondary | ICD-10-CM | POA: Diagnosis not present

## 2024-05-01 DIAGNOSIS — I951 Orthostatic hypotension: Secondary | ICD-10-CM | POA: Diagnosis not present

## 2024-05-05 DIAGNOSIS — I951 Orthostatic hypotension: Secondary | ICD-10-CM | POA: Diagnosis not present

## 2024-05-05 DIAGNOSIS — R42 Dizziness and giddiness: Secondary | ICD-10-CM | POA: Diagnosis not present

## 2024-05-05 DIAGNOSIS — H8113 Benign paroxysmal vertigo, bilateral: Secondary | ICD-10-CM | POA: Diagnosis not present

## 2024-05-07 DIAGNOSIS — H8113 Benign paroxysmal vertigo, bilateral: Secondary | ICD-10-CM | POA: Diagnosis not present

## 2024-05-07 DIAGNOSIS — R42 Dizziness and giddiness: Secondary | ICD-10-CM | POA: Diagnosis not present

## 2024-05-07 DIAGNOSIS — I951 Orthostatic hypotension: Secondary | ICD-10-CM | POA: Diagnosis not present

## 2024-05-11 DIAGNOSIS — I951 Orthostatic hypotension: Secondary | ICD-10-CM | POA: Diagnosis not present

## 2024-05-11 DIAGNOSIS — H8113 Benign paroxysmal vertigo, bilateral: Secondary | ICD-10-CM | POA: Diagnosis not present

## 2024-05-11 DIAGNOSIS — R42 Dizziness and giddiness: Secondary | ICD-10-CM | POA: Diagnosis not present

## 2024-05-13 DIAGNOSIS — I951 Orthostatic hypotension: Secondary | ICD-10-CM | POA: Diagnosis not present

## 2024-05-13 DIAGNOSIS — R42 Dizziness and giddiness: Secondary | ICD-10-CM | POA: Diagnosis not present

## 2024-05-13 DIAGNOSIS — H8113 Benign paroxysmal vertigo, bilateral: Secondary | ICD-10-CM | POA: Diagnosis not present

## 2024-05-18 DIAGNOSIS — I951 Orthostatic hypotension: Secondary | ICD-10-CM | POA: Diagnosis not present

## 2024-05-18 DIAGNOSIS — R42 Dizziness and giddiness: Secondary | ICD-10-CM | POA: Diagnosis not present

## 2024-05-18 DIAGNOSIS — H8113 Benign paroxysmal vertigo, bilateral: Secondary | ICD-10-CM | POA: Diagnosis not present

## 2024-05-20 DIAGNOSIS — H8113 Benign paroxysmal vertigo, bilateral: Secondary | ICD-10-CM | POA: Diagnosis not present

## 2024-05-20 DIAGNOSIS — R42 Dizziness and giddiness: Secondary | ICD-10-CM | POA: Diagnosis not present

## 2024-05-20 DIAGNOSIS — I951 Orthostatic hypotension: Secondary | ICD-10-CM | POA: Diagnosis not present

## 2024-05-28 DIAGNOSIS — H8113 Benign paroxysmal vertigo, bilateral: Secondary | ICD-10-CM | POA: Diagnosis not present

## 2024-05-28 DIAGNOSIS — R42 Dizziness and giddiness: Secondary | ICD-10-CM | POA: Diagnosis not present

## 2024-05-28 DIAGNOSIS — I951 Orthostatic hypotension: Secondary | ICD-10-CM | POA: Diagnosis not present

## 2024-06-01 DIAGNOSIS — R42 Dizziness and giddiness: Secondary | ICD-10-CM | POA: Diagnosis not present

## 2024-06-01 DIAGNOSIS — H8113 Benign paroxysmal vertigo, bilateral: Secondary | ICD-10-CM | POA: Diagnosis not present

## 2024-06-01 DIAGNOSIS — I951 Orthostatic hypotension: Secondary | ICD-10-CM | POA: Diagnosis not present

## 2024-06-02 ENCOUNTER — Encounter: Payer: Self-pay | Admitting: Cardiology

## 2024-06-02 ENCOUNTER — Ambulatory Visit: Attending: Cardiology | Admitting: Cardiology

## 2024-06-02 VITALS — BP 160/68 | HR 90 | Ht 61.0 in | Wt 138.0 lb

## 2024-06-02 DIAGNOSIS — R002 Palpitations: Secondary | ICD-10-CM | POA: Insufficient documentation

## 2024-06-02 DIAGNOSIS — I10 Essential (primary) hypertension: Secondary | ICD-10-CM | POA: Diagnosis not present

## 2024-06-02 NOTE — Patient Instructions (Signed)
 Medication Instructions:  Don't take blood pressure medication together     Take one in morning and Take one in afternoon *If you need a refill on your cardiac medications before your next appointment, please call your pharmacy*  Lab Work: None ordered  Testing/Procedures: None ordered  Follow-Up: At [facility], you and your health needs are our priority.  As part of our continuing mission to provide you with exceptional heart care, our providers are all part of one team.  This team includes your primary Cardiologist (physician) and Advanced Practice Providers or APPs (Physician Assistants and Nurse Practitioners) who all work together to provide you with the care you need, when you need it.  Your next appointment: 1 year      Call in March to schedule July appointment    Provider:  Dr.Jordan   We recommend signing up for the patient portal called MyChart.  Sign up information is provided on this After Visit Summary.  MyChart is used to connect with patients for Virtual Visits (Telemedicine).  Patients are able to view lab/test results, encounter notes, upcoming appointments, etc.  Non-urgent messages can be sent to your provider as well.   To learn more about what you can do with MyChart, go to [email or web address].

## 2024-06-02 NOTE — Progress Notes (Signed)
 Cardiology Office Note:    Date:  06/02/2024   ID:  Christina Mckinney, DOB 10-21-1932, MRN [id number]  PCP:  Domenica Harlene LABOR, MD   [facility] Providers Cardiologist:  Marylan Glore [place], MD     Referring MD: Domenica Harlene LABOR, MD   Chief Complaint  Patient presents with   Hypertension    History of Present Illness:    Christina Mckinney is a 89 y.o. female with a hx of carotid artery stenosis, hypertension, hyperlipidemia intolerant of statins and anxiety.  She did have a cardiac catheterization at [facility]  in 2001 and this was apparently normal. She was previously seen by  Dr. Barbaraann in  2021 and wore a heart monitor in June 2022 for palpitations.  The result was normal.  Due to dizziness and borderline low blood pressure, losartan  was stopped.  He was seen by Josefa Beauvais, NP on 02/22/2021 for elevated blood pressure after coming off of Cozaar .  Propranolol  was increased to help cover her blood pressure and palpitation, she was eventually restarted on 25 mg daily of losartan .  Was seen again in  August 2022 at which time she had worsening fatigue and the shortness of breath.  Myoview  was ordered and performed on 07/03/2021 which showed EF 89%, no ischemia or infarction, normal wall motion.  ABI was normal in November 2022. She was last seen by Dr. Barbaraann on 09/25/2021 at which time she continued to complain of palpitation.  Dr. Barbaraann recommended switching the propranolol  to metoprolol  succinate 50 mg daily.    Despite switching to metoprolol , she was still having persistent palpitation, so her heart monitor was repeated.  Her monitor shows several episodes of very transient burst of fast heartbeat, typically lasting 3-5 beats each.  She does not appears to be symptomatic with those as none of the episode patient triggered events.  When she did have triggering events, the underlying rhythm has always been sinus rhythm with rare episode of PVC.   She is seen today with her daughter. Was  seen by Dr Domenica in March and metoprolol  dose was increased for BP and tremor. She notes one episode when BP dropped at home. Otherwise it has been OK. She still has tremor. Did not take BP medication last night. BP does read higher in right arm.      Past Medical History:  Diagnosis Date   Anemia, unspecified    Anxiety and depression 09/18/2015   Anxiety state, unspecified    Chest pain    Chronic systolic CHF (congestive heart failure) (HCC) 08/12/2015   Depression with anxiety 06/02/2007   Qualifier: Diagnosis of  By: Georgian ROSALEA CHARM Lamar    Depressive disorder, not elsewhere classified    Diverticulitis    DVT (deep venous thrombosis) (HCC)    Dyspnea 07/22/2015   Edema 06/12/2014   Essential and other specified forms of tremor    GERD (gastroesophageal reflux disease)    Gout, unspecified    Internal hemorrhoids without mention of complication    Macular degeneration 08/07/2015   Osteoarthritis    Other abnormal glucose    Other and unspecified hyperlipidemia    Personal history of colonic polyps    Personal history of peptic ulcer disease    Pulmonary embolism (HCC)    Unspecified disorder of bladder    Unspecified essential hypertension    Unspecified hypothyroidism    Varicose veins     Past Surgical History:  Procedure Laterality Date   ABDOMINAL HYSTERECTOMY  CHOLECYSTECTOMY     ENDOVENOUS ABLATION SAPHENOUS VEIN W/ LASER  09-11-2012   left greater saphenous vein   by Krystal Doing MD   EYE SURGERY     KNEE ARTHROSCOPY     right    NASAL SEPTUM SURGERY     SHOULDER SURGERY     TOTAL KNEE ARTHROPLASTY  09/2013   Right    Current Medications: Current Meds  Medication Sig   albuterol  (VENTOLIN  HFA) 108 (90 Base) MCG/ACT inhaler USE 2 INHALATIONS EVERY 6 HOURS AS NEEDED FOR WHEEZING OR SHORTNESS OF BREATH   febuxostat  (ULORIC ) 40 MG tablet Take 1 tablet (40 mg total) by mouth daily.   furosemide  (LASIX ) 40 MG tablet Take 1 tablet (40 mg total) by mouth daily.    metoprolol  succinate (TOPROL -XL) 100 MG 24 hr tablet Take 1 tablet (100 mg total) by mouth daily. Take with or immediately following a meal.   pantoprazole  (PROTONIX ) 40 MG tablet Take 1 tablet (40 mg total) by mouth 2 (two) times daily before a meal.   SYNTHROID  50 MCG tablet Take 1 tablet (50 mcg total) by mouth daily before breakfast.     Allergies:   Simvastatin , Crestor  [rosuvastatin ], Lipitor [atorvastatin ], Red dye #40 (allura red), and Statins   Social History   Socioeconomic History   Marital status: Widowed    Spouse name: Not on file   Number of children: 3   Years of education: Not on file   Highest education level: Not on file  Occupational History   Occupation: Retired   Tobacco Use   Smoking status: Former    Current packs/day: 0.00    Types: Cigarettes    Start date: 11/12/1948    Quit date: 11/12/1988    Years since quitting: 35.5   Smokeless tobacco: Never  Vaping Use   Vaping status: Never Used  Substance and Sexual Activity   Alcohol use: Yes    Alcohol/week: 4.0 standard drinks of alcohol    Types: 4 Standard drinks or equivalent per week    Comment: occasional wine    Drug use: No   Sexual activity: Never    Comment: lives alone, no dietary restrictions, widowed  Other Topics Concern   Not on file  Social History Narrative   Not on file   Social Drivers of Health   Financial Resource Strain: Not on file  Food Insecurity: Not on file  Transportation Needs: Not on file  Physical Activity: Not on file  Stress: Not on file  Social Connections: Not on file     Family History: The patient's family history includes Aneurysm in her daughter; Cancer in her sister; Diabetes in her daughter; Heart attack in her father; Heart disease in her brother, daughter, father, and sister; Hip fracture in her mother; Hyperlipidemia in her daughter and father; Hypertension in her daughter; Other in her mother. There is no history of Colon cancer.  ROS:   Please see the  history of present illness.     All other systems reviewed and are negative.  EKGs/Labs/Other Studies Reviewed:    The following studies were reviewed today:  Echo 05/02/17: Study Conclusions   - Left ventricle: The cavity size was normal. Wall thickness was    increased in a pattern of mild LVH. Systolic function was normal.    The estimated ejection fraction was in the range of 55% to 60%.    Wall motion was normal; there were no regional wall motion    abnormalities. Doppler  parameters are consistent with abnormal    left ventricular relaxation (grade 1 diastolic dysfunction).  - Aortic valve: There was no stenosis.  - Mitral valve: Mildly calcified annulus. There was no significant    regurgitation.  - Left atrium: The atrium was mildly dilated.  - Right ventricle: The cavity size was normal. Systolic function    was normal.  - Pulmonary arteries: PA peak pressure: 29 mm Hg (S).  - Systemic veins: IVC measured 2.2 cm with normal respirophasic    variation, suggesting RA pressure 8 mmHg.   Impressions:   - Nomral LV size with mild LV hypertrophy. EF 55-60%. Normal RV    size and systolic function. No significant valvular    abnormalities.   Myoview  07/03/2021 The study is normal. Findings are consistent with no prior ischemia and no prior myocardial infarction. The study is low risk.   No ST deviation was noted.   LV perfusion is normal. There is no evidence of inducible ischemia and infarction.   Nuclear stress EF: 89 %. Left ventricular function is normal.   No ischemia or infarction on perfusion images. Normal wall motion.     Event monitor 03/20/22: Study Highlights    Normal sinus rhythm Rare PACs and PVCs Several brief runs of SVT. typically 3-5 beats. one episode lasting 18 beats (10 seconds)     Patch Wear Time:  14 days and 0 hours (2023-04-17T12:12:54-0400 to 2023-05-01T12:12:58-0400)   Patient had a min HR of 55 bpm, max HR of 169 bpm, and avg HR of 67 bpm.  Predominant underlying rhythm was Sinus Rhythm. 16 Supraventricular Tachycardia runs occurred, the run with the fastest interval lasting 5 beats with a max rate of 169 bpm, the  longest lasting 18 beats with an avg rate of 106 bpm. Isolated SVEs were rare (<1.0%), SVE Couplets were rare (<1.0%), and SVE Triplets were rare (<1.0%). Isolated VEs were rare (<1.0%), and no VE Couplets or VE Triplets were present.   EKG:  EKG is not ordered today.    Recent Labs: 02/10/2024: ALT 6; BUN 23; Creatinine, Ser 1.25; Hemoglobin 13.1; Platelets 253.0; Potassium 5.2 No hemolysis seen; Sodium 142; TSH 3.62  Recent Lipid Panel    Component Value Date/Time   CHOL 210 (H) 02/10/2024 1216   TRIG 149.0 02/10/2024 1216   HDL 55.30 02/10/2024 1216   CHOLHDL 4 02/10/2024 1216   VLDL 29.8 02/10/2024 1216   LDLCALC 125 (H) 02/10/2024 1216   LDLCALC 116 (H) 09/13/2020 1044   LDLDIRECT 104.0 12/18/2021 1616     Risk Assessment/Calculations:           Physical Exam:    VS:  BP (!) 160/68   Pulse 90   Ht 5' 1 (1.549 m)   Wt 138 lb (62.6 kg)   SpO2 97%   BMI 26.07 kg/m     Wt Readings from Last 3 Encounters:  06/02/24 138 lb (62.6 kg)  02/10/24 140 lb 3.2 oz (63.6 kg)  07/22/23 148 lb 3.2 oz (67.2 kg)    Repeat BP 164/70 in right  arm 152/70 in left arm GEN:  Well nourished, well developed in no acute distress HEENT: Normal NECK: No JVD; No carotid bruits LYMPHATICS: No lymphadenopathy CARDIAC: RRR, no murmurs, rubs, gallops RESPIRATORY:  Clear to auscultation without rales, wheezing or rhonchi  ABDOMEN: Soft, non-tender, non-distended MUSCULOSKELETAL:  No edema; No deformity  SKIN: Warm and dry NEUROLOGIC:  Alert and oriented x 3 PSYCHIATRIC:  Normal affect   ASSESSMENT:  1. Essential hypertension   2. Palpitations      PLAN:    In order of problems listed above:  Palpitation: some brief nonsustained SVT and rare PVCs. Continue beta blocker.  Hypertension: Blood pressure is  elevated today but hasn't taken meds last night. Recommend she continue to take Toprol  XL 100 mg and losartan  25 mg but take one in the morning and the other in the evening. She should check BP in right  arm as this will be more accurate.   3.  Carotid artery disease. Mild by doppler in Jan 2024.       Follow up in one year     Medication Adjustments/Labs and Tests Ordered: Current medicines are reviewed at length with the patient today.  Concerns regarding medicines are outlined above.  No orders of the defined types were placed in this encounter.  No orders of the defined types were placed in this encounter.   There are no Patient Instructions on file for this visit.   Signed, Zadie Deemer [place], MD  06/02/2024 9:34 AM    [facility]

## 2024-06-03 DIAGNOSIS — H16223 Keratoconjunctivitis sicca, not specified as Sjogren's, bilateral: Secondary | ICD-10-CM | POA: Diagnosis not present

## 2024-06-03 DIAGNOSIS — H04123 Dry eye syndrome of bilateral lacrimal glands: Secondary | ICD-10-CM | POA: Diagnosis not present

## 2024-06-08 DIAGNOSIS — H8113 Benign paroxysmal vertigo, bilateral: Secondary | ICD-10-CM | POA: Diagnosis not present

## 2024-06-08 DIAGNOSIS — I951 Orthostatic hypotension: Secondary | ICD-10-CM | POA: Diagnosis not present

## 2024-06-08 DIAGNOSIS — R42 Dizziness and giddiness: Secondary | ICD-10-CM | POA: Diagnosis not present

## 2024-06-22 ENCOUNTER — Other Ambulatory Visit: Payer: Self-pay | Admitting: Family Medicine

## 2024-06-22 ENCOUNTER — Ambulatory Visit: Payer: Self-pay

## 2024-06-22 ENCOUNTER — Other Ambulatory Visit: Payer: Self-pay

## 2024-06-22 ENCOUNTER — Ambulatory Visit (HOSPITAL_BASED_OUTPATIENT_CLINIC_OR_DEPARTMENT_OTHER)
Admission: RE | Admit: 2024-06-22 | Discharge: 2024-06-22 | Disposition: A | Discharge status: Home / Self Care | Source: Ambulatory Visit | Attending: Family Medicine | Admitting: Family Medicine

## 2024-06-22 ENCOUNTER — Encounter: Payer: Self-pay | Admitting: Family Medicine

## 2024-06-22 ENCOUNTER — Inpatient Hospital Stay (HOSPITAL_COMMUNITY)
Admission: EM | Admit: 2024-06-22 | Discharge: 2024-06-29 | DRG: 521 | Disposition: A | Discharge status: Skilled Nursing Facility | Attending: Internal Medicine | Admitting: Internal Medicine

## 2024-06-22 ENCOUNTER — Ambulatory Visit (INDEPENDENT_AMBULATORY_CARE_PROVIDER_SITE_OTHER): Admitting: Family Medicine

## 2024-06-22 ENCOUNTER — Encounter (HOSPITAL_COMMUNITY): Payer: Self-pay

## 2024-06-22 ENCOUNTER — Ambulatory Visit: Payer: Self-pay | Admitting: Family Medicine

## 2024-06-22 VITALS — BP 120/60 | HR 81 | Temp 97.8°F | Resp 18 | Ht 61.0 in | Wt 138.2 lb

## 2024-06-22 DIAGNOSIS — M109 Gout, unspecified: Secondary | ICD-10-CM | POA: Diagnosis present

## 2024-06-22 DIAGNOSIS — D62 Acute posthemorrhagic anemia: Secondary | ICD-10-CM | POA: Diagnosis not present

## 2024-06-22 DIAGNOSIS — R5383 Other fatigue: Secondary | ICD-10-CM | POA: Diagnosis not present

## 2024-06-22 DIAGNOSIS — M25552 Pain in left hip: Secondary | ICD-10-CM

## 2024-06-22 DIAGNOSIS — I5042 Chronic combined systolic (congestive) and diastolic (congestive) heart failure: Secondary | ICD-10-CM | POA: Diagnosis present

## 2024-06-22 DIAGNOSIS — E1151 Type 2 diabetes mellitus with diabetic peripheral angiopathy without gangrene: Secondary | ICD-10-CM | POA: Diagnosis not present

## 2024-06-22 DIAGNOSIS — I110 Hypertensive heart disease with heart failure: Secondary | ICD-10-CM | POA: Diagnosis not present

## 2024-06-22 DIAGNOSIS — S72002A Fracture of unspecified part of neck of left femur, initial encounter for closed fracture: Principal | ICD-10-CM | POA: Diagnosis present

## 2024-06-22 DIAGNOSIS — Z96651 Presence of right artificial knee joint: Secondary | ICD-10-CM | POA: Diagnosis not present

## 2024-06-22 DIAGNOSIS — Z9102 Food additives allergy status: Secondary | ICD-10-CM

## 2024-06-22 DIAGNOSIS — Z90710 Acquired absence of both cervix and uterus: Secondary | ICD-10-CM

## 2024-06-22 DIAGNOSIS — J449 Chronic obstructive pulmonary disease, unspecified: Secondary | ICD-10-CM | POA: Diagnosis not present

## 2024-06-22 DIAGNOSIS — S72009A Fracture of unspecified part of neck of unspecified femur, initial encounter for closed fracture: Secondary | ICD-10-CM | POA: Diagnosis present

## 2024-06-22 DIAGNOSIS — I5032 Chronic diastolic (congestive) heart failure: Secondary | ICD-10-CM | POA: Diagnosis not present

## 2024-06-22 DIAGNOSIS — Z8249 Family history of ischemic heart disease and other diseases of the circulatory system: Secondary | ICD-10-CM

## 2024-06-22 DIAGNOSIS — I130 Hypertensive heart and chronic kidney disease with heart failure and stage 1 through stage 4 chronic kidney disease, or unspecified chronic kidney disease: Secondary | ICD-10-CM | POA: Diagnosis present

## 2024-06-22 DIAGNOSIS — Z79899 Other long term (current) drug therapy: Secondary | ICD-10-CM

## 2024-06-22 DIAGNOSIS — S72012A Unspecified intracapsular fracture of left femur, initial encounter for closed fracture: Secondary | ICD-10-CM | POA: Diagnosis not present

## 2024-06-22 DIAGNOSIS — Y92009 Unspecified place in unspecified non-institutional (private) residence as the place of occurrence of the external cause: Secondary | ICD-10-CM | POA: Diagnosis not present

## 2024-06-22 DIAGNOSIS — S72002D Fracture of unspecified part of neck of left femur, subsequent encounter for closed fracture with routine healing: Secondary | ICD-10-CM | POA: Diagnosis not present

## 2024-06-22 DIAGNOSIS — Z860100 Personal history of colon polyps, unspecified: Secondary | ICD-10-CM

## 2024-06-22 DIAGNOSIS — W19XXXA Unspecified fall, initial encounter: Secondary | ICD-10-CM

## 2024-06-22 DIAGNOSIS — W1830XA Fall on same level, unspecified, initial encounter: Secondary | ICD-10-CM | POA: Diagnosis present

## 2024-06-22 DIAGNOSIS — E785 Hyperlipidemia, unspecified: Secondary | ICD-10-CM | POA: Diagnosis not present

## 2024-06-22 DIAGNOSIS — Z96642 Presence of left artificial hip joint: Secondary | ICD-10-CM

## 2024-06-22 DIAGNOSIS — K5909 Other constipation: Secondary | ICD-10-CM | POA: Diagnosis not present

## 2024-06-22 DIAGNOSIS — D72829 Elevated white blood cell count, unspecified: Secondary | ICD-10-CM | POA: Diagnosis not present

## 2024-06-22 DIAGNOSIS — R0989 Other specified symptoms and signs involving the circulatory and respiratory systems: Secondary | ICD-10-CM | POA: Diagnosis not present

## 2024-06-22 DIAGNOSIS — K5900 Constipation, unspecified: Secondary | ICD-10-CM | POA: Diagnosis not present

## 2024-06-22 DIAGNOSIS — R8290 Unspecified abnormal findings in urine: Secondary | ICD-10-CM

## 2024-06-22 DIAGNOSIS — E1142 Type 2 diabetes mellitus with diabetic polyneuropathy: Secondary | ICD-10-CM | POA: Diagnosis not present

## 2024-06-22 DIAGNOSIS — N3289 Other specified disorders of bladder: Secondary | ICD-10-CM | POA: Diagnosis not present

## 2024-06-22 DIAGNOSIS — I10 Essential (primary) hypertension: Secondary | ICD-10-CM

## 2024-06-22 DIAGNOSIS — Z96611 Presence of right artificial shoulder joint: Secondary | ICD-10-CM | POA: Diagnosis not present

## 2024-06-22 DIAGNOSIS — E039 Hypothyroidism, unspecified: Secondary | ICD-10-CM | POA: Diagnosis not present

## 2024-06-22 DIAGNOSIS — J189 Pneumonia, unspecified organism: Secondary | ICD-10-CM | POA: Diagnosis not present

## 2024-06-22 DIAGNOSIS — I2510 Atherosclerotic heart disease of native coronary artery without angina pectoris: Secondary | ICD-10-CM | POA: Diagnosis present

## 2024-06-22 DIAGNOSIS — J440 Chronic obstructive pulmonary disease with acute lower respiratory infection: Secondary | ICD-10-CM | POA: Diagnosis not present

## 2024-06-22 DIAGNOSIS — G250 Essential tremor: Secondary | ICD-10-CM | POA: Diagnosis not present

## 2024-06-22 DIAGNOSIS — I2585 Chronic coronary microvascular dysfunction: Secondary | ICD-10-CM | POA: Diagnosis not present

## 2024-06-22 DIAGNOSIS — G2581 Restless legs syndrome: Secondary | ICD-10-CM | POA: Diagnosis present

## 2024-06-22 DIAGNOSIS — Z79890 Hormone replacement therapy (postmenopausal): Secondary | ICD-10-CM | POA: Diagnosis not present

## 2024-06-22 DIAGNOSIS — M160 Bilateral primary osteoarthritis of hip: Secondary | ICD-10-CM | POA: Diagnosis not present

## 2024-06-22 DIAGNOSIS — I4510 Unspecified right bundle-branch block: Secondary | ICD-10-CM | POA: Diagnosis not present

## 2024-06-22 DIAGNOSIS — I471 Supraventricular tachycardia, unspecified: Secondary | ICD-10-CM

## 2024-06-22 DIAGNOSIS — K219 Gastro-esophageal reflux disease without esophagitis: Secondary | ICD-10-CM | POA: Diagnosis present

## 2024-06-22 DIAGNOSIS — R0602 Shortness of breath: Secondary | ICD-10-CM | POA: Diagnosis not present

## 2024-06-22 DIAGNOSIS — Z8711 Personal history of peptic ulcer disease: Secondary | ICD-10-CM

## 2024-06-22 DIAGNOSIS — H3530 Unspecified macular degeneration: Secondary | ICD-10-CM | POA: Diagnosis present

## 2024-06-22 DIAGNOSIS — R112 Nausea with vomiting, unspecified: Secondary | ICD-10-CM | POA: Diagnosis not present

## 2024-06-22 DIAGNOSIS — R0902 Hypoxemia: Secondary | ICD-10-CM | POA: Diagnosis not present

## 2024-06-22 DIAGNOSIS — S72002P Fracture of unspecified part of neck of left femur, subsequent encounter for closed fracture with malunion: Secondary | ICD-10-CM | POA: Diagnosis not present

## 2024-06-22 DIAGNOSIS — E1122 Type 2 diabetes mellitus with diabetic chronic kidney disease: Secondary | ICD-10-CM | POA: Diagnosis present

## 2024-06-22 DIAGNOSIS — M8580 Other specified disorders of bone density and structure, unspecified site: Secondary | ICD-10-CM | POA: Diagnosis not present

## 2024-06-22 DIAGNOSIS — W19XXXD Unspecified fall, subsequent encounter: Secondary | ICD-10-CM | POA: Diagnosis not present

## 2024-06-22 DIAGNOSIS — R143 Flatulence: Secondary | ICD-10-CM | POA: Diagnosis not present

## 2024-06-22 DIAGNOSIS — Z471 Aftercare following joint replacement surgery: Secondary | ICD-10-CM | POA: Diagnosis not present

## 2024-06-22 DIAGNOSIS — Z87891 Personal history of nicotine dependence: Secondary | ICD-10-CM

## 2024-06-22 DIAGNOSIS — R918 Other nonspecific abnormal finding of lung field: Secondary | ICD-10-CM | POA: Diagnosis not present

## 2024-06-22 DIAGNOSIS — N1832 Chronic kidney disease, stage 3b: Secondary | ICD-10-CM | POA: Diagnosis not present

## 2024-06-22 DIAGNOSIS — Z888 Allergy status to other drugs, medicaments and biological substances status: Secondary | ICD-10-CM

## 2024-06-22 DIAGNOSIS — Z86711 Personal history of pulmonary embolism: Secondary | ICD-10-CM

## 2024-06-22 DIAGNOSIS — Z833 Family history of diabetes mellitus: Secondary | ICD-10-CM

## 2024-06-22 DIAGNOSIS — Z96612 Presence of left artificial shoulder joint: Secondary | ICD-10-CM | POA: Diagnosis not present

## 2024-06-22 DIAGNOSIS — Z83438 Family history of other disorder of lipoprotein metabolism and other lipidemia: Secondary | ICD-10-CM

## 2024-06-22 DIAGNOSIS — I5022 Chronic systolic (congestive) heart failure: Secondary | ICD-10-CM | POA: Diagnosis not present

## 2024-06-22 LAB — CBC WITH DIFFERENTIAL/PLATELET
Basophils Absolute: 0.1 K/uL (ref 0.0–0.1)
Basophils Relative: 0.5 % (ref 0.0–3.0)
Eosinophils Absolute: 0.2 K/uL (ref 0.0–0.7)
Eosinophils Relative: 2.1 % (ref 0.0–5.0)
HCT: 39.5 % (ref 36.0–46.0)
Hemoglobin: 12.9 g/dL (ref 12.0–15.0)
Lymphocytes Relative: 10.3 % — ABNORMAL LOW (ref 12.0–46.0)
Lymphs Abs: 1.2 K/uL (ref 0.7–4.0)
MCHC: 32.6 g/dL (ref 30.0–36.0)
MCV: 96.4 fl (ref 78.0–100.0)
Monocytes Absolute: 0.8 K/uL (ref 0.1–1.0)
Monocytes Relative: 6.7 % (ref 3.0–12.0)
Neutro Abs: 9.1 K/uL — ABNORMAL HIGH (ref 1.4–7.7)
Neutrophils Relative %: 80.4 % — ABNORMAL HIGH (ref 43.0–77.0)
Platelets: 226 K/uL (ref 150.0–400.0)
RBC: 4.09 Mil/uL (ref 3.87–5.11)
RDW: 14.3 % (ref 11.5–15.5)
WBC: 11.4 K/uL — ABNORMAL HIGH (ref 4.0–10.5)

## 2024-06-22 LAB — COMPREHENSIVE METABOLIC PANEL WITH GFR
ALT: 7 U/L (ref 0–35)
AST: 22 U/L (ref 0–37)
Albumin: 4.3 g/dL (ref 3.5–5.2)
Alkaline Phosphatase: 79 U/L (ref 39–117)
BUN: 31 mg/dL — ABNORMAL HIGH (ref 6–23)
CO2: 28 meq/L (ref 19–32)
Calcium: 10.3 mg/dL (ref 8.4–10.5)
Chloride: 99 meq/L (ref 96–112)
Creatinine, Ser: 1.15 mg/dL (ref 0.40–1.20)
GFR: 41.96 mL/min — ABNORMAL LOW (ref >60.00)
Glucose, Bld: 100 mg/dL — ABNORMAL HIGH (ref 70–99)
Potassium: 4.3 meq/L (ref 3.5–5.1)
Sodium: 139 meq/L (ref 135–145)
Total Bilirubin: 1.2 mg/dL (ref 0.2–1.2)
Total Protein: 7 g/dL (ref 6.0–8.3)

## 2024-06-22 LAB — POCT URINALYSIS DIP (MANUAL ENTRY)
Bilirubin, UA: NEGATIVE
Blood, UA: NEGATIVE
Glucose, UA: NEGATIVE mg/dL
Ketones, POC UA: NEGATIVE mg/dL
Leukocytes, UA: NEGATIVE
Nitrite, UA: NEGATIVE
Protein Ur, POC: NEGATIVE mg/dL
Spec Grav, UA: <=1.005 — AB (ref 1.010–1.025)
Urobilinogen, UA: 0.2 U/dL
pH, UA: 5 (ref 5.0–8.0)

## 2024-06-22 LAB — TSH: TSH: 3.09 u[IU]/mL (ref 0.35–5.50)

## 2024-06-22 LAB — VITAMIN B12: Vitamin B-12: 554 pg/mL (ref 211–911)

## 2024-06-22 MED ORDER — OXYCODONE-ACETAMINOPHEN 5-325 MG PO TABS
1.0000 | ORAL_TABLET | Freq: Once | ORAL | Status: AC
  Administered 2024-06-22 (×2): 1 via ORAL
  Filled 2024-06-22: qty 1

## 2024-06-22 NOTE — Progress Notes (Signed)
 Subjective:    Patient ID: Christina Mckinney, female    DOB: [birth year], 89 y.o.   MRN: [id number]  Chief Complaint  Patient presents with   Fall    Pt fell on Saturday night. Pt fell on her left hip.     HPI Patient is in today for s/p fall and odor in urine.   Discussed the use of AI scribe software for clinical note transcription with the patient, who gave verbal consent to proceed.  History of Present Illness Christina Mckinney is a 89 year old female who presents with left hip pain following a fall.  She experienced a fall on Saturday while attempting to sit in a recliner, resulting in a hard impact on her left hip and head on hardwood floors. The pain is severe and located on the bone, with persistent soreness. She has been using ice packs for relief. She experiences pain when walking and compensates by putting more weight on her right foot.  She has a history of dizziness for years, which she attributes to eye problems. An eye doctor noted 'dense all in the outer layers' of her eyes and prescribed eye drops and antibiotics, which she recently discontinued due to lack of improvement.  She reports urinary symptoms, including a foul smell and increased frequency, urinating three to four times last night. She recalls a previous kidney infection but does not specify when it occurred. No burning sensation during urination.    Past Medical History:  Diagnosis Date   Anemia, unspecified    Anxiety and depression 09/18/2015   Anxiety state, unspecified    Chest pain    Chronic systolic CHF (congestive heart failure) (HCC) 08/12/2015   Depression with anxiety 06/02/2007   Qualifier: Diagnosis of  By: Georgian ROSALEA CHARM Lamar    Depressive disorder, not elsewhere classified    Diverticulitis    DVT (deep venous thrombosis) (HCC)    Dyspnea 07/22/2015   Edema 06/12/2014   Essential and other specified forms of tremor    GERD (gastroesophageal reflux disease)     Gout, unspecified    Internal hemorrhoids without mention of complication    Macular degeneration 08/07/2015   Osteoarthritis    Other abnormal glucose    Other and unspecified hyperlipidemia    Personal history of colonic polyps    Personal history of peptic ulcer disease    Pulmonary embolism (HCC)    Unspecified disorder of bladder    Unspecified essential hypertension    Unspecified hypothyroidism    Varicose veins     Past Surgical History:  Procedure Laterality Date   ABDOMINAL HYSTERECTOMY     CHOLECYSTECTOMY     ENDOVENOUS ABLATION SAPHENOUS VEIN W/ LASER  09-11-2012   left greater saphenous vein   by Krystal Doing MD   EYE SURGERY     KNEE ARTHROSCOPY     right    NASAL SEPTUM SURGERY     SHOULDER SURGERY     TOTAL KNEE ARTHROPLASTY  09/2013   Right    Family History  Problem Relation Age of Onset   Other Mother        varicose veins   Hip fracture Mother    Heart disease Father        CHF   Hyperlipidemia Father    Heart attack Father    Cancer Sister  stomach   Heart disease Sister    Heart disease Brother    Diabetes Daughter    Heart disease Daughter    Hyperlipidemia Daughter    Hypertension Daughter    Aneurysm Daughter    Colon cancer Neg Hx     Social History   Socioeconomic History   Marital status: Widowed    Spouse name: Not on file   Number of children: 3   Years of education: Not on file   Highest education level: Not on file  Occupational History   Occupation: Retired   Tobacco Use   Smoking status: Former    Current packs/day: 0.00    Types: Cigarettes    Start date: 11/12/1948    Quit date: 11/12/1988    Years since quitting: 35.6   Smokeless tobacco: Never  Vaping Use   Vaping status: Never Used  Substance and Sexual Activity   Alcohol use: Yes    Alcohol/week: 4.0 standard drinks of alcohol    Types: 4 Standard drinks or equivalent per week    Comment: occasional wine    Drug use: No   Sexual activity: Never     Comment: lives alone, no dietary restrictions, widowed  Other Topics Concern   Not on file  Social History Narrative   Not on file   Social Drivers of Health   Financial Resource Strain: Not on file  Food Insecurity: Not on file  Transportation Needs: Not on file  Physical Activity: Not on file  Stress: Not on file  Social Connections: Not on file  Intimate Partner Violence: Not on file    Outpatient Medications Prior to Visit  Medication Sig Dispense Refill   albuterol  (VENTOLIN  HFA) 108 (90 Base) MCG/ACT inhaler USE 2 INHALATIONS EVERY 6 HOURS AS NEEDED FOR WHEEZING OR SHORTNESS OF BREATH 51 g 3   febuxostat  (ULORIC ) 40 MG tablet Take 1 tablet (40 mg total) by mouth daily. 90 tablet 1   furosemide  (LASIX ) 40 MG tablet Take 1 tablet (40 mg total) by mouth daily. 90 tablet 3   metoprolol  succinate (TOPROL -XL) 100 MG 24 hr tablet Take 1 tablet (100 mg total) by mouth daily. Take with or immediately following a meal. 90 tablet 1   pantoprazole  (PROTONIX ) 40 MG tablet Take 1 tablet (40 mg total) by mouth 2 (two) times daily before a meal. 180 tablet 0   SYNTHROID  50 MCG tablet Take 1 tablet (50 mcg total) by mouth daily before breakfast. 90 tablet 1   aspirin  81 MG tablet Take 1 tablet (81 mg total) by mouth daily. (Patient not taking: Reported on 06/22/2024) 30 tablet    losartan  (COZAAR ) 25 MG tablet Take 1 tablet (25 mg total) by mouth daily. (Patient not taking: Reported on 06/22/2024) 90 tablet 1   No facility-administered medications prior to visit.    Allergies  Allergen Reactions   Simvastatin  Other (See Comments)    Hip pain   Crestor  [Rosuvastatin ]     Left hip pain   Lipitor [Atorvastatin ]     Pain in hips   Red Dye #40 (Allura Red)     Hip pain   Statins Other (See Comments)    Muscle aches, could not walk  Muscle aches, could not walk, Muscle aches, could not walk    Review of Systems  Constitutional:  Negative for fever and malaise/fatigue.  HENT:  Negative  for congestion.   Eyes:  Negative for blurred vision.  Respiratory:  Negative for shortness of breath.  Cardiovascular:  Negative for chest pain, palpitations and leg swelling.  Gastrointestinal:  Negative for abdominal pain, blood in stool and nausea.  Genitourinary:  Positive for frequency. Negative for dysuria.  Musculoskeletal:  Positive for falls.  Skin:  Negative for rash.  Neurological:  Positive for dizziness. Negative for loss of consciousness and headaches.  Endo/Heme/Allergies:  Negative for environmental allergies.  Psychiatric/Behavioral:  Negative for depression. The patient is not nervous/anxious.        Objective:    Physical Exam Vitals and nursing note reviewed.  Constitutional:      General: She is not in acute distress.    Appearance: Normal appearance. She is well-developed.  HENT:     Head: Normocephalic and atraumatic.  Eyes:     General: No scleral icterus.       Right eye: No discharge.        Left eye: No discharge.  Cardiovascular:     Rate and Rhythm: Normal rate and regular rhythm.     Heart sounds: No murmur heard. Pulmonary:     Effort: Pulmonary effort is normal. No respiratory distress.     Breath sounds: Normal breath sounds.  Musculoskeletal:        General: Tenderness present. Normal range of motion.     Cervical back: Normal range of motion and neck supple.     Right lower leg: No edema.     Left lower leg: No edema.       Legs:     Comments: Tenderness L hip with palpation Pt with good strength L low leg. No brusing   Skin:    General: Skin is warm and dry.  Neurological:     Mental Status: She is alert and oriented to person, place, and time.  Psychiatric:        Mood and Affect: Mood normal.        Behavior: Behavior normal.        Thought Content: Thought content normal.        Judgment: Judgment normal.     BP 120/60 (BP Location: Right Arm, Patient Position: Sitting, Cuff Size: Normal)   Pulse 81   Temp 97.8 F (36.6  C) (Oral)   Resp 18   Ht 5' 1 (1.549 m)   Wt 138 lb 3.2 oz (62.7 kg)   SpO2 94%   BMI 26.11 kg/m  Wt Readings from Last 3 Encounters:  06/22/24 138 lb 3.2 oz (62.7 kg)  06/02/24 138 lb (62.6 kg)  02/10/24 140 lb 3.2 oz (63.6 kg)    Diabetic Foot Exam - Simple   No data filed    Lab Results  Component Value Date   WBC 7.9 02/10/2024   HGB 13.1 02/10/2024   HCT 40.2 02/10/2024   PLT 253.0 02/10/2024   GLUCOSE 104 (H) 02/10/2024   CHOL 210 (H) 02/10/2024   TRIG 149.0 02/10/2024   HDL 55.30 02/10/2024   LDLDIRECT 104.0 12/18/2021   LDLCALC 125 (H) 02/10/2024   ALT 6 02/10/2024   AST 22 02/10/2024   NA 142 02/10/2024   K 5.2 No hemolysis seen (H) 02/10/2024   CL 104 02/10/2024   CREATININE 1.25 (H) 02/10/2024   BUN 23 02/10/2024   CO2 28 02/10/2024   TSH 3.62 02/10/2024   INR 1.0 07/21/2010   HGBA1C 5.8 02/10/2024    Lab Results  Component Value Date   TSH 3.62 02/10/2024   Lab Results  Component Value Date   WBC 7.9 02/10/2024  HGB 13.1 02/10/2024   HCT 40.2 02/10/2024   MCV 97.2 02/10/2024   PLT 253.0 02/10/2024   Lab Results  Component Value Date   NA 142 02/10/2024   K 5.2 No hemolysis seen (H) 02/10/2024   CO2 28 02/10/2024   GLUCOSE 104 (H) 02/10/2024   BUN 23 02/10/2024   CREATININE 1.25 (H) 02/10/2024   BILITOT 0.7 02/10/2024   ALKPHOS 86 02/10/2024   AST 22 02/10/2024   ALT 6 02/10/2024   PROT 7.3 02/10/2024   ALBUMIN 4.4 02/10/2024   CALCIUM  10.8 (H) 02/10/2024   ANIONGAP 9 12/21/2018   EGFR 46 10/28/2023   GFR 38.06 (L) 02/10/2024   Lab Results  Component Value Date   CHOL 210 (H) 02/10/2024   Lab Results  Component Value Date   HDL 55.30 02/10/2024   Lab Results  Component Value Date   LDLCALC 125 (H) 02/10/2024   Lab Results  Component Value Date   TRIG 149.0 02/10/2024   Lab Results  Component Value Date   CHOLHDL 4 02/10/2024   Lab Results  Component Value Date   HGBA1C 5.8 02/10/2024       Assessment &  Plan:  Left hip pain Assessment & Plan: Check xray Tylenol  for pain ---   ice   Orders: -     DG HIP UNILAT W OR W/O PELVIS MIN 4 VIEWS LEFT; Future -     CBC with Differential/Platelet -     Comprehensive metabolic panel with GFR -     TSH -     Vitamin B12  Other fatigue -     CBC with Differential/Platelet -     Comprehensive metabolic panel with GFR -     TSH -     Vitamin B12  Foul smelling urine -     POCT urinalysis dipstick -     Urine Culture  Fall, initial encounter  Assessment and Plan Assessment & Plan Left hip pain after fall   Severe left hip pain began after a fall on Saturday, localized to the bone without visible bruising. Pain worsens with walking, though she can bear weight on the right foot. No fracture is suspected due to her ability to walk, but the pain is significant.  Suspected urinary tract infection   Foul-smelling urine and increased frequency of urination suggest a urinary tract infection, though there is no dysuria. A previous kidney infection is noted.  Dizziness   Chronic dizziness is unrelated to the recent fall. Previous therapies were attended. Eye issues are present and treated with eye drops and antibiotics.  Fatigue   She reports a lack of energy, possibly related to the suspected urinary tract infection.    Claressa Hughley R Lowne Chase, DO

## 2024-06-22 NOTE — ED Provider Triage Note (Signed)
 Emergency Medicine Provider Triage Evaluation Note  Christina Mckinney , a 89 y.o. female  was evaluated in triage.  Pt complains of left hip pain, sent over from PCP due to left hip fracture, patient in pain.   Review of Systems  Positive: Left hip pain Negative: Chest pain, shortness of breath, abdominal pain, nausea, vomiting   Physical Exam  BP 139/74 (BP Location: Left Arm)   Pulse 97   Temp 99 F (37.2 C) (Oral)   Resp 18   Ht 5' 1 (1.549 m)   Wt 62.7 kg   SpO2 94%   BMI 26.11 kg/m  Gen:   Awake, no distress   Resp:  Normal effort  MSK:   Moves extremities without difficulty  Other:  Left hip pain tender to palpation  Medical Decision Making  Medically screening exam initiated at 7:42 PM.  Appropriate orders placed.  JAANVI FIZER was informed that the remainder of the evaluation will be completed by another provider, this initial triage assessment does not replace that evaluation, and the importance of remaining in the ED until their evaluation is complete.  Orders: EKG and pain meds, CBC and Cmp and UA completed outpatient today   Janetta Terrall FALCON, [place] 06/22/24 1945

## 2024-06-22 NOTE — ED Notes (Signed)
 Recalled consult

## 2024-06-22 NOTE — Assessment & Plan Note (Signed)
 Check xray Tylenol  for pain ---   ice

## 2024-06-22 NOTE — Telephone Encounter (Signed)
 FYI Only or Action Required?: FYI only for provider.  Patient was last seen in primary care on 02/10/2024 by Domenica Harlene LABOR, MD.  Called Nurse Triage reporting Fall. - pt also has dark off smelling urine.  Symptoms began several days ago.  Interventions attempted: Nothing.  Symptoms are: gradually worsening.  Triage Disposition: See HCP Within 4 Hours (Or PCP Triage)  Patient/caregiver understands and will follow disposition?: Yes - pt need to sign DPR for daughter.                 Copied from CRM [id number]. Topic: Clinical - Red Word Triage >> Jun 22, 2024 10:21 AM Gennette ORN wrote: Red Word that prompted transfer to Nurse Triage: Patient she fell on Saturday flipped over on the recyliner. She is very sore but head is okay. Reason for Disposition  [1] MODERATE weakness (e.g., interferes with work, school, normal activities) AND [2] new-onset or getting worse  Answer Assessment - Initial Assessment Questions 1. MECHANISM: How did the fall happen?     Recliner fell back 2. DOMESTIC VIOLENCE AND ELDER ABUSE SCREENING: Did you fall because someone pushed you or tried to hurt you? If Yes, ask: Are you safe now?     no 3. ONSET: When did the fall happen? (e.g., minutes, hours, or days ago)     Saturday 4. LOCATION: What part of the body hit the ground? (e.g., back, buttocks, head, hips, knees, hands, head, stomach)     Hip and head 5. INJURY: Did you hurt (injure) yourself when you fell? If Yes, ask: What did you injure? Tell me more about this? (e.g., body area; type of injury; pain severity)     Hip and head 6. PAIN: Is there any pain? If Yes, ask: How bad is the pain? (e.g., Scale 0-10; or none, mild,      Hip pain -  7. SIZE: For cuts, bruises, or swelling, ask: How large is it? (e.g., inches or centimeters)      no  9. OTHER SYMPTOMS: Do you have any other symptoms? (e.g., dizziness, fever, weakness; new-onset or worsening).      Dizzy for a  long time. Off smell to urine - dark color 10. CAUSE: What do you think caused the fall (or falling)? (e.g., dizzy spell, tripped)       Sat in chair that was partially open  Protocols used: Falls and [facility]

## 2024-06-22 NOTE — Telephone Encounter (Signed)
 Patient scheduled to see Dr. Laury Axon -Almeta Monas today

## 2024-06-22 NOTE — ED Triage Notes (Signed)
 Pt reports fall on Saturday. XR done today showed L hip fx.

## 2024-06-22 NOTE — ED Provider Notes (Signed)
 [place] EMERGENCY DEPARTMENT AT [facility] Provider Note   CSN: [id number] Arrival date & time: 06/22/24  1736     Patient presents with: Hip Pain   Christina Mckinney is a 89 y.o. female who presents to the emergency department with a chief complaint of left hip pain.  Patient had a fall on Saturday and an outpatient x-ray was completed which showed a left hip fracture.  Patient was instructed to report to the [facility] emergency department by her primary care provider.  Patient states that she has not taken anything yet for pain, and has been ambulatory with some difficulty with a walker.  Denies head or neck injury, patient states the only place that she is currently in pain is her left hip.  Denies fever, chills, chest pain, shortness of breath, abdominal pain, nausea, vomiting.  Past medical history significant for hyperlipidemia, gout, anemia, essential tremor, restless leg syndrome, peripheral neuropathy, benign positional vertigo, hypertension, bilateral carotid artery disease, internal hemorrhoids, diverticulosis, diabetes, diverticulitis, osteoarthritis, COPD, varicose veins, hypertensive heart disease, chronic systolic CHF, chronic renal insufficiency, peripheral vascular disease, hypothyroidism, etc.    Hip Pain       Prior to Admission medications   Medication Sig Start Date End Date Taking? Authorizing Provider  erythromycin  ophthalmic ointment Place 1 Application into both eyes at bedtime. 06/03/24  Yes [provider]  febuxostat  (ULORIC ) 40 MG tablet Take 1 tablet (40 mg total) by mouth daily. 02/13/24  Yes Domenica Harlene LABOR, MD  furosemide  (LASIX ) 40 MG tablet Take 1 tablet (40 mg total) by mouth daily. 09/05/22  Yes Daryl Setter, NP  losartan  (COZAAR ) 25 MG tablet Take 1 tablet (25 mg total) by mouth daily. 02/10/24  Yes Domenica Harlene LABOR, MD  metoprolol  succinate (TOPROL -XL) 100 MG 24 hr tablet Take 1 tablet (100 mg total) by mouth daily. Take with  or immediately following a meal. 02/10/24  Yes Domenica Harlene LABOR, MD  Olopatadine  HCl (PATADAY  OP) Apply 1 drop to eye daily.   Yes [provider]  pantoprazole  (PROTONIX ) 40 MG tablet Take 1 tablet (40 mg total) by mouth 2 (two) times daily before a meal. 04/20/24  Yes Domenica Harlene LABOR, MD  SYNTHROID  50 MCG tablet Take 1 tablet (50 mcg total) by mouth daily before breakfast. 02/10/24  Yes Domenica Harlene LABOR, MD  VEVYE  0.1 % SOLN Place 1 drop into both eyes in the morning and at bedtime. 06/11/24  Yes [provider]  albuterol  (VENTOLIN  HFA) 108 (90 Base) MCG/ACT inhaler USE 2 INHALATIONS EVERY 6 HOURS AS NEEDED FOR WHEEZING OR SHORTNESS OF BREATH 08/28/21   Domenica Harlene LABOR, MD  aspirin  81 MG tablet Take 1 tablet (81 mg total) by mouth daily. Patient not taking: No sig reported 04/29/12   Georgian, Doe-Hyun R, DO    Allergies: Simvastatin , Crestor  [rosuvastatin ], Lipitor [atorvastatin ], Red dye #40 (allura red), and Statins    Review of Systems  Musculoskeletal:  Positive for arthralgias (left hip pain).    Updated Vital Signs BP (!) 143/86   Pulse 77   Temp 97.8 F (36.6 C)   Resp 15   Ht 5' 1 (1.549 m)   Wt 62.7 kg   SpO2 99%   BMI 26.11 kg/m   Physical Exam Vitals and nursing note reviewed.  Constitutional:      General: She is awake. She is not in acute distress.    Appearance: Normal appearance. She is not ill-appearing, toxic-appearing or diaphoretic.  HENT:  Head: Normocephalic and atraumatic.  Eyes:     General: No scleral icterus. Pulmonary:     Effort: Pulmonary effort is normal. No respiratory distress.  Musculoskeletal:     Right lower leg: No edema.     Left lower leg: No edema.     Comments: Pain with palpation to left hip, positive logroll test of left lower extremity, left lower extremity neurovascularly intact, reduced range of motion of left hip due to pain  Skin:    General: Skin is warm.     Capillary Refill: Capillary refill takes less than 2  seconds.  Neurological:     General: No focal deficit present.     Mental Status: She is alert and oriented to person, place, and time.  Psychiatric:        Mood and Affect: Mood normal.        Behavior: Behavior normal. Behavior is cooperative.     (all labs ordered are listed, but only abnormal results are displayed) Labs Reviewed - No data to display  EKG: None  Radiology: DG HIP UNILAT WITH PELVIS 2-3 VIEWS LEFT Result Date: 06/22/2024 CLINICAL DATA:  Fall, left hip pain EXAM: DG HIP (WITH OR WITHOUT PELVIS) 2-3V*L* COMPARISON:  None Available. FINDINGS: There is angulation of the left femoral neck with mild impaction compatible with impacted femoral neck fracture with slight valgus angulation. No subluxation or dislocation. Early degenerative changes in the hips. IMPRESSION: Impacted left femoral neck fracture. Electronically Signed   By: Franky Crease M.D.   On: 06/22/2024 14:07     Procedures   Medications Ordered in the ED  oxyCODONE -acetaminophen  (PERCOCET/ROXICET) 5-325 MG per tablet 1 tablet (1 tablet Oral Given 06/22/24 1951)                                    Medical Decision Making Risk Prescription drug management. Decision regarding hospitalization.   Patient presents to the ED for concern of left hip pain, mechanical fall, this involves an extensive number of treatment options, and is a complaint that carries with it a high risk of complications and morbidity.  The differential diagnosis includes hip fracture, dislocation, soft tissue injury, pelvic injury, spinal injury, etc.   Co morbidities that complicate the patient evaluation  hyperlipidemia, gout, anemia, essential tremor, restless leg syndrome, peripheral neuropathy, benign positional vertigo, hypertension, bilateral carotid artery disease, internal hemorrhoids, diverticulosis, diabetes, diverticulitis, osteoarthritis, COPD, varicose veins, hypertensive heart disease, chronic systolic CHF, chronic  renal insufficiency, peripheral vascular disease, hypothyroidism   Additional history obtained:  Additional history obtained from chart including outpatient x-rays which were significant for impacted left femoral neck fracture, I also reviewed patient's outpatient labs which were completed today as well as note from primary care provider  Lab Tests:  Labs were not reordered as they were completed today outpatient   Imaging Studies ordered:  X-ray was not repeated because it was completed today outpatient however significant for left impacted femoral neck fracture   Medicines ordered and prescription drug management:  I ordered medication including Percocet for pain Reevaluation of the patient after these medicines showed that the patient improved I have reviewed the patients home medicines and have made adjustments as needed   Test Considered:  None   Critical Interventions:  None   Consultations Obtained:  I requested consultation with the orthopedic team (Dr. Ernie), hospitalist (Dr. Alfornia)  and discussed lab and imaging findings as  well as pertinent plan - they recommend: admission to the hospital and that the orthopedic team will see the patient   Problem List / ED Course:  89 year old female, referred to [facility] emergency department due to outpatient x-ray significant for left hip fracture, mechanical fall on Saturday, patient has attempted to be ambulatory since then with a walker with discomfort Vital signs stable, lab work completed outpatient today, EKG ordered On physical exam patient left hip tender to palpation, positive logroll test of left lower extremity Outpatient labs and imaging already completed significant for left impacted femoral neck fracture Patient given Percocet for pain, on reassessment patient improved  Consult placed to orthopedics, I spoke with Dr. Ernie who states his team will see the patient once they are admitted Consulted  hospitalist, spoke with Dr. Alfornia who will admit patient, she stated that it may be some time as she has multiple admissions currently however At end of my shift made oncoming PA Ubaldo High aware that this patient will be admitted but has not been seen by hospitalist team yet, he understands and will manage pain if needed until hospitalist make it to see this patient Plan at end of my shift is to have patient admitted to the hospital for orthopedic evaluation as well as pain control, most likely diagnosis at this time is impacted left femoral neck fracture due to mechanical fall, patient denies blood thinner   Reevaluation:  After the interventions noted above, I reevaluated the patient and found that they have :improved   Social Determinants of Health:  none   Dispostion:  After consideration of the diagnostic results and the patients response to treatment, I feel that the patient would benefit from admission to the hospital and orthopedic evaluation      Final diagnoses:  Closed fracture of neck of left femur, initial encounter [facility])  Left hip pain  Fall, initial encounter    ED Discharge Orders     None          Janetta Terrall FALCON, PA-C 06/23/24 0047    Mannie Pac T, DO 06/24/24 0901

## 2024-06-23 ENCOUNTER — Other Ambulatory Visit: Payer: Self-pay

## 2024-06-23 ENCOUNTER — Encounter (HOSPITAL_COMMUNITY)
Admission: EM | Disposition: A | Discharge status: Skilled Nursing Facility | Payer: Self-pay | Source: Home / Self Care | Attending: Internal Medicine

## 2024-06-23 ENCOUNTER — Inpatient Hospital Stay (HOSPITAL_COMMUNITY): Admitting: Certified Registered Nurse Anesthetist

## 2024-06-23 ENCOUNTER — Inpatient Hospital Stay (HOSPITAL_COMMUNITY)

## 2024-06-23 ENCOUNTER — Encounter (HOSPITAL_COMMUNITY): Payer: Self-pay | Admitting: Internal Medicine

## 2024-06-23 ENCOUNTER — Ambulatory Visit: Payer: Self-pay | Admitting: Family Medicine

## 2024-06-23 DIAGNOSIS — N3289 Other specified disorders of bladder: Secondary | ICD-10-CM | POA: Diagnosis present

## 2024-06-23 DIAGNOSIS — S72002A Fracture of unspecified part of neck of left femur, initial encounter for closed fracture: Secondary | ICD-10-CM

## 2024-06-23 DIAGNOSIS — I5032 Chronic diastolic (congestive) heart failure: Secondary | ICD-10-CM | POA: Diagnosis not present

## 2024-06-23 DIAGNOSIS — D62 Acute posthemorrhagic anemia: Secondary | ICD-10-CM | POA: Diagnosis not present

## 2024-06-23 DIAGNOSIS — I110 Hypertensive heart disease with heart failure: Secondary | ICD-10-CM

## 2024-06-23 DIAGNOSIS — J449 Chronic obstructive pulmonary disease, unspecified: Secondary | ICD-10-CM | POA: Diagnosis not present

## 2024-06-23 DIAGNOSIS — M109 Gout, unspecified: Secondary | ICD-10-CM | POA: Diagnosis present

## 2024-06-23 DIAGNOSIS — H3530 Unspecified macular degeneration: Secondary | ICD-10-CM | POA: Diagnosis present

## 2024-06-23 DIAGNOSIS — Z96651 Presence of right artificial knee joint: Secondary | ICD-10-CM | POA: Diagnosis present

## 2024-06-23 DIAGNOSIS — I2510 Atherosclerotic heart disease of native coronary artery without angina pectoris: Secondary | ICD-10-CM | POA: Diagnosis present

## 2024-06-23 DIAGNOSIS — R0902 Hypoxemia: Secondary | ICD-10-CM | POA: Diagnosis present

## 2024-06-23 DIAGNOSIS — S72009A Fracture of unspecified part of neck of unspecified femur, initial encounter for closed fracture: Secondary | ICD-10-CM | POA: Diagnosis present

## 2024-06-23 DIAGNOSIS — E785 Hyperlipidemia, unspecified: Secondary | ICD-10-CM | POA: Diagnosis present

## 2024-06-23 DIAGNOSIS — Z79890 Hormone replacement therapy (postmenopausal): Secondary | ICD-10-CM | POA: Diagnosis not present

## 2024-06-23 DIAGNOSIS — I5022 Chronic systolic (congestive) heart failure: Secondary | ICD-10-CM | POA: Diagnosis not present

## 2024-06-23 DIAGNOSIS — K5900 Constipation, unspecified: Secondary | ICD-10-CM | POA: Diagnosis not present

## 2024-06-23 DIAGNOSIS — S72002P Fracture of unspecified part of neck of left femur, subsequent encounter for closed fracture with malunion: Secondary | ICD-10-CM | POA: Diagnosis not present

## 2024-06-23 DIAGNOSIS — W1830XA Fall on same level, unspecified, initial encounter: Secondary | ICD-10-CM | POA: Diagnosis present

## 2024-06-23 DIAGNOSIS — Y92009 Unspecified place in unspecified non-institutional (private) residence as the place of occurrence of the external cause: Secondary | ICD-10-CM | POA: Diagnosis not present

## 2024-06-23 DIAGNOSIS — R918 Other nonspecific abnormal finding of lung field: Secondary | ICD-10-CM | POA: Diagnosis not present

## 2024-06-23 DIAGNOSIS — D72829 Elevated white blood cell count, unspecified: Secondary | ICD-10-CM | POA: Diagnosis not present

## 2024-06-23 DIAGNOSIS — J440 Chronic obstructive pulmonary disease with acute lower respiratory infection: Secondary | ICD-10-CM | POA: Diagnosis present

## 2024-06-23 DIAGNOSIS — I5042 Chronic combined systolic (congestive) and diastolic (congestive) heart failure: Secondary | ICD-10-CM | POA: Diagnosis present

## 2024-06-23 DIAGNOSIS — J189 Pneumonia, unspecified organism: Secondary | ICD-10-CM | POA: Diagnosis not present

## 2024-06-23 DIAGNOSIS — I4510 Unspecified right bundle-branch block: Secondary | ICD-10-CM | POA: Diagnosis present

## 2024-06-23 DIAGNOSIS — I10 Essential (primary) hypertension: Secondary | ICD-10-CM | POA: Diagnosis not present

## 2024-06-23 DIAGNOSIS — N1832 Chronic kidney disease, stage 3b: Secondary | ICD-10-CM | POA: Diagnosis present

## 2024-06-23 DIAGNOSIS — K219 Gastro-esophageal reflux disease without esophagitis: Secondary | ICD-10-CM | POA: Diagnosis present

## 2024-06-23 DIAGNOSIS — I130 Hypertensive heart and chronic kidney disease with heart failure and stage 1 through stage 4 chronic kidney disease, or unspecified chronic kidney disease: Secondary | ICD-10-CM | POA: Diagnosis present

## 2024-06-23 DIAGNOSIS — E1122 Type 2 diabetes mellitus with diabetic chronic kidney disease: Secondary | ICD-10-CM | POA: Diagnosis present

## 2024-06-23 DIAGNOSIS — Z471 Aftercare following joint replacement surgery: Secondary | ICD-10-CM | POA: Diagnosis not present

## 2024-06-23 DIAGNOSIS — R0989 Other specified symptoms and signs involving the circulatory and respiratory systems: Secondary | ICD-10-CM | POA: Diagnosis not present

## 2024-06-23 DIAGNOSIS — G2581 Restless legs syndrome: Secondary | ICD-10-CM | POA: Diagnosis present

## 2024-06-23 DIAGNOSIS — Z96642 Presence of left artificial hip joint: Secondary | ICD-10-CM

## 2024-06-23 DIAGNOSIS — K5909 Other constipation: Secondary | ICD-10-CM | POA: Diagnosis present

## 2024-06-23 DIAGNOSIS — G250 Essential tremor: Secondary | ICD-10-CM | POA: Diagnosis present

## 2024-06-23 DIAGNOSIS — E1151 Type 2 diabetes mellitus with diabetic peripheral angiopathy without gangrene: Secondary | ICD-10-CM | POA: Diagnosis present

## 2024-06-23 DIAGNOSIS — W19XXXA Unspecified fall, initial encounter: Secondary | ICD-10-CM

## 2024-06-23 DIAGNOSIS — E039 Hypothyroidism, unspecified: Secondary | ICD-10-CM | POA: Diagnosis present

## 2024-06-23 DIAGNOSIS — I2585 Chronic coronary microvascular dysfunction: Secondary | ICD-10-CM | POA: Diagnosis not present

## 2024-06-23 DIAGNOSIS — E1142 Type 2 diabetes mellitus with diabetic polyneuropathy: Secondary | ICD-10-CM | POA: Diagnosis present

## 2024-06-23 DIAGNOSIS — I471 Supraventricular tachycardia, unspecified: Secondary | ICD-10-CM | POA: Diagnosis not present

## 2024-06-23 DIAGNOSIS — Z96611 Presence of right artificial shoulder joint: Secondary | ICD-10-CM | POA: Diagnosis not present

## 2024-06-23 DIAGNOSIS — Z79899 Other long term (current) drug therapy: Secondary | ICD-10-CM | POA: Diagnosis not present

## 2024-06-23 HISTORY — PX: TOTAL HIP ARTHROPLASTY: SHX124

## 2024-06-23 LAB — CBC
HCT: 36.6 % (ref 36.0–46.0)
Hemoglobin: 11.7 g/dL — ABNORMAL LOW (ref 12.0–15.0)
MCH: 31.7 pg (ref 26.0–34.0)
MCHC: 32 g/dL (ref 30.0–36.0)
MCV: 99.2 fL (ref 80.0–100.0)
Platelets: 182 K/uL (ref 150–400)
RBC: 3.69 MIL/uL — ABNORMAL LOW (ref 3.87–5.11)
RDW: 13.8 % (ref 11.5–15.5)
WBC: 8.7 K/uL (ref 4.0–10.5)
nRBC: 0 % (ref 0.0–0.2)

## 2024-06-23 LAB — ABO/RH: ABO/RH(D): A POS

## 2024-06-23 LAB — SURGICAL PCR SCREEN
MRSA, PCR: NEGATIVE
Staphylococcus aureus: NEGATIVE

## 2024-06-23 LAB — TYPE AND SCREEN
ABO/RH(D): A POS
Antibody Screen: NEGATIVE

## 2024-06-23 LAB — URINE CULTURE
MICRO NUMBER:: 16813145
Result:: NO GROWTH
SPECIMEN QUALITY:: ADEQUATE

## 2024-06-23 SURGERY — ARTHROPLASTY, HIP, TOTAL, ANTERIOR APPROACH
Anesthesia: Spinal | Site: Hip | Laterality: Left

## 2024-06-23 MED ORDER — IPRATROPIUM-ALBUTEROL 0.5-2.5 (3) MG/3ML IN SOLN: 3.0000 mL | Freq: Four times a day (QID) | RESPIRATORY_TRACT | Status: DC | PRN

## 2024-06-23 MED ORDER — LEVOTHYROXINE SODIUM 50 MCG PO TABS
50.0000 ug | ORAL_TABLET | Freq: Every day | ORAL | Status: DC
  Administered 2024-06-23 – 2024-06-29 (×9): 50 ug via ORAL
  Filled 2024-06-23 (×7): qty 1

## 2024-06-23 MED ORDER — TRANEXAMIC ACID-NACL 1000-0.7 MG/100ML-% IV SOLN
1000.0000 mg | Freq: Once | INTRAVENOUS | Status: AC
  Administered 2024-06-23 (×2): 1000 mg via INTRAVENOUS
  Filled 2024-06-23: qty 100

## 2024-06-23 MED ORDER — ONDANSETRON HCL 4 MG PO TABS: 4.0000 mg | ORAL_TABLET | Freq: Four times a day (QID) | ORAL | Status: DC | PRN

## 2024-06-23 MED ORDER — IPRATROPIUM-ALBUTEROL 0.5-2.5 (3) MG/3ML IN SOLN
3.0000 mL | Freq: Three times a day (TID) | RESPIRATORY_TRACT | Status: DC
  Administered 2024-06-23 (×2): 3 mL via RESPIRATORY_TRACT
  Filled 2024-06-23: qty 3

## 2024-06-23 MED ORDER — MORPHINE SULFATE (PF) 2 MG/ML IV SOLN: 0.5000 mg | INTRAVENOUS | Status: DC | PRN

## 2024-06-23 MED ORDER — SODIUM CHLORIDE 0.9 % IV SOLN: INTRAVENOUS | Status: DC

## 2024-06-23 MED ORDER — TRANEXAMIC ACID-NACL 1000-0.7 MG/100ML-% IV SOLN
1000.0000 mg | INTRAVENOUS | Status: AC
  Administered 2024-06-23 (×2): 1000 mg via INTRAVENOUS

## 2024-06-23 MED ORDER — BUPIVACAINE IN DEXTROSE 0.75-8.25 % IT SOLN
INTRATHECAL | Status: DC | PRN
  Administered 2024-06-23 (×2): 1.5 mL via INTRATHECAL

## 2024-06-23 MED ORDER — CHLORHEXIDINE GLUCONATE 4 % EX SOLN: 60.0000 mL | Freq: Once | CUTANEOUS | Status: DC

## 2024-06-23 MED ORDER — BISACODYL 10 MG RE SUPP: 10.0000 mg | Freq: Every day | RECTAL | Status: DC | PRN

## 2024-06-23 MED ORDER — PROPOFOL 500 MG/50ML IV EMUL
INTRAVENOUS | Status: AC
  Filled 2024-06-23: qty 50

## 2024-06-23 MED ORDER — ASPIRIN 81 MG PO CHEW
81.0000 mg | CHEWABLE_TABLET | Freq: Two times a day (BID) | ORAL | Status: DC
  Administered 2024-06-24 – 2024-06-29 (×13): 81 mg via ORAL
  Filled 2024-06-23 (×11): qty 1

## 2024-06-23 MED ORDER — MENTHOL 3 MG MT LOZG: 1.0000 | LOZENGE | OROMUCOSAL | Status: DC | PRN

## 2024-06-23 MED ORDER — DEXAMETHASONE SODIUM PHOSPHATE 10 MG/ML IJ SOLN
10.0000 mg | Freq: Once | INTRAMUSCULAR | Status: AC
  Administered 2024-06-24 (×2): 10 mg via INTRAVENOUS
  Filled 2024-06-23: qty 1

## 2024-06-23 MED ORDER — NALOXONE HCL 0.4 MG/ML IJ SOLN: 0.4000 mg | INTRAMUSCULAR | Status: DC | PRN

## 2024-06-23 MED ORDER — POVIDONE-IODINE 10 % EX SWAB: 2.0000 | Freq: Once | CUTANEOUS | Status: DC

## 2024-06-23 MED ORDER — DIPHENHYDRAMINE HCL 12.5 MG/5ML PO ELIX: 12.5000 mg | ORAL_SOLUTION | ORAL | Status: DC | PRN

## 2024-06-23 MED ORDER — KETOROLAC TROMETHAMINE 30 MG/ML IJ SOLN
INTRAMUSCULAR | Status: AC
  Filled 2024-06-23: qty 1

## 2024-06-23 MED ORDER — ONDANSETRON HCL 4 MG/2ML IJ SOLN
4.0000 mg | Freq: Four times a day (QID) | INTRAMUSCULAR | Status: DC | PRN
  Administered 2024-06-24 (×2): 4 mg via INTRAVENOUS
  Filled 2024-06-23: qty 2

## 2024-06-23 MED ORDER — ONDANSETRON HCL 4 MG/2ML IJ SOLN
INTRAMUSCULAR | Status: AC
  Filled 2024-06-23: qty 2

## 2024-06-23 MED ORDER — SODIUM CHLORIDE (PF) 0.9 % IJ SOLN
INTRAMUSCULAR | Status: AC
  Filled 2024-06-23: qty 50

## 2024-06-23 MED ORDER — PROPOFOL 10 MG/ML IV BOLUS
INTRAVENOUS | Status: DC | PRN
  Administered 2024-06-23 (×2): 20 mg via INTRAVENOUS
  Administered 2024-06-23 (×2): 30 mg via INTRAVENOUS

## 2024-06-23 MED ORDER — FUROSEMIDE 40 MG PO TABS: 40.0000 mg | ORAL_TABLET | Freq: Every day | ORAL | Status: DC

## 2024-06-23 MED ORDER — PANTOPRAZOLE SODIUM 40 MG PO TBEC: 40.0000 mg | DELAYED_RELEASE_TABLET | Freq: Two times a day (BID) | ORAL | Status: DC

## 2024-06-23 MED ORDER — OXYCODONE HCL 5 MG PO TABS
5.0000 mg | ORAL_TABLET | ORAL | Status: DC | PRN
  Administered 2024-06-24 – 2024-06-26 (×6): 5 mg via ORAL
  Filled 2024-06-23 (×5): qty 1

## 2024-06-23 MED ORDER — PROPOFOL 500 MG/50ML IV EMUL
INTRAVENOUS | Status: DC | PRN
  Administered 2024-06-23 (×2): 50 ug/kg/min via INTRAVENOUS

## 2024-06-23 MED ORDER — ACETAMINOPHEN 10 MG/ML IV SOLN
INTRAVENOUS | Status: AC
  Filled 2024-06-23: qty 100

## 2024-06-23 MED ORDER — CYCLOSPORINE 0.05 % OP EMUL
1.0000 [drp] | Freq: Two times a day (BID) | OPHTHALMIC | Status: DC
  Administered 2024-06-23 – 2024-06-29 (×17): 1 [drp] via OPHTHALMIC
  Filled 2024-06-23 (×13): qty 30

## 2024-06-23 MED ORDER — METHOCARBAMOL 1000 MG/10ML IJ SOLN: 500.0000 mg | Freq: Four times a day (QID) | INTRAMUSCULAR | Status: DC | PRN

## 2024-06-23 MED ORDER — MORPHINE SULFATE (PF) 2 MG/ML IV SOLN: 1.0000 mg | INTRAVENOUS | Status: DC | PRN

## 2024-06-23 MED ORDER — TRANEXAMIC ACID-NACL 1000-0.7 MG/100ML-% IV SOLN
INTRAVENOUS | Status: AC
  Filled 2024-06-23: qty 100

## 2024-06-23 MED ORDER — ONDANSETRON HCL 4 MG/2ML IJ SOLN
4.0000 mg | Freq: Four times a day (QID) | INTRAMUSCULAR | Status: DC | PRN
  Administered 2024-06-23 (×2): 4 mg via INTRAVENOUS
  Filled 2024-06-23: qty 2

## 2024-06-23 MED ORDER — CEFAZOLIN SODIUM-DEXTROSE 2-4 GM/100ML-% IV SOLN
2.0000 g | Freq: Four times a day (QID) | INTRAVENOUS | Status: AC
  Administered 2024-06-24 (×4): 2 g via INTRAVENOUS
  Filled 2024-06-23 (×2): qty 100

## 2024-06-23 MED ORDER — CYCLOSPORINE 0.1 % OP SOLN: 1.0000 [drp] | Freq: Two times a day (BID) | OPHTHALMIC | Status: DC

## 2024-06-23 MED ORDER — OXYCODONE HCL 5 MG PO TABS: 5.0000 mg | ORAL_TABLET | Freq: Four times a day (QID) | ORAL | Status: DC | PRN

## 2024-06-23 MED ORDER — PHENYLEPHRINE HCL-NACL 20-0.9 MG/250ML-% IV SOLN
INTRAVENOUS | Status: DC | PRN
  Administered 2024-06-23 (×2): 30 ug/min via INTRAVENOUS

## 2024-06-23 MED ORDER — ALBUTEROL SULFATE (2.5 MG/3ML) 0.083% IN NEBU: 2.5000 mg | INHALATION_SOLUTION | Freq: Four times a day (QID) | RESPIRATORY_TRACT | Status: DC | PRN

## 2024-06-23 MED ORDER — ONDANSETRON HCL 4 MG/2ML IJ SOLN
INTRAMUSCULAR | Status: DC | PRN
  Administered 2024-06-23 (×2): 4 mg via INTRAVENOUS

## 2024-06-23 MED ORDER — METOCLOPRAMIDE HCL 5 MG/ML IJ SOLN: 5.0000 mg | Freq: Three times a day (TID) | INTRAMUSCULAR | Status: DC | PRN

## 2024-06-23 MED ORDER — BUPIVACAINE-EPINEPHRINE (PF) 0.25% -1:200000 IJ SOLN
INTRAMUSCULAR | Status: AC
  Filled 2024-06-23: qty 30

## 2024-06-23 MED ORDER — METHOCARBAMOL 1000 MG/10ML IJ SOLN
500.0000 mg | Freq: Four times a day (QID) | INTRAMUSCULAR | Status: AC | PRN
Start: 2024-06-23 — End: ?

## 2024-06-23 MED ORDER — 0.9 % SODIUM CHLORIDE (POUR BTL) OPTIME
TOPICAL | Status: DC | PRN
  Administered 2024-06-23 (×2): 1000 mL

## 2024-06-23 MED ORDER — ACETAMINOPHEN 500 MG PO TABS
1000.0000 mg | ORAL_TABLET | Freq: Four times a day (QID) | ORAL | Status: AC
  Administered 2024-06-23 – 2024-06-27 (×18): 1000 mg via ORAL
  Filled 2024-06-23 (×16): qty 2

## 2024-06-23 MED ORDER — SENNA 8.6 MG PO TABS
2.0000 | ORAL_TABLET | Freq: Every day | ORAL | Status: DC
Start: 2024-06-23 — End: 2024-06-27
  Administered 2024-06-24 – 2024-06-25 (×3): 17.2 mg via ORAL
  Filled 2024-06-23 (×4): qty 2

## 2024-06-23 MED ORDER — SODIUM CHLORIDE (PF) 0.9 % IJ SOLN
INTRAMUSCULAR | Status: DC | PRN
  Administered 2024-06-23 (×2): 61 mL

## 2024-06-23 MED ORDER — HYDROMORPHONE HCL 1 MG/ML IJ SOLN: 0.5000 mg | INTRAMUSCULAR | Status: DC | PRN

## 2024-06-23 MED ORDER — PANTOPRAZOLE SODIUM 40 MG IV SOLR
40.0000 mg | Freq: Two times a day (BID) | INTRAVENOUS | Status: DC
  Administered 2024-06-23 – 2024-06-26 (×11): 40 mg via INTRAVENOUS
  Filled 2024-06-23 (×8): qty 10

## 2024-06-23 MED ORDER — LACTATED RINGERS IV SOLN: INTRAVENOUS | Status: DC

## 2024-06-23 MED ORDER — ENSURE PLUS HIGH PROTEIN PO LIQD
237.0000 mL | Freq: Two times a day (BID) | ORAL | Status: DC
  Administered 2024-06-25 – 2024-06-29 (×6): 237 mL via ORAL

## 2024-06-23 MED ORDER — ALUM & MAG HYDROXIDE-SIMETH 200-200-20 MG/5ML PO SUSP
30.0000 mL | ORAL | Status: DC | PRN
  Administered 2024-06-24 (×2): 30 mL via ORAL
  Filled 2024-06-23: qty 30

## 2024-06-23 MED ORDER — SENNA 8.6 MG PO TABS
1.0000 | ORAL_TABLET | Freq: Every day | ORAL | Status: DC
  Administered 2024-06-23 (×2): 8.6 mg via ORAL
  Filled 2024-06-23: qty 1

## 2024-06-23 MED ORDER — METOCLOPRAMIDE HCL 5 MG PO TABS: 5.0000 mg | ORAL_TABLET | Freq: Three times a day (TID) | ORAL | Status: DC | PRN

## 2024-06-23 MED ORDER — OXYCODONE HCL 5 MG PO TABS
2.5000 mg | ORAL_TABLET | ORAL | Status: DC | PRN
  Administered 2024-06-26: 2.5 mg via ORAL
  Filled 2024-06-23 (×2): qty 1

## 2024-06-23 MED ORDER — DEXAMETHASONE SODIUM PHOSPHATE 10 MG/ML IJ SOLN
INTRAMUSCULAR | Status: AC
  Filled 2024-06-23: qty 1

## 2024-06-23 MED ORDER — SIMETHICONE 80 MG PO CHEW: 80.0000 mg | CHEWABLE_TABLET | Freq: Four times a day (QID) | ORAL | Status: DC | PRN

## 2024-06-23 MED ORDER — DEXAMETHASONE SODIUM PHOSPHATE 10 MG/ML IJ SOLN
8.0000 mg | Freq: Once | INTRAMUSCULAR | Status: AC
  Administered 2024-06-23 (×2): 8 mg via INTRAVENOUS

## 2024-06-23 MED ORDER — METHOCARBAMOL 500 MG PO TABS
500.0000 mg | ORAL_TABLET | Freq: Four times a day (QID) | ORAL | Status: DC | PRN
  Administered 2024-06-24 – 2024-06-28 (×8): 500 mg via ORAL
  Filled 2024-06-23 (×9): qty 1

## 2024-06-23 MED ORDER — ALBUTEROL SULFATE HFA 108 (90 BASE) MCG/ACT IN AERS: 1.0000 | INHALATION_SPRAY | Freq: Four times a day (QID) | RESPIRATORY_TRACT | Status: DC | PRN

## 2024-06-23 MED ORDER — POLYETHYLENE GLYCOL 3350 17 G PO PACK
17.0000 g | PACK | Freq: Two times a day (BID) | ORAL | Status: DC
  Administered 2024-06-23 – 2024-06-26 (×9): 17 g via ORAL
  Filled 2024-06-23 (×7): qty 1

## 2024-06-23 MED ORDER — METHOCARBAMOL 500 MG PO TABS: 500.0000 mg | ORAL_TABLET | Freq: Four times a day (QID) | ORAL | Status: DC | PRN

## 2024-06-23 MED ORDER — METOPROLOL SUCCINATE ER 50 MG PO TB24
100.0000 mg | ORAL_TABLET | Freq: Every day | ORAL | Status: DC
  Administered 2024-06-23 – 2024-06-29 (×6): 100 mg via ORAL
  Filled 2024-06-23 (×7): qty 2

## 2024-06-23 MED ORDER — PHENOL 1.4 % MT LIQD: 1.0000 | OROMUCOSAL | Status: DC | PRN

## 2024-06-23 MED ORDER — CEFAZOLIN SODIUM-DEXTROSE 2-4 GM/100ML-% IV SOLN
2.0000 g | INTRAVENOUS | Status: AC
  Administered 2024-06-23 (×2): 2 g via INTRAVENOUS
  Filled 2024-06-23: qty 100

## 2024-06-23 SURGICAL SUPPLY — 35 items
BAG COUNTER SPONGE SURGICOUNT (BAG) IMPLANT
BAG ZIPLOCK 12X15 (MISCELLANEOUS) ×2 IMPLANT
BLADE SAG 18X100X1.27 (BLADE) ×2 IMPLANT
COVER PERINEAL POST (MISCELLANEOUS) ×2 IMPLANT
COVER SURGICAL LIGHT HANDLE (MISCELLANEOUS) ×2 IMPLANT
CUP ACET PINNACLE SECTR 50MM (Hips) IMPLANT
DERMABOND ADVANCED .7 DNX12 (GAUZE/BANDAGES/DRESSINGS) ×2 IMPLANT
DRAPE FOOT SWITCH (DRAPES) ×2 IMPLANT
DRAPE STERI IOBAN 125X83 (DRAPES) ×2 IMPLANT
DRAPE U-SHAPE 47X51 STRL (DRAPES) ×4 IMPLANT
DRESSING AQUACEL AG SP 3.5X10 (GAUZE/BANDAGES/DRESSINGS) ×2 IMPLANT
DURAPREP 26ML APPLICATOR (WOUND CARE) ×2 IMPLANT
ELECT REM PT RETURN 15FT ADLT (MISCELLANEOUS) ×2 IMPLANT
GLOVE BIO SURGEON STRL SZ 6 (GLOVE) ×2 IMPLANT
GLOVE BIOGEL PI IND STRL 6.5 (GLOVE) ×2 IMPLANT
GLOVE BIOGEL PI IND STRL 7.5 (GLOVE) ×2 IMPLANT
GLOVE ORTHO TXT STRL SZ7.5 (GLOVE) ×4 IMPLANT
GOWN STRL REUS W/ TWL LRG LVL3 (GOWN DISPOSABLE) ×4 IMPLANT
HEAD FEM STD 32X+1 STRL (Hips) IMPLANT
KIT TURNOVER KIT A (KITS) ×2 IMPLANT
LINER ACET PNNCL PLUS4 NEUTRAL (Hips) IMPLANT
MANIFOLD NEPTUNE II (INSTRUMENTS) ×2 IMPLANT
NDL SAFETY ECLIPSE 18X1.5 (NEEDLE) ×2 IMPLANT
PACK ANTERIOR HIP CUSTOM (KITS) ×2 IMPLANT
PENCIL SMOKE EVACUATOR (MISCELLANEOUS) ×2 IMPLANT
SCREW 6.5MMX30MM (Screw) IMPLANT
STEM FEMORAL SZ5 HIGH ACTIS (Stem) IMPLANT
SUT MNCRL AB 4-0 PS2 18 (SUTURE) ×2 IMPLANT
SUT VIC AB 1 CT1 36 (SUTURE) ×6 IMPLANT
SUT VIC AB 2-0 CT1 TAPERPNT 27 (SUTURE) ×4 IMPLANT
SUTURE STRATFX 0 PDS 27 VIOLET (SUTURE) ×2 IMPLANT
TOWEL GREEN STERILE FF (TOWEL DISPOSABLE) ×2 IMPLANT
TRAY FOLEY MTR SLVR 14FR STAT (SET/KITS/TRAYS/PACK) IMPLANT
TUBE SUCTION HIGH CAP CLEAR NV (SUCTIONS) ×2 IMPLANT
WATER STERILE IRR 1000ML POUR (IV SOLUTION) ×2 IMPLANT

## 2024-06-23 NOTE — Anesthesia Procedure Notes (Signed)
 Procedure Name: MAC Date/Time: 06/23/2024 4:46 PM  Performed by: Judythe Tanda Aran, CRNAPre-anesthesia Checklist: Patient identified, Emergency Drugs available, Suction available and Patient being monitored Patient Re-evaluated:Patient Re-evaluated prior to induction Oxygen  Delivery Method: Simple face mask

## 2024-06-23 NOTE — H&P (View-Only) (Signed)
 Reason for Consult: left hip fracture Referring Physician: Madelyne, MD (Hospitalist)  Christina Mckinney is an 89 y.o. female.  HPI: Christina Mckinney is a 89 y.o. female with medical history significant of CAD, HFpEF, NSVT, DVT/PE, essential tremor, GERD, gout, macular degeneration, osteoarthritis, hyperlipidemia, hypertension, hypothyroidism, anemia, anxiety, depression, prediabetes, CKD stage IIIb, COPD, BPPV, and other medical comorbidities presenting with a chief complaint of left hip pain since after a fall on Saturday 8/9.  Patient states she has chronic dizziness and vision problems for which she has previously been evaluated by neurology and ophthalmology.  On Saturday, her 52-year-old granddaughter was holding her hand and helping her walk to her recliner so she could sit but due to vision problems, patient somehow missed sitting on the recliner and fell on the floor instead landing on her left hip.  She thinks she might have hit her head on the floor also but denies loss of consciousness.  Since after the fall, she has been able to walk using her walker but with a lot of difficulty due to severe pain in her left hip.  She reports chronic shortness of breath and denies any worsening shortness of breath in the past few days.  Denies cough or chest pain.  She reports chronic constipation but denies nausea, vomiting, or abdominal pain.  She does not take aspirin  or any other blood thinners at home.   Patient was seen by her PCP yesterday prior to ED arrival and had an x-ray done which showed impacted left femoral neck fracture.  Outpatient labs done at PCPs office notable for WBC count 11.4, BUN 31, creatinine 1.15.  Orthopedics consulted for management of left hip fracture  Past Medical History:  Diagnosis Date   Anemia, unspecified    Anxiety and depression 09/18/2015   Anxiety state, unspecified    Chest pain    Chronic systolic CHF (congestive heart failure) (HCC) 08/12/2015   Depression with  anxiety 06/02/2007   Qualifier: Diagnosis of  By: Georgian ROSALEA CHARM Lamar    Depressive disorder, not elsewhere classified    Diverticulitis    DVT (deep venous thrombosis) (HCC)    Dyspnea 07/22/2015   Edema 06/12/2014   Essential and other specified forms of tremor    GERD (gastroesophageal reflux disease)    Gout, unspecified    Internal hemorrhoids without mention of complication    Macular degeneration 08/07/2015   Osteoarthritis    Other abnormal glucose    Other and unspecified hyperlipidemia    Personal history of colonic polyps    Personal history of peptic ulcer disease    Pulmonary embolism (HCC)    Unspecified disorder of bladder    Unspecified essential hypertension    Unspecified hypothyroidism    Varicose veins     Past Surgical History:  Procedure Laterality Date   ABDOMINAL HYSTERECTOMY     CHOLECYSTECTOMY     ENDOVENOUS ABLATION SAPHENOUS VEIN W/ LASER  09-11-2012   left greater saphenous vein   by Krystal Doing MD   EYE SURGERY     KNEE ARTHROSCOPY     right    NASAL SEPTUM SURGERY     SHOULDER SURGERY     TOTAL KNEE ARTHROPLASTY  09/2013   Right    Family History  Problem Relation Age of Onset   Other Mother        varicose veins   Hip fracture Mother    Heart disease Father        CHF  Hyperlipidemia Father    Heart attack Father    Cancer Sister        stomach   Heart disease Sister    Heart disease Brother    Diabetes Daughter    Heart disease Daughter    Hyperlipidemia Daughter    Hypertension Daughter    Aneurysm Daughter    Colon cancer Neg Hx     Social History:  reports that she quit smoking about 35 years ago. Her smoking use included cigarettes. She started smoking about 75 years ago. She has never used smokeless tobacco. She reports current alcohol use of about 4.0 standard drinks of alcohol per week. She reports that she does not use drugs.  Allergies:  Allergies  Allergen Reactions   Simvastatin  Other (See Comments)    Hip pain    Crestor  [Rosuvastatin ]     Left hip pain   Lipitor [Atorvastatin ]     Pain in hips   Red Dye #40 (Allura Red)     Hip pain   Statins Other (See Comments)    Muscle aches, could not walk  Muscle aches, could not walk, Muscle aches, could not walk    Medications: I have reviewed the patient's current medications.  Results for orders placed or performed during the hospital encounter of 06/22/24 (from the past 24 hours)  CBC     Status: Abnormal   Collection Time: 06/23/24  6:11 AM  Result Value Ref Range   WBC 8.7 4.0 - 10.5 K/uL   RBC 3.69 (L) 3.87 - 5.11 MIL/uL   Hemoglobin 11.7 (L) 12.0 - 15.0 g/dL   HCT 63.3 63.9 - 53.9 %   MCV 99.2 80.0 - 100.0 fL   MCH 31.7 26.0 - 34.0 pg   MCHC 32.0 30.0 - 36.0 g/dL   RDW 86.1 88.4 - 84.4 %   Platelets 182 150 - 400 K/uL   nRBC 0.0 0.0 - 0.2 %     X-ray: EXAM: DG HIP (WITH OR WITHOUT PELVIS) 2-3V*L*   COMPARISON:  None Available.   FINDINGS: There is angulation of the left femoral neck with mild impaction compatible with impacted femoral neck fracture with slight valgus angulation. No subluxation or dislocation. Early degenerative changes in the hips.   IMPRESSION: Impacted left femoral neck fracture.     Electronically Signed   By: Franky Crease M.D.  ROS: AS per HPI  Blood pressure (!) 136/50, pulse 77, temperature 97.8 F (36.6 C), temperature source Oral, resp. rate 17, height 5' 1 (1.549 m), weight 62.7 kg, SpO2 92%.  Physical Exam: Constitutional:      General: She is not in acute distress. HENT:     Head: Normocephalic and atraumatic.  Eyes:     Extraocular Movements: Extraocular movements intact.  Cardiovascular:     Rate and Rhythm: Normal rate and regular rhythm.     Heart sounds: Normal heart sounds.  Pulmonary:     Effort: Pulmonary effort is normal. No respiratory distress.     Breath sounds: Normal breath sounds. No wheezing or rales.  Abdominal:     General: Bowel sounds are normal. There is no  distension.     Palpations: Abdomen is soft.     Tenderness: There is no abdominal tenderness. There is no guarding.  Musculoskeletal:     Cervical back: Normal range of motion.     Right lower leg: No edema.     Left lower leg: No edema.     Comments: Left lower  extremity neurovascularly intact. Slight shortening and mild external rotation, pain with movement  Skin:    General: Skin is warm and dry.  Neurological:     General: No focal deficit present.     Mental Status: She is alert and oriented to person, place, and time.   Assessment/Plan: Left hip femoral neck fracture  Plan: She will need operative management of this fracture in order to maintain her active lifestyle Will need to find OR time and likely help from colleague tomorrow  Orders will be placed accordingly Total hip versus hemiarthroplasty  Christina Mckinney 06/23/2024, 6:55 AM

## 2024-06-23 NOTE — Op Note (Signed)
 NAME:  Christina Mckinney                ACCOUNT NO.: [id number]      MEDICAL RECORD NO.: [id number]      FACILITY:  [facility]      PHYSICIAN:  [name]  DATE OF BIRTH:  December 03, 1931     DATE OF PROCEDURE:  06/23/2024                                 OPERATIVE REPORT         PREOPERATIVE DIAGNOSIS: Left hip femoral neck fracture      POSTOPERATIVE DIAGNOSIS:  Left hip femoral neck fracture      PROCEDURE:  Left total hip replacement through an anterior approach   utilizing DePuy THR system, component size 50 mm pinnacle cup, a size 32+4 neutral   Altrex liner, a size 6 Hi Actis stem with a 32+1 Articuleze metal head ball.      SURGEON:  Donnice JONETTA. Car, M.D.      ASSISTANT:  Rosina Calin, PA-C     ANESTHESIA:  Spinal.      SPECIMENS:  None.      COMPLICATIONS:  None.      BLOOD LOSS:  200 cc     DRAINS:  None.      INDICATION OF THE PROCEDURE:  Christina Mckinney is a 89 y.o. female who unfortunately had a ground level fall injuring her left hip.  She is very independent.  I discussed management of er hip fracture with a total hip replacement versus hemiarthroplasty or percutaneous screws.  She was in agreement after reviewing the pros and cons, risks and benefits of each.  Specific risks of infection, DVT, dislocation an neurovascular injury reviewed.  Consent obtained for the benefit of fracture management and pain relief     PROCEDURE IN DETAIL:  The patient was brought to operative theater.   Once adequate anesthesia, preoperative antibiotics, 2 gm of Ancef , 1 gm of Tranexamic Acid , and 10 mg of Decadron  were administered, the patient was positioned supine on the [employer] table.  Once the patient was safely positioned with adequate padding of boney prominences we predraped out the hip, and used fluoroscopy to confirm orientation of the pelvis.      The left hip was then prepped and draped from proximal iliac crest to   mid thigh with a shower curtain  technique.      Time-out was performed identifying the patient, planned procedure, and the appropriate extremity.     An incision was then made 2 cm lateral to the   anterior superior iliac spine extending over the orientation of the   tensor fascia lata muscle and sharp dissection was carried down to the   fascia of the muscle.      The fascia was then incised.  The muscle belly was identified and swept   laterally and retractor placed along the superior neck.  Following   cauterization of the circumflex vessels and removing some pericapsular   fat, a second cobra retractor was placed on the inferior neck.  A T-capsulotomy was made along the line of the   superior neck to the trochanteric fossa, then extended proximally and   distally.  Tag sutures were placed and the retractors were then placed   intracapsular.  We then identified the trochanteric fossa and  orientation of my neck cut and then made a neck osteotomy with the femur on traction.  The fractured femoral neck segment and the femoral  head were removed without difficulty or complication.  Traction was let   off and retractors were placed posterior and anterior around the   acetabulum.      The labrum and foveal tissue were debrided.  I began reaming with a 45 mm   reamer and reamed up to 49 mm reamer with good bony bed preparation and a 50 mm  cup was chosen.  The final 50 mm Pinnacle cup was then impacted under fluoroscopy to confirm the depth of penetration and orientation with respect to   Abduction and forward flexion.  A screw was placed into the ilium followed by the hole eliminator.  The final   32+4 neutral Altrex liner was impacted with good visualized rim fit.  The cup was positioned anatomically within the acetabular portion of the pelvis.      At this point, the femur was rolled to 100 degrees.  Further capsule was   released off the inferior aspect of the femoral neck.  I then   released the superior capsule  proximally.  With the leg in a neutral position the hook was placed laterally   along the femur under the vastus lateralis origin and elevated manually and then held in position using the hook attachment on the bed.  The leg was then extended and adducted with the leg rolled to 100   degrees of external rotation.  Retractors were placed along the medial calcar and posteriorly over the greater trochanter.  Once the proximal femur was fully   exposed, I used a box osteotome to set orientation.  I then began   broaching with the starting chili pepper broach and passed this by hand and then broached up to 6.  With the  6 broach in place I chose a high offset neck and did several trial reductions.  The offset was appropriate, leg lengths   appeared to be equal best matched with the +1 head ball trial confirmed radiographically.   Given these findings, I went ahead and dislocated the hip, repositioned all   retractors and positioned the right hip in the extended and abducted position.  The final 6 Hi Actis stem was   chosen and it was impacted down to the level of neck cut.  Based on this   and the trial reductions, a final 32+1 Articuleze metal head ball was chosen and   impacted onto a clean and dry trunnion, and the hip was reduced.  The   hip had been irrigated throughout the case again at this point.  I did   reapproximate the superior capsular leaflet to the anterior leaflet   using #1 Vicryl.  The fascia of the   tensor fascia lata muscle was then reapproximated using #1 Vicryl and #0 Stratafix sutures.  The   remaining wound was closed with 2-0 Vicryl and running 4-0 Monocryl.   The hip was cleaned, dried, and dressed sterilely using Dermabond and   Aquacel dressing.  The patient was then brought   to recovery room in stable condition tolerating the procedure well.    Rosina Calin, PA-C was present for the entirety of the case involved from   preoperative positioning, perioperative  retractor management, general   facilitation of the case, as well as primary wound closure as assistant.  Donnice CORDOBA Ernie, M.D.        06/23/2024 4:52 PM

## 2024-06-23 NOTE — Discharge Instructions (Signed)

## 2024-06-23 NOTE — Consult Note (Addendum)
 Reason for Consult: left hip fracture Referring Physician: Madelyne, MD (Hospitalist)  Christina Mckinney is an 89 y.o. female.  HPI: Christina Mckinney is a 89 y.o. female with medical history significant of CAD, HFpEF, NSVT, DVT/PE, essential tremor, GERD, gout, macular degeneration, osteoarthritis, hyperlipidemia, hypertension, hypothyroidism, anemia, anxiety, depression, prediabetes, CKD stage IIIb, COPD, BPPV, and other medical comorbidities presenting with a chief complaint of left hip pain since after a fall on Saturday 8/9.  Patient states she has chronic dizziness and vision problems for which she has previously been evaluated by neurology and ophthalmology.  On Saturday, her 16-year-old granddaughter was holding her hand and helping her walk to her recliner so she could sit but due to vision problems, patient somehow missed sitting on the recliner and fell on the floor instead landing on her left hip.  She thinks she might have hit her head on the floor also but denies loss of consciousness.  Since after the fall, she has been able to walk using her walker but with a lot of difficulty due to severe pain in her left hip.  She reports chronic shortness of breath and denies any worsening shortness of breath in the past few days.  Denies cough or chest pain.  She reports chronic constipation but denies nausea, vomiting, or abdominal pain.  She does not take aspirin  or any other blood thinners at home.   Patient was seen by her PCP yesterday prior to ED arrival and had an x-ray done which showed impacted left femoral neck fracture.  Outpatient labs done at PCPs office notable for WBC count 11.4, BUN 31, creatinine 1.15.  Orthopedics consulted for management of left hip fracture  Past Medical History:  Diagnosis Date   Anemia, unspecified    Anxiety and depression 09/18/2015   Anxiety state, unspecified    Chest pain    Chronic systolic CHF (congestive heart failure) (HCC) 08/12/2015   Depression with  anxiety 06/02/2007   Qualifier: Diagnosis of  By: Georgian ROSALEA CHARM Lamar    Depressive disorder, not elsewhere classified    Diverticulitis    DVT (deep venous thrombosis) (HCC)    Dyspnea 07/22/2015   Edema 06/12/2014   Essential and other specified forms of tremor    GERD (gastroesophageal reflux disease)    Gout, unspecified    Internal hemorrhoids without mention of complication    Macular degeneration 08/07/2015   Osteoarthritis    Other abnormal glucose    Other and unspecified hyperlipidemia    Personal history of colonic polyps    Personal history of peptic ulcer disease    Pulmonary embolism (HCC)    Unspecified disorder of bladder    Unspecified essential hypertension    Unspecified hypothyroidism    Varicose veins     Past Surgical History:  Procedure Laterality Date   ABDOMINAL HYSTERECTOMY     CHOLECYSTECTOMY     ENDOVENOUS ABLATION SAPHENOUS VEIN W/ LASER  09-11-2012   left greater saphenous vein   by Krystal Doing MD   EYE SURGERY     KNEE ARTHROSCOPY     right    NASAL SEPTUM SURGERY     SHOULDER SURGERY     TOTAL KNEE ARTHROPLASTY  09/2013   Right    Family History  Problem Relation Age of Onset   Other Mother        varicose veins   Hip fracture Mother    Heart disease Father        CHF  Hyperlipidemia Father    Heart attack Father    Cancer Sister        stomach   Heart disease Sister    Heart disease Brother    Diabetes Daughter    Heart disease Daughter    Hyperlipidemia Daughter    Hypertension Daughter    Aneurysm Daughter    Colon cancer Neg Hx     Social History:  reports that she quit smoking about 35 years ago. Her smoking use included cigarettes. She started smoking about 75 years ago. She has never used smokeless tobacco. She reports current alcohol use of about 4.0 standard drinks of alcohol per week. She reports that she does not use drugs.  Allergies:  Allergies  Allergen Reactions   Simvastatin  Other (See Comments)    Hip pain    Crestor  [Rosuvastatin ]     Left hip pain   Lipitor [Atorvastatin ]     Pain in hips   Red Dye #40 (Allura Red)     Hip pain   Statins Other (See Comments)    Muscle aches, could not walk  Muscle aches, could not walk, Muscle aches, could not walk    Medications: I have reviewed the patient's current medications.  Results for orders placed or performed during the hospital encounter of 06/22/24 (from the past 24 hours)  CBC     Status: Abnormal   Collection Time: 06/23/24  6:11 AM  Result Value Ref Range   WBC 8.7 4.0 - 10.5 K/uL   RBC 3.69 (L) 3.87 - 5.11 MIL/uL   Hemoglobin 11.7 (L) 12.0 - 15.0 g/dL   HCT 63.3 63.9 - 53.9 %   MCV 99.2 80.0 - 100.0 fL   MCH 31.7 26.0 - 34.0 pg   MCHC 32.0 30.0 - 36.0 g/dL   RDW 86.1 88.4 - 84.4 %   Platelets 182 150 - 400 K/uL   nRBC 0.0 0.0 - 0.2 %     X-ray: EXAM: DG HIP (WITH OR WITHOUT PELVIS) 2-3V*L*   COMPARISON:  None Available.   FINDINGS: There is angulation of the left femoral neck with mild impaction compatible with impacted femoral neck fracture with slight valgus angulation. No subluxation or dislocation. Early degenerative changes in the hips.   IMPRESSION: Impacted left femoral neck fracture.     Electronically Signed   By: Franky Crease M.D.  ROS: AS per HPI  Blood pressure (!) 136/50, pulse 77, temperature 97.8 F (36.6 C), temperature source Oral, resp. rate 17, height 5' 1 (1.549 m), weight 62.7 kg, SpO2 92%.  Physical Exam: Constitutional:      General: She is not in acute distress. HENT:     Head: Normocephalic and atraumatic.  Eyes:     Extraocular Movements: Extraocular movements intact.  Cardiovascular:     Rate and Rhythm: Normal rate and regular rhythm.     Heart sounds: Normal heart sounds.  Pulmonary:     Effort: Pulmonary effort is normal. No respiratory distress.     Breath sounds: Normal breath sounds. No wheezing or rales.  Abdominal:     General: Bowel sounds are normal. There is no  distension.     Palpations: Abdomen is soft.     Tenderness: There is no abdominal tenderness. There is no guarding.  Musculoskeletal:     Cervical back: Normal range of motion.     Right lower leg: No edema.     Left lower leg: No edema.     Comments: Left lower  extremity neurovascularly intact. Slight shortening and mild external rotation, pain with movement  Skin:    General: Skin is warm and dry.  Neurological:     General: No focal deficit present.     Mental Status: She is alert and oriented to person, place, and time.   Assessment/Plan: Left hip femoral neck fracture  Plan: She will need operative management of this fracture in order to maintain her active lifestyle Will need to find OR time and likely help from colleague tomorrow  Orders will be placed accordingly Total hip versus hemiarthroplasty  [name] 06/23/2024, 6:55 AM

## 2024-06-23 NOTE — Transfer of Care (Signed)
 Immediate Anesthesia Transfer of Care Note  Patient: Christina Mckinney  Procedure(s) Performed: ARTHROPLASTY, HIP, TOTAL, ANTERIOR APPROACH (Left: Hip)  Patient Location: PACU  Anesthesia Type:Spinal  Level of Consciousness: awake  Airway & Oxygen  Therapy: Patient Spontanous Breathing and Patient connected to face mask  Post-op Assessment: Report given to RN and Post -op Vital signs reviewed and stable  Post vital signs: Reviewed and stable  Last Vitals:  Vitals Value Taken Time  BP 76/46 06/23/24 18:15  Temp    Pulse 70 06/23/24 18:17  Resp    SpO2 95 % 06/23/24 18:17  Vitals shown include unfiled device data.  Last Pain:  Vitals:   06/23/24 1601  TempSrc: Oral  PainSc: 3          Complications: No notable events documented.

## 2024-06-23 NOTE — Progress Notes (Signed)
 PROGRESS NOTE    Christina Mckinney  FMW:[id number] DOB: 03-17-1932 DOA: 06/22/2024 PCP: Domenica Harlene LABOR, MD   Brief Narrative: 89 year old with past medical history significant for CAD, heart failure preserved ejection fraction, known SVT, DVT/PE, essential tremor, GERD, gout, macular degeneration, osteoarthritis, hyperlipidemia, hypertension, hypothyroidism, anemia, anxiety, depression, prediabetes, CKD stage IIIb, COPD, BPPV who presented with left hip pain since after a mechanical fall that happened on Saturday/9.  Patient was going to sit in her recliner, but somehow she missed the recliner and fell on the floor instead landing on her left hip.  Since after the fall she was able to walk using a walker but has been having severe pain.  They report stable chronic shortness of breath.  X-ray obtained by PCP showed impacted left femoral neck fracture.   Assessment & Plan:   Principal Problem:   Hip fracture (HCC) Active Problems:   COPD (chronic obstructive pulmonary disease) (HCC)   Hypoxia   Fall at home, initial encounter   CAD (coronary artery disease)  1-Impacted left femoral neck fracture - Ortho consulted, plan for surgical intervention 8/13  Recent fall - CT head:  Mild hypoxia - Patient was not hypoxic initially on arrival to the ED but subsequently desatted to the 88 --92% on room air placed on 2 L. -Suspect  related to opioid, she received Percocet several hours prior -Chest X-ray: Increased pulmonary vascular congestion, no frank interstitial edema or pleural effusion.  Diffuse chronic bronchial wall thickening.  CAD: -  Chronic heart failure preserved ejection fraction -Resume lasix .   History of known SVT Hypertension - Continue metoprolol  -Hold Cozaar  pre op, also SBP soft.   GERD: - Continue Protonix   Hypothyroidism: - Continue Synthroid   COPD: Continue as needed DuoNeb  Estimated body mass index is 26.11 kg/m as calculated from the following:    Height as of this encounter: 5' 1 (1.549 m).   Weight as of this encounter: 62.7 kg.   DVT prophylaxis:  Code Status:  Family Communication: Disposition Plan:  Status is: Inpatient {Inpatient:23812}    Consultants:  ***  Procedures:  ***  Antimicrobials:    Subjective: ***  Objective: Vitals:   06/23/24 0100 06/23/24 0145 06/23/24 0200 06/23/24 0551  BP: 96/72 (!) 106/90  (!) 136/50  Pulse: 65 66  77  Resp: 16 17  17   Temp:   97.9 F (36.6 C) 97.8 F (36.6 C)  TempSrc:   Oral Oral  SpO2: 97% 97%  92%  Weight:      Height:       No intake or output data in the 24 hours ending 06/23/24 0752 Filed Weights   06/22/24 1748  Weight: 62.7 kg    Examination:  General exam: Appears calm and comfortable  Respiratory system: Clear to auscultation. Respiratory effort normal. Cardiovascular system: S1 & S2 heard, RRR. No JVD, murmurs, rubs, gallops or clicks. No pedal edema. Gastrointestinal system: Abdomen is nondistended, soft and nontender. No organomegaly or masses felt. Normal bowel sounds heard. Central nervous system: Alert and oriented. No focal neurological deficits. Extremities: Symmetric 5 x 5 power. Skin: No rashes, lesions or ulcers Psychiatry: Judgement and insight appear normal. Mood & affect appropriate.     Data Reviewed: I have personally reviewed following labs and imaging studies  CBC: Recent Labs  Lab 06/22/24 1225 06/23/24 0611  WBC 11.4* 8.7  NEUTROABS 9.1*  --   HGB 12.9 11.7*  HCT 39.5 36.6  MCV 96.4 99.2  PLT 226.0 182  Basic Metabolic Panel: Recent Labs  Lab 06/22/24 1225  NA 139  K 4.3  CL 99  CO2 28  GLUCOSE 100*  BUN 31*  CREATININE 1.15  CALCIUM  10.3   GFR: Estimated Creatinine Clearance: 27.6 mL/min (by C-G formula based on SCr of 1.15 mg/dL). Liver Function Tests: Recent Labs  Lab 06/22/24 1225  AST 22  ALT 7  ALKPHOS 79  BILITOT 1.2  PROT 7.0  ALBUMIN 4.3   No results for input(s): LIPASE,  AMYLASE in the last 168 hours. No results for input(s): AMMONIA in the last 168 hours. Coagulation Profile: No results for input(s): INR, PROTIME in the last 168 hours. Cardiac Enzymes: No results for input(s): CKTOTAL, CKMB, CKMBINDEX, TROPONINI in the last 168 hours. BNP (last 3 results) No results for input(s): PROBNP in the last 8760 hours. HbA1C: No results for input(s): HGBA1C in the last 72 hours. CBG: No results for input(s): GLUCAP in the last 168 hours. Lipid Profile: No results for input(s): CHOL, HDL, LDLCALC, TRIG, CHOLHDL, LDLDIRECT in the last 72 hours. Thyroid  Function Tests: Recent Labs    06/22/24 1225  TSH 3.09   Anemia Panel: Recent Labs    06/22/24 1225  VITAMINB12 554   Sepsis Labs: No results for input(s): PROCALCITON, LATICACIDVEN in the last 168 hours.  Recent Results (from the past 240 hours)  Surgical pcr screen     Status: None   Collection Time: 06/23/24  5:58 AM   Specimen: Nasal Mucosa; Nasal Swab  Result Value Ref Range Status   MRSA, PCR NEGATIVE NEGATIVE Final   Staphylococcus aureus NEGATIVE NEGATIVE Final    Comment: (NOTE) The Xpert SA Assay (FDA approved for NASAL specimens in patients 13 years of age and older), is one component of a comprehensive surveillance program. It is not intended to diagnose infection nor to guide or monitor treatment. Performed at [facility], [address], [place], [place] [address]          Radiology Studies: DG HIP UNILAT WITH PELVIS 2-3 VIEWS LEFT Result Date: 06/22/2024 CLINICAL DATA:  Fall, left hip pain EXAM: DG HIP (WITH OR WITHOUT PELVIS) 2-3V*L* COMPARISON:  None Available. FINDINGS: There is angulation of the left femoral neck with mild impaction compatible with impacted femoral neck fracture with slight valgus angulation. No subluxation or dislocation. Early degenerative changes in the hips. IMPRESSION: Impacted left femoral neck  fracture. Electronically Signed   By: Franky Crease M.D.   On: 06/22/2024 14:07        Scheduled Meds:  chlorhexidine   60 mL Topical Once   cycloSPORINE   1 drop Both Eyes BID   dexamethasone  (DECADRON ) injection  8 mg Intravenous Once   levothyroxine   50 mcg Oral QAC breakfast   metoprolol  succinate  100 mg Oral Daily   pantoprazole   40 mg Oral BID AC   povidone-iodine   2 Application Topical Once   senna  1 tablet Oral Daily   Continuous Infusions:  sodium chloride  75 mL/hr at 06/23/24 9347    ceFAZolin  (ANCEF ) IV     tranexamic acid        LOS: 0 days    Time spent: 35 minutes    Riann Oman A Knut Rondinelli, MD [facility]   If 7PM-7AM, please contact night-coverage [email or web address]  06/23/2024, 7:52 AM

## 2024-06-23 NOTE — H&P (Signed)
 History and Physical    Christina Mckinney FMW:[id number] DOB: 06-25-1932 DOA: 06/22/2024  PCP: Domenica Harlene LABOR, MD  Patient coming from: Home  Chief Complaint: Left hip pain  HPI: Christina Mckinney is a 89 y.o. female with medical history significant of CAD, HFpEF, NSVT, DVT/PE, essential tremor, GERD, gout, macular degeneration, osteoarthritis, hyperlipidemia, hypertension, hypothyroidism, anemia, anxiety, depression, prediabetes, CKD stage IIIb, COPD, BPPV, and other medical comorbidities presenting with a chief complaint of left hip pain since after a fall on Saturday 8/9.  Patient states she has chronic dizziness and vision problems for which she has previously been evaluated by neurology and ophthalmology.  On Saturday, her 20-year-old granddaughter was holding her hand and helping her walk to her recliner so she could sit but due to vision problems, patient somehow missed sitting on the recliner and fell on the floor instead landing on her left hip.  She thinks she might have hit her head on the floor also but denies loss of consciousness.  Since after the fall, she has been able to walk using her walker but with a lot of difficulty due to severe pain in her left hip.  She reports chronic shortness of breath and denies any worsening shortness of breath in the past few days.  Denies cough or chest pain.  She reports chronic constipation but denies nausea, vomiting, or abdominal pain.  She does not take aspirin  or any other blood thinners at home.  Patient was seen by her PCP yesterday prior to ED arrival and had an x-ray done which showed impacted left femoral neck fracture.  Outpatient labs done at PCPs office notable for WBC count 11.4, BUN 31, creatinine 1.15.  ED Course: No labs repeated in the ED as outpatient labs were done by PCP prior to ED arrival.  EKG showing sinus rhythm, RBBB, no acute ischemic changes in comparison to previous EKG.  Patient was given Percocet for pain.  Not hypoxic on  arrival but later desatted to 88-92% on room air and placed on 2 L .  EDP discussed the case with Dr. Ernie with orthopedics who will see the patient in the morning and decide on timing of surgery.  Review of Systems:  Review of Systems  All other systems reviewed and are negative.   Past Medical History:  Diagnosis Date   Anemia, unspecified    Anxiety and depression 09/18/2015   Anxiety state, unspecified    Chest pain    Chronic systolic CHF (congestive heart failure) (HCC) 08/12/2015   Depression with anxiety 06/02/2007   Qualifier: Diagnosis of  By: Georgian ROSALEA CHARM Lamar    Depressive disorder, not elsewhere classified    Diverticulitis    DVT (deep venous thrombosis) (HCC)    Dyspnea 07/22/2015   Edema 06/12/2014   Essential and other specified forms of tremor    GERD (gastroesophageal reflux disease)    Gout, unspecified    Internal hemorrhoids without mention of complication    Macular degeneration 08/07/2015   Osteoarthritis    Other abnormal glucose    Other and unspecified hyperlipidemia    Personal history of colonic polyps    Personal history of peptic ulcer disease    Pulmonary embolism (HCC)    Unspecified disorder of bladder    Unspecified essential hypertension    Unspecified hypothyroidism    Varicose veins     Past Surgical History:  Procedure Laterality Date   ABDOMINAL HYSTERECTOMY     CHOLECYSTECTOMY  ENDOVENOUS ABLATION SAPHENOUS VEIN W/ LASER  09-11-2012   left greater saphenous vein   by Krystal Doing MD   EYE SURGERY     KNEE ARTHROSCOPY     right    NASAL SEPTUM SURGERY     SHOULDER SURGERY     TOTAL KNEE ARTHROPLASTY  09/2013   Right     reports that she quit smoking about 35 years ago. Her smoking use included cigarettes. She started smoking about 75 years ago. She has never used smokeless tobacco. She reports current alcohol use of about 4.0 standard drinks of alcohol per week. She reports that she does not use drugs.  Allergies  Allergen  Reactions   Simvastatin  Other (See Comments)    Hip pain   Crestor  [Rosuvastatin ]     Left hip pain   Lipitor [Atorvastatin ]     Pain in hips   Red Dye #40 (Allura Red)     Hip pain   Statins Other (See Comments)    Muscle aches, could not walk  Muscle aches, could not walk, Muscle aches, could not walk    Family History  Problem Relation Age of Onset   Other Mother        varicose veins   Hip fracture Mother    Heart disease Father        CHF   Hyperlipidemia Father    Heart attack Father    Cancer Sister        stomach   Heart disease Sister    Heart disease Brother    Diabetes Daughter    Heart disease Daughter    Hyperlipidemia Daughter    Hypertension Daughter    Aneurysm Daughter    Colon cancer Neg Hx     Prior to Admission medications   Medication Sig Start Date End Date Taking? Authorizing Provider  erythromycin  ophthalmic ointment Place 1 Application into both eyes at bedtime. 06/03/24  Yes [provider]  febuxostat  (ULORIC ) 40 MG tablet Take 1 tablet (40 mg total) by mouth daily. 02/13/24  Yes Domenica Harlene LABOR, MD  furosemide  (LASIX ) 40 MG tablet Take 1 tablet (40 mg total) by mouth daily. 09/05/22  Yes O'Sullivan, Melissa, NP  losartan  (COZAAR ) 25 MG tablet Take 1 tablet (25 mg total) by mouth daily. 02/10/24  Yes Domenica Harlene LABOR, MD  metoprolol  succinate (TOPROL -XL) 100 MG 24 hr tablet Take 1 tablet (100 mg total) by mouth daily. Take with or immediately following a meal. 02/10/24  Yes Domenica Harlene LABOR, MD  Olopatadine  HCl (PATADAY  OP) Apply 1 drop to eye daily.   Yes [provider]  pantoprazole  (PROTONIX ) 40 MG tablet Take 1 tablet (40 mg total) by mouth 2 (two) times daily before a meal. 04/20/24  Yes Domenica Harlene LABOR, MD  SYNTHROID  50 MCG tablet Take 1 tablet (50 mcg total) by mouth daily before breakfast. 02/10/24  Yes Domenica Harlene LABOR, MD  VEVYE  0.1 % SOLN Place 1 drop into both eyes in the morning and at bedtime. 06/11/24  Yes [provider]  albuterol  (VENTOLIN  HFA) 108 (90 Base) MCG/ACT inhaler USE 2 INHALATIONS EVERY 6 HOURS AS NEEDED FOR WHEEZING OR SHORTNESS OF BREATH 08/28/21   Domenica Harlene LABOR, MD  aspirin  81 MG tablet Take 1 tablet (81 mg total) by mouth daily. Patient not taking: No sig reported 04/29/12   Georgian Tara SAUNDERS, DO    Physical Exam: Vitals:   06/23/24 0030 06/23/24 0100 06/23/24 0145 06/23/24 0200  BP: ROLLEN)  143/86 96/72 (!) 106/90   Pulse: 77 65 66   Resp: 15 16 17    Temp: 97.8 F (36.6 C)   97.9 F (36.6 C)  TempSrc:    Oral  SpO2: 99% 97% 97%   Weight:      Height:        Physical Exam Vitals reviewed.  Constitutional:      General: She is not in acute distress. HENT:     Head: Normocephalic and atraumatic.  Eyes:     Extraocular Movements: Extraocular movements intact.  Cardiovascular:     Rate and Rhythm: Normal rate and regular rhythm.     Heart sounds: Normal heart sounds.  Pulmonary:     Effort: Pulmonary effort is normal. No respiratory distress.     Breath sounds: Normal breath sounds. No wheezing or rales.  Abdominal:     General: Bowel sounds are normal. There is no distension.     Palpations: Abdomen is soft.     Tenderness: There is no abdominal tenderness. There is no guarding.  Musculoskeletal:     Cervical back: Normal range of motion.     Right lower leg: No edema.     Left lower leg: No edema.     Comments: Left lower extremity neurovascularly intact  Skin:    General: Skin is warm and dry.  Neurological:     General: No focal deficit present.     Mental Status: She is alert and oriented to person, place, and time.     Labs on Admission: I have personally reviewed following labs and imaging studies  CBC: Recent Labs  Lab 06/22/24 1225  WBC 11.4*  NEUTROABS 9.1*  HGB 12.9  HCT 39.5  MCV 96.4  PLT 226.0   Basic Metabolic Panel: Recent Labs  Lab 06/22/24 1225  NA 139  K 4.3  CL 99  CO2 28  GLUCOSE 100*  BUN 31*  CREATININE 1.15   CALCIUM  10.3   GFR: Estimated Creatinine Clearance: 27.6 mL/min (by C-G formula based on SCr of 1.15 mg/dL). Liver Function Tests: Recent Labs  Lab 06/22/24 1225  AST 22  ALT 7  ALKPHOS 79  BILITOT 1.2  PROT 7.0  ALBUMIN 4.3   No results for input(s): LIPASE, AMYLASE in the last 168 hours. No results for input(s): AMMONIA in the last 168 hours. Coagulation Profile: No results for input(s): INR, PROTIME in the last 168 hours. Cardiac Enzymes: No results for input(s): CKTOTAL, CKMB, CKMBINDEX, TROPONINI in the last 168 hours. BNP (last 3 results) No results for input(s): PROBNP in the last 8760 hours. HbA1C: No results for input(s): HGBA1C in the last 72 hours. CBG: No results for input(s): GLUCAP in the last 168 hours. Lipid Profile: No results for input(s): CHOL, HDL, LDLCALC, TRIG, CHOLHDL, LDLDIRECT in the last 72 hours. Thyroid  Function Tests: Recent Labs    06/22/24 1225  TSH 3.09   Anemia Panel: Recent Labs    06/22/24 1225  VITAMINB12 554   Urine analysis:    Component Value Date/Time   COLORURINE YELLOW 02/10/2024 1216   APPEARANCEUR Sl Cloudy (A) 02/10/2024 1216   LABSPEC 1.015 02/10/2024 1216   PHURINE 6.0 02/10/2024 1216   GLUCOSEU NEGATIVE 02/10/2024 1216   HGBUR NEGATIVE 02/10/2024 1216   HGBUR large 11/07/2010 1334   BILIRUBINUR negative 06/22/2024 1222   BILIRUBINUR negative 06/12/2014 1117   KETONESUR negative 06/22/2024 1222   KETONESUR NEGATIVE 02/10/2024 1216   PROTEINUR negative 06/22/2024 1222   PROTEINUR negative  06/12/2014 1117   PROTEINUR NEGATIVE 10/01/2012 1825   UROBILINOGEN 0.2 06/22/2024 1222   UROBILINOGEN 0.2 02/10/2024 1216   NITRITE Negative 06/22/2024 1222   NITRITE NEGATIVE 02/10/2024 1216   LEUKOCYTESUR Negative 06/22/2024 1222   LEUKOCYTESUR SMALL (A) 02/10/2024 1216    Radiological Exams on Admission: DG HIP UNILAT WITH PELVIS 2-3 VIEWS LEFT Result Date: 06/22/2024 CLINICAL  DATA:  Fall, left hip pain EXAM: DG HIP (WITH OR WITHOUT PELVIS) 2-3V*L* COMPARISON:  None Available. FINDINGS: There is angulation of the left femoral neck with mild impaction compatible with impacted femoral neck fracture with slight valgus angulation. No subluxation or dislocation. Early degenerative changes in the hips. IMPRESSION: Impacted left femoral neck fracture. Electronically Signed   By: Franky Crease M.D.   On: 06/22/2024 14:07    Assessment and Plan  Impacted left femoral neck fracture Secondary to a mechanical fall 3 days ago.  Orthopedics consulted and will see the patient in the morning and decide on timing of surgery.  Keep n.p.o., IV fluid hydration, and continue pain management.  Placed on bowel regimen for constipation while on opiates for pain.  Recent fall Stat CT head ordered as patient reports hitting her head on the floor.  Mild hypoxia Patient was not hypoxic on arrival to the ED but later desatted to 88-92% on room air and was placed on 2 L [place].  She had received a dose of Percocet several hours prior but no IV opiates.  Lungs clear on exam.  Stat chest x-ray ordered.  CAD Not endorsing chest pain and EKG without acute ischemic changes.  Chronic HFpEF No signs of volume overload.  Hypertension History of NSVT Continue metoprolol .  GERD Continue Protonix .  Hypothyroidism Continue Synthroid .  COPD Stable, no signs of acute exacerbation.  DuoNeb PRN.  DVT prophylaxis: SCDs Code Status: Full Code (discussed with the patient) Family Communication: No family available at this time. Consults called: Orthopedics Level of care: Telemetry bed Admission status: It is my clinical opinion that admission to INPATIENT is reasonable and necessary because of the expectation that this patient will require hospital care that crosses at least 2 midnights to treat this condition based on the medical complexity of the problems presented.  Given the aforementioned  information, the predictability of an adverse outcome is felt to be significant.  Editha Ram MD [facility]  If 7PM-7AM, please contact night-coverage [email or web address]  06/23/2024, 4:39 AM

## 2024-06-23 NOTE — Anesthesia Preprocedure Evaluation (Addendum)
 Anesthesia Evaluation  Patient identified by MRN, date of birth, ID band Patient awake    Reviewed: Allergy & Precautions, NPO status , Patient's Chart, lab work & pertinent test results  Airway Mallampati: II  TM Distance: >3 FB Neck ROM: Full    Dental  (+) Dental Advisory Given   Pulmonary shortness of breath and with exertion, sleep apnea , COPD,  COPD inhaler, former smoker   breath sounds clear to auscultation       Cardiovascular hypertension, Pt. on medications + CAD, + Peripheral Vascular Disease, +CHF and + DVT   Rhythm:Regular Rate:Normal     Neuro/Psych  Neuromuscular disease    GI/Hepatic Neg liver ROS,GERD  ,,  Endo/Other  diabetes, Type 2, Oral Hypoglycemic AgentsHypothyroidism    Renal/GU Renal disease     Musculoskeletal  (+) Arthritis ,    Abdominal   Peds  Hematology  (+) Blood dyscrasia, anemia   Anesthesia Other Findings   Reproductive/Obstetrics                              Anesthesia Physical Anesthesia Plan  ASA: 3  Anesthesia Plan: Spinal   Post-op Pain Management: Ofirmev  IV (intra-op)*   Induction:   PONV Risk Score and Plan: 2 and Dexamethasone , Ondansetron , Treatment may vary due to age or medical condition and Propofol  infusion  Airway Management Planned: Natural Airway and Simple Face Mask  Additional Equipment:   Intra-op Plan:   Post-operative Plan:   Informed Consent: I have reviewed the patients History and Physical, chart, labs and discussed the procedure including the risks, benefits and alternatives for the proposed anesthesia with the patient or authorized representative who has indicated his/her understanding and acceptance.       Plan Discussed with:   Anesthesia Plan Comments:          Anesthesia Quick Evaluation

## 2024-06-23 NOTE — Consult Note (Addendum)
 Reason for Consult: left hip fracture Referring Physician: Madelyne, MD (Hospitalist)  Christina Mckinney is an 89 y.o. female.  HPI: Christina Mckinney is a 89 y.o. female with medical history significant of CAD, HFpEF, NSVT, DVT/PE, essential tremor, GERD, gout, macular degeneration, osteoarthritis, hyperlipidemia, hypertension, hypothyroidism, anemia, anxiety, depression, prediabetes, CKD stage IIIb, COPD, BPPV, and other medical comorbidities presenting with a chief complaint of left hip pain since after a fall on Saturday 8/9.  Patient states she has chronic dizziness and vision problems for which she has previously been evaluated by neurology and ophthalmology.  On Saturday, her 52-year-old granddaughter was holding her hand and helping her walk to her recliner so she could sit but due to vision problems, patient somehow missed sitting on the recliner and fell on the floor instead landing on her left hip.  She thinks she might have hit her head on the floor also but denies loss of consciousness.  Since after the fall, she has been able to walk using her walker but with a lot of difficulty due to severe pain in her left hip.  She reports chronic shortness of breath and denies any worsening shortness of breath in the past few days.  Denies cough or chest pain.  She reports chronic constipation but denies nausea, vomiting, or abdominal pain.  She does not take aspirin  or any other blood thinners at home.   Patient was seen by her PCP yesterday prior to ED arrival and had an x-ray done which showed impacted left femoral neck fracture.  Outpatient labs done at PCPs office notable for WBC count 11.4, BUN 31, creatinine 1.15.  Orthopedics consulted for management of left hip fracture  Past Medical History:  Diagnosis Date   Anemia, unspecified    Anxiety and depression 09/18/2015   Anxiety state, unspecified    Chest pain    Chronic systolic CHF (congestive heart failure) (HCC) 08/12/2015   Depression with  anxiety 06/02/2007   Qualifier: Diagnosis of  By: Georgian ROSALEA CHARM Lamar    Depressive disorder, not elsewhere classified    Diverticulitis    DVT (deep venous thrombosis) (HCC)    Dyspnea 07/22/2015   Edema 06/12/2014   Essential and other specified forms of tremor    GERD (gastroesophageal reflux disease)    Gout, unspecified    Internal hemorrhoids without mention of complication    Macular degeneration 08/07/2015   Osteoarthritis    Other abnormal glucose    Other and unspecified hyperlipidemia    Personal history of colonic polyps    Personal history of peptic ulcer disease    Pulmonary embolism (HCC)    Unspecified disorder of bladder    Unspecified essential hypertension    Unspecified hypothyroidism    Varicose veins     Past Surgical History:  Procedure Laterality Date   ABDOMINAL HYSTERECTOMY     CHOLECYSTECTOMY     ENDOVENOUS ABLATION SAPHENOUS VEIN W/ LASER  09-11-2012   left greater saphenous vein   by Krystal Doing MD   EYE SURGERY     KNEE ARTHROSCOPY     right    NASAL SEPTUM SURGERY     SHOULDER SURGERY     TOTAL KNEE ARTHROPLASTY  09/2013   Right    Family History  Problem Relation Age of Onset   Other Mother        varicose veins   Hip fracture Mother    Heart disease Father        CHF  Hyperlipidemia Father    Heart attack Father    Cancer Sister        stomach   Heart disease Sister    Heart disease Brother    Diabetes Daughter    Heart disease Daughter    Hyperlipidemia Daughter    Hypertension Daughter    Aneurysm Daughter    Colon cancer Neg Hx     Social History:  reports that she quit smoking about 35 years ago. Her smoking use included cigarettes. She started smoking about 75 years ago. She has never used smokeless tobacco. She reports current alcohol use of about 4.0 standard drinks of alcohol per week. She reports that she does not use drugs.  Allergies:  Allergies  Allergen Reactions   Simvastatin  Other (See Comments)    Hip pain    Crestor  [Rosuvastatin ]     Left hip pain   Lipitor [Atorvastatin ]     Pain in hips   Red Dye #40 (Allura Red)     Hip pain   Statins Other (See Comments)    Muscle aches, could not walk  Muscle aches, could not walk, Muscle aches, could not walk    Medications: I have reviewed the patient's current medications.  Results for orders placed or performed during the hospital encounter of 06/22/24 (from the past 24 hours)  CBC     Status: Abnormal   Collection Time: 06/23/24  6:11 AM  Result Value Ref Range   WBC 8.7 4.0 - 10.5 K/uL   RBC 3.69 (L) 3.87 - 5.11 MIL/uL   Hemoglobin 11.7 (L) 12.0 - 15.0 g/dL   HCT 63.3 63.9 - 53.9 %   MCV 99.2 80.0 - 100.0 fL   MCH 31.7 26.0 - 34.0 pg   MCHC 32.0 30.0 - 36.0 g/dL   RDW 86.1 88.4 - 84.4 %   Platelets 182 150 - 400 K/uL   nRBC 0.0 0.0 - 0.2 %     X-ray: EXAM: DG HIP (WITH OR WITHOUT PELVIS) 2-3V*L*   COMPARISON:  None Available.   FINDINGS: There is angulation of the left femoral neck with mild impaction compatible with impacted femoral neck fracture with slight valgus angulation. No subluxation or dislocation. Early degenerative changes in the hips.   IMPRESSION: Impacted left femoral neck fracture.     Electronically Signed   By: Franky Crease M.D.  ROS: AS per HPI  Blood pressure (!) 136/50, pulse 77, temperature 97.8 F (36.6 C), temperature source Oral, resp. rate 17, height 5' 1 (1.549 m), weight 62.7 kg, SpO2 92%.  Physical Exam: Constitutional:      General: She is not in acute distress. HENT:     Head: Normocephalic and atraumatic.  Eyes:     Extraocular Movements: Extraocular movements intact.  Cardiovascular:     Rate and Rhythm: Normal rate and regular rhythm.     Heart sounds: Normal heart sounds.  Pulmonary:     Effort: Pulmonary effort is normal. No respiratory distress.     Breath sounds: Normal breath sounds. No wheezing or rales.  Abdominal:     General: Bowel sounds are normal. There is no  distension.     Palpations: Abdomen is soft.     Tenderness: There is no abdominal tenderness. There is no guarding.  Musculoskeletal:     Cervical back: Normal range of motion.     Right lower leg: No edema.     Left lower leg: No edema.     Comments: Left lower  extremity neurovascularly intact. Slight shortening and mild external rotation, pain with movement  Skin:    General: Skin is warm and dry.  Neurological:     General: No focal deficit present.     Mental Status: She is alert and oriented to person, place, and time.   Assessment/Plan: Left hip femoral neck fracture  Plan: She will need operative management of this fracture in order to maintain her active lifestyle Will need to find OR time and likely help from colleague tomorrow  Orders will be placed accordingly Total hip versus hemiarthroplasty  [name] 06/23/2024, 6:55 AM

## 2024-06-23 NOTE — Interval H&P Note (Signed)
 History and Physical Interval Note:  06/23/2024 3:45 PM  Christina Mckinney  has presented today for surgery, with the diagnosis of LEFT FEMORAL NECK FRACTURE.  The various methods of treatment have been discussed with the patient and family. After consideration of risks, benefits and other options for treatment, the patient has consented to  Procedure(s): ARTHROPLASTY, HIP, TOTAL, ANTERIOR APPROACH (Left) as a surgical intervention.  The patient's history has been reviewed, patient examined, no change in status, stable for surgery.  I have reviewed the patient's chart and labs.  Questions were answered to the patient's satisfaction.     [name]

## 2024-06-23 NOTE — TOC Initial Note (Signed)
 Transition of Care [facility]) - Initial/Assessment Note    Patient Details  Name: Christina Mckinney MRN: [id number] Date of Birth: 22-Sep-1932  Transition of Care [facility]) CM/SW Contact:    Alfonse JONELLE Rex, RN Phone Number: 06/23/2024, 10:49 AM  Clinical Narrative:   Patient scheduled for surgery today, PT eva eval post operative, will follow for recommendation.                       Patient Goals and CMS Choice            Expected Discharge Plan and Services                                              Prior Living Arrangements/Services                       Activities of Daily Living   ADL Screening (condition at time of admission) Independently performs ADLs?: Yes (appropriate for developmental age) Is the patient deaf or have difficulty hearing?: Yes Does the patient have difficulty seeing, even when wearing glasses/contacts?: No Does the patient have difficulty concentrating, remembering, or making decisions?: No  Permission Sought/Granted                  Emotional Assessment              Admission diagnosis:  Hip fracture (HCC) [S72.009A] Left hip pain [M25.552] Fall, initial encounter [W19.XXXA] Closed fracture of neck of left femur, initial encounter (HCC) [S72.002A] Patient Active Problem List   Diagnosis Date Noted   Hip fracture (HCC) 06/23/2024   Hypoxia 06/23/2024   Fall at home, initial encounter 06/23/2024   CAD (coronary artery disease) 06/23/2024   Foul smelling urine 06/22/2024   Hypothyroid 08/11/2021   Myalgia due to statin 08/11/2021   PVD (peripheral vascular disease) (HCC) 08/10/2021   Mass of left axilla 04/25/2021   Acute bilateral low back pain without sciatica 04/25/2021   Palpitations 09/14/2020   CRI (chronic renal insufficiency) 03/03/2019   High vitamin D  level 01/25/2019   Hypercalcemia 01/25/2019   Hyperparathyroidism (HCC) 01/25/2019   Ganglion cyst 09/07/2018   Hoarseness of voice 09/07/2018    Sleep apnea 09/04/2018   Other fatigue 12/04/2016   Anxiety and depression 09/18/2015   Hypertensive heart disease 08/12/2015   Chronic systolic CHF (congestive heart failure) (HCC) 08/12/2015   Macular degeneration 08/07/2015   Diminished pulses in lower extremity 11/01/2014   Left hip pain 07/12/2014   Dermatitis 06/15/2013   Chest pain 04/13/2013   Iliotibial band syndrome of left side 03/12/2013   Varicose veins of bilateral lower extremities with other complications 05/07/2012   Carotid stenosis 04/29/2012   Pulmonary nodule 04/18/2012   Constipation 04/18/2012   Right knee pain 11/09/2011   COPD (chronic obstructive pulmonary disease) (HCC) 10/12/2011   Chronic insomnia 07/06/2011   Disorder of bladder 11/07/2010   Anemia 10/19/2010   BENIGN POSITIONAL VERTIGO 10/19/2010   Bilateral carotid artery disease (HCC) 05/09/2010   Peripheral neuropathy 01/19/2010   Pain in joint, lower leg 08/01/2009   FLANK PAIN, RIGHT 07/26/2009   Internal hemorrhoids 06/09/2009   RESTLESS LEG SYNDROME 05/24/2009   Subjective visual disturbance 04/28/2009   HOT FLASHES 04/28/2009   Diabetes mellitus without complication (HCC) 02/28/2009   TREMOR, ESSENTIAL 10/29/2008   Gout 04/01/2008  Hyperlipidemia 06/02/2007   Primary hypertension 06/02/2007   GERD 06/02/2007   Diverticulosis of colon 06/02/2007   HX, PERSONAL, PEPTIC ULCER DISEASE 06/02/2007   History of colonic polyps 06/02/2007   DIVERTICULITIS, HX OF 06/02/2007   Osteoarthritis 06/02/2007   PCP:  Domenica Harlene LABOR, MD Pharmacy:   [facility] - [place], [place] - [address] [place] [address] Phone: [phone] Fax: [phone]  [facility] - [place], [place] - [address] [place] [address] Phone: [phone] Fax: [phone]  [id number] [place], [place] - [address] [place] [address] Phone: [phone] Fax: [phone]     Social Drivers of Health (SDOH) Social History: SDOH Screenings   Food Insecurity: No Food Insecurity (06/23/2024)  Housing: Low Risk  (06/23/2024)  Transportation Needs: No Transportation Needs (06/23/2024)  Utilities: Not At Risk (06/23/2024)  Depression (PHQ2-9): Low Risk  (06/11/2023)  Social Connections: Socially Isolated (06/23/2024)  Tobacco Use: Medium Risk (06/22/2024)   SDOH Interventions:     Readmission Risk Interventions     No data to display

## 2024-06-23 NOTE — Plan of Care (Signed)
Care plan initiated and reviewed.

## 2024-06-23 NOTE — Anesthesia Procedure Notes (Signed)
 Spinal  Patient location during procedure: OR Reason for block: surgical anesthesia Staffing Performed: anesthesiologist  Anesthesiologist: Epifanio Charleston, MD Performed by: Epifanio Charleston, MD Authorized by: Epifanio Charleston, MD   Preanesthetic Checklist Completed: patient identified, IV checked, site marked, risks and benefits discussed, surgical consent, monitors and equipment checked, pre-op evaluation and timeout performed Spinal Block Patient position: left lateral decubitus Prep: DuraPrep Patient monitoring: heart rate, cardiac monitor, continuous pulse ox and blood pressure Approach: midline Location: L4-5 Injection technique: single-shot Needle Needle type: Pencan  Needle gauge: 24 G Needle length: 9 cm Assessment Sensory level: T4 Events: CSF return

## 2024-06-23 NOTE — ED Notes (Signed)
 Pt O2 sats maintained between 88%-92%. Pt placed on 2L [place]. Pt sats now at 98%.

## 2024-06-23 NOTE — Progress Notes (Signed)
 Initial Nutrition Assessment  DOCUMENTATION CODES:   Not applicable  INTERVENTION:  - Recommend Regular diet after OR today to avoid restricting intake. - Ensure Plus High Protein po BID, each supplement provides 350 kcal and 20 grams of protein. - Encourage intake at all meals and of supplements.  - Monitor weight trends.   NUTRITION DIAGNOSIS:   Increased nutrient needs related to hip fracture as evidenced by estimated needs.  GOAL:   Patient will meet greater than or equal to 90% of their needs  MONITOR:   PO intake, Supplement acceptance, Weight trends  REASON FOR ASSESSMENT:   Consult Hip fracture protocol  ASSESSMENT:   89 y.o. female with PMH significant of CAD, HFpEF, NSVT, DVT/PE, essential tremor, GERD, gout, macular degeneration, osteoarthritis, HLD, HTN, hypothyroidism, anemia, anxiety, depression, prediabetes, CKD stage IIIb, COPD who presented with left hip pain after a fall. Admitted for left femoral neck fracture.  Patient reports a UBW of 138# and feels her weight has been stable for at least the last several months.  Per EMR, patient weighed at 150# in August 2024 and lost to 140# by March. However, this is not a significant weight loss and weight stable since March.  Patient endorses eating lunch and dinner at home, skips breakfast. Admits her appetite has decreased over the past few years but notes she expected this with age and doing less activity during the day.   Patient currently nauseous during visit, RN providing antiemetics. Dicussed increased kcal and protein needs to promote healign and patient endorsed understanding. She is agreeable to try Ensure to support intake.    Medications reviewed and include: Protonix , Senokot  Labs reviewed:  - HA1C 5.8 (as of 3/31)  NUTRITION - FOCUSED PHYSICAL EXAM:  Flowsheet Row Most Recent Value  Orbital Region Mild depletion  Upper Arm Region No depletion  Thoracic and Lumbar Region No depletion   Buccal Region No depletion  Temple Region Moderate depletion  Clavicle Bone Region Mild depletion  Clavicle and Acromion Bone Region Mild depletion  Scapular Bone Region Unable to assess  Dorsal Hand Mild depletion  Patellar Region No depletion  Anterior Thigh Region No depletion  Posterior Calf Region No depletion  Edema (RD Assessment) None  Hair Reviewed  Eyes Reviewed  Mouth Reviewed  Skin Reviewed  Nails Reviewed    Diet Order:   Diet Order             Diet NPO time specified  Diet effective ____           Diet NPO time specified Except for: Sips with Meds  Diet effective midnight           Diet NPO time specified  Diet effective now                   EDUCATION NEEDS:  Education needs have been addressed  Skin:  Skin Assessment: Reviewed RN Assessment  Last BM:  8/11  Height:  Ht Readings from Last 1 Encounters:  06/22/24 5' 1 (1.549 m)   Weight:  Wt Readings from Last 1 Encounters:  06/22/24 62.7 kg    BMI:  Body mass index is 26.11 kg/m.  Estimated Nutritional Needs:  Kcal:  1550-1750 kcals Protein:  75-90 grams Fluid:  >/= 1.6L    Trude Ned RD, LDN Contact via Secure Chat.

## 2024-06-24 ENCOUNTER — Encounter (HOSPITAL_COMMUNITY): Payer: Self-pay | Admitting: Orthopedic Surgery

## 2024-06-24 DIAGNOSIS — R0902 Hypoxemia: Secondary | ICD-10-CM | POA: Diagnosis not present

## 2024-06-24 DIAGNOSIS — I2585 Chronic coronary microvascular dysfunction: Secondary | ICD-10-CM | POA: Diagnosis not present

## 2024-06-24 DIAGNOSIS — J449 Chronic obstructive pulmonary disease, unspecified: Secondary | ICD-10-CM

## 2024-06-24 DIAGNOSIS — Y92009 Unspecified place in unspecified non-institutional (private) residence as the place of occurrence of the external cause: Secondary | ICD-10-CM

## 2024-06-24 DIAGNOSIS — W19XXXA Unspecified fall, initial encounter: Secondary | ICD-10-CM

## 2024-06-24 DIAGNOSIS — I5032 Chronic diastolic (congestive) heart failure: Secondary | ICD-10-CM

## 2024-06-24 DIAGNOSIS — S72002P Fracture of unspecified part of neck of left femur, subsequent encounter for closed fracture with malunion: Secondary | ICD-10-CM | POA: Diagnosis not present

## 2024-06-24 DIAGNOSIS — I10 Essential (primary) hypertension: Secondary | ICD-10-CM

## 2024-06-24 DIAGNOSIS — I471 Supraventricular tachycardia, unspecified: Secondary | ICD-10-CM

## 2024-06-24 DIAGNOSIS — K219 Gastro-esophageal reflux disease without esophagitis: Secondary | ICD-10-CM

## 2024-06-24 LAB — CBC
HCT: 31.9 % — ABNORMAL LOW (ref 36.0–46.0)
Hemoglobin: 10.1 g/dL — ABNORMAL LOW (ref 12.0–15.0)
MCH: 31.8 pg (ref 26.0–34.0)
MCHC: 31.7 g/dL (ref 30.0–36.0)
MCV: 100.3 fL — ABNORMAL HIGH (ref 80.0–100.0)
Platelets: 169 K/uL (ref 150–400)
RBC: 3.18 MIL/uL — ABNORMAL LOW (ref 3.87–5.11)
RDW: 13.8 % (ref 11.5–15.5)
WBC: 10.3 K/uL (ref 4.0–10.5)
nRBC: 0 % (ref 0.0–0.2)

## 2024-06-24 LAB — BASIC METABOLIC PANEL WITH GFR
Anion gap: 8 (ref 5–15)
BUN: 36 mg/dL — ABNORMAL HIGH (ref 8–23)
CO2: 21 mmol/L — ABNORMAL LOW (ref 22–32)
Calcium: 9.1 mg/dL (ref 8.9–10.3)
Chloride: 108 mmol/L (ref 98–111)
Creatinine, Ser: 1.43 mg/dL — ABNORMAL HIGH (ref 0.44–1.00)
GFR, Estimated: 35 mL/min — ABNORMAL LOW (ref >60)
Glucose, Bld: 227 mg/dL — ABNORMAL HIGH (ref 70–99)
Potassium: 4.2 mmol/L (ref 3.5–5.1)
Sodium: 137 mmol/L (ref 135–145)

## 2024-06-24 MED ORDER — ERYTHROMYCIN 5 MG/GM OP OINT
1.0000 | TOPICAL_OINTMENT | Freq: Every day | OPHTHALMIC | Status: DC
  Administered 2024-06-24 – 2024-06-28 (×6): 1 via OPHTHALMIC
  Filled 2024-06-24: qty 1
  Filled 2024-06-24: qty 3.5

## 2024-06-24 MED ORDER — SODIUM CHLORIDE 0.9 % IV BOLUS
250.0000 mL | Freq: Once | INTRAVENOUS | Status: AC
  Administered 2024-06-24 (×2): 250 mL via INTRAVENOUS

## 2024-06-24 MED ORDER — FEBUXOSTAT 40 MG PO TABS
40.0000 mg | ORAL_TABLET | Freq: Every day | ORAL | Status: DC
  Administered 2024-06-24 – 2024-06-29 (×7): 40 mg via ORAL
  Filled 2024-06-24 (×6): qty 1

## 2024-06-24 NOTE — NC FL2 (Signed)
 [place]  MEDICAID FL2 LEVEL OF CARE FORM     IDENTIFICATION  Patient Name: Christina Mckinney Birthdate: [birth year] Sex: female Admission Date (Current Location): 06/22/2024  [facility] and [place] Number:  Producer, television/film/video and Address:  [facility],  [address] [place], [place] [address]      Provider Number: [id number]  Attending Physician Name and Address:  Sebastian Toribio GAILS, MD  Relative Name and Phone Number:  Jaia, Alonge [facility])  [phone] (Mobile)    Current Level of Care: Hospital Recommended Level of Care: Skilled Nursing Facility Prior Approval Number:    Date Approved/Denied:   PASRR Number: [id number]  Discharge Plan: SNF    Current Diagnoses: Patient Active Problem List   Diagnosis Date Noted   Hip fracture (HCC) 06/23/2024   Hypoxia 06/23/2024   Fall at home, initial encounter 06/23/2024   CAD (coronary artery disease) 06/23/2024   S/P total left hip arthroplasty 06/23/2024   Foul smelling urine 06/22/2024   Hypothyroid 08/11/2021   Myalgia due to statin 08/11/2021   PVD (peripheral vascular disease) (HCC) 08/10/2021   Mass of left axilla 04/25/2021   Acute bilateral low back pain without sciatica 04/25/2021   Palpitations 09/14/2020   CRI (chronic renal insufficiency) 03/03/2019   High vitamin D  level 01/25/2019   Hypercalcemia 01/25/2019   Hyperparathyroidism (HCC) 01/25/2019   Ganglion cyst 09/07/2018   Hoarseness of voice 09/07/2018   Sleep apnea 09/04/2018   Other fatigue 12/04/2016   Anxiety and depression 09/18/2015   Hypertensive heart disease 08/12/2015   Chronic systolic CHF (congestive heart failure) (HCC) 08/12/2015   Macular degeneration 08/07/2015   Diminished pulses in lower extremity 11/01/2014   Left hip pain 07/12/2014   Dermatitis 06/15/2013   Chest pain 04/13/2013   Iliotibial band syndrome of left side 03/12/2013   Varicose veins of bilateral lower extremities with other complications 05/07/2012   Carotid  stenosis 04/29/2012   Pulmonary nodule 04/18/2012   Constipation 04/18/2012   Right knee pain 11/09/2011   COPD (chronic obstructive pulmonary disease) (HCC) 10/12/2011   Chronic insomnia 07/06/2011   Disorder of bladder 11/07/2010   Anemia 10/19/2010   BENIGN POSITIONAL VERTIGO 10/19/2010   Bilateral carotid artery disease (HCC) 05/09/2010   Peripheral neuropathy 01/19/2010   Pain in joint, lower leg 08/01/2009   FLANK PAIN, RIGHT 07/26/2009   Internal hemorrhoids 06/09/2009   RESTLESS LEG SYNDROME 05/24/2009   Subjective visual disturbance 04/28/2009   HOT FLASHES 04/28/2009   Diabetes mellitus without complication (HCC) 02/28/2009   TREMOR, ESSENTIAL 10/29/2008   Gout 04/01/2008   Hyperlipidemia 06/02/2007   Primary hypertension 06/02/2007   GERD 06/02/2007   Diverticulosis of colon 06/02/2007   HX, PERSONAL, PEPTIC ULCER DISEASE 06/02/2007   History of colonic polyps 06/02/2007   DIVERTICULITIS, HX OF 06/02/2007   Osteoarthritis 06/02/2007    Orientation RESPIRATION BLADDER Height & Weight     Self, Time, Situation, Place  Normal Continent, External catheter Weight: 62.7 kg Height:  5' 1 (154.9 cm)  BEHAVIORAL SYMPTOMS/MOOD NEUROLOGICAL BOWEL NUTRITION STATUS      Continent Diet (regular)  AMBULATORY STATUS COMMUNICATION OF NEEDS Skin   Limited Assist Verbally Surgical wounds (L Total Hip Arthroplasty)                       Personal Care Assistance Level of Assistance  Bathing, Feeding, Dressing Bathing Assistance: Limited assistance   Dressing Assistance: Limited assistance     Functional Limitations Info  Sight, Hearing,  Speech Sight Info: Adequate Hearing Info: Impaired (Hard of Hearing) Speech Info: Adequate    SPECIAL CARE FACTORS FREQUENCY  PT (By licensed PT), OT (By licensed OT)     PT Frequency: 5x/wk OT Frequency: 5x/wk            Contractures Contractures Info: Not present    Additional Factors Info  Code Status, Allergies,  Psychotropic Code Status Info: Full Code Allergies Info: Simvastatin , Crestor  (Rosuvastatin ), Lipitor (Atorvastatin ), Red Dye #40 (Allura Red), Statins Psychotropic Info: N/A         Current Medications (06/24/2024):  This is the current hospital active medication list Current Facility-Administered Medications  Medication Dose Route Frequency Provider Last Rate Last Admin   0.9 %  sodium chloride  infusion   Intravenous Continuous Patti Rosina SAUNDERS, PA-C 75 mL/hr at 06/24/24 9082 Infusion Verify at 06/24/24 0917   acetaminophen  (TYLENOL ) tablet 1,000 mg  1,000 mg Oral Q6H Donovan, Ashley R, PA-C   1,000 mg at 06/24/24 1057   alum & mag hydroxide-simeth (MAALOX/MYLANTA) 200-200-20 MG/5ML suspension 30 mL  30 mL Oral Q4H PRN Patti Rosina SAUNDERS, PA-C       aspirin  chewable tablet 81 mg  81 mg Oral BID Donovan, Ashley R, PA-C   81 mg at 06/24/24 9071   bisacodyl  (DULCOLAX) suppository 10 mg  10 mg Rectal Daily PRN Patti Rosina SAUNDERS, PA-C       cycloSPORINE  (RESTASIS ) 0.05 % ophthalmic emulsion 1 drop  1 drop Both Eyes BID Patti Rosina SAUNDERS, PA-C   1 drop at 06/24/24 9070   diphenhydrAMINE  (BENADRYL ) 12.5 MG/5ML elixir 12.5-25 mg  12.5-25 mg Oral Q4H PRN Patti Rosina SAUNDERS, PA-C       erythromycin  ophthalmic ointment 1 Application  1 Application Both Eyes QHS Sebastian Toribio GAILS, MD       febuxostat  (ULORIC ) tablet 40 mg  40 mg Oral Daily Sebastian Toribio GAILS, MD   40 mg at 06/24/24 1058   feeding supplement (ENSURE PLUS HIGH PROTEIN) liquid 237 mL  237 mL Oral BID BM Donovan, Ashley R, PA-C       HYDROmorphone  (DILAUDID ) injection 0.5-1 mg  0.5-1 mg Intravenous Q4H PRN Patti Rosina SAUNDERS, PA-C       ipratropium-albuterol  (DUONEB) 0.5-2.5 (3) MG/3ML nebulizer solution 3 mL  3 mL Nebulization Q6H PRN Patti Rosina SAUNDERS, PA-C       levothyroxine  (SYNTHROID ) tablet 50 mcg  50 mcg Oral QAC breakfast Patti Rosina SAUNDERS, PA-C   50 mcg at 06/24/24 9364   menthol -cetylpyridinium (CEPACOL) lozenge 3 mg  1 lozenge  Oral PRN Patti Rosina SAUNDERS, PA-C       Or   phenol (CHLORASEPTIC) mouth spray 1 spray  1 spray Mouth/Throat PRN Patti Rosina SAUNDERS, PA-C       methocarbamol  (ROBAXIN ) tablet 500 mg  500 mg Oral Q6H PRN Patti Rosina SAUNDERS, PA-C       Or   methocarbamol  (ROBAXIN ) injection 500 mg  500 mg Intravenous Q6H PRN Patti Rosina SAUNDERS, PA-C       metoCLOPramide  (REGLAN ) tablet 5-10 mg  5-10 mg Oral Q8H PRN Patti Rosina SAUNDERS, PA-C       Or   metoCLOPramide  (REGLAN ) injection 5-10 mg  5-10 mg Intravenous Q8H PRN Patti Rosina SAUNDERS, PA-C       metoprolol  succinate (TOPROL -XL) 24 hr tablet 100 mg  100 mg Oral Daily Donovan, Ashley R, PA-C   100 mg at 06/23/24 1111   naloxone  (NARCAN ) injection 0.4 mg  0.4 mg  Intravenous PRN Patti Rosina SAUNDERS, PA-C       ondansetron  (ZOFRAN ) tablet 4 mg  4 mg Oral Q6H PRN Patti Rosina SAUNDERS, PA-C       Or   ondansetron  (ZOFRAN ) injection 4 mg  4 mg Intravenous Q6H PRN Patti Rosina SAUNDERS, PA-C   4 mg at 06/24/24 0036   oxyCODONE  (Oxy IR/ROXICODONE ) immediate release tablet 2.5 mg  2.5 mg Oral Q4H PRN Patti Rosina SAUNDERS, PA-C       oxyCODONE  (Oxy IR/ROXICODONE ) immediate release tablet 5-10 mg  5-10 mg Oral Q4H PRN Patti Rosina SAUNDERS, PA-C       pantoprazole  (PROTONIX ) injection 40 mg  40 mg Intravenous Q12H Patti Rosina SAUNDERS, PA-C   40 mg at 06/24/24 9070   polyethylene glycol (MIRALAX  / GLYCOLAX ) packet 17 g  17 g Oral BID Patti Rosina SAUNDERS, PA-C   17 g at 06/24/24 9070   senna (SENOKOT) tablet 17.2 mg  2 tablet Oral QHS Patti Rosina SAUNDERS, PA-C       simethicone  (MYLICON) chewable tablet 80 mg  80 mg Oral Q6H PRN Donovan, Ashley R, PA-C         Discharge Medications: Please see discharge summary for a list of discharge medications.  Relevant Imaging Results:  Relevant Lab Results:   Additional Information SSN: [id number]  Alfonse SAUNDERS Rex, RN

## 2024-06-24 NOTE — Evaluation (Signed)
 Occupational Therapy Evaluation Patient Details Name: Christina Mckinney MRN: [id number] DOB: 1931/11/19 Today's Date: 06/24/2024   History of Present Illness   89 y.o. female fell on 06/20/24, was admitted 06/22/24 with L femoral neck fx, s/p AA-THA 06/23/24. PMH: COPD, gout, PE, CHF, anxiety, HTN, tremor, BPPV.     Clinical Impressions Pt admitted with above, prior to hospital admission pt lived alone and was independent for ADLs and mobility using 4WW. Pt does not have family to provide sufficient amout of physical assist upon hospital discharge. Pt presents to acute OT with deficits in ROM, strength, balance, activity tolerance and is not at her functional baseline. Pt performs bed mobility with MIN A, functional transfers + mobility with cues for hand placement, heavy reliance on UE support for balance using RW, and is able to walk to sink for oral care, CGA for safety, and ambulates from sink to around bed with CGA. Pt would benefit from skilled OT services to address noted impairments and functional limitations (see below for any additional details) in order to maximize safety and independence while minimizing falls risk and caregiver burden. Anticipate the need for follow up OT services upon acute hospital DC. Patient will benefit from continued inpatient follow up therapy, <3 hours/day      If plan is discharge home, recommend the following:   A lot of help with walking and/or transfers;A lot of help with bathing/dressing/bathroom;Assistance with cooking/housework;Direct supervision/assist for medications management;Direct supervision/assist for financial management;Assist for transportation;Help with stairs or ramp for entrance;Supervision due to cognitive status     Functional Status Assessment   Patient has had a recent decline in their functional status and demonstrates the ability to make significant improvements in function in a reasonable and predictable amount of time.      Equipment Recommendations   None recommended by OT      Precautions/Restrictions   Precautions Precautions: Fall Recall of Precautions/Restrictions: Intact Precaution/Restrictions Comments: fell just prior to admission, denies other falls in past 6 months Restrictions Weight Bearing Restrictions Per Provider Order: Yes LLE Weight Bearing Per Provider Order: Weight bearing as tolerated Other Position/Activity Restrictions: LLE WBAT     Mobility Bed Mobility Overal bed mobility: Needs Assistance Bed Mobility: Supine to Sit, Sit to Supine     Supine to sit: Min assist, HOB elevated, Used rails Sit to supine: Min assist   General bed mobility comments: assist to raise trunk and pivot hips    Transfers Overall transfer level: Needs assistance Equipment used: Rolling walker (2 wheels) Transfers: Sit to/from Stand Sit to Stand: Min assist           General transfer comment: cues for hand placement and to up to minA to stedy      Balance Overall balance assessment: Needs assistance, History of Falls Sitting-balance support: Feet supported Sitting balance-Leahy Scale: Good     Standing balance support: Bilateral upper extremity supported, Reliant on assistive device for balance, During functional activity Standing balance-Leahy Scale: Poor                             ADL either performed or assessed with clinical judgement   ADL Overall ADL's : Needs assistance/impaired Eating/Feeding: Independent;Sitting   Grooming: Oral care;Standing;Contact guard assist Grooming Details (indicate cue type and reason): fatigues quickly, CGA for safety at sink standing Upper Body Bathing: Minimal assistance;Sitting   Lower Body Bathing: Maximal assistance;Sit to/from stand   Upper Body Dressing :  Minimal assistance;Sitting   Lower Body Dressing: Maximal assistance;Sit to/from stand   Toilet Transfer: Minimal assistance;Ambulation;Rolling walker (2  wheels) Toilet Transfer Details (indicate cue type and reason): clinical judgement Toileting- Clothing Manipulation and Hygiene: Maximal assistance;Sit to/from stand       Functional mobility during ADLs: Minimal assistance;Contact guard assist;Cueing for sequencing;Rolling walker (2 wheels);Cueing for safety General ADL Comments: Pt walks from bed to sink, from sink around bed with RW. Expected guarding to LLE, heavy reliance on UE support for mobility     Vision Baseline Vision/History: 1 Wears glasses Ability to See in Adequate Light: 0 Adequate Patient Visual Report: No change from baseline              Pertinent Vitals/Pain Pain Assessment Pain Assessment: 0-10 Pain Score: 2  Pain Location: L hip Pain Descriptors / Indicators: Sore Pain Intervention(s): Limited activity within patient's tolerance, Monitored during session     Extremity/Trunk Assessment Upper Extremity Assessment Upper Extremity Assessment: Overall WFL for tasks assessed;Right hand dominant   Lower Extremity Assessment Lower Extremity Assessment: Defer to PT evaluation LLE Deficits / Details: tolerated LLE: Unable to fully assess due to pain   Cervical / Trunk Assessment Cervical / Trunk Assessment: Kyphotic   Communication Communication Communication: Impaired Factors Affecting Communication: Hearing impaired   Cognition Arousal: Alert Behavior During Therapy: WFL for tasks assessed/performed Cognition: No apparent impairments             OT - Cognition Comments: cognition overall WFL; question if HOH impacts ability to process directions and comprehend                 Following commands: Impaired Following commands impaired: Follows one step commands with increased time     Cueing  General Comments   Cueing Techniques: Verbal cues;Tactile cues  Dressing intact   Exercises     Shoulder Instructions      Home Living Family/patient expects to be discharged to:: Skilled  nursing facility Living Arrangements: Alone Available Help at Discharge: Family;Available PRN/intermittently Type of Home: House Home Access: Stairs to enter [employer] of Steps: 4 Entrance Stairs-Rails: Right;Left Home Layout: One level     Bathroom Shower/Tub: Runner, broadcasting/film/video: Rollator (4 wheels);Shower seat          Prior Functioning/Environment Prior Level of Function : Independent/Modified Independent;Driving             Mobility Comments: walks with a rollator, denies other falls in past 6 months ADLs Comments: independent    OT Problem List: Decreased strength;Pain;Decreased knowledge of precautions;Decreased knowledge of use of DME or AE;Decreased safety awareness;Decreased cognition;Decreased coordination;Decreased activity tolerance;Decreased range of motion;Impaired balance (sitting and/or standing)   OT Treatment/Interventions: Self-care/ADL training;Therapeutic exercise;Neuromuscular education;Energy conservation;DME and/or AE instruction;Therapeutic activities;Patient/family education;Balance training      OT Goals(Current goals can be found in the care plan section)   Acute Rehab OT Goals OT Goal Formulation: With patient Time For Goal Achievement: 07/08/24 Potential to Achieve Goals: Good   OT Frequency:  Min 3X/week    Co-evaluation              AM-PAC OT 6 Clicks Daily Activity     Outcome Measure Help from another person eating meals?: None Help from another person taking care of personal grooming?: A Little Help from another person toileting, which includes using toliet, bedpan, or urinal?: A Lot Help from another person bathing (including washing, rinsing, drying)?: A Lot Help  from another person to put on and taking off regular upper body clothing?: A Little Help from another person to put on and taking off regular lower body clothing?: A Lot 6 Click Score: 16   End of Session Equipment Utilized  During Treatment: Gait belt;Rolling walker (2 wheels) Nurse Communication: Mobility status  Activity Tolerance: Patient tolerated treatment well;No increased pain Patient left: in bed;with call bell/phone within reach;with bed alarm set;with SCD's reapplied  OT Visit Diagnosis: History of falling (Z91.81);Other abnormalities of gait and mobility (R26.89);Unsteadiness on feet (R26.81);Pain Pain - Right/Left: Left Pain - part of body: Hip                Time: 1356-1415 OT Time Calculation (min): 19 min Charges:  OT General Charges $OT Visit: 1 Visit OT Evaluation $OT Eval Low Complexity: 1 Low Cloma Rahrig L. Kavi Almquist, OTR/L  06/24/24, 2:33 PM

## 2024-06-24 NOTE — TOC Progression Note (Addendum)
 Transition of Care [facility]) - Progression Note    Patient Details  Name: Christina Mckinney MRN: [id number] Date of Birth: 1932-05-16  Transition of Care [facility]) CM/SW Contact  Alfonse JONELLE Rex, RN Phone Number: 06/24/2024, 11:55 AM  Clinical Narrative:  PT eval completed, recommendation for short term rehab/SNF, per PT , pt agreeable, prefers [facility]. [facility] updated, faxed out for bed offers.   -1:19pm Met with patient at bedside, introduce self and role of TOC/NCM. Patient confirmed  agreeable to PT recommendation for short term rehab/SNF, prefers [facility]. SNF bed offers reviewed with patient (  [facility], [facility], [facility]), pt accepted bed offer at [facility] w/SNF admissions notified. Expected dc 8/15 after 3 MN. TOC will continue to follow.                       Expected Discharge Plan and Services                                               Social Drivers of Health (SDOH) Interventions SDOH Screenings   Food Insecurity: No Food Insecurity (06/23/2024)  Housing: Low Risk  (06/23/2024)  Transportation Needs: No Transportation Needs (06/23/2024)  Utilities: Not At Risk (06/23/2024)  Depression (PHQ2-9): Low Risk  (06/11/2023)  Social Connections: Socially Isolated (06/23/2024)  Tobacco Use: Medium Risk (06/23/2024)    Readmission Risk Interventions     No data to display

## 2024-06-24 NOTE — Progress Notes (Addendum)
 PROGRESS NOTE    Christina Mckinney  FMW:[id number] DOB: [birth year] DOA: 06/22/2024 PCP: Domenica Harlene LABOR, MD    Chief Complaint  Patient presents with   Hip Pain    Brief Narrative:  89 year old with past medical history significant for CAD, heart failure preserved ejection fraction, known SVT, DVT/PE, essential tremor, GERD, gout, macular degeneration, osteoarthritis, hyperlipidemia, hypertension, hypothyroidism, anemia, anxiety, depression, prediabetes, CKD stage IIIb, COPD, BPPV who presented with left hip pain since after a mechanical fall that happened on Saturday.  Patient was going to sit in her recliner, but somehow she missed the recliner and fell on the floor instead landing on her left hip.  Since after the fall she was able to walk using a walker but has been having severe pain.  They report stable chronic shortness of breath.   X-ray obtained by PCP showed impacted left femoral neck fracture and patient sent to the ED.   Assessment & Plan:   Principal Problem:   Hip fracture (HCC) Active Problems:   COPD (chronic obstructive pulmonary disease) (HCC)   Hypoxia   Fall at home, initial encounter   CAD (coronary artery disease)   S/P total left hip arthroplasty   SVT (supraventricular tachycardia) (HCC)   Chronic heart failure with preserved ejection fraction (HCC)   Essential hypertension   #1 left hip femoral neck fracture, impacted -Secondary to mechanical fall. - Patient seen in consultation by orthopedics and patient status post left total hip replacement on 06/23/2024. - Weightbearing as tolerated. - PT/OT. - Pain management and DVT prophylaxis per orthopedics.  2.  Recent fall -CT head unremarkable.  3.  Mild hypoxia -Patient was not hypoxic initially on arrival to the ED but noted to desat to 88 to 92% on room air and placed on 2 L O2. - Felt hypoxia likely secondary to opioids as patient noted to have received Percocet several hours prior to that. - Chest  x-ray done with increased pulmonary congestion, no frank interstitial edema pleural effusion, diffuse chronic bronchial wall thickening. - Hypoxia improved patient with sats of 95 to 100% on room air.  4.  History of SVT -Continue metoprolol .  5.  Hypertension -BP soft. -Currently on metoprolol . -Continue to hold Cozaar .  6.  GERD -PPI.  7.  Hypothyroidism -Synthroid .  8.  COPD -Stable. -DuoNebs as needed.  9.  CAD/HFpEF -Stable. -Lasix  initially resumed but held due to soft blood pressure. -Once BP improves may consider resuming home regimen of Lasix  at half home dose and uptitrating back to home regimen if BP able to tolerate.  10.  Postop acute blood loss anemia -H&H stable. -Check anemia panel. -Follow H&H. -Transfusion threshold hemoglobin < 8. -   DVT prophylaxis: Aspirin  Code Status: Full Family Communication: Updated patient.  No family at bedside. Disposition: TBD, pending PT evaluation  Status is: Inpatient Remains inpatient appropriate because: Severity of illness   Consultants:  Orthopedics: Dr. Ernie 06/23/2024  Procedures:  CT head 06/23/2024 Left hip plain films of the pelvis 06/22/2024 Plain films of the pelvis 06/23/2024 Chest x-ray 06/23/2024 Left total hip replacement through anterior approach per orthopedics: Dr. Ernie 06/23/2024  Antimicrobials:  Anti-infectives (From admission, onward)    Start     Dose/Rate Route Frequency Ordered Stop   06/24/24 0100  ceFAZolin  (ANCEF ) IVPB 2g/100 mL premix        2 g 200 mL/hr over 30 Minutes Intravenous Every 6 hours 06/23/24 2010 06/24/24 0706   06/23/24 0815  ceFAZolin  (ANCEF ) IVPB 2g/100  mL premix        2 g 200 mL/hr over 30 Minutes Intravenous On call to O.R. 06/23/24 0722 06/23/24 1658         Subjective: Patient laying in bed.  Denies any chest pain or shortness of breath.  Denies any abdominal pain.  Improvement with left hip pain after surgery.  Objective: Vitals:   06/24/24 0630  06/24/24 0927 06/24/24 0956 06/24/24 1419  BP: 108/60  (!) 119/45 (!) 120/54  Pulse: 62 (!) 54 66 64  Resp: 16  18 18   Temp: 97.7 F (36.5 C)  98.1 F (36.7 C) 97.7 F (36.5 C)  TempSrc:   Oral   SpO2: 93%  100% 95%  Weight:      Height:        Intake/Output Summary (Last 24 hours) at 06/24/2024 2127 Last data filed at 06/24/2024 9082 Gross per 24 hour  Intake 1116.66 ml  Output 500 ml  Net 616.66 ml   Filed Weights   06/22/24 1748  Weight: 62.7 kg    Examination:  General exam: Appears calm and comfortable  Respiratory system: Clear to auscultation.  No wheezes, no crackles, no rhonchi.  Fair air movement.  Respiratory effort normal. Cardiovascular system: S1 & S2 heard, RRR. No JVD, murmurs, rubs, gallops or clicks. No pedal edema. Gastrointestinal system: Abdomen is nondistended, soft and nontender. No organomegaly or masses felt. Normal bowel sounds heard. Central nervous system: Alert and oriented. No focal neurological deficits. Extremities: Left lower extremity with postop dressing in place on the hip.  Skin: No rashes, lesions or ulcers Psychiatry: Judgement and insight appear normal. Mood & affect appropriate.     Data Reviewed: I have personally reviewed following labs and imaging studies  CBC: Recent Labs  Lab 06/22/24 1225 06/23/24 0611 06/24/24 0358  WBC 11.4* 8.7 10.3  NEUTROABS 9.1*  --   --   HGB 12.9 11.7* 10.1*  HCT 39.5 36.6 31.9*  MCV 96.4 99.2 100.3*  PLT 226.0 182 169    Basic Metabolic Panel: Recent Labs  Lab 06/22/24 1225 06/24/24 0358  NA 139 137  K 4.3 4.2  CL 99 108  CO2 28 21*  GLUCOSE 100* 227*  BUN 31* 36*  CREATININE 1.15 1.43*  CALCIUM  10.3 9.1    GFR: Estimated Creatinine Clearance: 22.2 mL/min (A) (by C-G formula based on SCr of 1.43 mg/dL (H)).  Liver Function Tests: Recent Labs  Lab 06/22/24 1225  AST 22  ALT 7  ALKPHOS 79  BILITOT 1.2  PROT 7.0  ALBUMIN 4.3    CBG: No results for input(s):  GLUCAP in the last 168 hours.   Recent Results (from the past 240 hours)  Urine Culture     Status: None   Collection Time: 06/22/24 12:25 PM   Specimen: Urine  Result Value Ref Range Status   MICRO NUMBER: [id number]  Final   SPECIMEN QUALITY: Adequate  Final   Sample Source NOT GIVEN  Final   STATUS: FINAL  Final   Result: No Growth  Final  Surgical pcr screen     Status: None   Collection Time: 06/23/24  5:58 AM   Specimen: Nasal Mucosa; Nasal Swab  Result Value Ref Range Status   MRSA, PCR NEGATIVE NEGATIVE Final   Staphylococcus aureus NEGATIVE NEGATIVE Final    Comment: (NOTE) The Xpert SA Assay (FDA approved for NASAL specimens in patients 91 years of age and older), is one component of a comprehensive surveillance program. It  is not intended to diagnose infection nor to guide or monitor treatment. Performed at [facility], [address], [place], [place] [address]          Radiology Studies: DG Pelvis Portable Result Date: 06/23/2024 CLINICAL DATA:  Status post hip arthroplasty EXAM: PORTABLE PELVIS 1-2 VIEWS COMPARISON:  Pelvis x-ray 04/19/2015 FINDINGS: New left hip arthroplasty appears in anatomic alignment. No evidence for fracture. There is left hip soft tissue air compatible with recent surgery. The bones are osteopenic. Degenerative changes affect the pubic symphysis, unchanged. IMPRESSION: New left hip arthroplasty appears in anatomic alignment. Electronically Signed   By: Greig Pique M.D.   On: 06/23/2024 22:36   DG HIP UNILAT WITH PELVIS 1V LEFT Result Date: 06/23/2024 CLINICAL DATA:  Elective surgery. EXAM: DG HIP (WITH OR WITHOUT PELVIS) 1V*L* COMPARISON:  None Available. FINDINGS: Two fluoroscopic spot views of the pelvis and left hip obtained in the operating room. Images during hip arthroplasty. Fluoroscopy time 10 seconds. Dose 0.66 mGy. IMPRESSION: Intraoperative fluoroscopy during left hip arthroplasty. Electronically Signed   By:  Andrea Gasman M.D.   On: 06/23/2024 18:39   DG C-Arm 1-60 Min-No Report Result Date: 06/23/2024 Fluoroscopy was utilized by the requesting physician.  No radiographic interpretation.   CT HEAD WO CONTRAST ( ) Result Date: 06/23/2024 CLINICAL DATA:  89 year old female status post fall 3 days ago. Left hip fracture. EXAM: CT HEAD WITHOUT CONTRAST TECHNIQUE: Contiguous axial images were obtained from the base of the skull through the vertex without intravenous contrast. RADIATION DOSE REDUCTION: This exam was performed according to the departmental dose-optimization program which includes automated exposure control, adjustment of the mA and/or kV according to patient size and/or use of iterative reconstruction technique. COMPARISON:  Brain MRI 01/01/2016. FINDINGS: Brain: Cerebral volume not significantly changed since 2017. No midline shift, ventriculomegaly, mass effect, evidence of mass lesion, intracranial hemorrhage or evidence of cortically based acute infarction. Normal for age gray-white differentiation. Vascular: Calcified atherosclerosis at the skull base. No suspicious intracranial vascular hyperdensity. Skull: Mild motion artifact at the mid calvarium level. No acute osseous abnormality identified. Sinuses/Orbits: Visualized paranasal sinuses and mastoids are clear. Other: No acute orbit or scalp soft tissue finding identified. IMPRESSION: No recent traumatic injury identified. Normal for age noncontrast Head CT when allowing for mild motion artifact. Electronically Signed   By: VEAR Hurst M.D.   On: 06/23/2024 10:08   Chest Portable 1 View Result Date: 06/23/2024 EXAM: 1 VIEW XRAY OF THE CHEST 06/23/2024 05:59:00 AM COMPARISON: 12/17/2018 CLINICAL HISTORY: 200808 Hypoxia 200808. hypoxia FINDINGS: LUNGS AND PLEURA: Increased pulmonary vascular congestion. No pleural effusion or pneumothorax. Diffuse, chronic bronchial wall thickening. No airspace consolidation. HEART AND MEDIASTINUM: Aortic  atherosclerosis. BONES AND SOFT TISSUES: Status post bilateral shoulder arthroplasty. No acute osseous abnormality. IMPRESSION: 1. Increased pulmonary vascular congestion. No frank interstitial edema or pleural effusion. 2. Diffuse, chronic bronchial wall thickening. 3. Aortic atherosclerotic calcification. Electronically signed by: Waddell Calk MD 06/23/2024 07:53 AM EDT RP Workstation: [id number]        Scheduled Meds:  acetaminophen   1,000 mg Oral Q6H   aspirin   81 mg Oral BID   cycloSPORINE   1 drop Both Eyes BID   erythromycin   1 Application Both Eyes QHS   febuxostat   40 mg Oral Daily   feeding supplement  237 mL Oral BID BM   levothyroxine   50 mcg Oral QAC breakfast   metoprolol  succinate  100 mg Oral Daily   pantoprazole  (PROTONIX ) IV  40 mg  Intravenous Q12H   polyethylene glycol  17 g Oral BID   senna  2 tablet Oral QHS   Continuous Infusions:  sodium chloride  75 mL/hr at 06/24/24 0917     LOS: 1 day    Time spent: 40 minutes    Toribio Hummer, MD [facility]   To contact the attending provider between 7A-7P or the covering provider during after hours 7P-7A, please log into the web site [email or web address] and access using universal [place] password for that web site. If you do not have the password, please call the hospital operator.  06/24/2024, 9:27 PM

## 2024-06-24 NOTE — Evaluation (Signed)
 Physical Therapy Evaluation Patient Details Name: Christina Mckinney MRN: [id number] DOB: 01-09-1932 Today's Date: 06/24/2024  History of Present Illness  89 y.o. female fell on 06/20/24, was admitted 06/22/24 with L femoral neck fx, s/p AA-THA 06/23/24. PMH: COPD, gout, PE, CHF, anxiety, HTN, tremor, BPPV.  Clinical Impression  Pt admitted with above diagnosis. Pt ambulated 14' x 2 with RW, no loss of balance. She requires assistance for bed mobility and transfers. She denies other falls in the past 6 months. She lives alone and stated family cannot provide 24/7 care. Patient will benefit from continued inpatient follow up therapy, <3 hours/day.  Pt currently with functional limitations due to the deficits listed below (see PT Problem List). Pt will benefit from acute skilled PT to increase their independence and safety with mobility to allow discharge.           If plan is discharge home, recommend the following: A little help with walking and/or transfers;A little help with bathing/dressing/bathroom;Assistance with cooking/housework;Help with stairs or ramp for entrance   Can travel by private vehicle   Yes    Equipment Recommendations None recommended by PT  Recommendations for Other Services       Functional Status Assessment Patient has had a recent decline in their functional status and demonstrates the ability to make significant improvements in function in a reasonable and predictable amount of time.     Precautions / Restrictions Precautions Precautions: Fall Recall of Precautions/Restrictions: Intact Precaution/Restrictions Comments: fell just prior to admission, denies other falls in past 6 months Restrictions Weight Bearing Restrictions Per Provider Order: No Other Position/Activity Restrictions: WBAT      Mobility  Bed Mobility Overal bed mobility: Needs Assistance Bed Mobility: Supine to Sit     Supine to sit: Min assist, HOB elevated, Used rails     General bed  mobility comments: assist to raise trunk and pivot hips    Transfers Overall transfer level: Needs assistance Equipment used: Rolling walker (2 wheels) Transfers: Sit to/from Stand Sit to Stand: Min assist           General transfer comment: min A to power up and to steady, verbal/manual cues for safe hand placement    Ambulation/Gait Ambulation/Gait assistance: Min assist Gait Distance (Feet): 14 Feet Assistive device: Rolling walker (2 wheels) Gait Pattern/deviations: Step-to pattern Gait velocity: decr     General Gait Details: 14' x 2 with RW, no loss of balance, VCs sequencing  Stairs            Wheelchair Mobility     Tilt Bed    Modified Rankin (Stroke Patients Only)       Balance Overall balance assessment: Needs assistance, History of Falls Sitting-balance support: Feet supported Sitting balance-Leahy Scale: Good     Standing balance support: Bilateral upper extremity supported, Reliant on assistive device for balance, During functional activity Standing balance-Leahy Scale: Poor                               Pertinent Vitals/Pain Pain Assessment Pain Assessment: 0-10 Pain Score: 3  Pain Location: L hip Pain Descriptors / Indicators: Sore Pain Intervention(s): Limited activity within patient's tolerance, Monitored during session, RN gave pain meds during session    Home Living Family/patient expects to be discharged to:: Skilled nursing facility Living Arrangements: Alone Available Help at Discharge: Family;Available PRN/intermittently Type of Home: House Home Access: Stairs to enter Entrance Stairs-Rails: Doctor, general practice  of Steps: 4   Home Layout: One level Home Equipment: Rollator (4 wheels);Shower seat      Prior Function Prior Level of Function : Independent/Modified Independent;Driving             Mobility Comments: walks with a rollator, denies other falls in past 6 months        Extremity/Trunk Assessment   Upper Extremity Assessment Upper Extremity Assessment: Overall WFL for tasks assessed    Lower Extremity Assessment Lower Extremity Assessment: LLE deficits/detail LLE Deficits / Details: tolerated LLE: Unable to fully assess due to pain    Cervical / Trunk Assessment Cervical / Trunk Assessment: Kyphotic  Communication   Communication Communication: No apparent difficulties    Cognition Arousal: Alert Behavior During Therapy: WFL for tasks assessed/performed   PT - Cognitive impairments: No apparent impairments                         Following commands: Intact       Cueing Cueing Techniques: Verbal cues     General Comments      Exercises Total Joint Exercises Ankle Circles/Pumps: AROM, Both, 10 reps, Supine Heel Slides: AAROM, Left, 10 reps, Supine   Assessment/Plan    PT Assessment Patient needs continued PT services  PT Problem List Decreased strength;Decreased activity tolerance;Decreased balance;Pain;Decreased mobility       PT Treatment Interventions Gait training;Therapeutic exercise;Therapeutic activities;Patient/family education;Functional mobility training;DME instruction    PT Goals (Current goals can be found in the Care Plan section)  Acute Rehab PT Goals Patient Stated Goal: return home after rehab, to live to 100 PT Goal Formulation: With patient Time For Goal Achievement: 07/08/24 Potential to Achieve Goals: Good    Frequency Min 3X/week     Co-evaluation               AM-PAC PT 6 Clicks Mobility  Outcome Measure Help needed turning from your back to your side while in a flat bed without using bedrails?: A Little Help needed moving from lying on your back to sitting on the side of a flat bed without using bedrails?: A Little Help needed moving to and from a bed to a chair (including a wheelchair)?: A Little Help needed standing up from a chair using your arms (e.g., wheelchair or  bedside chair)?: A Little Help needed to walk in hospital room?: A Little Help needed climbing 3-5 steps with a railing? : A Lot 6 Click Score: 17    End of Session Equipment Utilized During Treatment: Gait belt Activity Tolerance: Patient tolerated treatment well Patient left: in chair;with call bell/phone within reach (no chair alarm box in room; RN notified) Nurse Communication: Mobility status PT Visit Diagnosis: Difficulty in walking, not elsewhere classified (R26.2);Pain Pain - Right/Left: Left Pain - part of body: Hip    Time: 8954-8881 PT Time Calculation (min) (ACUTE ONLY): 33 min   Charges:   PT Evaluation $PT Eval Moderate Complexity: 1 Mod PT Treatments $Gait Training: 8-22 mins PT General Charges $$ ACUTE PT VISIT: 1 Visit         Sylvan Delon Copp PT 06/24/2024  Acute Rehabilitation Services  Office [phone]

## 2024-06-24 NOTE — Progress Notes (Signed)
 Subjective: 1 Day Post-Op Procedure(s) (LRB): ARTHROPLASTY, HIP, TOTAL, ANTERIOR APPROACH (Left) Patient reports pain as mild.   Patient seen in rounds for Dr. Ernie. Patient is resting in bed on exam this morning. No acute events overnight. Foley catheter removed. Patient has not been up with PT yet.  We will start therapy today.   Objective: Vital signs in last 24 hours: Temp:  [97.4 F (36.3 C)-98 F (36.7 C)] 97.7 F (36.5 C) (08/13 0630) Pulse Rate:  [62-84] 62 (08/13 0630) Resp:  [14-16] 16 (08/13 0630) BP: (76-132)/(46-97) 108/60 (08/13 0630) SpO2:  [86 %-100 %] 93 % (08/13 0630)  Intake/Output from previous day:  Intake/Output Summary (Last 24 hours) at 06/24/2024 0815 Last data filed at 06/24/2024 0630 Gross per 24 hour  Intake 1440 ml  Output 800 ml  Net 640 ml     Intake/Output this shift: No intake/output data recorded.  Labs: Recent Labs    06/22/24 1225 06/23/24 0611 06/24/24 0358  HGB 12.9 11.7* 10.1*   Recent Labs    06/23/24 0611 06/24/24 0358  WBC 8.7 10.3  RBC 3.69* 3.18*  HCT 36.6 31.9*  PLT 182 169   Recent Labs    06/22/24 1225 06/24/24 0358  NA 139 137  K 4.3 4.2  CL 99 108  CO2 28 21*  BUN 31* 36*  CREATININE 1.15 1.43*  GLUCOSE 100* 227*  CALCIUM  10.3 9.1   No results for input(s): LABPT, INR in the last 72 hours.  Exam: General - Patient is Alert and Oriented Extremity - Neurologically intact Sensation intact distally Intact pulses distally Dorsiflexion/Plantar flexion intact Dressing - dressing C/D/I Motor Function - intact, moving foot and toes well on exam.   Past Medical History:  Diagnosis Date   Anemia, unspecified    Anxiety and depression 09/18/2015   Anxiety state, unspecified    Chest pain    Chronic systolic CHF (congestive heart failure) (HCC) 08/12/2015   Depression with anxiety 06/02/2007   Qualifier: Diagnosis of  By: Georgian ROSALEA CHARM Lamar    Depressive disorder, not elsewhere classified     Diverticulitis    DVT (deep venous thrombosis) (HCC)    Dyspnea 07/22/2015   Edema 06/12/2014   Essential and other specified forms of tremor    GERD (gastroesophageal reflux disease)    Gout, unspecified    Internal hemorrhoids without mention of complication    Macular degeneration 08/07/2015   Osteoarthritis    Other abnormal glucose    Other and unspecified hyperlipidemia    Personal history of colonic polyps    Personal history of peptic ulcer disease    Pulmonary embolism (HCC)    Unspecified disorder of bladder    Unspecified essential hypertension    Unspecified hypothyroidism    Varicose veins     Assessment/Plan: 1 Day Post-Op Procedure(s) (LRB): ARTHROPLASTY, HIP, TOTAL, ANTERIOR APPROACH (Left) Principal Problem:   Hip fracture (HCC) Active Problems:   COPD (chronic obstructive pulmonary disease) (HCC)   Hypoxia   Fall at home, initial encounter   CAD (coronary artery disease)   S/P total left hip arthroplasty  Estimated body mass index is 26.11 kg/m as calculated from the following:   Height as of this encounter: 5' 1 (1.549 m).   Weight as of this encounter: 62.7 kg. Advance diet Up with therapy  DVT Prophylaxis - Aspirin  Weight bearing as tolerated.  Hgb stable at 10.1 this AM  Up with PT today  Planned disposition is home with daughter/son  staying with her  Hopeful for d/c home tomorrow or Friday   Rosina Calin, [place] [facility] [phone] 06/24/2024, 8:15 AM

## 2024-06-24 NOTE — Plan of Care (Signed)
  Problem: Education: Goal: Knowledge of General Education information will improve Description: Including pain rating scale, medication(s)/side effects and non-pharmacologic comfort measures Outcome: Progressing   Problem: Health Behavior/Discharge Planning: Goal: Ability to manage health-related needs will improve Outcome: Progressing   Problem: Clinical Measurements: Goal: Ability to maintain clinical measurements within normal limits will improve Outcome: Progressing Goal: Will remain free from infection Outcome: Progressing Goal: Diagnostic test results will improve Outcome: Progressing Goal: Respiratory complications will improve Outcome: Progressing Goal: Cardiovascular complication will be avoided Outcome: Progressing   Problem: Activity: Goal: Risk for activity intolerance will decrease Outcome: Progressing   Problem: Nutrition: Goal: Adequate nutrition will be maintained Outcome: Progressing   Problem: Coping: Goal: Level of anxiety will decrease Outcome: Progressing   Problem: Elimination: Goal: Will not experience complications related to bowel motility Outcome: Progressing Goal: Will not experience complications related to urinary retention Outcome: Progressing   Problem: Pain Managment: Goal: General experience of comfort will improve and/or be controlled Outcome: Progressing   Problem: Safety: Goal: Ability to remain free from injury will improve Outcome: Progressing   Problem: Skin Integrity: Goal: Risk for impaired skin integrity will decrease Outcome: Adequate for Discharge   Problem: Education: Goal: Knowledge of the prescribed therapeutic regimen will improve Outcome: Progressing Goal: Understanding of discharge needs will improve Outcome: Progressing Goal: Individualized Educational Video(s) Outcome: Completed/Met   Problem: Activity: Goal: Ability to avoid complications of mobility impairment will improve Outcome: Progressing Goal:  Ability to tolerate increased activity will improve Outcome: Adequate for Discharge   Problem: Clinical Measurements: Goal: Postoperative complications will be avoided or minimized Outcome: Progressing   Problem: Pain Management: Goal: Pain level will decrease with appropriate interventions Outcome: Adequate for Discharge   Problem: Skin Integrity: Goal: Will show signs of wound healing Outcome: Progressing

## 2024-06-25 ENCOUNTER — Inpatient Hospital Stay (HOSPITAL_COMMUNITY)

## 2024-06-25 DIAGNOSIS — I10 Essential (primary) hypertension: Secondary | ICD-10-CM | POA: Diagnosis not present

## 2024-06-25 DIAGNOSIS — I471 Supraventricular tachycardia, unspecified: Secondary | ICD-10-CM | POA: Diagnosis not present

## 2024-06-25 DIAGNOSIS — J189 Pneumonia, unspecified organism: Secondary | ICD-10-CM | POA: Diagnosis present

## 2024-06-25 DIAGNOSIS — S72002P Fracture of unspecified part of neck of left femur, subsequent encounter for closed fracture with malunion: Secondary | ICD-10-CM | POA: Diagnosis not present

## 2024-06-25 DIAGNOSIS — D72829 Elevated white blood cell count, unspecified: Secondary | ICD-10-CM

## 2024-06-25 LAB — URINALYSIS, COMPLETE (UACMP) WITH MICROSCOPIC
Bacteria, UA: NONE SEEN
Bilirubin Urine: NEGATIVE
Glucose, UA: NEGATIVE mg/dL
Hgb urine dipstick: NEGATIVE
Ketones, ur: NEGATIVE mg/dL
Leukocytes,Ua: NEGATIVE
Nitrite: NEGATIVE
Protein, ur: NEGATIVE mg/dL
Specific Gravity, Urine: 1.01 (ref 1.005–1.030)
pH: 5 (ref 5.0–8.0)

## 2024-06-25 LAB — BASIC METABOLIC PANEL WITH GFR
Anion gap: 9 (ref 5–15)
BUN: 40 mg/dL — ABNORMAL HIGH (ref 8–23)
CO2: 20 mmol/L — ABNORMAL LOW (ref 22–32)
Calcium: 9 mg/dL (ref 8.9–10.3)
Chloride: 105 mmol/L (ref 98–111)
Creatinine, Ser: 1.36 mg/dL — ABNORMAL HIGH (ref 0.44–1.00)
GFR, Estimated: 37 mL/min — ABNORMAL LOW (ref >60)
Glucose, Bld: 143 mg/dL — ABNORMAL HIGH (ref 70–99)
Potassium: 4.9 mmol/L (ref 3.5–5.1)
Sodium: 134 mmol/L — ABNORMAL LOW (ref 135–145)

## 2024-06-25 LAB — CBC
HCT: 31 % — ABNORMAL LOW (ref 36.0–46.0)
Hemoglobin: 9.7 g/dL — ABNORMAL LOW (ref 12.0–15.0)
MCH: 31.5 pg (ref 26.0–34.0)
MCHC: 31.3 g/dL (ref 30.0–36.0)
MCV: 100.6 fL — ABNORMAL HIGH (ref 80.0–100.0)
Platelets: 208 K/uL (ref 150–400)
RBC: 3.08 MIL/uL — ABNORMAL LOW (ref 3.87–5.11)
RDW: 13.9 % (ref 11.5–15.5)
WBC: 18.1 K/uL — ABNORMAL HIGH (ref 4.0–10.5)
nRBC: 0 % (ref 0.0–0.2)

## 2024-06-25 LAB — IRON AND TIBC
Iron: 18 ug/dL — ABNORMAL LOW (ref 28–170)
Saturation Ratios: 8 % — ABNORMAL LOW (ref 10.4–31.8)
TIBC: 217 ug/dL — ABNORMAL LOW (ref 250–450)
UIBC: 199 ug/dL

## 2024-06-25 LAB — HIV ANTIBODY (ROUTINE TESTING W REFLEX): HIV Screen 4th Generation wRfx: NONREACTIVE

## 2024-06-25 LAB — VITAMIN B12: Vitamin B-12: 1122 pg/mL — ABNORMAL HIGH (ref 180–914)

## 2024-06-25 LAB — STREP PNEUMONIAE URINARY ANTIGEN: Strep Pneumo Urinary Antigen: NEGATIVE

## 2024-06-25 LAB — FERRITIN: Ferritin: 259 ng/mL (ref 11–307)

## 2024-06-25 LAB — FOLATE: Folate: 9.6 ng/mL (ref >5.9)

## 2024-06-25 MED ORDER — SODIUM CHLORIDE 0.9 % IV SOLN: INTRAVENOUS | Status: DC

## 2024-06-25 MED ORDER — SODIUM CHLORIDE 0.9 % IV SOLN
500.0000 mg | Freq: Every day | INTRAVENOUS | Status: DC
  Administered 2024-06-25 – 2024-06-26 (×2): 500 mg via INTRAVENOUS
  Filled 2024-06-25 (×3): qty 5

## 2024-06-25 MED ORDER — ASPIRIN 81 MG PO CHEW: 81.0000 mg | CHEWABLE_TABLET | Freq: Two times a day (BID) | ORAL | 0 refills | Status: AC

## 2024-06-25 MED ORDER — METHOCARBAMOL 500 MG PO TABS: 500.0000 mg | ORAL_TABLET | Freq: Four times a day (QID) | ORAL | 2 refills | Status: AC | PRN

## 2024-06-25 MED ORDER — GUAIFENESIN ER 600 MG PO TB12
600.0000 mg | ORAL_TABLET | Freq: Two times a day (BID) | ORAL | Status: DC
  Administered 2024-06-25 – 2024-06-29 (×9): 600 mg via ORAL
  Filled 2024-06-25 (×9): qty 1

## 2024-06-25 MED ORDER — SODIUM CHLORIDE 0.9 % IV SOLN
2.0000 g | Freq: Every day | INTRAVENOUS | Status: AC
  Administered 2024-06-25 – 2024-06-29 (×5): 2 g via INTRAVENOUS
  Filled 2024-06-25 (×5): qty 20

## 2024-06-25 MED ORDER — OXYCODONE HCL 5 MG PO TABS: 2.5000 mg | ORAL_TABLET | Freq: Four times a day (QID) | ORAL | 0 refills | Status: AC | PRN

## 2024-06-25 NOTE — Progress Notes (Signed)
 Subjective: 2 Days Post-Op Procedure(s) (LRB): ARTHROPLASTY, HIP, TOTAL, ANTERIOR APPROACH (Left) Patient reports pain as mild.   Patient seen in rounds for Dr. Ernie. Patient is resting in bed on exam this morning. No acute events overnight. She did well with PT yesterday and reports she is comfortable other than being cold. We will start therapy today.   Objective: Vital signs in last 24 hours: Temp:  [97.4 F (36.3 C)-98.1 F (36.7 C)] 97.6 F (36.4 C) (08/14 0153) Pulse Rate:  [63-68] 68 (08/14 0703) Resp:  [18] 18 (08/14 0703) BP: (112-122)/(45-77) 122/77 (08/14 0703) SpO2:  [94 %-100 %] 94 % (08/14 0703)  Intake/Output from previous day:  Intake/Output Summary (Last 24 hours) at 06/25/2024 0930 Last data filed at 06/25/2024 0200 Gross per 24 hour  Intake 660 ml  Output --  Net 660 ml     Intake/Output this shift: No intake/output data recorded.  Labs: Recent Labs    06/22/24 1225 06/23/24 0611  0358 06/25/24 0612  HGB 12.9 11.7* 10.1* 9.7*   Recent Labs     0358 06/25/24 0612  WBC 10.3 18.1*  RBC 3.18* 3.08*  HCT 31.9* 31.0*  PLT 169 208   Recent Labs     0358 06/25/24 0345  NA 137 134*  K 4.2 4.9  CL 108 105  CO2 21* 20*  BUN 36* 40*  CREATININE 1.43* 1.36*  GLUCOSE 227* 143*  CALCIUM  9.1 9.0   No results for input(s): LABPT, INR in the last 72 hours.  Exam: General - Patient is Alert and Oriented Extremity - Neurologically intact Sensation intact distally Intact pulses distally Dorsiflexion/Plantar flexion intact Dressing - dressing C/D/I Motor Function - intact, moving foot and toes well on exam.   Past Medical History:  Diagnosis Date   Anemia, unspecified    Anxiety and depression 09/18/2015   Anxiety state, unspecified    Chest pain    Chronic systolic CHF (congestive heart failure) (HCC) 08/12/2015   Depression with anxiety 06/02/2007   Qualifier: Diagnosis of  By: Georgian ROSALEA CHARM Lamar    Depressive  disorder, not elsewhere classified    Diverticulitis    DVT (deep venous thrombosis) (HCC)    Dyspnea 07/22/2015   Edema 06/12/2014   Essential and other specified forms of tremor    GERD (gastroesophageal reflux disease)    Gout, unspecified    Internal hemorrhoids without mention of complication    Macular degeneration 08/07/2015   Osteoarthritis    Other abnormal glucose    Other and unspecified hyperlipidemia    Personal history of colonic polyps    Personal history of peptic ulcer disease    Pulmonary embolism (HCC)    Unspecified disorder of bladder    Unspecified essential hypertension    Unspecified hypothyroidism    Varicose veins     Assessment/Plan: 2 Days Post-Op Procedure(s) (LRB): ARTHROPLASTY, HIP, TOTAL, ANTERIOR APPROACH (Left) Principal Problem:   Hip fracture (HCC) Active Problems:   COPD (chronic obstructive pulmonary disease) (HCC)   Hypoxia   Fall at home, initial encounter   CAD (coronary artery disease)   S/P total left hip arthroplasty   SVT (supraventricular tachycardia) (HCC)   Chronic heart failure with preserved ejection fraction (HCC)   Essential hypertension  Estimated body mass index is 26.11 kg/m as calculated from the following:   Height as of this encounter: 5' 1 (1.549 m).   Weight as of this encounter: 62.7 kg. Advance diet Up with therapy  DVT Prophylaxis -  Aspirin  Weight bearing as tolerated.  Hgb stable at 9.7 this AM.  Plan is now for SNF Hopefully she will be very motivated so that this is a benefit to her  Rx printed  Rosina Calin, PA-C Orthopedic Surgery [phone] 06/25/2024, 9:30 AM

## 2024-06-25 NOTE — Progress Notes (Signed)
 Physical Therapy Treatment Patient Details Name: Christina Mckinney MRN: [id number] DOB: 1932/06/15 Today's Date: 06/25/2024   History of Present Illness 89 y.o. female fell on 06/20/24, was admitted 06/22/24 with L femoral neck fx, s/p AA-THA 06/23/24. PMH: COPD, gout, PE, CHF, anxiety, HTN, tremor, BPPV.    PT Comments  Pt is mobilizing well. She tolerated increased ambulation distance of 72' with RW, no loss of balance. Patient will benefit from continued inpatient follow up therapy, <3 hours/day.     If plan is discharge home, recommend the following: A little help with walking and/or transfers;A little help with bathing/dressing/bathroom;Assistance with cooking/housework;Help with stairs or ramp for entrance   Can travel by private vehicle     Yes  Equipment Recommendations  None recommended by PT    Recommendations for Other Services       Precautions / Restrictions Precautions Precautions: Fall Recall of Precautions/Restrictions: Intact Precaution/Restrictions Comments: fell just prior to admission, denies other falls in past 6 months Restrictions Weight Bearing Restrictions Per Provider Order: Yes Other Position/Activity Restrictions: LLE WBAT     Mobility  Bed Mobility Overal bed mobility: Needs Assistance Bed Mobility: Supine to Sit     Supine to sit: Min assist, HOB elevated, Used rails     General bed mobility comments: assist to raise trunk and pivot hips    Transfers Overall transfer level: Needs assistance Equipment used: Rolling walker (2 wheels) Transfers: Sit to/from Stand Sit to Stand: Min assist           General transfer comment: min  A to power up    Ambulation/Gait Ambulation/Gait assistance: Contact guard assist Gait Distance (Feet): 90 Feet Assistive device: Rolling walker (2 wheels) Gait Pattern/deviations: Step-to pattern Gait velocity: decr     General Gait Details: steady, no loss of balance, distance limited by pain   Stairs              Wheelchair Mobility     Tilt Bed    Modified Rankin (Stroke Patients Only)       Balance Overall balance assessment: Needs assistance, History of Falls Sitting-balance support: Feet supported Sitting balance-Leahy Scale: Good     Standing balance support: Bilateral upper extremity supported, Reliant on assistive device for balance, During functional activity Standing balance-Leahy Scale: Poor                              Communication Communication Communication: Impaired Factors Affecting Communication: Hearing impaired  Cognition Arousal: Alert Behavior During Therapy: WFL for tasks assessed/performed   PT - Cognitive impairments: No apparent impairments                         Following commands: Impaired Following commands impaired: Follows one step commands with increased time    Cueing Cueing Techniques: Verbal cues, Tactile cues  Exercises Total Joint Exercises Ankle Circles/Pumps: AROM, Both, 10 reps, Supine Short Arc Quad: AROM, Left, 10 reps, Supine Heel Slides: AAROM, Left, 10 reps, Supine Hip ABduction/ADduction: AAROM, Left, 10 reps, Supine Long Arc Quad: AROM, Left, 10 reps, Seated    General Comments        Pertinent Vitals/Pain Pain Assessment Pain Score: 5  Pain Location: L hip Pain Descriptors / Indicators: Sore Pain Intervention(s): Limited activity within patient's tolerance, Monitored during session, Repositioned, Patient requesting pain meds-RN notified    Home Living  Prior Function            PT Goals (current goals can now be found in the care plan section) Acute Rehab PT Goals Patient Stated Goal: return home after rehab, to live to 100 PT Goal Formulation: With patient Time For Goal Achievement: 07/08/24 Potential to Achieve Goals: Good Progress towards PT goals: Progressing toward goals    Frequency    Min 3X/week      PT Plan       Co-evaluation              AM-PAC PT 6 Clicks Mobility   Outcome Measure  Help needed turning from your back to your side while in a flat bed without using bedrails?: A Little Help needed moving from lying on your back to sitting on the side of a flat bed without using bedrails?: A Little Help needed moving to and from a bed to a chair (including a wheelchair)?: A Little Help needed standing up from a chair using your arms (e.g., wheelchair or bedside chair)?: A Little Help needed to walk in hospital room?: A Little Help needed climbing 3-5 steps with a railing? : A Little 6 Click Score: 18    End of Session Equipment Utilized During Treatment: Gait belt Activity Tolerance: Patient tolerated treatment well Patient left: in chair;with call bell/phone within reach;with chair alarm set Nurse Communication: Mobility status PT Visit Diagnosis: Difficulty in walking, not elsewhere classified (R26.2);Pain Pain - Right/Left: Left Pain - part of body: Hip     Time: 8540-8487 PT Time Calculation (min) (ACUTE ONLY): 13 min  Charges:    $Gait Training: 8-22 mins PT General Charges $$ ACUTE PT VISIT: 1 Visit                     Sylvan Nest Kistler PT 06/25/2024  Acute Rehabilitation Services  Office [phone]

## 2024-06-25 NOTE — Progress Notes (Signed)
 Physical Therapy Treatment Patient Details Name: Christina Mckinney MRN: [id number] DOB: 02-Apr-1932 Today's Date: 06/25/2024   History of Present Illness 89 y.o. female fell on 06/20/24, was admitted 06/22/24 with L femoral neck fx, s/p AA-THA 06/23/24. PMH: COPD, gout, PE, CHF, anxiety, HTN, tremor, BPPV.    PT Comments  Pt is progressing well with mobility, she tolerated increased ambulation distance of 84' with RW. Reviewed HEP. Patient will benefit from continued inpatient follow up therapy, <3 hours/day.     If plan is discharge home, recommend the following: A little help with walking and/or transfers;A little help with bathing/dressing/bathroom;Assistance with cooking/housework;Help with stairs or ramp for entrance   Can travel by private vehicle     Yes  Equipment Recommendations  None recommended by PT    Recommendations for Other Services       Precautions / Restrictions Precautions Precautions: Fall Recall of Precautions/Restrictions: Intact Precaution/Restrictions Comments: fell just prior to admission, denies other falls in past 6 months Restrictions Weight Bearing Restrictions Per Provider Order: Yes Other Position/Activity Restrictions: LLE WBAT     Mobility  Bed Mobility Overal bed mobility: Needs Assistance       Supine to sit: Min assist, HOB elevated, Used rails     General bed mobility comments: assist to raise trunk and pivot hips    Transfers Overall transfer level: Needs assistance Equipment used: Rolling walker (2 wheels) Transfers: Sit to/from Stand Sit to Stand: Min assist           General transfer comment: min  A to power up    Ambulation/Gait Ambulation/Gait assistance: Contact guard assist Gait Distance (Feet): 80 Feet Assistive device: Rolling walker (2 wheels) Gait Pattern/deviations: Step-to pattern Gait velocity: decr     General Gait Details: steady, no loss of balance, distance limited by pain   Stairs              Wheelchair Mobility     Tilt Bed    Modified Rankin (Stroke Patients Only)       Balance Overall balance assessment: Needs assistance, History of Falls Sitting-balance support: Feet supported Sitting balance-Leahy Scale: Good     Standing balance support: Bilateral upper extremity supported, Reliant on assistive device for balance, During functional activity Standing balance-Leahy Scale: Poor                              Communication Communication Communication: Impaired Factors Affecting Communication: Hearing impaired  Cognition Arousal: Alert Behavior During Therapy: WFL for tasks assessed/performed   PT - Cognitive impairments: No apparent impairments                         Following commands: Impaired Following commands impaired: Follows one step commands with increased time    Cueing Cueing Techniques: Verbal cues, Tactile cues  Exercises Total Joint Exercises Ankle Circles/Pumps: AROM, Both, 10 reps, Supine Short Arc Quad: AROM, Left, 10 reps, Supine Heel Slides: AAROM, Left, 10 reps, Supine Hip ABduction/ADduction: AAROM, Left, 10 reps, Supine Long Arc Quad: AROM, Left, 10 reps, Seated    General Comments        Pertinent Vitals/Pain Pain Assessment Pain Score: 3  Pain Location: L hip Pain Descriptors / Indicators: Sore Pain Intervention(s): Limited activity within patient's tolerance, Monitored during session, Premedicated before session, Ice applied, Repositioned    Home Living  Prior Function            PT Goals (current goals can now be found in the care plan section) Acute Rehab PT Goals Patient Stated Goal: return home after rehab, to live to 100 PT Goal Formulation: With patient Time For Goal Achievement: 07/08/24 Potential to Achieve Goals: Good Progress towards PT goals: Progressing toward goals    Frequency    Min 3X/week      PT Plan      Co-evaluation               AM-PAC PT 6 Clicks Mobility   Outcome Measure  Help needed turning from your back to your side while in a flat bed without using bedrails?: A Little Help needed moving from lying on your back to sitting on the side of a flat bed without using bedrails?: A Little Help needed moving to and from a bed to a chair (including a wheelchair)?: A Little Help needed standing up from a chair using your arms (e.g., wheelchair or bedside chair)?: A Little Help needed to walk in hospital room?: A Little Help needed climbing 3-5 steps with a railing? : A Little 6 Click Score: 18    End of Session Equipment Utilized During Treatment: Gait belt Activity Tolerance: Patient tolerated treatment well Patient left: in chair;with call bell/phone within reach;with chair alarm set Nurse Communication: Mobility status PT Visit Diagnosis: Difficulty in walking, not elsewhere classified (R26.2);Pain Pain - Right/Left: Left Pain - part of body: Hip     Time: 8880-8863 PT Time Calculation (min) (ACUTE ONLY): 17 min  Charges:    $Gait Training: 8-22 mins PT General Charges $$ ACUTE PT VISIT: 1 Visit                     Sylvan Nest Kistler PT 06/25/2024  Acute Rehabilitation Services  Office [phone]

## 2024-06-25 NOTE — Anesthesia Postprocedure Evaluation (Signed)
 Anesthesia Post Note  Patient: Christina Mckinney  Procedure(s) Performed: ARTHROPLASTY, HIP, TOTAL, ANTERIOR APPROACH (Left: Hip)     Patient location during evaluation: PACU Anesthesia Type: Spinal Level of consciousness: awake and alert Pain management: pain level controlled Vital Signs Assessment: post-procedure vital signs reviewed and stable Respiratory status: spontaneous breathing and respiratory function stable Cardiovascular status: blood pressure returned to baseline and stable Postop Assessment: spinal receding Anesthetic complications: no   No notable events documented.  Last Vitals:  Vitals:   06/25/24 0153 06/25/24 0703  BP: (!) 112/56 122/77  Pulse: 63 68  Resp: 18 18  Temp: 36.4 C   SpO2: 95% 94%    Last Pain:  Vitals:   06/25/24 0613  TempSrc:   PainSc: 3                  Epifanio Lamar BRAVO

## 2024-06-25 NOTE — Progress Notes (Signed)
 PROGRESS NOTE    Christina Mckinney  FMW:[id number] DOB: 03-14-1932 DOA: 06/22/2024 PCP: Domenica Harlene LABOR, MD    Chief Complaint  Patient presents with   Hip Pain    Brief Narrative:  89 year old with past medical history significant for CAD, heart failure preserved ejection fraction, known SVT, DVT/PE, essential tremor, GERD, gout, macular degeneration, osteoarthritis, hyperlipidemia, hypertension, hypothyroidism, anemia, anxiety, depression, prediabetes, CKD stage IIIb, COPD, BPPV who presented with left hip pain since after a mechanical fall that happened on Saturday.  Patient was going to sit in her recliner, but somehow she missed the recliner and fell on the floor instead landing on her left hip.  Since after the fall she was able to walk using a walker but has been having severe pain.  They report stable chronic shortness of breath.   X-ray obtained by PCP showed impacted left femoral neck fracture and patient sent to the ED.   Assessment & Plan:   Principal Problem:   Hip fracture (HCC) Active Problems:   COPD (chronic obstructive pulmonary disease) (HCC)   Hypoxia   Fall at home, initial encounter   CAD (coronary artery disease)   S/P total left hip arthroplasty   SVT (supraventricular tachycardia) (HCC)   Chronic heart failure with preserved ejection fraction (HCC)   Essential hypertension   CAP (community acquired pneumonia)   Leukocytosis   #1 left hip femoral neck fracture, impacted -Secondary to mechanical fall. - Patient seen in consultation by orthopedics and patient status post left total hip replacement on 06/23/2024. - Weightbearing as tolerated. - PT/OT. - Pain management and DVT prophylaxis per orthopedics.  2.  Recent fall -CT head unremarkable.  3.  Mild hypoxia -Patient was not hypoxic initially on arrival to the ED but noted to desat to 88 to 92% on room air and placed on 2 L O2. - Felt hypoxia likely secondary to opioids as patient noted to have  received Percocet several hours prior to that. - Chest x-ray done on admission with increased pulmonary congestion, no frank interstitial edema pleural effusion, diffuse chronic bronchial wall thickening. - Hypoxia improved patient with sats of 97 to 100% on room air.  4.  Leukocytosis/CAP - Patient noted with significant leukocytosis with complaints of congestion and cough. - Chest x-ray done this morning with patchy airspace opacity in the left lung base which may represent atelectasis or developing bronchopneumonia. - Check urine Legionella antigen, urine strep pneumococcus antigen, sputum Gram stain and culture. - Place empirically on IV Rocephin , IV azithromycin . - Place on Mucinex .  5.  History of SVT - Metoprolol .   6.  Hypertension -BP soft. -Currently on metoprolol . - Continue to hold Cozaar .   7.  GERD - Continue PPI.  8.  Hypothyroidism - Continue Synthroid .  9.  COPD -Stable. -DuoNebs as needed.  10.  CAD/HFpEF -Stable. -Lasix  initially resumed but held due to soft blood pressure. -Once BP improves may consider resuming home regimen of Lasix  at half home dose and uptitrating back to home regimen if BP able to tolerate.  11.  Postop acute blood loss anemia -H&H stable. - Anemia panel with iron level of 18, TIBC of 217, ferritin of 259, folate of 9.6, vitamin B12 of 1122.  - Hemoglobin currently stable at 9.7. -Follow H&H. -Transfusion threshold hemoglobin < 8. -   DVT prophylaxis: Aspirin  Code Status: Full Family Communication: Updated patient and son at bedside. Disposition: SNF when clinically improved and medically stable and cleared by orthopedics.   Status  is: Inpatient Remains inpatient appropriate because: Severity of illness   Consultants:  Orthopedics: Dr. Ernie 06/23/2024  Procedures:  CT head 06/23/2024 Left hip plain films of the pelvis 06/22/2024 Plain films of the pelvis 06/23/2024 Chest x-ray 06/23/2024, 06/25/2024 Left total hip  replacement through anterior approach per orthopedics: Dr. Ernie 06/23/2024  Antimicrobials:  Anti-infectives (From admission, onward)    Start     Dose/Rate Route Frequency Ordered Stop   06/25/24 1400  cefTRIAXone  (ROCEPHIN ) 2 g in sodium chloride  0.9 % 100 mL IVPB        2 g 200 mL/hr over 30 Minutes Intravenous Daily 06/25/24 1306 06/30/24 0959   06/25/24 1400  azithromycin  (ZITHROMAX ) 500 mg in sodium chloride  0.9 % 250 mL IVPB        500 mg 250 mL/hr over 60 Minutes Intravenous Daily 06/25/24 1306 06/30/24 0959   06/24/24 0100  ceFAZolin  (ANCEF ) IVPB 2g/100 mL premix        2 g 200 mL/hr over 30 Minutes Intravenous Every 6 hours 06/23/24 2010 06/24/24 0706   06/23/24 0815  ceFAZolin  (ANCEF ) IVPB 2g/100 mL premix        2 g 200 mL/hr over 30 Minutes Intravenous On call to O.R. 06/23/24 0722 06/23/24 1658         Subjective: Patient sitting up in recliner, son at bedside.  Patient with some complaints of congestion and productive cough.  Denies any chest pain.  No abdominal pain.  Left hip pain with significant improvement postoperatively per patient.   Objective: Vitals:   06/25/24 0153 06/25/24 0703 06/25/24 1048 06/25/24 1310  BP: (!) 112/56 122/77 122/62 (!) 127/57  Pulse: 63 68 62 68  Resp: 18 18 16 15   Temp: 97.6 F (36.4 C)  97.6 F (36.4 C) 97.7 F (36.5 C)  TempSrc: Oral  Oral Oral  SpO2: 95% 94% 97% 100%  Weight:      Height:        Intake/Output Summary (Last 24 hours) at 06/25/2024 1926 Last data filed at 06/25/2024 1900 Gross per 24 hour  Intake 1344.13 ml  Output 300 ml  Net 1044.13 ml   Filed Weights   06/22/24 1748  Weight: 62.7 kg    Examination:  General exam: NAD. Respiratory system: Some coarse breath sounds in the bases otherwise clear.  No wheezing, no crackles.  Fair air movement.  Speaking in full sentences.   Cardiovascular system: Regular rate rhythm no murmurs rubs or gallops.  No JVD.  No lower extremity edema.  Gastrointestinal  system: Abdomen is soft, nontender, nondistended, positive bowel sounds.  No rebound.  No guarding.  Central nervous system: Alert and oriented. No focal neurological deficits. Extremities: Left lower extremity with postop dressing in place on the hip.  Skin: No rashes, lesions or ulcers Psychiatry: Judgement and insight appear normal. Mood & affect appropriate.     Data Reviewed: I have personally reviewed following labs and imaging studies  CBC: Recent Labs  Lab 06/22/24 1225 06/23/24 0611 06/24/24 0358 06/25/24 0612  WBC 11.4* 8.7 10.3 18.1*  NEUTROABS 9.1*  --   --   --   HGB 12.9 11.7* 10.1* 9.7*  HCT 39.5 36.6 31.9* 31.0*  MCV 96.4 99.2 100.3* 100.6*  PLT 226.0 182 169 208    Basic Metabolic Panel: Recent Labs  Lab 06/22/24 1225 06/24/24 0358 06/25/24 0345  NA 139 137 134*  K 4.3 4.2 4.9  CL 99 108 105  CO2 28 21* 20*  GLUCOSE 100*  227* 143*  BUN 31* 36* 40*  CREATININE 1.15 1.43* 1.36*  CALCIUM  10.3 9.1 9.0    GFR: Estimated Creatinine Clearance: 23.4 mL/min (A) (by C-G formula based on SCr of 1.36 mg/dL (H)).  Liver Function Tests: Recent Labs  Lab 06/22/24 1225  AST 22  ALT 7  ALKPHOS 79  BILITOT 1.2  PROT 7.0  ALBUMIN 4.3    CBG: No results for input(s): GLUCAP in the last 168 hours.   Recent Results (from the past 240 hours)  Urine Culture     Status: None   Collection Time: 06/22/24 12:25 PM   Specimen: Urine  Result Value Ref Range Status   MICRO NUMBER: [id number]  Final   SPECIMEN QUALITY: Adequate  Final   Sample Source NOT GIVEN  Final   STATUS: FINAL  Final   Result: No Growth  Final  Surgical pcr screen     Status: None   Collection Time: 06/23/24  5:58 AM   Specimen: Nasal Mucosa; Nasal Swab  Result Value Ref Range Status   MRSA, PCR NEGATIVE NEGATIVE Final   Staphylococcus aureus NEGATIVE NEGATIVE Final    Comment: (NOTE) The Xpert SA Assay (FDA approved for NASAL specimens in patients 30 years of age and older), is  one component of a comprehensive surveillance program. It is not intended to diagnose infection nor to guide or monitor treatment. Performed at [facility], [address] [place], [place] [address]          Radiology Studies: DG CHEST PORT 1 VIEW Result Date: 06/25/2024 CLINICAL DATA:  100030 Leukocytosis 100030 EXAM: PORTABLE CHEST - 1 VIEW COMPARISON:  June 23, 2024 FINDINGS: Patchy opacities in the left lung base. The cardiac silhouette is at the upper limits of normal, likely accentuated by AP technique and low lung volumes. Tortuous aorta with aortic atherosclerosis. No acute fracture or destructive lesions. Multilevel thoracic osteophytosis. Bilateral shoulder arthroplasties. IMPRESSION: Patchy airspace opacities in the left lung base, which may represent atelectasis or a developing bronchopneumonia, in the correct clinical context. Electronically Signed   By: Rogelia Myers M.D.   On: 06/25/2024 11:02        Scheduled Meds:  acetaminophen   1,000 mg Oral Q6H   aspirin   81 mg Oral BID   cycloSPORINE   1 drop Both Eyes BID   erythromycin   1 Application Both Eyes QHS   febuxostat   40 mg Oral Daily   feeding supplement  237 mL Oral BID BM   guaiFENesin   600 mg Oral BID   levothyroxine   50 mcg Oral QAC breakfast   metoprolol  succinate  100 mg Oral Daily   pantoprazole  (PROTONIX ) IV  40 mg Intravenous Q12H   polyethylene glycol  17 g Oral BID   senna  2 tablet Oral QHS   Continuous Infusions:  azithromycin  250 mL/hr at 06/25/24 1659   cefTRIAXone  (ROCEPHIN )  IV Stopped (06/25/24 1553)     LOS: 2 days    Time spent: 40 minutes    Toribio Hummer, MD [facility]   To contact the attending provider between 7A-7P or the covering provider during after hours 7P-7A, please log into the web site [email or web address] and access using universal [place] password for that web site. If you do not have the password, please call the hospital  operator.  06/25/2024, 7:26 PM

## 2024-06-26 DIAGNOSIS — I10 Essential (primary) hypertension: Secondary | ICD-10-CM | POA: Diagnosis not present

## 2024-06-26 DIAGNOSIS — K5900 Constipation, unspecified: Secondary | ICD-10-CM

## 2024-06-26 DIAGNOSIS — J189 Pneumonia, unspecified organism: Secondary | ICD-10-CM | POA: Diagnosis not present

## 2024-06-26 DIAGNOSIS — I471 Supraventricular tachycardia, unspecified: Secondary | ICD-10-CM | POA: Diagnosis not present

## 2024-06-26 DIAGNOSIS — S72002P Fracture of unspecified part of neck of left femur, subsequent encounter for closed fracture with malunion: Secondary | ICD-10-CM | POA: Diagnosis not present

## 2024-06-26 LAB — BASIC METABOLIC PANEL WITH GFR
Anion gap: 8 (ref 5–15)
BUN: 36 mg/dL — ABNORMAL HIGH (ref 8–23)
CO2: 20 mmol/L — ABNORMAL LOW (ref 22–32)
Calcium: 9.3 mg/dL (ref 8.9–10.3)
Chloride: 109 mmol/L (ref 98–111)
Creatinine, Ser: 1.19 mg/dL — ABNORMAL HIGH (ref 0.44–1.00)
GFR, Estimated: 43 mL/min — ABNORMAL LOW (ref >60)
Glucose, Bld: 106 mg/dL — ABNORMAL HIGH (ref 70–99)
Potassium: 5.1 mmol/L (ref 3.5–5.1)
Sodium: 137 mmol/L (ref 135–145)

## 2024-06-26 LAB — CBC WITH DIFFERENTIAL/PLATELET
Abs Immature Granulocytes: 0.12 K/uL — ABNORMAL HIGH (ref 0.00–0.07)
Basophils Absolute: 0 K/uL (ref 0.0–0.1)
Basophils Relative: 0 %
Eosinophils Absolute: 0 K/uL (ref 0.0–0.5)
Eosinophils Relative: 0 %
HCT: 29.2 % — ABNORMAL LOW (ref 36.0–46.0)
Hemoglobin: 9.3 g/dL — ABNORMAL LOW (ref 12.0–15.0)
Immature Granulocytes: 1 %
Lymphocytes Relative: 13 %
Lymphs Abs: 1.6 K/uL (ref 0.7–4.0)
MCH: 32.2 pg (ref 26.0–34.0)
MCHC: 31.8 g/dL (ref 30.0–36.0)
MCV: 101 fL — ABNORMAL HIGH (ref 80.0–100.0)
Monocytes Absolute: 1.1 K/uL — ABNORMAL HIGH (ref 0.1–1.0)
Monocytes Relative: 9 %
Neutro Abs: 9.7 K/uL — ABNORMAL HIGH (ref 1.7–7.7)
Neutrophils Relative %: 77 %
Platelets: 208 K/uL (ref 150–400)
RBC: 2.89 MIL/uL — ABNORMAL LOW (ref 3.87–5.11)
RDW: 14.2 % (ref 11.5–15.5)
WBC: 12.6 K/uL — ABNORMAL HIGH (ref 4.0–10.5)
nRBC: 0 % (ref 0.0–0.2)

## 2024-06-26 LAB — LEGIONELLA PNEUMOPHILA SEROGP 1 UR AG: L. pneumophila Serogp 1 Ur Ag: NEGATIVE

## 2024-06-26 LAB — MAGNESIUM: Magnesium: 2.6 mg/dL — ABNORMAL HIGH (ref 1.7–2.4)

## 2024-06-26 MED ORDER — SORBITOL 70 % SOLN
30.0000 mL | Status: AC
Start: 2024-06-26 — End: 2024-06-26
  Administered 2024-06-26 (×2): 30 mL via ORAL
  Filled 2024-06-26 (×2): qty 30

## 2024-06-26 NOTE — Progress Notes (Signed)
 PROGRESS NOTE    Christina Mckinney  FMW:[id number] DOB: 1932/02/19 DOA: 06/22/2024 PCP: Domenica Harlene LABOR, MD    Chief Complaint  Patient presents with   Hip Pain    Brief Narrative:  89 year old with past medical history significant for CAD, heart failure preserved ejection fraction, known SVT, DVT/PE, essential tremor, GERD, gout, macular degeneration, osteoarthritis, hyperlipidemia, hypertension, hypothyroidism, anemia, anxiety, depression, prediabetes, CKD stage IIIb, COPD, BPPV who presented with left hip pain since after a mechanical fall that happened on Saturday.  Patient was going to sit in her recliner, but somehow she missed the recliner and fell on the floor instead landing on her left hip.  Since after the fall she was able to walk using a walker but has been having severe pain.  They report stable chronic shortness of breath.   X-ray obtained by PCP showed impacted left femoral neck fracture and patient sent to the ED.   Assessment & Plan:   Principal Problem:   Hip fracture (HCC) Active Problems:   COPD (chronic obstructive pulmonary disease) (HCC)   Hypoxia   Fall at home, initial encounter   CAD (coronary artery disease)   S/P total left hip arthroplasty   SVT (supraventricular tachycardia) (HCC)   Chronic heart failure with preserved ejection fraction (HCC)   Essential hypertension   CAP (community acquired pneumonia)   Leukocytosis   #1 left hip femoral neck fracture, impacted -Secondary to mechanical fall. - Patient seen in consultation by orthopedics and patient status post left total hip replacement on 06/23/2024. - Weightbearing as tolerated. - PT/OT. - Pain management and DVT prophylaxis per orthopedics.  2.  Recent fall -CT head unremarkable.  3.  Mild hypoxia -Patient was not hypoxic initially on arrival to the ED but noted to desat to 88 to 92% on room air and placed on 2 L O2. - Felt hypoxia likely secondary to opioids as patient noted to have  received Percocet several hours prior to that. - Chest x-ray done on admission with increased pulmonary congestion, no frank interstitial edema pleural effusion, diffuse chronic bronchial wall thickening. - Hypoxia improved patient with sats of 97 to 100% on room air. - Continue antibiotics for CAP.  4.  Leukocytosis/CAP - Patient noted with significant leukocytosis with complaints of congestion and cough. - Chest x-ray done on 06/25/2024, with patchy airspace opacity in the left lung base which may represent atelectasis or developing bronchopneumonia. - Leukocytosis trended down since starting empiric IV antibiotics.  - Urine pneumococcus antigen negative.  - Urine Legionella antigen pending.  - Continue empiric IV Rocephin , IV azithromycin .  - Continue Mucinex .   5.  History of SVT - Metoprolol .   6.  Hypertension -BP soft. - Continue metoprolol .   - Continue to hold Cozaar .   7.  GERD - PPI.    8.  Hypothyroidism - Synthroid .   9.  COPD -Stable. -DuoNebs as needed.  10.  CAD/HFpEF -Stable. -Lasix  initially resumed but held due to soft blood pressure. -Once BP improves may consider resuming home regimen of Lasix  at half home dose and uptitrating back to home regimen if BP able to tolerate.  11.  Postop acute blood loss anemia -H&H stable. - Anemia panel with iron level of 18, TIBC of 217, ferritin of 259, folate of 9.6, vitamin B12 of 1122.  - Hemoglobin currently stable at 9.3. -Follow H&H. -Transfusion threshold hemoglobin < 8.  12.  Constipation -Patient with complaints of constipation, no bowel movement in approximately 5 days. -  Continue MiraLAX  twice daily, Senokot-S twice daily. - Sorbitol  every 3 hours x 2 doses. - Dulcolax suppository as needed.   DVT prophylaxis: Aspirin  Code Status: Full Family Communication: Updated patient.  No family at bedside.  Disposition: SNF when clinically improved and medically stable and cleared by orthopedics.   Status  is: Inpatient Remains inpatient appropriate because: Severity of illness   Consultants:  Orthopedics: Dr. Ernie 06/23/2024  Procedures:  CT head 06/23/2024 Left hip plain films of the pelvis 06/22/2024 Plain films of the pelvis 06/23/2024 Chest x-ray 06/23/2024, 06/25/2024 Left total hip replacement through anterior approach per orthopedics: Dr. Ernie 06/23/2024  Antimicrobials:  Anti-infectives (From admission, onward)    Start     Dose/Rate Route Frequency Ordered Stop   06/25/24 1400  cefTRIAXone  (ROCEPHIN ) 2 g in sodium chloride  0.9 % 100 mL IVPB        2 g 200 mL/hr over 30 Minutes Intravenous Daily 06/25/24 1306 06/30/24 0959   06/25/24 1400  azithromycin  (ZITHROMAX ) 500 mg in sodium chloride  0.9 % 250 mL IVPB        500 mg 250 mL/hr over 60 Minutes Intravenous Daily 06/25/24 1306 06/30/24 0959   06/24/24 0100  ceFAZolin  (ANCEF ) IVPB 2g/100 mL premix        2 g 200 mL/hr over 30 Minutes Intravenous Every 6 hours 06/23/24 2010 06/24/24 0706   06/23/24 0815  ceFAZolin  (ANCEF ) IVPB 2g/100 mL premix        2 g 200 mL/hr over 30 Minutes Intravenous On call to O.R. 06/23/24 0722 06/23/24 1658         Subjective: Patient lying in bed.  Patient still with a productive cough.  Patient states some shortness of breath on exertion.  Denies any chest pain.  No abdominal pain.  Improvement with left hip pain and some discomfort in the left thigh per patient.  Patient states no bowel movement in approximately 5 days.  Objective: Vitals:   06/26/24 0535 06/26/24 0757 06/26/24 1321 06/26/24 1323  BP: (!) 115/53 116/63 (!) 88/57 (!) 123/58  Pulse: (!) 58 (!) 58 66 64  Resp: 18  16 16   Temp: 97.9 F (36.6 C)     TempSrc: Oral     SpO2: 94% 97% 96% 100%  Weight:      Height:        Intake/Output Summary (Last 24 hours) at 06/26/2024 1636 Last data filed at 06/26/2024 1411 Gross per 24 hour  Intake 1190.14 ml  Output 700 ml  Net 490.14 ml   Filed Weights   06/22/24 1748  Weight:  62.7 kg    Examination:  General exam: NAD. Respiratory system: Coarse breath sounds in the bases some coarse breath sounds in the bases otherwise clear.  No wheezing, no crackles.  Fair air movement.  Speaking in full sentences.   Cardiovascular system: RRR no murmurs rubs or gallops.  No JVD.  No pitting lower extremity edema.  Gastrointestinal system: Abdomen is soft, nontender, nondistended, positive bowel sounds.  No rebound.  No guarding.  Central nervous system: Alert and oriented.  Moving extremities spontaneously.  No focal neurological deficits. Extremities: Left lower extremity with postop dressing in place on the hip.  Skin: No rashes, lesions or ulcers Psychiatry: Judgement and insight appear normal. Mood & affect appropriate.     Data Reviewed: I have personally reviewed following labs and imaging studies  CBC: Recent Labs  Lab 06/22/24 1225 06/23/24 0611 06/24/24 0358 06/25/24 0612 06/26/24 0334  WBC 11.4*  8.7 10.3 18.1* 12.6*  NEUTROABS 9.1*  --   --   --  9.7*  HGB 12.9 11.7* 10.1* 9.7* 9.3*  HCT 39.5 36.6 31.9* 31.0* 29.2*  MCV 96.4 99.2 100.3* 100.6* 101.0*  PLT 226.0 182 169 208 208    Basic Metabolic Panel: Recent Labs  Lab 06/22/24 1225 06/24/24 0358 06/25/24 0345 06/26/24 0334  NA 139 137 134* 137  K 4.3 4.2 4.9 5.1  CL 99 108 105 109  CO2 28 21* 20* 20*  GLUCOSE 100* 227* 143* 106*  BUN 31* 36* 40* 36*  CREATININE 1.15 1.43* 1.36* 1.19*  CALCIUM  10.3 9.1 9.0 9.3  MG  --   --   --  2.6*    GFR: Estimated Creatinine Clearance: 26.7 mL/min (A) (by C-G formula based on SCr of 1.19 mg/dL (H)).  Liver Function Tests: Recent Labs  Lab 06/22/24 1225  AST 22  ALT 7  ALKPHOS 79  BILITOT 1.2  PROT 7.0  ALBUMIN 4.3    CBG: No results for input(s): GLUCAP in the last 168 hours.   Recent Results (from the past 240 hours)  Urine Culture     Status: None   Collection Time: 06/22/24 12:25 PM   Specimen: Urine  Result Value Ref Range  Status   MICRO NUMBER: [id number]  Final   SPECIMEN QUALITY: Adequate  Final   Sample Source NOT GIVEN  Final   STATUS: FINAL  Final   Result: No Growth  Final  Surgical pcr screen     Status: None   Collection Time: 06/23/24  5:58 AM   Specimen: Nasal Mucosa; Nasal Swab  Result Value Ref Range Status   MRSA, PCR NEGATIVE NEGATIVE Final   Staphylococcus aureus NEGATIVE NEGATIVE Final    Comment: (NOTE) The Xpert SA Assay (FDA approved for NASAL specimens in patients 43 years of age and older), is one component of a comprehensive surveillance program. It is not intended to diagnose infection nor to guide or monitor treatment. Performed at [facility], [address] [place], [place] [address]          Radiology Studies: DG CHEST PORT 1 VIEW Result Date: 06/25/2024 CLINICAL DATA:  100030 Leukocytosis 100030 EXAM: PORTABLE CHEST - 1 VIEW COMPARISON:  June 23, 2024 FINDINGS: Patchy opacities in the left lung base. The cardiac silhouette is at the upper limits of normal, likely accentuated by AP technique and low lung volumes. Tortuous aorta with aortic atherosclerosis. No acute fracture or destructive lesions. Multilevel thoracic osteophytosis. Bilateral shoulder arthroplasties. IMPRESSION: Patchy airspace opacities in the left lung base, which may represent atelectasis or a developing bronchopneumonia, in the correct clinical context. Electronically Signed   By: Rogelia Myers M.D.   On: 06/25/2024 11:02        Scheduled Meds:  acetaminophen   1,000 mg Oral Q6H   aspirin   81 mg Oral BID   cycloSPORINE   1 drop Both Eyes BID   erythromycin   1 Application Both Eyes QHS   febuxostat   40 mg Oral Daily   feeding supplement  237 mL Oral BID BM   guaiFENesin   600 mg Oral BID   levothyroxine   50 mcg Oral QAC breakfast   metoprolol  succinate  100 mg Oral Daily   pantoprazole  (PROTONIX ) IV  40 mg Intravenous Q12H   polyethylene glycol  17 g Oral BID   senna  2  tablet Oral QHS   Continuous Infusions:  azithromycin  500 mg (06/26/24 1021)   cefTRIAXone  (  ROCEPHIN )  IV 2 g (06/26/24 1125)     LOS: 3 days    Time spent: 40 minutes    Toribio Hummer, MD [facility]   To contact the attending provider between 7A-7P or the covering provider during after hours 7P-7A, please log into the web site [email or web address] and access using universal [place] password for that web site. If you do not have the password, please call the hospital operator.  06/26/2024, 4:36 PM

## 2024-06-26 NOTE — Progress Notes (Signed)
 Physical Therapy Treatment Patient Details Name: Christina Mckinney MRN: [id number] DOB: [birth year] Today's Date: 06/26/2024   History of Present Illness 89 y.o. female fell on 06/20/24, was admitted 06/22/24 with L femoral neck fx, s/p AA-THA 06/23/24. PMH: COPD, gout, PE, CHF, anxiety, HTN, tremor, BPPV.    PT Comments  Pt tolerated increased ambulation distance of 110' with RW, no loss of balance, distance limited by pain and fatigue. 2/4 dyspnea with walking, SpO2 98% on room air ~1 minute after walking. Noted CXR 06/25/24 showed possible pneumonia.     If plan is discharge home, recommend the following: A little help with walking and/or transfers;A little help with bathing/dressing/bathroom;Assistance with cooking/housework;Help with stairs or ramp for entrance   Can travel by private vehicle     Yes  Equipment Recommendations  None recommended by PT    Recommendations for Other Services       Precautions / Restrictions Precautions Precautions: Fall Recall of Precautions/Restrictions: Intact Precaution/Restrictions Comments: fell just prior to admission, denies other falls in past 6 months Restrictions Weight Bearing Restrictions Per Provider Order: Yes LLE Weight Bearing Per Provider Order: Weight bearing as tolerated Other Position/Activity Restrictions: LLE WBAT     Mobility  Bed Mobility Overal bed mobility: Needs Assistance Bed Mobility: Supine to Sit     Supine to sit: Min assist, HOB elevated, Used rails     General bed mobility comments: assist to raise trunk and pivot hips    Transfers Overall transfer level: Needs assistance Equipment used: Rolling walker (2 wheels) Transfers: Sit to/from Stand Sit to Stand: Min assist           General transfer comment: min  A to power up    Ambulation/Gait Ambulation/Gait assistance: Contact guard assist Gait Distance (Feet): 110 Feet Assistive device: Rolling walker (2 wheels) Gait Pattern/deviations: Step-to  pattern Gait velocity: decr     General Gait Details: steady, no loss of balance, distance limited by pain   Stairs             Wheelchair Mobility     Tilt Bed    Modified Rankin (Stroke Patients Only)       Balance Overall balance assessment: Needs assistance, History of Falls Sitting-balance support: Feet supported Sitting balance-Leahy Scale: Good     Standing balance support: Bilateral upper extremity supported, Reliant on assistive device for balance, During functional activity Standing balance-Leahy Scale: Poor                              Communication Communication Communication: Impaired Factors Affecting Communication: Hearing impaired  Cognition Arousal: Alert Behavior During Therapy: WFL for tasks assessed/performed   PT - Cognitive impairments: No apparent impairments                         Following commands: Impaired Following commands impaired: Follows one step commands with increased time    Cueing Cueing Techniques: Verbal cues, Tactile cues  Exercises Total Joint Exercises Ankle Circles/Pumps: AROM, Both, 10 reps, Supine Heel Slides: AAROM, Left, 10 reps, Supine Hip ABduction/ADduction: AAROM, Left, 10 reps, Supine    General Comments        Pertinent Vitals/Pain Pain Assessment Pain Score: 4  Pain Location: L hip Pain Descriptors / Indicators: Sore Pain Intervention(s): Limited activity within patient's tolerance, Monitored during session, Premedicated before session, Repositioned    Home Living  Prior Function            PT Goals (current goals can now be found in the care plan section) Acute Rehab PT Goals Patient Stated Goal: return home after rehab, to live to 100 PT Goal Formulation: With patient Time For Goal Achievement: 07/08/24 Potential to Achieve Goals: Good Progress towards PT goals: Progressing toward goals    Frequency    Min  3X/week      PT Plan      Co-evaluation              AM-PAC PT 6 Clicks Mobility   Outcome Measure  Help needed turning from your back to your side while in a flat bed without using bedrails?: A Little Help needed moving from lying on your back to sitting on the side of a flat bed without using bedrails?: A Little Help needed moving to and from a bed to a chair (including a wheelchair)?: A Little Help needed standing up from a chair using your arms (e.g., wheelchair or bedside chair)?: A Little Help needed to walk in hospital room?: A Little Help needed climbing 3-5 steps with a railing? : A Little 6 Click Score: 18    End of Session Equipment Utilized During Treatment: Gait belt Activity Tolerance: Patient tolerated treatment well Patient left: in chair;with call bell/phone within reach;Other (comment) (chair alarm box needs new batteries, NT notified) Nurse Communication: Mobility status PT Visit Diagnosis: Difficulty in walking, not elsewhere classified (R26.2);Pain Pain - Right/Left: Left Pain - part of body: Hip     Time: 8980-8954 PT Time Calculation (min) (ACUTE ONLY): 26 min  Charges:    $Gait Training: 8-22 mins $Therapeutic Exercise: 8-22 mins PT General Charges $$ ACUTE PT VISIT: 1 Visit                    Sylvan Nest Kistler PT 06/26/2024  Acute Rehabilitation Services  Office [phone]

## 2024-06-26 NOTE — TOC Progression Note (Signed)
 Transition of Care [facility]) - Progression Note    Patient Details  Name: Christina Mckinney MRN: [id number] Date of Birth: [birth year]  [facility] CM/SW Contact  Alfonse JONELLE Rex, RN Phone Number: 06/26/2024, 10:02 AM  Clinical Narrative:  Met with attending on unit, reports patient developed a Pneumonia, started on iv abx. [employer], rep- Tracey, notified of probable dc on Monday.                         Expected Discharge Plan and Services                                               Social Drivers of Health (SDOH) Interventions SDOH Screenings   Food Insecurity: No Food Insecurity (06/23/2024)  Housing: Low Risk  (06/23/2024)  Transportation Needs: No Transportation Needs (06/23/2024)  Utilities: Not At Risk (06/23/2024)  Depression (PHQ2-9): Low Risk  (06/11/2023)  Social Connections: Socially Isolated (06/23/2024)  Tobacco Use: Medium Risk (06/23/2024)    Readmission Risk Interventions     No data to display

## 2024-06-26 NOTE — Plan of Care (Signed)
 Problem: Education: Goal: Knowledge of General Education information will improve Description: Including pain rating scale, medication(s)/side effects and non-pharmacologic comfort measures Outcome: Progressing   Problem: Health Behavior/Discharge Planning: Goal: Ability to manage health-related needs will improve Outcome: Progressing   Problem: Clinical Measurements: Goal: Ability to maintain clinical measurements within normal limits will improve Outcome: Progressing Goal: Will remain free from infection Outcome: Progressing Goal: Diagnostic test results will improve Outcome: Progressing Goal: Respiratory complications will improve Outcome: Progressing Goal: Cardiovascular complication will be avoided Outcome: Progressing   Problem: Activity: Goal: Risk for activity intolerance will decrease Outcome: Progressing   Problem: Nutrition: Goal: Adequate nutrition will be maintained Outcome: Progressing   Problem: Coping: Goal: Level of anxiety will decrease Outcome: Progressing   Problem: Elimination: Goal: Will not experience complications related to bowel motility Outcome: Progressing Goal: Will not experience complications related to urinary retention Outcome: Progressing   Problem: Pain Managment: Goal: General experience of comfort will improve and/or be controlled Outcome: Progressing   Problem: Safety: Goal: Ability to remain free from injury will improve Outcome: Progressing   Problem: Skin Integrity: Goal: Risk for impaired skin integrity will decrease Outcome: Progressing   Problem: Education: Goal: Knowledge of the prescribed therapeutic regimen will improve Outcome: Progressing Goal: Understanding of discharge needs will improve Outcome: Progressing   Problem: Activity: Goal: Ability to avoid complications of mobility impairment will improve Outcome: Progressing Goal: Ability to tolerate increased activity will improve Outcome: Progressing    Problem: Clinical Measurements: Goal: Postoperative complications will be avoided or minimized Outcome: Progressing   Problem: Pain Management: Goal: Pain level will decrease with appropriate interventions Outcome: Progressing   Problem: Skin Integrity: Goal: Will show signs of wound healing Outcome: Progressing   Problem: Activity: Goal: Ability to tolerate increased activity will improve Outcome: Progressing   Problem: Clinical Measurements: Goal: Ability to maintain a body temperature in the normal range will improve Outcome: Progressing   Problem: Respiratory: Goal: Ability to maintain adequate ventilation will improve Outcome: Progressing Goal: Ability to maintain a clear airway will improve Outcome: Progressing   Problem: Education: Goal: Knowledge of General Education information will improve Description: Including pain rating scale, medication(s)/side effects and non-pharmacologic comfort measures Outcome: Progressing   Problem: Health Behavior/Discharge Planning: Goal: Ability to manage health-related needs will improve Outcome: Progressing   Problem: Clinical Measurements: Goal: Ability to maintain clinical measurements within normal limits will improve Outcome: Progressing Goal: Will remain free from infection Outcome: Progressing Goal: Diagnostic test results will improve Outcome: Progressing Goal: Respiratory complications will improve Outcome: Progressing Goal: Cardiovascular complication will be avoided Outcome: Progressing   Problem: Activity: Goal: Risk for activity intolerance will decrease Outcome: Progressing   Problem: Nutrition: Goal: Adequate nutrition will be maintained Outcome: Progressing   Problem: Coping: Goal: Level of anxiety will decrease Outcome: Progressing   Problem: Elimination: Goal: Will not experience complications related to bowel motility Outcome: Progressing Goal: Will not experience complications related to  urinary retention Outcome: Progressing   Problem: Pain Managment: Goal: General experience of comfort will improve and/or be controlled Outcome: Progressing   Problem: Safety: Goal: Ability to remain free from injury will improve Outcome: Progressing   Problem: Skin Integrity: Goal: Risk for impaired skin integrity will decrease Outcome: Progressing   Problem: Education: Goal: Knowledge of the prescribed therapeutic regimen will improve Outcome: Progressing Goal: Understanding of discharge needs will improve Outcome: Progressing   Problem: Activity: Goal: Ability to avoid complications of mobility impairment will improve Outcome: Progressing Goal: Ability to tolerate increased activity will  improve Outcome: Progressing   Problem: Clinical Measurements: Goal: Postoperative complications will be avoided or minimized Outcome: Progressing   Problem: Pain Management: Goal: Pain level will decrease with appropriate interventions Outcome: Progressing   Problem: Skin Integrity: Goal: Will show signs of wound healing Outcome: Progressing   Problem: Activity: Goal: Ability to tolerate increased activity will improve Outcome: Progressing   Problem: Clinical Measurements: Goal: Ability to maintain a body temperature in the normal range will improve Outcome: Progressing   Problem: Respiratory: Goal: Ability to maintain adequate ventilation will improve Outcome: Progressing Goal: Ability to maintain a clear airway will improve Outcome: Progressing

## 2024-06-27 DIAGNOSIS — I10 Essential (primary) hypertension: Secondary | ICD-10-CM | POA: Diagnosis not present

## 2024-06-27 DIAGNOSIS — S72002P Fracture of unspecified part of neck of left femur, subsequent encounter for closed fracture with malunion: Secondary | ICD-10-CM | POA: Diagnosis not present

## 2024-06-27 DIAGNOSIS — I471 Supraventricular tachycardia, unspecified: Secondary | ICD-10-CM | POA: Diagnosis not present

## 2024-06-27 DIAGNOSIS — J189 Pneumonia, unspecified organism: Secondary | ICD-10-CM | POA: Diagnosis not present

## 2024-06-27 LAB — CBC WITH DIFFERENTIAL/PLATELET
Abs Immature Granulocytes: 0.23 K/uL — ABNORMAL HIGH (ref 0.00–0.07)
Basophils Absolute: 0.1 K/uL (ref 0.0–0.1)
Basophils Relative: 1 %
Eosinophils Absolute: 0.3 K/uL (ref 0.0–0.5)
Eosinophils Relative: 3 %
HCT: 30.7 % — ABNORMAL LOW (ref 36.0–46.0)
Hemoglobin: 9.6 g/dL — ABNORMAL LOW (ref 12.0–15.0)
Immature Granulocytes: 2 %
Lymphocytes Relative: 20 %
Lymphs Abs: 2.1 K/uL (ref 0.7–4.0)
MCH: 31.6 pg (ref 26.0–34.0)
MCHC: 31.3 g/dL (ref 30.0–36.0)
MCV: 101 fL — ABNORMAL HIGH (ref 80.0–100.0)
Monocytes Absolute: 0.8 K/uL (ref 0.1–1.0)
Monocytes Relative: 8 %
Neutro Abs: 6.8 K/uL (ref 1.7–7.7)
Neutrophils Relative %: 66 %
Platelets: 226 K/uL (ref 150–400)
RBC: 3.04 MIL/uL — ABNORMAL LOW (ref 3.87–5.11)
RDW: 14.4 % (ref 11.5–15.5)
WBC: 10.3 K/uL (ref 4.0–10.5)
nRBC: 0 % (ref 0.0–0.2)

## 2024-06-27 LAB — URINE CULTURE: Culture: 20000 — AB

## 2024-06-27 LAB — BASIC METABOLIC PANEL WITH GFR
Anion gap: 8 (ref 5–15)
BUN: 37 mg/dL — ABNORMAL HIGH (ref 8–23)
CO2: 21 mmol/L — ABNORMAL LOW (ref 22–32)
Calcium: 9.3 mg/dL (ref 8.9–10.3)
Chloride: 110 mmol/L (ref 98–111)
Creatinine, Ser: 1.11 mg/dL — ABNORMAL HIGH (ref 0.44–1.00)
GFR, Estimated: 47 mL/min — ABNORMAL LOW (ref >60)
Glucose, Bld: 94 mg/dL (ref 70–99)
Potassium: 4.3 mmol/L (ref 3.5–5.1)
Sodium: 139 mmol/L (ref 135–145)

## 2024-06-27 MED ORDER — FUROSEMIDE 20 MG PO TABS
20.0000 mg | ORAL_TABLET | Freq: Every day | ORAL | Status: DC
  Administered 2024-06-27 – 2024-06-29 (×3): 20 mg via ORAL
  Filled 2024-06-27 (×3): qty 1

## 2024-06-27 MED ORDER — AZITHROMYCIN 250 MG PO TABS
250.0000 mg | ORAL_TABLET | Freq: Every day | ORAL | Status: AC
  Administered 2024-06-27 – 2024-06-29 (×3): 250 mg via ORAL
  Filled 2024-06-27 (×3): qty 1

## 2024-06-27 MED ORDER — POLYETHYLENE GLYCOL 3350 17 G PO PACK: 17.0000 g | PACK | Freq: Every day | ORAL | Status: DC | PRN

## 2024-06-27 MED ORDER — SENNA 8.6 MG PO TABS: 2.0000 | ORAL_TABLET | Freq: Every day | ORAL | Status: DC

## 2024-06-27 MED ORDER — PANTOPRAZOLE SODIUM 40 MG PO TBEC
40.0000 mg | DELAYED_RELEASE_TABLET | Freq: Two times a day (BID) | ORAL | Status: DC
  Administered 2024-06-27 – 2024-06-29 (×5): 40 mg via ORAL
  Filled 2024-06-27 (×5): qty 1

## 2024-06-27 NOTE — Progress Notes (Signed)
 PROGRESS NOTE    Christina Mckinney  FMW:[id number] DOB: 1932-01-31 DOA: 06/22/2024 PCP: Domenica Harlene LABOR, MD    Chief Complaint  Patient presents with   Hip Pain    Brief Narrative:  89 year old with past medical history significant for CAD, heart failure preserved ejection fraction, known SVT, DVT/PE, essential tremor, GERD, gout, macular degeneration, osteoarthritis, hyperlipidemia, hypertension, hypothyroidism, anemia, anxiety, depression, prediabetes, CKD stage IIIb, COPD, BPPV who presented with left hip pain since after a mechanical fall that happened on Saturday.  Patient was going to sit in her recliner, but somehow she missed the recliner and fell on the floor instead landing on her left hip.  Since after the fall she was able to walk using a walker but has been having severe pain.  They report stable chronic shortness of breath.   X-ray obtained by PCP showed impacted left femoral neck fracture and patient sent to the ED.   Assessment & Plan:   Principal Problem:   Hip fracture (HCC) Active Problems:   COPD (chronic obstructive pulmonary disease) (HCC)   Hypoxia   Fall at home, initial encounter   CAD (coronary artery disease)   S/P total left hip arthroplasty   SVT (supraventricular tachycardia) (HCC)   Chronic heart failure with preserved ejection fraction (HCC)   Essential hypertension   CAP (community acquired pneumonia)   Leukocytosis   #1 left hip femoral neck fracture, impacted -Secondary to mechanical fall. - Patient seen in consultation by orthopedics and patient status post left total hip replacement on 06/23/2024. - Weightbearing as tolerated. - PT/OT. - Pain management and DVT prophylaxis per orthopedics.  2.  Recent fall -CT head unremarkable.  3.  Mild hypoxia -Patient was not hypoxic initially on arrival to the ED but noted to desat to 88 to 92% on room air and placed on 2 L O2. - Felt hypoxia likely secondary to opioids as patient noted to have  received Percocet several hours prior to that. - Chest x-ray done on admission with increased pulmonary congestion, no frank interstitial edema pleural effusion, diffuse chronic bronchial wall thickening. - Hypoxia improved patient with sats of 96% on room air.   - Continue antibiotics for CAP.  4.  Leukocytosis/CAP - Patient noted with significant leukocytosis with complaints of congestion and cough. - Chest x-ray done on 06/25/2024, with patchy airspace opacity in the left lung base which may represent atelectasis or developing bronchopneumonia. - Leukocytosis trended down since starting empiric IV antibiotics.  - Urine pneumococcus antigen negative.  - Urine Legionella antigen negative.  - Continue empiric IV Rocephin . - Change IV azithromycin  to oral azithromycin . - Continue Mucinex . - Supportive care.  5.  History of SVT - Continue home regimen metoprolol .  6.  Hypertension -BP was initially soft but has improved.   - Continue metoprolol . - Will start half home dose of Lasix  at 20 mg daily.    - Continue to hold Cozaar .   7.  GERD - Continue PPI.   8.  Hypothyroidism - Continue Synthroid .   9.  COPD -Stable. -DuoNebs as needed.  10.  CAD/HFpEF -Stable. -Lasix  initially resumed but held due to soft blood pressure. - Resume half home dose of Lasix  at 20 mg daily.  11.  Postop acute blood loss anemia -H&H stable. - Anemia panel with iron level of 18, TIBC of 217, ferritin of 259, folate of 9.6, vitamin B12 of 1122.  - Hemoglobin currently stable at 9.6. -Follow H&H. -Transfusion threshold hemoglobin < 8.  12.  Constipation -Patient with complaints of constipation, no bowel movement in approximately 5 days. - Patient on MiraLAX  twice daily, Senokot-S twice daily and received sorbitol  every 3 hours x 2 doses on 06/26/2024 with good results.   - Hold Senokot today.  - Will change MiraLAX  to daily as needed.  - Dulcolax suppository as needed.   DVT prophylaxis:  Aspirin  Code Status: Full Family Communication: Updated patient.  No family at bedside.  Disposition: SNF when clinically improved and medically stable and cleared by orthopedics hopefully in the next 48 hours.   Status is: Inpatient Remains inpatient appropriate because: Severity of illness   Consultants:  Orthopedics: Dr. Ernie 06/23/2024  Procedures:  CT head 06/23/2024 Left hip plain films of the pelvis 06/22/2024 Plain films of the pelvis 06/23/2024 Chest x-ray 06/23/2024, 06/25/2024 Left total hip replacement through anterior approach per orthopedics: Dr. Ernie 06/23/2024  Antimicrobials:  Anti-infectives (From admission, onward)    Start     Dose/Rate Route Frequency Ordered Stop   06/27/24 1000  azithromycin  (ZITHROMAX ) tablet 250 mg        250 mg Oral Daily 06/27/24 0847 06/30/24 0959   06/25/24 1400  cefTRIAXone  (ROCEPHIN ) 2 g in sodium chloride  0.9 % 100 mL IVPB        2 g 200 mL/hr over 30 Minutes Intravenous Daily 06/25/24 1306 06/30/24 0959   06/25/24 1400  azithromycin  (ZITHROMAX ) 500 mg in sodium chloride  0.9 % 250 mL IVPB  Status:  Discontinued        500 mg 250 mL/hr over 60 Minutes Intravenous Daily 06/25/24 1306 06/27/24 0847   06/24/24 0100  ceFAZolin  (ANCEF ) IVPB 2g/100 mL premix        2 g 200 mL/hr over 30 Minutes Intravenous Every 6 hours 06/23/24 2010 06/24/24 0706   06/23/24 0815  ceFAZolin  (ANCEF ) IVPB 2g/100 mL premix        2 g 200 mL/hr over 30 Minutes Intravenous On call to O.R. 06/23/24 0722 06/23/24 1658         Subjective: Patient lying in bed.  Stated had multiple stools yesterday.  Denies any significant abdominal pain.  Feels shortness of breath and cough is improving.  Tolerating current diet.  States today awaiting IV team to come place a new IV.  Objective: Vitals:   06/26/24 1321 06/26/24 1323 06/26/24 2205 06/27/24 0613  BP: (!) 88/57 (!) 123/58 (!) 158/87 (!) 144/56  Pulse: 66 64 83 89  Resp: 16 16 17 17   Temp:   98 F (36.7 C)  97.8 F (36.6 C)  TempSrc:   Oral Oral  SpO2: 96% 100% 90% 92%  Weight:      Height:        Intake/Output Summary (Last 24 hours) at 06/27/2024 1214 Last data filed at 06/26/2024 1411 Gross per 24 hour  Intake 240 ml  Output 300 ml  Net -60 ml   Filed Weights   06/22/24 1748  Weight: 62.7 kg    Examination:  General exam: NAD. Respiratory system: Some coarse breath sounds in the bases.  No wheezing, no crackles.  Fair air movement.  Speaking in full sentences.    Cardiovascular system: Regular rate and rhythm no murmurs rubs or gallops.  No JVD.  No pitting lower extremity edema.  Gastrointestinal system: Abdomen is soft, nontender, nondistended, positive bowel sounds.  No rebound.  No guarding.  Central nervous system: Alert and oriented.  Moving extremities spontaneously.  No focal neurological deficits. Extremities: Left lower extremity  with postop dressing in place on the hip.  Skin: No rashes, lesions or ulcers Psychiatry: Judgement and insight appear normal. Mood & affect appropriate.     Data Reviewed: I have personally reviewed following labs and imaging studies  CBC: Recent Labs  Lab 06/22/24 1225 06/23/24 0611 06/24/24 0358 06/25/24 0612 06/26/24 0334 06/27/24 0338  WBC 11.4* 8.7 10.3 18.1* 12.6* 10.3  NEUTROABS 9.1*  --   --   --  9.7* 6.8  HGB 12.9 11.7* 10.1* 9.7* 9.3* 9.6*  HCT 39.5 36.6 31.9* 31.0* 29.2* 30.7*  MCV 96.4 99.2 100.3* 100.6* 101.0* 101.0*  PLT 226.0 182 169 208 208 226    Basic Metabolic Panel: Recent Labs  Lab 06/22/24 1225 06/24/24 0358 06/25/24 0345 06/26/24 0334 06/27/24 0338  NA 139 137 134* 137 139  K 4.3 4.2 4.9 5.1 4.3  CL 99 108 105 109 110  CO2 28 21* 20* 20* 21*  GLUCOSE 100* 227* 143* 106* 94  BUN 31* 36* 40* 36* 37*  CREATININE 1.15 1.43* 1.36* 1.19* 1.11*  CALCIUM  10.3 9.1 9.0 9.3 9.3  MG  --   --   --  2.6*  --     GFR: Estimated Creatinine Clearance: 28.6 mL/min (A) (by C-G formula based on SCr of 1.11  mg/dL (H)).  Liver Function Tests: Recent Labs  Lab 06/22/24 1225  AST 22  ALT 7  ALKPHOS 79  BILITOT 1.2  PROT 7.0  ALBUMIN 4.3    CBG: No results for input(s): GLUCAP in the last 168 hours.   Recent Results (from the past 240 hours)  Urine Culture     Status: None   Collection Time: 06/22/24 12:25 PM   Specimen: Urine  Result Value Ref Range Status   MICRO NUMBER: [id number]  Final   SPECIMEN QUALITY: Adequate  Final   Sample Source NOT GIVEN  Final   STATUS: FINAL  Final   Result: No Growth  Final  Surgical pcr screen     Status: None   Collection Time: 06/23/24  5:58 AM   Specimen: Nasal Mucosa; Nasal Swab  Result Value Ref Range Status   MRSA, PCR NEGATIVE NEGATIVE Final   Staphylococcus aureus NEGATIVE NEGATIVE Final    Comment: (NOTE) The Xpert SA Assay (FDA approved for NASAL specimens in patients 20 years of age and older), is one component of a comprehensive surveillance program. It is not intended to diagnose infection nor to guide or monitor treatment. Performed at [facility], [address], [place], [place] [address]   Urine Culture     Status: Abnormal (Preliminary result)   Collection Time: 06/25/24  1:18 PM   Specimen: Urine, Catheterized  Result Value Ref Range Status   Specimen Description   Final    URINE, CATHETERIZED Performed at [facility], [address] [place], [place] [address]    Special Requests   Final    NONE Performed at [facility], [address] [place], [place] [address]    Culture (A)  Final    20,000 COLONIES/mL PSEUDOMONAS AERUGINOSA SUSCEPTIBILITIES TO FOLLOW Performed at [facility], [address] [place], [place] [address]    Report Status PENDING  Incomplete         Radiology Studies: No results found.       Scheduled Meds:  acetaminophen   1,000 mg Oral Q6H   aspirin   81 mg Oral BID   azithromycin   250 mg Oral Daily  cycloSPORINE   1 drop Both Eyes BID   erythromycin   1 Application Both Eyes QHS   febuxostat   40 mg Oral Daily   feeding supplement  237 mL Oral BID BM   furosemide   20 mg Oral Daily   guaiFENesin   600 mg Oral BID   levothyroxine   50 mcg Oral QAC breakfast   metoprolol  succinate  100 mg Oral Daily   pantoprazole   40 mg Oral BID   polyethylene glycol  17 g Oral BID   senna  2 tablet Oral QHS   Continuous Infusions:  cefTRIAXone  (ROCEPHIN )  IV 2 g (06/27/24 0956)     LOS: 4 days    Time spent: 40 minutes    Toribio Hummer, MD [facility]   To contact the attending provider between 7A-7P or the covering provider during after hours 7P-7A, please log into the web site [email or web address] and access using universal [place] password for that web site. If you do not have the password, please call the hospital operator.  06/27/2024, 12:14 PM

## 2024-06-27 NOTE — Progress Notes (Signed)
 Physical Therapy Treatment Patient Details Name: Christina Mckinney MRN: [id number] DOB: 10-31-1932 Today's Date: 06/27/2024   History of Present Illness 89 y.o. female who had a fall on 06/20/24, was admitted 06/22/24 with L femoral neck fx, s/p AA-THA 06/23/24. PMH: COPD, gout, PE, CHF, anxiety, HTN, tremor, BPPV.    PT Comments  Pt requesting assist to bathroom however will urinary urgency/incontinence so provided BSC.  Pt assisted with changing undergarment and then ambulated in hallway.  Pt very pleasant and thankful for assist stating she is used to being independent.  Current d/c plan for SNF remains appropriate.      If plan is discharge home, recommend the following: A little help with walking and/or transfers;A little help with bathing/dressing/bathroom;Assistance with cooking/housework;Help with stairs or ramp for entrance   Can travel by private vehicle     Yes  Equipment Recommendations       Recommendations for Other Services       Precautions / Restrictions Precautions Precautions: Fall Recall of Precautions/Restrictions: Intact Restrictions LLE Weight Bearing Per Provider Order: Weight bearing as tolerated     Mobility  Bed Mobility Overal bed mobility: Needs Assistance Bed Mobility: Supine to Sit, Sit to Supine     Supine to sit: HOB elevated, Min assist, Used rails Sit to supine: Min assist, HOB elevated   General bed mobility comments: verbal cues for self assist, pt required assist for L LE due to pain    Transfers Overall transfer level: Needs assistance Equipment used: Rolling walker (2 wheels) Transfers: Sit to/from Stand Sit to Stand: Min assist           General transfer comment: verbal cues for hand placement, assist to rise    Ambulation/Gait Ambulation/Gait assistance: Contact guard assist Gait Distance (Feet): 100 Feet Assistive device: Rolling walker (2 wheels) Gait Pattern/deviations: Step-to pattern, Decreased stance time - left,  Antalgic Gait velocity: decr     General Gait Details: verbal cues for step length and posture; distance limited by pain and fatigue   Stairs             Wheelchair Mobility     Tilt Bed    Modified Rankin (Stroke Patients Only)       Balance                                            Communication Communication Communication: Impaired Factors Affecting Communication: Hearing impaired  Cognition Arousal: Alert Behavior During Therapy: WFL for tasks assessed/performed   PT - Cognitive impairments: No apparent impairments                         Following commands: Impaired Following commands impaired: Follows one step commands with increased time    Cueing Cueing Techniques: Verbal cues, Tactile cues  Exercises      General Comments        Pertinent Vitals/Pain Pain Assessment Pain Assessment: 0-10 Pain Score: 4  Pain Location: L hip Pain Descriptors / Indicators: Sore Pain Intervention(s): Repositioned, Monitored during session    Home Living                          Prior Function            PT Goals (current goals can now be found in the care  plan section) Progress towards PT goals: Progressing toward goals    Frequency    Min 3X/week      PT Plan      Co-evaluation              AM-PAC PT 6 Clicks Mobility   Outcome Measure  Help needed turning from your back to your side while in a flat bed without using bedrails?: A Little Help needed moving from lying on your back to sitting on the side of a flat bed without using bedrails?: A Little Help needed moving to and from a bed to a chair (including a wheelchair)?: A Little Help needed standing up from a chair using your arms (e.g., wheelchair or bedside chair)?: A Little Help needed to walk in hospital room?: A Little Help needed climbing 3-5 steps with a railing? : A Little 6 Click Score: 18    End of Session Equipment Utilized  During Treatment: Gait belt Activity Tolerance: Patient tolerated treatment well Patient left: in bed;with call bell/phone within reach;with bed alarm set   PT Visit Diagnosis: Difficulty in walking, not elsewhere classified (R26.2);Pain Pain - Right/Left: Left Pain - part of body: Hip     Time: 1553-1610 PT Time Calculation (min) (ACUTE ONLY): 17 min  Charges:    $Gait Training: 8-22 mins PT General Charges $$ ACUTE PT VISIT: 1 Visit                    Tari KLEIN, DPT Physical Therapist Acute Rehabilitation Services Office: [phone]    Tari CROME Payson 06/27/2024, 4:47 PM

## 2024-06-27 NOTE — Plan of Care (Signed)
   Problem: Clinical Measurements: Goal: Diagnostic test results will improve Outcome: Progressing

## 2024-06-28 DIAGNOSIS — I471 Supraventricular tachycardia, unspecified: Secondary | ICD-10-CM | POA: Diagnosis not present

## 2024-06-28 DIAGNOSIS — J189 Pneumonia, unspecified organism: Secondary | ICD-10-CM | POA: Diagnosis not present

## 2024-06-28 DIAGNOSIS — I10 Essential (primary) hypertension: Secondary | ICD-10-CM | POA: Diagnosis not present

## 2024-06-28 DIAGNOSIS — S72002P Fracture of unspecified part of neck of left femur, subsequent encounter for closed fracture with malunion: Secondary | ICD-10-CM | POA: Diagnosis not present

## 2024-06-28 LAB — BASIC METABOLIC PANEL WITH GFR
Anion gap: 11 (ref 5–15)
BUN: 27 mg/dL — ABNORMAL HIGH (ref 8–23)
CO2: 22 mmol/L (ref 22–32)
Calcium: 9.5 mg/dL (ref 8.9–10.3)
Chloride: 108 mmol/L (ref 98–111)
Creatinine, Ser: 0.94 mg/dL (ref 0.44–1.00)
GFR, Estimated: 58 mL/min — ABNORMAL LOW (ref >60)
Glucose, Bld: 90 mg/dL (ref 70–99)
Potassium: 4.6 mmol/L (ref 3.5–5.1)
Sodium: 141 mmol/L (ref 135–145)

## 2024-06-28 LAB — CBC
HCT: 33.1 % — ABNORMAL LOW (ref 36.0–46.0)
Hemoglobin: 10.4 g/dL — ABNORMAL LOW (ref 12.0–15.0)
MCH: 31.4 pg (ref 26.0–34.0)
MCHC: 31.4 g/dL (ref 30.0–36.0)
MCV: 100 fL (ref 80.0–100.0)
Platelets: 285 K/uL (ref 150–400)
RBC: 3.31 MIL/uL — ABNORMAL LOW (ref 3.87–5.11)
RDW: 14.4 % (ref 11.5–15.5)
WBC: 10.7 K/uL — ABNORMAL HIGH (ref 4.0–10.5)
nRBC: 0.2 % (ref 0.0–0.2)

## 2024-06-28 MED ORDER — HYOSCYAMINE SULFATE 0.125 MG PO TBDP
0.1250 mg | ORAL_TABLET | Freq: Four times a day (QID) | ORAL | Status: DC | PRN
  Administered 2024-06-29: 0.125 mg via SUBLINGUAL
  Filled 2024-06-28 (×2): qty 1

## 2024-06-28 NOTE — Progress Notes (Addendum)
 Physical Therapy Treatment Patient Details Name: Christina Mckinney MRN: [id number] DOB: 12-Jan-1932 Today's Date: 06/28/2024   History of Present Illness 89 y.o. female fell on 06/20/24, was admitted 06/22/24 with L femoral neck fx, s/p AA-THA 06/23/24. PMH: COPD, gout, PE, CHF, anxiety, HTN, tremor, BPPV.    PT Comments  Pt in recliner on arrival and requesting assist to [facility] and then back to bed.  Pt assisted with using BSC and then agreeable to ambulate prior to returning to bed.  Pt with probable d/c to SNF tomorrow per notes.    If plan is discharge home, recommend the following: A little help with walking and/or transfers;A little help with bathing/dressing/bathroom;Assistance with cooking/housework;Help with stairs or ramp for entrance   Can travel by private vehicle     Yes  Equipment Recommendations  None recommended by PT    Recommendations for Other Services       Precautions / Restrictions Precautions Precautions: Fall Recall of Precautions/Restrictions: Intact Restrictions LLE Weight Bearing Per Provider Order: Weight bearing as tolerated     Mobility  Bed Mobility   Bed Mobility: Sit to Supine       Sit to supine: Min assist, HOB elevated   General bed mobility comments: min A to manage L LE    Transfers Overall transfer level: Needs assistance Equipment used: Rolling walker (2 wheels) Transfers: Sit to/from Stand Sit to Stand: Min assist, Contact guard assist           General transfer comment: verbal cues for UE and LE positioning; assist to rise and stabilize from [facility]    Ambulation/Gait Ambulation/Gait assistance: Contact guard assist Gait Distance (Feet): 100 Feet Assistive device: Rolling walker (2 wheels) Gait Pattern/deviations: Step-to pattern, Decreased stance time - left, Antalgic Gait velocity: decr     General Gait Details: verbal cues for step length and posture; distance limited by fatigue (pt reports pain better since pain meds this  morning)   Stairs             Wheelchair Mobility     Tilt Bed    Modified Rankin (Stroke Patients Only)       Balance                                            Communication Communication Communication: Impaired Factors Affecting Communication: Hearing impaired  Cognition Arousal: Alert Behavior During Therapy: WFL for tasks assessed/performed   PT - Cognitive impairments: No apparent impairments                         Following commands: Impaired Following commands impaired: Follows one step commands with increased time    Cueing Cueing Techniques: Verbal cues, Tactile cues  Exercises      General Comments        Pertinent Vitals/Pain Pain Assessment Pain Assessment: 0-10 Pain Score: 3  Pain Location: L hip Pain Descriptors / Indicators: Sore Pain Intervention(s): Repositioned, Monitored during session    Home Living                          Prior Function            PT Goals (current goals can now be found in the care plan section) Progress towards PT goals: Progressing toward goals    Frequency  Min 3X/week      PT Plan      Co-evaluation              AM-PAC PT 6 Clicks Mobility   Outcome Measure  Help needed turning from your back to your side while in a flat bed without using bedrails?: A Little Help needed moving from lying on your back to sitting on the side of a flat bed without using bedrails?: A Little Help needed moving to and from a bed to a chair (including a wheelchair)?: A Little Help needed standing up from a chair using your arms (e.g., wheelchair or bedside chair)?: A Little Help needed to walk in hospital room?: A Little Help needed climbing 3-5 steps with a railing? : A Little 6 Click Score: 18    End of Session Equipment Utilized During Treatment: Gait belt Activity Tolerance: Patient tolerated treatment well Patient left: in bed;with call bell/phone within  reach;with bed alarm set   PT Visit Diagnosis: Difficulty in walking, not elsewhere classified (R26.2);Pain Pain - Right/Left: Left Pain - part of body: Hip     Time: 1206-1220 PT Time Calculation (min) (ACUTE ONLY): 14 min  Charges:    $Gait Training: 8-22 mins PT General Charges $$ ACUTE PT VISIT: 1 Visit                    Tari KLEIN, DPT Physical Therapist Acute Rehabilitation Services Office: [phone]  Tari CROME [place] 06/28/2024, 4:35 PM

## 2024-06-28 NOTE — Progress Notes (Signed)
 PROGRESS NOTE    Christina Mckinney  FMW:[id number] DOB: February 04, 1932 DOA: 06/22/2024 PCP: Domenica Harlene LABOR, MD    Chief Complaint  Patient presents with   Hip Pain    Brief Narrative:  89 year old with past medical history significant for CAD, heart failure preserved ejection fraction, known SVT, DVT/PE, essential tremor, GERD, gout, macular degeneration, osteoarthritis, hyperlipidemia, hypertension, hypothyroidism, anemia, anxiety, depression, prediabetes, CKD stage IIIb, COPD, BPPV who presented with left hip pain since after a mechanical fall that happened on Saturday.  Patient was going to sit in her recliner, but somehow she missed the recliner and fell on the floor instead landing on her left hip.  Since after the fall she was able to walk using a walker but has been having severe pain.  They report stable chronic shortness of breath.   X-ray obtained by PCP showed impacted left femoral neck fracture and patient sent to the ED.   Assessment & Plan:   Principal Problem:   Hip fracture (HCC) Active Problems:   COPD (chronic obstructive pulmonary disease) (HCC)   Hypoxia   Fall at home, initial encounter   CAD (coronary artery disease)   S/P total left hip arthroplasty   SVT (supraventricular tachycardia) (HCC)   Chronic heart failure with preserved ejection fraction (HCC)   Essential hypertension   CAP (community acquired pneumonia)   Leukocytosis   #1 left hip femoral neck fracture, impacted -Secondary to mechanical fall. - Patient seen in consultation by orthopedics and patient status post left total hip replacement on 06/23/2024. - Weightbearing as tolerated. - PT/OT. - Pain management and DVT prophylaxis per orthopedics.  2.  Recent fall -CT head unremarkable.  3.  Mild hypoxia -Patient was not hypoxic initially on arrival to the ED but noted to desat to 88 to 92% on room air and placed on 2 L O2. - Felt hypoxia likely secondary to opioids as patient noted to have  received Percocet several hours prior to that. - Chest x-ray done on admission with increased pulmonary congestion, no frank interstitial edema pleural effusion, diffuse chronic bronchial wall thickening. - Hypoxia improved patient with sats of 98% on room air.   - Continue antibiotics for CAP.  4.  Leukocytosis/CAP - Patient noted with significant leukocytosis with complaints of congestion and cough. - Chest x-ray done on 06/25/2024, with patchy airspace opacity in the left lung base which may represent atelectasis or developing bronchopneumonia. - Leukocytosis trended down since starting empiric IV antibiotics.  - Urine pneumococcus antigen negative.  - Urine Legionella antigen negative.  - Continue empiric IV Rocephin  and oral azithromycin . - Continue Mucinex . -Could likely transition to Vantin  on discharge to complete course of antibiotic treatment. - Supportive care.  5.  History of SVT - Metoprolol .  6.  Hypertension -BP was initially soft but has improved.   - Continue metoprolol  and Lasix . - Continue to hold Cozaar .   7.  GERD - PPI.    8.  Hypothyroidism - Synthroid .   9.  COPD -Stable. -DuoNebs as needed.  10.  CAD/HFpEF -Stable. -Lasix  initially resumed but held due to soft blood pressure. - Continue half home dose Lasix  of 20 mg daily.   11.  Postop acute blood loss anemia -H&H stable. - Anemia panel with iron level of 18, TIBC of 217, ferritin of 259, folate of 9.6, vitamin B12 of 1122.  - Hemoglobin currently stable at 10.4. -Follow H&H. -Transfusion threshold hemoglobin < 8.  12.  Constipation -Patient with complaints of constipation,  no bowel movement in approximately 5 days. - Patient was on MiraLAX  twice daily, Senokot-S twice daily and received sorbitol  every 3 hours x 2 doses on 06/26/2024 with good results.   - MiraLAX  changed to daily as needed.   - Resume Senokot as nightly. - Dulcolax suppository as needed.   DVT prophylaxis: Aspirin  Code  Status: Full Family Communication: Updated patient.  No family at bedside.  Disposition: SNF when clinically improved and medically stable and cleared by orthopedics hopefully in the next 24 hours.   Status is: Inpatient Remains inpatient appropriate because: Severity of illness   Consultants:  Orthopedics: Dr. Ernie 06/23/2024  Procedures:  CT head 06/23/2024 Left hip plain films of the pelvis 06/22/2024 Plain films of the pelvis 06/23/2024 Chest x-ray 06/23/2024, 06/25/2024 Left total hip replacement through anterior approach per orthopedics: Dr. Ernie 06/23/2024  Antimicrobials:  Anti-infectives (From admission, onward)    Start     Dose/Rate Route Frequency Ordered Stop   06/27/24 1000  azithromycin  (ZITHROMAX ) tablet 250 mg        250 mg Oral Daily 06/27/24 0847 06/30/24 0959   06/25/24 1400  cefTRIAXone  (ROCEPHIN ) 2 g in sodium chloride  0.9 % 100 mL IVPB        2 g 200 mL/hr over 30 Minutes Intravenous Daily 06/25/24 1306 06/30/24 0959   06/25/24 1400  azithromycin  (ZITHROMAX ) 500 mg in sodium chloride  0.9 % 250 mL IVPB  Status:  Discontinued        500 mg 250 mL/hr over 60 Minutes Intravenous Daily 06/25/24 1306 06/27/24 0847   06/24/24 0100  ceFAZolin  (ANCEF ) IVPB 2g/100 mL premix        2 g 200 mL/hr over 30 Minutes Intravenous Every 6 hours 06/23/24 2010 06/24/24 0706   06/23/24 0815  ceFAZolin  (ANCEF ) IVPB 2g/100 mL premix        2 g 200 mL/hr over 30 Minutes Intravenous On call to O.R. 06/23/24 0722 06/23/24 1658         Subjective: Sitting up in recliner.  States she is feeling much better today than over the past few days.  Shortness of breath improving.  Still with cough but slowly improving.  No abdominal pain.  Some complaints of bladder spasms intermittently.   Objective: Vitals:   06/27/24 0613 06/27/24 1428 06/28/24 0109 06/28/24 0601  BP: (!) 144/56 (!) 142/58 (!) 141/63 (!) 160/57  Pulse: 89 75 84 93  Resp: 17 18 16 16   Temp: 97.8 F (36.6 C) 97.8 F  (36.6 C) 98.2 F (36.8 C) 98.1 F (36.7 C)  TempSrc: Oral  Oral Oral  SpO2: 92% 96% 97% 98%  Weight:      Height:        Intake/Output Summary (Last 24 hours) at 06/28/2024 1122 Last data filed at 06/28/2024 1100 Gross per 24 hour  Intake 720 ml  Output 1800 ml  Net -1080 ml   Filed Weights   06/22/24 1748  Weight: 62.7 kg    Examination:  General exam: NAD. Respiratory system: Decreased coarse breath sounds in the bases.  No wheezing, no crackles.  Fair air movement.  Speaking in full sentences.    Cardiovascular system: RRR no murmurs rubs or gallops.  No JVD.  No pitting lower extremity edema.  Gastrointestinal system: Abdomen soft, nontender, nondistended, positive bowel sounds.  No rebound.  No guarding. Central nervous system: Alert and oriented.  Moving extremities spontaneously.  No focal neurological deficits. Extremities: Left lower extremity with postop dressing in place  on the hip.  Skin: No rashes, lesions or ulcers Psychiatry: Judgement and insight appear normal. Mood & affect appropriate.     Data Reviewed: I have personally reviewed following labs and imaging studies  CBC: Recent Labs  Lab 06/22/24 1225 06/23/24 0611 06/24/24 0358 06/25/24 0612 06/26/24 0334 06/27/24 0338 06/28/24 0301  WBC 11.4*   < > 10.3 18.1* 12.6* 10.3 10.7*  NEUTROABS 9.1*  --   --   --  9.7* 6.8  --   HGB 12.9   < > 10.1* 9.7* 9.3* 9.6* 10.4*  HCT 39.5   < > 31.9* 31.0* 29.2* 30.7* 33.1*  MCV 96.4   < > 100.3* 100.6* 101.0* 101.0* 100.0  PLT 226.0   < > 169 208 208 226 285   < > = values in this interval not displayed.    Basic Metabolic Panel: Recent Labs  Lab 06/24/24 0358 06/25/24 0345 06/26/24 0334 06/27/24 0338 06/28/24 0301  NA 137 134* 137 139 141  K 4.2 4.9 5.1 4.3 4.6  CL 108 105 109 110 108  CO2 21* 20* 20* 21* 22  GLUCOSE 227* 143* 106* 94 90  BUN 36* 40* 36* 37* 27*  CREATININE 1.43* 1.36* 1.19* 1.11* 0.94  CALCIUM  9.1 9.0 9.3 9.3 9.5  MG  --    --  2.6*  --   --     GFR: Estimated Creatinine Clearance: 33.8 mL/min (by C-G formula based on SCr of 0.94 mg/dL).  Liver Function Tests: Recent Labs  Lab 06/22/24 1225  AST 22  ALT 7  ALKPHOS 79  BILITOT 1.2  PROT 7.0  ALBUMIN 4.3    CBG: No results for input(s): GLUCAP in the last 168 hours.   Recent Results (from the past 240 hours)  Urine Culture     Status: None   Collection Time: 06/22/24 12:25 PM   Specimen: Urine  Result Value Ref Range Status   MICRO NUMBER: [id number]  Final   SPECIMEN QUALITY: Adequate  Final   Sample Source NOT GIVEN  Final   STATUS: FINAL  Final   Result: No Growth  Final  Surgical pcr screen     Status: None   Collection Time: 06/23/24  5:58 AM   Specimen: Nasal Mucosa; Nasal Swab  Result Value Ref Range Status   MRSA, PCR NEGATIVE NEGATIVE Final   Staphylococcus aureus NEGATIVE NEGATIVE Final    Comment: (NOTE) The Xpert SA Assay (FDA approved for NASAL specimens in patients 22 years of age and older), is one component of a comprehensive surveillance program. It is not intended to diagnose infection nor to guide or monitor treatment. Performed at [facility], [address] [place], [place] [address]   Urine Culture     Status: Abnormal   Collection Time: 06/25/24  1:18 PM   Specimen: Urine, Catheterized  Result Value Ref Range Status   Specimen Description   Final    URINE, CATHETERIZED Performed at [facility], [address], [place], [place] [address]    Special Requests   Final    NONE Performed at [facility], [address] [place], [place] [address]    Culture 20,000 COLONIES/mL PSEUDOMONAS AERUGINOSA (A)  Final   Report Status 06/27/2024 FINAL  Final   Organism ID, Bacteria PSEUDOMONAS AERUGINOSA (A)  Final      Susceptibility   Pseudomonas aeruginosa - MIC*    MEROPENEM <=0.25 SENSITIVE Sensitive     CIPROFLOXACIN  0.25 SENSITIVE  Sensitive      IMIPENEM 2 SENSITIVE Sensitive     PIP/TAZO Value in next row Sensitive ug/mL     8 SENSITIVEThis is a modified FDA-approved test that has been validated and its performance characteristics determined by the reporting laboratory.  This laboratory is certified under the Clinical Laboratory Improvement Amendments CLIA as qualified to perform high complexity clinical laboratory testing.    CEFEPIME Value in next row Sensitive      8 SENSITIVEThis is a modified FDA-approved test that has been validated and its performance characteristics determined by the reporting laboratory.  This laboratory is certified under the Clinical Laboratory Improvement Amendments CLIA as qualified to perform high complexity clinical laboratory testing.    CEFTAZIDIME/AVIBACTAM Value in next row Sensitive ug/mL     8 SENSITIVEThis is a modified FDA-approved test that has been validated and its performance characteristics determined by the reporting laboratory.  This laboratory is certified under the Clinical Laboratory Improvement Amendments CLIA as qualified to perform high complexity clinical laboratory testing.    CEFTOLOZANE/TAZOBACTAM Value in next row Sensitive ug/mL     8 SENSITIVEThis is a modified FDA-approved test that has been validated and its performance characteristics determined by the reporting laboratory.  This laboratory is certified under the Clinical Laboratory Improvement Amendments CLIA as qualified to perform high complexity clinical laboratory testing.    TOBRAMYCIN Value in next row Sensitive      8 SENSITIVEThis is a modified FDA-approved test that has been validated and its performance characteristics determined by the reporting laboratory.  This laboratory is certified under the Clinical Laboratory Improvement Amendments CLIA as qualified to perform high complexity clinical laboratory testing.    CEFTAZIDIME Value in next row Sensitive      8 SENSITIVEThis is a modified FDA-approved test that has been  validated and its performance characteristics determined by the reporting laboratory.  This laboratory is certified under the Clinical Laboratory Improvement Amendments CLIA as qualified to perform high complexity clinical laboratory testing.    * 20,000 COLONIES/mL PSEUDOMONAS AERUGINOSA         Radiology Studies: No results found.       Scheduled Meds:  aspirin   81 mg Oral BID   azithromycin   250 mg Oral Daily   cycloSPORINE   1 drop Both Eyes BID   erythromycin   1 Application Both Eyes QHS   febuxostat   40 mg Oral Daily   feeding supplement  237 mL Oral BID BM   furosemide   20 mg Oral Daily   guaiFENesin   600 mg Oral BID   levothyroxine   50 mcg Oral QAC breakfast   metoprolol  succinate  100 mg Oral Daily   pantoprazole   40 mg Oral BID   senna  2 tablet Oral QHS   Continuous Infusions:  cefTRIAXone  (ROCEPHIN )  IV 2 g (06/28/24 0854)     LOS: 5 days    Time spent: 40 minutes    Toribio Hummer, MD [facility]   To contact the attending provider between 7A-7P or the covering provider during after hours 7P-7A, please log into the web site [email or web address] and access using universal [place] password for that web site. If you do not have the password, please call the hospital operator.  06/28/2024, 11:22 AM

## 2024-06-28 NOTE — Progress Notes (Signed)
 Occupational Therapy Treatment Patient Details Name: Christina Mckinney MRN: [id number] DOB: [birth year] Today's Date: 06/28/2024   History of present illness 89 y.o. female fell on 06/20/24, was admitted 06/22/24 with L femoral neck fx, s/p AA-THA 06/23/24. PMH: COPD, gout, PE, CHF, anxiety, HTN, tremor, BPPV.   OT comments  Patient seen in order to advance funcitonal mobility, activity tolerance, and ADL independence. Patient reporting increased pain, therefore RN providing pain meds in session. Patient requiring min A for bed mobility, and able to complete incremental scoots to [facility] after taking medications. Discharge remains appropriate, OT will continue to follow.       If plan is discharge home, recommend the following:  A lot of help with walking and/or transfers;A lot of help with bathing/dressing/bathroom;Assistance with cooking/housework;Direct supervision/assist for medications management;Direct supervision/assist for financial management;Assist for transportation;Help with stairs or ramp for entrance;Supervision due to cognitive status   Equipment Recommendations  None recommended by OT    Recommendations for Other Services      Precautions / Restrictions Precautions Precautions: Fall Recall of Precautions/Restrictions: Intact Precaution/Restrictions Comments: fell just prior to admission, denies other falls in past 6 months Restrictions Weight Bearing Restrictions Per Provider Order: Yes LLE Weight Bearing Per Provider Order: Weight bearing as tolerated Other Position/Activity Restrictions: LLE WBAT       Mobility Bed Mobility Overal bed mobility: Needs Assistance Bed Mobility: Supine to Sit, Sit to Supine     Supine to sit: HOB elevated, Min assist, Used rails Sit to supine: Min assist, HOB elevated   General bed mobility comments: min A to manage LLE    Transfers Overall transfer level: Needs assistance                 General transfer comment: deferred due to  pain     Balance Overall balance assessment: Needs assistance, History of Falls Sitting-balance support: Feet supported Sitting balance-Leahy Scale: Good                                     ADL either performed or assessed with clinical judgement   ADL Overall ADL's : Needs assistance/impaired     Grooming: Wash/dry hands;Wash/dry face;Set up;Bed level                               Functional mobility during ADLs: Minimal assistance;Contact guard assist;Cueing for sequencing;Rolling walker (2 wheels);Cueing for safety General ADL Comments: Patient seen in order to advance funcitonal mobility, activity tolerance, and ADL independence. Patient reporting increased pain, therefore RN providing pain meds in session. Patient requiring min A for bed mobility, and able to complete incremental scoots to [facility] after taking medications. Discharge remains appropriate, OT will continue to follow.    Extremity/Trunk Assessment              Occupational psychologist Communication: Impaired Factors Affecting Communication: Hearing impaired   Cognition Arousal: Alert Behavior During Therapy: WFL for tasks assessed/performed Cognition: No apparent impairments             OT - Cognition Comments: cognition overall WFL;                 Following commands: Impaired Following commands impaired: Follows one step commands with increased time  Cueing   Cueing Techniques: Verbal cues, Tactile cues  Exercises      Shoulder Instructions       General Comments      Pertinent Vitals/ Pain       Pain Assessment Pain Assessment: Faces Faces Pain Scale: Hurts even more Pain Location: L hip Pain Descriptors / Indicators: Sore Pain Intervention(s): Limited activity within patient's tolerance, Monitored during session, Repositioned, Patient requesting pain meds-RN notified, RN gave pain meds during  session  Home Living                                          Prior Functioning/Environment              Frequency  Min 3X/week        Progress Toward Goals  OT Goals(current goals can now be found in the care plan section)  Progress towards OT goals: Progressing toward goals  Acute Rehab OT Goals OT Goal Formulation: With patient Time For Goal Achievement: 07/08/24 Potential to Achieve Goals: Good  Plan      Co-evaluation                 AM-PAC OT 6 Clicks Daily Activity     Outcome Measure   Help from another person eating meals?: None Help from another person taking care of personal grooming?: A Little Help from another person toileting, which includes using toliet, bedpan, or urinal?: A Lot Help from another person bathing (including washing, rinsing, drying)?: A Lot Help from another person to put on and taking off regular upper body clothing?: A Little Help from another person to put on and taking off regular lower body clothing?: A Lot 6 Click Score: 16    End of Session    OT Visit Diagnosis: History of falling (Z91.81);Other abnormalities of gait and mobility (R26.89);Unsteadiness on feet (R26.81);Pain Pain - Right/Left: Left Pain - part of body: Hip   Activity Tolerance Patient tolerated treatment well;No increased pain   Patient Left in bed;with call bell/phone within reach;with bed alarm set;with SCD's reapplied   Nurse Communication Mobility status        Time: 9159-9140 OT Time Calculation (min): 19 min  Charges: OT General Charges $OT Visit: 1 Visit OT Treatments $Self Care/Home Management : 8-22 mins  Ronal Gift E. Shontelle Muska, OTR/L Acute Rehabilitation Services [phone]   Ronal Gift Salt 06/28/2024, 9:08 AM

## 2024-06-28 NOTE — Plan of Care (Signed)
   Problem: Clinical Measurements: Goal: Respiratory complications will improve Outcome: Progressing

## 2024-06-29 DIAGNOSIS — E039 Hypothyroidism, unspecified: Secondary | ICD-10-CM | POA: Diagnosis not present

## 2024-06-29 DIAGNOSIS — S72002A Fracture of unspecified part of neck of left femur, initial encounter for closed fracture: Secondary | ICD-10-CM | POA: Diagnosis not present

## 2024-06-29 DIAGNOSIS — Z96642 Presence of left artificial hip joint: Secondary | ICD-10-CM

## 2024-06-29 DIAGNOSIS — J449 Chronic obstructive pulmonary disease, unspecified: Secondary | ICD-10-CM | POA: Diagnosis not present

## 2024-06-29 DIAGNOSIS — M25552 Pain in left hip: Secondary | ICD-10-CM | POA: Diagnosis not present

## 2024-06-29 DIAGNOSIS — D72829 Elevated white blood cell count, unspecified: Secondary | ICD-10-CM | POA: Diagnosis not present

## 2024-06-29 DIAGNOSIS — I10 Essential (primary) hypertension: Secondary | ICD-10-CM | POA: Diagnosis not present

## 2024-06-29 DIAGNOSIS — R0602 Shortness of breath: Secondary | ICD-10-CM | POA: Diagnosis not present

## 2024-06-29 DIAGNOSIS — M109 Gout, unspecified: Secondary | ICD-10-CM | POA: Diagnosis not present

## 2024-06-29 DIAGNOSIS — R112 Nausea with vomiting, unspecified: Secondary | ICD-10-CM | POA: Diagnosis not present

## 2024-06-29 DIAGNOSIS — I2585 Chronic coronary microvascular dysfunction: Secondary | ICD-10-CM | POA: Diagnosis not present

## 2024-06-29 DIAGNOSIS — I2510 Atherosclerotic heart disease of native coronary artery without angina pectoris: Secondary | ICD-10-CM | POA: Diagnosis not present

## 2024-06-29 DIAGNOSIS — S72002D Fracture of unspecified part of neck of left femur, subsequent encounter for closed fracture with routine healing: Secondary | ICD-10-CM | POA: Diagnosis not present

## 2024-06-29 DIAGNOSIS — I509 Heart failure, unspecified: Secondary | ICD-10-CM | POA: Diagnosis not present

## 2024-06-29 DIAGNOSIS — S72002P Fracture of unspecified part of neck of left femur, subsequent encounter for closed fracture with malunion: Secondary | ICD-10-CM | POA: Diagnosis not present

## 2024-06-29 DIAGNOSIS — U071 COVID-19: Secondary | ICD-10-CM | POA: Diagnosis not present

## 2024-06-29 DIAGNOSIS — W19XXXD Unspecified fall, subsequent encounter: Secondary | ICD-10-CM | POA: Diagnosis not present

## 2024-06-29 DIAGNOSIS — W19XXXA Unspecified fall, initial encounter: Secondary | ICD-10-CM | POA: Diagnosis not present

## 2024-06-29 DIAGNOSIS — I471 Supraventricular tachycardia, unspecified: Secondary | ICD-10-CM | POA: Diagnosis not present

## 2024-06-29 DIAGNOSIS — K219 Gastro-esophageal reflux disease without esophagitis: Secondary | ICD-10-CM | POA: Diagnosis not present

## 2024-06-29 DIAGNOSIS — E1122 Type 2 diabetes mellitus with diabetic chronic kidney disease: Secondary | ICD-10-CM | POA: Diagnosis not present

## 2024-06-29 DIAGNOSIS — R143 Flatulence: Secondary | ICD-10-CM | POA: Diagnosis not present

## 2024-06-29 DIAGNOSIS — I5032 Chronic diastolic (congestive) heart failure: Secondary | ICD-10-CM | POA: Diagnosis not present

## 2024-06-29 DIAGNOSIS — N3289 Other specified disorders of bladder: Secondary | ICD-10-CM | POA: Diagnosis not present

## 2024-06-29 DIAGNOSIS — I129 Hypertensive chronic kidney disease with stage 1 through stage 4 chronic kidney disease, or unspecified chronic kidney disease: Secondary | ICD-10-CM | POA: Diagnosis not present

## 2024-06-29 DIAGNOSIS — R0902 Hypoxemia: Secondary | ICD-10-CM | POA: Diagnosis not present

## 2024-06-29 DIAGNOSIS — J189 Pneumonia, unspecified organism: Secondary | ICD-10-CM | POA: Diagnosis not present

## 2024-06-29 DIAGNOSIS — Z471 Aftercare following joint replacement surgery: Secondary | ICD-10-CM | POA: Diagnosis not present

## 2024-06-29 DIAGNOSIS — K5900 Constipation, unspecified: Secondary | ICD-10-CM | POA: Diagnosis not present

## 2024-06-29 DIAGNOSIS — R059 Cough, unspecified: Secondary | ICD-10-CM | POA: Diagnosis not present

## 2024-06-29 DIAGNOSIS — N1832 Chronic kidney disease, stage 3b: Secondary | ICD-10-CM | POA: Diagnosis not present

## 2024-06-29 DIAGNOSIS — R002 Palpitations: Secondary | ICD-10-CM | POA: Diagnosis not present

## 2024-06-29 DIAGNOSIS — N390 Urinary tract infection, site not specified: Secondary | ICD-10-CM | POA: Diagnosis not present

## 2024-06-29 DIAGNOSIS — Y92009 Unspecified place in unspecified non-institutional (private) residence as the place of occurrence of the external cause: Secondary | ICD-10-CM | POA: Diagnosis not present

## 2024-06-29 DIAGNOSIS — J1282 Pneumonia due to coronavirus disease 2019: Secondary | ICD-10-CM | POA: Diagnosis not present

## 2024-06-29 LAB — CBC
HCT: 32.2 % — ABNORMAL LOW (ref 36.0–46.0)
Hemoglobin: 10 g/dL — ABNORMAL LOW (ref 12.0–15.0)
MCH: 30.6 pg (ref 26.0–34.0)
MCHC: 31.1 g/dL (ref 30.0–36.0)
MCV: 98.5 fL (ref 80.0–100.0)
Platelets: 299 K/uL (ref 150–400)
RBC: 3.27 MIL/uL — ABNORMAL LOW (ref 3.87–5.11)
RDW: 14.3 % (ref 11.5–15.5)
WBC: 10.6 K/uL — ABNORMAL HIGH (ref 4.0–10.5)
nRBC: 0 % (ref 0.0–0.2)

## 2024-06-29 LAB — BASIC METABOLIC PANEL WITH GFR
Anion gap: 10 (ref 5–15)
BUN: 23 mg/dL (ref 8–23)
CO2: 23 mmol/L (ref 22–32)
Calcium: 9.2 mg/dL (ref 8.9–10.3)
Chloride: 106 mmol/L (ref 98–111)
Creatinine, Ser: 0.95 mg/dL (ref 0.44–1.00)
GFR, Estimated: 57 mL/min — ABNORMAL LOW (ref >60)
Glucose, Bld: 100 mg/dL — ABNORMAL HIGH (ref 70–99)
Potassium: 4 mmol/L (ref 3.5–5.1)
Sodium: 139 mmol/L (ref 135–145)

## 2024-06-29 MED ORDER — POLYETHYLENE GLYCOL 3350 17 G PO PACK
17.0000 g | PACK | Freq: Every day | ORAL | Status: AC | PRN
Start: 2024-06-29 — End: ?

## 2024-06-29 MED ORDER — GUAIFENESIN ER 600 MG PO TB12
600.0000 mg | ORAL_TABLET | Freq: Two times a day (BID) | ORAL | Status: AC
Start: 2024-06-29 — End: 2024-07-04

## 2024-06-29 MED ORDER — SENNA 8.6 MG PO TABS: 2.0000 | ORAL_TABLET | Freq: Every day | ORAL | Status: AC

## 2024-06-29 MED ORDER — ONDANSETRON HCL 4 MG PO TABS: 4.0000 mg | ORAL_TABLET | Freq: Four times a day (QID) | ORAL | 0 refills | Status: AC | PRN

## 2024-06-29 MED ORDER — HYOSCYAMINE SULFATE 0.125 MG PO TBDP: 0.1250 mg | ORAL_TABLET | Freq: Four times a day (QID) | ORAL | 0 refills | Status: AC | PRN

## 2024-06-29 MED ORDER — CEFPODOXIME PROXETIL 200 MG PO TABS: 200.0000 mg | ORAL_TABLET | Freq: Two times a day (BID) | ORAL | Status: AC

## 2024-06-29 MED ORDER — FUROSEMIDE 40 MG PO TABS
20.0000 mg | ORAL_TABLET | Freq: Every day | ORAL | Status: AC
Start: 2024-06-29 — End: ?

## 2024-06-29 MED ORDER — ENSURE PLUS HIGH PROTEIN PO LIQD: 237.0000 mL | Freq: Two times a day (BID) | ORAL | Status: AC

## 2024-06-29 MED ORDER — SIMETHICONE 80 MG PO CHEW: 80.0000 mg | CHEWABLE_TABLET | Freq: Four times a day (QID) | ORAL | Status: AC | PRN

## 2024-06-29 NOTE — Plan of Care (Signed)
  Problem: Education: Goal: Knowledge of General Education information will improve Description: Including pain rating scale, medication(s)/side effects and non-pharmacologic comfort measures Outcome: Adequate for Discharge   Problem: Health Behavior/Discharge Planning: Goal: Ability to manage health-related needs will improve Outcome: Adequate for Discharge   Problem: Clinical Measurements: Goal: Ability to maintain clinical measurements within normal limits will improve Outcome: Adequate for Discharge Goal: Will remain free from infection Outcome: Adequate for Discharge Goal: Diagnostic test results will improve Outcome: Adequate for Discharge Goal: Respiratory complications will improve Outcome: Adequate for Discharge Goal: Cardiovascular complication will be avoided Outcome: Adequate for Discharge   Problem: Activity: Goal: Risk for activity intolerance will decrease Outcome: Adequate for Discharge   Problem: Nutrition: Goal: Adequate nutrition will be maintained Outcome: Adequate for Discharge   Problem: Coping: Goal: Level of anxiety will decrease Outcome: Adequate for Discharge   Problem: Elimination: Goal: Will not experience complications related to bowel motility Outcome: Adequate for Discharge Goal: Will not experience complications related to urinary retention Outcome: Adequate for Discharge   Problem: Pain Managment: Goal: General experience of comfort will improve and/or be controlled Outcome: Adequate for Discharge   Problem: Safety: Goal: Ability to remain free from injury will improve Outcome: Adequate for Discharge   Problem: Skin Integrity: Goal: Risk for impaired skin integrity will decrease Outcome: Adequate for Discharge   Problem: Education: Goal: Knowledge of the prescribed therapeutic regimen will improve Outcome: Adequate for Discharge Goal: Understanding of discharge needs will improve Outcome: Adequate for Discharge   Problem:  Activity: Goal: Ability to avoid complications of mobility impairment will improve Outcome: Adequate for Discharge Goal: Ability to tolerate increased activity will improve Outcome: Adequate for Discharge   Problem: Clinical Measurements: Goal: Postoperative complications will be avoided or minimized Outcome: Adequate for Discharge   Problem: Pain Management: Goal: Pain level will decrease with appropriate interventions Outcome: Adequate for Discharge   Problem: Skin Integrity: Goal: Will show signs of wound healing Outcome: Adequate for Discharge   Problem: Activity: Goal: Ability to tolerate increased activity will improve Outcome: Adequate for Discharge   Problem: Clinical Measurements: Goal: Ability to maintain a body temperature in the normal range will improve Outcome: Adequate for Discharge   Problem: Respiratory: Goal: Ability to maintain adequate ventilation will improve Outcome: Adequate for Discharge Goal: Ability to maintain a clear airway will improve Outcome: Adequate for Discharge

## 2024-06-29 NOTE — Plan of Care (Signed)
   Problem: Coping: Goal: Level of anxiety will decrease Outcome: Progressing   Problem: Pain Managment: Goal: General experience of comfort will improve and/or be controlled Outcome: Progressing   Problem: Safety: Goal: Ability to remain free from injury will improve Outcome: Progressing

## 2024-06-29 NOTE — Progress Notes (Signed)
 Mobility Specialist - Progress Note   06/29/24 0935  Mobility  Activity Ambulated with assistance  Level of Assistance Minimal assist, patient does 75% or more  Assistive Device Front wheel walker  Distance Ambulated (ft) 100 ft  Range of Motion/Exercises Active  LLE Weight Bearing Per Provider Order WBAT  Activity Response Tolerated well  Mobility Referral Yes  Mobility visit 1 Mobility  Mobility Specialist Start Time (ACUTE ONLY) 0920  Mobility Specialist Stop Time (ACUTE ONLY) 0935  Mobility Specialist Time Calculation (min) (ACUTE ONLY) 15 min   Pt was found in bed and agreeable to ambulate. C/o soreness on LLE. At EOS returned to recliner chair with all needs met. Call bell in reach and chair alarm on.  Erminio Leos,  Mobility Specialist Can be reached via Secure Chat

## 2024-06-29 NOTE — Progress Notes (Signed)
 Report called to [facility] at facility,pt refusing transport via [facility], states wants her son to provide transportation to facility. D/C via w/c  w all belongings in stable condition. Packet given to son to deliver to facility.

## 2024-06-29 NOTE — Care Management Important Message (Signed)
 Important Message  Patient Details IM Letter given Name: Christina Mckinney MRN: [id number] Date of Birth: [birth year]   Important Message Given:  Yes - Medicare IM     Kit Brubacher 06/29/2024, 2:39 PM

## 2024-06-29 NOTE — Discharge Summary (Signed)
 Physician Discharge Summary  Christina Mckinney FMW:[id number] DOB: Jun 14, 1932 DOA: 06/22/2024  PCP: Domenica Harlene LABOR, MD  Admit date: 06/22/2024 Discharge date: 06/29/2024  Time spent: 60 minutes  Recommendations for Outpatient Follow-up:  Follow-up with MD at skilled nursing facility.  Patient will need a basic metabolic profile done in 1 week to follow-up on electrolytes and renal function. Follow-up with Dr. Ernie, orthopedics in 2 weeks.   Discharge Diagnoses:  Principal Problem:   Hip fracture (HCC) Active Problems:   COPD (chronic obstructive pulmonary disease) (HCC)   Hypoxia   Fall at home, initial encounter   CAD (coronary artery disease)   S/P total left hip arthroplasty   SVT (supraventricular tachycardia) (HCC)   Chronic heart failure with preserved ejection fraction (HCC)   Essential hypertension   CAP (community acquired pneumonia)   Leukocytosis   Discharge Condition: Stable and improved.  Diet recommendation: Regular  Filed Weights   06/22/24 1748  Weight: 62.7 kg    History of present illness:  HPI per Dr. Alfornia Christina Mckinney is a 89 y.o. female with medical history significant of CAD, HFpEF, NSVT, DVT/PE, essential tremor, GERD, gout, macular degeneration, osteoarthritis, hyperlipidemia, hypertension, hypothyroidism, anemia, anxiety, depression, prediabetes, CKD stage IIIb, COPD, BPPV, and other medical comorbidities presenting with a chief complaint of left hip pain since after a fall on Saturday 8/9.  Patient states she has chronic dizziness and vision problems for which she has previously been evaluated by neurology and ophthalmology.  On Saturday, her 43-year-old granddaughter was holding her hand and helping her walk to her recliner so she could sit but due to vision problems, patient somehow missed sitting on the recliner and fell on the floor instead landing on her left hip.  She thinks she might have hit her head on the floor also but denies loss of  consciousness.  Since after the fall, she has been able to walk using her walker but with a lot of difficulty due to severe pain in her left hip.  She reports chronic shortness of breath and denies any worsening shortness of breath in the past few days.  Denies cough or chest pain.  She reports chronic constipation but denies nausea, vomiting, or abdominal pain.  She does not take aspirin  or any other blood thinners at home.   Patient was seen by her PCP yesterday prior to ED arrival and had an x-ray done which showed impacted left femoral neck fracture.  Outpatient labs done at PCPs office notable for WBC count 11.4, BUN 31, creatinine 1.15.   ED Course: No labs repeated in the ED as outpatient labs were done by PCP prior to ED arrival.  EKG showing sinus rhythm, RBBB, no acute ischemic changes in comparison to previous EKG.  Patient was given Percocet for pain.  Not hypoxic on arrival but later desatted to 88-92% on room air and placed on 2 L [place].  EDP discussed the case with Dr. Ernie with orthopedics who will see the patient in the morning and decide on timing of surgery.   Hospital Course:  #1 left hip femoral neck fracture, impacted -Secondary to mechanical fall. - Patient seen in consultation by orthopedics and patient status post left total hip replacement on 06/23/2024. - Weightbearing as tolerated. - Patient assessed by PT/OT who recommended SNF placement.. - Pain management and DVT prophylaxis per orthopedics. -Outpatient follow-up with orthopedics, Dr. Ernie in 2 weeks postdischarge.   2.  Recent fall -CT head unremarkable.   3.  Mild hypoxia -Patient was not hypoxic initially on arrival to the ED but noted to desat to 88 to 92% on room air and placed on 2 L O2. - Felt hypoxia likely secondary to opioids as patient noted to have received Percocet several hours prior to that. - Chest x-ray done on admission with increased pulmonary congestion, no frank interstitial edema pleural effusion,  diffuse chronic bronchial wall thickening. - Hypoxia improved during the hospitalization.   - Patient also noted to have a community-acquired pneumonia during the hospitalization and placed on antibiotics with clinical improvement.  - Patient noted with sats of 96 to 98% on room air by day of discharge.    4.  Leukocytosis/CAP - Patient noted with significant leukocytosis with complaints of congestion and cough. - Chest x-ray done on 06/25/2024, with patchy airspace opacity in the left lung base which may represent atelectasis or developing bronchopneumonia. - Leukocytosis trended down since starting empiric IV antibiotics.  - Urine pneumococcus antigen negative.  - Urine Legionella antigen negative.  - Patient maintained on IV Rocephin  as well as azithromycin  and Mucinex .   - Patient improved clinically during the hospitalization and will be discharged on 2 more days of Vantin  to complete a 7-day course of antibiotic treatment.   - Patient will be discharged in stable and improved condition.   5.  History of SVT - Patient maintained on home regimen metoprolol .   6.  Hypertension -BP was initially soft but improved.   - Patient was placed back on home regimen metoprolol  and have home dose Lasix  held.   - Cozaar  was held during the hospitalization and will be held on discharge until follow-up with PCP.    7.  GERD - Patient maintained on PPI.     8.  Hypothyroidism - Patient maintained on home regimen Synthroid .    9.  COPD -Stable. -DuoNebs as needed.   10.  CAD/HFpEF -Stable. -Lasix  initially resumed but held due to soft blood pressure. - Patient was subsequently placed back on half home dose of Lasix  20 mg daily which she will be discharged on.   - Outpatient follow-up.    11.  Postop acute blood loss anemia -H&H stable. - Anemia panel with iron level of 18, TIBC of 217, ferritin of 259, folate of 9.6, vitamin B12 of 1122.  - Hemoglobin stabilized at 10.0 by day of  discharge.    12.  Constipation -Patient with complaints of constipation, no bowel movement in approximately 5 days. - Patient was on MiraLAX  twice daily, Senokot-S twice daily and received sorbitol  every 3 hours x 2 doses on 06/26/2024 with good results.   - MiraLAX  changed to daily as needed.   - Patient maintained on Senokot nightly.   Patient also placed on Dulcolax suppository as needed.   Procedures: CT head 06/23/2024 Left hip plain films of the pelvis 06/22/2024 Plain films of the pelvis 06/23/2024 Chest x-ray 06/23/2024, 06/25/2024 Left total hip replacement through anterior approach per orthopedics: Dr. Ernie 06/23/2024  Consultations: Orthopedics: Dr. Ernie 06/23/2024   Discharge Exam: Vitals:   06/28/24 1421 06/29/24 0649  BP: 131/68 136/60  Pulse: 84 80  Resp: 16 16  Temp: 98.7 F (37.1 C) 98.3 F (36.8 C)  SpO2: 98% 96%    General: NAD Cardiovascular: RRR no murmurs rubs or gallops.  No JVD.  No pitting lower extremity edema. Respiratory: Clear to auscultation bilaterally.  No wheezes, no crackles, no rhonchi.  Fair air movement.  Speaking in full sentences.  Discharge Instructions   Discharge Instructions     Diet general   Complete by: As directed    Increase activity slowly   Complete by: As directed    No wound care   Complete by: As directed       Allergies as of 06/29/2024       Reactions   Simvastatin  Other (See Comments)   Hip pain   Crestor  [rosuvastatin ]    Left hip pain   Lipitor [atorvastatin ]    Pain in hips   Red Dye #40 (allura Red)    Hip pain   Statins Other (See Comments)   Muscle aches, could not walk Muscle aches, could not walk, Muscle aches, could not walk        Medication List     PAUSE taking these medications    losartan  25 MG tablet Wait to take this until your doctor or other care provider tells you to start again. Commonly known as: COZAAR  Take 1 tablet (25 mg total) by mouth daily.       STOP taking these  medications    aspirin  81 MG tablet Replaced by: aspirin  81 MG chewable tablet       TAKE these medications    albuterol  108 (90 Base) MCG/ACT inhaler Commonly known as: VENTOLIN  HFA USE 2 INHALATIONS EVERY 6 HOURS AS NEEDED FOR WHEEZING OR SHORTNESS OF BREATH   aspirin  81 MG chewable tablet Chew 1 tablet (81 mg total) by mouth 2 (two) times daily for 28 days. Replaces: aspirin  81 MG tablet   cefpodoxime  200 MG tablet Commonly known as: VANTIN  Take 1 tablet (200 mg total) by mouth 2 (two) times daily for 2 days. Start taking on: June 30, 2024   erythromycin  ophthalmic ointment Place 1 Application into both eyes at bedtime.   febuxostat  40 MG tablet Commonly known as: ULORIC  Take 1 tablet (40 mg total) by mouth daily.   feeding supplement Liqd Take 237 mLs by mouth 2 (two) times daily between meals.   furosemide  40 MG tablet Commonly known as: LASIX  Take 0.5 tablets (20 mg total) by mouth daily. What changed: how much to take   guaiFENesin  600 MG 12 hr tablet Commonly known as: MUCINEX  Take 1 tablet (600 mg total) by mouth 2 (two) times daily for 5 days.   hyoscyamine  0.125 MG Tbdp disintergrating tablet Commonly known as: ANASPAZ  Place 1 tablet (0.125 mg total) under the tongue every 6 (six) hours as needed for bladder spasms.   methocarbamol  500 MG tablet Commonly known as: ROBAXIN  Take 1 tablet (500 mg total) by mouth every 6 (six) hours as needed for muscle spasms (thigh pain).   metoprolol  succinate 100 MG 24 hr tablet Commonly known as: TOPROL -XL Take 1 tablet (100 mg total) by mouth daily. Take with or immediately following a meal.   ondansetron  4 MG tablet Commonly known as: ZOFRAN  Take 1 tablet (4 mg total) by mouth every 6 (six) hours as needed for nausea.   oxyCODONE  5 MG immediate release tablet Commonly known as: Oxy IR/ROXICODONE  Take 0.5-1 tablets (2.5-5 mg total) by mouth every 6 (six) hours as needed for severe pain (pain score 7-10).    pantoprazole  40 MG tablet Commonly known as: PROTONIX  Take 1 tablet (40 mg total) by mouth 2 (two) times daily before a meal.   PATADAY  OP Apply 1 drop to eye daily.   polyethylene glycol 17 g packet Commonly known as: MIRALAX  / GLYCOLAX  Take 17 g by mouth daily as  needed for moderate constipation.   senna 8.6 MG Tabs tablet Commonly known as: SENOKOT Take 2 tablets (17.2 mg total) by mouth at bedtime.   simethicone  80 MG chewable tablet Commonly known as: MYLICON Chew 1 tablet (80 mg total) by mouth every 6 (six) hours as needed for flatulence.   Synthroid  50 MCG tablet Generic drug: levothyroxine  Take 1 tablet (50 mcg total) by mouth daily before breakfast.   Vevye  0.1 % Soln Generic drug: cycloSPORINE  Place 1 drop into both eyes in the morning and at bedtime.       Allergies  Allergen Reactions   Simvastatin  Other (See Comments)    Hip pain   Crestor  [Rosuvastatin ]     Left hip pain   Lipitor [Atorvastatin ]     Pain in hips   Red Dye #40 (Allura Red)     Hip pain   Statins Other (See Comments)    Muscle aches, could not walk  Muscle aches, could not walk, Muscle aches, could not walk    Contact information for follow-up providers     Ernie Cough, MD. Schedule an appointment as soon as possible for a visit in 2 week(s).   Specialty: Orthopedic Surgery Contact information: [address] [address] [place] [address] [phone]         MD AT SNF Follow up.               Contact information for after-discharge care     Destination     [facility], [place] .   Service: Skilled Nursing Contact information: [address] [place]  [phone]                      The results of significant diagnostics from this hospitalization (including imaging, microbiology, ancillary and laboratory) are listed below for reference.    Significant Diagnostic Studies: DG CHEST PORT 1  VIEW Result Date: 06/25/2024 CLINICAL DATA:  100030 Leukocytosis 100030 EXAM: PORTABLE CHEST - 1 VIEW COMPARISON:  June 23, 2024 FINDINGS: Patchy opacities in the left lung base. The cardiac silhouette is at the upper limits of normal, likely accentuated by AP technique and low lung volumes. Tortuous aorta with aortic atherosclerosis. No acute fracture or destructive lesions. Multilevel thoracic osteophytosis. Bilateral shoulder arthroplasties. IMPRESSION: Patchy airspace opacities in the left lung base, which may represent atelectasis or a developing bronchopneumonia, in the correct clinical context. Electronically Signed   By: Rogelia Myers M.D.   On: 06/25/2024 11:02   DG Pelvis Portable Result Date: 06/23/2024 CLINICAL DATA:  Status post hip arthroplasty EXAM: PORTABLE PELVIS 1-2 VIEWS COMPARISON:  Pelvis x-ray 04/19/2015 FINDINGS: New left hip arthroplasty appears in anatomic alignment. No evidence for fracture. There is left hip soft tissue air compatible with recent surgery. The bones are osteopenic. Degenerative changes affect the pubic symphysis, unchanged. IMPRESSION: New left hip arthroplasty appears in anatomic alignment. Electronically Signed   By: Greig Pique M.D.   On: 06/23/2024 22:36   DG HIP UNILAT WITH PELVIS 1V LEFT Result Date: 06/23/2024 CLINICAL DATA:  Elective surgery. EXAM: DG HIP (WITH OR WITHOUT PELVIS) 1V*L* COMPARISON:  None Available. FINDINGS: Two fluoroscopic spot views of the pelvis and left hip obtained in the operating room. Images during hip arthroplasty. Fluoroscopy time 10 seconds. Dose 0.66 mGy. IMPRESSION: Intraoperative fluoroscopy during left hip arthroplasty. Electronically Signed   By: Andrea Gasman M.D.   On: 06/23/2024 18:39   DG C-Arm 1-60 Min-No Report Result  Date: 06/23/2024 Fluoroscopy was utilized by the requesting physician.  No radiographic interpretation.   CT HEAD WO CONTRAST ( ) Result Date: 06/23/2024 CLINICAL DATA:  89 year old female  status post fall 3 days ago. Left hip fracture. EXAM: CT HEAD WITHOUT CONTRAST TECHNIQUE: Contiguous axial images were obtained from the base of the skull through the vertex without intravenous contrast. RADIATION DOSE REDUCTION: This exam was performed according to the departmental dose-optimization program which includes automated exposure control, adjustment of the mA and/or kV according to patient size and/or use of iterative reconstruction technique. COMPARISON:  Brain MRI 01/01/2016. FINDINGS: Brain: Cerebral volume not significantly changed since 2017. No midline shift, ventriculomegaly, mass effect, evidence of mass lesion, intracranial hemorrhage or evidence of cortically based acute infarction. Normal for age gray-white differentiation. Vascular: Calcified atherosclerosis at the skull base. No suspicious intracranial vascular hyperdensity. Skull: Mild motion artifact at the mid calvarium level. No acute osseous abnormality identified. Sinuses/Orbits: Visualized paranasal sinuses and mastoids are clear. Other: No acute orbit or scalp soft tissue finding identified. IMPRESSION: No recent traumatic injury identified. Normal for age noncontrast Head CT when allowing for mild motion artifact. Electronically Signed   By: VEAR Hurst M.D.   On: 06/23/2024 10:08   Chest Portable 1 View Result Date: 06/23/2024 EXAM: 1 VIEW XRAY OF THE CHEST 06/23/2024 05:59:00 AM COMPARISON: 12/17/2018 CLINICAL HISTORY: 200808 Hypoxia 200808. hypoxia FINDINGS: LUNGS AND PLEURA: Increased pulmonary vascular congestion. No pleural effusion or pneumothorax. Diffuse, chronic bronchial wall thickening. No airspace consolidation. HEART AND MEDIASTINUM: Aortic atherosclerosis. BONES AND SOFT TISSUES: Status post bilateral shoulder arthroplasty. No acute osseous abnormality. IMPRESSION: 1. Increased pulmonary vascular congestion. No frank interstitial edema or pleural effusion. 2. Diffuse, chronic bronchial wall thickening. 3. Aortic  atherosclerotic calcification. Electronically signed by: Waddell Calk MD 06/23/2024 07:53 AM EDT RP Workstation: [id number]   DG HIP UNILAT WITH PELVIS 2-3 VIEWS LEFT Result Date: 06/22/2024 CLINICAL DATA:  Fall, left hip pain EXAM: DG HIP (WITH OR WITHOUT PELVIS) 2-3V*L* COMPARISON:  None Available. FINDINGS: There is angulation of the left femoral neck with mild impaction compatible with impacted femoral neck fracture with slight valgus angulation. No subluxation or dislocation. Early degenerative changes in the hips. IMPRESSION: Impacted left femoral neck fracture. Electronically Signed   By: Franky Crease M.D.   On: 06/22/2024 14:07    Microbiology: Recent Results (from the past 240 hours)  Urine Culture     Status: None   Collection Time: 06/22/24 12:25 PM   Specimen: Urine  Result Value Ref Range Status   MICRO NUMBER: [id number]  Final   SPECIMEN QUALITY: Adequate  Final   Sample Source NOT GIVEN  Final   STATUS: FINAL  Final   Result: No Growth  Final  Surgical pcr screen     Status: None   Collection Time: 06/23/24  5:58 AM   Specimen: Nasal Mucosa; Nasal Swab  Result Value Ref Range Status   MRSA, PCR NEGATIVE NEGATIVE Final   Staphylococcus aureus NEGATIVE NEGATIVE Final    Comment: (NOTE) The Xpert SA Assay (FDA approved for NASAL specimens in patients 60 years of age and older), is one component of a comprehensive surveillance program. It is not intended to diagnose infection nor to guide or monitor treatment. Performed at [facility], [address] [place], [place] [address]   Urine Culture     Status: Abnormal   Collection Time: 06/25/24  1:18 PM   Specimen: Urine, Catheterized  Result Value Ref Range Status  Specimen Description   Final    URINE, CATHETERIZED Performed at [facility], [address], [place], [place] [address]    Special Requests   Final    NONE Performed at [facility], [address] [place], [place] [address]    Culture 20,000 COLONIES/mL PSEUDOMONAS AERUGINOSA (A)  Final   Report Status 06/27/2024 FINAL  Final   Organism ID, Bacteria PSEUDOMONAS AERUGINOSA (A)  Final      Susceptibility   Pseudomonas aeruginosa - MIC*    MEROPENEM <=0.25 SENSITIVE Sensitive     CIPROFLOXACIN  0.25 SENSITIVE Sensitive     IMIPENEM 2 SENSITIVE Sensitive     PIP/TAZO Value in next row Sensitive ug/mL     8 SENSITIVEThis is a modified FDA-approved test that has been validated and its performance characteristics determined by the reporting laboratory.  This laboratory is certified under the Clinical Laboratory Improvement Amendments CLIA as qualified to perform high complexity clinical laboratory testing.    CEFEPIME Value in next row Sensitive      8 SENSITIVEThis is a modified FDA-approved test that has been validated and its performance characteristics determined by the reporting laboratory.  This laboratory is certified under the Clinical Laboratory Improvement Amendments CLIA as qualified to perform high complexity clinical laboratory testing.    CEFTAZIDIME/AVIBACTAM Value in next row Sensitive ug/mL     8 SENSITIVEThis is a modified FDA-approved test that has been validated and its performance characteristics determined by the reporting laboratory.  This laboratory is certified under the Clinical Laboratory Improvement Amendments CLIA as qualified to perform high complexity clinical laboratory testing.    CEFTOLOZANE/TAZOBACTAM Value in next row Sensitive ug/mL     8 SENSITIVEThis is a modified FDA-approved test that has been validated and its performance characteristics determined by the reporting laboratory.  This laboratory is certified under the Clinical Laboratory Improvement Amendments CLIA as qualified to perform high complexity clinical laboratory testing.    TOBRAMYCIN Value in next row Sensitive      8 SENSITIVEThis is a modified FDA-approved test that has been validated  and its performance characteristics determined by the reporting laboratory.  This laboratory is certified under the Clinical Laboratory Improvement Amendments CLIA as qualified to perform high complexity clinical laboratory testing.    CEFTAZIDIME Value in next row Sensitive      8 SENSITIVEThis is a modified FDA-approved test that has been validated and its performance characteristics determined by the reporting laboratory.  This laboratory is certified under the Clinical Laboratory Improvement Amendments CLIA as qualified to perform high complexity clinical laboratory testing.    * 20,000 COLONIES/mL PSEUDOMONAS AERUGINOSA     Labs: Basic Metabolic Panel: Recent Labs  Lab 06/25/24 0345 06/26/24 0334 06/27/24 0338 06/28/24 0301 06/29/24 0327  NA 134* 137 139 141 139  K 4.9 5.1 4.3 4.6 4.0  CL 105 109 110 108 106  CO2 20* 20* 21* 22 23  GLUCOSE 143* 106* 94 90 100*  BUN 40* 36* 37* 27* 23  CREATININE 1.36* 1.19* 1.11* 0.94 0.95  CALCIUM  9.0 9.3 9.3 9.5 9.2  MG  --  2.6*  --   --   --    Liver Function Tests: No results for input(s): AST, ALT, ALKPHOS, BILITOT, PROT, ALBUMIN in the last 168 hours. No results for input(s): LIPASE, AMYLASE in the last 168 hours. No results for input(s): AMMONIA in the last 168 hours. CBC: Recent Labs  Lab 06/25/24 0612 06/26/24 0334 06/27/24 0338 06/28/24 0301  06/29/24 0327  WBC 18.1* 12.6* 10.3 10.7* 10.6*  NEUTROABS  --  9.7* 6.8  --   --   HGB 9.7* 9.3* 9.6* 10.4* 10.0*  HCT 31.0* 29.2* 30.7* 33.1* 32.2*  MCV 100.6* 101.0* 101.0* 100.0 98.5  PLT 208 208 226 285 299   Cardiac Enzymes: No results for input(s): CKTOTAL, CKMB, CKMBINDEX, TROPONINI in the last 168 hours. BNP: BNP (last 3 results) No results for input(s): BNP in the last 8760 hours.  ProBNP (last 3 results) No results for input(s): PROBNP in the last 8760 hours.  CBG: No results for input(s): GLUCAP in the last 168  hours.     Signed:  Toribio Hummer MD.  [facility] 06/29/2024, 1:05 PM

## 2024-06-29 NOTE — TOC Transition Note (Addendum)
 Transition of Care [facility]) - Discharge Note   Patient Details  Name: Christina Mckinney MRN: [id number] Date of Birth: 1931/12/02  Transition of Care [facility]) CM/SW Contact:  Alfonse JONELLE Rex, RN Phone Number: 06/29/2024, 1:43 PM   Clinical Narrative:   DC to SNF[facility], [address], [facility] for transport. No further TOC needs identified.     Final next level of care: Skilled Nursing Facility Barriers to Discharge: Barriers Resolved   Patient Goals and CMS Choice Patient states their goals for this hospitalization and ongoing recovery are:: return home after short term rehab/SNF CMS Medicare.gov Compare Post Acute Care list provided to:: Patient Choice offered to / list presented to : Patient [place] ownership interest in [facility].provided to:: Patient    Discharge Placement              Patient chooses bed at: [facility] [place] Patient to be transferred to facility by: [facility] Name of family member notified: Rosangelica, [place] (Son)  [phone] [facility]) Patient and family notified of of transfer: 06/29/24  Discharge Plan and Services Additional resources added to the After Visit Summary for                                       Social Drivers of Health (SDOH) Interventions SDOH Screenings   Food Insecurity: No Food Insecurity (06/23/2024)  Housing: Low Risk  (06/23/2024)  Transportation Needs: No Transportation Needs (06/23/2024)  Utilities: Not At Risk (06/23/2024)  Depression (PHQ2-9): Low Risk  (06/11/2023)  Social Connections: Socially Isolated (06/23/2024)  Tobacco Use: Medium Risk (06/23/2024)     Readmission Risk Interventions    06/29/2024    1:41 PM  Readmission Risk Prevention Plan  Transportation Screening Complete  PCP or Specialist Appt within 5-7 Days Complete  Home Care Screening Complete  Medication Review (RN CM) Complete

## 2024-07-07 DIAGNOSIS — N1832 Chronic kidney disease, stage 3b: Secondary | ICD-10-CM | POA: Diagnosis not present

## 2024-07-07 DIAGNOSIS — E1122 Type 2 diabetes mellitus with diabetic chronic kidney disease: Secondary | ICD-10-CM | POA: Diagnosis not present

## 2024-07-07 DIAGNOSIS — R002 Palpitations: Secondary | ICD-10-CM | POA: Diagnosis not present

## 2024-07-07 DIAGNOSIS — I509 Heart failure, unspecified: Secondary | ICD-10-CM | POA: Diagnosis not present

## 2024-07-07 DIAGNOSIS — I129 Hypertensive chronic kidney disease with stage 1 through stage 4 chronic kidney disease, or unspecified chronic kidney disease: Secondary | ICD-10-CM | POA: Diagnosis not present

## 2024-07-12 DIAGNOSIS — Z96642 Presence of left artificial hip joint: Secondary | ICD-10-CM | POA: Diagnosis not present

## 2024-07-12 DIAGNOSIS — I2510 Atherosclerotic heart disease of native coronary artery without angina pectoris: Secondary | ICD-10-CM | POA: Diagnosis not present

## 2024-07-12 DIAGNOSIS — E039 Hypothyroidism, unspecified: Secondary | ICD-10-CM | POA: Diagnosis not present

## 2024-07-12 DIAGNOSIS — U071 COVID-19: Secondary | ICD-10-CM | POA: Diagnosis not present

## 2024-07-12 DIAGNOSIS — J1282 Pneumonia due to coronavirus disease 2019: Secondary | ICD-10-CM | POA: Diagnosis not present

## 2024-07-12 DIAGNOSIS — S72002A Fracture of unspecified part of neck of left femur, initial encounter for closed fracture: Secondary | ICD-10-CM | POA: Diagnosis not present

## 2024-07-12 DIAGNOSIS — I5032 Chronic diastolic (congestive) heart failure: Secondary | ICD-10-CM | POA: Diagnosis not present

## 2024-07-20 ENCOUNTER — Other Ambulatory Visit: Payer: Self-pay | Admitting: Family Medicine

## 2024-07-20 ENCOUNTER — Telehealth: Payer: Self-pay

## 2024-07-20 DIAGNOSIS — I10 Essential (primary) hypertension: Secondary | ICD-10-CM

## 2024-07-20 NOTE — Transitions of Care (Post Inpatient/ED Visit) (Signed)
   07/20/2024  Name: Christina Mckinney MRN: [id number] DOB: 25-Dec-1931  Today's TOC FU Call Status: Today's TOC FU Call Status:: Unsuccessful Call (1st Attempt) Unsuccessful Call (1st Attempt) Date: 07/20/24  Attempted to reach the patient regarding the most recent Inpatient/ED visit.  Follow Up Plan: Additional outreach attempts will be made to reach the patient to complete the Transitions of Care (Post Inpatient/ED visit) call.   Signature Charmaine Bloodgood, LPN [facility] Health Advisor [place] [facility] You Are. We Are. One [facility] Direct Dial [phone]

## 2024-07-20 NOTE — Transitions of Care (Post Inpatient/ED Visit) (Signed)
 07/20/2024  Name: Christina Mckinney MRN: [id number] DOB: May 05, 1932  Today's TOC FU Call Status: Today's TOC FU Call Status:: Successful TOC FU Call Completed Unsuccessful Call (1st Attempt) Date: 07/20/24 [facility] FU Call Complete Date: 07/20/24 Patient's Name and Date of Birth confirmed.  Transition Care Management Follow-up Telephone Call Date of Discharge: 07/18/24 Discharge Facility: Other [facility]) Name of Other (Non[facility]) Discharge Facility: [facility] Type of Discharge: Inpatient Admission Primary Inpatient Discharge Diagnosis:: Left hip fracture How have you been since you were released from the hospital?: Better Any questions or concerns?: No  Items Reviewed: Did you receive and understand the discharge instructions provided?: Yes Medications obtained,verified, and reconciled?: Yes (Medications Reviewed) Any new allergies since your discharge?: No Dietary orders reviewed?: NA Do you have support at home?: Yes People in Home [RPT]: child(ren), adult Name of Support/Comfort Primary Source: Cindy  Medications Reviewed Today: Medications Reviewed Today     Reviewed by Lavelle Charmaine NOVAK, LPN (Licensed Practical Nurse) on 07/20/24 at 1033  Med List Status: <None>   Medication Order Taking? Sig Documenting Provider Last Dose Status Informant  albuterol  (VENTOLIN  HFA) 108 (90 Base) MCG/ACT inhaler [id number] Yes USE 2 INHALATIONS EVERY 6 HOURS AS NEEDED FOR WHEEZING OR SHORTNESS OF BREATH Domenica Harlene LABOR, MD  Active Self, Pharmacy Records  aspirin  81 MG chewable tablet [id number] Yes Chew 1 tablet (81 mg total) by mouth 2 (two) times daily for 28 days. Patti Rosina SAUNDERS, PA-C  Active   erythromycin  ophthalmic ointment [id number] Yes Place 1 Application into both eyes at bedtime. [provider]  Active Self, Pharmacy Records  febuxostat  (ULORIC ) 40 MG tablet [id number] Yes Take 1 tablet (40 mg total) by mouth daily. Domenica Harlene LABOR, MD  Active Self, Pharmacy Records   feeding supplement (ENSURE PLUS HIGH PROTEIN) LIQD [id number] Yes Take 237 mLs by mouth 2 (two) times daily between meals. Sebastian Toribio GAILS, MD  Active   furosemide  (LASIX ) 40 MG tablet [id number] Yes Take 0.5 tablets (20 mg total) by mouth daily. Sebastian Toribio GAILS, MD  Active   hyoscyamine  (ANASPAZ ) 0.125 MG TBDP disintergrating tablet [id number] Yes Place 1 tablet (0.125 mg total) under the tongue every 6 (six) hours as needed for bladder spasms. Sebastian Toribio GAILS, MD  Active   losartan  (COZAAR ) 25 MG tablet [id number] Yes Take 1 tablet (25 mg total) by mouth daily. Domenica Harlene LABOR, MD  Active   methocarbamol  (ROBAXIN ) 500 MG tablet [id number] Yes Take 1 tablet (500 mg total) by mouth every 6 (six) hours as needed for muscle spasms (thigh pain). Patti Rosina SAUNDERS, PA-C  Active   metoprolol  succinate (TOPROL -XL) 100 MG 24 hr tablet [id number] Yes Take 1 tablet (100 mg total) by mouth daily. Take with or immediately following a meal Domenica Harlene LABOR, MD  Active   Olopatadine  HCl (PATADAY  OP) [id number] Yes Apply 1 drop to eye daily. [provider]  Active Self, Pharmacy Records  ondansetron  (ZOFRAN ) 4 MG tablet [id number] Yes Take 1 tablet (4 mg total) by mouth every 6 (six) hours as needed for nausea. Sebastian Toribio GAILS, MD  Active   oxyCODONE  (OXY IR/ROXICODONE ) 5 MG immediate release tablet [id number] Yes Take 0.5-1 tablets (2.5-5 mg total) by mouth every 6 (six) hours as needed for severe pain (pain score 7-10). Patti Rosina SAUNDERS, PA-C  Active   pantoprazole  (PROTONIX ) 40 MG tablet [id number] Yes Take 1 tablet (40 mg total) by mouth 2 (two) times daily before a meal. Domenica Harlene LABOR, MD  Active Self, Pharmacy Records  polyethylene glycol (MIRALAX  / GLYCOLAX ) 17 g packet [id number] Yes Take 17 g by mouth daily as needed for moderate constipation. Sebastian Toribio GAILS, MD  Active   senna (SENOKOT) 8.6 MG TABS tablet [id number] Yes Take 2 tablets (17.2 mg total) by mouth at bedtime. Sebastian Toribio GAILS,  MD  Active   simethicone  [facility]) 80 MG chewable tablet [id number] Yes Chew 1 tablet (80 mg total) by mouth every 6 (six) hours as needed for flatulence. Sebastian Toribio GAILS, MD  Active   SYNTHROID  50 MCG tablet [id number] Yes Take 1 tablet (50 mcg total) by mouth daily before breakfast. Domenica Harlene LABOR, MD  Active Self, Pharmacy Records  VEVYE  0.1 % SOLN [id number] Yes Place 1 drop into both eyes in the morning and at bedtime. [provider]  Active Self, Pharmacy Records            Home Care and Equipment/Supplies: Were Home Health Services Ordered?: NA  Functional Questionnaire: Do you need assistance with bathing/showering or dressing?: Yes Do you need assistance with meal preparation?: Yes Do you need assistance with eating?: No Do you have difficulty maintaining continence: No Do you need assistance with getting out of bed/getting out of a chair/moving?: Yes Do you have difficulty managing or taking your medications?: No  Follow up appointments reviewed: PCP Follow-up appointment confirmed?: Yes Date of PCP follow-up appointment?: 07/24/24 Follow-up Provider: Dr. Antonio Specialist [facility] Follow-up appointment confirmed?: Yes Follow-Up Specialty Provider:: ortho- Dr. Ernie Do you need transportation to your follow-up appointment?: No Do you understand care options if your condition(s) worsen?: Yes-patient verbalized understanding    SIGNATURE Charmaine Bloodgood, LPN [facility] Health Advisor [place] [facility] You Are. We Are. [facility] Direct Dial [phone]

## 2024-07-22 DIAGNOSIS — D509 Iron deficiency anemia, unspecified: Secondary | ICD-10-CM | POA: Diagnosis not present

## 2024-07-22 DIAGNOSIS — K219 Gastro-esophageal reflux disease without esophagitis: Secondary | ICD-10-CM | POA: Diagnosis not present

## 2024-07-22 DIAGNOSIS — I5032 Chronic diastolic (congestive) heart failure: Secondary | ICD-10-CM | POA: Diagnosis not present

## 2024-07-22 DIAGNOSIS — M109 Gout, unspecified: Secondary | ICD-10-CM | POA: Diagnosis not present

## 2024-07-22 DIAGNOSIS — G2581 Restless legs syndrome: Secondary | ICD-10-CM | POA: Diagnosis not present

## 2024-07-22 DIAGNOSIS — I130 Hypertensive heart and chronic kidney disease with heart failure and stage 1 through stage 4 chronic kidney disease, or unspecified chronic kidney disease: Secondary | ICD-10-CM | POA: Diagnosis not present

## 2024-07-22 DIAGNOSIS — S72002D Fracture of unspecified part of neck of left femur, subsequent encounter for closed fracture with routine healing: Secondary | ICD-10-CM | POA: Diagnosis not present

## 2024-07-22 DIAGNOSIS — R7303 Prediabetes: Secondary | ICD-10-CM | POA: Diagnosis not present

## 2024-07-22 DIAGNOSIS — J1282 Pneumonia due to coronavirus disease 2019: Secondary | ICD-10-CM | POA: Diagnosis not present

## 2024-07-22 DIAGNOSIS — H3530 Unspecified macular degeneration: Secondary | ICD-10-CM | POA: Diagnosis not present

## 2024-07-22 DIAGNOSIS — Z96642 Presence of left artificial hip joint: Secondary | ICD-10-CM | POA: Diagnosis not present

## 2024-07-22 DIAGNOSIS — H8110 Benign paroxysmal vertigo, unspecified ear: Secondary | ICD-10-CM | POA: Diagnosis not present

## 2024-07-22 DIAGNOSIS — I4510 Unspecified right bundle-branch block: Secondary | ICD-10-CM | POA: Diagnosis not present

## 2024-07-22 DIAGNOSIS — D631 Anemia in chronic kidney disease: Secondary | ICD-10-CM | POA: Diagnosis not present

## 2024-07-22 DIAGNOSIS — G250 Essential tremor: Secondary | ICD-10-CM | POA: Diagnosis not present

## 2024-07-22 DIAGNOSIS — I700 Atherosclerosis of aorta: Secondary | ICD-10-CM | POA: Diagnosis not present

## 2024-07-22 DIAGNOSIS — J440 Chronic obstructive pulmonary disease with acute lower respiratory infection: Secondary | ICD-10-CM | POA: Diagnosis not present

## 2024-07-22 DIAGNOSIS — E039 Hypothyroidism, unspecified: Secondary | ICD-10-CM | POA: Diagnosis not present

## 2024-07-22 DIAGNOSIS — D72829 Elevated white blood cell count, unspecified: Secondary | ICD-10-CM | POA: Diagnosis not present

## 2024-07-22 DIAGNOSIS — U071 COVID-19: Secondary | ICD-10-CM | POA: Diagnosis not present

## 2024-07-22 DIAGNOSIS — H04123 Dry eye syndrome of bilateral lacrimal glands: Secondary | ICD-10-CM | POA: Diagnosis not present

## 2024-07-22 DIAGNOSIS — I2510 Atherosclerotic heart disease of native coronary artery without angina pectoris: Secondary | ICD-10-CM | POA: Diagnosis not present

## 2024-07-22 DIAGNOSIS — N1832 Chronic kidney disease, stage 3b: Secondary | ICD-10-CM | POA: Diagnosis not present

## 2024-07-22 DIAGNOSIS — F419 Anxiety disorder, unspecified: Secondary | ICD-10-CM | POA: Diagnosis not present

## 2024-07-22 DIAGNOSIS — I471 Supraventricular tachycardia, unspecified: Secondary | ICD-10-CM | POA: Diagnosis not present

## 2024-07-23 ENCOUNTER — Telehealth: Payer: Self-pay

## 2024-07-23 NOTE — Telephone Encounter (Signed)
 Copied from CRM [id number]. Topic: Clinical - Home Health Verbal Orders >> Jul 22, 2024  2:17 PM Mesmerise C wrote: Caller/Agency: [facility] Callback Number: [phone] Vm Confidential Service Requested: Physical Therapy Frequency: 1 week 4 once every 4 weeks for a month  Any new concerns about the patient? Yes fair appetite nausea some sputum, congestion right lower lung, dark smelly urine, tested negative for uti, has cpap but not using it currently, concerns about meds, several changes to meds, doesn't have some meds ondansetron ,sennosides,hyoscyamine  has levothyroxine  but worried about dosage took a 50mg  and a 25mg  together

## 2024-07-23 NOTE — Telephone Encounter (Signed)
 Signe, PT w/ [facility] was advised to move  forward with verbal order for physical therapy 1 time a week for 4 weeks 1 time every 4 week for a month

## 2024-07-24 ENCOUNTER — Encounter: Payer: Self-pay | Admitting: Family Medicine

## 2024-07-24 ENCOUNTER — Ambulatory Visit (INDEPENDENT_AMBULATORY_CARE_PROVIDER_SITE_OTHER): Admitting: Family Medicine

## 2024-07-24 ENCOUNTER — Ambulatory Visit: Payer: Self-pay | Admitting: Family Medicine

## 2024-07-24 ENCOUNTER — Ambulatory Visit (HOSPITAL_BASED_OUTPATIENT_CLINIC_OR_DEPARTMENT_OTHER)
Admission: RE | Admit: 2024-07-24 | Discharge: 2024-07-24 | Disposition: A | Discharge status: Home / Self Care | Source: Ambulatory Visit | Attending: Family Medicine | Admitting: Family Medicine

## 2024-07-24 VITALS — BP 130/60 | HR 64 | Temp 97.7°F | Resp 18 | Ht 61.0 in | Wt 139.8 lb

## 2024-07-24 DIAGNOSIS — J40 Bronchitis, not specified as acute or chronic: Secondary | ICD-10-CM | POA: Diagnosis not present

## 2024-07-24 DIAGNOSIS — Z96611 Presence of right artificial shoulder joint: Secondary | ICD-10-CM | POA: Diagnosis not present

## 2024-07-24 DIAGNOSIS — G2581 Restless legs syndrome: Secondary | ICD-10-CM

## 2024-07-24 DIAGNOSIS — E039 Hypothyroidism, unspecified: Secondary | ICD-10-CM

## 2024-07-24 DIAGNOSIS — S72002D Fracture of unspecified part of neck of left femur, subsequent encounter for closed fracture with routine healing: Secondary | ICD-10-CM

## 2024-07-24 DIAGNOSIS — J189 Pneumonia, unspecified organism: Secondary | ICD-10-CM | POA: Insufficient documentation

## 2024-07-24 DIAGNOSIS — R053 Chronic cough: Secondary | ICD-10-CM | POA: Diagnosis not present

## 2024-07-24 DIAGNOSIS — Z96642 Presence of left artificial hip joint: Secondary | ICD-10-CM | POA: Diagnosis not present

## 2024-07-24 DIAGNOSIS — Z96612 Presence of left artificial shoulder joint: Secondary | ICD-10-CM | POA: Diagnosis not present

## 2024-07-24 MED ORDER — ROPINIROLE HCL 0.25 MG PO TABS: ORAL_TABLET | ORAL | 2 refills | Status: AC

## 2024-07-24 MED ORDER — SYNTHROID 50 MCG PO TABS: 50.0000 ug | ORAL_TABLET | Freq: Every day | ORAL | 1 refills | Status: AC

## 2024-07-24 NOTE — Assessment & Plan Note (Signed)
 Start requip.

## 2024-07-24 NOTE — Progress Notes (Signed)
 Subjective:    Patient ID: Christina Mckinney, female    DOB: 1932/05/05, 89 y.o.   MRN: [id number]  Chief Complaint  Patient presents with   Hospitalization Follow-up    HPI Patient is in today for hosp f/u .   Discussed the use of AI scribe software for clinical note transcription with the patient, who gave verbal consent to proceed.  History of Present Illness Christina Mckinney is a 89 year old female who presents for post-operative follow-up after left hip fracture surgery.  She underwent surgery for a fractured neck of the left femur and was discharged from the rehab facility on September 6th. She has experienced significant improvement in pain, though some soreness and achiness persist. She has started home physical therapy and received home health care services starting yesterday.  Her current medications include levothyroxine , which was changed to a generic form at a dose of 75 micrograms, though she was previously on 50 micrograms of the brand name Synthroid . She also takes aspirin , metoprolol , pantoprazole , and occasionally hydrocodone  for pain, which she has reduced to half a tablet as needed. She experiences itching on her back, possibly related to hydrocodone  use.  She has a history of bladder spasms for which she was prescribed hyoscyamine  in the hospital, but she only takes it as needed. She also has a long-standing issue with phlegm production, which she manages with Mucinex  DM daily. She has a history of pneumonia and was treated with antibiotics in the past. She was told that a recent chest x-ray showed either atelectasis or pneumonia, and she tested positive for COVID-19 at the nursing home.  She experiences symptoms of restless legs, including a sensation of heat and numbness in her feet, particularly at night. She has been taking ropinirole  for these symptoms, which significantly impact her sleep and ability to stay overnight at her daughter's house.  She has a history of  dizziness and benign tremors, which have improved since seeing an ophthalmologist who identified issues with her eyes. She no longer experiences dizziness and can hold a cup of coffee without spilling it.  She reports frequent urination and occasional dark, odorous urine. A previous urine culture was normal.    Past Medical History:  Diagnosis Date   Anemia, unspecified    Anxiety and depression 09/18/2015   Anxiety state, unspecified    Chest pain    Chronic systolic CHF (congestive heart failure) (HCC) 08/12/2015   Depression with anxiety 06/02/2007   Qualifier: Diagnosis of  By: Georgian ROSALEA CHARM Lamar    Depressive disorder, not elsewhere classified    Diverticulitis    DVT (deep venous thrombosis) (HCC)    Dyspnea 07/22/2015   Edema 06/12/2014   Essential and other specified forms of tremor    GERD (gastroesophageal reflux disease)    Gout, unspecified    Internal hemorrhoids without mention of complication    Macular degeneration 08/07/2015   Osteoarthritis    Other abnormal glucose    Other and unspecified hyperlipidemia    Personal history of colonic polyps    Personal history of peptic ulcer disease    Pulmonary embolism (HCC)    Unspecified disorder of bladder    Unspecified essential hypertension    Unspecified hypothyroidism    Varicose veins     Past Surgical History:  Procedure Laterality Date   ABDOMINAL HYSTERECTOMY     CHOLECYSTECTOMY     ENDOVENOUS ABLATION SAPHENOUS VEIN W/ LASER  09-11-2012   left greater  saphenous vein   by Krystal Doing MD   EYE SURGERY     KNEE ARTHROSCOPY     right    NASAL SEPTUM SURGERY     SHOULDER SURGERY     TOTAL HIP ARTHROPLASTY Left 06/23/2024   Procedure: ARTHROPLASTY, HIP, TOTAL, ANTERIOR APPROACH;  Surgeon: Ernie Cough, MD;  Location: [facility];  Service: Orthopedics;  Laterality: Left;   TOTAL KNEE ARTHROPLASTY  09/2013   Right    Family History  Problem Relation Age of Onset   Other Mother        varicose veins   Hip  fracture Mother    Heart disease Father        CHF   Hyperlipidemia Father    Heart attack Father    Cancer Sister        stomach   Heart disease Sister    Heart disease Brother    Diabetes Daughter    Heart disease Daughter    Hyperlipidemia Daughter    Hypertension Daughter    Aneurysm Daughter    Colon cancer Neg Hx     Social History   Socioeconomic History   Marital status: Widowed    Spouse name: Not on file   Number of children: 3   Years of education: Not on file   Highest education level: Not on file  Occupational History   Occupation: Retired   Tobacco Use   Smoking status: Former    Current packs/day: 0.00    Types: Cigarettes    Start date: 11/12/1948    Quit date: 11/12/1988    Years since quitting: 35.7   Smokeless tobacco: Never  Vaping Use   Vaping status: Never Used  Substance and Sexual Activity   Alcohol use: Yes    Alcohol/week: 4.0 standard drinks of alcohol    Types: 4 Standard drinks or equivalent per week    Comment: occasional wine    Drug use: No   Sexual activity: Never    Comment: lives alone, no dietary restrictions, widowed  Other Topics Concern   Not on file  Social History Narrative   Not on file   Social Drivers of Health   Financial Resource Strain: Not on file  Food Insecurity: No Food Insecurity (06/23/2024)   Hunger Vital Sign    Worried About Running Out of Food in the Last Year: Never true    Ran Out of Food in the Last Year: Never true  Transportation Needs: No Transportation Needs (06/23/2024)   PRAPARE - Administrator, Civil Service (Medical): No    Lack of Transportation (Non-Medical): No  Physical Activity: Not on file  Stress: Not on file  Social Connections: Socially Isolated (06/23/2024)   Social Connection and Isolation Panel    Frequency of Communication with Friends and Family: More than three times a week    Frequency of Social Gatherings with Friends and Family: More than three times a week     Attends Religious Services: Never    Database administrator or Organizations: No    Attends Banker Meetings: Never    Marital Status: Widowed  Intimate Partner Violence: Not At Risk (06/23/2024)   Humiliation, Afraid, Rape, and Kick questionnaire    Fear of Current or Ex-Partner: No    Emotionally Abused: No    Physically Abused: No    Sexually Abused: No    Outpatient Medications Prior to Visit  Medication Sig Dispense Refill   albuterol  (VENTOLIN   HFA) 108 (90 Base) MCG/ACT inhaler USE 2 INHALATIONS EVERY 6 HOURS AS NEEDED FOR WHEEZING OR SHORTNESS OF BREATH 51 g 3   aspirin  81 MG chewable tablet Chew 1 tablet (81 mg total) by mouth 2 (two) times daily for 28 days. 56 tablet 0   erythromycin  ophthalmic ointment Place 1 Application into both eyes at bedtime.     febuxostat  (ULORIC ) 40 MG tablet Take 1 tablet (40 mg total) by mouth daily. 90 tablet 1   feeding supplement (ENSURE PLUS HIGH PROTEIN) LIQD Take 237 mLs by mouth 2 (two) times daily between meals.     furosemide  (LASIX ) 40 MG tablet Take 0.5 tablets (20 mg total) by mouth daily.     hyoscyamine  (ANASPAZ ) 0.125 MG TBDP disintergrating tablet Place 1 tablet (0.125 mg total) under the tongue every 6 (six) hours as needed for bladder spasms. 20 tablet 0   losartan  (COZAAR ) 25 MG tablet Take 1 tablet (25 mg total) by mouth daily. 90 tablet 0   methocarbamol  (ROBAXIN ) 500 MG tablet Take 1 tablet (500 mg total) by mouth every 6 (six) hours as needed for muscle spasms (thigh pain). 40 tablet 2   metoprolol  succinate (TOPROL -XL) 100 MG 24 hr tablet Take 1 tablet (100 mg total) by mouth daily. Take with or immediately following a meal 90 tablet 0   Olopatadine  HCl (PATADAY  OP) Apply 1 drop to eye daily.     ondansetron  (ZOFRAN ) 4 MG tablet Take 1 tablet (4 mg total) by mouth every 6 (six) hours as needed for nausea. 20 tablet 0   oxyCODONE  (OXY IR/ROXICODONE ) 5 MG immediate release tablet Take 0.5-1 tablets (2.5-5 mg total) by  mouth every 6 (six) hours as needed for severe pain (pain score 7-10). 42 tablet 0   pantoprazole  (PROTONIX ) 40 MG tablet Take 1 tablet (40 mg total) by mouth 2 (two) times daily before a meal. 180 tablet 0   polyethylene glycol (MIRALAX  / GLYCOLAX ) 17 g packet Take 17 g by mouth daily as needed for moderate constipation.     senna (SENOKOT) 8.6 MG TABS tablet Take 2 tablets (17.2 mg total) by mouth at bedtime.     simethicone  (MYLICON) 80 MG chewable tablet Chew 1 tablet (80 mg total) by mouth every 6 (six) hours as needed for flatulence.     VEVYE  0.1 % SOLN Place 1 drop into both eyes in the morning and at bedtime.     SYNTHROID  50 MCG tablet Take 1 tablet (50 mcg total) by mouth daily before breakfast. 90 tablet 1   No facility-administered medications prior to visit.    Allergies  Allergen Reactions   Simvastatin  Other (See Comments)    Hip pain   Crestor  [Rosuvastatin ]     Left hip pain   Lipitor [Atorvastatin ]     Pain in hips   Red Dye #40 (Allura Red)     Hip pain   Statins Other (See Comments)    Muscle aches, could not walk  Muscle aches, could not walk, Muscle aches, could not walk    Review of Systems  Constitutional:  Negative for fever and malaise/fatigue.  HENT:  Negative for congestion.   Eyes:  Negative for blurred vision.  Respiratory:  Positive for cough and sputum production. Negative for shortness of breath.   Cardiovascular:  Negative for chest pain, palpitations and leg swelling.  Gastrointestinal:  Negative for abdominal pain, blood in stool and nausea.  Genitourinary:  Positive for frequency. Negative for dysuria.  Musculoskeletal:  Positive for joint pain. Negative for falls.  Skin:  Negative for rash.  Neurological:  Negative for dizziness, loss of consciousness and headaches.  Endo/Heme/Allergies:  Negative for environmental allergies.  Psychiatric/Behavioral:  Negative for depression. The patient is not nervous/anxious.        Objective:     Physical Exam Vitals and nursing note reviewed.  Constitutional:      General: She is not in acute distress.    Appearance: Normal appearance. She is well-developed.  HENT:     Head: Normocephalic and atraumatic.  Eyes:     General: No scleral icterus.       Right eye: No discharge.        Left eye: No discharge.  Cardiovascular:     Rate and Rhythm: Normal rate and regular rhythm.     Heart sounds: No murmur heard. Pulmonary:     Effort: Pulmonary effort is normal. No respiratory distress.     Breath sounds: Normal breath sounds.  Musculoskeletal:        General: Tenderness present. Normal range of motion.     Cervical back: Normal range of motion and neck supple.     Right lower leg: No edema.     Left lower leg: Tenderness present. No edema.     Comments: Walking with walker  Skin:    General: Skin is warm and dry.  Neurological:     Mental Status: She is alert and oriented to person, place, and time.  Psychiatric:        Mood and Affect: Mood normal.        Behavior: Behavior normal.        Thought Content: Thought content normal.        Judgment: Judgment normal.     BP 130/60 (BP Location: Right Arm, Patient Position: Sitting, Cuff Size: Normal)   Pulse 64   Temp 97.7 F (36.5 C) (Oral)   Resp 18   Ht 5' 1 (1.549 m)   Wt 139 lb 12.8 oz (63.4 kg)   SpO2 94%   BMI 26.41 kg/m  Wt Readings from Last 3 Encounters:  07/24/24 139 lb 12.8 oz (63.4 kg)  06/22/24 138 lb 3.2 oz (62.7 kg)  06/22/24 138 lb 3.2 oz (62.7 kg)    Diabetic Foot Exam - Simple   No data filed    Lab Results  Component Value Date   WBC 10.6 (H) 06/29/2024   HGB 10.0 (L) 06/29/2024   HCT 32.2 (L) 06/29/2024   PLT 299 06/29/2024   GLUCOSE 100 (H) 06/29/2024   CHOL 210 (H) 02/10/2024   TRIG 149.0 02/10/2024   HDL 55.30 02/10/2024   LDLDIRECT 104.0 12/18/2021   LDLCALC 125 (H) 02/10/2024   ALT 7 06/22/2024   AST 22 06/22/2024   NA 139 06/29/2024   K 4.0 06/29/2024   CL 106  06/29/2024   CREATININE 0.95 06/29/2024   BUN 23 06/29/2024   CO2 23 06/29/2024   TSH 3.09 06/22/2024   INR 1.0 07/21/2010   HGBA1C 5.8 02/10/2024    Lab Results  Component Value Date   TSH 3.09 06/22/2024   Lab Results  Component Value Date   WBC 10.6 (H) 06/29/2024   HGB 10.0 (L) 06/29/2024   HCT 32.2 (L) 06/29/2024   MCV 98.5 06/29/2024   PLT 299 06/29/2024   Lab Results  Component Value Date   NA 139 06/29/2024   K 4.0 06/29/2024   CO2 23 06/29/2024   GLUCOSE  100 (H) 06/29/2024   BUN 23 06/29/2024   CREATININE 0.95 06/29/2024   BILITOT 1.2 06/22/2024   ALKPHOS 79 06/22/2024   AST 22 06/22/2024   ALT 7 06/22/2024   PROT 7.0 06/22/2024   ALBUMIN 4.3 06/22/2024   CALCIUM  9.2 06/29/2024   ANIONGAP 10 06/29/2024   EGFR 46 10/28/2023   GFR 41.96 (L) 06/22/2024   Lab Results  Component Value Date   CHOL 210 (H) 02/10/2024   Lab Results  Component Value Date   HDL 55.30 02/10/2024   Lab Results  Component Value Date   LDLCALC 125 (H) 02/10/2024   Lab Results  Component Value Date   TRIG 149.0 02/10/2024   Lab Results  Component Value Date   CHOLHDL 4 02/10/2024   Lab Results  Component Value Date   HGBA1C 5.8 02/10/2024       Assessment & Plan:  Closed fracture of left hip with routine healing, subsequent encounter Assessment & Plan: Per ortho   Hypothyroidism, unspecified type -     Synthroid ; Take 1 tablet (50 mcg total) by mouth daily before breakfast.  Dispense: 90 tablet; Refill: 1  RLS (restless legs syndrome) Assessment & Plan: Start requip    Orders: -     rOPINIRole  HCl; 1 po at bedtime may increase to 0.5 po at bedtime  Dispense: 60 tablet; Refill: 2  Pneumonia due to infectious organism, unspecified laterality, unspecified part of lung Assessment & Plan: Recheck xray today  Orders: -     DG Chest 2 View; Future  S/P total left hip arthroplasty Assessment & Plan: Per ortho   Assessment and Plan Assessment &  Plan Fracture of neck of left femur, post-surgical   Post-surgical status following fracture of the neck of the left femur shows mostly resolved pain with some residual soreness. She is undergoing physical therapy at home with home health care support after being discharged from rehab on September 6th. Continue physical therapy at home. Monitor pain levels and adjust management as needed. Consider hydrocodone  for pain, with caution due to potential constipation and itching.  Chronic cough with sputum production, under evaluation for pneumonia   Chronic cough with sputum production may be related to previous pneumonia or atelectasis as seen on chest x-ray. She tested positive for COVID-19 at the nursing home. Concerns about ongoing mucus production and potential lung issues persist. Order repeat chest x-ray to evaluate current lung status. Consider pulmonologist referral if symptoms persist. Encourage increased fluid intake to aid mucus clearance.  Restless legs syndrome   Long-standing restless legs syndrome causes significant discomfort, especially at night, with symptoms of heat and numbness in feet and inability to keep still. Previously managed with ropinirole . Initiate ropinirole  at 0.25 mg. Increase dose by 0.25 mg every 3-4 days if symptoms persist, up to a maximum of 1 mg. Monitor for symptom relief and side effects.  Hypothyroidism   Currently on levothyroxine  75 mcg, but prefers brand name Synthroid  over generic. Thyroid  levels were normal prior to hospitalization, with no recent tests conducted. Switch from levothyroxine  to Synthroid  at 50 mcg daily.  Urinary frequency   Reports frequent urination with a normal recent urine culture. Previous UTI ruled out. Nocturia noted. Encourage increased water intake to ensure clear urine.    Samra Pesch R Lowne Chase, DO

## 2024-07-24 NOTE — Assessment & Plan Note (Signed)
 Per ortho.

## 2024-07-24 NOTE — Assessment & Plan Note (Signed)
 Recheck xray today

## 2024-07-27 DIAGNOSIS — U071 COVID-19: Secondary | ICD-10-CM | POA: Diagnosis not present

## 2024-07-27 DIAGNOSIS — J1282 Pneumonia due to coronavirus disease 2019: Secondary | ICD-10-CM | POA: Diagnosis not present

## 2024-07-27 DIAGNOSIS — I130 Hypertensive heart and chronic kidney disease with heart failure and stage 1 through stage 4 chronic kidney disease, or unspecified chronic kidney disease: Secondary | ICD-10-CM | POA: Diagnosis not present

## 2024-07-27 DIAGNOSIS — J440 Chronic obstructive pulmonary disease with acute lower respiratory infection: Secondary | ICD-10-CM | POA: Diagnosis not present

## 2024-07-27 DIAGNOSIS — S72002D Fracture of unspecified part of neck of left femur, subsequent encounter for closed fracture with routine healing: Secondary | ICD-10-CM | POA: Diagnosis not present

## 2024-07-27 DIAGNOSIS — Z96642 Presence of left artificial hip joint: Secondary | ICD-10-CM | POA: Diagnosis not present

## 2024-07-30 DIAGNOSIS — I5032 Chronic diastolic (congestive) heart failure: Secondary | ICD-10-CM | POA: Diagnosis not present

## 2024-07-30 DIAGNOSIS — Z96642 Presence of left artificial hip joint: Secondary | ICD-10-CM | POA: Diagnosis not present

## 2024-07-30 DIAGNOSIS — D631 Anemia in chronic kidney disease: Secondary | ICD-10-CM | POA: Diagnosis not present

## 2024-07-30 DIAGNOSIS — S72002D Fracture of unspecified part of neck of left femur, subsequent encounter for closed fracture with routine healing: Secondary | ICD-10-CM | POA: Diagnosis not present

## 2024-07-30 DIAGNOSIS — D509 Iron deficiency anemia, unspecified: Secondary | ICD-10-CM | POA: Diagnosis not present

## 2024-07-30 DIAGNOSIS — N1832 Chronic kidney disease, stage 3b: Secondary | ICD-10-CM | POA: Diagnosis not present

## 2024-07-30 DIAGNOSIS — D72829 Elevated white blood cell count, unspecified: Secondary | ICD-10-CM | POA: Diagnosis not present

## 2024-07-30 DIAGNOSIS — G250 Essential tremor: Secondary | ICD-10-CM | POA: Diagnosis not present

## 2024-07-30 DIAGNOSIS — J1282 Pneumonia due to coronavirus disease 2019: Secondary | ICD-10-CM | POA: Diagnosis not present

## 2024-07-30 DIAGNOSIS — I130 Hypertensive heart and chronic kidney disease with heart failure and stage 1 through stage 4 chronic kidney disease, or unspecified chronic kidney disease: Secondary | ICD-10-CM | POA: Diagnosis not present

## 2024-07-30 DIAGNOSIS — U071 COVID-19: Secondary | ICD-10-CM | POA: Diagnosis not present

## 2024-07-30 DIAGNOSIS — J440 Chronic obstructive pulmonary disease with acute lower respiratory infection: Secondary | ICD-10-CM | POA: Diagnosis not present

## 2024-08-03 DIAGNOSIS — S72002D Fracture of unspecified part of neck of left femur, subsequent encounter for closed fracture with routine healing: Secondary | ICD-10-CM | POA: Diagnosis not present

## 2024-08-03 DIAGNOSIS — U071 COVID-19: Secondary | ICD-10-CM | POA: Diagnosis not present

## 2024-08-03 DIAGNOSIS — Z96642 Presence of left artificial hip joint: Secondary | ICD-10-CM | POA: Diagnosis not present

## 2024-08-03 DIAGNOSIS — I130 Hypertensive heart and chronic kidney disease with heart failure and stage 1 through stage 4 chronic kidney disease, or unspecified chronic kidney disease: Secondary | ICD-10-CM | POA: Diagnosis not present

## 2024-08-03 DIAGNOSIS — J440 Chronic obstructive pulmonary disease with acute lower respiratory infection: Secondary | ICD-10-CM | POA: Diagnosis not present

## 2024-08-03 DIAGNOSIS — J1282 Pneumonia due to coronavirus disease 2019: Secondary | ICD-10-CM | POA: Diagnosis not present

## 2024-08-04 DIAGNOSIS — H16223 Keratoconjunctivitis sicca, not specified as Sjogren's, bilateral: Secondary | ICD-10-CM | POA: Diagnosis not present

## 2024-08-04 DIAGNOSIS — H04123 Dry eye syndrome of bilateral lacrimal glands: Secondary | ICD-10-CM | POA: Diagnosis not present

## 2024-08-04 DIAGNOSIS — H353131 Nonexudative age-related macular degeneration, bilateral, early dry stage: Secondary | ICD-10-CM | POA: Diagnosis not present

## 2024-08-05 DIAGNOSIS — Z96642 Presence of left artificial hip joint: Secondary | ICD-10-CM | POA: Diagnosis not present

## 2024-08-05 DIAGNOSIS — Z471 Aftercare following joint replacement surgery: Secondary | ICD-10-CM | POA: Diagnosis not present

## 2024-08-10 DIAGNOSIS — Z96642 Presence of left artificial hip joint: Secondary | ICD-10-CM | POA: Diagnosis not present

## 2024-08-10 DIAGNOSIS — I130 Hypertensive heart and chronic kidney disease with heart failure and stage 1 through stage 4 chronic kidney disease, or unspecified chronic kidney disease: Secondary | ICD-10-CM | POA: Diagnosis not present

## 2024-08-10 DIAGNOSIS — U071 COVID-19: Secondary | ICD-10-CM | POA: Diagnosis not present

## 2024-08-10 DIAGNOSIS — J440 Chronic obstructive pulmonary disease with acute lower respiratory infection: Secondary | ICD-10-CM | POA: Diagnosis not present

## 2024-08-10 DIAGNOSIS — J1282 Pneumonia due to coronavirus disease 2019: Secondary | ICD-10-CM | POA: Diagnosis not present

## 2024-08-10 DIAGNOSIS — S72002D Fracture of unspecified part of neck of left femur, subsequent encounter for closed fracture with routine healing: Secondary | ICD-10-CM | POA: Diagnosis not present

## 2024-08-21 DIAGNOSIS — Z96642 Presence of left artificial hip joint: Secondary | ICD-10-CM | POA: Diagnosis not present

## 2024-08-21 DIAGNOSIS — I2510 Atherosclerotic heart disease of native coronary artery without angina pectoris: Secondary | ICD-10-CM | POA: Diagnosis not present

## 2024-08-21 DIAGNOSIS — G250 Essential tremor: Secondary | ICD-10-CM | POA: Diagnosis not present

## 2024-08-21 DIAGNOSIS — M109 Gout, unspecified: Secondary | ICD-10-CM | POA: Diagnosis not present

## 2024-08-21 DIAGNOSIS — H04123 Dry eye syndrome of bilateral lacrimal glands: Secondary | ICD-10-CM | POA: Diagnosis not present

## 2024-08-21 DIAGNOSIS — H3530 Unspecified macular degeneration: Secondary | ICD-10-CM | POA: Diagnosis not present

## 2024-08-21 DIAGNOSIS — D509 Iron deficiency anemia, unspecified: Secondary | ICD-10-CM | POA: Diagnosis not present

## 2024-08-21 DIAGNOSIS — N1832 Chronic kidney disease, stage 3b: Secondary | ICD-10-CM | POA: Diagnosis not present

## 2024-08-21 DIAGNOSIS — G2581 Restless legs syndrome: Secondary | ICD-10-CM | POA: Diagnosis not present

## 2024-08-21 DIAGNOSIS — E039 Hypothyroidism, unspecified: Secondary | ICD-10-CM | POA: Diagnosis not present

## 2024-08-21 DIAGNOSIS — I471 Supraventricular tachycardia, unspecified: Secondary | ICD-10-CM | POA: Diagnosis not present

## 2024-08-21 DIAGNOSIS — I5032 Chronic diastolic (congestive) heart failure: Secondary | ICD-10-CM | POA: Diagnosis not present

## 2024-08-21 DIAGNOSIS — H8110 Benign paroxysmal vertigo, unspecified ear: Secondary | ICD-10-CM | POA: Diagnosis not present

## 2024-08-21 DIAGNOSIS — F419 Anxiety disorder, unspecified: Secondary | ICD-10-CM | POA: Diagnosis not present

## 2024-08-21 DIAGNOSIS — J440 Chronic obstructive pulmonary disease with acute lower respiratory infection: Secondary | ICD-10-CM | POA: Diagnosis not present

## 2024-08-21 DIAGNOSIS — I4510 Unspecified right bundle-branch block: Secondary | ICD-10-CM | POA: Diagnosis not present

## 2024-08-21 DIAGNOSIS — R7303 Prediabetes: Secondary | ICD-10-CM | POA: Diagnosis not present

## 2024-08-21 DIAGNOSIS — I130 Hypertensive heart and chronic kidney disease with heart failure and stage 1 through stage 4 chronic kidney disease, or unspecified chronic kidney disease: Secondary | ICD-10-CM | POA: Diagnosis not present

## 2024-08-21 DIAGNOSIS — D72829 Elevated white blood cell count, unspecified: Secondary | ICD-10-CM | POA: Diagnosis not present

## 2024-08-21 DIAGNOSIS — J1282 Pneumonia due to coronavirus disease 2019: Secondary | ICD-10-CM | POA: Diagnosis not present

## 2024-08-21 DIAGNOSIS — D631 Anemia in chronic kidney disease: Secondary | ICD-10-CM | POA: Diagnosis not present

## 2024-08-21 DIAGNOSIS — I700 Atherosclerosis of aorta: Secondary | ICD-10-CM | POA: Diagnosis not present

## 2024-08-21 DIAGNOSIS — K219 Gastro-esophageal reflux disease without esophagitis: Secondary | ICD-10-CM | POA: Diagnosis not present

## 2024-08-21 DIAGNOSIS — S72002D Fracture of unspecified part of neck of left femur, subsequent encounter for closed fracture with routine healing: Secondary | ICD-10-CM | POA: Diagnosis not present

## 2024-08-21 DIAGNOSIS — U071 COVID-19: Secondary | ICD-10-CM | POA: Diagnosis not present

## 2024-08-25 ENCOUNTER — Telehealth: Payer: Self-pay

## 2024-08-25 DIAGNOSIS — J440 Chronic obstructive pulmonary disease with acute lower respiratory infection: Secondary | ICD-10-CM | POA: Diagnosis not present

## 2024-08-25 DIAGNOSIS — U071 COVID-19: Secondary | ICD-10-CM | POA: Diagnosis not present

## 2024-08-25 DIAGNOSIS — S72002D Fracture of unspecified part of neck of left femur, subsequent encounter for closed fracture with routine healing: Secondary | ICD-10-CM | POA: Diagnosis not present

## 2024-08-25 DIAGNOSIS — I130 Hypertensive heart and chronic kidney disease with heart failure and stage 1 through stage 4 chronic kidney disease, or unspecified chronic kidney disease: Secondary | ICD-10-CM | POA: Diagnosis not present

## 2024-08-25 DIAGNOSIS — Z96642 Presence of left artificial hip joint: Secondary | ICD-10-CM | POA: Diagnosis not present

## 2024-08-25 DIAGNOSIS — J1282 Pneumonia due to coronavirus disease 2019: Secondary | ICD-10-CM | POA: Diagnosis not present

## 2024-08-25 NOTE — Telephone Encounter (Signed)
 Copied from CRM [id number]. Topic: Clinical - Home Health Verbal Orders >> Aug 25, 2024  3:47 PM Rea BROCKS wrote: Caller/Agency: Signe Risser Callback Number: [phone]  Service Requested:  Any new concerns about the patient? Patient is requesting discharge from home health services, she feels like she is doing fine, vitals wnl, patient had an episode of vomiting, she think she ate too late, didn't feel great but couldn't provide more details to [place].

## 2024-08-25 NOTE — Telephone Encounter (Signed)
 Okay to give orders to d/c home health?

## 2024-08-26 NOTE — Telephone Encounter (Signed)
 LMOM informing Signe okay to d/c home health services as requested.

## 2024-09-02 ENCOUNTER — Other Ambulatory Visit: Payer: Self-pay | Admitting: Family Medicine

## 2024-10-19 ENCOUNTER — Encounter: Payer: Self-pay | Admitting: *Deleted

## 2024-10-19 ENCOUNTER — Other Ambulatory Visit: Payer: Self-pay | Admitting: Family Medicine

## 2024-10-19 DIAGNOSIS — I10 Essential (primary) hypertension: Secondary | ICD-10-CM

## 2024-11-03 ENCOUNTER — Encounter: Payer: Self-pay | Admitting: *Deleted

## 2025-02-15 ENCOUNTER — Ambulatory Visit: Admitting: Family Medicine
